# Patient Record
Sex: Female | Born: 1940
Health system: Southern US, Community
[De-identification: ages and names within clinical notes are randomized; demographics above are authoritative.]

## PROBLEM LIST (undated history)

## (undated) DIAGNOSIS — E119 Type 2 diabetes mellitus without complications: Secondary | ICD-10-CM

## (undated) DIAGNOSIS — M519 Unspecified thoracic, thoracolumbar and lumbosacral intervertebral disc disorder: Secondary | ICD-10-CM

## (undated) DIAGNOSIS — I5030 Unspecified diastolic (congestive) heart failure: Secondary | ICD-10-CM

## (undated) DIAGNOSIS — I4892 Unspecified atrial flutter: Secondary | ICD-10-CM

## (undated) DIAGNOSIS — I2510 Atherosclerotic heart disease of native coronary artery without angina pectoris: Secondary | ICD-10-CM

## (undated) DIAGNOSIS — E785 Hyperlipidemia, unspecified: Secondary | ICD-10-CM

## (undated) DIAGNOSIS — I10 Essential (primary) hypertension: Secondary | ICD-10-CM

## (undated) DIAGNOSIS — I214 Non-ST elevation (NSTEMI) myocardial infarction: Secondary | ICD-10-CM

## (undated) HISTORY — DX: Type 2 diabetes mellitus without complications: E11.9

## (undated) HISTORY — PX: OTHER SURGICAL HISTORY: SHX169

---

## 1998-05-16 ENCOUNTER — Emergency Department (HOSPITAL_COMMUNITY): Admission: EM | Admit: 1998-05-16 | Discharge: 1998-05-16 | Payer: Self-pay

## 1999-09-13 ENCOUNTER — Encounter: Payer: Self-pay | Admitting: Internal Medicine

## 1999-09-13 ENCOUNTER — Ambulatory Visit (HOSPITAL_COMMUNITY): Admission: RE | Admit: 1999-09-13 | Discharge: 1999-09-13 | Payer: Self-pay | Admitting: Internal Medicine

## 1999-11-06 ENCOUNTER — Encounter: Payer: Self-pay | Admitting: Emergency Medicine

## 1999-11-06 ENCOUNTER — Emergency Department (HOSPITAL_COMMUNITY): Admission: EM | Admit: 1999-11-06 | Discharge: 1999-11-06 | Payer: Self-pay | Admitting: Emergency Medicine

## 2002-08-20 ENCOUNTER — Emergency Department (HOSPITAL_COMMUNITY): Admission: EM | Admit: 2002-08-20 | Discharge: 2002-08-20 | Payer: Self-pay | Admitting: *Deleted

## 2002-09-03 ENCOUNTER — Ambulatory Visit (HOSPITAL_COMMUNITY): Admission: RE | Admit: 2002-09-03 | Discharge: 2002-09-03 | Payer: Self-pay | Admitting: *Deleted

## 2002-09-03 ENCOUNTER — Encounter: Payer: Self-pay | Admitting: *Deleted

## 2004-05-12 ENCOUNTER — Emergency Department (HOSPITAL_COMMUNITY): Admission: EM | Admit: 2004-05-12 | Discharge: 2004-05-12 | Payer: Self-pay | Admitting: Family Medicine

## 2010-04-13 ENCOUNTER — Emergency Department (HOSPITAL_COMMUNITY): Admission: EM | Admit: 2010-04-13 | Discharge: 2010-04-14 | Payer: Self-pay | Admitting: Emergency Medicine

## 2010-11-09 ENCOUNTER — Other Ambulatory Visit: Payer: Self-pay | Admitting: Pulmonary Disease

## 2010-11-09 ENCOUNTER — Other Ambulatory Visit: Payer: Self-pay | Admitting: *Deleted

## 2010-11-09 DIAGNOSIS — Z1231 Encounter for screening mammogram for malignant neoplasm of breast: Secondary | ICD-10-CM

## 2010-11-15 ENCOUNTER — Ambulatory Visit
Admission: RE | Admit: 2010-11-15 | Discharge: 2010-11-15 | Disposition: A | Discharge status: Home / Self Care | Payer: Medicare PPO | Source: Ambulatory Visit | Attending: *Deleted | Admitting: *Deleted

## 2010-11-15 DIAGNOSIS — Z1231 Encounter for screening mammogram for malignant neoplasm of breast: Secondary | ICD-10-CM

## 2010-11-18 ENCOUNTER — Other Ambulatory Visit: Payer: Self-pay | Admitting: Pulmonary Disease

## 2010-11-18 DIAGNOSIS — R928 Other abnormal and inconclusive findings on diagnostic imaging of breast: Secondary | ICD-10-CM

## 2010-11-30 ENCOUNTER — Ambulatory Visit
Admission: RE | Admit: 2010-11-30 | Discharge: 2010-11-30 | Disposition: A | Discharge status: Home / Self Care | Payer: Medicare PPO | Source: Ambulatory Visit | Attending: Pulmonary Disease | Admitting: Pulmonary Disease

## 2010-11-30 DIAGNOSIS — R928 Other abnormal and inconclusive findings on diagnostic imaging of breast: Secondary | ICD-10-CM

## 2011-05-02 ENCOUNTER — Other Ambulatory Visit: Payer: Self-pay

## 2011-05-02 ENCOUNTER — Emergency Department (HOSPITAL_COMMUNITY): Payer: Medicare HMO

## 2011-05-02 ENCOUNTER — Inpatient Hospital Stay (HOSPITAL_COMMUNITY)
Admission: EM | Admit: 2011-05-02 | Discharge: 2011-05-04 | DRG: 281 | Disposition: A | Discharge status: Home / Self Care | Payer: Medicare HMO | Attending: Cardiology | Admitting: Cardiology

## 2011-05-02 ENCOUNTER — Encounter: Payer: Self-pay | Admitting: *Deleted

## 2011-05-02 DIAGNOSIS — M519 Unspecified thoracic, thoracolumbar and lumbosacral intervertebral disc disorder: Secondary | ICD-10-CM | POA: Insufficient documentation

## 2011-05-02 DIAGNOSIS — I2510 Atherosclerotic heart disease of native coronary artery without angina pectoris: Secondary | ICD-10-CM

## 2011-05-02 DIAGNOSIS — I10 Essential (primary) hypertension: Secondary | ICD-10-CM

## 2011-05-02 DIAGNOSIS — Z6841 Body Mass Index (BMI) 40.0 and over, adult: Secondary | ICD-10-CM

## 2011-05-02 DIAGNOSIS — Z23 Encounter for immunization: Secondary | ICD-10-CM

## 2011-05-02 DIAGNOSIS — I119 Hypertensive heart disease without heart failure: Secondary | ICD-10-CM | POA: Diagnosis present

## 2011-05-02 DIAGNOSIS — E785 Hyperlipidemia, unspecified: Secondary | ICD-10-CM | POA: Insufficient documentation

## 2011-05-02 DIAGNOSIS — Z79899 Other long term (current) drug therapy: Secondary | ICD-10-CM

## 2011-05-02 DIAGNOSIS — Z880 Allergy status to penicillin: Secondary | ICD-10-CM

## 2011-05-02 DIAGNOSIS — Z823 Family history of stroke: Secondary | ICD-10-CM

## 2011-05-02 DIAGNOSIS — K219 Gastro-esophageal reflux disease without esophagitis: Secondary | ICD-10-CM | POA: Diagnosis present

## 2011-05-02 DIAGNOSIS — I2582 Chronic total occlusion of coronary artery: Secondary | ICD-10-CM | POA: Diagnosis present

## 2011-05-02 DIAGNOSIS — I214 Non-ST elevation (NSTEMI) myocardial infarction: Secondary | ICD-10-CM

## 2011-05-02 DIAGNOSIS — Z9119 Patient's noncompliance with other medical treatment and regimen: Secondary | ICD-10-CM

## 2011-05-02 DIAGNOSIS — Z7982 Long term (current) use of aspirin: Secondary | ICD-10-CM

## 2011-05-02 DIAGNOSIS — Z91199 Patient's noncompliance with other medical treatment and regimen due to unspecified reason: Secondary | ICD-10-CM

## 2011-05-02 DIAGNOSIS — Z87891 Personal history of nicotine dependence: Secondary | ICD-10-CM

## 2011-05-02 DIAGNOSIS — E66813 Obesity, class 3: Secondary | ICD-10-CM

## 2011-05-02 HISTORY — DX: Essential (primary) hypertension: I10

## 2011-05-02 HISTORY — DX: Morbid (severe) obesity due to excess calories: E66.01

## 2011-05-02 HISTORY — DX: Non-ST elevation (NSTEMI) myocardial infarction: I21.4

## 2011-05-02 HISTORY — DX: Hyperlipidemia, unspecified: E78.5

## 2011-05-02 HISTORY — DX: Atherosclerotic heart disease of native coronary artery without angina pectoris: I25.10

## 2011-05-02 HISTORY — DX: Unspecified thoracic, thoracolumbar and lumbosacral intervertebral disc disorder: M51.9

## 2011-05-02 HISTORY — DX: Obesity, class 3: E66.813

## 2011-05-02 LAB — DIFFERENTIAL
Basophils Relative: 1 % (ref 0–1)
Eosinophils Absolute: 0.2 10*3/uL (ref 0.0–0.7)
Monocytes Relative: 10 % (ref 3–12)
Neutrophils Relative %: 66 % (ref 43–77)

## 2011-05-02 LAB — CBC
MCH: 29.4 pg (ref 26.0–34.0)
MCHC: 32.5 g/dL (ref 30.0–36.0)
Platelets: 356 10*3/uL (ref 150–400)

## 2011-05-02 LAB — APTT: aPTT: 34 seconds (ref 24–37)

## 2011-05-02 LAB — TROPONIN I
Troponin I: 2.25 ng/mL (ref <0.30)
Troponin I: 2.2 ng/mL (ref <0.30)

## 2011-05-02 LAB — PRO B NATRIURETIC PEPTIDE: Pro B Natriuretic peptide (BNP): 1282 pg/mL — ABNORMAL HIGH (ref 0–125)

## 2011-05-02 LAB — POCT I-STAT TROPONIN I

## 2011-05-02 LAB — MRSA PCR SCREENING: MRSA by PCR: NEGATIVE

## 2011-05-02 LAB — POCT I-STAT, CHEM 8
Chloride: 106 mEq/L (ref 96–112)
HCT: 39 % (ref 36.0–46.0)
Potassium: 6.3 mEq/L (ref 3.5–5.1)

## 2011-05-02 LAB — LIPASE, BLOOD: Lipase: 11 U/L (ref 11–59)

## 2011-05-02 MED ORDER — AMLODIPINE BESYLATE 5 MG PO TABS
5.0000 mg | ORAL_TABLET | Freq: Every day | ORAL | Status: DC
  Administered 2011-05-03: 5 mg via ORAL
  Filled 2011-05-02: qty 1

## 2011-05-02 MED ORDER — ASPIRIN 81 MG PO CHEW
324.0000 mg | CHEWABLE_TABLET | ORAL | Status: AC
Start: 2011-05-02 — End: 2011-05-02
  Administered 2011-05-02: 324 mg via ORAL

## 2011-05-02 MED ORDER — HYDROCHLOROTHIAZIDE 12.5 MG PO CAPS
12.5000 mg | ORAL_CAPSULE | Freq: Once | ORAL | Status: DC
  Filled 2011-05-02: qty 1

## 2011-05-02 MED ORDER — NITROGLYCERIN 0.4 MG SL SUBL
SUBLINGUAL_TABLET | SUBLINGUAL | Status: AC
  Administered 2011-05-02: 17:00:00 via SUBLINGUAL
  Filled 2011-05-02: qty 25

## 2011-05-02 MED ORDER — OLMESARTAN MEDOXOMIL 40 MG PO TABS
40.0000 mg | ORAL_TABLET | Freq: Every day | ORAL | Status: DC
  Filled 2011-05-02: qty 1

## 2011-05-02 MED ORDER — ONDANSETRON HCL 4 MG/2ML IJ SOLN: 4.0000 mg | Freq: Four times a day (QID) | INTRAMUSCULAR | Status: DC | PRN

## 2011-05-02 MED ORDER — DIAZEPAM 5 MG PO TABS
10.0000 mg | ORAL_TABLET | ORAL | Status: AC
  Administered 2011-05-03: 10 mg via ORAL
  Filled 2011-05-02: qty 2

## 2011-05-02 MED ORDER — GI COCKTAIL ~~LOC~~
30.0000 mL | Freq: Once | ORAL | Status: AC
  Administered 2011-05-02: 30 mL via ORAL
  Filled 2011-05-02: qty 30

## 2011-05-02 MED ORDER — NITROGLYCERIN IN D5W 200-5 MCG/ML-% IV SOLN
5.0000 ug/min | INTRAVENOUS | Status: DC
  Administered 2011-05-02: 5 ug/min via INTRAVENOUS
  Filled 2011-05-02: qty 250

## 2011-05-02 MED ORDER — NITROGLYCERIN 0.4 MG SL SUBL
0.4000 mg | SUBLINGUAL_TABLET | SUBLINGUAL | Status: DC | PRN
  Administered 2011-05-02 (×2): 0.4 mg via SUBLINGUAL

## 2011-05-02 MED ORDER — ASPIRIN EC 81 MG PO TBEC
81.0000 mg | DELAYED_RELEASE_TABLET | Freq: Every day | ORAL | Status: DC
  Administered 2011-05-04: 81 mg via ORAL
  Filled 2011-05-02 (×2): qty 1

## 2011-05-02 MED ORDER — HYDROCHLOROTHIAZIDE 25 MG PO TABS
25.0000 mg | ORAL_TABLET | Freq: Every day | ORAL | Status: DC
  Administered 2011-05-04: 25 mg via ORAL
  Filled 2011-05-02 (×2): qty 1

## 2011-05-02 MED ORDER — ASPIRIN 81 MG PO CHEW
324.0000 mg | CHEWABLE_TABLET | Freq: Once | ORAL | Status: AC
  Administered 2011-05-02: 243 mg via ORAL
  Filled 2011-05-02: qty 4

## 2011-05-02 MED ORDER — METOPROLOL TARTRATE 50 MG PO TABS
50.0000 mg | ORAL_TABLET | Freq: Two times a day (BID) | ORAL | Status: DC
  Administered 2011-05-02 – 2011-05-04 (×4): 50 mg via ORAL
  Filled 2011-05-02 (×6): qty 1

## 2011-05-02 MED ORDER — ACETAMINOPHEN 325 MG PO TABS: 650.0000 mg | ORAL_TABLET | ORAL | Status: DC | PRN

## 2011-05-02 MED ORDER — NITROGLYCERIN 0.4 MG SL SUBL: 0.4000 mg | SUBLINGUAL_TABLET | SUBLINGUAL | Status: DC | PRN

## 2011-05-02 MED ORDER — HEPARIN (PORCINE) IN NACL 100-0.45 UNIT/ML-% IJ SOLN
1600.0000 [IU]/h | INTRAMUSCULAR | Status: DC
  Administered 2011-05-02: 1200 [IU]/h via INTRAVENOUS
  Administered 2011-05-03: 1600 [IU]/h via INTRAVENOUS
  Filled 2011-05-02 (×4): qty 250

## 2011-05-02 MED ORDER — ASPIRIN 300 MG RE SUPP
300.0000 mg | RECTAL | Status: AC
  Filled 2011-05-02: qty 1

## 2011-05-02 MED ORDER — LABETALOL HCL 5 MG/ML IV SOLN
10.0000 mg | Freq: Once | INTRAVENOUS | Status: DC
  Filled 2011-05-02: qty 4

## 2011-05-02 MED ORDER — SODIUM CHLORIDE 0.9 % IV SOLN: INTRAVENOUS | Status: DC

## 2011-05-02 MED ORDER — ASPIRIN 81 MG PO CHEW
324.0000 mg | CHEWABLE_TABLET | ORAL | Status: AC
  Administered 2011-05-03: 324 mg via ORAL
  Filled 2011-05-02: qty 4

## 2011-05-02 MED ORDER — HEPARIN BOLUS VIA INFUSION
4000.0000 [IU] | INTRAVENOUS | Status: AC
  Administered 2011-05-02: 4000 [IU] via INTRAVENOUS
  Filled 2011-05-02: qty 4000

## 2011-05-02 MED ORDER — ROSUVASTATIN CALCIUM 20 MG PO TABS
20.0000 mg | ORAL_TABLET | Freq: Every day | ORAL | Status: DC
  Administered 2011-05-03 – 2011-05-04 (×2): 20 mg via ORAL
  Filled 2011-05-02 (×2): qty 1

## 2011-05-02 MED ORDER — AMLODIPINE BESYLATE 5 MG PO TABS
5.0000 mg | ORAL_TABLET | Freq: Once | ORAL | Status: DC
  Filled 2011-05-02: qty 1

## 2011-05-02 NOTE — ED Notes (Signed)
Patient stable.  Calling [facility] and [facility] and giving report.  Family at bedside and informed of which room 2604 the patient will be going to at [facility].  They state understanding.

## 2011-05-02 NOTE — ED Notes (Signed)
Pt states after she eats or ambulate around the house she begins feel  Nauseated and burning from her abdomin to her throat. Pt denies sob or chest pain

## 2011-05-02 NOTE — ED Notes (Signed)
2 nitroglycerin SL given. Dr. Alto Denver ordered to hold drip as of now since BP is 174/66 and has come down significantly.

## 2011-05-02 NOTE — ED Notes (Signed)
Patient in room awaiting a bed at [facility] for report to called to and for [facility]link to be called to come pick up patient and transport the patient.

## 2011-05-02 NOTE — H&P (Addendum)
Admit date: 05/02/2011  Referring Physician: [facility]  Primary Cardiologist: Dr. Viann Fish  Chief complaint/reason for admission: Chest pain and uncontrolled hypertension  HPI:   This 70 year old female has a long-standing history of hypertensive heart disease but has been noncompliant with her medication. She is now seeing Dr. Petra Kuba but has not taken her medicines and over one week. She presented to the emergency room with a several day history of mid sternal chest discomfort that radiates up into her neck. It is described as pressure and is typically relieved with rest. He usually will occur with activity or with exertion but did occur at rest last evening. She denies PND, orthopnea, syncope, or claudication. She quit smoking about one month ago. She was seen in the emergency room and had significant inferolateral T wave inversions with some mild ST elevation and also had positive troponins. She was previously treated with Tribenzor for BP but for unclear reasons stopped taking it last week.   Past Medical History  Diagnosis Date  . Hypertension   . Non-ST elevation myocardial infarction (NSTEMI), initial episode of care 05/02/2011  . Obesity (BMI 30-39.9) 05/02/2011  . Hyperlipidemia   . Lumbar disc disease     Past Surgical History  Procedure Date  . None   .  Allergies: is allergic to penicillins.   Family history:   family history includes Cancer in her father and Stroke in her mother.   Social history:  Reports that she quit smoking about 4 weeks ago. Her smoking use included Cigarettes. She has a 25 pack-year smoking history. She has never used smokeless tobacco. She reports that she does not drink alcohol or use illicit drugs. She has been divorced and was been remarried for 18 years. She has a daughter that lives with her currently. She is retired from the [employer].    Review of Systems:  She has been obese for the past several  years. She complains of difficulty with a sty  involving her left eye. She thought that she had reflux earlier but has not had any GI problems recently. She denies constipation. She has significant urinary frequency. She has severe low back pain and has been diagnosed with lumbar disease. She also complains of pain involving both knees and has been using ibuprofen. No history of stroke.   Other than as noted above the remaining review of systems is unremarkable.   Physical Exam: Blood pressure 187/68, pulse 70, temperature 98.2 F (36.8 C), temperature source Oral, resp. rate 17, weight 120.203 kg (265 lb), SpO2 95.00%.    General Appearance:  pleasant obese black female who is currently in no acute distress   Head:    Normocephalic, without obvious abnormality, atraumatic  Eyes:    PERRL, conjunctiva/corneas clear, EOM's intact, fundi    benign, both eyes       Nose:   Nares normal, septum midline, mucosa normal, no drainage   or sinus tenderness  Throat:   Lips, mucosa, and tongue normal; teeth and gums normal  Neck:   Supple,no masses, no thyromegaly, no JVD, no carotid    bruits or JVD  Lungs:     Clear to auscultation bilaterally, respirations unlabored  Chest wall:    No tenderness or deformity  Heart:    Regular rate and rhythm, S1 and S2 normal, no murmur, rub   or gallop  Abdomen:     Soft, non-tender, bowel sounds normal,    no masses, no organomegaly  no aneurysm noted  Rectal:    Normal tone, normal prostate, no masses or tenderness;   guaiac negative stool  Extremities:   Extremities normal, atraumatic,  1+ edema   Pulses:   Femoral and distal pulses 2+, No bruits  Skin:   Skin color, texture, turgor normal, no rashes or lesions  Lymph nodes:   Cervical, supraclavicular, and axillary nodes normal  Neurologic:   CNII-XII intact. Normal strength, sensation and reflexes      throughout   Labs:  Lab Results  Component Value Date   WBC 9.3 05/02/2011   HGB 12.3 05/02/2011    HCT 37.9 05/02/2011   MCV 90.5 05/02/2011   PLT 356 05/02/2011    Lab 05/02/11 1630 05/02/11 1557  NA -- 140  K 4.7 --  CL -- 106  CO2 -- --  BUN -- 26*  CREATININE -- 1.00  CALCIUM -- --  PROT -- --  BILITOT -- --  ALKPHOS -- --  ALT -- --  AST -- --  GLUCOSE -- 102*   Lab Results  Component Value Date   TROPONINI 2.20* 05/02/2011    EKG   she is in sinus rhythm and has significant T wave inversions in the inferolateral leads with voltage criteria for LVH.  ASSESSMENT AND PLAN:  1. Non-ST elevation myocardial infarction (NSTEMI), initial episode of care (05/02/2011)      The patient has a suggestive history of unstable angina and has EKG changes as well as abnormal troponins. She will need to be admitted to the hospital and started on intravenous heparin. Because of the abnormal troponins, she will need to undergo cardiac catheterization 2. Hypertensive heart disease without CHF   Assessment: Her blood pressure is currently uncontrolled and she may have difficulty with her blood pressure because of the nonsteroidal anti-inflammatories. She will be restarted on her blood pressure medications. 3. Obesity 4. Lumbar disc disease 5. Noncompliance  RECOMMENDATIONS  Patient will be transferred to [facility] to the step down unit. She will be placed on intravenous heparin and begun on intravenous nitroglycerin. She will have serial cardiac enzymes. Plan cardiac catheterization tomorrow. Cardiac catheterization was discussed with the patient fully including risks of myocardial infarction, death, stroke, bleeding, arrhythmia, dye allergy, renal insufficiency or bleeding.  The patient understands and is willing to proceed.    Signed: Darden Palmer. MD [facility] 05/02/2011, 7:35 PM    Patient seen and examined.  No interval change in history and exam since yesterday.  Stable for procedure.  Darden Palmer. MD [facility]  05/03/2011

## 2011-05-02 NOTE — ED Notes (Signed)
Heparin bolus and gtt verified with carneal rn

## 2011-05-02 NOTE — ED Notes (Signed)
Cardiology saw the patient at bedside.

## 2011-05-02 NOTE — ED Notes (Signed)
Report called to [facility] and [facility].   Both state understanding of patient.  Patient stable and in room with family at this time.

## 2011-05-02 NOTE — ED Notes (Signed)
Nitro titrated to per min/ per Dr. Alto Denver after seeing 195/74 BP. In NAD and still in no pain at this time.

## 2011-05-02 NOTE — ED Provider Notes (Signed)
History     CSN: [id number] Arrival date & time: 05/02/2011 12:46 PM   First MD Initiated Contact with Patient 05/02/11 1458      Chief Complaint  Patient presents with  . Gastrophageal Reflux    (Consider location/radiation/quality/duration/timing/severity/associated sxs/prior treatment) HPI The patient is a 70 year old female who presents today complaining of having two-minute episodes of epigastric discomfort that radiates to her chest. These are associated with lightheadedness. They resolve with rest. The patient has been having these for the past week. Her blood pressure is elevated to 200 systolic on presentation. She states she has not been taking her blood pressure medication for least one week. The patient is a former smoker and has history of hypertension but denies any history of diabetes or prior coronary artery disease. She has no pain and is not experiencing any symptoms at this time. She has not seen her regular doctor for this. She denies any nausea or vomiting. She has not had a recent cough or fevers. Patient has no history of blood clots. She thought that this was likely indigestion but as it kept happening she thought she should get it checked out. Past Medical History  Diagnosis Date  . Hypertension     History reviewed. No pertinent past surgical history.  Family History  Problem Relation Age of Onset  . Stroke Mother   . Cancer Father     History  Substance Use Topics  . Smoking status: Former Games developer  . Smokeless tobacco: Not on file  . Alcohol Use: No    OB History    Grav Para Term Preterm Abortions TAB SAB Ect Mult Living                  Review of Systems  Constitutional: Negative.   HENT: Negative.   Respiratory: Negative.   Cardiovascular: Negative.   Gastrointestinal: Positive for abdominal pain.  Genitourinary: Negative.   Musculoskeletal: Negative.   Skin: Negative.   Neurological: Positive for light-headedness.  Hematological:  Negative.   Psychiatric/Behavioral: Negative.   All other systems reviewed and are negative.    Allergies  Penicillins  Home Medications   Current Outpatient Rx  Name Route Sig Dispense Refill  . IBUPROFEN 200 MG PO TABS Oral Take 400 mg by mouth 2 (two) times daily as needed. For pain     . NAPROXEN SODIUM 220 MG PO TABS Oral Take 440 mg by mouth 2 (two) times daily as needed.      Marland Kitchen NIACIN (ANTIHYPERLIPIDEMIC) 500 MG PO TBCR Oral Take 500 mg by mouth at bedtime.      Marland Kitchen OLMESARTAN-AMLODIPINE-HCTZ 40-5-12.5 MG PO TABS Oral Take 1 tablet by mouth daily.        BP 187/68  Pulse 70  Temp(Src) 98.2 F (36.8 C) (Oral)  Resp 17  Wt 265 lb (120.203 kg)  SpO2 95%  Physical Exam  Nursing note and vitals reviewed. Constitutional: She is oriented to person, place, and time. She appears well-developed and well-nourished. No distress.  HENT:  Head: Normocephalic and atraumatic.  Eyes: Conjunctivae and EOM are normal. Pupils are equal, round, and reactive to light.  Neck: Normal range of motion.  Cardiovascular: Normal rate, regular rhythm, normal heart sounds and intact distal pulses.  Exam reveals no gallop and no friction rub.   No murmur heard. Pulmonary/Chest: Effort normal. No respiratory distress. She has no wheezes. She has no rales.  Abdominal: Soft. Bowel sounds are normal. She exhibits no distension. There is no tenderness.  There is no rebound and no guarding.  Musculoskeletal: Normal range of motion. She exhibits no edema and no tenderness.  Neurological: She is alert and oriented to person, place, and time. No cranial nerve deficit. She exhibits normal muscle tone. Coordination normal.  Skin: Skin is warm and dry. No rash noted. She is not diaphoretic.  Psychiatric: She has a normal mood and affect.    ED Course  Procedures (including critical care time)   Date: 05/02/2011  Rate: 73  Rhythm: normal sinus rhythm  QRS Axis: normal  Intervals: normal  ST/T Wave  abnormalities: Patient had minimal ST depression noted in 1 and aVL, there was very minimal ST elevation in leads V2 V3 and aVF that appeared consistent with repolarization.  Conduction Disutrbances:none  Narrative Interpretation:   Old EKG Reviewed: none available  Labs Reviewed  POCT I-STAT, CHEM 8 - Abnormal; Notable for the following:    Potassium 6.3 (*)    BUN 26 (*)    Glucose, Bld 102 (*)    Calcium, Ion 1.07 (*)    All other components within normal limits  POCT I-STAT TROPONIN I - Abnormal; Notable for the following:    Troponin i, poc 1.70 (*)    All other components within normal limits  PRO B NATRIURETIC PEPTIDE - Abnormal; Notable for the following:    BNP, POC 1282.0 (*)    All other components within normal limits  TROPONIN I - Abnormal; Notable for the following:    Troponin I 2.25 (*)    All other components within normal limits  LIPASE, BLOOD  POTASSIUM  CBC  DIFFERENTIAL  URINALYSIS, ROUTINE W REFLEX MICROSCOPIC  URINE CULTURE  I-STAT, CHEM 8  I-STAT TROPONIN I  PROTIME-INR  APTT  HEPARIN LEVEL (UNFRACTIONATED)   Dg Chest 2 View  05/02/2011  *RADIOLOGY REPORT*  Clinical Data: Epigastric pain  CHEST - 2 VIEW  Comparison: None available  Findings: Mild cardiac enlargement with central vascular congestion.  Streaky left midlung atelectasis versus scarring. Negative for pneumonia, collapse, consolidation, effusion or pneumothorax.  Trachea is midline.  Atherosclerosis of the aorta and degenerative changes of the spine.  IMPRESSION: Cardiomegaly with vascular congestion.  Left midlung atelectasis / scarring.  Original Report Authenticated By: Judie Petit. Ruel Favors, M.D.     1. NSTEMI (non-ST elevated myocardial infarction)       MDM   Patient was evaluated by myself. She was chest pain-free on my valuation. Patient felt she was likely having indigestion. Given her age and history of smoking as well as her description of her symptoms and hypertension the patient  did have an i-STAT as well as a troponin and EKG. Patient did not have any anemia. Her initial i-STAT was remarkable for a potassium of 6.3 which was repeated in the lab and only 4.7. Patient did have a troponin of 1.7. EKG was concerning because of her ST depression as well as borderline ST elevation. I spoke to Dr. Riley Kill who is the interventional list on call. He reviewed her EKG and felt that patient was fine to continue her care here [facility]. Patient remained asymptomatic. She was given sublingual nitroglycerin and a drip was ordered. Patient received aspirin by mouth. Patient's blood pressure was reduced to a systolic of 170s given patient's initial presenting blood pressure of 200 systolic. Patient had received a GI cocktail for her symptoms as well. I spoke with Dr. Tresa Endo regarding the patient's results. Her BNP was elevated to 1282 and her chest x-ray  was concerning for some pulmonary edema. Dr. Tresa Endo recommended transfer to [facility]. The patient will be transferred to [facility] to step down bed for further management.       Cyndra Numbers, MD 05/02/11 [phone]

## 2011-05-02 NOTE — Progress Notes (Signed)
ANTICOAGULATION CONSULT NOTE - Initial Consult  Pharmacy Consult for Heparin  Indication: ACS/STEMI   Allergies  Allergen Reactions  . Penicillins Itching and Rash    Patient Measurements: Weight: 265 lb (120.203 kg)  Vital Signs: Temp: 98.2 F (36.8 C) (11/19 1654) Temp src: Oral (11/19 1654) BP: 187/68 mmHg (11/19 1654) Pulse Rate: 70  (11/19 1654)  Labs:  Basename 05/02/11 1630 05/02/11 1557  HGB 12.3 13.3  HCT 37.9 39.0  PLT 356 --  APTT -- --  LABPROT -- --  INR -- --  HEPARINUNFRC -- --  CREATININE -- 1.00  CKTOTAL -- --  CKMB -- --  TROPONINI 2.25* --   CrCl is unknown because there is no height on file for the current visit.  Medical History: Past Medical History  Diagnosis Date  . Hypertension     Medications:  Scheduled:    . aspirin  324 mg Oral Once  . gi cocktail  30 mL Oral Once  . nitroGLYCERIN      . DISCONTD: amLODipine  5 mg Oral Once  . DISCONTD: hydrochlorothiazide  12.5 mg Oral Once  . DISCONTD: labetalol  10 mg Intravenous Once  . DISCONTD: olmesartan  40 mg Oral Daily   Infusions:    . nitroGLYCERIN 5 mcg/min (05/02/11 1746)    Assessment:  70 YOF to start heparin gtt per pharmacy for ACS/STEMI.  No baseline PT/INR, PTT.  CBC wnl.  Troponin 2.25, now 1.70  Actual weight = 120.2 kg   Ideal body weight = 57 kg  Heparin weight = 86 kg    Goal of Therapy:  Heparin level of 0.3 - 0.7   Plan:   Obtain baseline PT/INR, PTT  Heparin 4000 units IV x 1 as bolus then  Heparin 1200 units/hr  Heparin level after 6 hours   Geoffry Paradise Thi 05/02/2011,6:14 PM

## 2011-05-02 NOTE — ED Notes (Signed)
Dr Alto Denver notified of abnormal lab values. K 6.3 and troponin 1.70.

## 2011-05-02 NOTE — ED Notes (Signed)
Calling report to [facility].

## 2011-05-03 ENCOUNTER — Encounter (HOSPITAL_COMMUNITY)
Admission: EM | Disposition: A | Discharge status: Home / Self Care | Payer: Self-pay | Source: Home / Self Care | Attending: Cardiology

## 2011-05-03 ENCOUNTER — Encounter (HOSPITAL_COMMUNITY): Payer: Self-pay | Admitting: Cardiology

## 2011-05-03 DIAGNOSIS — I2510 Atherosclerotic heart disease of native coronary artery without angina pectoris: Secondary | ICD-10-CM

## 2011-05-03 HISTORY — PX: LEFT HEART CATHETERIZATION WITH CORONARY ANGIOGRAM: SHX5451

## 2011-05-03 LAB — DIFFERENTIAL
Basophils Absolute: 0 10*3/uL (ref 0.0–0.1)
Basophils Relative: 1 % (ref 0–1)
Eosinophils Absolute: 0.2 10*3/uL (ref 0.0–0.7)
Monocytes Absolute: 0.5 10*3/uL (ref 0.1–1.0)
Monocytes Relative: 6 % (ref 3–12)
Neutro Abs: 5.6 10*3/uL (ref 1.7–7.7)
Neutrophils Relative %: 68 % (ref 43–77)

## 2011-05-03 LAB — COMPREHENSIVE METABOLIC PANEL
Albumin: 3.4 g/dL — ABNORMAL LOW (ref 3.5–5.2)
Alkaline Phosphatase: 86 U/L (ref 39–117)
BUN: 16 mg/dL (ref 6–23)
Calcium: 9.3 mg/dL (ref 8.4–10.5)
GFR calc Af Amer: 81 mL/min — ABNORMAL LOW (ref >90)
Glucose, Bld: 137 mg/dL — ABNORMAL HIGH (ref 70–99)
Potassium: 3.7 mEq/L (ref 3.5–5.1)
Total Protein: 8.1 g/dL (ref 6.0–8.3)

## 2011-05-03 LAB — CARDIAC PANEL(CRET KIN+CKTOT+MB+TROPI)
CK, MB: 3.8 ng/mL (ref 0.3–4.0)
Relative Index: 2.2 (ref 0.0–2.5)
Troponin I: 2.35 ng/mL (ref <0.30)

## 2011-05-03 LAB — LIPID PANEL
Cholesterol: 168 mg/dL (ref 0–200)
HDL: 17 mg/dL — ABNORMAL LOW (ref >39)
Total CHOL/HDL Ratio: 9.9 RATIO
Triglycerides: 67 mg/dL (ref <150)
VLDL: 13 mg/dL (ref 0–40)

## 2011-05-03 LAB — APTT: aPTT: 35 seconds (ref 24–37)

## 2011-05-03 LAB — PROTIME-INR: INR: 1.15 (ref 0.00–1.49)

## 2011-05-03 LAB — CBC
HCT: 37.5 % (ref 36.0–46.0)
MCH: 29.5 pg (ref 26.0–34.0)
MCHC: 32.8 g/dL (ref 30.0–36.0)
RDW: 14.8 % (ref 11.5–15.5)

## 2011-05-03 LAB — URINE CULTURE

## 2011-05-03 SURGERY — LEFT HEART CATHETERIZATION WITH CORONARY ANGIOGRAM
Anesthesia: LOCAL

## 2011-05-03 MED ORDER — HEPARIN (PORCINE) IN NACL 2-0.9 UNIT/ML-% IJ SOLN
INTRAMUSCULAR | Status: AC
  Filled 2011-05-03: qty 2000

## 2011-05-03 MED ORDER — NITROGLYCERIN 0.2 MG/ML ON CALL CATH LAB
INTRAVENOUS | Status: AC
  Filled 2011-05-03: qty 1

## 2011-05-03 MED ORDER — AMLODIPINE BESYLATE 5 MG PO TABS
5.0000 mg | ORAL_TABLET | Freq: Two times a day (BID) | ORAL | Status: DC
  Administered 2011-05-03 – 2011-05-04 (×2): 5 mg via ORAL
  Filled 2011-05-03 (×4): qty 1

## 2011-05-03 MED ORDER — LIDOCAINE HCL (PF) 1 % IJ SOLN
INTRAMUSCULAR | Status: AC
  Filled 2011-05-03: qty 30

## 2011-05-03 MED ORDER — SODIUM CHLORIDE 0.9 % IV SOLN: 1.0000 mL/kg/h | INTRAVENOUS | Status: AC

## 2011-05-03 MED ORDER — NITROGLYCERIN IN D5W 200-5 MCG/ML-% IV SOLN
2.0000 ug/min | INTRAVENOUS | Status: DC
  Filled 2011-05-03 (×2): qty 250

## 2011-05-03 MED ORDER — ZOLPIDEM TARTRATE 5 MG PO TABS: 5.0000 mg | ORAL_TABLET | Freq: Every evening | ORAL | Status: DC | PRN

## 2011-05-03 MED ORDER — ACETAMINOPHEN 325 MG PO TABS: 650.0000 mg | ORAL_TABLET | ORAL | Status: DC | PRN

## 2011-05-03 MED ORDER — LABETALOL HCL 5 MG/ML IV SOLN
INTRAVENOUS | Status: AC
  Filled 2011-05-03: qty 4

## 2011-05-03 MED ORDER — LISINOPRIL 10 MG PO TABS
10.0000 mg | ORAL_TABLET | Freq: Two times a day (BID) | ORAL | Status: DC
  Administered 2011-05-03 – 2011-05-04 (×2): 10 mg via ORAL
  Filled 2011-05-03 (×4): qty 1

## 2011-05-03 MED ORDER — ENOXAPARIN SODIUM 40 MG/0.4ML ~~LOC~~ SOLN
40.0000 mg | SUBCUTANEOUS | Status: DC
  Administered 2011-05-04: 40 mg via SUBCUTANEOUS
  Filled 2011-05-03 (×2): qty 0.4

## 2011-05-03 NOTE — Op Note (Signed)
PROCEDURE:  Left heart catheterization with selective coronary angiography, left ventriculogram.  INDICATIONS:  Recent non-ST elevation myocardial infarction, positive troponins.  The risks, benefits, and details of the procedure were explained to the patient.  The patient verbalized understanding and wanted to proceed.  Informed written consent was obtained.  PROCEDURE TECHNIQUE:  After Xylocaine anesthesia a 72F sheath was placed in the right femoral artery with a single anterior needle wall stick.   Left coronary angiography was done using a Judkins L4 guide catheter.  Right coronary angiography was done using a Judkins R4 guide catheter. The left coronary system required at least 10 cc of contrast with very high flows.  Left ventriculography was done using a pigtail catheter. She was quite hypertensive during the procedure include part labetalol 20 mg x2 as well as intravenous nitroglycerin drip. She tolerated the procedure well. She was taken to the holding area for sheath removal.   CONTRAST:  Total of 80 cc.  COMPLICATIONS:  None.    HEMODYNAMICS:  Aortic pressure was 194/87; LV pressure was 194/23-30  There was no gradient between the left ventricle and aorta.    ANGIOGRAPHIC DATA:     Coronary arteries: Arise and distribute normally. The system is right dominant. No significant coronary calcification is noted.  Left main coronary artery: Very mild irregularity distally.   Left anterior descending:. Moderate disease present in the midportion of the LAD estimated at 40-50%  Circumflex coronary artery: Moderate irregularity but no significant focal obstructive stenoses noted  Right coronary artery: Occluded after a large right ventricular branch or acute marginal artery. There is very faint antegrade flow and the distal vessel fills by left to right collaterals as well as left to left collaterals through the acute marginal artery.  LEFT VENTRICULOGRAM:  Left ventricular angiogram was  done in the 30 RAO projection. The left inch but appears mildly dilated with increased trabeculations. There is very minimal inferobasal hypokinesis noted. The estimated ejection fraction is 60%.  IMPRESSIONS:  1. 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 Moderate disease involving the left coronary system-predominantly the LAD 3. Normal left particular systolic function with left ventricular hypertrophy and increased LVEDP with minimal inferobasal hypokinesis  RECOMMENDATION:  The patient will need to have intensive risk factor modification with good blood pressure control, cigarette smoking cessation, beta blocker and ACE therapy and treatment of lipids.   Darden Palmer MD [facility]

## 2011-05-03 NOTE — Consult Note (Signed)
Pt smoked 1/2 ppd for 25 years but stopped smoking 4 weeks ago and has remained tobacco free. Says she's doing better as far as cravings now and plans to maintain. Congratulated and encouraged pt to remain tobacco free. Referred to 1-800 quit now for f/u and support. Discussed oral fixation substitutes, second hand smoke and in home smoking policy. Reviewed and gave pt Written education/contact information.

## 2011-05-03 NOTE — Brief Op Note (Signed)
05/02/2011 - 05/03/2011  5:14 PM  PATIENT:  Jamie Mason   PRE-OPERATIVE DIAGNOSIS:  Non-STEMI  POST-OPERATIVE DIAGNOSIS:  Coronary artery disease with previous inferior infarction  PROCEDURE:  Procedure(s): LEFT HEART CATHETERIZATION WITH CORONARY ANGIOGRAM  Cardiac cath done without complications:  Findings:  1. Normal left ventricular function 2. Occlusion of the right coronary artery fibrillation the left coronary system. Moderate disease involving the left coronary system with full report to follow.  SURGEON:  Surgeon(s): Darden Palmer., MD

## 2011-05-03 NOTE — Clinical Documentation Improvement (Signed)
°  Dear Doctor Lynnell Grain, Leeroy Bock or Associate     In an effort to better capture your patients severity of illness, reflect appropriate length of stay and utilization of resources, a review of the patient medical record has revealed the following indicators.   In responding to this query please exercise your independent judgment. The fact that a query is asked, does not imply that any particular answer is desired or expected.   Based on your clinical judgment, please clarify and document in a progress note and/or discharge summary the clinical condition associated with the following supporting information:    Conditions documented as possible, probable, or suspected can be coded if restated at the time of discharge. In responding to this query please exercise your independent judgment.   Potential Diagnosis:  Clinical Information:    Please identify and or classify pt weight status along with BMI thendocument it in Progress Notes and Discharge Summary. Thanks   Morbid Obesity W/ BMI=47.9  Other Condition (please specify)  Cannot Clinically Determine    Weight: 286lbs  Height: 5'5  BMI = 47.9       Please acknowledge this query as requested below and document (any) supplemental information in the progress notes and/or your discharge summary.  _x__ Reviewed: additional documentation added to the medical record  ___ Reviewed: no additional added to the medical record/unable to determine    Thank you. Please contact me if questions.    Rossie Muskrat, CDS Contact [phone]    Health Information Management [place]

## 2011-05-03 NOTE — Plan of Care (Signed)
Problem: Consults Goal: Tobacco Cessation referral if indicated Outcome: Not Applicable Date Met:  05/03/11 Patient admits history of smoking.  She states that she has quit recently. Goal: Nutrition Consult-if indicated Outcome: Adequate for Discharge I recommend education regarding healthy diet for this patient.  Problem: Phase I Progression Outcomes Goal: Voiding-avoid urinary catheter unless indicated Outcome: Not Applicable Date Met:  05/03/11 Patient is up to restroom as needed.  Supervision advised due to nitro-drip to prevent syncope episode that may cause a fall.  So far patient has been up with steady gait.

## 2011-05-03 NOTE — Progress Notes (Signed)
Received critical lab value:  Troponin = 2.44.  Patient's vital signs stable.  Patient in no distress and denies pain.  Will monitor.  Patient is to go to cath lab in the morning.

## 2011-05-04 ENCOUNTER — Other Ambulatory Visit: Payer: Self-pay

## 2011-05-04 ENCOUNTER — Encounter (HOSPITAL_COMMUNITY): Payer: Self-pay | Admitting: Cardiology

## 2011-05-04 DIAGNOSIS — I2510 Atherosclerotic heart disease of native coronary artery without angina pectoris: Secondary | ICD-10-CM

## 2011-05-04 HISTORY — DX: Atherosclerotic heart disease of native coronary artery without angina pectoris: I25.10

## 2011-05-04 MED ORDER — ASPIRIN 81 MG PO TBEC: 81.0000 mg | DELAYED_RELEASE_TABLET | Freq: Every day | ORAL | Status: AC

## 2011-05-04 MED ORDER — HYDROCHLOROTHIAZIDE 25 MG PO TABS: 25.0000 mg | ORAL_TABLET | Freq: Every day | ORAL | Status: DC

## 2011-05-04 MED ORDER — LISINOPRIL 40 MG PO TABS: 40.0000 mg | ORAL_TABLET | Freq: Every day | ORAL | Status: DC

## 2011-05-04 MED ORDER — ATENOLOL 50 MG PO TABS: 50.0000 mg | ORAL_TABLET | Freq: Every day | ORAL | Status: DC

## 2011-05-04 MED ORDER — AMLODIPINE BESYLATE 10 MG PO TABS: 10.0000 mg | ORAL_TABLET | Freq: Every day | ORAL | Status: DC

## 2011-05-04 MED ORDER — PRAVASTATIN SODIUM 40 MG PO TABS: 40.0000 mg | ORAL_TABLET | Freq: Every day | ORAL | Status: DC

## 2011-05-04 MED ORDER — NITROGLYCERIN 0.4 MG SL SUBL: 0.4000 mg | SUBLINGUAL_TABLET | SUBLINGUAL | Status: DC | PRN

## 2011-05-04 NOTE — Progress Notes (Signed)
Pt was given all D/CED orders and fu care education sheets/ home health / meds and S/S pt understood all instructions

## 2011-05-04 NOTE — Progress Notes (Signed)
  Echocardiogram 2D Echocardiogram has been performed.  Dewitt Hoes, RDCS 05/04/2011, 3:14 PM

## 2011-05-04 NOTE — Discharge Summary (Signed)
Physician Discharge Summary  Patient ID: Jamie Mason  MRN: [id number] DOB/AGE: [birth year] 68 y.o.  Admit date: 05/02/2011 Discharge date: 05/04/2011  Primary Discharge Diagnosis Non-ST elevation myocardial infarction-initial episode of care  Secondary Discharge Diagnosis 1. Hypertensive heart disease-uncontrolled 2. Coronary artery disease 3. Morbid obesity 4. Lumbar disc disease 5. Medical noncompliance  Significant Diagnostic Studies:  1. Cardiac catheterization 2. Echocardiogram  Hospital Course:   This 70 -year-old female has a long-standing history of hypertension but has been poorly compliant with treatment. She was previously seen by her primary physician and had medication samples given to her but would not take them. She has smoked previously but says that she quit one month ago. She presented to the [facility] long emergency room with substernal chest tightness that she describes as mid sternal chest discomfort that would radiate into her neck. She was found to have mild inferior ST elevation with inferolateral T wave inversions and her troponins were positive. She was transferred to [facility].  She was placed on treatment with intravenous heparin and serial troponins returned abnormal. EKG showed LVH with inferior and lateral T-wave changes. She was placed on intravenous nitroglycerin and was placed on amlodipine, HCTZ, and beta blocker therapy. She underwent cardiac catheterization the next day with findings of an occluded right coronary artery with collaterals from the left coronary system. The left anterior descending had moderate disease in the midportion. Circumflex had mild irregularities, there was mild distal left main disease. Left ventricular function was normal she had significant left ventricular hypertrophy on echocardiogram.  Her blood pressure medications were titrated and although she was still hypertensive on discharge her blood pressure had come under  much better control.  She was seen by cardiac rehabilitation and is due to followup with outpatient cardiac rehabilitation. The importance of medical compliance was discussed with her and arrangements were made for her to receive home health nursing to help with medical compliance. The importance of weight reduction and cigarette smoking cessation were also discussed with her. The importance of regular followup and compliance with her medications was also discussed with her.  Discharge Exam: Blood pressure 176/84, pulse 59, temperature 98.3 F (36.8 C), temperature source Oral, resp. rate 19, height 5\' 5"  (1.651 m), weight 130.4 kg (287 lb 7.7 oz), SpO2 99.00%.    Very large black female currently in no acute distress. Lungs were clear. Cardiac exam normal S1-S2 no S3. Catheterization site is clean and dry without hematoma or bruit.  Labs:   Lab Results  Component Value Date   WBC 8.2 05/02/2011   HGB 12.3 05/02/2011   HCT 37.5 05/02/2011   MCV 89.9 05/02/2011   PLT 336 05/02/2011    Lab 05/02/11 2338  NA 139  K 3.7  CL 103  CO2 26  BUN 16  CREATININE 0.84  CALCIUM 9.3  PROT 8.1  BILITOT 0.3  ALKPHOS 86  ALT 10  AST 25  GLUCOSE 137*   Lab Results  Component Value Date   CKTOTAL 170 05/03/2011   CKMB 3.8 05/03/2011   TROPONINI 2.35* 05/03/2011    Lab Results  Component Value Date   CHOL 168 05/03/2011   Lab Results  Component Value Date   HDL 17* 05/03/2011   Lab Results  Component Value Date   LDLCALC 138* 05/03/2011   Lab Results  Component Value Date   TRIG 67 05/03/2011   Lab Results  Component Value Date   CHOLHDL 9.9 05/03/2011     Radiology:  Chest x-ray showed cardiomegaly   FOLLOW UP PLANS AND APPOINTMENTS Discharge Orders    Future Orders Please Complete By Expires   Diet - low sodium heart healthy      Increase activity slowly      Call MD for:  temperature >100.4      Call MD for:  persistant nausea and vomiting      Call MD for:   severe uncontrolled pain      Discharge instructions      Comments:   Be sure to take your medications and get them filled at the drugstore. Follow up with cardiac rehabilitation and your primary physician also. Obtain a blood pressure cuff (Omron) and and check blood pressure every day. Ideally your blood pressure should be less than 140 on the top number.     Current Discharge Medication List    START taking these medications   Details  amLODipine (NORVASC) 10 MG tablet Take 1 tablet (10 mg total) by mouth daily. Qty: 30 tablet, Refills: 12    aspirin EC 81 MG EC tablet Take 1 tablet (81 mg total) by mouth daily.    atenolol (TENORMIN) 50 MG tablet Take 1 tablet (50 mg total) by mouth daily. Qty: 30 tablet, Refills: 12    hydrochlorothiazide (HYDRODIURIL) 25 MG tablet Take 1 tablet (25 mg total) by mouth daily. Qty: 30 tablet, Refills: 12    lisinopril (PRINIVIL,ZESTRIL) 40 MG tablet Take 1 tablet (40 mg total) by mouth daily. Qty: 30 tablet, Refills: 12    nitroGLYCERIN (NITROSTAT) 0.4 MG SL tablet Place 1 tablet (0.4 mg total) under the tongue every 5 (five) minutes as needed for chest pain. Qty: 25 tablet, Refills: 12    pravastatin (PRAVACHOL) 40 MG tablet Take 1 tablet (40 mg total) by mouth daily. Qty: 30 tablet, Refills: 12      CONTINUE these medications which have NOT CHANGED   Details  niacin (NIASPAN) 500 MG CR tablet Take 500 mg by mouth at bedtime.        STOP taking these medications     ibuprofen (ADVIL,MOTRIN) 200 MG tablet      naproxen sodium (ANAPROX) 220 MG tablet      Olmesartan-Amlodipine-HCTZ (TRIBENZOR) 40-5-12.5 MG TABS        Follow-up Information    Follow up with KILPATRICK JR,GEORGE R. Make an appointment in 2 weeks.   Contact information:   Individual [address] [place] [address] [phone]       Follow up with Darden Palmer, MD. Make an appointment in 1 week.   Contact information:   [address] [place] [address] [phone]           Signed:  Darden Palmer M.D. [facility]  05/04/2011, 4:16 PM

## 2011-05-11 ENCOUNTER — Encounter: Payer: Self-pay | Admitting: Cardiology

## 2011-05-11 ENCOUNTER — Other Ambulatory Visit: Payer: Self-pay | Admitting: Cardiology

## 2011-05-11 MED ORDER — AMLODIPINE BESYLATE 10 MG PO TABS: 10.0000 mg | ORAL_TABLET | Freq: Every day | ORAL | Status: DC

## 2014-03-19 ENCOUNTER — Ambulatory Visit (INDEPENDENT_AMBULATORY_CARE_PROVIDER_SITE_OTHER): Payer: Medicare HMO | Admitting: Internal Medicine

## 2014-03-19 ENCOUNTER — Encounter: Payer: Self-pay | Admitting: Internal Medicine

## 2014-03-19 ENCOUNTER — Ambulatory Visit (HOSPITAL_COMMUNITY)
Admission: RE | Admit: 2014-03-19 | Discharge: 2014-03-19 | Disposition: A | Discharge status: Home / Self Care | Payer: Medicare HMO | Source: Ambulatory Visit | Attending: Internal Medicine | Admitting: Internal Medicine

## 2014-03-19 VITALS — BP 215/80 | HR 87 | Temp 98.1°F | Ht 65.5 in | Wt 324.1 lb

## 2014-03-19 DIAGNOSIS — R0609 Other forms of dyspnea: Secondary | ICD-10-CM | POA: Insufficient documentation

## 2014-03-19 DIAGNOSIS — I10 Essential (primary) hypertension: Secondary | ICD-10-CM

## 2014-03-19 DIAGNOSIS — E785 Hyperlipidemia, unspecified: Secondary | ICD-10-CM

## 2014-03-19 DIAGNOSIS — R0600 Dyspnea, unspecified: Secondary | ICD-10-CM | POA: Insufficient documentation

## 2014-03-19 DIAGNOSIS — I2510 Atherosclerotic heart disease of native coronary artery without angina pectoris: Secondary | ICD-10-CM

## 2014-03-19 DIAGNOSIS — Z0000 Encounter for general adult medical examination without abnormal findings: Secondary | ICD-10-CM | POA: Insufficient documentation

## 2014-03-19 DIAGNOSIS — Z87891 Personal history of nicotine dependence: Secondary | ICD-10-CM

## 2014-03-19 DIAGNOSIS — E8881 Metabolic syndrome: Secondary | ICD-10-CM

## 2014-03-19 LAB — POCT URINALYSIS DIPSTICK
Bilirubin, UA: NEGATIVE
Glucose, UA: NEGATIVE
Ketones, UA: NEGATIVE
Leukocytes, UA: NEGATIVE
NITRITE UA: NEGATIVE
PROTEIN UA: 30
SPEC GRAV UA: 1.02
UROBILINOGEN UA: 0.2
pH, UA: 7

## 2014-03-19 MED ORDER — LISINOPRIL-HYDROCHLOROTHIAZIDE 10-12.5 MG PO TABS: 1.0000 | ORAL_TABLET | Freq: Every day | ORAL | Status: DC

## 2014-03-19 MED ORDER — PRAVASTATIN SODIUM 40 MG PO TABS: 40.0000 mg | ORAL_TABLET | Freq: Every day | ORAL | Status: DC

## 2014-03-19 MED ORDER — ASPIRIN EC 81 MG PO TBEC: 81.0000 mg | DELAYED_RELEASE_TABLET | Freq: Every day | ORAL | Status: DC

## 2014-03-19 MED ORDER — ALBUTEROL SULFATE HFA 108 (90 BASE) MCG/ACT IN AERS: 2.0000 | INHALATION_SPRAY | Freq: Four times a day (QID) | RESPIRATORY_TRACT | Status: DC | PRN

## 2014-03-19 MED ORDER — ATENOLOL 50 MG PO TABS: 50.0000 mg | ORAL_TABLET | Freq: Every day | ORAL | Status: DC

## 2014-03-19 MED ORDER — AMLODIPINE BESYLATE 10 MG PO TABS: 10.0000 mg | ORAL_TABLET | Freq: Every day | ORAL | Status: DC

## 2014-03-19 NOTE — Assessment & Plan Note (Addendum)
Assessment: Pt with obstructive CAD on cardiac catherization in 2012 noncompliant with medical therapy who presents with dyspnea on exertion without symptoms of angina.    Plan:  -Prescribe atenolol 50 mg daily and lisinopril-HCTZ 10-12.5 mg daily  -Pt instructed to start aspirin 81 mg daily  -Monitor for symptoms of angina

## 2014-03-19 NOTE — Assessment & Plan Note (Addendum)
Assessment: Pt with central obesity, uncontrolled hypertension, and history of low HDL cholesterol and impaired fasting glucose meeting criteria for metabolic syndrome.   Plan: -Obtain annual lipid panel, A1c, and POCT UA  -Pt encouraged on weight loss and diet modification -Pt encouraged to meet with Lupita Leashonna Plyler   ADDENDUM: Pt with newly diagnosed Type II DM with A1c of 6.6. Called pt and agreeable to starting metformin 500 mg BID. Pt to have foot exam, eye exam, and urine protein:Cr ratio at next visit.

## 2014-03-19 NOTE — Assessment & Plan Note (Addendum)
-  Pt declined screening mammography and colonoscopy today on 03/19/14. -Pt declined influenza vaccination and reports having had pneumonia vaccination in the past.

## 2014-03-19 NOTE — Assessment & Plan Note (Addendum)
Assessment: Pt is a past cigarette smoker with history of obstructive CAD and uncontrolled hypertension who presents with symptoms of dyspnea with exertion with no symptoms of angina.   Plan: -Obtain chest xray (last one in 2012 with cardiomegaly and pulmonary venous congestion) -Obtain pulmonary function testing in setting of past tobacco use and wheezing -Refill albuterol inhaler 2 puffs Q 6 hr PRN acute bronchospasm -Consider repeating 2D-echo (last one in 2012 with severe concentric hypertrophy without mention of diastolic dysfunction)

## 2014-03-19 NOTE — Assessment & Plan Note (Addendum)
Assessment: Pt with poorly controlled hypertension compliant with one-class (CCB) anti-hypertensive therapy who presents with asymptomatic blood pressure of 215/80.  Plan:  -BP 215/80 not at goal <140/90  -Prescribe atenolol 50 mg daily and lisinopril-HCTZ 10-12.5 mg daily -Continue amlodipine 10 mg daily  -Obtain CMP and TSH level  -Pt instructed to return in 1-2 weeks to adjust anti-hypertensive therapy

## 2014-03-19 NOTE — Progress Notes (Signed)
Patient ID: Jamie NighGloria C Bettinger, female   DOB: 10/16/1940, 73 y.o.   MRN: [id number]    Subjective:   Patient ID: Jamie Mason female   DOB: 08/02/1940 73 y.o.   MRN: [id number]  HPI: Ms.Mckyla C Fredric MareBailey is a 73 y.o. pleasant woman with past medical history of uncontrolled hypertension, hyperlipidemia, obstructive CAD, NSTEMI in 2012, and lumbar disc disease who [facility]  She reports only taking one medication which is amlodipine 10 mg daily. She has not been taking previously prescribed atenolol, HCTZ, or lisinopril. She denies headache, blurry vision, palpitations, chest pain, lightheadedness, or LE edema.   She has had chronic dyspnea with exertion and occasional wheezing with no cough which she uses albuterol inhaler as needed for. She is a previous smoker and reports quitting about 4 years ago. She has never has pulmonary function testing before. Her last 2D-echo in 2012 revealed severe concentric hypertrophy with normal EF 60-65% and no mention of diastolic dysfunction. Her last chest xray in 2012 revealed cardiomegaly and pulmonary venous congestion. She denies orthopnea, PND, or LE edema. She is not currently taking a diuretic.   She has history of hyperlipidemia and is non-compliant with taking pravastatin daily. She is also not taking aspirin.   She denies history of diabetes and reports urinary frequency without other urinary symptoms, polydipsia, or polyphagia. She reports weight gain and currently weighs 324 lbs with BMI of 53. She has gained about 40 lb in the past 3 years.     Past Medical History  Diagnosis Date  . Hypertension   . Non-ST elevation myocardial infarction (NSTEMI), initial episode of care 05/02/2011  . Hyperlipidemia   . Lumbar disc disease   . Morbid Obesity 05/02/2011  . CAD (coronary artery disease) 05/04/2011   Current Outpatient Prescriptions  Medication Sig Dispense Refill  . amLODipine (NORVASC) 10 MG tablet Take 1 tablet (10 mg  total) by mouth daily.  30 tablet  12  . atenolol (TENORMIN) 50 MG tablet Take 1 tablet (50 mg total) by mouth daily.  30 tablet  12  . hydrochlorothiazide (HYDRODIURIL) 25 MG tablet Take 1 tablet (25 mg total) by mouth daily.  30 tablet  12  . lisinopril (PRINIVIL,ZESTRIL) 40 MG tablet Take 1 tablet (40 mg total) by mouth daily.  30 tablet  12  . niacin (NIASPAN) 500 MG CR tablet Take 500 mg by mouth at bedtime.        . nitroGLYCERIN (NITROSTAT) 0.4 MG SL tablet Place 1 tablet (0.4 mg total) under the tongue every 5 (five) minutes as needed for chest pain.  25 tablet  12  . pravastatin (PRAVACHOL) 40 MG tablet Take 1 tablet (40 mg total) by mouth daily.  30 tablet  12   No current facility-administered medications for this visit.   Family History  Problem Relation Age of Onset  . Stroke Mother   . Cancer Father    History   Social History  . Marital Status: Married    Spouse Name: N/A    Number of Children: N/A  . Years of Education: N/A   Social History Main Topics  . Smoking status: Former Smoker -- 0.50 packs/day for 50 years    Types: Cigarettes    Quit date: 04/01/2011  . Smokeless tobacco: Never Used  . Alcohol Use: No  . Drug Use: No  . Sexual Activity: No   Other Topics Concern  . Not on file   Social History Narrative  .  No narrative on file   Review of Systems: Review of Systems  Constitutional: Negative for fever, chills and diaphoresis.       Weight gain  HENT: Negative for congestion and sore throat.   Eyes: Negative for blurred vision.  Respiratory: Positive for shortness of breath (with exertion) and wheezing.   Cardiovascular: Negative for chest pain, palpitations, orthopnea, leg swelling and PND.  Gastrointestinal: Negative for nausea, vomiting, abdominal pain, diarrhea, constipation and blood in stool.  Genitourinary: Positive for frequency. Negative for dysuria, urgency and hematuria.  Musculoskeletal: Positive for back pain (chronic low back ).    Neurological: Negative for dizziness and headaches.  Endo/Heme/Allergies: Negative for polydipsia.    Objective:  Physical Exam: Filed Vitals:   03/19/14 1532  BP: 215/80  Pulse: 87  Temp: 98.1 F (36.7 C)  TempSrc: Oral  Height: 5' 5.5" (1.664 m)  Weight: 324 lb 1.6 oz (147.011 kg)  SpO2: 97%    Physical Exam  Constitutional: She is oriented to person, place, and time. She appears well-developed and well-nourished. No distress.  Morbidly obese  HENT:  Head: Normocephalic and atraumatic.  Right Ear: External ear normal.  Left Ear: External ear normal.  Nose: Nose normal.  Mouth/Throat: Oropharynx is clear and moist. No oropharyngeal exudate.  Eyes: Conjunctivae and EOM are normal. Pupils are equal, round, and reactive to light. Right eye exhibits no discharge. Left eye exhibits no discharge. No scleral icterus.  Neck: Normal range of motion. Neck supple.  Cardiovascular: Normal rate and regular rhythm.   Distant heart sounds  Pulmonary/Chest: Effort normal. No respiratory distress. She has no wheezes. She has no rales.  Decreased breath sounds  Abdominal: Soft. Bowel sounds are normal. She exhibits no distension. There is no tenderness. There is no rebound and no guarding.  Musculoskeletal: Normal range of motion. She exhibits edema (trace pitting b/l LE edema ). She exhibits no tenderness.  Neurological: She is alert and oriented to person, place, and time.  Skin: Skin is warm and dry. No rash noted. She is not diaphoretic. No erythema. No pallor.  Psychiatric: She has a normal mood and affect. Her behavior is normal. Judgment and thought content normal.    Assessment & Plan:   Please see problem list for problem-based assessment and plan

## 2014-03-19 NOTE — Patient Instructions (Signed)
-  Start taking atenolol 50 mg daily and lisinopril-HCTZ 10-12.5 mg daily for high blood pressure -Start taking pravastatin 40 mg daily for your high cholesterol  -Will order a chest xray and breathing test for your shortness of breath -I refilled your albuterol, take it only as needed  -Will check your bloodwork and urine today  -Please come back in 1-2 weeks to recheck your blood pressure  General Instructions:   Thank you for bringing your medicines today. This helps us keep you safe from mistakes.   Progress Toward Treatment Goals:  No flowsheet data found.  Self Care Goals & Plans:  No flowsheet data found.  No flowsheet data found.   Care Management & Community Referrals:  No flowsheet data found.

## 2014-03-19 NOTE — Assessment & Plan Note (Signed)
Assessment: Pt with last lipid panel on 05/03/11 with hypercholesteremia and low HDL noncompliant with statin therapy who presents with 10-yr ASCVD risk of 20.1% with recommendations to initiate moderate to high intensity statin therapy.   Plan: -Obtain annual lipid panel and CMP -Prescribe pravastatin 40 mg daily  -Monitor for myalgias

## 2014-03-20 LAB — COMPLETE METABOLIC PANEL WITH GFR
ALBUMIN: 3.7 g/dL (ref 3.5–5.2)
ALK PHOS: 96 U/L (ref 39–117)
ALT: 10 U/L (ref 0–35)
AST: 18 U/L (ref 0–37)
BUN: 13 mg/dL (ref 6–23)
CALCIUM: 9.2 mg/dL (ref 8.4–10.5)
CHLORIDE: 103 meq/L (ref 96–112)
CO2: 26 mEq/L (ref 19–32)
Creat: 1.03 mg/dL (ref 0.50–1.10)
GFR, Est African American: 63 mL/min
GFR, Est Non African American: 55 mL/min — ABNORMAL LOW
Glucose, Bld: 107 mg/dL — ABNORMAL HIGH (ref 70–99)
POTASSIUM: 4.2 meq/L (ref 3.5–5.3)
Sodium: 138 mEq/L (ref 135–145)
Total Bilirubin: 0.4 mg/dL (ref 0.2–1.2)
Total Protein: 7.9 g/dL (ref 6.0–8.3)

## 2014-03-20 LAB — HEMOGLOBIN A1C
Hgb A1c MFr Bld: 6.6 % — ABNORMAL HIGH (ref <5.7)
Mean Plasma Glucose: 143 mg/dL — ABNORMAL HIGH (ref <117)

## 2014-03-20 LAB — TSH: TSH: 1.615 u[IU]/mL (ref 0.350–4.500)

## 2014-03-20 LAB — LIPID PANEL
CHOL/HDL RATIO: 6.5 ratio
CHOLESTEROL: 170 mg/dL (ref 0–200)
HDL: 26 mg/dL — ABNORMAL LOW (ref >39)
LDL CALC: 128 mg/dL — AB (ref 0–99)
TRIGLYCERIDES: 79 mg/dL (ref <150)
VLDL: 16 mg/dL (ref 0–40)

## 2014-03-22 MED ORDER — METFORMIN HCL 500 MG PO TABS: 500.0000 mg | ORAL_TABLET | Freq: Two times a day (BID) | ORAL | Status: DC

## 2014-03-22 NOTE — Addendum Note (Signed)
Addended byOtis Brace: Jamar Casagrande on: 03/22/2014 11:44 AM   Modules accepted: Orders

## 2014-03-24 NOTE — Progress Notes (Signed)
Case discussed with Dr. Rabbani soon after the resident saw the patient.  We reviewed the resident's history and exam and pertinent patient test results.  I agree with the assessment, diagnosis and plan of care documented in the resident's note. 

## 2014-04-04 ENCOUNTER — Ambulatory Visit (INDEPENDENT_AMBULATORY_CARE_PROVIDER_SITE_OTHER): Payer: Medicare HMO | Admitting: Internal Medicine

## 2014-04-04 ENCOUNTER — Encounter: Payer: Self-pay | Admitting: Internal Medicine

## 2014-04-04 VITALS — BP 169/49 | HR 52 | Temp 98.0°F | Wt 322.8 lb

## 2014-04-04 DIAGNOSIS — I10 Essential (primary) hypertension: Secondary | ICD-10-CM

## 2014-04-04 DIAGNOSIS — R0600 Dyspnea, unspecified: Secondary | ICD-10-CM

## 2014-04-04 DIAGNOSIS — Z0000 Encounter for general adult medical examination without abnormal findings: Secondary | ICD-10-CM

## 2014-04-04 DIAGNOSIS — E1122 Type 2 diabetes mellitus with diabetic chronic kidney disease: Secondary | ICD-10-CM

## 2014-04-04 DIAGNOSIS — N182 Chronic kidney disease, stage 2 (mild): Secondary | ICD-10-CM

## 2014-04-04 MED ORDER — LISINOPRIL-HYDROCHLOROTHIAZIDE 10-12.5 MG PO TABS: 2.0000 | ORAL_TABLET | Freq: Every day | ORAL | Status: DC

## 2014-04-04 NOTE — Patient Instructions (Addendum)
-  Take 2 tablets of prinzide daily instead of 1 for your blood pressure -Will check your bloodwork today -Will schedule you for a breathing test -Will see you back in 1 month, nice seeing you today!   General Instructions:   Thank you for bringing your medicines today. This helps us keep you safe from mistakes.   Progress Toward Treatment Goals:  No flowsheet data found.  Self Care Goals & Plans:  No flowsheet data found.  No flowsheet data found.   Care Management & Community Referrals:  No flowsheet data found.

## 2014-04-04 NOTE — Progress Notes (Signed)
Patient ID: Jamie Mason, female   DOB: 11/14/1940, 73 y.o.   MRN: [id number]    Subjective:   Patient ID: Jamie Mason female   DOB: 01/31/1941 73 y.o.   MRN: [id number]  HPI: [name] is a 73 y.o. . pleasant woman with past medical history of uncontrolled hypertension, hyperlipidemia, obstructive CAD, NSTEMI in 2012, lumbar disc disease, and newly diagnosed Type II DM who presents for follow-up of uncontrolled hypertension.    She reports compliance with taking atenolol, lisinopril-HCTZ, and amlodipine daily since last visit. She denies headache, blurry vision, palpitations, chest pain, lightheadedness, or LE edema.   She continues to have chronic dyspnea with exertion. She has not yet had pulmonary function testing that was ordered at last visit.  Her most recent chest xray revealed stable cardiomegaly.   She has been compliant with taking pravastatin daily for hyperlipidemia with no muscle cramping or pain. She is also taking aspirin 81 mg daily with no bleeding.   At last visit her A1c was 6.6 with symptoms of polyuria. She reports compliance with taking metformin without GI intolerance. She has chronic polyuria but denies symptomatic hypoglycemia, blurry vision, polydipsia, polyphagia, neuropathy, or foot ulcer/injury. She has been trying to follow a healthy diet and exercise regularly. She has lost 2 lb since last visit.       Past Medical History  Diagnosis Date  . Hypertension   . Non-ST elevation myocardial infarction (NSTEMI), initial episode of care 05/02/2011  . Hyperlipidemia   . Lumbar disc disease   . Morbid Obesity 05/02/2011  . CAD (coronary artery disease) 05/04/2011   Current Outpatient Prescriptions  Medication Sig Dispense Refill  . albuterol (PROVENTIL HFA;VENTOLIN HFA) 108 (90 BASE) MCG/ACT inhaler Inhale 2 puffs into the lungs every 6 (six) hours as needed for wheezing or shortness of breath.  1 Inhaler  3  . amLODipine (NORVASC) 10 MG tablet Take  1 tablet (10 mg total) by mouth daily.  90 tablet  3  . aspirin EC 81 MG tablet Take 1 tablet (81 mg total) by mouth daily.  90 tablet  3  . atenolol (TENORMIN) 50 MG tablet Take 1 tablet (50 mg total) by mouth daily.  90 tablet  3  . lisinopril-hydrochlorothiazide (PRINZIDE,ZESTORETIC) 10-12.5 MG per tablet Take 1 tablet by mouth daily.  30 tablet  2  . metFORMIN (GLUCOPHAGE) 500 MG tablet Take 1 tablet (500 mg total) by mouth 2 (two) times daily with a meal.  60 tablet  3  . nitroGLYCERIN (NITROSTAT) 0.4 MG SL tablet Place 1 tablet (0.4 mg total) under the tongue every 5 (five) minutes as needed for chest pain.  25 tablet  12  . pravastatin (PRAVACHOL) 40 MG tablet Take 1 tablet (40 mg total) by mouth daily.  90 tablet  3   No current facility-administered medications for this visit.   Family History  Problem Relation Age of Onset  . Stroke Mother   . Cancer Father    History   Social History  . Marital Status: Married    Spouse Name: N/A    Number of Children: N/A  . Years of Education: N/A   Social History Main Topics  . Smoking status: Former Smoker -- 0.50 packs/day for 50 years    Types: Cigarettes    Quit date: 04/01/2011  . Smokeless tobacco: Never Used  . Alcohol Use: No  . Drug Use: No  . Sexual Activity: No   Other Topics Concern  .  None   Social History Narrative  . None   Review of Systems: Review of Systems  Constitutional: Positive for weight loss. Negative for fever and chills.  Eyes: Negative for blurred vision.  Respiratory: Positive for shortness of breath (chronic with exertion). Negative for cough and wheezing.   Cardiovascular: Negative for chest pain, palpitations and leg swelling.  Gastrointestinal: Negative for nausea, vomiting, abdominal pain, diarrhea and constipation.  Genitourinary: Negative for dysuria, urgency and frequency.       Chronic polyuria  Musculoskeletal: Positive for back pain (chronic low back). Negative for myalgias.    Neurological: Negative for dizziness, sensory change and headaches.  Endo/Heme/Allergies: Negative for polydipsia.    Objective:  Physical Exam: Filed Vitals:   04/04/14 1428  BP: 177/59  Pulse: 56  Temp: 98 F (36.7 C)  TempSrc: Oral  Weight: 322 lb 12.8 oz (146.421 kg)  SpO2: 100%   Physical Exam  Constitutional: She is oriented to person, place, and time. She appears well-developed and well-nourished. No distress.  Morbidly obese  HENT:  Head: Normocephalic and atraumatic.  Eyes: EOM are normal.  Neck: Normal range of motion. Neck supple.  Cardiovascular: Normal rate and regular rhythm.   Distant heart sounds  Pulmonary/Chest: Effort normal. No respiratory distress. She has no wheezes. She has no rales.  Distant breath sounds  Abdominal: Soft. Bowel sounds are normal. She exhibits no distension. There is no tenderness. There is no rebound and no guarding.  Musculoskeletal: Normal range of motion. She exhibits edema (trace b/l  LE edema ). She exhibits no tenderness.  Neurological: She is alert and oriented to person, place, and time.  Skin: Skin is warm and dry. No rash noted. She is not diaphoretic. No erythema. No pallor.  Psychiatric: She has a normal mood and affect. Her behavior is normal. Judgment and thought content normal.    Assessment & Plan:   Please see problem list for problem-based assessment and plan

## 2014-04-05 DIAGNOSIS — E1122 Type 2 diabetes mellitus with diabetic chronic kidney disease: Secondary | ICD-10-CM | POA: Insufficient documentation

## 2014-04-05 DIAGNOSIS — N182 Chronic kidney disease, stage 2 (mild): Secondary | ICD-10-CM

## 2014-04-05 LAB — COMPLETE METABOLIC PANEL WITH GFR
ALBUMIN: 3.6 g/dL (ref 3.5–5.2)
ALT: 11 U/L (ref 0–35)
AST: 16 U/L (ref 0–37)
Alkaline Phosphatase: 84 U/L (ref 39–117)
BUN: 23 mg/dL (ref 6–23)
CALCIUM: 9.1 mg/dL (ref 8.4–10.5)
CHLORIDE: 105 meq/L (ref 96–112)
CO2: 27 mEq/L (ref 19–32)
CREATININE: 1.09 mg/dL (ref 0.50–1.10)
GFR, EST AFRICAN AMERICAN: 59 mL/min — AB
GFR, EST NON AFRICAN AMERICAN: 51 mL/min — AB
GLUCOSE: 105 mg/dL — AB (ref 70–99)
POTASSIUM: 4 meq/L (ref 3.5–5.3)
Sodium: 139 mEq/L (ref 135–145)
Total Bilirubin: 0.5 mg/dL (ref 0.2–1.2)
Total Protein: 7.8 g/dL (ref 6.0–8.3)

## 2014-04-05 NOTE — Assessment & Plan Note (Addendum)
Assessment: Pt with poorly controlled hypertension compliant with four-class (CCB, BB, ACEi, and diuretic) anti-hypertensive therapy who presents with improved blood pressure of 169/49.   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

## 2014-04-05 NOTE — Assessment & Plan Note (Addendum)
Assessment: Pt with newly diagnosed Type II DM with A1c of 6.6 on 03/29/14 compliant with oral hypoglycemic therapy with no symptomatic hypoglycemia who presents with blood glucose of 105.   Plan:  -A1c 6.6 at goal <7, continue newly started metformin 500 mg BID  -BP 169/44 not at goal <140/90, increase lisinopril-HCTZ from 10-12.5 mg daily to 20-25 mg daily, continue atenolol 50 mg daily and amlodipine 10 mg daily  -LDL 128 not at goal <100, continue pravastatin 40 mg daily -Obtain annual eye and foot exam at next visit -Obtain annual urine microalbumin at next visit   -BMI 52.88 not at goal <30, encourage weight loss -Continue aspirin 81 mg daily for primary CVD prevention

## 2014-04-05 NOTE — Assessment & Plan Note (Signed)
Assessment: Pt is a past cigarette smoker with history of obstructive CAD and uncontrolled hypertension who presents with symptoms of dyspnea with exertion with no symptoms of angina.   Plan:  -Pt scheduled for pulmonary function testing on 03/2914 (in setting of past tobacco use and wheezing)  -Continue albuterol inhaler 2 puffs Q 6 hr PRN acute bronchospasm  -Consider repeating 2D-echo (last one in 2012 with severe concentric hypertrophy without mention of diastolic dysfunction)

## 2014-04-05 NOTE — Assessment & Plan Note (Signed)
Pt declined annual influenza vaccination, screening mammography and colonoscopy today on 04/04/14.

## 2014-04-08 NOTE — Progress Notes (Signed)
Internal Medicine Clinic Attending  Case discussed with Dr. Rabbani soon after the resident saw the patient.  We reviewed the resident's history and exam and pertinent patient test results.  I agree with the assessment, diagnosis, and plan of care documented in the resident's note.  

## 2014-04-10 ENCOUNTER — Ambulatory Visit (HOSPITAL_COMMUNITY)
Admission: RE | Admit: 2014-04-10 | Discharge: 2014-04-10 | Disposition: A | Discharge status: Home / Self Care | Payer: Medicare HMO | Source: Ambulatory Visit | Attending: Internal Medicine | Admitting: Internal Medicine

## 2014-04-10 DIAGNOSIS — R0600 Dyspnea, unspecified: Secondary | ICD-10-CM | POA: Insufficient documentation

## 2014-04-10 LAB — PULMONARY FUNCTION TEST
DL/VA % pred: 83 %
DL/VA: 3.9 ml/min/mmHg/L
DLCO COR % PRED: 56 %
DLCO COR: 12.9 ml/min/mmHg
DLCO UNC % PRED: 56 %
DLCO unc: 12.9 ml/min/mmHg
FEF 25-75 PRE: 0.92 L/s
FEF 25-75 Post: 1.18 L/sec
FEF2575-%CHANGE-POST: 28 %
FEF2575-%PRED-POST: 75 %
FEF2575-%PRED-PRE: 58 %
FEV1-%Change-Post: 5 %
FEV1-%Pred-Post: 87 %
FEV1-%Pred-Pre: 83 %
FEV1-POST: 1.5 L
FEV1-Pre: 1.42 L
FEV1FVC-%Change-Post: 7 %
FEV1FVC-%PRED-PRE: 93 %
FEV6-%CHANGE-POST: 1 %
FEV6-%PRED-POST: 91 %
FEV6-%Pred-Pre: 90 %
FEV6-POST: 1.95 L
FEV6-Pre: 1.92 L
FEV6FVC-%CHANGE-POST: 2 %
FEV6FVC-%Pred-Post: 104 %
FEV6FVC-%Pred-Pre: 101 %
FVC-%Change-Post: -1 %
FVC-%PRED-PRE: 89 %
FVC-%Pred-Post: 88 %
FVC-PRE: 1.98 L
FVC-Post: 1.95 L
POST FEV1/FVC RATIO: 77 %
POST FEV6/FVC RATIO: 100 %
Pre FEV1/FVC ratio: 72 %
Pre FEV6/FVC Ratio: 97 %
RV % pred: 107 %
RV: 2.32 L
TLC % PRED: 92 %
TLC: 4.54 L

## 2014-04-10 MED ORDER — ALBUTEROL SULFATE (2.5 MG/3ML) 0.083% IN NEBU
2.5000 mg | INHALATION_SOLUTION | Freq: Once | RESPIRATORY_TRACT | Status: AC
  Administered 2014-04-10: 2.5 mg via RESPIRATORY_TRACT

## 2014-05-02 ENCOUNTER — Ambulatory Visit (INDEPENDENT_AMBULATORY_CARE_PROVIDER_SITE_OTHER): Payer: Medicare HMO | Admitting: Internal Medicine

## 2014-05-02 ENCOUNTER — Encounter: Payer: Self-pay | Admitting: Internal Medicine

## 2014-05-02 VITALS — BP 168/50 | HR 58 | Temp 98.2°F | Wt 318.9 lb

## 2014-05-02 DIAGNOSIS — I10 Essential (primary) hypertension: Secondary | ICD-10-CM

## 2014-05-02 DIAGNOSIS — D649 Anemia, unspecified: Secondary | ICD-10-CM

## 2014-05-02 DIAGNOSIS — N182 Chronic kidney disease, stage 2 (mild): Secondary | ICD-10-CM

## 2014-05-02 DIAGNOSIS — R0600 Dyspnea, unspecified: Secondary | ICD-10-CM

## 2014-05-02 DIAGNOSIS — E1122 Type 2 diabetes mellitus with diabetic chronic kidney disease: Secondary | ICD-10-CM

## 2014-05-02 DIAGNOSIS — Z0000 Encounter for general adult medical examination without abnormal findings: Secondary | ICD-10-CM

## 2014-05-02 DIAGNOSIS — I129 Hypertensive chronic kidney disease with stage 1 through stage 4 chronic kidney disease, or unspecified chronic kidney disease: Secondary | ICD-10-CM

## 2014-05-02 LAB — CBC
HCT: 35.5 % — ABNORMAL LOW (ref 36.0–46.0)
Hemoglobin: 11.5 g/dL — ABNORMAL LOW (ref 12.0–15.0)
MCH: 27.8 pg (ref 26.0–34.0)
MCHC: 32.4 g/dL (ref 30.0–36.0)
MCV: 85.7 fL (ref 78.0–100.0)
MPV: 9.6 fL (ref 9.4–12.4)
PLATELETS: 348 10*3/uL (ref 150–400)
RBC: 4.14 MIL/uL (ref 3.87–5.11)
RDW: 15.6 % — AB (ref 11.5–15.5)
WBC: 9.3 10*3/uL (ref 4.0–10.5)

## 2014-05-02 MED ORDER — SPIRONOLACTONE 25 MG PO TABS: 12.5000 mg | ORAL_TABLET | Freq: Every day | ORAL | Status: DC

## 2014-05-02 MED ORDER — LISINOPRIL-HYDROCHLOROTHIAZIDE 10-12.5 MG PO TABS: 2.0000 | ORAL_TABLET | Freq: Every day | ORAL | Status: DC

## 2014-05-02 NOTE — Patient Instructions (Signed)
-  Start taking spirolactone 12.5 mg daily for your blood pressure  -Will check your bloodwork today  -Keep taking your other medications -Will see you back in 1 month, thank you for bringing your medications today!!      General Instructions:   Thank you for bringing your medicines today. This helps us keep you safe from mistakes.   Progress Toward Treatment Goals:  No flowsheet data found.  Self Care Goals & Plans:  No flowsheet data found.  No flowsheet data found.   Care Management & Community Referrals:  No flowsheet data found.

## 2014-05-02 NOTE — Progress Notes (Signed)
Patient ID: Jamie Mason, female   DOB: 12/04/1940, 73 y.o.   MRN: [id number]    Subjective:   Patient ID: Jamie Mason female   DOB: 12/22/1940 73 y.o.   MRN: [id number]  HPI: Jamie Mason is a 73 y.o.  pleasant woman with past medical history of uncontrolled hypertension, hyperlipidemia, obstructive CAD, NSTEMI in 2012, lumbar disc disease, and newly diagnosed Type II DM who presents for follow-up of uncontrolled hypertension.   She reports compliance with taking atenolol, increased dosage of lisinopril-HCTZ, and amlodipine daily since last visit. She denies headache, blurry vision, palpitations, chest pain, lightheadedness, or LE edema. She does not have a blood pressure monitor cuff at home.   Her last  A1c was 6.6 on 03/19/14. She reports compliance with taking metformin with possible GI upset but unsure which medication is causing it. She denies symptomatic hypoglycemia, blurry vision, polydipsia, polyphagia, polyphagia, neuropathy, or foot ulcer/injury. She has been trying to follow a healthy diet but unfortunately unable to exercise regularly. She has lost 4 lb since last visit one month ago.   She continues to have chronic dyspnea with exertion. Her pulmonary function testing on 04/05/14 revealed a diffusion defect but no overinflation to suggest emphysema. She did not have obstructive disease as her FEV1/FVC was over 70. Her last 2D-echo was normal in 2012 and last chest xray on 03/19/14 revealed stable cardiomegaly. She quit smoking several years ago. She has not had to use albuterol recently and denies cough, wheezing, or chest tightness. She has lost a few pounds but remains morbidly obese with BMI of 52. She denies history of anemia and last CBC in 2012 was normal. She reports having craving for ice.   She declines health maintenance testing at this time including flu shot, eye exam, foot exam, urine microalburmin, and referrals for colonoscopy and mammography.     Past  Medical History  Diagnosis Date  . Hypertension   . Non-ST elevation myocardial infarction (NSTEMI), initial episode of care 05/02/2011  . Hyperlipidemia   . Lumbar disc disease   . Morbid Obesity 05/02/2011  . CAD (coronary artery disease) 05/04/2011   Current Outpatient Prescriptions  Medication Sig Dispense Refill  . albuterol (PROVENTIL HFA;VENTOLIN HFA) 108 (90 BASE) MCG/ACT inhaler Inhale 2 puffs into the lungs every 6 (six) hours as needed for wheezing or shortness of breath. 1 Inhaler 3  . amLODipine (NORVASC) 10 MG tablet Take 1 tablet (10 mg total) by mouth daily. 90 tablet 3  . aspirin EC 81 MG tablet Take 1 tablet (81 mg total) by mouth daily. 90 tablet 3  . atenolol (TENORMIN) 50 MG tablet Take 1 tablet (50 mg total) by mouth daily. 90 tablet 3  . lisinopril-hydrochlorothiazide (PRINZIDE,ZESTORETIC) 10-12.5 MG per tablet Take 2 tablets by mouth daily. 30 tablet 2  . metFORMIN (GLUCOPHAGE) 500 MG tablet Take 1 tablet (500 mg total) by mouth 2 (two) times daily with a meal. 60 tablet 3  . nitroGLYCERIN (NITROSTAT) 0.4 MG SL tablet Place 1 tablet (0.4 mg total) under the tongue every 5 (five) minutes as needed for chest pain. 25 tablet 12  . pravastatin (PRAVACHOL) 40 MG tablet Take 1 tablet (40 mg total) by mouth daily. 90 tablet 3   No current facility-administered medications for this visit.   Family History  Problem Relation Age of Onset  . Stroke Mother   . Cancer Father    History   Social History  . Marital Status: Married  Spouse Name: N/A    Number of Children: N/A  . Years of Education: N/A   Social History Main Topics  . Smoking status: Former Smoker -- 0.50 packs/day for 50 years    Types: Cigarettes    Quit date: 04/01/2011  . Smokeless tobacco: Never Used  . Alcohol Use: No  . Drug Use: No  . Sexual Activity: No   Other Topics Concern  . None   Social History Narrative   Review of Systems: Review of Systems  Constitutional: Positive for  weight loss (4 lbs since last visit ). Negative for fever and chills.  Eyes: Negative for blurred vision.  Respiratory: Positive for shortness of breath (with exertion). Negative for cough and wheezing.   Cardiovascular: Negative for chest pain, palpitations and leg swelling.  Gastrointestinal: Negative for nausea, vomiting, abdominal pain, diarrhea and constipation.  Genitourinary: Negative for dysuria, urgency, frequency and hematuria.  Musculoskeletal: Positive for back pain (chronic low back). Negative for myalgias and falls.  Neurological: Negative for dizziness, sensory change and headaches.  Endo/Heme/Allergies: Negative for polydipsia.    Objective:  Physical Exam: Filed Vitals:   05/02/14 1515  BP: 168/50  Pulse: 58  Temp: 98.2 F (36.8 C)  TempSrc: Oral  Weight: 318 lb 14.4 oz (144.652 kg)  SpO2: 100%    Physical Exam  Constitutional: She is oriented to person, place, and time. She appears well-developed and well-nourished. No distress.  Morbidly obese  HENT:  Head: Normocephalic and atraumatic.  Eyes: EOM are normal.  Neck: Normal range of motion. Neck supple.  Cardiovascular: Normal rate and regular rhythm.   Distant heart sounds  Pulmonary/Chest: Effort normal. No respiratory distress. She has no wheezes. She has no rales.  Decreased breath sounds  Abdominal: Soft. Bowel sounds are normal. She exhibits no distension. There is no tenderness. There is no rebound and no guarding.  Musculoskeletal: Normal range of motion. She exhibits edema (trace b/l  LE nonpitting edema). She exhibits no tenderness.  Neurological: She is alert and oriented to person, place, and time.  Skin: Skin is warm and dry. No rash noted. She is not diaphoretic. No erythema. No pallor.  Psychiatric: She has a normal mood and affect. Her behavior is normal. Judgment and thought content normal.    Assessment & Plan:   Please see problem list for problem-based assessment and plan

## 2014-05-03 DIAGNOSIS — D649 Anemia, unspecified: Secondary | ICD-10-CM | POA: Insufficient documentation

## 2014-05-03 LAB — BASIC METABOLIC PANEL WITH GFR
BUN: 15 mg/dL (ref 6–23)
CALCIUM: 9.1 mg/dL (ref 8.4–10.5)
CO2: 29 mEq/L (ref 19–32)
CREATININE: 1.11 mg/dL — AB (ref 0.50–1.10)
Chloride: 103 mEq/L (ref 96–112)
GFR, EST AFRICAN AMERICAN: 58 mL/min — AB
GFR, Est Non African American: 50 mL/min — ABNORMAL LOW
Glucose, Bld: 102 mg/dL — ABNORMAL HIGH (ref 70–99)
Potassium: 4.6 mEq/L (ref 3.5–5.3)
Sodium: 140 mEq/L (ref 135–145)

## 2014-05-03 NOTE — Assessment & Plan Note (Signed)
Assessment: Pt is a past cigarette smoker with normal PFT on 04/10/14 and history of obstructive CAD with morbid obesity who presents with symptoms of dyspnea with exertion with no symptoms of angina.   Plan:  -BMI 52.24 not at goal <25, encourage weight loss -Obtain CBC to assess for anemia ---> Hg 11.5 HCT 35.5 MCV 85.7 -Continue albuterol inhaler 2 puffs Q 6 hr PRN acute bronchospasm  -Consider repeating 2D-echo (last one in 2012 with severe concentric hypertrophy without mention of diastolic dysfunction)

## 2014-05-03 NOTE — Assessment & Plan Note (Signed)
Pt declined annual influenza vaccination, screening mammography, and colonoscopy today on 05/03/14. Will inquire again at next visit.

## 2014-05-03 NOTE — Assessment & Plan Note (Addendum)
Assessment: Pt is a postmenopausal woman with normal CBC in 2012 now with mild normocytic anemia with no prior colonoscopy who presents with chronic dyspnea with exertion and pica with no active bleeding or hemodynamic instability.   Plan:  -Obtain CBC ---> MCV 85.7 Hg 11.5 HCT 35.5 -Will obtain anemia panel at next visit  -Pt declined referral for screening colonoscopy at this time, will inquire again at next visit

## 2014-05-03 NOTE — Assessment & Plan Note (Addendum)
Assessment: Pt with poorly controlled hypertension compliant with four-class (CCB, BB, ACEi, and diuretic) anti-hypertensive therapy who presents with unchanged blood pressure of 168/50.   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

## 2014-05-03 NOTE — Assessment & Plan Note (Signed)
Assessment: Pt with newly diagnosed Type II DM with A1c of 6.6 on 03/29/14 compliant with oral hypoglycemic therapy with no symptomatic hypoglycemia who presents with blood glucose of 102.   Plan:  -A1c 6.6 at goal <7, continue metformin 500 mg BID, monitor for GI upset -BP 168/50 not at goal <140/90, start spironolactone 12.5 mg daily and continue lisinopril-HCTZ 20-25 mg daily, atenolol 50 mg daily, and amlodipine 10 mg daily  -LDL 128 not at goal <100, continue pravastatin 40 mg daily -Pt declined annual eye exam, foot exam, and annual urine microalbumin, inquire at next visit   -BMI 52.24 not at goal <30, encourage weight loss -Continue aspirin 81 mg daily for primary CVD prevention

## 2014-05-05 NOTE — Progress Notes (Signed)
Medicine attending: Medical history, presenting complaints, physical findings, and medications, reviewed with resident physician Dr. Marjan Rabbani and I concur with her evaluation and management plan. 

## 2014-05-21 ENCOUNTER — Encounter (HOSPITAL_COMMUNITY): Payer: Self-pay | Admitting: Cardiology

## 2014-05-30 ENCOUNTER — Encounter: Payer: Self-pay | Admitting: Internal Medicine

## 2014-05-30 ENCOUNTER — Ambulatory Visit (INDEPENDENT_AMBULATORY_CARE_PROVIDER_SITE_OTHER): Payer: Medicare HMO | Admitting: Internal Medicine

## 2014-05-30 VITALS — BP 175/63 | HR 68 | Temp 99.3°F | Wt 318.2 lb

## 2014-05-30 DIAGNOSIS — E119 Type 2 diabetes mellitus without complications: Secondary | ICD-10-CM

## 2014-05-30 DIAGNOSIS — Z87891 Personal history of nicotine dependence: Secondary | ICD-10-CM

## 2014-05-30 DIAGNOSIS — D649 Anemia, unspecified: Secondary | ICD-10-CM

## 2014-05-30 DIAGNOSIS — I129 Hypertensive chronic kidney disease with stage 1 through stage 4 chronic kidney disease, or unspecified chronic kidney disease: Secondary | ICD-10-CM

## 2014-05-30 DIAGNOSIS — N182 Chronic kidney disease, stage 2 (mild): Secondary | ICD-10-CM

## 2014-05-30 DIAGNOSIS — Z0000 Encounter for general adult medical examination without abnormal findings: Secondary | ICD-10-CM

## 2014-05-30 DIAGNOSIS — I252 Old myocardial infarction: Secondary | ICD-10-CM

## 2014-05-30 DIAGNOSIS — I10 Essential (primary) hypertension: Secondary | ICD-10-CM

## 2014-05-30 DIAGNOSIS — Z7982 Long term (current) use of aspirin: Secondary | ICD-10-CM

## 2014-05-30 DIAGNOSIS — E1122 Type 2 diabetes mellitus with diabetic chronic kidney disease: Secondary | ICD-10-CM

## 2014-05-30 LAB — ANEMIA PANEL
%SAT: 24 % (ref 20–55)
ABS RETIC: 72.4 10*3/uL (ref 19.0–186.0)
FOLATE: 9.3 ng/mL
Ferritin: 171 ng/mL (ref 10–291)
Iron: 71 ug/dL (ref 42–145)
RBC.: 4.26 MIL/uL (ref 3.87–5.11)
RETIC CT PCT: 1.7 % (ref 0.4–2.3)
TIBC: 292 ug/dL (ref 250–470)
UIBC: 221 ug/dL (ref 125–400)
Vitamin B-12: 628 pg/mL (ref 211–911)

## 2014-05-30 LAB — HM DIABETES EYE EXAM

## 2014-05-30 MED ORDER — SPIRONOLACTONE 25 MG PO TABS: 25.0000 mg | ORAL_TABLET | Freq: Every day | ORAL | Status: DC

## 2014-05-30 MED ORDER — ZOSTER VACCINE LIVE 19400 UNT/0.65ML ~~LOC~~ SOLR: 0.6500 mL | Freq: Once | SUBCUTANEOUS | Status: DC

## 2014-05-30 NOTE — Assessment & Plan Note (Addendum)
Assessment: Pt is a postmenopausal woman with CKD Stage 2/3 and mild normocytic anemia with no prior colonoscopy who presents with chronic dyspnea with exertion and pica with no active bleeding or hemodynamic instability.   Plan:  -Last CBC on 05/02/14 -Obtain anemia panel and consider work-up for multiple myeloma  -Referral to GI for screening colonoscopy at next visit

## 2014-05-30 NOTE — Assessment & Plan Note (Signed)
Assessment: Pt with newly diagnosed Type II DM with A1c of 6.6 on 03/29/14 compliant with oral hypoglycemic therapy with no symptomatic hypoglycemia.   Plan:  -A1c 6.6 at goal <7, continue metformin 500 mg BID -BP 175/63 at goal <140/90, pt instructed to start spironolactone 25 mg daily and continue lisinopril-HCTZ 20-25 mg daily, atenolol 50 mg daily, and amlodipine 10 mg daily  -LDL 128 not at goal <100, continue pravastatin 40 mg daily -Pt received eye exam (retinal scanner) today -Perform foot exam and refer to podiatry for trimming of very long toenails  -Obtain annual urine microalbumin test   -BMI 52.13 not at goal <30, encourage weight loss -Continue aspirin 81 mg daily for secondary CVD prevention

## 2014-05-30 NOTE — Assessment & Plan Note (Addendum)
Assessment: Pt with poorly controlled hypertension compliant with four-class (CCB, BB, ACEi, and diuretic) anti-hypertensive therapy and non-compliant with newly started potassium-sparing diuretic therapy who presents with asymptomatic blood pressure of 175/63.   Plan:  -BP 175/63 not at goal <140/90  -Obtain aldosterone:renin level to assess for hyperaldosteronism  -Pt instructed to start spironolactone 25 mg daily for drug-resistant hypertension  -Continue lisinopril-HCTZ 20-25 mg daily, atenolol 50 mg daily, and amlodipine 10 mg daily -Last BMP on 05/02/14 -Pt instructed to return in 1 month and record home blood pressure readings

## 2014-05-30 NOTE — Patient Instructions (Addendum)
-  Start taking spironolactone 25 mg daily for high blood pressure in addition to your other medications -Start checking your blood pressure at home and record the values on the log -Will refer you to GI for colonoscopy at your next visit -Will check your bloodwork today to see if you need to be on oral iron supplements  -Will give you flu and pneumonia shots today and give you a prescription for shingles vaccine  -Take tylenol instead of advil and ibuprofen for pain -Will check your eyes today  -Will refer you to podiatry for clipping of your toenails  -Will see you back in 1 month, happy holidays!  General Instructions:   Please try to bring all your medicines next time. This will help us keep you safe from mistakes.   Progress Toward Treatment Goals:  No flowsheet data found.  Self Care Goals & Plans:  Self Care Goal 05/02/2014  Manage my medications take my medicines as prescribed; bring my medications to every visit; refill my medications on time  Monitor my health bring my glucose meter and log to each visit; keep track of my blood glucose  Eat healthy foods eat foods that are low in salt; eat baked foods instead of fried foods    No flowsheet data found.   Care Management & Community Referrals:  No flowsheet data found.

## 2014-05-30 NOTE — Progress Notes (Signed)
Patient ID: Jamie Mason, female   DOB: 09/22/1940, 73 y.o.   MRN: [id number]    Subjective:   Patient ID: Jamie Mason female   DOB: 06/30/1940 73 y.o.   MRN: [id number]  HPI: Jamie Mason is a 73 y.o. pleasant woman with past medical history of uncontrolled hypertension,  non-insulin dependent Type II DM, hyperlipidemia, CAD, NSTEMI in 2012, and lumbar disc disease who presents for follow-up of uncontrolled hypertension.   She reports not picking up the prescription for spironolactone that was prescribed at last visit but is compliant with taking atenolol, lisinopril-HCTZ, and amlodipine daily since last visit. She denies headache, blurry vision, chest pain, lightheadedness, or LE edema. She did obtain a blood pressure monitor at home but has not used it yet.   Her last A1c was 6.6 on 03/19/14. She reports compliance with taking metformin 500 mg BID with no GI side effects. She denies symptomatic hypoglycemia, blurry vision, polydipsia, polyphagia, polyphagia, neuropathy, or foot ulcer/injury. She has very long toenails that she has been unable to cut herself. She has been trying to follow a healthy diet but unfortunately unable to exercise regularly. Her weight has been stable since last visit.   She reports a history of anemia in the past and CBC at last visit revealed normocytic anemia with Hg of 11.5. She has chronic fatigue and craving for ice. She has not previously been on iron supplements. She would like to have a colonoscopy which she has not had previously. She denies GI or GU bleeding.    She would like to have a flu shot and pneumonia vaccine today. She declines mammogram.      Past Medical History  Diagnosis Date  . Hypertension   . Non-ST elevation myocardial infarction (NSTEMI), initial episode of care 05/02/2011  . Hyperlipidemia   . Lumbar disc disease   . Morbid Obesity 05/02/2011  . CAD (coronary artery disease) 05/04/2011   Current Outpatient Prescriptions    Medication Sig Dispense Refill  . albuterol (PROVENTIL HFA;VENTOLIN HFA) 108 (90 BASE) MCG/ACT inhaler Inhale 2 puffs into the lungs every 6 (six) hours as needed for wheezing or shortness of breath. 1 Inhaler 3  . amLODipine (NORVASC) 10 MG tablet Take 1 tablet (10 mg total) by mouth daily. 90 tablet 3  . aspirin EC 81 MG tablet Take 1 tablet (81 mg total) by mouth daily. 90 tablet 3  . atenolol (TENORMIN) 50 MG tablet Take 1 tablet (50 mg total) by mouth daily. 90 tablet 3  . lisinopril-hydrochlorothiazide (PRINZIDE,ZESTORETIC) 10-12.5 MG per tablet Take 2 tablets by mouth daily. 60 tablet 3  . metFORMIN (GLUCOPHAGE) 500 MG tablet Take 1 tablet (500 mg total) by mouth 2 (two) times daily with a meal. 60 tablet 3  . nitroGLYCERIN (NITROSTAT) 0.4 MG SL tablet Place 1 tablet (0.4 mg total) under the tongue every 5 (five) minutes as needed for chest pain. 25 tablet 12  . pravastatin (PRAVACHOL) 40 MG tablet Take 1 tablet (40 mg total) by mouth daily. 90 tablet 3  . spironolactone (ALDACTONE) 25 MG tablet Take 1 tablet (25 mg total) by mouth daily. 30 tablet 3  . zoster vaccine live, PF, (ZOSTAVAX) [id number] UNT/0.65ML injection Inject 19,400 Units into the skin once. 1 each 0   No current facility-administered medications for this visit.   Family History  Problem Relation Age of Onset  . Stroke Mother   . Cancer Father    History   Social History  . Marital  Status: Married    Spouse Name: N/A    Number of Children: N/A  . Years of Education: 12   Occupational History  .  Unemployed   Social History Main Topics  . Smoking status: Former Smoker -- 0.50 packs/day for 50 years    Types: Cigarettes    Quit date: 04/01/2011  . Smokeless tobacco: Never Used  . Alcohol Use: No  . Drug Use: No  . Sexual Activity: No   Other Topics Concern  . None   Social History Narrative   Review of Systems: Review of Systems  Constitutional: Negative for fever and chills.  Eyes: Negative for  blurred vision.  Respiratory: Positive for shortness of breath (with exertion). Negative for cough and wheezing.   Cardiovascular: Negative for chest pain and leg swelling.  Gastrointestinal: Negative for nausea, vomiting, abdominal pain, diarrhea, constipation and blood in stool.  Genitourinary: Negative for dysuria, urgency, frequency and hematuria.  Musculoskeletal: Positive for back pain. Negative for myalgias.  Neurological: Negative for dizziness, sensory change and headaches.  Endo/Heme/Allergies: Negative for polydipsia.    Objective:  Physical Exam: Filed Vitals:   05/30/14 1412 05/30/14 1530  BP: 204/80 175/63  Pulse: 68   Temp: 99.3 F (37.4 C)   TempSrc: Oral   Weight: 318 lb 3.2 oz (144.335 kg)   SpO2: 99%    Physical Exam  Assessment & Plan:   Please see problem list for problem-based assessment and plan

## 2014-05-30 NOTE — Assessment & Plan Note (Addendum)
-  Pt received annual influenza vaccination today on 05/30/14.  -Pt received PPSV23 today on 05/30/14 and will need PCV13 vaccination in more than 1 year. -Pt given prescription for zostavax vaccination  -Pt to have GI referral for screening colonoscopy at next visit -Pt declined screening mammography at this time, will inquire again at next visit

## 2014-05-31 LAB — MICROALBUMIN / CREATININE URINE RATIO
CREATININE, URINE: 147.2 mg/dL
MICROALB/CREAT RATIO: 78.1 mg/g — AB (ref 0.0–30.0)
Microalb, Ur: 11.5 mg/dL — ABNORMAL HIGH (ref <2.0)

## 2014-06-02 ENCOUNTER — Encounter: Payer: Self-pay | Admitting: *Deleted

## 2014-06-02 NOTE — Progress Notes (Signed)
Medicine attending: Medical history, presenting problems, physical findings, and medications, reviewed with Dr Johna Rolesabbani on the day of the patient visit  and I concur with her and management plan.

## 2014-06-05 LAB — ALDOSTERONE + RENIN ACTIVITY W/ RATIO
ALDO / PRA RATIO: 1.7 ratio (ref 0.9–28.9)
ALDOSTERONE: 2 ng/dL
PRA LC/MS/MS: 1.19 ng/mL/h (ref 0.25–5.82)

## 2014-06-23 ENCOUNTER — Ambulatory Visit (INDEPENDENT_AMBULATORY_CARE_PROVIDER_SITE_OTHER): Payer: Medicare HMO | Admitting: Podiatry

## 2014-06-23 ENCOUNTER — Encounter: Payer: Self-pay | Admitting: Podiatry

## 2014-06-23 VITALS — BP 165/69 | HR 74 | Resp 14 | Ht 65.5 in | Wt 318.0 lb

## 2014-06-23 DIAGNOSIS — M79676 Pain in unspecified toe(s): Secondary | ICD-10-CM

## 2014-06-23 DIAGNOSIS — B351 Tinea unguium: Secondary | ICD-10-CM

## 2014-06-23 NOTE — Patient Instructions (Signed)
Diabetes and Foot Care Diabetes may cause you to have problems because of poor blood supply (circulation) to your feet and legs. This may cause the skin on your feet to become thinner, break easier, and heal more slowly. Your skin may become dry, and the skin may peel and crack. You may also have nerve damage in your legs and feet causing decreased feeling in them. You may not notice minor injuries to your feet that could lead to infections or more serious problems. Taking care of your feet is one of the most important things you can do for yourself.  HOME CARE INSTRUCTIONS  Wear shoes at all times, even in the house. Do not go barefoot. Bare feet are easily injured.  Check your feet daily for blisters, cuts, and redness. If you cannot see the bottom of your feet, use a mirror or ask someone for help.  Wash your feet with warm water (do not use hot water) and mild soap. Then pat your feet and the areas between your toes until they are completely dry. Do not soak your feet as this can dry your skin.  Apply a moisturizing lotion or petroleum jelly (that does not contain alcohol and is unscented) to the skin on your feet and to dry, brittle toenails. Do not apply lotion between your toes.  Trim your toenails straight across. Do not dig under them or around the cuticle. File the edges of your nails with an emery board or nail file.  Do not cut corns or calluses or try to remove them with medicine.  Wear clean socks or stockings every day. Make sure they are not too tight. Do not wear knee-high stockings since they may decrease blood flow to your legs.  Wear shoes that fit properly and have enough cushioning. To break in new shoes, wear them for just a few hours a day. This prevents you from injuring your feet. Always look in your shoes before you put them on to be sure there are no objects inside.  Do not cross your legs. This may decrease the blood flow to your feet.  If you find a minor scrape,  cut, or break in the skin on your feet, keep it and the skin around it clean and dry. These areas may be cleansed with mild soap and water. Do not cleanse the area with peroxide, alcohol, or iodine.  When you remove an adhesive bandage, be sure not to damage the skin around it.  If you have a wound, look at it several times a day to make sure it is healing.  Do not use heating pads or hot water bottles. They may burn your skin. If you have lost feeling in your feet or legs, you may not know it is happening until it is too late.  Make sure your health care provider performs a complete foot exam at least annually or more often if you have foot problems. Report any cuts, sores, or bruises to your health care provider immediately. SEEK MEDICAL CARE IF:   You have an injury that is not healing.  You have cuts or breaks in the skin.  You have an ingrown nail.  You notice redness on your legs or feet.  You feel burning or tingling in your legs or feet.  You have pain or cramps in your legs and feet.  Your legs or feet are numb.  Your feet always feel cold. SEEK IMMEDIATE MEDICAL CARE IF:   There is increasing redness,   swelling, or pain in or around a wound.  There is a red line that goes up your leg.  Pus is coming from a wound.  You develop a fever or as directed by your health care provider.  You notice a bad smell coming from an ulcer or wound. Document Released: 05/27/2000 Document Revised: 01/30/2013 Document Reviewed: 11/06/2012 Exit[employer] Patient Information 2015 [employer]. This information is not intended to replace advice given to you by your health care provider. Make sure you discuss any questions you have with your health care provider.  

## 2014-06-23 NOTE — Progress Notes (Signed)
   Subjective:    Patient ID: Jamie Mason, female    DOB: 06/02/1941, 74 y.o.   MRN: [id number]  HPI Comments: N debridement L 10 toenails D and O long-term C extremely elongated, thickened toenails A diabetes, back and weight problems and difficulty cutting T [facility] Otis [facility]jan [facility] [phone] referred  Diabetes   this patient is not had any podiatric care recently and has not been able to trim her toenails for multiple years. She denies any history of skin ulceration or claudication    Review of Systems  Musculoskeletal: Positive for back pain.  All other systems reviewed and are negative.      Objective:   Physical Exam  Orientated 3  Vascular: PT pulses 1/4 bilaterally DP pulses 1/4 bilaterally  Neurological: Ankle reflexes equal and reactive bilaterally Vibratory sensation diminished bilaterally Sensation to 10 g monofilament wire intact 5/5 bilaterally  Dermatological: Atrophic skin without hair growth noted The toenails are extremely deformed and elongated up to 2-3 inches beyond the distal toe with extreme deformity either curling into the adjacent toe or back on the toe itself  Musculoskeletal: Pes planus bilaterally HAV deformities bilaterally There is no restriction ankle, subtalar, midtarsal joints bilaterally     Assessment & Plan:   Assessment: Diminished pedal pulses bilaterally suggestive of possible peripheral arterial disease 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

## 2014-06-24 ENCOUNTER — Encounter: Payer: Self-pay | Admitting: Podiatry

## 2014-07-02 ENCOUNTER — Ambulatory Visit: Payer: Medicare HMO | Admitting: Internal Medicine

## 2014-07-09 ENCOUNTER — Ambulatory Visit (INDEPENDENT_AMBULATORY_CARE_PROVIDER_SITE_OTHER): Payer: Medicare HMO | Admitting: Internal Medicine

## 2014-07-09 ENCOUNTER — Encounter: Payer: Self-pay | Admitting: Internal Medicine

## 2014-07-09 VITALS — BP 146/53 | HR 71 | Temp 99.0°F | Ht 65.0 in | Wt 320.0 lb

## 2014-07-09 DIAGNOSIS — Z87891 Personal history of nicotine dependence: Secondary | ICD-10-CM

## 2014-07-09 DIAGNOSIS — E1122 Type 2 diabetes mellitus with diabetic chronic kidney disease: Secondary | ICD-10-CM | POA: Diagnosis not present

## 2014-07-09 DIAGNOSIS — N182 Chronic kidney disease, stage 2 (mild): Secondary | ICD-10-CM | POA: Diagnosis not present

## 2014-07-09 DIAGNOSIS — I129 Hypertensive chronic kidney disease with stage 1 through stage 4 chronic kidney disease, or unspecified chronic kidney disease: Secondary | ICD-10-CM

## 2014-07-09 DIAGNOSIS — I10 Essential (primary) hypertension: Secondary | ICD-10-CM

## 2014-07-09 DIAGNOSIS — Z0000 Encounter for general adult medical examination without abnormal findings: Secondary | ICD-10-CM

## 2014-07-09 LAB — BASIC METABOLIC PANEL WITH GFR
BUN: 15 mg/dL (ref 6–23)
CHLORIDE: 103 meq/L (ref 96–112)
CO2: 29 mEq/L (ref 19–32)
Calcium: 8.8 mg/dL (ref 8.4–10.5)
Creat: 0.96 mg/dL (ref 0.50–1.10)
GFR, EST AFRICAN AMERICAN: 69 mL/min
GFR, Est Non African American: 60 mL/min
Glucose, Bld: 138 mg/dL — ABNORMAL HIGH (ref 70–99)
POTASSIUM: 3.6 meq/L (ref 3.5–5.3)
SODIUM: 139 meq/L (ref 135–145)

## 2014-07-09 LAB — GLUCOSE, CAPILLARY: GLUCOSE-CAPILLARY: 140 mg/dL — AB (ref 70–99)

## 2014-07-09 LAB — POCT GLYCOSYLATED HEMOGLOBIN (HGB A1C): HEMOGLOBIN A1C: 6.4

## 2014-07-09 NOTE — Patient Instructions (Signed)
-  Great job on your blood pressure and diabetes!! -Continue all of your current medications -Think about colonoscopy, mammogram, and bone mineral density scan for next time  -Please get the shingles vaccine at the pharmacy -Will try to get the records of your last tetanus shot  -Please come back in 3 months for diabetes check-up      General Instructions:   Please bring your medicines with you each time you come to clinic.  Medicines may include prescription medications, over-the-counter medications, herbal remedies, eye drops, vitamins, or other pills.   Progress Toward Treatment Goals:  No flowsheet data found.  Self Care Goals & Plans:  Self Care Goal 05/02/2014  Manage my medications take my medicines as prescribed; bring my medications to every visit; refill my medications on time  Monitor my health bring my glucose meter and log to each visit; keep track of my blood glucose  Eat healthy foods eat foods that are low in salt; eat baked foods instead of fried foods    No flowsheet data found.   Care Management & Community Referrals:  No flowsheet data found.

## 2014-07-09 NOTE — Progress Notes (Signed)
Internal Medicine Clinic Attending  I saw and evaluated the patient.  I personally confirmed the key portions of the history and exam documented by Dr. Rabbani and I reviewed pertinent patient test results.  The assessment, diagnosis, and plan were formulated together and I agree with the documentation in the resident's note. 

## 2014-07-09 NOTE — Progress Notes (Signed)
Patient ID: Jamie Mason Desantis, female   DOB: 08/27/1940, 89 y.o.   MRN: [id number]     Subjective:   Patient ID: Jamie Mason Raden female   DOB: 07/21/1940 74 y.o.   MRN: [id number]  HPI: Ms.Rosezella Mason Fredric MareBailey is a 74 y.o. pleasant woman with past medical history of uncontrolled hypertension,non-insulin dependent Type II DM, hyperlipidemia, CAD, NSTEMI in 2012, and lumbar disc disease who presents for follow-up of uncontrolled hypertension.   She is complaint with taking spironolactone that was prescribed at last visit in addition to atenolol, lisinopril-HCTZ, and amlodipine daily. She denies headache, blurry vision, chest pain, lightheadedness, or LE edema. Her aldosterone levels were normal at last visit.   Her last A1c was 6.6 on 03/19/14. She reports compliance with taking metformin 500 mg BID with no GI side effects. She denies symptomatic hypoglycemia, blurry vision, polydipsia, polyphagia, polyphagia, neuropathy, or foot ulcer/injury. She saw podiatry since last visit with cutting of her very long toenails. She has been trying to follow a healthy diet but unfortunately unable to exercise regularly. Her weight has been stable since last visit.   She would like to think about DEXA scan, screening colonoscopy, and mammogram until next visit. She reports having a tetanus vaccination in the past that was required for her job but unsure if it was in the past 10 years. She has not yet had the shingles vaccine at her pharmacy that was prescribed at last visit.     Past Medical History  Diagnosis Date  . Hypertension   . Non-ST elevation myocardial infarction (NSTEMI), initial episode of care 05/02/2011  . Hyperlipidemia   . Lumbar disc disease   . Morbid Obesity 05/02/2011  . CAD (coronary artery disease) 05/04/2011  . Diabetes mellitus without complication    Current Outpatient Prescriptions  Medication Sig Dispense Refill  . albuterol (PROVENTIL HFA;VENTOLIN HFA) 108 (90 BASE) MCG/ACT inhaler  Inhale 2 puffs into the lungs every 6 (six) hours as needed for wheezing or shortness of breath. 1 Inhaler 3  . amLODipine (NORVASC) 10 MG tablet Take 1 tablet (10 mg total) by mouth daily. 90 tablet 3  . aspirin EC 81 MG tablet Take 1 tablet (81 mg total) by mouth daily. 90 tablet 3  . atenolol (TENORMIN) 50 MG tablet Take 1 tablet (50 mg total) by mouth daily. 90 tablet 3  . lisinopril-hydrochlorothiazide (PRINZIDE,ZESTORETIC) 10-12.5 MG per tablet Take 2 tablets by mouth daily. 60 tablet 3  . metFORMIN (GLUCOPHAGE) 500 MG tablet Take 1 tablet (500 mg total) by mouth 2 (two) times daily with a meal. 60 tablet 3  . nitroGLYCERIN (NITROSTAT) 0.4 MG SL tablet Place 1 tablet (0.4 mg total) under the tongue every 5 (five) minutes as needed for chest pain. 25 tablet 12  . pravastatin (PRAVACHOL) 40 MG tablet Take 1 tablet (40 mg total) by mouth daily. 90 tablet 3  . spironolactone (ALDACTONE) 25 MG tablet Take 1 tablet (25 mg total) by mouth daily. 30 tablet 3  . zoster vaccine live, PF, (ZOSTAVAX) [id number] UNT/0.65ML injection Inject 19,400 Units into the skin once. 1 each 0   No current facility-administered medications for this visit.   Family History  Problem Relation Age of Onset  . Stroke Mother   . Cancer Father    History   Social History  . Marital Status: Married    Spouse Name: N/A    Number of Children: N/A  . Years of Education: 12   Occupational History  .  Unemployed   Social History Main Topics  . Smoking status: Former Smoker -- 0.50 packs/day for 50 years    Types: Cigarettes    Quit date: 04/01/2011  . Smokeless tobacco: Never Used  . Alcohol Use: No  . Drug Use: No  . Sexual Activity: No   Other Topics Concern  . Not on file   Social History Narrative   Review of Systems: Review of Systems  Constitutional: Negative for fever, chills and weight loss.  HENT: Negative for congestion.   Eyes: Negative for blurred vision.  Respiratory: Positive for shortness of  breath (with exertion ). Negative for cough and wheezing.   Cardiovascular: Negative for chest pain and leg swelling.  Gastrointestinal: Negative for nausea, vomiting, abdominal pain, diarrhea, constipation and blood in stool.  Genitourinary: Negative for urgency, frequency and hematuria.  Musculoskeletal: Positive for back pain (chronic low back ). Negative for myalgias.  Neurological: Negative for dizziness, sensory change and headaches.  Endo/Heme/Allergies: Negative for polydipsia.    Objective:  Physical Exam: Filed Vitals:   07/09/14 1419  BP: 146/53  Pulse: 71  Temp: 99 F (37.2 Mason)  TempSrc: Oral  Weight: 320 lb (145.151 kg)  SpO2: 98%    Physical Exam  Constitutional: She is oriented to person, place, and time. She appears well-developed and well-nourished. No distress.  HENT:  Head: Normocephalic and atraumatic.  Eyes: EOM are normal.  Neck: Normal range of motion. Neck supple.  Cardiovascular: Normal rate and regular rhythm.   Pulmonary/Chest: Effort normal and breath sounds normal. No respiratory distress. She has no wheezes. She has no rales.  Abdominal: Soft. Bowel sounds are normal. She exhibits no distension. There is no tenderness. There is no rebound and no guarding.  Musculoskeletal: Normal range of motion. She exhibits edema (trace b/l LE ). She exhibits no tenderness.  Neurological: She is alert and oriented to person, place, and time.  Skin: Skin is warm and dry. No rash noted. She is not diaphoretic. No erythema. No pallor.  Psychiatric: She has a normal mood and affect. Her behavior is normal. Judgment and thought content normal.    Assessment & Plan:   Please see problem list for problem-based assessment and plan

## 2014-07-10 NOTE — Assessment & Plan Note (Signed)
Assessment: Pt with poorly controlled hypertension compliant with four-class (CCB, BB, ACEi, and diuretic) anti-hypertensive therapy newly started on spironolactone for drug-resistant hypertension with no evidence of hyperaldosteronism who presents with significantly improved blood pressure of 146/53.   Plan:  -BP 146/53 not at goal <140/90, however much improved! -Continue spironolactone 25 mg daily, lisinopril-HCTZ 20-25 mg daily, atenolol 50 mg daily, and amlodipine 10 mg daily -Obtain BMP ----> normal K (3.6) with stable CKD Stage 2 (Cr 0.96 with GFR 69)

## 2014-07-10 NOTE — Assessment & Plan Note (Signed)
-  Pt will need PCV13 vaccination in December of 2016 -Pt given prescription for zostavax vaccination at last visit which she is to have administered at her pharmacy  -Pt declined screening colonoscopy, screening mammography, and DEXA scan at this time, inquire again at next visit -Obtain records from employee health for tetanus vaccination, she is unsure if it was within the past 10 years

## 2014-07-10 NOTE — Assessment & Plan Note (Addendum)
Assessment: Pt with newly diagnosed Type II DM with A1c of 6.6 on 03/29/14 compliant with oral hypoglycemic therapy with no symptomatic hypoglycemia who presents with CBG of 140 found to have improved A1c of 6.4.   Plan:  -A1c 6.4 at goal <7, pt would like to continue metformin 500 mg BID -BP 146/53, continue spironolactone 25 mg daily, lisinopril-HCTZ 20-25 mg daily, atenolol 50 mg daily, and amlodipine 10 mg daily  -LDL 128 not at goal <100, continue pravastatin 40 mg daily -Last annual eye exam on 05/30/14 was normal -Last annual foot exam on 05/30/14, pt follows with podiatry for nail care   -Last annual urine microalbumin test on 05/30/14 with 78.1 mg of protein, continue lisinopril 20 mg daily  -BMI 53.25 not at goal <30, encourage weight loss -Continue aspirin 81 mg daily for secondary CVD prevention

## 2014-08-18 ENCOUNTER — Telehealth: Payer: Self-pay | Admitting: *Deleted

## 2014-08-18 ENCOUNTER — Encounter (HOSPITAL_COMMUNITY): Payer: Self-pay | Admitting: Neurology

## 2014-08-18 ENCOUNTER — Observation Stay (HOSPITAL_COMMUNITY)
Admission: EM | Admit: 2014-08-18 | Discharge: 2014-08-19 | Disposition: A | Discharge status: Home / Self Care | Payer: Commercial Managed Care - HMO | Attending: Internal Medicine | Admitting: Internal Medicine

## 2014-08-18 DIAGNOSIS — M479 Spondylosis, unspecified: Secondary | ICD-10-CM | POA: Diagnosis not present

## 2014-08-18 DIAGNOSIS — I252 Old myocardial infarction: Secondary | ICD-10-CM | POA: Insufficient documentation

## 2014-08-18 DIAGNOSIS — N179 Acute kidney failure, unspecified: Secondary | ICD-10-CM | POA: Diagnosis not present

## 2014-08-18 DIAGNOSIS — R609 Edema, unspecified: Secondary | ICD-10-CM | POA: Diagnosis not present

## 2014-08-18 DIAGNOSIS — I2510 Atherosclerotic heart disease of native coronary artery without angina pectoris: Secondary | ICD-10-CM | POA: Diagnosis present

## 2014-08-18 DIAGNOSIS — I129 Hypertensive chronic kidney disease with stage 1 through stage 4 chronic kidney disease, or unspecified chronic kidney disease: Secondary | ICD-10-CM | POA: Insufficient documentation

## 2014-08-18 DIAGNOSIS — E119 Type 2 diabetes mellitus without complications: Secondary | ICD-10-CM | POA: Diagnosis not present

## 2014-08-18 DIAGNOSIS — E1122 Type 2 diabetes mellitus with diabetic chronic kidney disease: Secondary | ICD-10-CM | POA: Diagnosis present

## 2014-08-18 DIAGNOSIS — Z87891 Personal history of nicotine dependence: Secondary | ICD-10-CM | POA: Diagnosis not present

## 2014-08-18 DIAGNOSIS — E785 Hyperlipidemia, unspecified: Secondary | ICD-10-CM | POA: Diagnosis present

## 2014-08-18 DIAGNOSIS — K146 Glossodynia: Secondary | ICD-10-CM | POA: Diagnosis not present

## 2014-08-18 DIAGNOSIS — N182 Chronic kidney disease, stage 2 (mild): Secondary | ICD-10-CM | POA: Insufficient documentation

## 2014-08-18 DIAGNOSIS — T783XXA Angioneurotic edema, initial encounter: Principal | ICD-10-CM | POA: Diagnosis present

## 2014-08-18 DIAGNOSIS — D649 Anemia, unspecified: Secondary | ICD-10-CM | POA: Diagnosis present

## 2014-08-18 DIAGNOSIS — I10 Essential (primary) hypertension: Secondary | ICD-10-CM | POA: Diagnosis present

## 2014-08-18 MED ORDER — ENOXAPARIN SODIUM 40 MG/0.4ML ~~LOC~~ SOLN
40.0000 mg | Freq: Every day | SUBCUTANEOUS | Status: DC
  Administered 2014-08-19: 40 mg via SUBCUTANEOUS
  Filled 2014-08-18: qty 0.4

## 2014-08-18 MED ORDER — ALBUTEROL SULFATE (2.5 MG/3ML) 0.083% IN NEBU
2.5000 mg | INHALATION_SOLUTION | Freq: Four times a day (QID) | RESPIRATORY_TRACT | Status: DC | PRN
  Administered 2014-08-19: 2.5 mg via RESPIRATORY_TRACT
  Filled 2014-08-18: qty 3

## 2014-08-18 MED ORDER — FAMOTIDINE IN NACL 20-0.9 MG/50ML-% IV SOLN
20.0000 mg | Freq: Once | INTRAVENOUS | Status: AC
  Administered 2014-08-18: 20 mg via INTRAVENOUS
  Filled 2014-08-18: qty 50

## 2014-08-18 MED ORDER — METHYLPREDNISOLONE SODIUM SUCC 125 MG IJ SOLR
125.0000 mg | Freq: Once | INTRAMUSCULAR | Status: AC
  Administered 2014-08-18: 125 mg via INTRAVENOUS
  Filled 2014-08-18: qty 2

## 2014-08-18 MED ORDER — DIPHENHYDRAMINE HCL 50 MG/ML IJ SOLN
50.0000 mg | Freq: Once | INTRAMUSCULAR | Status: AC
  Administered 2014-08-18: 50 mg via INTRAVENOUS
  Filled 2014-08-18: qty 1

## 2014-08-18 NOTE — ED Notes (Signed)
Per EMS- Pt reports today at 1300 developed swelling to her tongue. Denies new encounters. Reports hx of same 2 years ago, unsure of cause. Pt is a x 4. No respiratory distress. BP 180/118, HR 94, 95% RA.

## 2014-08-18 NOTE — Telephone Encounter (Signed)
Agree with ED appt. On ACEI

## 2014-08-18 NOTE — ED Notes (Signed)
Pt tongue appears more swollen than prior. Resident to bedside to evaluate. Pt sitting on side of bed, no respiratory distress, is drooling.

## 2014-08-18 NOTE — ED Provider Notes (Signed)
CSN: [id number]     Arrival date & time 08/18/14  1700 History   First MD Initiated Contact with Patient 08/18/14 1706     Chief Complaint  Patient presents with  . Oral Swelling     (Consider location/radiation/quality/duration/timing/severity/associated sxs/prior Treatment) The history is provided by the patient.    74 yo AAF with PMHx of HTN, HLD, CAD, on lisinopril, who presents with acute onset tongue swelling. History is limited at this time due to patient's marked tongue swelling, but per EMS and obtainable history the patient was in her usual state of health until approximately 1300 today at which time she developed acute onset of swelling of her tongue. She has a history of similar swelling 2 years ago but is unsure of what caused to this. She has had progressive tongue swelling since then. Denies any shortness of breath and she is tolerating secretions. Denies any family history of angioedema. She states she has been taking her medications as prescribed with no recent med changes, but she does take lisinopril. No new or recent allergen exposures and she denies any rash or urticaria. No recent trauma. No fevers or chills. She denies any known alleviating or aggravating factors.  Past Medical History  Diagnosis Date  . Hypertension   . Non-ST elevation myocardial infarction (NSTEMI), initial episode of care 05/02/2011  . Hyperlipidemia   . Lumbar disc disease   . Morbid Obesity 05/02/2011  . CAD (coronary artery disease) 05/04/2011  . Diabetes mellitus without complication    Past Surgical History  Procedure Laterality Date  . None    . Left heart catheterization with coronary angiogram N/A 05/03/2011    Procedure: LEFT HEART CATHETERIZATION WITH CORONARY ANGIOGRAM;  Surgeon: Othella BoyerWilliam S Tilley, MD;  [facility];  Service: Cardiovascular;  Laterality: N/A;   Family History  Problem Relation Age of Onset  . Stroke Mother   . Cancer Father    History  Substance Use  Topics  . Smoking status: Former Smoker -- 0.50 packs/day for 50 years    Types: Cigarettes    Quit date: 04/01/2011  . Smokeless tobacco: Never Used  . Alcohol Use: No   OB History    No data available     Review of Systems  Constitutional: Negative for fever.  HENT: Positive for drooling, facial swelling, trouble swallowing and voice change. Negative for ear pain and sore throat.   Eyes: Negative for visual disturbance.  Respiratory: Negative for cough, shortness of breath and wheezing.   Cardiovascular: Negative for chest pain and leg swelling.  Gastrointestinal: Negative for nausea, vomiting, abdominal pain and diarrhea.  Genitourinary: Negative for flank pain.  Musculoskeletal: Negative for neck pain and neck stiffness.  Skin: Negative for rash.  Neurological: Negative for dizziness, syncope, weakness and headaches.      Allergies  Penicillins  Home Medications   Prior to Admission medications   Medication Sig Start Date End Date Taking? Authorizing Provider  amLODipine (NORVASC) 10 MG tablet Take 1 tablet (10 mg total) by mouth daily. 03/19/14 03/19/15 Yes Marjan Rabbani, MD  aspirin EC 81 MG tablet Take 1 tablet (81 mg total) by mouth daily. 03/19/14 03/19/15 Yes Marjan Rabbani, MD  atenolol (TENORMIN) 50 MG tablet Take 1 tablet (50 mg total) by mouth daily. 03/19/14 03/19/15 Yes Marjan Rabbani, MD  ibuprofen (ADVIL,MOTRIN) 200 MG tablet Take 200 mg by mouth every 6 (six) hours as needed for moderate pain.   Yes Historical Provider, MD  metFORMIN (GLUCOPHAGE) 500  MG tablet Take 1 tablet (500 mg total) by mouth 2 (two) times daily with a meal. 03/22/14 03/22/15 Yes Marjan Rabbani, MD  pravastatin (PRAVACHOL) 40 MG tablet Take 1 tablet (40 mg total) by mouth daily. 03/19/14 03/19/15 Yes Marjan Rabbani, MD  spironolactone (ALDACTONE) 25 MG tablet Take 1 tablet (25 mg total) by mouth daily. 05/30/14 05/30/15 Yes Marjan Rabbani, MD  albuterol (PROVENTIL HFA;VENTOLIN HFA) 108 (90  BASE) MCG/ACT inhaler Inhale 2 puffs into the lungs every 6 (six) hours as needed for wheezing or shortness of breath. 03/19/14   Otis Brace, MD  nitroGLYCERIN (NITROSTAT) 0.4 MG SL tablet Place 1 tablet (0.4 mg total) under the tongue every 5 (five) minutes as needed for chest pain. 05/04/11 05/03/12  Othella Boyer, MD  zoster vaccine live, PF, (ZOSTAVAX) [id number] UNT/0.65ML injection Inject 19,400 Units into the skin once. 05/30/14   Marjan Rabbani, MD   BP 199/59 mmHg  Pulse 103  Temp(Src) 99.1 F (37.3 C) (Oral)  Resp 29  Ht  (1.651 m)  Wt 320 lb 15.8 oz (145.6 kg)  BMI 53.42 kg/m2  SpO2 95% Physical Exam  Constitutional: She is oriented to person, place, and time. She appears well-developed and well-nourished. No distress.  HENT:  Head: Normocephalic and atraumatic.  Marked angioedema of the tongue with minimal involvement of the lips. The visualized posterior pharynx is without swelling and uvula appears midline and nonedematous. She has small amount of drooling from her enlarged tongue, which is protruding slightly out of the mouth, but is able to swallow her secretions.  Eyes: Conjunctivae are normal. Pupils are equal, round, and reactive to light.  Neck: Normal range of motion. Neck supple.  Mild swelling of the superior neck from lingual swelling but with no tenderness or induration. No stridor.  Cardiovascular: Normal rate, normal heart sounds and intact distal pulses.  Exam reveals no friction rub.   No murmur heard. Pulmonary/Chest: Effort normal and breath sounds normal. No respiratory distress. She has no wheezes. She has no rales.  Abdominal: Soft. Bowel sounds are normal. She exhibits no distension. There is no tenderness.  Musculoskeletal: She exhibits no edema.  Neurological: She is alert and oriented to person, place, and time.  Skin: Skin is warm. No rash noted.  Nursing note and vitals reviewed.   ED Course  Procedures (including critical care time) Labs  Review Labs Reviewed  CBC WITH DIFFERENTIAL/PLATELET - Abnormal; Notable for the following:    WBC 12.9 (*)    Neutrophils Relative % 88 (*)    Neutro Abs 11.2 (*)    Monocytes Relative 0 (*)    Monocytes Absolute 0.0 (*)    All other components within normal limits  MRSA PCR SCREENING  COMPREHENSIVE METABOLIC PANEL  MAGNESIUM  PHOSPHORUS  HIV ANTIBODY (ROUTINE TESTING)    Imaging Review No results found.   EKG Interpretation None      MDM   74 yo AAF with PMHx of HTN, HLD, CAD who presents with marked tongue swelling with onset 13:00. See HPI above. On arrival, HR 81, RR 21, BP 186/84, satting 96% on RA. Exam as above, pt overall well-appearing, in NAD, but with marked tongue swelling protruding beyond the lips but with patent posterior pharynx and no stridor. Remainder as above.  Pt's presentation is most c/w idiopathic versus likely ACEI-related angioedema, and pt is on lisinopril. No recent allergen triggers, no urticaria, no tachycardia, no s/s systemic anaphylaxis to suggest allergic angioedema component but will tx empirically  with solumedrol, zantac, benadryl. No family h/o angioedema to suggest esterase deficiency. No abdominal pain, nausea, vomiting to suggest bowel wall edema. Airway widely patent at this time. Will monitor.  Swelling persists despite >4 hours of observation. Airway remains patent with no stridor and pt denies any SOB, but tongue swelling appears stable to mildly worsened. Will admit to [facility] for close monitoring of airway status. Medicine in agreement.  Clinical Impression: 1. Angioedema, initial encounter     Disposition: Admit  Condition: Stable  Pt seen in conjunction with Dr. Regina Eck, MD 08/19/14 4098  Dione Booze, MD 08/19/14 [phone]

## 2014-08-18 NOTE — ED Notes (Signed)
MD at bedside. 

## 2014-08-18 NOTE — Telephone Encounter (Signed)
Pt's daughter calls and states that her mother's tongue started swelling at about 1300 today and is getting worse, states no new medicine or foods, she is advised to get pt to ED or urg care asap and not hesitate due to possibility of airway blockage occuring she states she is without a car at this time and will call 911, this agreed on

## 2014-08-18 NOTE — H&P (Signed)
Date: 08/19/2014               Patient Name:  Jamie Mason MRN: [id number]  DOB: 09/15/1940 Age / Sex: 74 y.o., female   PCP: Burns SpainElizabeth A Butcher, MD         Medical Service: Internal Medicine Teaching Service         Attending Physician: Dr. Earl LagosNischal Narendra, MD    First Contact: Dr. Jill AlexandersAlexa Mason  Pager: [phone]  Second Contact: Dr. Inocente SallesEjiro Mason  Pager: [phone]       After Hours (After 5p/  First Contact Pager: [phone]  weekends / holidays): Second Contact Pager: [phone]   Chief Complaint: tongue swelling  History of Present Illness: Miss Jamie Mason is a 74 year old female with hypertension, type 2 diabetes, hyperlipidemia, coronary artery disease complicated by a history of NSTEMI, lumbar disc disease who presented to the ED with tongue swelling.  Yesterday around 1:00 PM, she reports the onset of tongue swelling, trouble breathing, drooling. She reports that she had similar symptoms 2-3 years ago and was given some pills by an ENT doctor with resolution of symptoms within the next 24 hours. She also reports having similar symptoms 3 months ago but that they resolved independently without treatment and thus did not think much of it. This appears to be around the time when she was started on HCTZ/lisinopril 12.5/10 mg on 10 7 by her then PCP, Jamie Mason. Otherwise, she denies hives, eye/lip swelling, nausea, vomiting, abdominal pain, recent illness, fever, any new food exposure, recent use of penicillins [to which she has an allergy], a recent exposure to the outdoors. He has no known family history of similar symptoms. In the ED, she was given methylprednisolone 125 mg IV, Benadryl 50 mg IV, Pepcid 20 mg IV.   Meds: Current Facility-Administered Medications  Medication Dose Route Frequency Provider Last Rate Last Dose  . albuterol (PROVENTIL) (2.5 MG/3ML) 0.083% nebulizer solution 2.5 mg  2.5 mg Inhalation Q6H PRN Jamie BraceMarjan Rabbani, MD   2.5 mg at 08/19/14 0017  . diphenhydrAMINE  (BENADRYL) injection 25 mg  25 mg Intravenous Q4H PRN Marjan Rabbani, MD      . diphenhydrAMINE (BENADRYL) injection 50 mg  50 mg Intravenous Once Marjan Rabbani, MD      . enoxaparin (LOVENOX) injection 40 mg  40 mg Subcutaneous Daily Marjan Rabbani, MD      . famotidine (PEPCID) IVPB 20 mg  20 mg Intravenous Once Jamie BraceMarjan Rabbani, MD        Allergies: Allergies as of 08/18/2014 - Review Complete 08/18/2014  Allergen Reaction Noted  . Penicillins Itching and Rash 05/02/2011   Past Medical History  Diagnosis Date  . Hypertension   . Non-ST elevation myocardial infarction (NSTEMI), initial episode of care 05/02/2011  . Hyperlipidemia   . Lumbar disc disease   . Morbid Obesity 05/02/2011  . CAD (coronary artery disease) 05/04/2011  . Diabetes mellitus without complication    Past Surgical History  Procedure Laterality Date  . None    . Left heart catheterization with coronary angiogram N/A 05/03/2011    Procedure: LEFT HEART CATHETERIZATION WITH CORONARY ANGIOGRAM;  Surgeon: Othella BoyerWilliam S Tilley, MD;  Location: [facility];  Service: Cardiovascular;  Laterality: N/A;   Family History  Problem Relation Age of Onset  . Stroke Mother   . Cancer Father    History   Social History  . Marital Status: Married    Spouse Name: N/A  . Number of Children: N/A  .  Years of Education: 12   Occupational History  .  Unemployed   Social History Main Topics  . Smoking status: Former Smoker -- 0.50 packs/day for 50 years    Types: Cigarettes    Quit date: 04/01/2011  . Smokeless tobacco: Never Used  . Alcohol Use: No  . Drug Use: No  . Sexual Activity: No   Other Topics Concern  . Not on file   Social History Narrative    Review of Systems: Review of Systems  Constitutional: Negative for fever.  Respiratory: Negative for shortness of breath and wheezing.   Cardiovascular: Negative for chest pain.  Gastrointestinal: Negative for nausea, vomiting, abdominal pain and diarrhea.    Skin: Negative for rash.     Physical Exam: Blood pressure 181/55, pulse 77, temperature 99.1 F (37.3 C), temperature source Oral, resp. rate 19, height  (1.651 m), weight 320 lb 15.8 oz (145.6 kg), SpO2 90 %. General: Elderly African-American female, resting in bed, NAD HEENT: PERRL, EOMI, no scleral icterus, swollen tongue without lip swelling thus limits visualization the posterior oropharynx Cardiac: RRR, no rubs, murmurs or gallops auscultation limited by large body habitus Pulm: clear to auscultation bilaterally, no wheezes, rales, or rhonchi Abd: soft, nontender, nondistended, BS present, subumbilical lesion noted that appears to be a scar though she reports that she has not had any prior surgical procedure Ext: warm and well perfused, no pedal or tibial edema Neuro: responds to questions appropriately; moving all extremities freely   Lab results: Basic Metabolic Panel:  Recent Labs  16/10/96 0005  NA 137  K 4.7  CL 100  CO2 25  GLUCOSE 191*  BUN 20  CREATININE 1.23*  CALCIUM 9.7  MG 1.8  PHOS 3.5   Liver Function Tests:  Recent Labs  08/19/14 0005  AST 33  ALT 19  ALKPHOS 100  BILITOT 1.3*  PROT 9.1*  ALBUMIN 3.8    CBC:  Recent Labs  08/19/14 0005  WBC 12.9*  NEUTROABS 11.2*  HGB 13.9  HCT 41.2  MCV 86.9  PLT 379   Assessment & Plan by Problem:  Angioedema: Likely related to her ACE inhibitor though unclear why she had her first episode of these symptoms. Other things to consider would be a viral illness or hereditary component of which she has no knowledge. Bowel edema is sometimes associated with ACE inhibitor angioedema though less likely in the absence of abdominal pain. -Discontinue ACE inhibitor -Give Pepcid 20 mg IV and Benadryl 25 mg IV -Check CBC with differential to assess for eosinophils -Check  tryptase to confirm allergic reaction she has had prior episodes of similar symptoms without any specific inciting factor that she  can pinpoint -Check C4 complement to rule out C1 inhibitor deficiency -Consider imaging to assess for bowel edema as it has been associated with ACE inhibitor angioedema -Continue albuterol nebulizer every 6 hours as needed for shortness of breath  Acute on chronic kidney disease stage II: Creatinine 1.2 up from 1.0 on 07/09/14 with baseline of 1.0. This drop is consistent with a 27.5% decrease in her GFR -Defer IV fluids in the setting of her angioedema -Check magnesium and phosphorus  Hypertension: Systolic blood pressure has been trending 170s to 190s. Home meds including amlodipine 10 mg, Aldactone 25 mg, lisinopril/HCTZ 10/12.5 mg. -Give Lopressor 5 mg IV as she cannot tolerate any oral medications in the setting of her tongue swelling   Type 2 diabetes: A1c 6.4 on 07/09/14. Home meds include metformin 500 mg  twice a day. Glucose 191 on CMET. -Start SSI-S until she is tolerating oral intake  Coronary artery disease complicated by a history of an STEMI: -Hold aspirin 81 mg -Hold pravastatin 40 mg  Lumbar disc disease: Holding ibuprofen.  #FEN:  -Diet: Nothing by mouth  #DVT prophylaxis: Lovenox  #CODE STATUS: FULL CODE -Defer to husband [place] if patients lacks decision-making capacity -Confirmed with patient on admission   Dispo: Disposition is deferred at this time, awaiting improvement of current medical problems.   The patient does have a current PCP Burns Spain, MD) and does need an [facility] hospital follow-up appointment after discharge.  The patient does not know have transportation limitations that hinder transportation to clinic appointments.  Signed: Beather Arbour, MD 08/19/2014, 3:03 AM

## 2014-08-19 DIAGNOSIS — I2510 Atherosclerotic heart disease of native coronary artery without angina pectoris: Secondary | ICD-10-CM | POA: Diagnosis not present

## 2014-08-19 DIAGNOSIS — N182 Chronic kidney disease, stage 2 (mild): Secondary | ICD-10-CM | POA: Diagnosis not present

## 2014-08-19 DIAGNOSIS — T783XXA Angioneurotic edema, initial encounter: Secondary | ICD-10-CM | POA: Diagnosis not present

## 2014-08-19 DIAGNOSIS — M479 Spondylosis, unspecified: Secondary | ICD-10-CM | POA: Diagnosis not present

## 2014-08-19 DIAGNOSIS — I129 Hypertensive chronic kidney disease with stage 1 through stage 4 chronic kidney disease, or unspecified chronic kidney disease: Secondary | ICD-10-CM | POA: Diagnosis not present

## 2014-08-19 DIAGNOSIS — E119 Type 2 diabetes mellitus without complications: Secondary | ICD-10-CM | POA: Diagnosis not present

## 2014-08-19 DIAGNOSIS — E785 Hyperlipidemia, unspecified: Secondary | ICD-10-CM | POA: Diagnosis not present

## 2014-08-19 DIAGNOSIS — I10 Essential (primary) hypertension: Secondary | ICD-10-CM

## 2014-08-19 DIAGNOSIS — I252 Old myocardial infarction: Secondary | ICD-10-CM | POA: Diagnosis not present

## 2014-08-19 LAB — COMPREHENSIVE METABOLIC PANEL
ALK PHOS: 100 U/L (ref 39–117)
ALT: 19 U/L (ref 0–35)
ANION GAP: 12 (ref 5–15)
AST: 33 U/L (ref 0–37)
Albumin: 3.8 g/dL (ref 3.5–5.2)
BUN: 20 mg/dL (ref 6–23)
CALCIUM: 9.7 mg/dL (ref 8.4–10.5)
CHLORIDE: 100 mmol/L (ref 96–112)
CO2: 25 mmol/L (ref 19–32)
Creatinine, Ser: 1.23 mg/dL — ABNORMAL HIGH (ref 0.50–1.10)
GFR, EST AFRICAN AMERICAN: 50 mL/min — AB (ref >90)
GFR, EST NON AFRICAN AMERICAN: 43 mL/min — AB (ref >90)
GLUCOSE: 191 mg/dL — AB (ref 70–99)
Potassium: 4.7 mmol/L (ref 3.5–5.1)
Sodium: 137 mmol/L (ref 135–145)
Total Bilirubin: 1.3 mg/dL — ABNORMAL HIGH (ref 0.3–1.2)
Total Protein: 9.1 g/dL — ABNORMAL HIGH (ref 6.0–8.3)

## 2014-08-19 LAB — CBC WITH DIFFERENTIAL/PLATELET
BASOS ABS: 0 10*3/uL (ref 0.0–0.1)
Basophils Relative: 0 % (ref 0–1)
Eosinophils Absolute: 0 10*3/uL (ref 0.0–0.7)
Eosinophils Relative: 0 % (ref 0–5)
HEMATOCRIT: 41.2 % (ref 36.0–46.0)
HEMOGLOBIN: 13.9 g/dL (ref 12.0–15.0)
Lymphocytes Relative: 12 % (ref 12–46)
Lymphs Abs: 1.6 10*3/uL (ref 0.7–4.0)
MCH: 29.3 pg (ref 26.0–34.0)
MCHC: 33.7 g/dL (ref 30.0–36.0)
MCV: 86.9 fL (ref 78.0–100.0)
MONO ABS: 0 10*3/uL — AB (ref 0.1–1.0)
Monocytes Relative: 0 % — ABNORMAL LOW (ref 3–12)
NEUTROS ABS: 11.2 10*3/uL — AB (ref 1.7–7.7)
Neutrophils Relative %: 88 % — ABNORMAL HIGH (ref 43–77)
PLATELETS: 379 10*3/uL (ref 150–400)
RBC: 4.74 MIL/uL (ref 3.87–5.11)
RDW: 14.9 % (ref 11.5–15.5)
WBC: 12.9 10*3/uL — ABNORMAL HIGH (ref 4.0–10.5)

## 2014-08-19 LAB — GLUCOSE, CAPILLARY
GLUCOSE-CAPILLARY: 176 mg/dL — AB (ref 70–99)
Glucose-Capillary: 156 mg/dL — ABNORMAL HIGH (ref 70–99)

## 2014-08-19 LAB — HIV ANTIBODY (ROUTINE TESTING W REFLEX): HIV Screen 4th Generation wRfx: NONREACTIVE

## 2014-08-19 LAB — PHOSPHORUS: Phosphorus: 3.5 mg/dL (ref 2.3–4.6)

## 2014-08-19 LAB — MRSA PCR SCREENING: MRSA BY PCR: NEGATIVE

## 2014-08-19 LAB — MAGNESIUM: Magnesium: 1.8 mg/dL (ref 1.5–2.5)

## 2014-08-19 MED ORDER — METOPROLOL TARTRATE 1 MG/ML IV SOLN
INTRAVENOUS | Status: AC
Start: 2014-08-19 — End: 2014-08-19
  Filled 2014-08-19: qty 5

## 2014-08-19 MED ORDER — HYDROCHLOROTHIAZIDE 25 MG PO TABS: 25.0000 mg | ORAL_TABLET | Freq: Every day | ORAL | Status: DC

## 2014-08-19 MED ORDER — DIPHENHYDRAMINE HCL 50 MG/ML IJ SOLN
50.0000 mg | Freq: Once | INTRAMUSCULAR | Status: AC
  Administered 2014-08-19: 50 mg via INTRAVENOUS
  Filled 2014-08-19: qty 1

## 2014-08-19 MED ORDER — METOPROLOL TARTRATE 1 MG/ML IV SOLN: 5.0000 mg | INTRAVENOUS | Status: DC | PRN

## 2014-08-19 MED ORDER — METOPROLOL TARTRATE 1 MG/ML IV SOLN: 5.0000 mg | Freq: Once | INTRAVENOUS | Status: DC

## 2014-08-19 MED ORDER — INSULIN ASPART 100 UNIT/ML ~~LOC~~ SOLN
0.0000 [IU] | Freq: Three times a day (TID) | SUBCUTANEOUS | Status: DC
  Administered 2014-08-19 (×2): 2 [IU] via SUBCUTANEOUS

## 2014-08-19 MED ORDER — METOPROLOL TARTRATE 1 MG/ML IV SOLN
5.0000 mg | Freq: Once | INTRAVENOUS | Status: AC
  Administered 2014-08-19: 5 mg via INTRAVENOUS

## 2014-08-19 MED ORDER — NOREPINEPHRINE BITARTRATE 1 MG/ML IV SOLN
0.0000 ug/min | INTRAVENOUS | Status: DC
  Filled 2014-08-19: qty 4

## 2014-08-19 MED ORDER — FAMOTIDINE IN NACL 20-0.9 MG/50ML-% IV SOLN
20.0000 mg | Freq: Once | INTRAVENOUS | Status: AC
  Administered 2014-08-19: 20 mg via INTRAVENOUS
  Filled 2014-08-19: qty 50

## 2014-08-19 MED ORDER — SODIUM CHLORIDE 0.9 % IV BOLUS (SEPSIS): 250.0000 mL | Freq: Once | INTRAVENOUS | Status: DC

## 2014-08-19 MED ORDER — DIPHENHYDRAMINE HCL 50 MG/ML IJ SOLN: 25.0000 mg | INTRAMUSCULAR | Status: DC | PRN

## 2014-08-19 MED ORDER — METOPROLOL TARTRATE 1 MG/ML IV SOLN
5.0000 mg | INTRAVENOUS | Status: DC | PRN
  Administered 2014-08-19: 5 mg via INTRAVENOUS
  Filled 2014-08-19: qty 5

## 2014-08-19 NOTE — Progress Notes (Signed)
Patient seen and examined with Dr. Senaida Oresichardson. Case d/w residents in detail.  Patient is a 74 y/o female with PMH of HTN, DM, HLD, CAD s/p MI who p/w tongue swelling. Patent states that yesterday afternoon she noted swelling of her tongue and difficulty swallowing and drooling. Patient has had 2 episodes prior to this - for one she went to an ENT doctor (3 months ago) and it resolved with pills and another more recently which resolved spontaneously in an hour. Symptoms seem to correlate with adding of lisinopril to her BP regimen. She denies CP. No SOB, no wheezes, no hives, no fevers, no abd pain, no n/v, no diarrhea. Patient is much improved today and able to swallow her saliva.   Physical Exam: Gen: AAO*3, NAD CVS: RRR, normal heart sounds Pulm: CTA b/l Abd: soft, non tender, BS + Ext: no edema HEENT: mild swelling of her tongue, no lip edema  Assessment and Plan: 74 y/o female with likely ACE-I induced angioedema  Angioedema: - Symptoms now resolving. - Avoid ACE-I in the future - f/u tryptase and C4 levels - NPO for now. Will attempt bedside swallow test again later today  AKI on CKD: - likely prerenal - Will monitor  Uncontrolled HTN: - resume PO meds (except ACE-I) once tolerating PO - c/w lopressor prn for now  DM: - BS controlled. Will monitor on current regimen

## 2014-08-19 NOTE — Progress Notes (Signed)
Subjective:  Patient was seen and examined this morning. Patient states tongue swelling has improved, but she does not feel back to her baseline. She denies shortness of breath, wheezing, lip swelling, rash, hives, or itching. Her only symptom was the tongue swelling and trouble swallowing. She did have this symptom more mildly 2-3 months ago. She was started on lisinopril in September 2015.   Objective: Vital signs in last 24 hours: Filed Vitals:   08/19/14 0442 08/19/14 0500 08/19/14 0600 08/19/14 0700  BP: 181/65 193/46 214/74 203/41  Pulse: 74 70 71 65  Temp:      TempSrc:      Resp: Height:      Weight:      SpO2: 95% 94% 94% 96%   Weight change:   Intake/Output Summary (Last 24 hours) at 08/19/14 0753 Last data filed at 08/19/14 0316  Gross per 24 hour  Intake     50 ml  Output    400 ml  Net   -350 ml   General: Vital signs reviewed.  Patient is well-developed and well-nourished, in no acute distress and cooperative with exam.  HEENT: Mallampati class II, mild angioedema of tongue, improved from prior description, no stridor, no lip edema or uvula edema. Cardiovascular: RRR Pulmonary/Chest: Clear to auscultation bilaterally, no wheezes, rales, or rhonchi. Abdominal: Soft, non-tender, non-distended, BS + Extremities: Trace pitting edema bilaterally Skin: Warm, dry and intact. No rashes or erythema. Psychiatric: Normal mood and affect. speech and behavior is normal.   Lab Results: Basic Metabolic Panel:  Recent Labs Lab 08/19/14 0005  NA 137  K 4.7  CL 100  CO2 25  GLUCOSE 191*  BUN 20  CREATININE 1.23*  CALCIUM 9.7  MG 1.8  PHOS 3.5   Liver Function Tests:  Recent Labs Lab 08/19/14 0005  AST 33  ALT 19  ALKPHOS 100  BILITOT 1.3*  PROT 9.1*  ALBUMIN 3.8   CBC:  Recent Labs Lab 08/19/14 0005  WBC 12.9*  NEUTROABS 11.2*  HGB 13.9  HCT 41.2  MCV 86.9  PLT 379   Micro Results: Recent Results (from the past 240 hour(s))    MRSA PCR Screening     Status: None   Collection Time: 08/18/14 11:26 PM  Result Value Ref Range Status   MRSA by PCR NEGATIVE NEGATIVE Final    Comment:        The GeneXpert MRSA Assay (FDA approved for NASAL specimens only), is one component of a comprehensive MRSA colonization surveillance program. It is not intended to diagnose MRSA infection nor to guide or monitor treatment for MRSA infections.    Medications:  I have reviewed the patient's current medications. Prior to Admission:  Prescriptions prior to admission  Medication Sig Dispense Refill Last Dose  . amLODipine (NORVASC) 10 MG tablet Take 1 tablet (10 mg total) by mouth daily. 90 tablet 3 08/18/2014 at Unknown time  . aspirin EC 81 MG tablet Take 1 tablet (81 mg total) by mouth daily. 90 tablet 3 08/18/2014 at Unknown time  . atenolol (TENORMIN) 50 MG tablet Take 1 tablet (50 mg total) by mouth daily. 90 tablet 3 08/18/2014 at 1000  . ibuprofen (ADVIL,MOTRIN) 200 MG tablet Take 200 mg by mouth every 6 (six) hours as needed for moderate pain.   Past Month at Unknown time  . metFORMIN (GLUCOPHAGE) 500 MG tablet Take 1 tablet (500 mg total) by mouth 2 (two) times daily with a meal. 60  tablet 3 08/18/2014 at am  . pravastatin (PRAVACHOL) 40 MG tablet Take 1 tablet (40 mg total) by mouth daily. 90 tablet 3 08/18/2014 at Unknown time  . spironolactone (ALDACTONE) 25 MG tablet Take 1 tablet (25 mg total) by mouth daily. 30 tablet 3 08/18/2014 at Unknown time  . [DISCONTINUED] lisinopril-hydrochlorothiazide (PRINZIDE,ZESTORETIC) 10-12.5 MG per tablet Take 2 tablets by mouth daily. 60 tablet 3 08/18/2014 at Unknown time  . albuterol (PROVENTIL HFA;VENTOLIN HFA) 108 (90 BASE) MCG/ACT inhaler Inhale 2 puffs into the lungs every 6 (six) hours as needed for wheezing or shortness of breath. 1 Inhaler 3 not used  . nitroGLYCERIN (NITROSTAT) 0.4 MG SL tablet Place 1 tablet (0.4 mg total) under the tongue every 5 (five) minutes as needed for chest  pain. 25 tablet 12   . zoster vaccine live, PF, (ZOSTAVAX) [id number] UNT/0.65ML injection Inject 19,400 Units into the skin once. 1 each 0 Taking   Scheduled Meds: . enoxaparin (LOVENOX) injection  40 mg Subcutaneous Daily  . insulin aspart  0-9 Units Subcutaneous TID WC   Continuous Infusions:  PRN Meds:.albuterol, diphenhydrAMINE, metoprolol Assessment/Plan: Principal Problem:   Angioedema Active Problems:   Hypertension   Hyperlipidemia   CAD (coronary artery disease)   Type 2 diabetes mellitus with stage 2 chronic kidney disease   Normocytic anemia  ACEI-Induced Angioedema: Tongue edema is improving. Likely secondary to lisinopril. Patient was on lisinopril 20 mg daily at home which was started back in September of 2015. Patient was given Solumedrol 125 mg IV once, pepcid 20 mg IV and benadryl 50 mg IV once overnight. WBC elevated at 12.9 possibly secondary to steroids, but increased neutrophils without eosinophils. Swallow screen attempted with water, but patient noted discomfort on the back of her throat. We will re-attempt a swallow screen later today. If patient passes swallow screen and can tolerate foods, she can be discharged to home later today. -Discontinued ACE inhibitor  -Repeat CBC tomorrow am -Tryptase pending to confirm allergic reaction -C4 complement pending to rule out C1 inhibitor deficiency -Albuterol nebulizer Q6H prn -NPO for now  Acute on Chronic Kidney Disease Stage II: Creatinine 1.2 up from 1.0 on 07/09/14 with baseline of 1.0. Magnesium and phosphorus normal. -Repeat BMET tomorrow am  Hypertension: 162/102. Home meds including amlodipine 10 mg, Aldactone 25 mg, lisinopril/HCTZ 10/12.5 mg. -Give Lopressor 5 mg IV prn >180/110 -Resume amlodipine, aldactone and HCTZ once tolerating po  Type 2 Diabetes: A1c 6.4 on 07/09/14. Home meds include metformin 500 mg twice a day.  -SSI-S -CBG AC/HS -NPO for now  CAD with h/o STEMI: Patient is on ASA and pravastatin  at home. Holding medications while she is NPO. -Hold aspirin 81 mg -Hold pravastatin 40 mg  Lumbar disc disease: On ibuprofen at home, holding for now. -Monitor  FEN:  -Diet: NPO  DVT prophylaxis: Lovenox SQ QD  CODE STATUS: FULL CODE -Defer to husband [place] if patients lacks decision-making capacity -Confirmed with patient on admission  Dispo: Disposition is deferred at this time, awaiting improvement of current medical problems.  Anticipated discharge in approximately 1 day(s).   The patient does have a current PCP Katrinia Straker Spain(Elizabeth A Butcher, MD) and [facility] after discharge.  The patient does not have transportation limitations that hinder transportation to clinic appointments.  .Services Needed at time of discharge: Y = Yes, Blank = No PT:   OT:   RN:   Equipment:   Other:     LOS: 1 day  Jill Alexanders, DO PGY-1 Internal Medicine Resident Pager # [phone] 08/19/2014 7:53 AM

## 2014-08-19 NOTE — Progress Notes (Signed)
Still complaining of soreness at the back of her tongue when drinking water, MD aware.

## 2014-08-19 NOTE — Progress Notes (Signed)
Complained of soreness on her throat when drinking water. MD aware.

## 2014-08-19 NOTE — Discharge Summary (Signed)
Name: Jamie Mason MRN: [id number] DOB: [birth year] 74 y.o. PCP: Burns Spain, MD  Date of Admission: 08/18/2014  5:00 PM Date of Discharge: 08/19/2014 Attending Physician: Earl Lagos, MD  Discharge Diagnosis:  Principal Problem:   Angioedema Active Problems:   Hypertension   Hyperlipidemia   CAD (coronary artery disease)   Type 2 diabetes mellitus with stage 2 chronic kidney disease   Normocytic anemia  Discharge Medications:   Medication List    STOP taking these medications        nitroGLYCERIN 0.4 MG SL tablet  Commonly known as:  NITROSTAT      TAKE these medications        albuterol 108 (90 BASE) MCG/ACT inhaler  Commonly known as:  PROVENTIL HFA;VENTOLIN HFA  Inhale 2 puffs into the lungs every 6 (six) hours as needed for wheezing or shortness of breath.     amLODipine 10 MG tablet  Commonly known as:  NORVASC  Take 1 tablet (10 mg total) by mouth daily.     aspirin EC 81 MG tablet  Take 1 tablet (81 mg total) by mouth daily.     atenolol 50 MG tablet  Commonly known as:  TENORMIN  Take 1 tablet (50 mg total) by mouth daily.     hydrochlorothiazide 25 MG tablet  Commonly known as:  HYDRODIURIL  Take 1 tablet (25 mg total) by mouth daily.     ibuprofen 200 MG tablet  Commonly known as:  ADVIL,MOTRIN  Take 200 mg by mouth every 6 (six) hours as needed for moderate pain.     metFORMIN 500 MG tablet  Commonly known as:  GLUCOPHAGE  Take 1 tablet (500 mg total) by mouth 2 (two) times daily with a meal.     pravastatin 40 MG tablet  Commonly known as:  PRAVACHOL  Take 1 tablet (40 mg total) by mouth daily.     spironolactone 25 MG tablet  Commonly known as:  ALDACTONE  Take 1 tablet (25 mg total) by mouth daily.     zoster vaccine live (PF) 19400 UNT/0.65ML injection  Commonly known as:  ZOSTAVAX  Inject 19,400 Units into the skin once.        Disposition and follow-up:   Jamie Mason was discharged from [facility] in Good condition.  At the hospital follow up visit please address:  1.  ACEI-Induced Angioedema: Please assess continued resolution of symptoms and make sure patient is not taking lisinopril.  HTN: Please assess BP control now off of lisinopril. Medications may need to be increased.   2.  Labs / imaging needed at time of follow-up: BMET  3.  Pending labs/ test needing follow-up: Tryptase, C4 complement level.   Follow-up Appointments: Follow-up Information    Follow up with Jamie Ly, MD On 08/26/2014.   Specialty:  Internal Medicine   Why:  10:15 am   Contact information:   [address] [place] [address] [phone]       Discharge Instructions: Discharge Instructions    Diet - low sodium heart healthy    Complete by:  As directed      Increase activity slowly    Complete by:  As directed            Consultations:  None  Admission HPI: Jamie Mason is a 74 year old female with hypertension, type 2 diabetes, hyperlipidemia, coronary artery disease complicated by a history of NSTEMI, lumbar disc disease who presented to  the ED with tongue swelling.  Yesterday around 1:00 PM, she reports the onset of tongue swelling, trouble breathing, drooling. She reports that she had similar symptoms 2-3 years ago and was given some pills by an ENT doctor with resolution of symptoms within the next 24 hours. She also reports having similar symptoms 3 months ago but that they resolved independently without treatment and thus did not think much of it. This appears to be around the time when she was started on HCTZ/lisinopril 12.5/10 mg on 10 7 by her then PCP, Dr. Johna Roles. Otherwise, she denies hives, eye/lip swelling, nausea, vomiting, abdominal pain, recent illness, fever, any new food exposure, recent use of penicillins [to which she has an allergy], a recent exposure to the outdoors. He has no known family history of similar symptoms. In the ED, she was given methylprednisolone  125 mg IV, Benadryl 50 mg IV, Pepcid 20 mg IV.  Hospital Course by problem list: Principal Problem:   Angioedema Active Problems:   Hypertension   Hyperlipidemia   CAD (coronary artery disease)   Type 2 diabetes mellitus with stage 2 chronic kidney disease   Normocytic anemia   ACEI-Induced Angioedema: Patient presented with tongue edema which caused difficulty swallowing. Edema improved overnight and throughout the following day. Likely secondary to lisinopril. Patient was on lisinopril 20 mg daily at home which was started back in September of 2015. Patient was given Solumedrol 125 mg IV once, pepcid 20 mg IV and benadryl 50 mg IV once overnight. WBC elevated at 12.9 possibly secondary to steroids, but increased neutrophils without eosinophils.The day following admission, patient's tongue greatly reduced in size and she was able to swallow without difficulty. She never had an symptoms of shortness of breath, wheezing, stridor, rash, or hives. Please assess continued resolution of symptoms and please make sure patient is not taking lisinopril. Please consider following up on previously ordered labs of tryptase and C4 complement.   Acute on Chronic Kidney Disease Stage II: Creatinine 1.2 up from 1.0 on 07/09/14 with baseline of 1.0. Magnesium and phosphorus normal. Please consider rechecking BMET.  Hypertension: BP was elevated during admission due to being npo and holding oral medications. BP was managed with lopressor IV during admission, but home medications of amlodipine, aldactone and HCTZ were resumed on discharge. Please assess BP control off of the lisinopril. Patient may need medications adjusted.   Type 2 Diabetes: A1c 6.4 on 07/09/14. Home meds include metformin 500 mg twice a day. Patient was managed on SSI-S while admitted, but metformin was resumed on discharge.   CAD with h/o STEMI: Patient is on ASA and pravastatin at home. Medications were held while she was NPO, but resumed on  discharge.  Lumbar disc disease: Patient is on ibuprofen at home, held during admission but resumed on discharge.  Discharge Vitals:   BP 137/106 mmHg  Pulse 115  Temp(Src) 97.9 F (36.6 C) (Oral)  Resp 19  Ht  (1.651 m)  Wt 320 lb 15.8 oz (145.6 kg)  BMI 53.42 kg/m2  SpO2 98%  Discharge Labs:  Results for orders placed or performed during the hospital encounter of 08/18/14 (from the past 24 hour(s))  MRSA PCR Screening     Status: None   Collection Time: 08/18/14 11:26 PM  Result Value Ref Range   MRSA by PCR NEGATIVE NEGATIVE  Comprehensive metabolic panel     Status: Abnormal   Collection Time: 08/19/14 12:05 AM  Result Value Ref Range   Sodium 137 135 -  145 mmol/L   Potassium 4.7 3.5 - 5.1 mmol/L   Chloride 100 96 - 112 mmol/L   CO2 25 19 - 32 mmol/L   Glucose, Bld 191 (H) 70 - 99 mg/dL   BUN 20 6 - 23 mg/dL   Creatinine, Ser 1.611.23 (H) 0.50 - 1.10 mg/dL   Calcium 9.7 8.4 - 09.610.5 mg/dL   Total Protein 9.1 (H) 6.0 - 8.3 g/dL   Albumin 3.8 3.5 - 5.2 g/dL   AST 33 0 - 37 U/L   ALT 19 0 - 35 U/L   Alkaline Phosphatase 100 39 - 117 U/L   Total Bilirubin 1.3 (H) 0.3 - 1.2 mg/dL   GFR calc non Af Amer 43 (L) >90 mL/min   GFR calc Af Amer 50 (L) >90 mL/min   Anion gap 12 5 - 15  Magnesium     Status: None   Collection Time: 08/19/14 12:05 AM  Result Value Ref Range   Magnesium 1.8 1.5 - 2.5 mg/dL  Phosphorus     Status: None   Collection Time: 08/19/14 12:05 AM  Result Value Ref Range   Phosphorus 3.5 2.3 - 4.6 mg/dL  CBC with Differential/Platelet     Status: Abnormal   Collection Time: 08/19/14 12:05 AM  Result Value Ref Range   WBC 12.9 (H) 4.0 - 10.5 K/uL   RBC 4.74 3.87 - 5.11 MIL/uL   Hemoglobin 13.9 12.0 - 15.0 g/dL   HCT 04.541.2 40.936.0 - 81.146.0 %   MCV 86.9 78.0 - 100.0 fL   MCH 29.3 26.0 - 34.0 pg   MCHC 33.7 30.0 - 36.0 g/dL   RDW 91.414.9 78.211.5 - 95.615.5 %   Platelets 379 150 - 400 K/uL   Neutrophils Relative % 88 (H) 43 - 77 %   Neutro Abs 11.2 (H) 1.7 - 7.7  K/uL   Lymphocytes Relative 12 12 - 46 %   Lymphs Abs 1.6 0.7 - 4.0 K/uL   Monocytes Relative 0 (L) 3 - 12 %   Monocytes Absolute 0.0 (L) 0.1 - 1.0 K/uL   Eosinophils Relative 0 0 - 5 %   Eosinophils Absolute 0.0 0.0 - 0.7 K/uL   Basophils Relative 0 0 - 1 %   Basophils Absolute 0.0 0.0 - 0.1 K/uL  HIV antibody     Status: None   Collection Time: 08/19/14 12:05 AM  Result Value Ref Range   HIV Screen 4th Generation wRfx Non Reactive Non Reactive  Glucose, capillary     Status: Abnormal   Collection Time: 08/19/14  9:22 AM  Result Value Ref Range   Glucose-Capillary 176 (H) 70 - 99 mg/dL   Comment 1 Notify RN   Glucose, capillary     Status: Abnormal   Collection Time: 08/19/14 12:16 PM  Result Value Ref Range   Glucose-Capillary 156 (H) 70 - 99 mg/dL   Comment 1 Notify RN     Signed: Jill AlexandersAlexa Richardson, DO PGY-1 Internal Medicine Resident Pager # [phone] 08/19/2014 3:08 PM

## 2014-08-19 NOTE — Discharge Instructions (Signed)
Thank you for allowing us to be involved in your healthcare while you were hospitalized at [facility].   Please note that there have been changes to your home medications.  --> PLEASE LOOK AT YOUR DISCHARGE MEDICATION LIST FOR DETAILS.  Please call your PCP if you have any questions or concerns, or any difficulty getting any of your medications.  Please return to the ER if you have worsening of your symptoms or new severe symptoms arise.  STOP TAKING LISINOPRIL.  RESUME YOUR OTHER MEDICATIONS AS PRESCRIBED.  PLEASE FOLLOW UP IN THE CLINIC ON 08/26/14 AT 10:15 AM.   Angioedema Angioedema is a sudden swelling of tissues, often of the skin. It can occur on the face or genitals or in the abdomen or other body parts. The swelling usually develops over a short period and gets better in 24 to 48 hours. It often begins during the night and is found when the person wakes up. The person may also get red, itchy patches of skin (hives). Angioedema can be dangerous if it involves swelling of the air passages.  Depending on the cause, episodes of angioedema may only happen once, come back in unpredictable patterns, or repeat for several years and then gradually fade away.  CAUSES  Angioedema can be caused by an allergic reaction to various triggers. It can also result from nonallergic causes, including reactions to drugs, immune system disorders, viral infections, or an abnormal gene that is passed to you from your parents (hereditary). For some people with angioedema, the cause is unknown.  Some things that can trigger angioedema include:   Foods.   Medicines, such as ACE inhibitors, ARBs, nonsteroidal anti-inflammatory agents, or estrogen.   Latex.   Animal saliva.   Insect stings.   Dyes used in X-rays.   Mild injury.   Dental work.  Surgery.  Stress.   Sudden changes in temperature.   Exercise. SIGNS AND SYMPTOMS   Swelling of the skin.  Hives. If  these are present, there is also intense itching.  Redness in the affected area.   Pain in the affected area.  Swollen lips or tongue.  Breathing problems. This may happen if the air passages swell.  Wheezing. If internal organs are involved, there may be:   Nausea.   Abdominal pain.   Vomiting.   Difficulty swallowing.   Difficulty passing urine. DIAGNOSIS   Your health care provider will examine the affected area and take a medical and family history.  Various tests may be done to help determine the cause. Tests may include:  Allergy skin tests to see if the problem is an allergic reaction.   Blood tests to check for hereditary angioedema.   Tests to check for underlying diseases that could cause the condition.   A review of your medicines, including over-the-counter medicines, may be done. TREATMENT  Treatment will depend on the cause of the angioedema. Possible treatments include:   Removal of anything that triggered the condition (such as stopping certain medicines).   Medicines to treat symptoms or prevent attacks. Medicines given may include:   Antihistamines.   Epinephrine injection.   Steroids.   Hospitalization may be required for severe attacks. If the air passages are affected, it can be an emergency. Tubes may need to be placed to keep the airway open. HOME CARE INSTRUCTIONS   Take all medicines as directed by your health care provider.  If you were given medicines for emergency allergy treatment, always carry them  with you.  Wear a medical bracelet as directed by your health care provider.   Avoid known triggers. SEEK MEDICAL CARE IF:   You have repeat attacks of angioedema.   Your attacks are more frequent or more severe despite preventive measures.   You have hereditary angioedema and are considering having children. It is important to discuss with your health care provider the risks of passing the condition on to your  children. SEEK IMMEDIATE MEDICAL CARE IF:   You have severe swelling of the mouth, tongue, or lips.  You have difficulty breathing.   You have difficulty swallowing.   You faint. MAKE SURE YOU:  Understand these instructions.  Will watch your condition.  Will get help right away if you are not doing well or get worse. Document Released: 08/08/2001 Document Revised: 10/14/2013 Document Reviewed: 01/21/2013 [facility] Patient Information 2015 [place], [place]. This information is not intended to replace advice given to you by your health care provider. Make sure you discuss any questions you have with your health care provider.

## 2014-08-19 NOTE — Progress Notes (Signed)
Discharged home by wheelchair, accompanied by sister, discharge instructions given to pt. Belongings taken home.

## 2014-08-19 NOTE — Progress Notes (Signed)
UR completed 

## 2014-08-20 LAB — C4 COMPLEMENT: Complement C4, Body Fluid: 47 mg/dL — ABNORMAL HIGH (ref 14–44)

## 2014-08-22 LAB — TRYPTASE: TRYPTASE: 5.2 ug/L (ref <11)

## 2014-08-26 ENCOUNTER — Ambulatory Visit: Payer: Medicare HMO | Admitting: Internal Medicine

## 2014-08-28 ENCOUNTER — Ambulatory Visit (INDEPENDENT_AMBULATORY_CARE_PROVIDER_SITE_OTHER): Payer: Commercial Managed Care - HMO | Admitting: Internal Medicine

## 2014-08-28 ENCOUNTER — Encounter: Payer: Self-pay | Admitting: Licensed Clinical Social Worker

## 2014-08-28 ENCOUNTER — Encounter: Payer: Self-pay | Admitting: Internal Medicine

## 2014-08-28 VITALS — BP 168/62 | HR 74 | Temp 98.1°F | Wt 326.2 lb

## 2014-08-28 DIAGNOSIS — N182 Chronic kidney disease, stage 2 (mild): Secondary | ICD-10-CM

## 2014-08-28 DIAGNOSIS — T783XXD Angioneurotic edema, subsequent encounter: Secondary | ICD-10-CM | POA: Diagnosis not present

## 2014-08-28 DIAGNOSIS — Z0000 Encounter for general adult medical examination without abnormal findings: Secondary | ICD-10-CM

## 2014-08-28 DIAGNOSIS — I10 Essential (primary) hypertension: Secondary | ICD-10-CM

## 2014-08-28 DIAGNOSIS — E1129 Type 2 diabetes mellitus with other diabetic kidney complication: Secondary | ICD-10-CM | POA: Diagnosis not present

## 2014-08-28 DIAGNOSIS — E785 Hyperlipidemia, unspecified: Secondary | ICD-10-CM

## 2014-08-28 DIAGNOSIS — E1122 Type 2 diabetes mellitus with diabetic chronic kidney disease: Secondary | ICD-10-CM | POA: Diagnosis not present

## 2014-08-28 DIAGNOSIS — M519 Unspecified thoracic, thoracolumbar and lumbosacral intervertebral disc disorder: Secondary | ICD-10-CM

## 2014-08-28 DIAGNOSIS — R0609 Other forms of dyspnea: Secondary | ICD-10-CM

## 2014-08-28 DIAGNOSIS — D649 Anemia, unspecified: Secondary | ICD-10-CM | POA: Diagnosis not present

## 2014-08-28 DIAGNOSIS — I25118 Atherosclerotic heart disease of native coronary artery with other forms of angina pectoris: Secondary | ICD-10-CM | POA: Diagnosis not present

## 2014-08-28 DIAGNOSIS — Z23 Encounter for immunization: Secondary | ICD-10-CM | POA: Diagnosis not present

## 2014-08-28 NOTE — Assessment & Plan Note (Addendum)
Wt Readings from Last 3 Encounters:  08/28/14 326 lb 3.2 oz (147.963 kg)  08/18/14 320 lb 15.8 oz (145.6 kg)  07/09/14 320 lb (145.151 kg)   She has gained a lot of weight recently bc eating too much of the wrong things and not moving. Her clothes are all too small. She likes fried foods, snack foods, sodas, bread. I explained the dangers of weight. I offered Lupita LeashDonna referral and not interested. We discussed exercise and I suggested water aerobics or gym and taking friend. Her husband is blind so doen't think he would go. We discussed food and I encouraged her to make one change to her diet and gave examples such as changing to diet soda, decreasing the # sodas per day, changing how often she eat fried food, changing the quantity of fried food.   Thi occupied the majority (15 min) of the 30 min visit.

## 2014-08-28 NOTE — Assessment & Plan Note (Signed)
Only had one low HgB - all others nl. Seems to have been fluke. Anemia panel nl. Remove from PL.

## 2014-08-28 NOTE — Addendum Note (Signed)
Addended by: Blanch MediaBUTCHER, Vallery Mcdade A on: 08/28/2014 03:01 PM   Modules accepted: Level of Service

## 2014-08-28 NOTE — Addendum Note (Signed)
Addended by: Blanch MediaBUTCHER, Demika Langenderfer A on: 08/28/2014 02:28 PM   Modules accepted: Level of Service

## 2014-08-28 NOTE — Assessment & Plan Note (Signed)
Does get DOE when walks distance. Had PFT's recently that were not too abnl. Likely 2/2 weight.

## 2014-08-28 NOTE — Assessment & Plan Note (Signed)
BP Readings from Last 3 Encounters:  08/28/14 168/62  08/19/14 137/106  07/09/14 146/53   Has not taken meds today. Supposed to be on Norvasc 10, atenolol 50 QD, HCTZ 25 QD, and spironolactone 25 QD. Start with calling pharmacy to ensure getting meds. [facility].

## 2014-08-28 NOTE — Assessment & Plan Note (Signed)
Resolved. Tongue nl

## 2014-08-28 NOTE — Assessment & Plan Note (Signed)
On prava 40. LDL 10/15 128 so not at goal. Not clear if she is taking meds so will start with calling pharmacy.

## 2014-08-28 NOTE — Progress Notes (Signed)
   Subjective:    Patient ID: Jamie Mason, female    DOB: 06/17/1940, 74 y.o.   MRN: [id number]  HPI  Please see the A&P for the status of the pt's chronic medical problems. Pt does not know her meds and didn't bring bottle so unable to complete med rec.  Soc hx updated in appropriate section.  Review of Systems  Constitutional: Positive for unexpected weight change.  HENT: Negative for trouble swallowing.        No tongue enlargement  Respiratory: Positive for shortness of breath.   Cardiovascular: Positive for chest pain and leg swelling.  Musculoskeletal: Positive for back pain, arthralgias and gait problem.  Psychiatric/Behavioral: Negative for sleep disturbance.       Objective:   Physical Exam  Constitutional: She is oriented to person, place, and time. She appears well-developed and well-nourished. No distress.  HENT:  Head: Normocephalic and atraumatic.  Right Ear: External ear normal.  Left Ear: External ear normal.  Nose: Nose normal.  Tongue nl  Eyes: Conjunctivae and EOM are normal.  Cardiovascular: Normal rate, regular rhythm and normal heart sounds.  Exam reveals no friction rub.   No murmur heard. Pulmonary/Chest: Effort normal and breath sounds normal.  Abdominal: Bowel sounds are normal.  Musculoskeletal: Normal range of motion. She exhibits edema. She exhibits no tenderness.  Trace B edema  Neurological: She is alert and oriented to person, place, and time.  Skin: Skin is warm and dry. She is not diaphoretic.  Psychiatric: She has a normal mood and affect. Her behavior is normal. Judgment and thought content normal.          Assessment & Plan:

## 2014-08-28 NOTE — Patient Instructions (Signed)
1. Find an exercise that you like to do. [facility] or going to gym via your insurance. 2. Find one change to make to your diet. 3. You got the Tdap today 4. Think about the colon cancer screening, mammogram, and bone density test 5. Think about working with Jamie Mason for weight loss 6. I am looking into a blood pressure cuff for home. THN MIGHT CALL YOU.

## 2014-08-28 NOTE — Assessment & Plan Note (Signed)
She is on BB, ASA, and statin. She cont to have some CP in throat area when she walks far which is not often. Has no future appt with cards. Really needs lifestyle mgmt.

## 2014-08-28 NOTE — Assessment & Plan Note (Signed)
Asked about back brace. Explained not too helpful except in certain situations and I would start with PT referral. She is not interested at this time.  Asked about surgery - again explained only for certain indications.   States hurts too much to move so she doesn't move too much. I explained loosing weight will help her pain quite a bit.

## 2014-08-28 NOTE — Assessment & Plan Note (Signed)
Lab Results  Component Value Date   HGBA1C 6.4 07/09/2014   She is supposed to be on Metformin 500  BID. A1C at goal. Microalb today. Other DM items up to date. No change today.

## 2014-08-28 NOTE — Progress Notes (Signed)
CSW briefly met with Jamie Mason following her scheduled [facility] appointment.  Pt's PCP would like pt to start monitoring her BP at home, however with limited funds.  Discussed referral to [facility] to inquire if [facility] can assist with obtaining home bp monitor for Jamie Mason.  Pt in agreement.  Referral made to [facility].

## 2014-08-28 NOTE — Assessment & Plan Note (Signed)
She is to think about colon, MMG, Dexa.  She got Tdap today.

## 2014-08-29 LAB — MICROALBUMIN / CREATININE URINE RATIO
Creatinine, Urine: 192.4 mg/dL
MICROALB UR: 10 mg/dL — AB (ref <2.0)
Microalb Creat Ratio: 52 mg/g — ABNORMAL HIGH (ref 0.0–30.0)

## 2014-09-04 NOTE — Progress Notes (Signed)
Pt assigned to Michele Mcalpineheryl Hutchinson RN, [facility] care manager.

## 2014-09-08 ENCOUNTER — Other Ambulatory Visit: Payer: Self-pay

## 2014-09-08 NOTE — Patient Outreach (Signed)
[facility] Care Management  09/08/2014  [name] 06/22/1940 [id number]    [facility] Coord[facility] Screening Call Referral from Primary MD:  Dr. Blanch MediaElizabeth Butcher  Issues:  HTN  Patient admits to HTN and is unable to provide any readings from past MD visit.  Patient unable to state MD BP goal. Patient has no BP cuff in the home.  Patient admits to excessive weight gain currently at 326 and states h/o past weight 175.  States she was walking more during her low weight history but is no longer able to walk but a short distance before getting short of breath.    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  Adult daughter provides transportation to MD appt's as needed.  Patient is still driving but states her car is not working and dependent on daughter for all transportation services.   Ambulates with no assistive devices and denies any falls over the past year.  Hospital Admissions:  1>12 months ER Visits:  1>12 months DME:  Scales for daily weights.    Barriers to Care: Patient has lack of knowledge regarding disease management of Diabetes and HTN management. Medication Adherence:  Patient forgets to take her medications.  Patient does not have home BP cuff to self-monitor her BP Patient does not self check her blood sugar readings.  Glucometer in the home belongs to husband.  Patient does monitor her husbands blood sugar readings. Patient states MD has never ordered her to self monitor her own blood sugar readings.    Medication Reconciliation: RN CM completed this call:  Primary MD consulted regarding clarification needed on Atenolol Patient reported she does not have a Rx bottle for atenolol 50 MG tablet; Take 1 tablet (50 mg total) by mouth daily. (Ordered on 08/19/2014 hospital discharge) RN CM requested call back from MD office with clarification so that RN CM may educate patient on finding.   Patient  agrees [facility] and referral [facility] Management services.   RN CM instructed patient that Community CM will contact patient within 10 business days to schedule home visits.  [facility] RN CM [facility].    Donato Schultzrystal Nathan Moctezuma, RN, BSN, [facility], CCM  [facility] Care Management Care Management Coordinator [phone] Office [phone] Direct [phone] Cell

## 2014-09-09 ENCOUNTER — Other Ambulatory Visit: Payer: Self-pay

## 2014-09-09 NOTE — Patient Outreach (Signed)
[facility] Care Management  09/09/2014  Jamie Mason 04/23/1941 [id number]   Telephone outreach to Primary MD, Dr. Blanch MediaElizabeth Butcher [phone] ([phone]) Issue:  Medication follow-up:  Atenolol   Dr. Rogelia BogaButcher confirms seeing patient for the first time in March.  Medications were reviewed and patient should be taking atenolol 50mg  everyday.  Patient was provided a copy of her AVS which included Atenolol.  MD advised a year's supply was sent into patient's pharmacy ([facility]) in Oct 2015. [facility] needs [facility] to get [facility]ill.  MD instructs to also remind patient that she needs to schedule an appt with Dr. Rogelia BogaButcher in June - she apparently left without scheduling her next appt.     [facility] [address] location [phone].   RN CM confirmed last fill date was in January 2016 for a 90 day supply.    RN CM attempted outreach call to patient (unsuccessful); and left name and contact # for call back.  RN CM attempted 2nd outreach and was able to leave message with contact person in the home.   RN CM will update Community nurse assigned to this case of medication clarification and appt to see Dr. Rogelia BogaButcher needs to be scheduled for June.  Outcome:  Pending    Donato Schultzrystal Shayan Bramhall, RN, BSN, [facility], CCM  [facility] Care Management Care Management Coordinator [phone] Office [phone] Direct [phone] Cell

## 2014-09-10 ENCOUNTER — Other Ambulatory Visit: Payer: Self-pay

## 2014-09-10 ENCOUNTER — Telehealth: Payer: Self-pay

## 2014-09-10 NOTE — Patient Outreach (Signed)
     [facility]N) Care Management  09/10/2014  [name] 05/07/1941 [id number]  Outbound telephonic outreach call.  Issue:  Medication update on Atenolol following MD consult  RN CM instructed patient primary MD, Dr. Rogelia BogaButcher was consulted and confirmed patient should be taking Atenolol 50mg  tablet by mouth daily.  RN CM advised patient that [facility] [place] [facility] filled the Rx January 2016 for 90 day supply.   Patient states she must have misplaced the bottle somewhere in the house.  RN CM reviewed importance of taking medication as prescribed in order to manage blood pressure appropriately and avoid unnecessary ED visits or hospital admissions.  Instructed patient this is not a new medication and patient should have been taking this medication.   RN CM discussed goals of improving patients quality of health outcomes by improving BP management. RN CM advised refill is due and RN CM will notify pharmacy of request to prepare refill.    Patient agreed with plan and confirmed she will pick up prescription today and start medication.    Donato Schultzrystal Patric Vanpelt, RN, BSN, [facility], CCM  [facility] Care Management Care Management Coordinator [phone] Office [phone] Direct [phone] Cell

## 2014-09-10 NOTE — Patient Outreach (Signed)
    [facility]) Care Management  09/10/2014  [name] 03/15/1941 [id number]   RN CM contacted [id number]- [place], [place] - [address]  [phone] and requested refill on Atenolol be prepared for patient to pick up today.  RN CM requested to call patient when ready for pick up.   Donato Schultzrystal Azul Brumett, RN, BSN, [facility], CCM  [facility] Care Management Care Management Coordinator [phone] Office [phone] Direct [phone] Cell

## 2014-09-13 ENCOUNTER — Other Ambulatory Visit: Payer: Self-pay | Admitting: Internal Medicine

## 2014-09-15 ENCOUNTER — Other Ambulatory Visit: Payer: Self-pay | Admitting: *Deleted

## 2014-09-15 NOTE — Patient Outreach (Signed)
Referral received from telephonic care manager, Jamie Mason.  Member has history of hypertension, diabetes, and coronary artery disease and has been referred community care management for further evaluation and assistance.    Member called, identity confirmed.  This care manager introduced self and purpose of call.  Member acknowledges the conversation with Jamie Mason and to have this community care manager make home visit.  Initial home visit scheduled for next week.  Member denies any urgent concerns at this time.  Member provided with this care manager's contact information.  Encouraged to call with any questions or concerns.  Jamie DurieMonica Marshelle Mason, BSN, [facility] Care Management  [facility] Care Manager [phone]

## 2014-09-15 NOTE — Telephone Encounter (Signed)
Filled 3/8 with refills, she needs to call her pharmacy

## 2014-09-22 ENCOUNTER — Ambulatory Visit: Payer: Medicare HMO | Admitting: Podiatry

## 2014-09-24 ENCOUNTER — Encounter: Payer: Self-pay | Admitting: *Deleted

## 2014-09-24 ENCOUNTER — Other Ambulatory Visit: Payer: Self-pay | Admitting: *Deleted

## 2014-09-24 VITALS — BP 138/72 | HR 64 | Resp 18 | Ht 65.0 in | Wt 326.0 lb

## 2014-09-24 DIAGNOSIS — E118 Type 2 diabetes mellitus with unspecified complications: Secondary | ICD-10-CM

## 2014-09-25 NOTE — Patient Outreach (Signed)
[facility]) Care Management   09/24/2014  Jamie Mason November 16, 1940 [id number]  Jamie Mason is an 74 y.o. female  Subjective:   Member reports that she has been feeling well, denies any major concerns at this time.  Objective:   Review of Systems  Musculoskeletal: Positive for back pain.       Reports taking Ibuprofen with relief, have not taken any today    Physical Exam  Constitutional: She is oriented to person, place, and time. She appears well-developed and well-nourished.  HENT:  Head: Normocephalic.  Neck: Normal range of motion.  Cardiovascular: Normal rate, regular rhythm and normal heart sounds.   Respiratory: Effort normal and breath sounds normal.  GI: Soft. Bowel sounds are normal.  Musculoskeletal: Normal range of motion.  Neurological: She is alert and oriented to person, place, and time.  Skin: Skin is warm and dry.  Psychiatric: She has a normal mood and affect. Her behavior is normal.   Blood pressure 138/72, pulse 64, resp. rate 18, height 1.651 m ($Remove'5\' 5"'wSQOYmj$ ), weight 326 lb (147.873 kg), SpO2 98 %.  Current Medications:   Current Outpatient Prescriptions  Medication Sig Dispense Refill  . albuterol (PROVENTIL HFA;VENTOLIN HFA) 108 (90 BASE) MCG/ACT inhaler Inhale 2 puffs into the lungs every 6 (six) hours as needed for wheezing or shortness of breath. 1 Inhaler 3  . amLODipine (NORVASC) 10 MG tablet Take 1 tablet (10 mg total) by mouth daily. 90 tablet 3  . aspirin EC 81 MG tablet Take 1 tablet (81 mg total) by mouth daily. 90 tablet 3  . atenolol (TENORMIN) 50 MG tablet Take 1 tablet (50 mg total) by mouth daily. 90 tablet 3  . hydrochlorothiazide (HYDRODIURIL) 25 MG tablet Take 1 tablet (25 mg total) by mouth daily. 30 tablet 0  . ibuprofen (ADVIL,MOTRIN) 200 MG tablet Take 200 mg by mouth every 6 (six) hours as needed for moderate pain.    . metFORMIN (GLUCOPHAGE) 500 MG tablet Take 1 tablet (500 mg total) by mouth 2 (two) times daily  with a meal. 60 tablet 3  . pravastatin (PRAVACHOL) 40 MG tablet Take 1 tablet (40 mg total) by mouth daily. 90 tablet 3  . spironolactone (ALDACTONE) 25 MG tablet Take 1 tablet (25 mg total) by mouth daily. 30 tablet 3   No current facility-administered medications for this visit.    Functional Status:   In your present state of health, do you have any difficulty performing the following activities: 09/24/2014 09/08/2014  Hearing? Y N  Vision? Y -  Difficulty concentrating or making decisions? [name]  Walking or climbing stairs? Y Y  Dressing or bathing? N N  Doing errands, shopping? [name]  Preparing Food and eating ? N N  Using the Toilet? N N  In the past six months, have you accidently leaked urine? Y -  Do you have problems with loss of bowel control? N -  Managing your Medications? Y Y  Managing your Finances? N -  Housekeeping or managing your Housekeeping? N -    Fall/Depression Screening:    PHQ 2/9 Scores 09/24/2014 08/28/2014 05/30/2014 05/02/2014 04/04/2014 03/19/2014  PHQ - 2 Score 0 0 0 0 0 0    Assessment:    Met with member at home for initial home visit.  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  She states that she also has a daughter that  lives with her who helps out as much as she can.  [facility] Care management services explained, consent obtained, information packet provided.  Member has documented history of diabetes, however does not monitor any blood sugars.  She states that she was told the her diabetes was not an issue since her A1C was below 7.  She also states that she does not check her blood pressure although her blood pressure is usually elevated.  Discussed importance of monitoring blood pressure, verbalizes understanding and states that she will have her daughter obtain a blood pressure machine.    Member states that her biggest concern is her weight gain.  She states that this has begun to effect  her mobility, and that she can't walk long distances before getting tired and short of breath.  She states that she does not know exactly how much weight she has gained, nor does she know the time period in which it was gained.  She does voice interest in losing weight.  She states that she does not weight herself on a regular basis because she does not want to see the number on the scale.  Ways to become active discussed, including starting with walking and building tolerance up.  Member also mentioned that he diet consists of fried foods and snacks that are high in sodium.  Healthy alternative diet discussed, including healthy snacks such as fruits.  Member states that she will attempt to monitor her diet better over the next few weeks and become more active.    Contact information for this care manager provided, encouraged to voice any questions or concerns.  24 hour nurse line also discussed.  Plan:   Will call primary care office to obtain target blood pressure range and to inquire about diabetes status and need for glucose meter. Order placed for social worker involvement. Will send EMMI education regarding weight loss and diet. Routine home visit scheduled for next month.  [place]        Patient Outreach from 09/24/2014 in [place] Problem One  hypertension   Care Plan for Problem One  Active   Interventions for Problem One Long Term Goal  Discussed the importance of monitoring blood pressure    THN Long Term Goal (31-90 days)  Member will begin taking and recording blood pressure readings daily within the next 31-45 days   THN Long Term Goal Start Date  09/24/14   THN CM Short Term Goal #1 (0-30 days)  Member will report obtaining blood pressure machine within the next 2 weeks   THN CM Short Term Goal #1 Start Date  09/24/14   [facility]N CM Short Term Goal #2 (0-30 days)  Will be able to verbalize personal target goal for blood pressure   THN CM Short  Term Goal #2 Start Date  09/24/14   Care Plan Problem Two  Weight gain   Care Plan for Problem Two  Active   Interventions for Problem Two Long Term Goal   Discussed ways to decrease weight, including exercise and diet   THN Long Term Goal (31-90) days  Member will report weight loss over the next 90 days   THN Long Term Goal Start Date  09/24/14   THN CM Short Term Goal #1 (0-30 days)  Member will weigh self at least once a week for the next 4 weeks to monitor weight gain/loss   THN CM Short Term Goal #1 Start Date  09/24/14  Valente David, BSN, [place] Management  [facility] Care Manager [phone]

## 2014-10-09 ENCOUNTER — Ambulatory Visit: Payer: Medicare HMO | Admitting: Internal Medicine

## 2014-10-10 ENCOUNTER — Other Ambulatory Visit: Payer: Self-pay | Admitting: *Deleted

## 2014-10-10 ENCOUNTER — Telehealth: Payer: Self-pay | Admitting: *Deleted

## 2014-10-10 NOTE — Telephone Encounter (Signed)
[name], thn calls and has 2 questions 1) should pt be testing her blood sugars? 2) would like to know what pt's target BP range is, ms lane states she is working with pt to check BP daily and keep a record of the readings Please advise [name] [phone]

## 2014-10-10 NOTE — Telephone Encounter (Signed)
CBG - only as needed. She is well controlled on only metformin so does not need to check QD. I am even OK if she doesn't check.   BP - goal for now <140/90. She has not yet met this goal but I am limited by med increases bc she freq doesn't take meds prior to MD appt so I do not know "real" BP.

## 2014-10-10 NOTE — Telephone Encounter (Signed)
Called and left answers on confidential vmail

## 2014-10-10 NOTE — Patient Outreach (Signed)
Call placed to member's primary care office to inquire about the member's diagnosis of diabetes and the need to have monitoring equipment at home for her blood sugars.  Also wanted to inquire about the member's history of hypertension and a target blood pressure for the member.  Detailed message left on the nurse line.  Will await for a call back and will place call to member with updated information.  Kemper DurieMonica Audrie Kuri, BSN, [facility] Care Management  [facility] Care Manager [phone]

## 2014-10-13 DIAGNOSIS — M545 Low back pain: Secondary | ICD-10-CM | POA: Diagnosis not present

## 2014-10-13 DIAGNOSIS — E119 Type 2 diabetes mellitus without complications: Secondary | ICD-10-CM | POA: Diagnosis not present

## 2014-10-13 DIAGNOSIS — E785 Hyperlipidemia, unspecified: Secondary | ICD-10-CM | POA: Diagnosis not present

## 2014-10-13 DIAGNOSIS — I10 Essential (primary) hypertension: Secondary | ICD-10-CM | POA: Diagnosis not present

## 2014-10-13 DIAGNOSIS — Z6841 Body Mass Index (BMI) 40.0 and over, adult: Secondary | ICD-10-CM | POA: Diagnosis not present

## 2014-10-15 ENCOUNTER — Encounter: Payer: Self-pay | Admitting: *Deleted

## 2014-10-22 ENCOUNTER — Other Ambulatory Visit: Payer: Self-pay | Admitting: *Deleted

## 2014-10-22 ENCOUNTER — Encounter: Payer: Self-pay | Admitting: *Deleted

## 2014-10-22 NOTE — Patient Outreach (Signed)
[facility]) Care Management   10/22/2014  Jamie Mason [birth year] [id number]  Jamie Mason is an 74 y.o. female  Subjective:   "I'm doing all right."  Member denies any pain or discomfort at this time.  Objective:   Review of Systems  Constitutional: Negative.   HENT: Negative.   Eyes: Negative.   Respiratory: Negative.   Cardiovascular: Positive for leg swelling.  Gastrointestinal: Negative.   Genitourinary: Negative.   Musculoskeletal: Negative.   Skin: Negative.   Neurological: Negative.   Endo/Heme/Allergies: Negative.   Psychiatric/Behavioral: Negative.     Physical Exam  Constitutional: She is oriented to person, place, and time. She appears well-developed and well-nourished.  Neck: Normal range of motion.  Cardiovascular: Normal rate, regular rhythm and normal heart sounds.   Respiratory: Effort normal and breath sounds normal.  GI: Soft. Bowel sounds are normal.  Musculoskeletal: Normal range of motion.  Neurological: She is alert and oriented to person, place, and time.  Skin: Skin is warm and dry.  Psychiatric: She has a normal mood and affect. Her behavior is normal.   BP 164/90 mmHg  Pulse 63  Resp 16  Ht 1.651 m ('5\' 5"' )  Wt 326 lb (147.873 kg)  BMI 54.25 kg/m2  SpO2 96%  Current Medications:   Current Outpatient Prescriptions  Medication Sig Dispense Refill  . albuterol (PROVENTIL HFA;VENTOLIN HFA) 108 (90 BASE) MCG/ACT inhaler Inhale 2 puffs into the lungs every 6 (six) hours as needed for wheezing or shortness of breath. 1 Inhaler 3  . amLODipine (NORVASC) 10 MG tablet Take 1 tablet (10 mg total) by mouth daily. 90 tablet 3  . aspirin EC 81 MG tablet Take 1 tablet (81 mg total) by mouth daily. 90 tablet 3  . atenolol (TENORMIN) 50 MG tablet Take 1 tablet (50 mg total) by mouth daily. 90 tablet 3  . hydrochlorothiazide (HYDRODIURIL) 25 MG tablet Take 1 tablet (25 mg total) by mouth daily. 30 tablet 0  . ibuprofen  (ADVIL,MOTRIN) 200 MG tablet Take 200 mg by mouth every 6 (six) hours as needed for moderate pain.    . metFORMIN (GLUCOPHAGE) 500 MG tablet Take 1 tablet (500 mg total) by mouth 2 (two) times daily with a meal. 60 tablet 3  . pravastatin (PRAVACHOL) 40 MG tablet Take 1 tablet (40 mg total) by mouth daily. 90 tablet 3  . spironolactone (ALDACTONE) 25 MG tablet Take 1 tablet (25 mg total) by mouth daily. 30 tablet 3   No current facility-administered medications for this visit.    Functional Status:   In your present state of health, do you have any difficulty performing the following activities: 10/22/2014 09/24/2014  Hearing? - Y  Vision? Y Y  Difficulty concentrating or making decisions? [name]  Walking or climbing stairs? N Y  Dressing or bathing? N N  Doing errands, shopping? [name]  Preparing Food and eating ? N N  Using the Toilet? N N  In the past six months, have you accidently leaked urine? Y Y  Do you have problems with loss of bowel control? N N  Managing your Medications? Y Y  Managing your Finances? N N  Housekeeping or managing your Housekeeping? N N    Fall/Depression Screening:    PHQ 2/9 Scores 10/22/2014 09/24/2014 08/28/2014 05/30/2014 05/02/2014 04/04/2014 03/19/2014  PHQ - 2 Score 0 0 0 0 0 0 0    Assessment:    Met with member at home at scheduled time.  Husband present during visit.  Member reports that she has not been able to obtain a blood pressure machine (states that she forgot to ask her daughter to get her one) but will have one within the next week or so.  Member made aware of Dr. Zenovia Jarred target blood pressure for member of 140/90, and member made aware of the importance of taking blood pressure medications prior to office visits.  Informed that this helps the physician make decisions regarding dosages of blood pressure medications.  Member also made aware that the physician feels that her diabetes is under control and does not recommend her to check her blood  sugar on a regular basis.    Member continues to voice concerns about weight and the desire to lose weight.  She states that she is a member of the [employer] and is aware that she can get involved with exercise programs at the [facility].  She states that she will look into getting involved with the programs that are offered.  Member also provided with EMMI information regarding diet and exercise for patients with diabetes.  Carbohydrate counting discussed along with the decreasing of fried foods.  Member states that she does not like baked foods, but will make an attempt to eat healthier, more fruits and vegetables.  Member also provided with EMMI education regarding hypertension.  Member states that she has not heard from anyone regarding assistance in getting her rail fixed on her porch.  Member informed that the social worker will be contacted regarding community resources.    Member denies any further concerns at this time.  Encouraged to contact this care manager with any questions.  [facility] calendar provided and member instructed to document weights and blood pressure readings in the appropriate sections.  Plan:   Will contact care manager assistant regarding social worker involvement for community resources. Routine home visit scheduled for next month.  [facility] CM Care Plan Problem One        Patient Outreach from 10/22/2014 in [place] Problem One  hypertension   Care Plan for Problem One  Active   THN Long Term Goal (31-90 days)  Member will begin taking and recording blood pressure readings daily within the next 31-45 days   THN Long Term Goal Start Date  09/24/14   Interventions for Problem One Long Term Goal  Discussed the importance of monitoring blood pressure    THN CM Short Term Goal #1 (0-30 days)  Member will report obtaining blood pressure machine within the next 2 weeks   THN CM Short Term Goal #1 Start Date  10/22/14 [original goal date not met,  start date updated]   Interventions for Short Term Goal #1  Discussed ability to physically and financially obtain blood pressure machine.  Reports that her daughter works at [employer] and will be able to get one   [facility] CM Short Term Goal #2 (0-30 days)  Will be able to verbalize personal target goal for blood pressure   [facility]N CM Short Term Goal #2 Start Date  09/24/14   [facility] CM Short Term Goal #2 Met Date  10/22/14   Interventions for Short Term Goal #2  Will discuss with physician the target for member's blood pressure    THN CM Care Plan Problem Two        Patient Outreach from 10/22/2014 in [place] Problem Two  Weight gain   Care Plan for Problem  Two  Active   Interventions for Problem Two Long Term Goal   Discussed ways to decrease weight, including exercise and diet   THN Long Term Goal (31-90) days  Member will report weight loss over the next 90 days   THN Long Term Goal Start Date  09/24/14   [facility]N CM Short Term Goal #1 (0-30 days)  Member will weigh self at least once a week for the next 4 weeks to monitor weight gain/loss   THN CM Short Term Goal #1 Start Date  10/22/14 [Original goal date not met, start date updated]   Interventions for Short Term Goal #2   Discussed importance of monitoring weight to verify progress   THN CM Short Term Goal #2 (0-30 days)  Member will begin using [employer] program to become active within the next 4 weeks, working out at least twice a week   [facility] CM Short Term Goal #2 Start Date  10/22/14   Interventions for Short Term Goal #2  Discussed the importance of becoming active and working out in reference to weight loss      Valente David, BSN, [place] Manager [phone]

## 2014-10-23 ENCOUNTER — Other Ambulatory Visit: Payer: Self-pay | Admitting: *Deleted

## 2014-10-23 NOTE — Patient Outreach (Signed)
[facility]) Care Management  10/23/2014  Jamie NighGloria C Mason 12/12/1940 [id number]  Phone call to patient to assess for social work needs.  Per patient, she needs assistance with completing an Advanced Directive.  Patient also looking for assistance with getting her outside rails repaired.  Per patient, her husband is blind and needs the railing to walk down the front steps.  Home visit scheduled for 10/28/14 to address above needs.   Adriana ReamsChrystal Land, LCSW [facility] Care Management [phone]

## 2014-10-27 ENCOUNTER — Ambulatory Visit: Payer: Medicare HMO | Admitting: Podiatry

## 2014-10-28 ENCOUNTER — Other Ambulatory Visit: Payer: Self-pay | Admitting: *Deleted

## 2014-10-28 NOTE — Patient Outreach (Addendum)
[facility]) Care Management  [facility] Social Work  10/28/2014  Jamie Mason 1940-07-17 [id number]  Subjective:    Objective:   Current Medications:  Current Outpatient Prescriptions  Medication Sig Dispense Refill  . albuterol (PROVENTIL HFA;VENTOLIN HFA) 108 (90 BASE) MCG/ACT inhaler Inhale 2 puffs into the lungs every 6 (six) hours as needed for wheezing or shortness of breath. 1 Inhaler 3  . amLODipine (NORVASC) 10 MG tablet Take 1 tablet (10 mg total) by mouth daily. 90 tablet 3  . aspirin EC 81 MG tablet Take 1 tablet (81 mg total) by mouth daily. 90 tablet 3  . atenolol (TENORMIN) 50 MG tablet Take 1 tablet (50 mg total) by mouth daily. 90 tablet 3  . hydrochlorothiazide (HYDRODIURIL) 25 MG tablet Take 1 tablet (25 mg total) by mouth daily. 30 tablet 0  . ibuprofen (ADVIL,MOTRIN) 200 MG tablet Take 200 mg by mouth every 6 (six) hours as needed for moderate pain.    . metFORMIN (GLUCOPHAGE) 500 MG tablet Take 1 tablet (500 mg total) by mouth 2 (two) times daily with a meal. 60 tablet 3  . pravastatin (PRAVACHOL) 40 MG tablet Take 1 tablet (40 mg total) by mouth daily. 90 tablet 3  . spironolactone (ALDACTONE) 25 MG tablet Take 1 tablet (25 mg total) by mouth daily. 30 tablet 3   No current facility-administered medications for this visit.    Functional Status:  In your present state of health, do you have any difficulty performing the following activities: 10/22/2014 09/24/2014  Hearing? - Y  Vision? Y Y  Difficulty concentrating or making decisions? Jamie Mason  Walking or climbing stairs? N Y  Dressing or bathing? N N  Doing errands, shopping? Jamie Mason  Preparing Food and eating ? N N  Using the Toilet? N N  In the past six months, have you accidently leaked urine? Y Y  Do you have problems with loss of bowel control? N N  Managing your Medications? Y Y  Managing your Finances? N N  Housekeeping or managing your Housekeeping? N N    Fall/Depression Screening:   PHQ 2/9 Scores 10/22/2014 09/24/2014 08/28/2014 05/30/2014 05/02/2014 04/04/2014 03/19/2014  PHQ - 2 Score 0 0 0 0 0 0 0    Assessment: This Education officer, museum met with patient and her spouse in the home to assist with providing resources for needed home modifications and completing her Advanced Directive.  Per patient, her husband lost his eyesight approximatley 2 years ago and she is afraid that he will not be able to walk down the stairs of their front porch withouout fallling due to the absence of the rails. Patient's spouse has SCAT and has been a previous client with [facility], however services have been discontinued.   Referral made to  [employer] while in the home to possibly assist with needed railing.    Advanced Directive discussed, importance of completion emphasized.   Addiitonal copies given to patient and her spouse to complete.  Per patient, she will have her daughter look over the document before it is notarized.  Patient identifies her daugher as her main caregiver, stating that she assist her with running errands and going to the doctor.  Patient interested in appointing her daughter [name].  Phone number provided to [employer] [place] for possible assistance   Plan: This Education officer, museum to follow up with referrals made in one month.   [facility] CM Care Plan Problem  One        Patient Outreach from 10/22/2014 in [place] Problem One  hypertension   Care Plan for Problem One  Active   [facility]N Long Term Goal (31-90 days)  Member will begin taking and recording blood pressure readings daily within the next 31-45 days   [facility]N Long Term Goal Start Date  09/24/14   Interventions for Problem One Long Term Goal  Discussed the importance of monitoring blood pressure    [facility] CM Short Term Goal #1 (0-30 days)  Member will report obtaining blood pressure machine within the next 2 weeks   [facility]N CM Short Term Goal #1 Start Date   10/22/14 [original goal date not met, start date updated]   Interventions for Short Term Goal #1  Discussed ability to physically and financially obtain blood pressure machine.  Reports that her daughter works at [employer] and will be able to get one   [facility] CM Short Term Goal #2 (0-30 days)  Will be able to verbalize personal target goal for blood pressure   [facility]N CM Short Term Goal #2 Start Date  09/24/14   [facility] CM Short Term Goal #2 Met Date  10/22/14   Interventions for Short Term Goal #2  Will discuss with physician the target for member's blood pressure    THN CM Care Plan Problem Two        Patient Outreach from 10/22/2014 in [place] Problem Two  Weight gain   Care Plan for Problem Two  Active   Interventions for Problem Two Long Term Goal   Discussed ways to decrease weight, including exercise and diet   THN Long Term Goal (31-90) days  Member will report weight loss over the next 90 days   THN Long Term Goal Start Date  09/24/14   [facility] CM Short Term Goal #1 (0-30 days)  Member will weigh self at least once a week for the next 4 weeks to monitor weight gain/loss   [facility]N CM Short Term Goal #1 Start Date  10/22/14 [Original goal date not met, start date updated]   Interventions for Short Term Goal #2   Discussed importance of monitoring weight to verify progress   [facility]N CM Short Term Goal #2 (0-30 days)  Member will begin using Silver Sneakers program to become active within the next 4 weeks, working out at least twice a week   [facility]N CM Short Term Goal #2 Start Date  10/22/14   Interventions for Short Term Goal #2  Discussed the importance of becoming active and working out in reference to weight loss    [facility] CM Care Plan Problem Three        Patient Outreach from 10/28/2014 in [place] Problem Three  patient needs homemodifications to reduce falls   Care Plan for Problem Three  Active   THN Long Term Goal (31-90) days  patient to have community  resource in place to repair rails on her front porch stairs within 90 dyas   [place] Term Goal Start Date  10/28/14   Interventions for Problem Three Long Term Goal  referral made to community housing solutions for assistance with rail repair.     Note routed to patient's provider's office.    Sheralyn Boatman [facility] Care Management [phone]

## 2014-10-31 ENCOUNTER — Other Ambulatory Visit: Payer: Self-pay | Admitting: Internal Medicine

## 2014-10-31 NOTE — Telephone Encounter (Signed)
Pls sch appt with me - June or call back for July

## 2014-11-13 ENCOUNTER — Encounter: Payer: Self-pay | Admitting: *Deleted

## 2014-11-20 ENCOUNTER — Other Ambulatory Visit: Payer: Self-pay | Admitting: *Deleted

## 2014-11-20 NOTE — Patient Outreach (Signed)
[facility]) Care Management  11/20/2014  Jamie Mason 01-Nov-1940 [id number]   Phone call to patient to follow up on referral made to [employer] to assist with installing rails on her porch.  Per patient, she did receive a call from them and they have sent her the intake paperwork to complete. Per patient, she has not completed the paper work yet.  Patient states that she has not completed her Advanced Directive, however does have the paperwork and will plan to complete that on her own as well.  Per patient, she declined help completing the Advanced Directive.  Patient agreed to have paper work from [employer] completed within 2 weeks.  CSW to follow up with patient in 2 weeks regarding completion of intake paperwork.   [name] [facility] Care Management [phone]

## 2014-11-26 ENCOUNTER — Other Ambulatory Visit: Payer: Self-pay | Admitting: *Deleted

## 2014-11-26 NOTE — Patient Instructions (Signed)

## 2014-11-26 NOTE — Patient Outreach (Signed)
[facility]) Care Management   11/26/2014  Jamie Mason 01-13-1941 [id number]  Jamie Mason is an 74 y.o. female  Subjective:   "I guess I'm doing all right."  Member denies any pain or discomfort at this time.  Objective:   Review of Systems  Constitutional: Negative.   HENT: Negative.   Eyes: Negative.   Respiratory: Negative.   Cardiovascular: Negative.   Gastrointestinal: Negative.   Genitourinary: Negative.   Musculoskeletal: Negative.   Skin: Negative.   Neurological: Negative.   Endo/Heme/Allergies: Negative.   Psychiatric/Behavioral: Negative.     Physical Exam  Constitutional: She is oriented to person, place, and time. She appears well-developed and well-nourished.  Neck: Normal range of motion.  Cardiovascular: Normal rate, regular rhythm and normal heart sounds.   Respiratory: Effort normal and breath sounds normal.  GI: Soft. Bowel sounds are normal.  Musculoskeletal: Normal range of motion.  Neurological: She is alert and oriented to person, place, and time.  Skin: Skin is warm and dry.   BP 174/92 mmHg  Pulse 64  Resp 16  Ht 1.651 m (_0 )  Wt 326 lb (147.873 kg)  BMI 54.25 kg/m2  SpO2 98%  Current Medications:   Current Outpatient Prescriptions  Medication Sig Dispense Refill  . albuterol (PROVENTIL HFA;VENTOLIN HFA) 108 (90 BASE) MCG/ACT inhaler Inhale 2 puffs into the lungs every 6 (six) hours as needed for wheezing or shortness of breath. 1 Inhaler 3  . amLODipine (NORVASC) 10 MG tablet Take 1 tablet (10 mg total) by mouth daily. 90 tablet 3  . aspirin EC 81 MG tablet Take 1 tablet (81 mg total) by mouth daily. 90 tablet 3  . atenolol (TENORMIN) 50 MG tablet Take 1 tablet (50 mg total) by mouth daily. 90 tablet 3  . hydrochlorothiazide (HYDRODIURIL) 25 MG tablet TAKE 1 TABLET (25 MG TOTAL) BY MOUTH DAILY. 30 tablet 5  . ibuprofen (ADVIL,MOTRIN) 200 MG tablet Take 200 mg by mouth every 6 (six) hours as needed for moderate  pain.    . metFORMIN (GLUCOPHAGE) 500 MG tablet Take 1 tablet (500 mg total) by mouth 2 (two) times daily with a meal. 60 tablet 3  . pravastatin (PRAVACHOL) 40 MG tablet Take 1 tablet (40 mg total) by mouth daily. 90 tablet 3  . spironolactone (ALDACTONE) 25 MG tablet Take 1 tablet (25 mg total) by mouth daily. 30 tablet 3   No current facility-administered medications for this visit.    Functional Status:   In your present state of health, do you have any difficulty performing the following activities: 11/26/2014 10/22/2014  Hearing? N -  Vision? Y Y  Difficulty concentrating or making decisions? Jamie Mason  Walking or climbing stairs? N N  Dressing or bathing? N N  Doing errands, shopping? Jamie Mason  Preparing Food and eating ? N N  Using the Toilet? N N  In the past six months, have you accidently leaked urine? Y Y  Do you have problems with loss of bowel control? N N  Managing your Medications? Y Y  Managing your Finances? N N  Housekeeping or managing your Housekeeping? N N    Fall/Depression Screening:    PHQ 2/9 Scores 11/26/2014 10/22/2014 09/24/2014 08/28/2014 05/30/2014 05/02/2014 04/04/2014  PHQ - 2 Score 0 0 0 0 0 0 0    Assessment:    Met with member at scheduled time.  Husband present during visit.  Member states that she has been doing fine.  She  reports that she has not worked on any of her goals that were discussed during our last visit.  She reports that she keeps forgetting to ask her daughter, who works at [employer], to purchase a blood pressure machine for her so that she can start recording daily blood pressure readings.  She states that she plans to order one when she orders her husband some medical supplies, however, she is not sure if she can afford to do so.  Member advised that this care manager will discuss the possibility of a machine being provided for her with the social worker, C. Land.  Member also states that she has not been adhering to a diabetic or low sodium diet.   She has been prescribed this diet for diabetes control and for history of hypertension.  She has also stated that she would like to lose weight.  Member states that she does not like baked foods.  The opportunity for grilled foods discussed, member states that she also does not like any grilled foods.  She states that her and her husband both eat nothing but fried foods.  Discussed the importance of decreasing fried foods in her diet.  Member verbalizes understanding but states that she is not sure if she can adhere.  Member states that she has not started attending classes with silver sneakers due to the difficulty to walk from the car to the location of the class.  She reports that she does not have a handicapped placard nor does she have a handicapped license plate.  Discussed with member the steps to take to obtain one (obtain paperwork from physician office and take to the Department of Motor Vehicles).  Member strongly encouraged to take advantage of senior resources.  Discussed with member the importance of monitoring weights during weight loss journey.  Member states that she has not started weighing herself because she is disappointed that she has not been able to lose any weight yet.  Reassurance given, encouraged to begin weights today, however, current scale only goes up to 260 pounds.  Will discuss with social worker the possibility of providing member with scale.    Member reports that she has not been able to review EMMI diabetes education provided to include diabetes education and carbohydrate counts.  She states that she does still have the paperwork.  Encouraged to review, member verbalizes understanding.    Member denies any further questions at this time.  Encouraged to contact this care manager to voice concerns.  Plan:   Will contact social worker to discuss financial assessment and the possibility of providing member with scale and blood pressure machine. Routine home visit scheduled  for next month.  [facility] CM Care Plan Problem One        Patient Outreach from 11/26/2014 in [place] Problem One  hypertension   Care Plan for Problem One  Active   THN Long Term Goal (31-90 days)  Member's blood pressure will be within provided range (from primary physician) within the next 31-45 days   [facility]N Long Term Goal Start Date  11/26/14 [Previous long term goal changed to short term goal]   Interventions for Problem One Long Term Goal  Discussed the importance of monitoring blood pressure    [facility]N CM Short Term Goal #1 (0-30 days)  Member will report obtaining blood pressure machine within the next 2 weeks   [facility]N CM Short Term Goal #1 Start Date  11/26/14 [Goal not met, start  date reset]   Interventions for Short Term Goal #1  Discussed ability to physically and financially obtain blood pressure machine.  Reports that her daughter works at [employer] and will be able to get one. Discussed with social worker the possibility of providing one for member   [facility]N CM Short Term Goal #2 (0-30 days)  Will be able to verbalize personal target goal for blood pressure   [facility]N CM Short Term Goal #2 Start Date  09/24/14   [facility] CM Short Term Goal #2 Met Date  10/22/14   Interventions for Short Term Goal #2  Will discuss with physician the target for member's blood pressure   [facility]N CM Short Term Goal #3 (0-30 days)  Member will monitor and record blood pressure on a daily basis within the next 4 weeks   [facility]N CM Short Term Goal #3 Start Date  11/26/14   Interventions for Short Tern Goal #3  Discussed the importance of monitoring blood pressure     [facility]N CM Care Plan Problem Two        Patient Outreach from 11/26/2014 in [place] Problem Two  Weight gain   Care Plan for Problem Two  Active   Interventions for Problem Two Long Term Goal   Discussed ways to decrease weight, including exercise and diet   THN Long Term Goal (31-90) days  Member will report weight loss  over the next 90 days   THN Long Term Goal Start Date  09/24/14   [facility]N CM Short Term Goal #1 (0-30 days)  Member will weigh self at least once a week for the next 4 weeks to monitor weight gain/loss   [facility]N CM Short Term Goal #1 Start Date  11/26/14 [Goal not met, start date updated]   Interventions for Short Term Goal #2   Discussed importance of monitoring weight to verify progress   [facility]N CM Short Term Goal #2 (0-30 days)  Member will begin using Silver Sneakers program to become active within the next 4 weeks, working out at least twice a week   [facility] CM Short Term Goal #2 Start Date  11/26/14 [Goal not met, start date reset]   Interventions for Short Term Goal #2  Discussed the importance of becoming active and working out in reference to weight loss   [facility]N CM Short Term Goal #3 (0-30 days)  Member will obtain an accurate scale within the next 2 weeks   [facility]N CM Short Term Goal #3 Start Date  11/26/14   Interventions for Short Term Goal #3  Discussed with social worker the possibility of providing a scale, current one only goes up to 260 pounds, member is over 300 pounds    [facility] CM Care Plan Problem Three        Patient Outreach Telephone from 11/20/2014 in [place]   Interventions for Problem Three Long Term Goal  intake paperwork completed for [employer] housing [employer], patient urged to complete and return paperwork within 2 weeks      Valente David, BSN, [place] Manager [phone]

## 2014-11-26 NOTE — Patient Outreach (Signed)
[facility]) Care Management  11/26/2014  Jamie Mason Jun 22, 1940 [id number]  Phone call from [facility] confirming that intake paperwork was mailed out to patient's home to install railing on her porch.  Intake paperwork has not been returned.  CSW to encourage patient to complete eligibility paperwork   [name] [facility] Care Management [phone]

## 2014-12-02 ENCOUNTER — Other Ambulatory Visit: Payer: Self-pay | Admitting: *Deleted

## 2014-12-02 NOTE — Patient Outreach (Signed)
[facility]) Care Management  12/02/2014  Jamie Mason 04-24-1941 [id number]   Phone call to patient to follow up referral made to [employer].  Per patient, she received the eligibility packet and is working on gathering the documents required to completed the application.  Per RNCM, patient also needing a scale and blood pressure cuff. Per patient, her finances are limited and she has very little left over for extras.  This social worker will completed financial assessment during the next visit( 12/09/14)  to assess for community resources to assist with cost of a scale and blood pressure cuff.   [name] [facility] Care Management [phone]

## 2014-12-04 ENCOUNTER — Encounter: Payer: Self-pay | Admitting: *Deleted

## 2014-12-04 NOTE — Patient Outreach (Signed)
[facility]) Care Management  12/04/2014  Jamie Mason 08/19/1940 [id number]   EMMI assigned to patient in Advanced Directives for further explanation.    [name] [facility] Care Management [phone]

## 2014-12-09 ENCOUNTER — Ambulatory Visit: Payer: Commercial Managed Care - HMO | Admitting: *Deleted

## 2014-12-10 ENCOUNTER — Encounter: Payer: Self-pay | Admitting: Internal Medicine

## 2014-12-11 ENCOUNTER — Encounter: Payer: Commercial Managed Care - HMO | Admitting: Pharmacist

## 2014-12-11 ENCOUNTER — Ambulatory Visit (INDEPENDENT_AMBULATORY_CARE_PROVIDER_SITE_OTHER): Payer: Commercial Managed Care - HMO | Admitting: Internal Medicine

## 2014-12-11 ENCOUNTER — Encounter: Payer: Self-pay | Admitting: Internal Medicine

## 2014-12-11 VITALS — BP 170/80 | HR 64 | Temp 98.3°F | Ht 65.0 in | Wt 327.9 lb

## 2014-12-11 DIAGNOSIS — E1122 Type 2 diabetes mellitus with diabetic chronic kidney disease: Secondary | ICD-10-CM

## 2014-12-11 DIAGNOSIS — Z6841 Body Mass Index (BMI) 40.0 and over, adult: Secondary | ICD-10-CM

## 2014-12-11 DIAGNOSIS — M519 Unspecified thoracic, thoracolumbar and lumbosacral intervertebral disc disorder: Secondary | ICD-10-CM

## 2014-12-11 DIAGNOSIS — N182 Chronic kidney disease, stage 2 (mild): Secondary | ICD-10-CM | POA: Diagnosis not present

## 2014-12-11 DIAGNOSIS — I25118 Atherosclerotic heart disease of native coronary artery with other forms of angina pectoris: Secondary | ICD-10-CM

## 2014-12-11 DIAGNOSIS — Z0000 Encounter for general adult medical examination without abnormal findings: Secondary | ICD-10-CM

## 2014-12-11 DIAGNOSIS — R0609 Other forms of dyspnea: Secondary | ICD-10-CM

## 2014-12-11 DIAGNOSIS — I129 Hypertensive chronic kidney disease with stage 1 through stage 4 chronic kidney disease, or unspecified chronic kidney disease: Secondary | ICD-10-CM | POA: Diagnosis not present

## 2014-12-11 DIAGNOSIS — I2510 Atherosclerotic heart disease of native coronary artery without angina pectoris: Secondary | ICD-10-CM

## 2014-12-11 DIAGNOSIS — E785 Hyperlipidemia, unspecified: Secondary | ICD-10-CM

## 2014-12-11 DIAGNOSIS — I10 Essential (primary) hypertension: Secondary | ICD-10-CM

## 2014-12-11 DIAGNOSIS — M5136 Other intervertebral disc degeneration, lumbar region: Secondary | ICD-10-CM

## 2014-12-11 MED ORDER — VERAPAMIL HCL ER 240 MG PO TBCR
240.0000 mg | EXTENDED_RELEASE_TABLET | Freq: Every day | ORAL | Status: DC
Start: 2014-12-11 — End: 2015-04-16

## 2014-12-11 MED ORDER — ROSUVASTATIN CALCIUM 20 MG PO TABS: 20.0000 mg | ORAL_TABLET | Freq: Every day | ORAL | Status: DC

## 2014-12-11 NOTE — Assessment & Plan Note (Signed)
Lab Results  Component Value Date   HGBA1C 6.4 07/09/2014   HGBA1C 6.6* 03/19/2014     Assessment: Diabetes control: good control (HgbA1C at goal) Progress toward A1C goal:  at goal Comments: See below  Plan: Medications:  continue current medications Home glucose monitoring: Frequency: no home glucose monitoring Timing: N/A Instruction/counseling given: discussed the need for weight loss and discussed diet Educational resources provided:   Self management tools provided: other (see comments) (Doesn't need) Other plans: See below  She is on metformin 500 BID and had bottle today. I meant to get an A1C but forgot today - will get in 3 month F/U. She has no meter at home and is not interested in having one bc has to manage her husband's DM. I am OK with no monitoring bc A1C so good.   See A&P for obesity, HTN, and HLD for further info.

## 2014-12-11 NOTE — Assessment & Plan Note (Signed)
[facility] - colon screening, MMG (hurts), etc.

## 2014-12-11 NOTE — Assessment & Plan Note (Addendum)
She was surprised to hear she had heart dz. We reviewed her cath report and she wanted to know what she could do to help it. Pls see each RF A&P.   Cont ASA, statin, BB.

## 2014-12-11 NOTE — Assessment & Plan Note (Signed)
BP Readings from Last 3 Encounters:  12/11/14 170/80  11/26/14 174/92  10/22/14 164/90    Lab Results  Component Value Date   NA 137 08/19/2014   K 4.7 08/19/2014   CREATININE 1.23* 08/19/2014    Assessment: Blood pressure control: severely elevated Progress toward BP goal:  unchanged Comments: See below  Plan: Medications:  See below Educational resources provided:   Self management tools provided: other (see comments) ([facility]) Other plans: See below   Poorly controlled. But [facility] was 138/72 so there may be component of white coat HTN. ACE is CI 2/2 angioedema and I am hesitant to start ARB bc angioedema was bad enough to warrant hospitalization. Norvasc is providing no proteinuria benefit so will stop it (pt wants to finish pills she paid for, about another 2 week) and then change to verapamil SR 240 and titrate up as needed. Will ask [facility] if they can check BP over this transition.

## 2014-12-11 NOTE — Assessment & Plan Note (Signed)
Wt Readings from Last 3 Encounters:  12/11/14 327 lb 14.4 oz (148.734 kg)  11/26/14 326 lb (147.873 kg)  10/22/14 326 lb (147.873 kg)   Weight is basically stable. She states she doesn;t understand why she is so big bc she doesn't eat much. Last appt, she admitted to liking and eating junk foods - processed foods, sweets, etc. I asked for food recall yesterday and she stated she ate oatmeal and that was it. I pressed on and she remembered she ate junk food. I pressed for details and she said cheetos. I asked if whole bag and she said not the whole bag. I asked if any other junk food and she said no. Later she remembered she had a doughnut or two. I do not think she is appropriately appreciates the quantity or impact of the junk food she is eating. Combined with her inactivity 2/2 back pain, will make weight loss difficult. Encouraged her to go to Y (trouble walking from lot to entrance) or home sitting exercises. Also encouraged her to make one or two diet changes. Refused Lupita LeashDonna last appt - ask again next appt.

## 2014-12-11 NOTE — Assessment & Plan Note (Signed)
Denies DOE today. Only able to walk to front door and back 2/2 back pain then has to sit down.

## 2014-12-11 NOTE — Assessment & Plan Note (Signed)
She does not receive any opioids. Focus on weight loss.

## 2014-12-11 NOTE — Progress Notes (Signed)
   Subjective:    Patient ID: Jamie Mason, female    DOB: 11/03/1940, 74 y.o.   MRN: [id number]  HPI  Jamie NighGloria C Tiggs is here for HTN F/U. Please see the A&P for the status of the pt's chronic medical problems.  Review of Systems  Constitutional: Negative for unexpected weight change.  HENT: Negative for rhinorrhea.   Eyes: Negative for itching.  Respiratory: Negative for shortness of breath.   Cardiovascular: Negative for chest pain and leg swelling.  Gastrointestinal: Negative for blood in stool.  Musculoskeletal: Positive for back pain and gait problem.  Skin: Negative for wound.  Neurological: Negative for headaches.  Psychiatric/Behavioral: Negative for sleep disturbance.  + snoring but no apnea (per pt's report of what her husband said)     Objective:   Physical Exam  Constitutional: She appears well-developed and well-nourished. No distress.  HENT:  Head: Normocephalic and atraumatic.  Right Ear: External ear normal.  Left Ear: External ear normal.  Nose: Nose normal.  Eyes: Conjunctivae and EOM are normal.  Cardiovascular: Normal rate, regular rhythm and normal heart sounds.   Pulmonary/Chest: Effort normal.  Abdominal: Bowel sounds are normal.  Musculoskeletal: Normal range of motion.  Indent where elastic from pants pressed into skin  Neurological: She is alert.  Skin: Skin is warm and dry. She is not diaphoretic.  Psychiatric: She has a normal mood and affect. Her behavior is normal. Judgment and thought content normal.          Assessment & Plan:

## 2014-12-11 NOTE — Patient Instructions (Addendum)
1. Your diabetes is great! 2. Find a way to move and exercise 3. For your cholesterol   Finish the pravastatin. Then start the new cholesterol medicine - crestor 4. For your blood pressure  Finish the Amlodipine. Then start the new blood pressure medicine - verapamil

## 2014-12-11 NOTE — Assessment & Plan Note (Signed)
I calc 10 yr risk at 6338 - 55%. She is on prava 40 and LDL 128 Oct 2015. She is agreeable to changing to high potency statin but wants to finish the prava she paid for (about 2 motnhs worth). Then will start Crestor 20 and F/U FLP at next appt.

## 2014-12-12 ENCOUNTER — Other Ambulatory Visit: Payer: Self-pay | Admitting: *Deleted

## 2014-12-12 NOTE — Patient Outreach (Signed)
[facility]) Care Management  [facility] Social Work  12/12/2014  Jamie Mason 1941/03/15 [id number]  Subjective:    Objective:   Current Medications:  Current Outpatient Prescriptions  Medication Sig Dispense Refill  . albuterol (PROVENTIL HFA;VENTOLIN HFA) 108 (90 BASE) MCG/ACT inhaler Inhale 2 puffs into the lungs every 6 (six) hours as needed for wheezing or shortness of breath. 1 Inhaler 3  . amLODipine (NORVASC) 10 MG tablet Take 1 tablet (10 mg total) by mouth daily. 90 tablet 3  . aspirin EC 81 MG tablet Take 1 tablet (81 mg total) by mouth daily. 90 tablet 3  . atenolol (TENORMIN) 50 MG tablet Take 1 tablet (50 mg total) by mouth daily. 90 tablet 3  . hydrochlorothiazide (HYDRODIURIL) 25 MG tablet TAKE 1 TABLET (25 MG TOTAL) BY MOUTH DAILY. 30 tablet 5  . ibuprofen (ADVIL,MOTRIN) 200 MG tablet Take 200 mg by mouth every 6 (six) hours as needed for moderate pain.    . metFORMIN (GLUCOPHAGE) 500 MG tablet Take 1 tablet (500 mg total) by mouth 2 (two) times daily with a meal. 60 tablet 3  . pravastatin (PRAVACHOL) 40 MG tablet Take 1 tablet (40 mg total) by mouth daily. 90 tablet 3  . rosuvastatin (CRESTOR) 20 MG tablet Take 1 tablet (20 mg total) by mouth at bedtime. 90 tablet 3  . spironolactone (ALDACTONE) 25 MG tablet Take 1 tablet (25 mg total) by mouth daily. 30 tablet 3  . verapamil (CALAN-SR) 240 MG CR tablet Take 1 tablet (240 mg total) by mouth daily. 90 tablet 3   No current facility-administered medications for this visit.    Functional Status:  In your present state of health, do you have any difficulty performing the following activities: 12/11/2014 11/26/2014  Hearing? N N  Vision? N Y  Difficulty concentrating or making decisions? Malvin Johns  Walking or climbing stairs? Y N  Dressing or bathing? N N  Doing errands, shopping? Y Y  Quarry manager and eating ? - N  Using the Toilet? - N  In the past six months, have you accidently leaked urine? - Y  Do  you have problems with loss of bowel control? - N  Managing your Medications? - Y  Managing your Finances? - N  Housekeeping or managing your Housekeeping? - N    Fall/Depression Screening:  PHQ 2/9 Scores 12/11/2014 11/26/2014 10/22/2014 09/24/2014 08/28/2014 05/30/2014 05/02/2014  PHQ - 2 Score 0 0 0 0 0 0 0    Assessment: Home visit to patient's home to follow up on referral for [employer] and to complete financial assessment to assess eligibility for a scale and blood pressure cuff.  Per patient, she has not completed paperwork stating that she does not thinks she would not meet the eligibility requirements.  Patient encouraged to complete the paperwork to be sure.  Financial assessment completed.  Blood pressure cuff and scale provided to patient.  RNCM to assist patient with how to use both on her next visit.  Per patient, she had a doctor's appointment yesterday and forgot to ask her doctor to complete the handicapped placard request form.  This CSW made several attempts to call the doctor's office to request completion of the form, however experienced long hold times and did not get through.  Per patient, her brother in law put up a rail to help patient and spouse navigate the stairs which has helped.  Patient's husband is able to use that railing to get down  the stairs.  Plan:  This CSW will follow up with patient in regards to referral to [employer] in 1 month.  CSW will also contact patient's doctor's office regarding patient's handicapped placard.    Adriana ReamsChrystal Land, LCSW [facility] Care Management [phone]

## 2014-12-25 ENCOUNTER — Encounter: Payer: Self-pay | Admitting: *Deleted

## 2014-12-31 ENCOUNTER — Ambulatory Visit: Payer: Commercial Managed Care - HMO | Admitting: *Deleted

## 2014-12-31 ENCOUNTER — Other Ambulatory Visit: Payer: Self-pay | Admitting: *Deleted

## 2014-12-31 NOTE — Patient Outreach (Signed)
Call received from member requesting to change home visit from today to next Wednesday.  Member informed that this visit would include the demonstration of how to use both her new scale and blood pressure machine.  Member verbalizes understanding and states that next week would work better for her.  She denies any urgent needs or concerns today.  Visit rescheduled for next week.  Member encouraged to contact this care manager with any concerns.  Kemper DurieMonica Merrell Rettinger, BSN, [facility] Care Management  [facility] [phone]

## 2015-01-07 ENCOUNTER — Other Ambulatory Visit: Payer: Self-pay | Admitting: *Deleted

## 2015-01-07 VITALS — BP 188/84 | HR 91 | Resp 20 | Wt 327.0 lb

## 2015-01-07 DIAGNOSIS — I10 Essential (primary) hypertension: Secondary | ICD-10-CM

## 2015-01-07 NOTE — Patient Outreach (Addendum)
[facility]) Care Management   01/07/2015  Jamie Mason [birth year] [id number]  Jamie Mason is an 74 y.o. female  Subjective:   Objective:   Review of Systems  Constitutional: Negative.   HENT: Negative.   Eyes: Negative.   Respiratory: Negative.   Cardiovascular: Negative.   Gastrointestinal: Negative.   Musculoskeletal: Negative.   Skin: Negative.   Neurological: Negative.   Endo/Heme/Allergies: Negative.   Psychiatric/Behavioral: Negative.     Physical Exam  Constitutional: She is oriented to person, place, and time. She appears well-developed and well-nourished.  Neck: Normal range of motion.  Cardiovascular: Normal rate, regular rhythm and normal heart sounds.   Respiratory: Effort normal and breath sounds normal.  GI: Soft. Bowel sounds are normal.  Musculoskeletal: Normal range of motion.  Neurological: She is alert and oriented to person, place, and time.  Skin: Skin is warm and dry.   BP 188/84 mmHg  Pulse 91  Resp 20  Wt 327 lb (148.326 kg)  SpO2 96%  Current Medications:   Current Outpatient Prescriptions  Medication Sig Dispense Refill  . albuterol (PROVENTIL HFA;VENTOLIN HFA) 108 (90 BASE) MCG/ACT inhaler Inhale 2 puffs into the lungs every 6 (six) hours as needed for wheezing or shortness of breath. 1 Inhaler 3  . amLODipine (NORVASC) 10 MG tablet Take 1 tablet (10 mg total) by mouth daily. 90 tablet 3  . aspirin EC 81 MG tablet Take 1 tablet (81 mg total) by mouth daily. 90 tablet 3  . atenolol (TENORMIN) 50 MG tablet Take 1 tablet (50 mg total) by mouth daily. 90 tablet 3  . hydrochlorothiazide (HYDRODIURIL) 25 MG tablet TAKE 1 TABLET (25 MG TOTAL) BY MOUTH DAILY. 30 tablet 5  . ibuprofen (ADVIL,MOTRIN) 200 MG tablet Take 200 mg by mouth every 6 (six) hours as needed for moderate pain.    . metFORMIN (GLUCOPHAGE) 500 MG tablet Take 1 tablet (500 mg total) by mouth 2 (two) times daily with a meal. 60 tablet 3  . pravastatin  (PRAVACHOL) 40 MG tablet Take 1 tablet (40 mg total) by mouth daily. 90 tablet 3  . spironolactone (ALDACTONE) 25 MG tablet Take 1 tablet (25 mg total) by mouth daily. 30 tablet 3  . rosuvastatin (CRESTOR) 20 MG tablet Take 1 tablet (20 mg total) by mouth at bedtime. (Patient not taking: Reported on 01/07/2015) 90 tablet 3  . verapamil (CALAN-SR) 240 MG CR tablet Take 1 tablet (240 mg total) by mouth daily. (Patient not taking: Reported on 01/07/2015) 90 tablet 3   No current facility-administered medications for this visit.    Functional Status:   In your present state of health, do you have any difficulty performing the following activities: 12/11/2014 11/26/2014  Hearing? N N  Vision? N Y  Difficulty concentrating or making decisions? [name]  Walking or climbing stairs? Y N  Dressing or bathing? N N  Doing errands, shopping? Y Y  Conservation officer, nature and eating ? - N  Using the Toilet? - N  In the past six months, have you accidently leaked urine? - Y  Do you have problems with loss of bowel control? - N  Managing your Medications? - Y  Managing your Finances? - N  Housekeeping or managing your Housekeeping? - N    Fall/Depression Screening:    PHQ 2/9 Scores 12/11/2014 11/26/2014 10/22/2014 09/24/2014 08/28/2014 05/30/2014 05/02/2014  PHQ - 2 Score 0 0 0 0 0 0 0    Assessment:    Met  with member at scheduled time.  Member states that she is doing "fine" but still has not increased her activity level nor has she changed her diet.  She reports that she was seen by Dr. Lynnae January and that she has prescribed new medications for her blood pressure and cholesterol (verapamil and crestor).  Member states that she requested to complete her Norvasc and her pravachol (which will be discontinued) prior to starting the new medications due to the cost.  She has almost a full bottle of pravachol left and approximately 2 weeks of norvasc left.  She states that Dr. Lynnae January agreed.  She does report that she went to  the pharmacy to pick up the prescriptions and learned that one of the medications cost $140.  She states that she is unable to afford this mediation right now.  Member made aware that this care manager would contact the [facility] pharmacist to research other alternatives for payment.  She is also requesting information regarding mail order medications.   Member has received a blood pressure monitor and scale from [facility].  Member educated on proper use of blood pressure machine with return demonstration noted.  The member does have the scale, but can't find the box or instructions for it at this time.  The scale need to be calibrated and zeroed, which can't be done without proper instruction.  Member notified to contact this care manager when it is found in order to provide detailed instructions on use.  This care manager will also research about this particular scale and how to calibrate it.  Member encouraged to monitor blood pressure and weight daily (once scale is functioning) in order for better disease and medication management/collaboration with the physician.    Member reports that she has been trying to find ways that she can become more active in effort to lose weight and manager her hypertension better.  She states that she has not taken advantage of [employer] because she can't park at the front door and it is difficult for her to walk long distances.  This care manager inquired about the handicapped placard she requested assistance with.  She states that she forgot to obtain the paperwork from the physician.  This care manger encouraged her to request the paperwork to obtain the placard.  Member also states that she has seen an exercise machine on television that she would like to purchase.  Encouraged to take advantage of cost effective ways to become active, but the main idea is to become active.  Member denies any further concerns at this time, encouraged to contact this care manager with any  questions.  Plan:   Will seek further understanding on scale calibration. Will contact pharmacy regarding medication assistance. Routine home visit scheduled for next month.  [facility] CM Care Plan Problem One        Patient Outreach from 01/07/2015 in [place] Problem One  hypertension   Care Plan for Problem One  Active   THN Long Term Goal (31-90 days)  Member's blood pressure will be within provided range (from primary physician) within the next 60 days   THN Long Term Goal Start Date  01/07/15 [Goal date reset due to change in medications]   Interventions for Problem One Long Term Goal  Discussed the importance of monitoring blood pressure    THN CM Short Term Goal #1 (0-30 days)  Member will report obtaining blood pressure machine within the next 2 weeks  THN CM Short Term Goal #1 Start Date  11/26/14 [Goal not met, start date reset]   THN CM Short Term Goal #1 Met Date  01/07/15   Interventions for Short Term Goal #1  Discussed ability to physically and financially obtain blood pressure machine.  Reports that her daughter works at [employer] and will be able to get one. Discussed with social worker the possibility of providing one for member   THN CM Short Term Goal #2 (0-30 days)  Will be able to verbalize personal target goal for blood pressure   THN CM Short Term Goal #2 Start Date  09/24/14   [facility] CM Short Term Goal #2 Met Date  10/22/14   Interventions for Short Term Goal #2  Will discuss with physician the target for member's blood pressure   THN CM Short Term Goal #3 (0-30 days)  Member will monitor and record blood pressure on a daily basis within the next 4 weeks   THN CM Short Term Goal #3 Start Date  01/07/15 [original goal date not met, date reset]   Interventions for Short Tern Goal #3  Discussed the importance of monitoring blood pressure     THN CM Care Plan Problem Two        Patient Outreach from 01/07/2015 in [place]  Problem Two  Weight gain   Care Plan for Problem Two  Active   Interventions for Problem Two Long Term Goal   Discussed ways to decrease weight, including exercise and diet   THN Long Term Goal (31-90) days  Member will report weight loss over the next 90 days   THN Long Term Goal Start Date  01/07/15 [Original date not met, goal date reset]   THN CM Short Term Goal #1 (0-30 days)  Member will weigh self at least once a week for the next 4 weeks to monitor weight gain/loss   THN CM Short Term Goal #1 Start Date  01/07/15 [Goal not met, start date updated]   Interventions for Short Term Goal #2   Discussed importance of monitoring weight to verify progress   THN CM Short Term Goal #2 (0-30 days)  Member will begin using [employer] program to become active within the next 4 weeks, working out at least twice a week   [facility] CM Short Term Goal #2 Start Date  01/07/15 [Goal not met, start date reset]   Interventions for Short Term Goal #2  Discussed the importance of becoming active and working out in reference to weight loss   THN CM Short Term Goal #3 (0-30 days)  Member will obtain an accurate scale within the next 2 weeks   THN CM Short Term Goal #3 Start Date  11/26/14   [facility] CM Short Term Goal #3 Met Date  01/07/15   Interventions for Short Term Goal #3  Discussed with social worker the possibility of providing a scale, current one only goes up to 260 pounds, member is over 300 pounds    [facility] CM Care Plan Problem Three        Patient Outreach from 12/12/2014 in [place] Problem Three  patient needs homemodifications to reduce falls   Care Plan for Problem Three  Active   THN Long Term Goal (31-90) days  patient to have community resource in place to repair rails on her front porch stairs within 90 dyas   Interventions for Problem Three Long Term Goal  continued encouragement to submit eligibilty paperwork given, home visit scheduled for 12/09/14 to follow up [continued  encouragement to complete eligibility paperwork]     Valente David, BSN, [place] Manager [phone]

## 2015-01-09 NOTE — Addendum Note (Signed)
Addended byKemper Durie on: 01/09/2015 12:50 PM   Modules accepted: Orders

## 2015-01-12 NOTE — Patient Outreach (Signed)
[facility]) Care Management  01/12/2015  Jamie Mason 07/10/1940 [id number]   Request from [name], RN to assign Pharmacy, assigned [facility], [place] D.  Corrie Mckusick. Sharlee Blew [facility] Care Management [facility] CM Assistant Phone: [phone] Fax: [phone]

## 2015-01-19 ENCOUNTER — Other Ambulatory Visit: Payer: Self-pay | Admitting: *Deleted

## 2015-01-19 NOTE — Patient Outreach (Signed)
[facility]) Care Management  01/19/2015  Jamie Mason [birth year] [id number]  Phone call to patient to follow up on status of referral to [employer].  Patient's voicemail full, no message able to be left.    [name] [facility] Care Management [phone]

## 2015-01-21 ENCOUNTER — Other Ambulatory Visit: Payer: Self-pay | Admitting: Pharmacist

## 2015-01-21 ENCOUNTER — Other Ambulatory Visit: Payer: Self-pay | Admitting: Internal Medicine

## 2015-01-21 MED ORDER — ATORVASTATIN CALCIUM 40 MG PO TABS: 40.0000 mg | ORAL_TABLET | Freq: Every day | ORAL | Status: DC

## 2015-01-21 NOTE — Patient Outreach (Signed)
[facility]) Care Management  [facility] CM Pharmacy   01/21/2015  Jamie Mason Aug 30, 1940 [id number]  Subjective: Jamie Mason is a 74 y.o. who was referred to [facility] services for medication assistance. Patient is switching from amlodipine to verapamil and from pravastatin to rosuvastatin. One of this medications costs $140 and she is unable to afford that. She is also interested in using mail order pharmacy.   I called the patient today to follow up.   Objective:   Current Medications: Current Outpatient Prescriptions  Medication Sig Dispense Refill  . albuterol (PROVENTIL HFA;VENTOLIN HFA) 108 (90 BASE) MCG/ACT inhaler Inhale 2 puffs into the lungs every 6 (six) hours as needed for wheezing or shortness of breath. 1 Inhaler 3  . amLODipine (NORVASC) 10 MG tablet Take 1 tablet (10 mg total) by mouth daily. 90 tablet 3  . aspirin EC 81 MG tablet Take 1 tablet (81 mg total) by mouth daily. 90 tablet 3  . atenolol (TENORMIN) 50 MG tablet Take 1 tablet (50 mg total) by mouth daily. 90 tablet 3  . hydrochlorothiazide (HYDRODIURIL) 25 MG tablet TAKE 1 TABLET (25 MG TOTAL) BY MOUTH DAILY. 30 tablet 5  . ibuprofen (ADVIL,MOTRIN) 200 MG tablet Take 200 mg by mouth every 6 (six) hours as needed for moderate pain.    . metFORMIN (GLUCOPHAGE) 500 MG tablet Take 1 tablet (500 mg total) by mouth 2 (two) times daily with a meal. 60 tablet 3  . pravastatin (PRAVACHOL) 40 MG tablet Take 1 tablet (40 mg total) by mouth daily. 90 tablet 3  . rosuvastatin (CRESTOR) 20 MG tablet Take 1 tablet (20 mg total) by mouth at bedtime. (Patient not taking: Reported on 01/07/2015) 90 tablet 3  . spironolactone (ALDACTONE) 25 MG tablet Take 1 tablet (25 mg total) by mouth daily. 30 tablet 3  . verapamil (CALAN-SR) 240 MG CR tablet Take 1 tablet (240 mg total) by mouth daily. (Patient not taking: Reported on 01/07/2015) 90 tablet 3   No current facility-administered medications for this visit.     Functional Status: In your present state of health, do you have any difficulty performing the following activities: 12/11/2014 11/26/2014  Hearing? N N  Vision? N Y  Difficulty concentrating or making decisions? Malvin Johns  Walking or climbing stairs? Y N  Dressing or bathing? N N  Doing errands, shopping? Y Y  Quarry manager and eating ? - N  Using the Toilet? - N  In the past six months, have you accidently leaked urine? - Y  Do you have problems with loss of bowel control? - N  Managing your Medications? - Y  Managing your Finances? - N  Housekeeping or managing your Housekeeping? - N    Fall/Depression Screening: PHQ 2/9 Scores 12/11/2014 11/26/2014 10/22/2014 09/24/2014 08/28/2014 05/30/2014 05/02/2014  PHQ - 2 Score 0 0 0 0 0 0 0    Assessment: 1. Medication assistance: patient unable to afford her rosuvastatin.  2. Mail Order Pharmacy: patient interested in using mail order pharmacy but is not sure how to set it up. She currently has [employer].   Plan: 1. Medication assistance: patient unable to afford rosuvastatin. Atorvastatin would be a more affordable alternative. Will reach out to Dr. Rogelia Boga and request that the patient be switched to atorvastatin.  2. Mail Order Pharmacy: instructed patient on how mail order pharmacy works. Her physician will have to send new prescriptions to [facility] as they cannot be transferred from her current  pharmacy. The contact information for the pharmacy is the phone number on her [employer] Part D card. Patient would like to speak with the pharmacy first to see what the prices will be. I will call her on 01/23/15 to follow up and see if she is still interested in using mail order.   Juanita Craver, PharmD, BCPS Clinical Pharmacist [facility] [phone]

## 2015-01-23 ENCOUNTER — Other Ambulatory Visit: Payer: Self-pay | Admitting: Pharmacist

## 2015-01-23 NOTE — Patient Outreach (Signed)
[facility]) Care Management  [facility] CM Pharmacy   01/23/2015  Jamie Mason July 25, 1940 [id number]  Subjective: Jamie Mason is a 74 y.o. who was [facility] services for medication assistance. Patient is switching from amlodipine to verapamil and from pravastatin to rosuvastatin. One of this medications costs $140 and she is unable to afford that. She is also interested in using mail order pharmacy.   I contacted Dr. Rogelia Boga on 01/21/15 and she switched the rosuvastatin to atorvastatin. The patient reported today (01/23/15) that the cost is now $20 and she is able to afford this. She denies calling [employer] but reports that she will call them today.    Objective:   Current Medications: Current Outpatient Prescriptions  Medication Sig Dispense Refill  . albuterol (PROVENTIL HFA;VENTOLIN HFA) 108 (90 BASE) MCG/ACT inhaler Inhale 2 puffs into the lungs every 6 (six) hours as needed for wheezing or shortness of breath. 1 Inhaler 3  . amLODipine (NORVASC) 10 MG tablet Take 1 tablet (10 mg total) by mouth daily. 90 tablet 3  . aspirin EC 81 MG tablet Take 1 tablet (81 mg total) by mouth daily. 90 tablet 3  . atenolol (TENORMIN) 50 MG tablet Take 1 tablet (50 mg total) by mouth daily. 90 tablet 3  . atorvastatin (LIPITOR) 40 MG tablet Take 1 tablet (40 mg total) by mouth daily. 90 tablet 1  . hydrochlorothiazide (HYDRODIURIL) 25 MG tablet TAKE 1 TABLET (25 MG TOTAL) BY MOUTH DAILY. 30 tablet 5  . ibuprofen (ADVIL,MOTRIN) 200 MG tablet Take 200 mg by mouth every 6 (six) hours as needed for moderate pain.    . metFORMIN (GLUCOPHAGE) 500 MG tablet Take 1 tablet (500 mg total) by mouth 2 (two) times daily with a meal. 60 tablet 3  . spironolactone (ALDACTONE) 25 MG tablet Take 1 tablet (25 mg total) by mouth daily. 30 tablet 3  . verapamil (CALAN-SR) 240 MG CR tablet Take 1 tablet (240 mg total) by mouth daily. (Patient not taking: Reported on 01/07/2015) 90  tablet 3   No current facility-administered medications for this visit.    Functional Status: In your present state of health, do you have any difficulty performing the following activities: 12/11/2014 11/26/2014  Hearing? N N  Vision? N Y  Difficulty concentrating or making decisions? Malvin Johns  Walking or climbing stairs? Y N  Dressing or bathing? N N  Doing errands, shopping? Y Y  Quarry manager and eating ? - N  Using the Toilet? - N  In the past six months, have you accidently leaked urine? - Y  Do you have problems with loss of bowel control? - N  Managing your Medications? - Y  Managing your Finances? - N  Housekeeping or managing your Housekeeping? - N    Fall/Depression Screening: PHQ 2/9 Scores 12/11/2014 11/26/2014 10/22/2014 09/24/2014 08/28/2014 05/30/2014 05/02/2014  PHQ - 2 Score 0 0 0 0 0 0 0    Assessment: 1. Medication assistance: patient able to afford her cholesterol medication now that it has been switched from rosuvastatin to atorvastatin.  2. Mail Order Pharmacy: patient interested in using mail order pharmacy but has not contacted them yet.   Plan: 1. Medication assistance: no further recommendations or interventions at this time.  2. Mail Order Pharmacy: patient to call [facility] today and will call me back once she has contacted them to let me know if she has any issues.   Juanita Craver, PharmD,  BCPS Clinical Pharmacist [facility] [phone]

## 2015-01-28 ENCOUNTER — Other Ambulatory Visit: Payer: Self-pay | Admitting: Pharmacist

## 2015-01-28 NOTE — Patient Outreach (Signed)
[facility]) Care Management  [place]   01/28/2015  Jamie Mason [birth year] [id number]  Subjective: Jamie Mason is a 74 y.o. who was referred to [place] services for medication assistance.   No further pharmacy interventions are needed so I will close the patient case out of the pharmacy program.    Objective:   Current Medications: Current Outpatient Prescriptions  Medication Sig Dispense Refill  . albuterol (PROVENTIL HFA;VENTOLIN HFA) 108 (90 BASE) MCG/ACT inhaler Inhale 2 puffs into the lungs every 6 (six) hours as needed for wheezing or shortness of breath. 1 Inhaler 3  . amLODipine (NORVASC) 10 MG tablet Take 1 tablet (10 mg total) by mouth daily. 90 tablet 3  . aspirin EC 81 MG tablet Take 1 tablet (81 mg total) by mouth daily. 90 tablet 3  . atenolol (TENORMIN) 50 MG tablet Take 1 tablet (50 mg total) by mouth daily. 90 tablet 3  . atorvastatin (LIPITOR) 40 MG tablet Take 1 tablet (40 mg total) by mouth daily. 90 tablet 1  . hydrochlorothiazide (HYDRODIURIL) 25 MG tablet TAKE 1 TABLET (25 MG TOTAL) BY MOUTH DAILY. 30 tablet 5  . ibuprofen (ADVIL,MOTRIN) 200 MG tablet Take 200 mg by mouth every 6 (six) hours as needed for moderate pain.    . metFORMIN (GLUCOPHAGE) 500 MG tablet Take 1 tablet (500 mg total) by mouth 2 (two) times daily with a meal. 60 tablet 3  . spironolactone (ALDACTONE) 25 MG tablet Take 1 tablet (25 mg total) by mouth daily. 30 tablet 3  . verapamil (CALAN-SR) 240 MG CR tablet Take 1 tablet (240 mg total) by mouth daily. (Patient not taking: Reported on 01/07/2015) 90 tablet 3   No current facility-administered medications for this visit.    Functional Status: In your present state of health, do you have any difficulty performing the following activities: 12/11/2014 11/26/2014  Hearing? N N  Vision? N Y  Difficulty concentrating or making decisions? [name]  Walking or climbing stairs? Y N  Dressing or bathing? N N  Doing  errands, shopping? Y Y  Conservation officer, nature and eating ? - N  Using the Toilet? - N  In the past six months, have you accidently leaked urine? - Y  Do you have problems with loss of bowel control? - N  Managing your Medications? - Y  Managing your Finances? - N  Housekeeping or managing your Housekeeping? - N    Fall/Depression Screening: PHQ 2/9 Scores 12/11/2014 11/26/2014 10/22/2014 09/24/2014 08/28/2014 05/30/2014 05/02/2014  PHQ - 2 Score 0 0 0 0 0 0 0    Assessment: 1. All pharmacy goals have been met. Patient was able to get cheaper alternative medications and knows how to set up mail order pharmacy if she decides to use it.   Plan: 1. Will close out of the pharmacy program.  Nicoletta Ba, PharmD, [place] [phone]

## 2015-02-03 ENCOUNTER — Other Ambulatory Visit: Payer: Self-pay | Admitting: *Deleted

## 2015-02-03 NOTE — Patient Outreach (Signed)
[facility] [facility]) Care Management   02/03/2015  Jamie Mason 27-Jul-1940 [id number]  Jamie Mason is an 74 y.o. female  Subjective:   Objective:   Review of Systems  Constitutional: Negative.   HENT: Negative.   Eyes: Negative.   Respiratory: Negative.   Cardiovascular: Positive for leg swelling.  Gastrointestinal: Negative.   Genitourinary: Negative.   Musculoskeletal: Negative.   Skin: Negative.   Neurological: Negative.   Endo/Heme/Allergies: Negative.   Psychiatric/Behavioral: Negative.     Physical Exam  Constitutional: She is oriented to person, place, and time. She appears well-developed and well-nourished.  Neck: Normal range of motion.  Cardiovascular: Normal rate, regular rhythm and normal heart sounds.   Respiratory: Effort normal and breath sounds normal.  GI: Soft. Bowel sounds are normal.  Musculoskeletal: Normal range of motion.  Neurological: She is alert and oriented to person, place, and time.  Skin: Skin is warm and dry.   BP 156/82 mmHg  Pulse 70  Resp 22  SpO2 98%  Current Medications:   Current Outpatient Prescriptions  Medication Sig Dispense Refill  . aspirin EC 81 MG tablet Take 1 tablet (81 mg total) by mouth daily. 90 tablet 3  . atenolol (TENORMIN) 50 MG tablet Take 1 tablet (50 mg total) by mouth daily. 90 tablet 3  . atorvastatin (LIPITOR) 40 MG tablet Take 1 tablet (40 mg total) by mouth daily. 90 tablet 1  . hydrochlorothiazide (HYDRODIURIL) 25 MG tablet TAKE 1 TABLET (25 MG TOTAL) BY MOUTH DAILY. 30 tablet 5  . metFORMIN (GLUCOPHAGE) 500 MG tablet Take 1 tablet (500 mg total) by mouth 2 (two) times daily with a meal. 60 tablet 3  . spironolactone (ALDACTONE) 25 MG tablet Take 1 tablet (25 mg total) by mouth daily. 30 tablet 3  . albuterol (PROVENTIL HFA;VENTOLIN HFA) 108 (90 BASE) MCG/ACT inhaler Inhale 2 puffs into the lungs every 6 (six) hours as needed for wheezing or shortness of breath. 1 Inhaler 3  . amLODipine  (NORVASC) 10 MG tablet Take 1 tablet (10 mg total) by mouth daily. 90 tablet 3  . ibuprofen (ADVIL,MOTRIN) 200 MG tablet Take 200 mg by mouth every 6 (six) hours as needed for moderate pain.    . verapamil (CALAN-SR) 240 MG CR tablet Take 1 tablet (240 mg total) by mouth daily. (Patient not taking: Reported on 01/07/2015) 90 tablet 3   No current facility-administered medications for this visit.    Functional Status:   In your present state of health, do you have any difficulty performing the following activities: 12/11/2014 11/26/2014  Hearing? N N  Vision? N Y  Difficulty concentrating or making decisions? Jamie Donning  Walking or climbing stairs? Y N  Dressing or bathing? N N  Doing errands, shopping? Y Y  Conservation officer, nature and eating ? - N  Using the Toilet? - N  In the past six months, have you accidently leaked urine? - Y  Do you have problems with loss of bowel control? - N  Managing your Medications? - Y  Managing your Finances? - N  Housekeeping or managing your Housekeeping? - N    Fall/Depression Screening:    PHQ 2/9 Scores 12/11/2014 11/26/2014 10/22/2014 09/24/2014 08/28/2014 05/30/2014 05/02/2014  PHQ - 2 Score 0 0 0 0 0 0 0    Assessment:    Met with member at scheduled time.  She reports that she has been doing well.  She states that she has been able to obtain one of  her new medications, but can't recall which one.  Medications reviewed, member has Atorvastatin (which has replaced the new prescription for Crestor) however member also still has Pravastatin.  Member was to complete taking pravastatin prior to starting the Atorvastatin.  She is unable to state if she has been taking both at the same time.  Member advised to continue the Pravastatin (she does not want to waste the pills due to financial reasons) and not to take the Atorvastatin until after all pravastatin has been taken.  She verbalizes understanding.  Member is completely out of Amlodipine, and was to start taking Verapamil  in its place.  She has not obtained the Verapamil from the pharmacy.  She states that she though the medicine she obtained (Atorvastatin) was the one for her blood pressure.  Difference between Atorvastatin and Verapamil discussed.  Pharmacy called to inquire about the Verapamil prescription, request made to fill.  Member states that she will obtain today and begin taking immediately.  Although member reports not taking neither Verapamil nor Amlodipine over the past couple weeks, her blood pressure today is decreased from the previous home visit.  Member encouraged again to take blood pressure every day and document readings.    Member reports that she is having difficulty with taking her husband to his appointments.  She states that she has requested some assistance from escort services (she does not know which ones) but reports that she is unable to use them due to the cost.  Her husband is blind and will need someone to accompany him and stay with him throughout the appointments.  She reports that she has been having trouble physically being able to escort him, and does not have the strength to continue.  Member states that this has been a stressor on her as well as some other personal problems she has encountered over the past few months.  She states that she has had some financial concerns related to her health (unable to see specialists due to copay, inability to purchase new medications, obesity related concerns) and reports that she does not qualify for Medicaid.  Member made aware that this care manager would notify social worker of financial concerns and the need for resources for escort service for husband.  Mrs. Mason states that she still has not been able to weigh herself, but states that she has not lost any weight.  Member made aware that the scale that was provided should already be calibrated, and that it works best on a hard, flat surface.  Member was previously attempting to use on carpet in  the living room.  Member states that she does not have a hard surface in the home.  This care manager inquired about the kitchen or the bathroom.  She states that there was carpet previously in those rooms, but has now been removed.  She reports that removing the carpet has left the floors uneven and sloped.  She denies there being any one place with a flat surface for her to weigh herself.  She reports needing work done to the home, but is unable to afford it.  She states that she would like to for someone to assess the home for repairs, however does not want anyone to come in the home until she is able to "clean it up a little."  She reports that cleaning has been difficult because she is easily tired and her husband is unable to assist, but she states that she will take it "one  day at a time."  Member encouraged to discuss this with the social worker when she is contacted.  Member denies any further concerns at this time.  Encouraged to contact this care manager with any questions.  Plan:   Will contact social worker regarding member's concerns. Routine home visit scheduled for next month.  THN CM Care Plan Problem One        Most Recent Value   Care Plan Problem One  hypertension   Role Documenting the Problem One  Care Management Coordinator   Care Plan for Problem One  Active   THN Long Term Goal (31-90 days)  Member's blood pressure will be within provided range (from primary physician) within the next 60 days   THN Long Term Goal Start Date  01/07/15 [Goal date reset due to change in medications]   Interventions for Problem One Long Term Goal  Discussed the importance of monitoring blood pressure    THN CM Short Term Goal #1 (0-30 days)  Member will report obtaining blood pressure machine within the next 2 weeks   THN CM Short Term Goal #1 Start Date  11/26/14 [Goal not met, start date reset]   [facility] CM Short Term Goal #1 Met Date  01/07/15   Interventions for Short Term Goal #1  Discussed  ability to physically and financially obtain blood pressure machine.  Reports that her daughter works at [employer] and will be able to get one. Discussed with social worker the possibility of providing one for member   THN CM Short Term Goal #2 (0-30 days)  Will be able to verbalize personal target goal for blood pressure   THN CM Short Term Goal #2 Start Date  09/24/14   [facility] CM Short Term Goal #2 Met Date  10/22/14   Interventions for Short Term Goal #2  Will discuss with physician the target for member's blood pressure   THN CM Short Term Goal #3 (0-30 days)  Member will monitor and record blood pressure on a daily basis within the next 4 weeks   THN CM Short Term Goal #3 Start Date  02/03/15 [original goal date not met, date reset]   Interventions for Short Tern Goal #3  Discussed the importance of monitoring blood pressure     THN CM Care Plan Problem Two        Most Recent Value   Care Plan Problem Two  Weight gain   Role Documenting the Problem Two  Care Management Coordinator   Care Plan for Problem Two  Active   Interventions for Problem Two Long Term Goal   Discussed ways to decrease weight, including exercise and diet   THN Long Term Goal (31-90) days  Member will report weight loss over the next 90 days   THN Long Term Goal Start Date  01/07/15 [Original date not met, goal date reset]   THN CM Short Term Goal #1 (0-30 days)  Member will weigh self at least once a week for the next 4 weeks to monitor weight gain/loss   THN CM Short Term Goal #1 Start Date  02/03/15 [Goal not met, start date updated, highly encouraged to weigh]   Interventions for Short Term Goal #2   Discussed importance of monitoring weight to verify progress   THN CM Short Term Goal #2 (0-30 days)  Member will begin using Silver Sneakers program to become active within the next 4 weeks, working out at least twice a week   [facility] CM Short Term  Goal #2 Start Date  01/07/15 [Goal not met, start date reset]   THN CM Short  Term Goal #2 Met Date  -- [Goal not met]   Interventions for Short Term Goal #2  Discussed the importance of becoming active and working out in reference to weight loss   THN CM Short Term Goal #3 (0-30 days)  Member will obtain an accurate scale within the next 2 weeks   THN CM Short Term Goal #3 Start Date  11/26/14   [facility] CM Short Term Goal #3 Met Date  01/07/15   Interventions for Short Term Goal #3  Discussed with social worker the possibility of providing a scale, current one only goes up to 260 pounds, member is over 300 pounds     Valente David, BSN, [place] Manager [phone]

## 2015-02-10 ENCOUNTER — Other Ambulatory Visit: Payer: Self-pay | Admitting: *Deleted

## 2015-02-10 DIAGNOSIS — R0600 Dyspnea, unspecified: Secondary | ICD-10-CM

## 2015-02-10 DIAGNOSIS — I2510 Atherosclerotic heart disease of native coronary artery without angina pectoris: Secondary | ICD-10-CM

## 2015-02-10 DIAGNOSIS — E8881 Metabolic syndrome: Secondary | ICD-10-CM

## 2015-02-10 DIAGNOSIS — I10 Essential (primary) hypertension: Secondary | ICD-10-CM

## 2015-02-11 MED ORDER — ATORVASTATIN CALCIUM 40 MG PO TABS: 40.0000 mg | ORAL_TABLET | Freq: Every day | ORAL | Status: DC

## 2015-02-11 MED ORDER — ALBUTEROL SULFATE HFA 108 (90 BASE) MCG/ACT IN AERS: 2.0000 | INHALATION_SPRAY | Freq: Four times a day (QID) | RESPIRATORY_TRACT | Status: DC | PRN

## 2015-02-11 MED ORDER — SPIRONOLACTONE 25 MG PO TABS: 25.0000 mg | ORAL_TABLET | Freq: Every day | ORAL | Status: DC

## 2015-02-11 MED ORDER — HYDROCHLOROTHIAZIDE 25 MG PO TABS: 25.0000 mg | ORAL_TABLET | Freq: Every day | ORAL | Status: DC

## 2015-02-11 MED ORDER — AMLODIPINE BESYLATE 10 MG PO TABS: 10.0000 mg | ORAL_TABLET | Freq: Every day | ORAL | Status: DC

## 2015-02-11 MED ORDER — ATENOLOL 50 MG PO TABS: 50.0000 mg | ORAL_TABLET | Freq: Every day | ORAL | Status: DC

## 2015-02-11 MED ORDER — METFORMIN HCL 500 MG PO TABS: 500.0000 mg | ORAL_TABLET | Freq: Two times a day (BID) | ORAL | Status: DC

## 2015-02-11 MED ORDER — ASPIRIN EC 81 MG PO TBEC: 81.0000 mg | DELAYED_RELEASE_TABLET | Freq: Every day | ORAL | Status: AC

## 2015-03-03 ENCOUNTER — Other Ambulatory Visit: Payer: Self-pay | Admitting: *Deleted

## 2015-03-03 NOTE — Patient Outreach (Signed)
[facility]) Care Management   03/03/2015  Jamie Mason [birth year] [id number]  Jamie Mason is an 74 y.o. female  Subjective:   Member states that she is "alright."  She denies any pain or discomfort at this time.  She reports that she has been taking all of her medications as prescribed, however, she has not been taking her blood pressure as instructed.  Objective:   Review of Systems  Constitutional: Negative.   HENT: Negative.   Eyes: Negative.   Respiratory: Negative.   Cardiovascular: Negative.   Gastrointestinal: Negative.   Genitourinary: Negative.   Musculoskeletal: Negative.   Skin: Negative.   Neurological: Negative.   Endo/Heme/Allergies: Negative.   Psychiatric/Behavioral: Negative.     Physical Exam  Constitutional: She is oriented to person, place, and time. She appears well-developed and well-nourished.  Neck: Normal range of motion.  Cardiovascular: Normal rate, regular rhythm and normal heart sounds.   Respiratory: Effort normal and breath sounds normal.  GI: Soft. Bowel sounds are normal.  Musculoskeletal: Normal range of motion.  Neurological: She is alert and oriented to person, place, and time.  Skin: Skin is warm and dry.   BP 174/90 mmHg  Pulse 64  SpO2 97%  Current Medications:   Current Outpatient Prescriptions  Medication Sig Dispense Refill  . albuterol (PROVENTIL HFA;VENTOLIN HFA) 108 (90 BASE) MCG/ACT inhaler Inhale 2 puffs into the lungs every 6 (six) hours as needed for wheezing or shortness of breath. 3 Inhaler 3  . aspirin EC 81 MG tablet Take 1 tablet (81 mg total) by mouth daily. 90 tablet 3  . atenolol (TENORMIN) 50 MG tablet Take 1 tablet (50 mg total) by mouth daily. 90 tablet 3  . hydrochlorothiazide (HYDRODIURIL) 25 MG tablet Take 1 tablet (25 mg total) by mouth daily. 90 tablet 3  . ibuprofen (ADVIL,MOTRIN) 200 MG tablet Take 200 mg by mouth every 6 (six) hours as needed for moderate pain.    . metFORMIN  (GLUCOPHAGE) 500 MG tablet Take 1 tablet (500 mg total) by mouth 2 (two) times daily with a meal. 180 tablet 3  . spironolactone (ALDACTONE) 25 MG tablet Take 1 tablet (25 mg total) by mouth daily. 90 tablet 3  . verapamil (CALAN-SR) 240 MG CR tablet Take 1 tablet (240 mg total) by mouth daily. 90 tablet 3  . amLODipine (NORVASC) 10 MG tablet Take 1 tablet (10 mg total) by mouth daily. (Patient not taking: Reported on 03/03/2015) 90 tablet 3  . atorvastatin (LIPITOR) 40 MG tablet Take 1 tablet (40 mg total) by mouth daily. (Patient not taking: Reported on 03/03/2015) 90 tablet 3   No current facility-administered medications for this visit.    Functional Status:   In your present state of health, do you have any difficulty performing the following activities: 12/11/2014 11/26/2014  Hearing? N N  Vision? N Y  Difficulty concentrating or making decisions? [name]  Walking or climbing stairs? Y N  Dressing or bathing? N N  Doing errands, shopping? Y Y  Conservation officer, nature and eating ? - N  Using the Toilet? - N  In the past six months, have you accidently leaked urine? - Y  Do you have problems with loss of bowel control? - N  Managing your Medications? - Y  Managing your Finances? - N  Housekeeping or managing your Housekeeping? - N    Fall/Depression Screening:    [facility] 2/9 Scores 12/11/2014 11/26/2014 10/22/2014 09/24/2014 08/28/2014 05/30/2014 05/02/2014  PHQ -  2 Score 0 0 0 0 0 0 0    Assessment:    Although member states she has been taking her medications, her blood pressure remains elevated.  Medications reviewed, member has all medications, but points out a pill that she is not taking, stating that was waiting until she finished her other medication for her "heart" (blood pressure) prior to starting.  Medications reviewed and determined that the member was taking both Pravastatin and Atorvastatin, thinking that one was for cholesterol and the other was for blood pressure.  The medication that she  was not taking was the Verapamil, nor was she taking the Amlodipine (which was discontinued).  Member instructed to immediately begin taking this med.  Atorvastatin removed from her medication bag, instructed not to take until all of the pravastatin was complete (already approved by PCP), which according to the pill count should be in approximately 3 weeks.  Member verbalized understanding.  Again discussed the importance of taking medications as prescribed and monitoring blood pressure as this will help her PCP manager her hypertension appropriately.  Member also discusses her shortness of breath with walking long distances.  She questions if it could be due to her weight.  Member advised that weight does have a factor in breathing, as it also affects blood pressure.  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  Member encouraged to seek out some home exercise routines that would provide some benefit.  She denies any other questions or concerns at this time.  Member encouraged again to check blood pressure and record readings daily, before and after taking medications and to contact this care manager if her blood pressure remains elevated.  Plan:   Routine home visit scheduled for next month.  THN CM Care Plan Problem One        Most Recent Value   Care Plan Problem One  hypertension   Role Documenting the Problem One  Care Management Coordinator   Care Plan for Problem One  Active   THN Long Term Goal (31-90 days)  Member's blood pressure will be within provided range (from primary physician) within the next 60 days   THN Long Term Goal Start Date  03/03/15 [Goal date reset due to change in medications]   Interventions for Problem One Long Term Goal  Discussed the importance of monitoring blood pressure    THN CM Short Term Goal #1 (0-30 days)  Member will  take medications as prescribed for the next 4 weeks   THN CM Short Term Goal #1 Start Date  03/03/15   Interventions for Short Term Goal #1  Discussed the importance of taking medications as prescribed to manage blood pressure   THN CM Short Term Goal #2 (0-30 days)  Member will increase exercise in effort to manage blood pressure   THN CM Short Term Goal #2 Start Date  03/03/15   Interventions for Short Term Goal #2  Discussed the effects that weight loss has on overall health status, especially blood pressure   THN CM Short Term Goal #3 (0-30 days)  Member will monitor and record blood pressure on a daily basis within the next 4 weeks   THN CM Short Term Goal #3 Start Date  03/03/15 [original goal date not met, date reset]   Interventions for Short Tern Goal #3  Discussed the importance  of monitoring blood pressure     THN CM Care Plan Problem Two        Most Recent Value   Care Plan Problem Two  Weight gain   Role Documenting the Problem Two  Care Management Coordinator   Care Plan for Problem Two  Active   Interventions for Problem Two Long Term Goal   Discussed ways to decrease weight, including exercise and diet   THN Long Term Goal (31-90) days  Member will report weight loss over the next 90 days   THN Long Term Goal Start Date  01/07/15 [Original date not met, goal date reset]   THN CM Short Term Goal #1 (0-30 days)  Member will weigh self at least once a week for the next 4 weeks to monitor weight gain/loss   THN CM Short Term Goal #1 Start Date  02/03/15 [Goal not met, start date updated, highly encouraged to weigh]   THN CM Short Term Goal #1 Met Date   -- [Goal not met]   Interventions for Short Term Goal #2   Discussed importance of monitoring weight to verify progress   THN CM Short Term Goal #2 (0-30 days)  Member will begin using [employer]ne[employer] program to become active within the next 4 weeks, working out at least twice a week   [facility] CM Short Term Goal #2 Start Date  01/07/15  [Goal not met, start date reset]   THN CM Short Term Goal #2 Met Date  -- [Goal not met]   Interventions for Short Term Goal #2  Discussed the importance of becoming active and working out in reference to weight loss   THN CM Short Term Goal #3 (0-30 days)  Member will obtain an accurate scale within the next 2 weeks   THN CM Short Term Goal #3 Start Date  11/26/14   [facility] CM Short Term Goal #3 Met Date  01/07/15   Interventions for Short Term Goal #3  Discussed with social worker the possibility of providing a scale, current one only goes up to 260 pounds, member is over 300 pounds     Valente David, BSN, [place] Manager [phone]

## 2015-03-10 ENCOUNTER — Other Ambulatory Visit: Payer: Self-pay | Admitting: *Deleted

## 2015-03-10 NOTE — Patient Outreach (Signed)
[facility]) Care Management  03/10/2015  Jamie Mason Apr 06, 1941 [id number]   Phone call to patient to follow up on referral to the [employer].  Per patient , she wants to put that referral on hold for now as she has a lot of things that she needs to clear out of her home before anyone can  come in.  This social worker emphaiszed the need to not have that that get in her way of receiving assistance and also gave her aother option for assistance, working with the [place] [facility] [place] who has funds avaialble for making patient's homes more accessible.    Plan:  Patient has declined [employer] at this time.  Is willing to receive information regarding the [place] [facility] [place] by mail.  Information will be mailed 03/11/15.    [name] [facility] Care Management [phone]

## 2015-03-12 ENCOUNTER — Ambulatory Visit: Payer: Commercial Managed Care - HMO | Admitting: Internal Medicine

## 2015-03-31 ENCOUNTER — Ambulatory Visit: Payer: Commercial Managed Care - HMO | Admitting: *Deleted

## 2015-04-01 ENCOUNTER — Other Ambulatory Visit: Payer: Self-pay | Admitting: *Deleted

## 2015-04-01 NOTE — Patient Outreach (Signed)
Call received from member in regards to missed appointment on yesterday.  Member made aware that this care manager had appointment canceled due to family emergency.  Appointment (routine home visit) rescheduled for next week.  Kemper DurieMonica Welda Azzarello, BSN, [facility] Care Management  [facility] Care [facility] [phone]

## 2015-04-09 ENCOUNTER — Other Ambulatory Visit: Payer: Self-pay | Admitting: *Deleted

## 2015-04-09 ENCOUNTER — Encounter: Payer: Self-pay | Admitting: *Deleted

## 2015-04-09 NOTE — Patient Outreach (Signed)
[facility]) Care Management   04/09/2015  Jamie Mason 14-Aug-1940 [id number]  Jamie Mason is an 74 y.o. female  Subjective:   Member states that she is "doing."  She denies any pain or discomfort.  She reports that she has been taking her medications as prescribed, however has not been monitoring her blood pressure daily as instructed.  She states that she does not want to take her blood pressure in fear of what it will be.  Objective:   Review of Systems  Constitutional: Negative.   HENT: Negative.   Eyes: Negative.   Respiratory: Negative.   Cardiovascular: Negative.   Gastrointestinal: Negative.   Genitourinary: Negative.   Musculoskeletal: Negative.   Skin: Negative.   Neurological: Negative.   Endo/Heme/Allergies: Negative.   Psychiatric/Behavioral: Negative.     Physical Exam  Constitutional: She is oriented to person, place, and time. She appears well-developed and well-nourished.  Neck: Normal range of motion.  Cardiovascular: Normal rate, regular rhythm and normal heart sounds.   Respiratory: Effort normal and breath sounds normal.  GI: Soft. Bowel sounds are normal.  Musculoskeletal: Normal range of motion.  Neurological: She is alert and oriented to person, place, and time.  Skin: Skin is warm and dry.   BP 191/85 mmHg  Pulse 78  Resp 22  Wt 326 lb (147.873 kg)  SpO2 99%  Current Medications:   Current Outpatient Prescriptions  Medication Sig Dispense Refill  . albuterol (PROVENTIL HFA;VENTOLIN HFA) 108 (90 BASE) MCG/ACT inhaler Inhale 2 puffs into the lungs every 6 (six) hours as needed for wheezing or shortness of breath. 3 Inhaler 3  . aspirin EC 81 MG tablet Take 1 tablet (81 mg total) by mouth daily. 90 tablet 3  . atenolol (TENORMIN) 50 MG tablet Take 1 tablet (50 mg total) by mouth daily. 90 tablet 3  . hydrochlorothiazide (HYDRODIURIL) 25 MG tablet Take 1 tablet (25 mg total) by mouth daily. 90 tablet 3  . ibuprofen  (ADVIL,MOTRIN) 200 MG tablet Take 200 mg by mouth every 6 (six) hours as needed for moderate pain.    . metFORMIN (GLUCOPHAGE) 500 MG tablet Take 1 tablet (500 mg total) by mouth 2 (two) times daily with a meal. 180 tablet 3  . spironolactone (ALDACTONE) 25 MG tablet Take 1 tablet (25 mg total) by mouth daily. 90 tablet 3  . verapamil (CALAN-SR) 240 MG CR tablet Take 1 tablet (240 mg total) by mouth daily. 90 tablet 3  . amLODipine (NORVASC) 10 MG tablet Take 1 tablet (10 mg total) by mouth daily. (Patient not taking: Reported on 03/03/2015) 90 tablet 3  . atorvastatin (LIPITOR) 40 MG tablet Take 1 tablet (40 mg total) by mouth daily. (Patient not taking: Reported on 03/03/2015) 90 tablet 3   No current facility-administered medications for this visit.    Functional Status:   In your present state of health, do you have any difficulty performing the following activities: 12/11/2014 11/26/2014  Hearing? N N  Vision? N Y  Difficulty concentrating or making decisions? [name]  Walking or climbing stairs? Y N  Dressing or bathing? N N  Doing errands, shopping? Y Y  Conservation officer, nature and eating ? - N  Using the Toilet? - N  In the past six months, have you accidently leaked urine? - Y  Do you have problems with loss of bowel control? - N  Managing your Medications? - Y  Managing your Finances? - N  Housekeeping or managing your  Housekeeping? - N    Fall/Depression Screening:    PHQ 2/9 Scores 12/11/2014 11/26/2014 10/22/2014 09/24/2014 08/28/2014 05/30/2014 05/02/2014  PHQ - 2 Score 0 0 0 0 0 0 0    Assessment:    Home visit complete, this care manager inquired again about the member not checking her blood pressure.  Demonstration provided by member to ensure that she is aware of the correct way to use the machine.  Member is able to take her blood pressure independently, but she states that she has not taken her medications yet this morning.  Blood pressure elevated, member encouraged to relax, taking  deep breaths.  Blood pressure rechecked, however still elevated.  Instructed to check blood pressure every day and record in effort for physicians to appropriately manage hypertension.  Instructed to write readings down in [facility] calendar, on hypertension record, and to take with her to her appointment next week.  Member verbalized understanding.  All medications reviewed, member still has not started taking Atorvastatin because she still has pravastatin left.  Discussed with member that according to pill count last month, she should have been finished her pravastatin and started the new medication.  Instructed to inform physician that she has not started taking the Atorvastatin.  Discussed taking all medications as prescribed in effort to decrease risk of hospital admission and to control chronic diseases.  Verbalizes understanding.    Member denies any questions at this time, instructed to contact this care manager with any concerns.  Plan:   Will update physician of member's status with blood pressure monitoring and medication management. Routine home visit scheduled for next month.  [facility] CM Care Plan Problem One        Most Recent Value   Care Plan Problem One  hypertension   Role Documenting the Problem One  Care Management Coordinator   Care Plan for Problem One  Active   [facility] Long Term Goal (31-90 days)  Member's blood pressure will be within provided range (from primary physician) within the next 60 days   [facility] Long Term Goal Start Date  03/03/15 [Goal date reset due to change in medications]   Interventions for Problem One Long Term Goal  Discussed the importance of monitoring blood pressure    [facility] CM Short Term Goal #1 (0-30 days)  Member will take medications as prescribed for the next 4 weeks   [facility] CM Short Term Goal #1 Start Date  04/10/15   Interventions for Short Term Goal #1  Discussed the importance of taking medications as prescribed to manage blood pressure   [facility] CM Short Term Goal  #2 (0-30 days)  Member will increase exercise in effort to manage blood pressure   [facility] CM Short Term Goal #2 Start Date  04/10/15   Interventions for Short Term Goal #2  Discussed the effects that weight loss has on overall health status, especially blood pressure   [facility] CM Short Term Goal #3 (0-30 days)  Member will monitor and record blood pressure on a daily basis within the next 4 weeks   [facility] CM Short Term Goal #3 Start Date  04/10/15 [original goal date not met, date reset]   Interventions for Short Tern Goal #3  Discussed the importance of monitoring blood pressure     [facility] CM Care Plan Problem Two        Most Recent Value   Care Plan Problem Two  Weight gain   Role Documenting the Problem Two  Care Management Coordinator  Care Plan for Problem Two  Not Active   Interventions for Problem Two Long Term Goal   Discussed ways to decrease weight, including exercise and diet   [facility] Long Term Goal (31-90) days  Member will report weight loss over the next 90 days   [facility] Long Term Goal Start Date  01/07/15 [Original date not met, goal date reset]   [facility] Long Term Goal Met Date  -- [goal not met]   [facility] CM Short Term Goal #1 (0-30 days)  Member will weigh self at least once a week for the next 4 weeks to monitor weight gain/loss   [facility] CM Short Term Goal #1 Start Date  02/03/15 [Goal not met, start date updated, highly encouraged to weigh]   [facility] CM Short Term Goal #1 Met Date   -- [Goal not met]   Interventions for Short Term Goal #2   Discussed importance of monitoring weight to verify progress   [facility] CM Short Term Goal #2 (0-30 days)  Member will begin using Silver [employer]neakers program to become active within the next 4 weeks, working out at least twice a week   [facility] CM Short Term Goal #2 Start Date  01/07/15 [Goal not met, start date reset]   [facility] CM Short Term Goal #2 Met Date  -- [Goal not met]   Interventions for Short Term Goal #2  Discussed the importance of becoming active and working out in  reference to weight loss   [facility] CM Short Term Goal #3 (0-30 days)  Member will obtain an accurate scale within the next 2 weeks   [facility] CM Short Term Goal #3 Start Date  11/26/14   [facility] CM Short Term Goal #3 Met Date  01/07/15   Interventions for Short Term Goal #3  Discussed with social worker the possibility of providing a scale, current one only goes up to 260 pounds, member is over 300 pounds     Valente David, BSN, [place] Manager [phone]

## 2015-04-16 ENCOUNTER — Encounter: Payer: Self-pay | Admitting: Internal Medicine

## 2015-04-16 ENCOUNTER — Ambulatory Visit (INDEPENDENT_AMBULATORY_CARE_PROVIDER_SITE_OTHER): Payer: Commercial Managed Care - HMO | Admitting: Internal Medicine

## 2015-04-16 VITALS — BP 211/87 | HR 65 | Temp 98.2°F | Ht 65.5 in | Wt 333.2 lb

## 2015-04-16 DIAGNOSIS — I10 Essential (primary) hypertension: Secondary | ICD-10-CM

## 2015-04-16 DIAGNOSIS — I129 Hypertensive chronic kidney disease with stage 1 through stage 4 chronic kidney disease, or unspecified chronic kidney disease: Secondary | ICD-10-CM

## 2015-04-16 DIAGNOSIS — E1122 Type 2 diabetes mellitus with diabetic chronic kidney disease: Secondary | ICD-10-CM | POA: Diagnosis not present

## 2015-04-16 DIAGNOSIS — N182 Chronic kidney disease, stage 2 (mild): Secondary | ICD-10-CM | POA: Diagnosis not present

## 2015-04-16 DIAGNOSIS — Z6841 Body Mass Index (BMI) 40.0 and over, adult: Secondary | ICD-10-CM

## 2015-04-16 DIAGNOSIS — Z7984 Long term (current) use of oral hypoglycemic drugs: Secondary | ICD-10-CM | POA: Diagnosis not present

## 2015-04-16 LAB — BASIC METABOLIC PANEL
ANION GAP: 8 (ref 5–15)
BUN: 19 mg/dL (ref 6–20)
CALCIUM: 9.4 mg/dL (ref 8.9–10.3)
CO2: 28 mmol/L (ref 22–32)
Chloride: 100 mmol/L — ABNORMAL LOW (ref 101–111)
Creatinine, Ser: 1.1 mg/dL — ABNORMAL HIGH (ref 0.44–1.00)
GFR calc Af Amer: 57 mL/min — ABNORMAL LOW (ref >60)
GFR calc non Af Amer: 49 mL/min — ABNORMAL LOW (ref >60)
GLUCOSE: 121 mg/dL — AB (ref 65–99)
Potassium: 4.1 mmol/L (ref 3.5–5.1)
Sodium: 136 mmol/L (ref 135–145)

## 2015-04-16 LAB — GLUCOSE, CAPILLARY: GLUCOSE-CAPILLARY: 96 mg/dL (ref 65–99)

## 2015-04-16 LAB — POCT GLYCOSYLATED HEMOGLOBIN (HGB A1C): Hemoglobin A1C: 6.8

## 2015-04-16 MED ORDER — VERAPAMIL HCL ER 240 MG PO TBCR: 240.0000 mg | EXTENDED_RELEASE_TABLET | Freq: Two times a day (BID) | ORAL | Status: DC

## 2015-04-16 NOTE — Assessment & Plan Note (Addendum)
Lab Results  Component Value Date   HGBA1C 6.8 04/16/2015   HGBA1C 6.4 07/09/2014   HGBA1C 6.6* 03/19/2014     Assessment: Diabetes control:  good control Progress toward A1C goal:   at goal Comments: Currently on Metformin 500 mg BID.   Plan: Medications:  continue current medications Home glucose monitoring: Frequency:   Timing:   Instruction/counseling given: reminded to get eye exam and reminded to bring blood glucose meter & log to each visit Educational resources provided:   Self management tools provided:   Other plans: She reports compliance with her medications. A1c today is 6.7, up form 6.4 in January. Will continue her current medications. Encouraged lifestyle modifications.

## 2015-04-16 NOTE — Patient Instructions (Signed)
Thank you for coming in today  - I am increasing your Verapamil to 240 mg twice a day.  - Continue to work on eating better and losing weight - If you have any problems please call the clinic - I would like to see you back in 1 month for BP recheck

## 2015-04-16 NOTE — Progress Notes (Signed)
Patient ID: RICHIE VADALA, female   DOB: 01-16-1941, 74 y.o.   MRN: [id number]   Subjective:   Patient ID: YARLIN BREISCH female   DOB: Aug 09, 1940 74 y.o.   MRN: [id number]  HPI: Ms.Alexi C Schiraldi is a 74 y.o. female with a past medical history listed below here today for follow up of her HTN and DM.  For details of today's visit and the status of her chronic medical issues please refer to the assessment and plan.  Past Medical History  Diagnosis Date  . Hypertension   . Non-ST elevation myocardial infarction (NSTEMI), initial episode of care (HCC) 05/02/2011  . Hyperlipidemia   . Lumbar disc disease   . Morbid Obesity 05/02/2011  . CAD (coronary artery disease) 05/04/2011  . Diabetes mellitus without complication [facility])    Current Outpatient Prescriptions  Medication Sig Dispense Refill  . albuterol (PROVENTIL HFA;VENTOLIN HFA) 108 (90 BASE) MCG/ACT inhaler Inhale 2 puffs into the lungs every 6 (six) hours as needed for wheezing or shortness of breath. 3 Inhaler 3  . aspirin EC 81 MG tablet Take 1 tablet (81 mg total) by mouth daily. 90 tablet 3  . atenolol (TENORMIN) 50 MG tablet Take 1 tablet (50 mg total) by mouth daily. 90 tablet 3  . atorvastatin (LIPITOR) 40 MG tablet Take 1 tablet (40 mg total) by mouth daily. (Patient not taking: Reported on 03/03/2015) 90 tablet 3  . hydrochlorothiazide (HYDRODIURIL) 25 MG tablet Take 1 tablet (25 mg total) by mouth daily. 90 tablet 3  . ibuprofen (ADVIL,MOTRIN) 200 MG tablet Take 200 mg by mouth every 6 (six) hours as needed for moderate pain.    . metFORMIN (GLUCOPHAGE) 500 MG tablet Take 1 tablet (500 mg total) by mouth 2 (two) times daily with a meal. 180 tablet 3  . spironolactone (ALDACTONE) 25 MG tablet Take 1 tablet (25 mg total) by mouth daily. 90 tablet 3  . verapamil (CALAN-SR) 240 MG CR tablet Take 1 tablet (240 mg total) by mouth 2 (two) times daily. 90 tablet 3   No current facility-administered medications for this visit.    Family History  Problem Relation Age of Onset  . Stroke Mother   . Cancer Father    Social History   Social History  . Marital Status: Married    Spouse Name: N/A  . Number of Children: N/A  . Years of Education: 12   Occupational History  .  Unemployed   Social History Main Topics  . Smoking status: Former Smoker -- 0.50 packs/day for 50 years    Types: Cigarettes    Quit date: 04/01/2011  . Smokeless tobacco: Never Used  . Alcohol Use: No  . Drug Use: No  . Sexual Activity: No   Other Topics Concern  . None   Social History Narrative   June 2016 : married. Lives with husband (blind) and adult daughter. 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 Independent in ADL's. Doesn't go to stores / church 2/2 pain / trouble walking.   Review of Systems: Review of Systems  Constitutional: Negative for fever and chills.  Eyes: Negative for blurred vision and double vision.  Respiratory: Negative for shortness of breath.   Cardiovascular: Negative for chest pain.  Gastrointestinal: Negative for nausea, vomiting and abdominal pain.  Neurological: Negative for dizziness and headaches.   Objective:  Physical Exam: Filed Vitals:   04/16/15 1452  BP: 211/87  Pulse: 65  Temp: 98.2 F (36.8 C)  TempSrc: Oral  Height: 5' 5.5" (1.664 m)  Weight: 333 lb 3.2 oz (151.139 kg)  SpO2: 100%   PHYSICAL EXAM GENERAL- alert, co-operative, appears as stated age, not in any distress. HEENT- Atraumatic, normocephalic, PERRL, EOMI, oral mucosa appears moist CARDIAC- RRR, no murmurs, rubs or gallops. RESP- Moving equal volumes of air, and clear to auscultation bilaterally, no wheezes or crackles. ABDOMEN- Soft, nontender, bowel sounds present. NEURO- No obvious Cr N abnormality. EXTREMITIES- pulse 2+, symmetric, no pedal edema. SKIN- Warm, dry, No rash or lesion. PSYCH- Normal mood and affect, appropriate thought content and speech.  Assessment & Plan:   Case discussed with Dr.  Cyndie ChimeGranfortuna. Please refer to Problem based carting for further details of today's visit.

## 2015-04-19 NOTE — Assessment & Plan Note (Signed)
BP Readings from Last 3 Encounters:  04/16/15 211/87  04/09/15 191/85  03/03/15 174/90    Lab Results  Component Value Date   NA 136 04/16/2015   K 4.1 04/16/2015   CREATININE 1.10* 04/16/2015    Assessment: Blood pressure control:  uncontrolled Comments: Patient is currently on verapamil SR 240 mg daily, atenolol 50 mg daily, HCTZ 25 mg daily. Reports compliance with her medications.  Plan: Medications:  see below Other plans: Patient's HTN is very poorly controlled. [facility] checks show BP is still elevated on their checks (SBP 170-190). Denies any symptoms today. Does report snoring at night as well as apneic episodes. Declines any sleep study due to having to take care of her husband at home. Will increase her verapamil to 480 mg daily. BMP today with no acute abnormalities. RTC in 1 month for recheck.

## 2015-04-19 NOTE — Assessment & Plan Note (Signed)
Filed Weights   04/16/15 1452  Weight: 333 lb 3.2 oz (151.139 kg)   Weight is up 6lbs from previous visit. She is very upset about her weight gain. Does not understand why she continues to gain weight. Discussed her eating habits and she reported to me that she knows she eats a lot of things she should not eat. Reports drinking a pitcher of kool-aid as well as diet soft drinks every day. Also notes a lot of fried foods and potato chips. Reports no exercise. Encouraged lifestyle modifications. She says that she is going to cut out fried foods and the kool aid. Will try to slowly make changes to her dietary habits. She again refused to see Lupita LeashDonna.

## 2015-04-20 NOTE — Progress Notes (Signed)
Medicine attending: I personally interviewed and briefly examined this patient, and reviewed pertinent clinical laboratory and radiographic data  with resident physician Dr Valentino NoseNathan Boswell on the day of the patient visit . and we discussed a  management plan.

## 2015-04-28 ENCOUNTER — Encounter: Payer: Self-pay | Admitting: Student

## 2015-05-11 ENCOUNTER — Other Ambulatory Visit: Payer: Self-pay | Admitting: *Deleted

## 2015-05-11 NOTE — Patient Outreach (Signed)
[facility]) Care Management   05/11/2015  Jamie Mason March 31, 1941 [id number]  Jamie Mason is an 74 y.o. female  Subjective:   Member states that she is "doing all right."  She states that she is disappointed with herself due to her weight gain. Objective:   Review of Systems  Constitutional: Negative.   HENT: Negative.   Eyes: Negative.   Respiratory: Positive for shortness of breath.        With activity  Cardiovascular: Negative.   Gastrointestinal: Negative.   Genitourinary: Negative.   Musculoskeletal: Negative.   Skin: Negative.   Neurological: Negative.   Endo/Heme/Allergies: Negative.   Psychiatric/Behavioral: Negative.     Physical Exam  Constitutional: She is oriented to person, place, and time. She appears well-developed and well-nourished.  Neck: Normal range of motion.  Cardiovascular: Normal rate, regular rhythm and normal heart sounds.   Respiratory: Effort normal and breath sounds normal.  GI: Soft. Bowel sounds are normal.  Musculoskeletal: Normal range of motion.  Neurological: She is alert and oriented to person, place, and time.  Skin: Skin is warm and dry.   BP 193/85 mmHg  Pulse 69  Resp 20  SpO2 98%  Current Medications:   Current Outpatient Prescriptions  Medication Sig Dispense Refill  . albuterol (PROVENTIL HFA;VENTOLIN HFA) 108 (90 BASE) MCG/ACT inhaler Inhale 2 puffs into the lungs every 6 (six) hours as needed for wheezing or shortness of breath. 3 Inhaler 3  . aspirin EC 81 MG tablet Take 1 tablet (81 mg total) by mouth daily. 90 tablet 3  . atenolol (TENORMIN) 50 MG tablet Take 1 tablet (50 mg total) by mouth daily. 90 tablet 3  . atorvastatin (LIPITOR) 40 MG tablet Take 1 tablet (40 mg total) by mouth daily. (Patient not taking: Reported on 03/03/2015) 90 tablet 3  . hydrochlorothiazide (HYDRODIURIL) 25 MG tablet Take 1 tablet (25 mg total) by mouth daily. 90 tablet 3  . ibuprofen (ADVIL,MOTRIN) 200 MG tablet  Take 200 mg by mouth every 6 (six) hours as needed for moderate pain.    . metFORMIN (GLUCOPHAGE) 500 MG tablet Take 1 tablet (500 mg total) by mouth 2 (two) times daily with a meal. 180 tablet 3  . spironolactone (ALDACTONE) 25 MG tablet Take 1 tablet (25 mg total) by mouth daily. 90 tablet 3  . verapamil (CALAN-SR) 240 MG CR tablet Take 1 tablet (240 mg total) by mouth 2 (two) times daily. 90 tablet 3   No current facility-administered medications for this visit.    Functional Status:   In your present state of health, do you have any difficulty performing the following activities: 04/16/2015 12/11/2014  Hearing? N N  Vision? N N  Difficulty concentrating or making decisions? N Y  Walking or climbing stairs? Y Y  Dressing or bathing? N N  Doing errands, shopping? Y Y  Preparing Food and eating ? - -  Using the Toilet? - -  In the past six months, have you accidently leaked urine? - -  Do you have problems with loss of bowel control? - -  Managing your Medications? - -  Managing your Finances? - -  Housekeeping or managing your Housekeeping? - -    Fall/Depression Screening:    PHQ 2/9 Scores 04/16/2015 12/11/2014 11/26/2014 10/22/2014 09/24/2014 08/28/2014 05/30/2014  PHQ - 2 Score 0 0 0 0 0 0 0    Assessment:    Member reports that she has still been struggling to manage her  blood pressure. She states that she was told to increase one of her medications to twice a day, but she was unsure as to which one to increase.  Made aware that she is to increase the Verapamil to twice a day, member shown which medication, explanation provided of how pill looks and how to read the label.  This care manager offered to assist member with setting up a pill box to increase medication compliance, member states that she does not want to use a pill box because it "confuses" her more.  She states that she now understands how to take each medication.  Discussed the importance of medication compliance and  complications of hypertension (stroke, heart attack, etc).  She also is supposed to to be taking Atorvastatin, however she has not completed her Pravastatin yet (she was to be finished by the end of September).  Although pill counts in relation to fill dates on medications indicate noncompliance, member states that she has been taking all of her medications appropriately, with the exception of the Verapamil change.  She states that she will begin taking the Atorvastatin today, instructed to stop taking the Pravastatin.  According to the note from the last office visit, member is to have a follow up visit next month.  She states that she has not made an appointment as of yet.  Internal Medicine office called to schedule appointment, unable to get through.  Member states that she will call later this afternoon or tomorrow morning.  Advised on the importance of a follow up appointment in order to effectively monitor and manage conditions.  She verbalizes understanding.  Member reports having shortness of breath with activity, and she is aware of the need to lose weight.  She states that she does not need the help of a dietician, stating that all she need to do is "cut back" on her meals, which she states she has already done.  Discussed the benefits of collaborating with a dietician, and having one on one assistance with diet, member still states that she does not need the assistance.  She states that she knows that she need to take initiative of managing her health (both weight and blood pressure) in order to be able to continue to care for her disabled husband.  She states that she will begin to use her [employer] and she will be purchasing some exercise equipment that she saw on television.  Diet change (monitoring salt intake, calories, and carbohydrates) discussed.  She verbalizes understanding.  She denies any questions at this time.  Encouraged to contact this care manager with any  concerns.  Plan:   Will notify physician of delay in medication dose change. Routine home visit scheduled for next month.  [facility] CM Care Plan Problem One        Most Recent Value   Care Plan Problem One  hypertension   Role Documenting the Problem One  Care Management Coordinator   Care Plan for Problem One  Active   [facility] Long Term Goal (31-90 days)  Member's blood pressure will be within provided range (from primary physician) within the next 60 days   [facility]N Long Term Goal Start Date  05/11/15 [Goal date reset due to change in medications]   Interventions for Problem One Long Term Goal  Discussed the importance of monitoring blood pressure    [facility] CM Short Term Goal #1 (0-30 days)  Member will take medications as prescribed for the next 4 weeks  [facility] CM Short Term Goal #1 Start Date  05/11/15 [Goal not met, date reset]   Interventions for Short Term Goal #1  Discussed the importance of taking medications as prescribed to manage blood pressure   [facility] CM Short Term Goal #2 (0-30 days)  Member will increase exercise in effort to manage blood pressure   [facility] CM Short Term Goal #2 Start Date  05/11/15 [Goal not met, date reset]   Interventions for Short Term Goal #2  Discussed the effects that weight loss has on overall health status, especially blood pressure   [facility] CM Short Term Goal #3 (0-30 days)  Member will monitor and record blood pressure on a daily basis within the next 4 weeks   [facility] CM Short Term Goal #3 Start Date  05/11/15 [original goal date not met, date reset]   Interventions for Short Tern Goal #3  Discussed the importance of monitoring blood pressure     [facility] CM Care Plan Problem Two        Most Recent Value   Care Plan Problem Two  Weight gain   Role Documenting the Problem Two  Care Management Coordinator   Care Plan for Problem Two  Active   Interventions for Problem Two Long Term Goal   Discussed ways to decrease weight, including exercise and diet   [facility]N Long Term Goal (31-90)  days  Member will report weight loss over the next 90 days   [facility]N Long Term Goal Start Date  05/11/15 [Plan restarted]   [facility] CM Short Term Goal #1 (0-30 days)  Member will weigh self at least once a week for the next 4 weeks to monitor weight gain/loss   [facility] CM Short Term Goal #1 Start Date  05/11/15 [Plan restarted]   Interventions for Short Term Goal #2   Discussed importance of monitoring weight to verify progress   [facility] CM Short Term Goal #2 (0-30 days)  Member will begin using Silver [employer]ne[employer] program to become active within the next 4 weeks, working out at least twice a week   [facility] CM Short Term Goal #2 Start Date  05/11/15 [plan restarted]   Interventions for Short Term Goal #2  Discussed the importance of becoming active and working out in reference to weight loss     Valente David, BSN, [place] Manager [phone]

## 2015-06-10 ENCOUNTER — Ambulatory Visit: Payer: Self-pay | Admitting: *Deleted

## 2015-06-10 ENCOUNTER — Other Ambulatory Visit: Payer: Self-pay | Admitting: *Deleted

## 2015-06-10 NOTE — Patient Outreach (Signed)
Member with routine home visit scheduled for 1pm this afternoon.  Call placed to member to confirm that this appointment remained good for the member, this care manager requested to visit an hour earlier due to a change in schedule.  Member confirms appointment and agrees to earlier time.  Call received back from member prior to arrival for home visit, and she requests for the visit to be rescheduled due to her and her daughter having visitors from out of town that would be with them until tomorrow.  This care manager questioned member about current health status, member states that she has been taking all of her medications appropriately.  She is unable to state what her current blood pressure is, she says "I just know it's ok.  It don't feel like it is up."  She denies making follow up appointment with PCP (was to have appointment made for December, this care manager attempted to make appointment at last visit, member stated that she would do it) and states she will call to schedule. She states that she has been busy with her husband and his appointments and have not taken the time for her own appointments.  Member instructed to monitor blood pressure over the next several days, instructed on the importance of a follow up, and made aware that this care manager will call back to follow up next week.   She denies any urgent concerns at this time, instructed to call with any questions.  Kemper DurieMonica Kien Mirsky, BSN, [facility] Care Management  [facility] Care Manager [phone]

## 2015-06-19 ENCOUNTER — Other Ambulatory Visit: Payer: Self-pay | Admitting: *Deleted

## 2015-06-19 NOTE — Patient Outreach (Signed)
Call placed to member to reschedule home visit that was canceled last week.  Visit rescheduled for 1/24.  Member denies any concerns at this time.  Kemper DurieMonica Josslin Sanjuan, BSN, [facility] Care Management  [facility] Care Manager [phone]

## 2015-07-07 ENCOUNTER — Other Ambulatory Visit: Payer: Self-pay | Admitting: *Deleted

## 2015-07-07 NOTE — Patient Outreach (Signed)
[facility]) Care Management   07/07/2015  ADDISYN LECLAIRE 1940/09/05 [id number]  NAUTICA HOTZ is an 75 y.o. female  Subjective:   Member state that she is "alright."  She denies any pain or discomfort at this time.  She reports taking all medications as prescribed.  Objective:   Review of Systems  Constitutional: Negative.   HENT: Negative.   Eyes: Negative.   Respiratory: Negative.   Cardiovascular: Negative.   Gastrointestinal: Negative.   Genitourinary: Negative.   Musculoskeletal: Negative.   Skin: Negative.   Neurological: Negative.   Endo/Heme/Allergies: Negative.   Psychiatric/Behavioral: Negative.     Physical Exam  Constitutional: She is oriented to person, place, and time. She appears well-developed and well-nourished.  Neck: Normal range of motion.  Cardiovascular: Normal rate, regular rhythm and normal heart sounds.   Respiratory: Effort normal and breath sounds normal.  GI: Soft. Bowel sounds are normal.  Musculoskeletal: Normal range of motion.  Neurological: She is alert and oriented to person, place, and time.  Skin: Skin is warm and dry.    BP 168/84 mmHg  Pulse 88  Resp 18  SpO2 97%   Current Medications:   Current Outpatient Prescriptions  Medication Sig Dispense Refill  . albuterol (PROVENTIL HFA;VENTOLIN HFA) 108 (90 BASE) MCG/ACT inhaler Inhale 2 puffs into the lungs every 6 (six) hours as needed for wheezing or shortness of breath. 3 Inhaler 3  . aspirin EC 81 MG tablet Take 1 tablet (81 mg total) by mouth daily. 90 tablet 3  . atenolol (TENORMIN) 50 MG tablet Take 1 tablet (50 mg total) by mouth daily. 90 tablet 3  . hydrochlorothiazide (HYDRODIURIL) 25 MG tablet Take 1 tablet (25 mg total) by mouth daily. 90 tablet 3  . ibuprofen (ADVIL,MOTRIN) 200 MG tablet Take 200 mg by mouth every 6 (six) hours as needed for moderate pain.    . metFORMIN (GLUCOPHAGE) 500 MG tablet Take 1 tablet (500 mg total) by mouth 2 (two) times  daily with a meal. 180 tablet 3  . spironolactone (ALDACTONE) 25 MG tablet Take 1 tablet (25 mg total) by mouth daily. 90 tablet 3  . verapamil (CALAN-SR) 240 MG CR tablet Take 1 tablet (240 mg total) by mouth 2 (two) times daily. 90 tablet 3  . atorvastatin (LIPITOR) 40 MG tablet Take 1 tablet (40 mg total) by mouth daily. (Patient not taking: Reported on 03/03/2015) 90 tablet 3   No current facility-administered medications for this visit.    Functional Status:   In your present state of health, do you have any difficulty performing the following activities: 04/16/2015 12/11/2014  Hearing? N N  Vision? N N  Difficulty concentrating or making decisions? N Y  Walking or climbing stairs? Y Y  Dressing or bathing? N N  Doing errands, shopping? Y Y  Preparing Food and eating ? - -  Using the Toilet? - -  In the past six months, have you accidently leaked urine? - -  Do you have problems with loss of bowel control? - -  Managing your Medications? - -  Managing your Finances? - -  Housekeeping or managing your Housekeeping? - -    Fall/Depression Screening:    PHQ 2/9 Scores 04/16/2015 12/11/2014 11/26/2014 10/22/2014 09/24/2014 08/28/2014 05/30/2014  PHQ - 2 Score 0 0 0 0 0 0 0    Assessment:    Member still has not started taking Atorvastatin as she has pravastatin left, which indicates she has not been  as compliant with her medications as reported.  She states that she is taking all of her medications as prescribed, however, she still has medications with fill dates of August and September of 2016.  Instructed member to begin taking Atorvastatin immediately in effort to manage her cholesterol.  She verbalizes understanding.  She is aware that she was to have a follow up appointment with the PCP last month to recheck her blood pressure after medication dosage change, but she never scheduled the appointment.  She states that she did increase her Verapamil as instructed, and her blood pressure  today is down.  She has not been monitoring her blood pressure as instructed on a daily basis although she has demonstrated correct usage of the machine.  She also denies making any attempts toward decreasing her weight.  She has not taken advantage of her Silver Sneakers benefit, nor has she changed her diet.  She states that she does not prefer anything grilled, baked, or broiled, and that "fried chicken is my favorite."  This care manager again discussed the possibility to work with a dietician to help manage her diet, she denies the need for one.    Member made aware that her goal of decreasing her blood pressure has been reached, and she denies any other needs at this time.  She is informed that this care manager will follow up with her within the next couple weeks to confirm that her blood pressure remains down, and to follow up on PCP visit.  She is encouraged again to check her blood pressure daily and record readings in her [facility] calendar tool, and to take the readings with her to her visit.  She verbalizes understanding.    Plan:   Will follow up with member in two weeks to check on blood pressure and plan of care with PCP.  If no needs at that time, will close case.  THN CM Care Plan Problem One        Most Recent Value   Care Plan Problem One  hypertension   Role Documenting the Problem One  Care Management Coordinator   Care Plan for Problem One  Active   [facility]N Long Term Goal (31-90 days)  Member's blood pressure will be within provided range (from primary physician) within the next 60 days   [facility]N Long Term Goal Start Date  05/11/15 [Goal date reset due to change in medications]   [facility] Long Term Goal Met Date  07/07/15   Interventions for Problem One Long Term Goal  Discussed the importance of monitoring blood pressure    [facility]N CM Short Term Goal #1 (0-30 days)  Member will take medications as prescribed for the next 4 weeks   [facility]N CM Short Term Goal #1 Start Date  05/11/15 [Goal not met,  date reset]   [facility] CM Short Term Goal #1 Met Date  07/07/15   Interventions for Short Term Goal #1  Discussed the importance of taking medications as prescribed to manage blood pressure   [facility]N CM Short Term Goal #2 (0-30 days)  Member will increase exercise in effort to manage blood pressure   [facility]N CM Short Term Goal #2 Start Date  05/11/15 [Goal not met, date reset]   [facility] CM Short Term Goal #2 Met Date  -- [Goal not met]   Interventions for Short Term Goal #2  Discussed the effects that weight loss has on overall health status, especially blood pressure   [facility]N CM Short Term Goal #3 (  0-30 days)  Member will monitor and record blood pressure on a daily basis within the next 4 weeks   [facility]N CM Short Term Goal #3 Start Date  05/11/15 [original goal date not met, date reset]   [facility] CM Short Term Goal #3 Met Date  -- [Goal not met]   Interventions for Short Tern Goal #3  Discussed the importance of monitoring blood pressure    THN CM Short Term Goal #4 (0-30 days)  Member will maintain decreased blood pressure over the next 2 weeks   [facility]N CM Short Term Goal #4 Start Date  07/07/15   Interventions for Short Term Goal #4  Discussed with member the impact that adhering to a heart health diet and exercise, along with taking medications as prescribed will help manage blood pressure   [facility]N CM Short Term Goal #5 (0-30 days)  Member will attend PCP appointment within the next 2 weeks   [facility]N CM Short Term Goal #5 Start Date  07/07/15   Interventions for Short Term Goal #5  Informed member of the importance of following up with PCP in effort to manage blood pressure and medications    THN CM Care Plan Problem Two        Most Recent Value   Care Plan Problem Two  Weight gain   Role Documenting the Problem Two  Care Management Wayne for Problem Two  Not Active   Interventions for Problem Two Long Term Goal   Discussed ways to decrease weight, including exercise and diet   THN Long Term Goal (31-90)  days  Member will report weight loss over the next 90 days   THN Long Term Goal Start Date  05/11/15 [Plan restarted]   [facility]N Long Term Goal Met Date  -- [Goal not met]   [facility] CM Short Term Goal #1 (0-30 days)  Member will weigh self at least once a week for the next 4 weeks to monitor weight gain/loss   [facility]N CM Short Term Goal #1 Start Date  05/11/15 [Plan restarted]   [facility] CM Short Term Goal #1 Met Date   -- [Goal not met]   Interventions for Short Term Goal #2   Discussed importance of monitoring weight to verify progress   THN CM Short Term Goal #2 (0-30 days)  Member will begin using Silver Sneakers program to become active within the next 4 weeks, working out at least twice a week   [facility]N CM Short Term Goal #2 Start Date  05/11/15 [plan restarted]   [facility]N CM Short Term Goal #2 Met Date  -- [Goal not met]   Interventions for Short Term Goal #2  Discussed the importance of becoming active and working out in reference to [employer] loss     Valente David, BSN, [facility] Manager [phone]

## 2015-07-23 ENCOUNTER — Encounter: Payer: Self-pay | Admitting: Internal Medicine

## 2015-07-23 ENCOUNTER — Ambulatory Visit (INDEPENDENT_AMBULATORY_CARE_PROVIDER_SITE_OTHER): Payer: Commercial Managed Care - HMO | Admitting: Internal Medicine

## 2015-07-23 VITALS — BP 172/63 | HR 99 | Temp 98.4°F | Wt 323.3 lb

## 2015-07-23 DIAGNOSIS — E1122 Type 2 diabetes mellitus with diabetic chronic kidney disease: Secondary | ICD-10-CM

## 2015-07-23 DIAGNOSIS — Z6841 Body Mass Index (BMI) 40.0 and over, adult: Secondary | ICD-10-CM

## 2015-07-23 DIAGNOSIS — I2510 Atherosclerotic heart disease of native coronary artery without angina pectoris: Secondary | ICD-10-CM

## 2015-07-23 DIAGNOSIS — Z7984 Long term (current) use of oral hypoglycemic drugs: Secondary | ICD-10-CM

## 2015-07-23 DIAGNOSIS — I25118 Atherosclerotic heart disease of native coronary artery with other forms of angina pectoris: Secondary | ICD-10-CM

## 2015-07-23 DIAGNOSIS — Z7982 Long term (current) use of aspirin: Secondary | ICD-10-CM

## 2015-07-23 DIAGNOSIS — I129 Hypertensive chronic kidney disease with stage 1 through stage 4 chronic kidney disease, or unspecified chronic kidney disease: Secondary | ICD-10-CM | POA: Diagnosis not present

## 2015-07-23 DIAGNOSIS — I10 Essential (primary) hypertension: Secondary | ICD-10-CM

## 2015-07-23 DIAGNOSIS — E119 Type 2 diabetes mellitus without complications: Secondary | ICD-10-CM

## 2015-07-23 DIAGNOSIS — Z0000 Encounter for general adult medical examination without abnormal findings: Secondary | ICD-10-CM

## 2015-07-23 DIAGNOSIS — Z9114 Patient's other noncompliance with medication regimen: Secondary | ICD-10-CM

## 2015-07-23 DIAGNOSIS — Z23 Encounter for immunization: Secondary | ICD-10-CM

## 2015-07-23 DIAGNOSIS — N182 Chronic kidney disease, stage 2 (mild): Secondary | ICD-10-CM

## 2015-07-23 DIAGNOSIS — R0609 Other forms of dyspnea: Secondary | ICD-10-CM

## 2015-07-23 DIAGNOSIS — E785 Hyperlipidemia, unspecified: Secondary | ICD-10-CM

## 2015-07-23 DIAGNOSIS — Z7189 Other specified counseling: Secondary | ICD-10-CM

## 2015-07-23 LAB — HM DIABETES EYE EXAM

## 2015-07-23 LAB — GLUCOSE, CAPILLARY: GLUCOSE-CAPILLARY: 118 mg/dL — AB (ref 65–99)

## 2015-07-23 LAB — POCT GLYCOSYLATED HEMOGLOBIN (HGB A1C): Hemoglobin A1C: 6.7

## 2015-07-23 MED ORDER — VERAPAMIL HCL ER 240 MG PO TBCR: 240.0000 mg | EXTENDED_RELEASE_TABLET | Freq: Every day | ORAL | Status: DC

## 2015-07-23 NOTE — Patient Instructions (Signed)
1. Enroll your husband and daughter to help you remember to take your blood pressure pills 2. Only take ONE of the pills that doctor asked you to increase to 2 last time you were seen 3. Try gold bond on your R arm 4. Take to your family about what you would want if you are too ill to tell us. 5. Call the hospital operator [phone] after hours, weekends, or holidays if you need to talk to a doctor 6. Come back in 6 weeks to check your blood pressure and your arm

## 2015-07-23 NOTE — Assessment & Plan Note (Addendum)
Supposed to be on statin. Compliance poor. Last LDL 128 in 10/15. No need to check until takes QD.  PLAN : Check FLP once compliant

## 2015-07-23 NOTE — Assessment & Plan Note (Signed)
Lab Results  Component Value Date   HGBA1C 6.7 07/23/2015   A1C remains great on metformin 500 mg BID but ? Med compliance. 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 Jamie Mason checking eyes today. Foot exam today. Flu and PCV 13 today. Congratulated her on A1C - that is what worries her.  PLAN:  Cont current meds

## 2015-07-23 NOTE — Assessment & Plan Note (Signed)
Uses ALB PRN for walks too far and gets DOE.

## 2015-07-23 NOTE — Progress Notes (Signed)
   Subjective:    Patient ID: Jamie Mason, female    DOB: June 07, 1941, 75 y.o.   MRN: [id number]  HPI  Jamie Mason is here for HTN F/U. Please see the A&P for the status of the pt's chronic medical problems.  ROS : per ROS section and in problem oriented charting. All other systems are negative. PMHx, Soc hx, and / or Fam hx : Has $0 co pay on her BP meds but not picking them up (last in Sept 16). Husband blind and insulin dep. Daughter lives with them and drives her to appt. Started using mail order pharmacy.  Review of Systems  Constitutional: Negative for activity change and unexpected weight change.  Respiratory: Positive for shortness of breath. Negative for wheezing.   Cardiovascular: Negative for chest pain and leg swelling.  Musculoskeletal: Positive for back pain and gait problem.  Skin: Positive for rash and wound.  Neurological: Negative for dizziness and light-headedness.       Objective:   Physical Exam  Constitutional: She appears well-developed and well-nourished. No distress.  HENT:  Head: Normocephalic and atraumatic.  Right Ear: External ear normal.  Left Ear: External ear normal.  Nose: Nose normal.  Eyes: Conjunctivae and EOM are normal.  Cardiovascular: Normal rate and regular rhythm.   3/6 systolic murmur at both sternal borders  Pulmonary/Chest: Effort normal and breath sounds normal.  Abdominal: Soft.  Decreased BS  Musculoskeletal: She exhibits edema.  Trace B  Neurological: She is alert.  Skin: Skin is warm and dry. She is not diaphoretic.  R outer upper arm - thickened skin, slightly darkened. Papules throughout, no vesicles. Scratch marks. Skin dry arm and throughout. Slight lichenification legs B  Psychiatric: She has a normal mood and affect. Her behavior is normal. Judgment and thought content normal.          Assessment & Plan:

## 2015-07-23 NOTE — Assessment & Plan Note (Signed)
Wt Readings from Last 3 Encounters:  07/23/15 323 lb 4.8 oz (146.648 kg)  04/16/15 333 lb 3.2 oz (151.139 kg)  04/09/15 326 lb (147.873 kg)   Weight is down a bit. Has cut out the [place] aid. Trying to eat less fried foods. Still drinks soda. Likes store bought chocolate milk - states low fat but the chocolate adds a lot of calories. Sometimes mixes it with white mild which would help. Doesn't drink it QD. Can walk around home but doesn't go out bc can't stand for long 2/2 back pain.  PLAN : cont to educate

## 2015-07-23 NOTE — Assessment & Plan Note (Signed)
She is on BB, ASA, and statin but has poor med compliance. No CP but limited physical activity.  PLAN : work on med compliance

## 2015-07-23 NOTE — Addendum Note (Signed)
Addended by: Kingsley Spittle C on: 07/23/2015 05:00 PM   Modules accepted: Orders

## 2015-07-23 NOTE — Assessment & Plan Note (Signed)
Flu, PVC 13 today. Eye exam Foot exam Talk about other outstanding HM items at future appts

## 2015-07-23 NOTE — Assessment & Plan Note (Addendum)
Dr Selena Batten called pharmacy and picked up Verapamil Jan 24th. HCTZ, Atenolol, and spironolactone in Sept. All meds were $0 co pay. THN had been making home calls but BP elevated there also. Verap had been increased to 240 BID last appt. Pt states takes meds QOD, or every third day. She can't remember to take them - hard to remember. Her husband takes his meds without fail. She is busy in the AM helping her husband,  BP Readings from Last 3 Encounters:  07/07/15 168/84  05/11/15 193/85  04/16/15 211/87   PLAN : Enlist her husband and daughter to remind her to take her meds. Since not taking QD, worry if starts QD will drop too low. To take Verap 240 QD Atenolol 50 QD HCTZ 25 QD Spironolactone 25 QD

## 2015-07-24 ENCOUNTER — Encounter: Payer: Self-pay | Admitting: Dietician

## 2015-07-24 DIAGNOSIS — Z7189 Other specified counseling: Secondary | ICD-10-CM | POA: Insufficient documentation

## 2015-07-24 NOTE — Assessment & Plan Note (Signed)
Pt stated she had never thought about these issues and had not discussed with her family but realized the importance. She knew the term power of attorney but doesn't have one. I offered assistance if she wanted to complete the form. I started with basic scenarios. If she has a reversible condition and the MD's feel that she can recover and return to her prior baseline, then she would have CPR, defib, and intubation. But she stated she would not want to be on a machine for weeks, esp if MD's did not feel that she would recover. I encouraged her to talk with her family  PLAN : cont to address at future appts

## 2015-07-31 ENCOUNTER — Other Ambulatory Visit: Payer: Self-pay | Admitting: *Deleted

## 2015-07-31 NOTE — Patient Outreach (Signed)
Call placed to member to follow up on member's PCP visit last week.  Husband answers the phone and state that she is sleeping.  Request made to have her call this care manager back.  Will await call back.  [name], BSN, [facility] Care Management  [facility] Care Manager [phone]

## 2015-07-31 NOTE — Patient Outreach (Signed)
Call received back from member.  She state that she has been "doing fine."  She reports that her blood pressure was still elevated last week at her PCP office, but her Verapamil was decreased to once a day instead of twice.  Noted that member still struggles with medication management and compliance.  Discussed again ways that member can remember to take her meds, including using a pill box and possibly a medication alarm.  She state that she does not want to use a pill box and that she would like to try keeping all of her pills on the table so that they are visible, which she feel will help with compliance.  Discussed case closure, but will follow up in 2 weeks to assess medication compliance and the possible need for financial assistance to purchase a medication alarm system (if needed).  Member verbalizes understanding.  [name], BSN, [facility] Care Management  [facility] Care Manager [phone]

## 2015-08-12 ENCOUNTER — Encounter: Payer: Self-pay | Admitting: *Deleted

## 2015-08-12 ENCOUNTER — Other Ambulatory Visit: Payer: Self-pay | Admitting: *Deleted

## 2015-08-12 NOTE — Patient Outreach (Signed)
Follow up call placed to member to inquire about medication adherence and blood pressure readings.  She report that she has done better with taking her meds most of the time and that her blood pressures have been "ok" but unable to state a number.  She again denies the need to have the medication alarm to assist with compliance, stating "I don't need that, I got them right here.  I've been doing all right with them."  In the past, she has declined the need for a dietician to discuss diet changes, and she continues to deny the need.  She denies the need for any further assistance.  She is made aware that this care manager will now close case and if there are any concerns or needs that develop in the future she can contact the [facility] main office.  Will notify care management assistant and PCP of case closure.  [name], BSN, [facility] Care Management  [facility] Care Manager [phone]

## 2015-08-14 NOTE — Addendum Note (Signed)
Addended by: Bufford SpikesFULCHER, Elga Santy N on: 08/14/2015 11:18 AM   Modules accepted: Orders

## 2015-08-14 NOTE — Addendum Note (Signed)
Addended by: Bufford SpikesFULCHER, Emmerich Cryer N on: 08/14/2015 11:19 AM   Modules accepted: Orders

## 2015-09-03 ENCOUNTER — Ambulatory Visit: Payer: Commercial Managed Care - HMO | Admitting: Internal Medicine

## 2015-11-12 ENCOUNTER — Other Ambulatory Visit: Payer: Self-pay | Admitting: Internal Medicine

## 2015-11-13 NOTE — Telephone Encounter (Signed)
She had been increased to BID but when I saw her,, she was not taking QD so i altered meds

## 2015-11-13 NOTE — Telephone Encounter (Signed)
I have the Rx as QD not BID. Sent in QD

## 2015-11-16 ENCOUNTER — Telehealth: Payer: Self-pay | Admitting: *Deleted

## 2015-11-16 NOTE — Telephone Encounter (Signed)
[name] called with complaints of lower leg swelling with pitting edema. No redness or pain. Does not weigh regularly. Has tried soaking them in epsom salts and is taking her medication as ordered. Has not tried elevation. Advised patient to elevate legs and see if swelling improves. She wanted to know if she needed to be seen sooner than Thursday when she is scheduled to see you. Please advise.

## 2015-11-16 NOTE — Telephone Encounter (Signed)
Scheduled for 9:15 tomorrow wit Dr. Isabella BowensKrall. And advised patient to go to ED for any shortness of breath, leg pain, signs of decreased circulation in lower extremeties, or worsening symptoms.   FYI

## 2015-11-16 NOTE — Telephone Encounter (Signed)
Needs to be seen today / tomorrow for DVT eval

## 2015-11-17 ENCOUNTER — Ambulatory Visit (INDEPENDENT_AMBULATORY_CARE_PROVIDER_SITE_OTHER): Payer: Commercial Managed Care - HMO | Admitting: Pulmonary Disease

## 2015-11-17 ENCOUNTER — Encounter: Payer: Self-pay | Admitting: Pulmonary Disease

## 2015-11-17 VITALS — BP 154/60 | HR 67 | Temp 98.1°F | Resp 18 | Ht 65.0 in | Wt 329.0 lb

## 2015-11-17 DIAGNOSIS — R600 Localized edema: Secondary | ICD-10-CM | POA: Diagnosis not present

## 2015-11-17 DIAGNOSIS — M722 Plantar fascial fibromatosis: Secondary | ICD-10-CM

## 2015-11-17 DIAGNOSIS — M79672 Pain in left foot: Secondary | ICD-10-CM | POA: Diagnosis not present

## 2015-11-17 DIAGNOSIS — R609 Edema, unspecified: Secondary | ICD-10-CM

## 2015-11-17 DIAGNOSIS — M79671 Pain in right foot: Secondary | ICD-10-CM | POA: Diagnosis not present

## 2015-11-17 NOTE — Progress Notes (Signed)
   Subjective:    Patient ID: Jamie Mason, female    DOB: 02/24/1941, 75 y.o.   MRN: [id number]  HPI [name] is a 75 year old woman with HTN, CAD, HLD, obesity, DM presenting for evaluation of LE edema.  Last echo in 2012 with LV EF 60-65%  Bilateral leg swelling started Saturday. Has had this before and it usually improves with soaking epsom salt. Pain on the bottom of her feet. She cannot explain the type of pain. No paresthesias. Able to walk on her feet but putting pressure on her feet worsens the pain. Has been improving with soaking. No calf pain.  Has not been back to podiatry because she owes them money.  Review of Systems Constitutional: no fevers/chills Respiratory: no shortness of breath Cardiovascular: no chest pain Gastrointestinal: no nausea/vomiting, no abdominal pain, no constipation, no diarrhea Genitourinary: no dysuria, no hematuria  Past Medical History  Diagnosis Date  . Hypertension   . Non-ST elevation myocardial infarction (NSTEMI), initial episode of care (HCC) 05/02/2011  . Hyperlipidemia   . Lumbar disc disease   . Morbid Obesity 05/02/2011  . CAD (coronary artery disease) 05/04/2011  . Diabetes mellitus without complication [facility](HCC)     Current Outpatient Prescriptions on File Prior to Visit  Medication Sig Dispense Refill  . albuterol (PROVENTIL HFA;VENTOLIN HFA) 108 (90 BASE) MCG/ACT inhaler Inhale 2 puffs into the lungs every 6 (six) hours as needed for wheezing or shortness of breath. (Patient not taking: Reported on 07/23/2015) 3 Inhaler 3  . aspirin EC 81 MG tablet Take 1 tablet (81 mg total) by mouth daily. (Patient not taking: Reported on 07/23/2015) 90 tablet 3  . atenolol (TENORMIN) 50 MG tablet Take 1 tablet (50 mg total) by mouth daily. (Patient not taking: Reported on 07/23/2015) 90 tablet 3  . atorvastatin (LIPITOR) 40 MG tablet Take 1 tablet (40 mg total) by mouth daily. (Patient not taking: Reported on 03/03/2015) 90 tablet 3  .  hydrochlorothiazide (HYDRODIURIL) 25 MG tablet Take 1 tablet (25 mg total) by mouth daily. (Patient not taking: Reported on 07/23/2015) 90 tablet 3  . ibuprofen (ADVIL,MOTRIN) 200 MG tablet Take 200 mg by mouth every 6 (six) hours as needed for moderate pain. Reported on 07/23/2015    . metFORMIN (GLUCOPHAGE) 500 MG tablet Take 1 tablet (500 mg total) by mouth 2 (two) times daily with a meal. (Patient not taking: Reported on 07/23/2015) 180 tablet 3  . spironolactone (ALDACTONE) 25 MG tablet Take 1 tablet (25 mg total) by mouth daily. (Patient not taking: Reported on 07/23/2015) 90 tablet 3  . verapamil (CALAN-SR) 240 MG CR tablet Take 1 tablet (240 mg total) by mouth daily. 90 tablet 3   No current facility-administered medications on file prior to visit.      Objective:   Physical Exam Blood pressure 154/60, pulse 67, temperature 98.1 F (36.7 C), temperature source Oral, resp. rate 18, height 5\' 5"  (1.651 m), weight 329 lb (149.233 kg), SpO2 97 %. General Apperance: NAD HEENT: Normocephalic, atraumatic, anicteric sclera Neck: Supple, trachea midline Lungs: Clear to auscultation bilaterally. No wheezes, rhonchi or rales. Breathing comfortably Heart: Regular rate and rhythm, no murmur/rub/gallop Abdomen: Soft, nontender, nondistended, no rebound/guarding Extremities: Warm and well perfused, 1+ pitting edema, tender to deep palpation on plantar surface of foot most anterior to heel Skin: No rashes or lesions Neurologic: Alert and interactive. No gross deficits.    Assessment & Plan:  Please refer to problem based charting.

## 2015-11-17 NOTE — Patient Instructions (Addendum)
What are the symptoms of plantar fasciitis? - The most common symptom is pain under the heel and sole (bottom) of the foot. The pain is often worst when you first get out of bed in the morning. It can also be bad when you get up after being seated for some time.  Is there anything I can do on my own to feel better? - Yes, you can:  ?Rest - Give your foot a chance to heal by resting. But don't completely stop being active. Doing that can lead to more pain and stiffness in the long run.  ?Ice your foot - Putting ice on your heel for 20 minutes up to 4 times a day might relieve pain. Icing and massaging your foot before exercise might also help.  ?Do special foot exercises - Certain exercises can help with heel pain. Do these exercises every day.    ?Take pain medicines - If your pain is severe, you can try taking pain medicines that you can get without a prescription. Examples include Tylenol.   ?Wear sturdy shoes - Sneakers with a lot of cushion and good arch and heel support are best. Shoes with rigid soles can also help. Adding padded or gel heel inserts to your shoes might help, too.  ?Wear splints at night - Some people feel better if they wear a splint while they sleep that keeps their foot straight. These splints are sold in drugstores and medical supply stores.

## 2015-11-18 NOTE — Progress Notes (Signed)
Internal Medicine Clinic Attending  I saw and evaluated the patient.  I personally confirmed the key portions of the history and exam documented by Dr. Krall and I reviewed pertinent patient test results.  The assessment, diagnosis, and plan were formulated together and I agree with the documentation in the resident's note.  

## 2015-11-18 NOTE — Assessment & Plan Note (Signed)
Assessment: Bilateral LE edema to mid calf. Some pitting. Lungs clear and no history of CHF making heart failure unlikely to be the etiology. Possible chronic venous insufficiency. Calf diameter 53-54 cm bilaterally making VTE unlikely.  Plan: Educated patient on leg elevation, decrease static standing. Increase exercise

## 2015-11-18 NOTE — Assessment & Plan Note (Signed)
Assessment: Bilateral plantar pain with pressure and deep palpation. No lesions observed> Likely has plantar fasciitis. Improves with epsom salt soaks.  Plan:  Weight loss Ice Education on exercises/stretches given Tylenol prn pain Educated on proper footwear as well Needs to see podiatry for general foot care given she has DM

## 2015-11-19 ENCOUNTER — Encounter: Payer: Commercial Managed Care - HMO | Admitting: Internal Medicine

## 2015-12-09 DIAGNOSIS — L84 Corns and callosities: Secondary | ICD-10-CM | POA: Diagnosis not present

## 2015-12-09 DIAGNOSIS — E1351 Other specified diabetes mellitus with diabetic peripheral angiopathy without gangrene: Secondary | ICD-10-CM | POA: Diagnosis not present

## 2015-12-09 DIAGNOSIS — M19072 Primary osteoarthritis, left ankle and foot: Secondary | ICD-10-CM | POA: Diagnosis not present

## 2015-12-09 DIAGNOSIS — L602 Onychogryphosis: Secondary | ICD-10-CM | POA: Diagnosis not present

## 2015-12-09 DIAGNOSIS — M216X2 Other acquired deformities of left foot: Secondary | ICD-10-CM | POA: Diagnosis not present

## 2015-12-09 DIAGNOSIS — M216X1 Other acquired deformities of right foot: Secondary | ICD-10-CM | POA: Diagnosis not present

## 2015-12-09 DIAGNOSIS — M19071 Primary osteoarthritis, right ankle and foot: Secondary | ICD-10-CM | POA: Diagnosis not present

## 2015-12-31 ENCOUNTER — Ambulatory Visit (INDEPENDENT_AMBULATORY_CARE_PROVIDER_SITE_OTHER): Payer: Commercial Managed Care - HMO | Admitting: Internal Medicine

## 2015-12-31 ENCOUNTER — Encounter: Payer: Self-pay | Admitting: Internal Medicine

## 2015-12-31 ENCOUNTER — Telehealth: Payer: Self-pay | Admitting: *Deleted

## 2015-12-31 VITALS — BP 182/55 | HR 69 | Temp 97.9°F | Wt 325.0 lb

## 2015-12-31 DIAGNOSIS — R0600 Dyspnea, unspecified: Secondary | ICD-10-CM

## 2015-12-31 DIAGNOSIS — E785 Hyperlipidemia, unspecified: Secondary | ICD-10-CM

## 2015-12-31 DIAGNOSIS — I25118 Atherosclerotic heart disease of native coronary artery with other forms of angina pectoris: Secondary | ICD-10-CM | POA: Diagnosis not present

## 2015-12-31 DIAGNOSIS — E1122 Type 2 diabetes mellitus with diabetic chronic kidney disease: Secondary | ICD-10-CM | POA: Diagnosis not present

## 2015-12-31 DIAGNOSIS — I10 Essential (primary) hypertension: Secondary | ICD-10-CM | POA: Diagnosis not present

## 2015-12-31 DIAGNOSIS — N182 Chronic kidney disease, stage 2 (mild): Secondary | ICD-10-CM

## 2015-12-31 DIAGNOSIS — Z0000 Encounter for general adult medical examination without abnormal findings: Secondary | ICD-10-CM

## 2015-12-31 LAB — GLUCOSE, CAPILLARY: Glucose-Capillary: 130 mg/dL — ABNORMAL HIGH (ref 65–99)

## 2015-12-31 LAB — POCT GLYCOSYLATED HEMOGLOBIN (HGB A1C): Hemoglobin A1C: 6.8

## 2015-12-31 MED ORDER — ALBUTEROL SULFATE HFA 108 (90 BASE) MCG/ACT IN AERS: 2.0000 | INHALATION_SPRAY | Freq: Four times a day (QID) | RESPIRATORY_TRACT | Status: DC | PRN

## 2015-12-31 MED ORDER — FUROSEMIDE 40 MG PO TABS: 40.0000 mg | ORAL_TABLET | Freq: Two times a day (BID) | ORAL | Status: DC

## 2015-12-31 NOTE — Assessment & Plan Note (Signed)
SLEEP Doesn't get to be until late. She states she is refreshed when she wakes up in the AM. Her husband accuses her of snoring. We discussed sleep study - wants to think about bc hard for her to get way.  MMG They hurt her. Last was 2012 and 6 month F/U rec. Pt doesn't want to order it but will think about it.  PAIN / OA She knows her knees and back hurt bc of her weight but her weight and pain make it challenging to walk and lose weight. She doesn't like to leave her house so water aerobics out. We discussed diet. Pharmacy looked into Voltaren gel - 153.81 for three tubes, the generic is not covered at all on her formulary. She had degenerative changes of L knee all way back in 2001. I will broach cymbalta next appt.   DOE She knows this is from her weight. Doesn't occur often. Uses albuterol PRN.

## 2015-12-31 NOTE — Progress Notes (Signed)
Thanks

## 2015-12-31 NOTE — Assessment & Plan Note (Signed)
On metformin 500 BID. No CBG at home. AiC remains great at 6.8. Eye exam in GFeb OK. Checking microalbumin today as mildly elevated and can't use ACE - but changed to verap.  PLAN:  Cont current meds

## 2015-12-31 NOTE — Progress Notes (Signed)
   Subjective:    Patient ID: Jamie Mason, female    DOB: 06/05/1941, 75 y.o.   MRN: [id number]  HPI  Jamie Mason. Please see the A&P for the status of the pt's chronic medical problems.  ROS : per ROS section and in problem oriented charting. All other systems are negative.  PMHx, Soc hx, and / or Fam hx : Cares for husband who is blind. Only leaves home to come to MD appts but OK with that. Daughter runs errands.  Review of Systems  Constitutional: Negative for activity change, appetite change and fatigue.  Respiratory:       + DOE + snoring  Cardiovascular: Positive for leg swelling.  Musculoskeletal: Positive for back pain, arthralgias and gait problem.  Skin: Positive for color change. Negative for pallor.  Psychiatric/Behavioral: Negative for sleep disturbance and dysphoric mood.       Objective:   Physical Exam  Constitutional: She is oriented to person, place, and time. She appears well-developed and well-nourished. No distress.  HENT:  Head: Normocephalic and atraumatic.  Right Ear: External ear normal.  Left Ear: External ear normal.  Nose: Nose normal.  Eyes: Conjunctivae and EOM are normal.  Cardiovascular: Normal rate, regular rhythm and normal heart sounds.  Exam reveals no friction rub.   No murmur heard. Pulmonary/Chest: Effort normal.  Musculoskeletal: She exhibits edema. She exhibits no tenderness.  Neurological: She is alert and oriented to person, place, and time.  Skin: Skin is warm and dry. No rash noted. She is not diaphoretic. No erythema. No pallor.  + 2 pitting edema of LLE Non pitting brawny edema of RLE Increased pigment and lichenification to mid calf R>L No other pitting edema  Psychiatric: She has a normal mood and affect. Her behavior is normal. Judgment and thought content normal.          Assessment & Plan:

## 2015-12-31 NOTE — Patient Instructions (Addendum)
1. Start the new fluid pill called lasix. Take it twice a day. It will help with your blood pressure and maybe with the fluid in your legs. Call us if the medicine causes problems.  2. Try the voltarin gel on your knees for the pain  3. I am sending your inhaler refill to the mail order pharmacy.  4. Try to move around and walk as much as you can  5. Try to lose one or two pounds every week. Try to decrease sodas, sweets, candy, chips, and eat lots of veggies and lean meat and whole grains  6. Come back in one month for a blood pressure check. See me in 3 months  7. Think about a mammogram and a sleep study

## 2015-12-31 NOTE — Assessment & Plan Note (Signed)
She is on BB, ASA, and statin. Limited exertion.   PLAN:  Cont current meds

## 2015-12-31 NOTE — Assessment & Plan Note (Signed)
On atrovo 40 and had the bottle but filled Oct 12th. LDL 128 in 2015. No need to check FLP as not compliant.  PLAN:  Cont current meds

## 2015-12-31 NOTE — Assessment & Plan Note (Signed)
She is on verapamil 240, atonolol 50, HCTZ 25, and spironolactone 25. I had taken her off norvasc when I started the verap (2/2 proteinuria) but she was still taking the norvasc. BP is not controlled. This is partly 2/2 non compliance - her bottle of verap had been filled 06/2015 for a 90 day supply but all others filled in June. She states she is doing better with the compliance and this is true. ACE  / ARB are CI angioedema which required hospitalization.   She c/o LE edema which has not changed. I think most of the fluid is extravascular and not intravascular reflecting chronic venous insuff. Her ECHO is 2012 only showed hypertrophy. Her LFts have been nl. Her TSH 2015 was nl. She only has mild albuminuria (52). Her alb is nl. Even though these were done as late as 5 yrs ago, her edema is not new an hte degree of lichenification indicates it is long standing so there is no reason to repeat these tests.   Even though the fluid is extravascular, I am going to try lasix added to her HCTZ and spironolactone. All work in different mechanisms. The lasix will help her BP (dosing BID) and might help the edema although I cautioned her not to expect too much decreased edema.  PLAN :  Cont current meds Lasix 40 BID Appt one month BP check and BMP

## 2015-12-31 NOTE — Assessment & Plan Note (Signed)
She remains off Kool-aid and is cutting down on her chocolate milk. She has changed some soda intake to bottled water with crystal light. [facility]  PLAN : cont to follow.

## 2015-12-31 NOTE — Progress Notes (Unsigned)
Patient ID: Jamie NighGloria C Mason, female   DOB: 02/08/1941, 75 y.o.   MRN: [id number] Called the patient's mail order pharmacy for co-pay information. Albuterol inhaler is not covered through mail order (part D plan) but should be covered at a community pharmacy under the patient's part B plan. Voltaren gel would cost the patient $153.81 for three tubes, the generic is not covered at all on her formulary. Lasix (20 and 40mg ) would be a $0 co-pay for the patient through her mail order pharmacy. The pharmacy confirmed that amlodipine had already been discontinued but could not confirm when that had been done.

## 2016-01-01 ENCOUNTER — Encounter: Payer: Self-pay | Admitting: Internal Medicine

## 2016-01-01 LAB — MICROALBUMIN / CREATININE URINE RATIO
CREATININE, UR: 103.4 mg/dL
MICROALB/CREAT RATIO: 23.7 mg/g{creat} (ref 0.0–30.0)
Microalbumin, Urine: 24.5 ug/mL

## 2016-01-21 NOTE — Telephone Encounter (Signed)
reviewed

## 2016-02-04 ENCOUNTER — Ambulatory Visit: Payer: Commercial Managed Care - HMO | Admitting: Internal Medicine

## 2016-02-29 ENCOUNTER — Telehealth: Payer: Self-pay | Admitting: Internal Medicine

## 2016-03-09 DIAGNOSIS — I70293 Other atherosclerosis of native arteries of extremities, bilateral legs: Secondary | ICD-10-CM | POA: Diagnosis not present

## 2016-03-09 DIAGNOSIS — E1351 Other specified diabetes mellitus with diabetic peripheral angiopathy without gangrene: Secondary | ICD-10-CM | POA: Diagnosis not present

## 2016-03-09 DIAGNOSIS — M24572 Contracture, left ankle: Secondary | ICD-10-CM | POA: Diagnosis not present

## 2016-03-09 DIAGNOSIS — L602 Onychogryphosis: Secondary | ICD-10-CM | POA: Diagnosis not present

## 2016-03-09 DIAGNOSIS — M24571 Contracture, right ankle: Secondary | ICD-10-CM | POA: Diagnosis not present

## 2016-06-10 ENCOUNTER — Other Ambulatory Visit: Payer: Self-pay | Admitting: Internal Medicine

## 2016-06-10 DIAGNOSIS — E8881 Metabolic syndrome: Secondary | ICD-10-CM

## 2016-06-13 DIAGNOSIS — I5030 Unspecified diastolic (congestive) heart failure: Secondary | ICD-10-CM

## 2016-06-13 HISTORY — DX: Unspecified diastolic (congestive) heart failure: I50.30

## 2016-06-26 ENCOUNTER — Encounter (HOSPITAL_COMMUNITY): Payer: Self-pay

## 2016-06-26 ENCOUNTER — Emergency Department (HOSPITAL_COMMUNITY): Payer: Medicare HMO

## 2016-06-26 ENCOUNTER — Inpatient Hospital Stay (HOSPITAL_COMMUNITY)
Admission: EM | Admit: 2016-06-26 | Discharge: 2016-06-30 | DRG: 871 | Disposition: A | Discharge status: Skilled Nursing Facility | Payer: Medicare HMO | Attending: Internal Medicine | Admitting: Internal Medicine

## 2016-06-26 DIAGNOSIS — R531 Weakness: Secondary | ICD-10-CM | POA: Diagnosis not present

## 2016-06-26 DIAGNOSIS — R269 Unspecified abnormalities of gait and mobility: Secondary | ICD-10-CM | POA: Diagnosis not present

## 2016-06-26 DIAGNOSIS — R062 Wheezing: Secondary | ICD-10-CM | POA: Diagnosis not present

## 2016-06-26 DIAGNOSIS — R8271 Bacteriuria: Secondary | ICD-10-CM | POA: Diagnosis present

## 2016-06-26 DIAGNOSIS — L03115 Cellulitis of right lower limb: Secondary | ICD-10-CM | POA: Diagnosis present

## 2016-06-26 DIAGNOSIS — M7989 Other specified soft tissue disorders: Secondary | ICD-10-CM | POA: Diagnosis not present

## 2016-06-26 DIAGNOSIS — E1169 Type 2 diabetes mellitus with other specified complication: Secondary | ICD-10-CM | POA: Diagnosis present

## 2016-06-26 DIAGNOSIS — Z809 Family history of malignant neoplasm, unspecified: Secondary | ICD-10-CM

## 2016-06-26 DIAGNOSIS — N183 Chronic kidney disease, stage 3 (moderate): Secondary | ICD-10-CM | POA: Diagnosis present

## 2016-06-26 DIAGNOSIS — E785 Hyperlipidemia, unspecified: Secondary | ICD-10-CM | POA: Diagnosis not present

## 2016-06-26 DIAGNOSIS — S81802A Unspecified open wound, left lower leg, initial encounter: Secondary | ICD-10-CM | POA: Diagnosis not present

## 2016-06-26 DIAGNOSIS — Z7982 Long term (current) use of aspirin: Secondary | ICD-10-CM

## 2016-06-26 DIAGNOSIS — E876 Hypokalemia: Secondary | ICD-10-CM | POA: Diagnosis present

## 2016-06-26 DIAGNOSIS — E11622 Type 2 diabetes mellitus with other skin ulcer: Secondary | ICD-10-CM | POA: Diagnosis not present

## 2016-06-26 DIAGNOSIS — L0390 Cellulitis, unspecified: Secondary | ICD-10-CM | POA: Diagnosis not present

## 2016-06-26 DIAGNOSIS — S91301A Unspecified open wound, right foot, initial encounter: Secondary | ICD-10-CM | POA: Diagnosis not present

## 2016-06-26 DIAGNOSIS — S81801D Unspecified open wound, right lower leg, subsequent encounter: Secondary | ICD-10-CM | POA: Diagnosis not present

## 2016-06-26 DIAGNOSIS — S91302A Unspecified open wound, left foot, initial encounter: Secondary | ICD-10-CM | POA: Diagnosis not present

## 2016-06-26 DIAGNOSIS — S81802D Unspecified open wound, left lower leg, subsequent encounter: Secondary | ICD-10-CM | POA: Diagnosis not present

## 2016-06-26 DIAGNOSIS — D649 Anemia, unspecified: Secondary | ICD-10-CM | POA: Diagnosis present

## 2016-06-26 DIAGNOSIS — L280 Lichen simplex chronicus: Secondary | ICD-10-CM | POA: Diagnosis present

## 2016-06-26 DIAGNOSIS — I252 Old myocardial infarction: Secondary | ICD-10-CM

## 2016-06-26 DIAGNOSIS — I25118 Atherosclerotic heart disease of native coronary artery with other forms of angina pectoris: Secondary | ICD-10-CM | POA: Diagnosis not present

## 2016-06-26 DIAGNOSIS — A419 Sepsis, unspecified organism: Secondary | ICD-10-CM | POA: Diagnosis not present

## 2016-06-26 DIAGNOSIS — S91001A Unspecified open wound, right ankle, initial encounter: Secondary | ICD-10-CM | POA: Diagnosis not present

## 2016-06-26 DIAGNOSIS — L97909 Non-pressure chronic ulcer of unspecified part of unspecified lower leg with unspecified severity: Secondary | ICD-10-CM | POA: Diagnosis present

## 2016-06-26 DIAGNOSIS — S81801A Unspecified open wound, right lower leg, initial encounter: Secondary | ICD-10-CM | POA: Diagnosis not present

## 2016-06-26 DIAGNOSIS — I130 Hypertensive heart and chronic kidney disease with heart failure and stage 1 through stage 4 chronic kidney disease, or unspecified chronic kidney disease: Secondary | ICD-10-CM | POA: Diagnosis not present

## 2016-06-26 DIAGNOSIS — I5031 Acute diastolic (congestive) heart failure: Secondary | ICD-10-CM | POA: Diagnosis not present

## 2016-06-26 DIAGNOSIS — E1122 Type 2 diabetes mellitus with diabetic chronic kidney disease: Secondary | ICD-10-CM | POA: Diagnosis not present

## 2016-06-26 DIAGNOSIS — M6281 Muscle weakness (generalized): Secondary | ICD-10-CM | POA: Diagnosis not present

## 2016-06-26 DIAGNOSIS — Z6841 Body Mass Index (BMI) 40.0 and over, adult: Secondary | ICD-10-CM | POA: Diagnosis not present

## 2016-06-26 DIAGNOSIS — M79604 Pain in right leg: Secondary | ICD-10-CM | POA: Diagnosis not present

## 2016-06-26 DIAGNOSIS — S91002A Unspecified open wound, left ankle, initial encounter: Secondary | ICD-10-CM | POA: Diagnosis not present

## 2016-06-26 DIAGNOSIS — Z87891 Personal history of nicotine dependence: Secondary | ICD-10-CM | POA: Diagnosis not present

## 2016-06-26 DIAGNOSIS — Z823 Family history of stroke: Secondary | ICD-10-CM

## 2016-06-26 DIAGNOSIS — I10 Essential (primary) hypertension: Secondary | ICD-10-CM | POA: Diagnosis present

## 2016-06-26 DIAGNOSIS — D638 Anemia in other chronic diseases classified elsewhere: Secondary | ICD-10-CM | POA: Diagnosis present

## 2016-06-26 DIAGNOSIS — M519 Unspecified thoracic, thoracolumbar and lumbosacral intervertebral disc disorder: Secondary | ICD-10-CM | POA: Diagnosis present

## 2016-06-26 DIAGNOSIS — I872 Venous insufficiency (chronic) (peripheral): Secondary | ICD-10-CM | POA: Diagnosis present

## 2016-06-26 DIAGNOSIS — Z79899 Other long term (current) drug therapy: Secondary | ICD-10-CM

## 2016-06-26 DIAGNOSIS — L03119 Cellulitis of unspecified part of limb: Secondary | ICD-10-CM | POA: Diagnosis not present

## 2016-06-26 DIAGNOSIS — R0609 Other forms of dyspnea: Secondary | ICD-10-CM | POA: Diagnosis not present

## 2016-06-26 DIAGNOSIS — L03116 Cellulitis of left lower limb: Secondary | ICD-10-CM | POA: Diagnosis present

## 2016-06-26 DIAGNOSIS — I2510 Atherosclerotic heart disease of native coronary artery without angina pectoris: Secondary | ICD-10-CM | POA: Diagnosis present

## 2016-06-26 DIAGNOSIS — R0600 Dyspnea, unspecified: Secondary | ICD-10-CM | POA: Diagnosis not present

## 2016-06-26 DIAGNOSIS — N182 Chronic kidney disease, stage 2 (mild): Secondary | ICD-10-CM

## 2016-06-26 LAB — BASIC METABOLIC PANEL
Anion gap: 9 (ref 5–15)
BUN: 17 mg/dL (ref 6–20)
CHLORIDE: 102 mmol/L (ref 101–111)
CO2: 29 mmol/L (ref 22–32)
CREATININE: 1.23 mg/dL — AB (ref 0.44–1.00)
Calcium: 8.4 mg/dL — ABNORMAL LOW (ref 8.9–10.3)
GFR calc Af Amer: 49 mL/min — ABNORMAL LOW (ref >60)
GFR calc non Af Amer: 42 mL/min — ABNORMAL LOW (ref >60)
GLUCOSE: 162 mg/dL — AB (ref 65–99)
POTASSIUM: 3.6 mmol/L (ref 3.5–5.1)
Sodium: 140 mmol/L (ref 135–145)

## 2016-06-26 LAB — CBC WITH DIFFERENTIAL/PLATELET
Basophils Absolute: 0 10*3/uL (ref 0.0–0.1)
Basophils Relative: 0 %
EOS ABS: 0.1 10*3/uL (ref 0.0–0.7)
EOS PCT: 1 %
HCT: 28.9 % — ABNORMAL LOW (ref 36.0–46.0)
Hemoglobin: 9.3 g/dL — ABNORMAL LOW (ref 12.0–15.0)
LYMPHS ABS: 2 10*3/uL (ref 0.7–4.0)
LYMPHS PCT: 12 %
MCH: 28.3 pg (ref 26.0–34.0)
MCHC: 32.2 g/dL (ref 30.0–36.0)
MCV: 87.8 fL (ref 78.0–100.0)
MONOS PCT: 8 %
Monocytes Absolute: 1.3 10*3/uL — ABNORMAL HIGH (ref 0.1–1.0)
Neutro Abs: 13.1 10*3/uL — ABNORMAL HIGH (ref 1.7–7.7)
Neutrophils Relative %: 79 %
PLATELETS: 365 10*3/uL (ref 150–400)
RBC: 3.29 MIL/uL — ABNORMAL LOW (ref 3.87–5.11)
RDW: 15.5 % (ref 11.5–15.5)
WBC: 16.5 10*3/uL — AB (ref 4.0–10.5)

## 2016-06-26 LAB — HEPATIC FUNCTION PANEL
ALK PHOS: 62 U/L (ref 38–126)
ALT: 29 U/L (ref 14–54)
AST: 97 U/L — ABNORMAL HIGH (ref 15–41)
Albumin: 2.6 g/dL — ABNORMAL LOW (ref 3.5–5.0)
BILIRUBIN DIRECT: 0.2 mg/dL (ref 0.1–0.5)
BILIRUBIN INDIRECT: 0.5 mg/dL (ref 0.3–0.9)
Total Bilirubin: 0.7 mg/dL (ref 0.3–1.2)
Total Protein: 7.4 g/dL (ref 6.5–8.1)

## 2016-06-26 LAB — GLUCOSE, CAPILLARY: GLUCOSE-CAPILLARY: 107 mg/dL — AB (ref 65–99)

## 2016-06-26 LAB — MAGNESIUM: MAGNESIUM: 1.8 mg/dL (ref 1.7–2.4)

## 2016-06-26 MED ORDER — FUROSEMIDE 40 MG PO TABS: 40.0000 mg | ORAL_TABLET | Freq: Two times a day (BID) | ORAL | Status: DC

## 2016-06-26 MED ORDER — VANCOMYCIN HCL 10 G IV SOLR
1500.0000 mg | Freq: Two times a day (BID) | INTRAVENOUS | Status: DC
  Administered 2016-06-26: 1500 mg via INTRAVENOUS
  Filled 2016-06-26 (×2): qty 1500

## 2016-06-26 MED ORDER — SODIUM CHLORIDE 0.9% FLUSH
3.0000 mL | Freq: Two times a day (BID) | INTRAVENOUS | Status: DC
  Administered 2016-06-27 – 2016-06-29 (×4): 3 mL via INTRAVENOUS

## 2016-06-26 MED ORDER — FAMOTIDINE 20 MG PO TABS
20.0000 mg | ORAL_TABLET | Freq: Two times a day (BID) | ORAL | Status: DC
  Administered 2016-06-26 – 2016-06-30 (×8): 20 mg via ORAL
  Filled 2016-06-26 (×8): qty 1

## 2016-06-26 MED ORDER — VANCOMYCIN HCL IN DEXTROSE 1-5 GM/200ML-% IV SOLN
1000.0000 mg | INTRAVENOUS | Status: DC
  Administered 2016-06-26: 1000 mg via INTRAVENOUS

## 2016-06-26 MED ORDER — ENOXAPARIN SODIUM 80 MG/0.8ML ~~LOC~~ SOLN
70.0000 mg | Freq: Every day | SUBCUTANEOUS | Status: DC
  Administered 2016-06-26 – 2016-06-29 (×4): 70 mg via SUBCUTANEOUS
  Filled 2016-06-26 (×4): qty 0.8

## 2016-06-26 MED ORDER — ONDANSETRON HCL 4 MG PO TABS: 4.0000 mg | ORAL_TABLET | Freq: Four times a day (QID) | ORAL | Status: DC | PRN

## 2016-06-26 MED ORDER — ATORVASTATIN CALCIUM 40 MG PO TABS
40.0000 mg | ORAL_TABLET | Freq: Every day | ORAL | Status: DC
  Administered 2016-06-26 – 2016-06-30 (×5): 40 mg via ORAL
  Filled 2016-06-26 (×5): qty 1

## 2016-06-26 MED ORDER — DEXTROSE 5 % IV SOLN
2.0000 g | Freq: Once | INTRAVENOUS | Status: AC
  Administered 2016-06-26: 2 g via INTRAVENOUS
  Filled 2016-06-26: qty 2

## 2016-06-26 MED ORDER — VANCOMYCIN HCL IN DEXTROSE 1-5 GM/200ML-% IV SOLN
1000.0000 mg | Freq: Once | INTRAVENOUS | Status: DC
  Filled 2016-06-26: qty 200

## 2016-06-26 MED ORDER — SODIUM CHLORIDE 0.9 % IV BOLUS (SEPSIS)
1000.0000 mL | Freq: Once | INTRAVENOUS | Status: AC
  Administered 2016-06-26: 1000 mL via INTRAVENOUS

## 2016-06-26 MED ORDER — DEXTROSE 5 % IV SOLN
1.0000 g | Freq: Two times a day (BID) | INTRAVENOUS | Status: DC
  Filled 2016-06-26: qty 1

## 2016-06-26 MED ORDER — ASPIRIN EC 81 MG PO TBEC
81.0000 mg | DELAYED_RELEASE_TABLET | Freq: Every day | ORAL | Status: DC
  Administered 2016-06-26 – 2016-06-30 (×5): 81 mg via ORAL
  Filled 2016-06-26 (×5): qty 1

## 2016-06-26 MED ORDER — VANCOMYCIN HCL 10 G IV SOLR
1750.0000 mg | INTRAVENOUS | Status: DC
  Administered 2016-06-27: 1750 mg via INTRAVENOUS
  Filled 2016-06-26: qty 1750

## 2016-06-26 MED ORDER — INSULIN ASPART 100 UNIT/ML ~~LOC~~ SOLN
0.0000 [IU] | Freq: Three times a day (TID) | SUBCUTANEOUS | Status: DC
Start: 2016-06-27 — End: 2016-06-30
  Administered 2016-06-27: 4 [IU] via SUBCUTANEOUS
  Administered 2016-06-27 – 2016-06-28 (×2): 3 [IU] via SUBCUTANEOUS
  Administered 2016-06-28: 4 [IU] via SUBCUTANEOUS
  Administered 2016-06-29 – 2016-06-30 (×5): 3 [IU] via SUBCUTANEOUS

## 2016-06-26 MED ORDER — PIPERACILLIN-TAZOBACTAM 3.375 G IVPB: 3.3750 g | Freq: Two times a day (BID) | INTRAVENOUS | Status: DC

## 2016-06-26 MED ORDER — IBUPROFEN 200 MG PO TABS
200.0000 mg | ORAL_TABLET | Freq: Four times a day (QID) | ORAL | Status: DC | PRN
  Administered 2016-06-27 – 2016-06-28 (×2): 200 mg via ORAL
  Filled 2016-06-26 (×2): qty 1

## 2016-06-26 MED ORDER — HYDROCHLOROTHIAZIDE 25 MG PO TABS
25.0000 mg | ORAL_TABLET | Freq: Every day | ORAL | Status: DC
  Administered 2016-06-26 – 2016-06-30 (×5): 25 mg via ORAL
  Filled 2016-06-26 (×5): qty 1

## 2016-06-26 MED ORDER — IPRATROPIUM BROMIDE 0.02 % IN SOLN: 0.5000 mg | Freq: Four times a day (QID) | RESPIRATORY_TRACT | Status: DC | PRN

## 2016-06-26 MED ORDER — CEFEPIME HCL 1 G IJ SOLR: 1.0000 g | Freq: Two times a day (BID) | INTRAMUSCULAR | Status: DC

## 2016-06-26 MED ORDER — ONDANSETRON HCL 4 MG/2ML IJ SOLN: 4.0000 mg | Freq: Four times a day (QID) | INTRAMUSCULAR | Status: DC | PRN

## 2016-06-26 MED ORDER — ENOXAPARIN SODIUM 40 MG/0.4ML ~~LOC~~ SOLN: 40.0000 mg | SUBCUTANEOUS | Status: DC

## 2016-06-26 MED ORDER — CEFEPIME HCL 2 G IJ SOLR: 2.0000 g | Freq: Once | INTRAMUSCULAR | Status: DC

## 2016-06-26 MED ORDER — ALBUTEROL SULFATE (2.5 MG/3ML) 0.083% IN NEBU
2.5000 mg | INHALATION_SOLUTION | Freq: Four times a day (QID) | RESPIRATORY_TRACT | Status: DC | PRN
  Administered 2016-06-27: 2.5 mg via RESPIRATORY_TRACT
  Filled 2016-06-26: qty 3

## 2016-06-26 MED ORDER — VERAPAMIL HCL ER 240 MG PO TBCR
240.0000 mg | EXTENDED_RELEASE_TABLET | Freq: Two times a day (BID) | ORAL | Status: DC
  Administered 2016-06-26 – 2016-06-30 (×8): 240 mg via ORAL
  Filled 2016-06-26 (×9): qty 1

## 2016-06-26 MED ORDER — TRAMADOL HCL 50 MG PO TABS
50.0000 mg | ORAL_TABLET | Freq: Four times a day (QID) | ORAL | Status: DC | PRN
  Administered 2016-06-29 – 2016-06-30 (×2): 50 mg via ORAL
  Filled 2016-06-26 (×3): qty 1

## 2016-06-26 NOTE — Progress Notes (Signed)
Pharmacy Antibiotic Note  Jamie Mason is a 76 y.o. female admitted on 06/26/2016 with mild cellulitis, wounds, notable for pus expression from RLE wound and chronic skin changes.  Pharmacy has been consulted for Vancomycin and Cefepime dosing.  SCr 1.23 CrCl ~ 60 ml/min CG, ~ 46 ml/min N  Plan:  Cefepime 2g IV x1 dose then 1g IV q12h  Vancomycin total loading dose 2500mg  (1500 mg + 1000mg ), then 1750 mg IV q24h.    Measure Vanc trough at steady state.  Follow up renal fxn, culture results, and clinical course.      Temp (24hrs), Avg:97.5 F (36.4 C), Min:97.5 F (36.4 C), Max:97.5 F (36.4 C)  No results for input(s): WBC, CREATININE, LATICACIDVEN, VANCOTROUGH, VANCOPEAK, VANCORANDOM, GENTTROUGH, GENTPEAK, GENTRANDOM, TOBRATROUGH, TOBRAPEAK, TOBRARND, AMIKACINPEAK, AMIKACINTROU, AMIKACIN in the last 168 hours.  CrCl cannot be calculated (Patient's most recent lab result is older than the maximum 21 days allowed.).    Allergies  Allergen Reactions  . Lisinopril Swelling    Tongue swelling   . Ace Inhibitors   . Penicillins Itching and Rash    Has patient had a PCN reaction causing immediate rash, facial/tongue/throat swelling, SOB or lightheadedness with hypotension: unknown Has patient had a PCN reaction causing severe rash involving mucus membranes or skin necrosis: unknown Has patient had a PCN reaction that required hospitalization: no Has patient had a PCN reaction occurring within the last 10 years: no If all of the above answers are "NO", then may proceed with Cephalosporin use.    Antimicrobials this admission: 1/14 Vanc >>  1/14 Cefepime >>   Microbiology results: 1/14 BCx: sent  Thank you for allowing pharmacy to be a part of this patient's care.  Lynann Beaverhristine Keir Foland PharmD, BCPS Pager [phone] 06/26/2016 6:28 PM

## 2016-06-26 NOTE — H&P (Signed)
History and Physical    Jamie Mason:[id number] DOB: [birth year] DOA: 06/26/2016  PCP: Blanch Media, MD   Patient coming from: Home.  Chief Complaint: Leg wounds.  HPI: Jamie Mason is a 76 y.o. female with medical history significant of coronary artery disease, non-STEMI, type 2 diabetes, hyperlipidemia, hypertension, lumbar disc disease, morbid obesity who comes to the emergency department due to chronic lower extremity wounds that she has had for at least 2 months and weakness for the past 3 days.   She provides limited information, but denies history of trauma to the area, fever, chills, but states she feels fatigue. She complains of frequent lower extremity edema. She complains of frequent dyspnea on exertion, occasional chest pressure, but and no episodes recently.   ED Course: The patient received IV fluids, cefepime and vancomycin reporting relief. Her workup showed WBC of 16.5, hemoglobin of 9.3 g/dL, platelets of 130. Sodium was 140, potassium 3.6, chloride 102, CO2 29 mmol/L. Her glucose was 162, BUN 17 and creatinine 1.23 mg/dL. Radiographs of her legs and feet do not show any signs of osteomyelitis.  Review of Systems: As per HPI otherwise 10 point review of systems negative.    Past Medical History:  Diagnosis Date  . CAD (coronary artery disease) 05/04/2011  . Diabetes mellitus without complication (HCC)   . Hyperlipidemia   . Hypertension   . Lumbar disc disease   . Morbid Obesity 05/02/2011  . Non-ST elevation myocardial infarction (NSTEMI), initial episode of care [facility]) 05/02/2011    Past Surgical History:  Procedure Laterality Date  . LEFT HEART CATHETERIZATION WITH CORONARY ANGIOGRAM N/A 05/03/2011   Procedure: LEFT HEART CATHETERIZATION WITH CORONARY ANGIOGRAM;  Surgeon: Othella Boyer, MD;  Location: [facility] CATH LAB;  Service: Cardiovascular;  Laterality: N/A;  . none       reports that she quit smoking about 5 years ago. Her smoking use included  Cigarettes. She has a 25.00 pack-year smoking history. She has never used smokeless tobacco. She reports that she does not drink alcohol or use drugs.  Allergies  Allergen Reactions  . Lisinopril Swelling    Tongue swelling   . Ace Inhibitors   . Penicillins Itching and Rash    Has patient had a PCN reaction causing immediate rash, facial/tongue/throat swelling, SOB or lightheadedness with hypotension: unknown Has patient had a PCN reaction causing severe rash involving mucus membranes or skin necrosis: unknown Has patient had a PCN reaction that required hospitalization: no Has patient had a PCN reaction occurring within the last 10 years: no If all of the above answers are "NO", then may proceed with Cephalosporin use.    Family History  Problem Relation Age of Onset  . Cancer Father   . Stroke Mother     Prior to Admission medications   Medication Sig Start Date End Date Taking? Authorizing Provider  albuterol (PROVENTIL HFA;VENTOLIN HFA) 108 (90 Base) MCG/ACT inhaler Inhale 2 puffs into the lungs every 6 (six) hours as needed for wheezing or shortness of breath. 12/31/15  Yes Burns Spain, MD  ASPIR-LOW 81 MG EC tablet TAKE 1 TABLET EVERY DAY 06/15/16  Yes Burns Spain, MD  atorvastatin (LIPITOR) 40 MG tablet Take 1 tablet (40 mg total) by mouth daily. 02/11/15  Yes Burns Spain, MD  furosemide (LASIX) 40 MG tablet Take 1 tablet (40 mg total) by mouth 2 (two) times daily. 12/31/15  Yes Burns Spain, MD  hydrochlorothiazide (HYDRODIURIL) 25 MG tablet Take 1 tablet (  25 mg total) by mouth daily. 02/11/15  Yes Burns SpainElizabeth A Butcher, MD  ibuprofen (ADVIL,MOTRIN) 200 MG tablet Take 200 mg by mouth every 6 (six) hours as needed for moderate pain. Reported on 07/23/2015   Yes Historical Provider, MD  metFORMIN (GLUCOPHAGE) 500 MG tablet TAKE 1 TABLET (500 MG TOTAL) BY MOUTH 2 (TWO) TIMES DAILY WITH A MEAL. 06/15/16  Yes Burns SpainElizabeth A Butcher, MD  naproxen sodium (ANAPROX) 220  MG tablet Take 220 mg by mouth 2 (two) times daily with a meal.   Yes Historical Provider, MD  verapamil (CALAN-SR) 240 MG CR tablet Take 1 tablet (240 mg total) by mouth daily. Patient taking differently: Take 240 mg by mouth 2 (two) times daily.  11/13/15  Yes Burns SpainElizabeth A Butcher, MD  atenolol (TENORMIN) 50 MG tablet Take 1 tablet (50 mg total) by mouth daily. Patient not taking: Reported on 06/26/2016 02/11/15 02/10/16  Burns SpainElizabeth A Butcher, MD  spironolactone (ALDACTONE) 25 MG tablet Take 1 tablet (25 mg total) by mouth daily. Patient not taking: Reported on 06/26/2016 02/11/15 02/10/16  Burns SpainElizabeth A Butcher, MD    Physical Exam:  Constitutional: NAD, calm, comfortable Vitals:   06/26/16 1544  BP: 154/78  Pulse: 94  Resp: 18  Temp: 97.5 F (36.4 C)  TempSrc: Oral  SpO2: 97%   Eyes: PERRL, lids and conjunctivae normal. No icterus. ENMT: Mucous membranes are moist. Posterior pharynx clear of any exudate or lesions. Partially absent and poor state of repair dentition.  Neck: normal, supple, no masses, no thyromegaly Respiratory: Clear to auscultation bilaterally, no wheezing, no crackles. Normal respiratory effort. No accessory muscle use.  Cardiovascular: Regular rate and rhythm, Occasional extra systoles, no murmurs / rubs / gallops. 1+ lower extremity edema. Positive lymphedema. Faint pedal pulses. No carotid bruits.  Abdomen: Obese, soft, no tenderness, no masses palpated. No hepatosplenomegaly. Bowel sounds positive.  Musculoskeletal: no clubbing / cyanosis.  Good ROM, no contractures. Normal muscle tone.  Skin: Positive ulcers with foul smelling purulent discharge with lichenification of ankle areas. See pictures bellow. Neurologic: CN 2-12 grossly intact. Sensation intact, DTR normal. Generalized weakness. Psychiatric: Alert and oriented x 3. Depressed mood.       Labs on Admission: I have personally reviewed following labs and imaging studies  CBC:  Recent Labs Lab  06/26/16 1836  WBC 16.5*  NEUTROABS 13.1*  HGB 9.3*  HCT 28.9*  MCV 87.8  PLT 365   Basic Metabolic Panel:  Recent Labs Lab 06/26/16 1836  NA 140  K 3.6  CL 102  CO2 29  GLUCOSE 162*  BUN 17  CREATININE 1.23*  CALCIUM 8.4*   GFR: CrCl cannot be calculated (Unknown ideal weight.). Liver Function Tests:  Recent Labs Lab 06/26/16 1836  AST 97*  ALT 29  ALKPHOS 62  BILITOT 0.7  PROT 7.4  ALBUMIN 2.6*   No results for input(s): LIPASE, AMYLASE in the last 168 hours. No results for input(s): AMMONIA in the last 168 hours. Coagulation Profile: No results for input(s): INR, PROTIME in the last 168 hours. Cardiac Enzymes: No results for input(s): CKTOTAL, CKMB, CKMBINDEX, TROPONINI in the last 168 hours. BNP (last 3 results) No results for input(s): PROBNP in the last 8760 hours. HbA1C: No results for input(s): HGBA1C in the last 72 hours. CBG: No results for input(s): GLUCAP in the last 168 hours. Lipid Profile: No results for input(s): CHOL, HDL, LDLCALC, TRIG, CHOLHDL, LDLDIRECT in the last 72 hours. Thyroid Function Tests: No results for input(s): TSH, T4TOTAL,  FREET4, T3FREE, THYROIDAB in the last 72 hours. Anemia Panel: No results for input(s): VITAMINB12, FOLATE, FERRITIN, TIBC, IRON, RETICCTPCT in the last 72 hours. Urine analysis:    Component Value Date/Time   BILIRUBINUR Negative 03/19/2014 1631   PROTEINUR 30 03/19/2014 1631   UROBILINOGEN 0.2 03/19/2014 1631   NITRITE Negative 03/19/2014 1631   LEUKOCYTESUR Negative 03/19/2014 1631    Radiological Exams on Admission: Dg Tibia/fibula Left  Result Date: 06/26/2016 CLINICAL DATA:  bil lower extremity open wounds, flesh degradation, worse distally, pt denies pain, ? Osteomyelitis, denies inj EXAM: LEFT TIBIA AND FIBULA - 2 VIEW COMPARISON:  None. FINDINGS: No fracture. No bone lesion. There are no areas of bone resorption to suggest osteomyelitis. There is marked narrowing of the medial knee joint  space compartment with marginal osteophytes, consistent with osteoarthritis. Ankle joint is normally aligned. There is diffuse subcutaneous soft tissue edema/cellulitis. No soft tissue air. IMPRESSION: 1. No fracture, bone lesion or evidence of osteomyelitis. No soft tissue air. Electronically Signed   By: Amie Portland M.D.   On: 06/26/2016 17:31   Dg Tibia/fibula Right  Result Date: 06/26/2016 CLINICAL DATA:  Open wounds.  Evaluate for osteomyelitis. EXAM: RIGHT TIBIA AND FIBULA - 2 VIEW COMPARISON:  None. FINDINGS: No fracture. No worrisome lytic or sclerotic osseous abnormality. Degenerative changes are noted in the knee. IMPRESSION: Negative. Electronically Signed   By: Kennith Center M.D.   On: 06/26/2016 17:29   Dg Ankle Complete Left  Result Date: 06/26/2016 CLINICAL DATA:  bil lower extremity open wounds, flesh degradation, worse distally, pt denies pain, ? Osteomyelitis, denies inj EXAM: LEFT ANKLE COMPLETE - 3+ VIEW COMPARISON:  None. FINDINGS: No fracture. No bone lesion. There are no areas of bone resorption to suggest osteomyelitis. There are prominent dorsal plantar calcaneal spurs. There is diffuse soft tissue edema/ cellulitis.  No soft tissue air. IMPRESSION: 1. No fracture, dislocation or evidence of osteomyelitis. Electronically Signed   By: Amie Portland M.D.   On: 06/26/2016 17:30   Dg Ankle Complete Right  Result Date: 06/26/2016 CLINICAL DATA:  Open wounds.  Evaluate for osteomyelitis. EXAM: RIGHT ANKLE - COMPLETE 3+ VIEW COMPARISON:  None. FINDINGS: No fracture. No subluxation or dislocation. No gross bony lie cysts in the ankle. Soft tissue edema is noted diffusely. IMPRESSION: No radiographic findings to suggest osteomyelitis. Electronically Signed   By: Kennith Center M.D.   On: 06/26/2016 17:26   Dg Foot Complete Left  Result Date: 06/26/2016 CLINICAL DATA:  bil lower extremity open wounds, flesh degradation, worse distally, pt denies pain, ? Osteomyelitis, denies inj EXAM:  LEFT FOOT - COMPLETE 3+ VIEW COMPARISON:  None. FINDINGS: No fracture. No bone lesion. There are no areas of bone resorption to suggest osteomyelitis. Mild spurring is noted along the dorsal aspect of the midfoot. There is mild asymmetric joint space narrowing of the first metatarsophalangeal joint. These findings are consistent with mild osteoarthritis. There is diffuse soft tissue edema and/or cellulitis. No soft tissue air. There are prominent dorsal plantar calcaneal spurs. IMPRESSION: 1. No fracture, bone lesion or evidence of osteomyelitis. Electronically Signed   By: Amie Portland M.D.   On: 06/26/2016 17:32   Dg Foot Complete Right  Result Date: 06/26/2016 CLINICAL DATA:  Open wounds. EXAM: RIGHT FOOT COMPLETE - 3+ VIEW COMPARISON:  None. FINDINGS: Three views study shows no fracture. No subluxation or dislocation. No definite gross bony destruction to suggest osteomyelitis. IMPRESSION: Negative.MRI without with contrast would be a more sensitive means to  investigate for osteomyelitis, as clinically warranted. Electronically Signed   By: Kennith Center M.D.   On: 06/26/2016 17:28    EKG: Independently reviewed. Vent. rate 101 BPM PR interval * ms QRS duration 98 ms QT/QTc 335/435 ms P-R-T axes 65 67 152 Sinus tachycardia with irregular rate Repol abnrm suggests ischemia, lateral leads Baseline wander in lead(s) V2  Assessment/Plan Principal Problem:   Cellulitis of both lower extremities Admit to telemetry/inpatient. Continue analgesics as needed. Continue IV antibiotics. Follow-up blood cultures and sensitivity. Check ABI when doppler US available in AM. Please consult general surgery in the morning.  Active Problems:   SIRS (systemic inflammatory response syndrome) (HCC) Due to above. Continue careful IV hydration. Continue IV antibiotics. Monitor CBC and CMP daily.    Anemia Check anemia panel Follow-up hematocrit and hemoglobin.    Hypertension Hold  furosemide. Continue hydrochlorothiazide. Continue verapamil 240 mg by mouth twice a day. Monitor her blood pressure periodically.    Hyperlipidemia Continue atorvastatin and monitor LFTs periodically.    Lumbar disc disease Continue tramadol 50 mg by mouth every 6 hours as needed for pain.    CAD (coronary artery disease) Continue aspirin 81 mg by mouth daily. Continue Lipitor 40 mg by mouth daily. Not on a beta blocker. Continue verapamil 240 mg by mouth twice a day.    Dyspnea on exertion Check echocardiogram. Careful IV hydration.    Type 2 diabetes mellitus with stage 2 chronic kidney disease (HCC) Hold metformin. CBG monitoring with regular insulin sliding scale. Check hemoglobin A1c.     DVT prophylaxis: Lovenox. Code Status: Full code. Family Communication: Her daughters were present in the room. Disposition Plan: Admit for IV antibiotic therapy and surgical consult in the morning. Consults called:  Admission status: Inpatient/telemetry.   Bobette Mo MD [facility] Pager [phone].  If 7PM-7AM, please contact night-coverage [email or web address] Password [facility]  06/26/2016, 7:28 PM

## 2016-06-26 NOTE — ED Provider Notes (Signed)
[facility] DEPT Provider Note   CSN: [id number] Arrival date & time: 06/26/16  1538     History   Chief Complaint Chief Complaint  Patient presents with  . Wound Check    HPI Jamie Mason is a 76 y.o. female with pertinent past medical history of type 2 diabetes mellitus, CKD, hypertension, CAD, morbid obesity, hyperlipidemia and chronic venous insufficiency presents to the emergency department with bilateral lower extremity wounds associated with pain, redness, pus drainage, foul smell causing pain with walking. Patient noticed symptoms 1 month ago. Patient reports right heel pain with ambulation. Pt's family members states pt has not walked in three days.  Pt lives with her husband who is blind. Patient denies numbness, tingling, burning and lower extremities. Patient denies fevers, nausea, vomiting, body aches. Patient denies changes in appetite.  Pt states her legs have never looked like this, she has never had lower extremity wounds.   Per chart review patient was last seen by her PCP (dr. Lynnae January) on 12/31/2015 for regular physical exam. On that day Dr. Lynnae January noted patient's lower extremity edema was unchanged and was not new.  Dr. Lynnae January noted lichenification changes in lower extremities and did not repeat testing for this. Pt was started on lasix for BP and to hopefully help with lower extremity edema.   HPI  Past Medical History:  Diagnosis Date  . CAD (coronary artery disease) 05/04/2011  . Diabetes mellitus without complication ([place])   . Hyperlipidemia   . Hypertension   . Lumbar disc disease   . Morbid Obesity 05/02/2011  . Non-ST elevation myocardial infarction (NSTEMI), initial episode of care [facility]) 05/02/2011    Patient Active Problem List   Diagnosis Date Noted  . Cellulitis of both lower extremities 06/26/2016  . Goals of care, counseling/discussion 07/24/2015  . Severe obesity (BMI >= 40) ([place]) 08/28/2014  . Type 2 diabetes mellitus with stage 2  chronic kidney disease ([place]) 04/05/2014  . Dyspnea on exertion 03/19/2014  . History of tobacco use 03/19/2014  . Healthcare maintenance 03/19/2014  . CAD (coronary artery disease) 05/03/2011  . Hypertension 05/02/2011  . Hyperlipidemia 05/02/2011  . Lumbar disc disease 05/02/2011    Past Surgical History:  Procedure Laterality Date  . LEFT HEART CATHETERIZATION WITH CORONARY ANGIOGRAM N/A 05/03/2011   Procedure: LEFT HEART CATHETERIZATION WITH CORONARY ANGIOGRAM;  Surgeon: Jacolyn Reedy, MD;  Location: [facility] CATH LAB;  Service: Cardiovascular;  Laterality: N/A;  . none      OB History    No data available       Home Medications    Prior to Admission medications   Medication Sig Start Date End Date Taking? Authorizing Provider  albuterol (PROVENTIL HFA;VENTOLIN HFA) 108 (90 Base) MCG/ACT inhaler Inhale 2 puffs into the lungs every 6 (six) hours as needed for wheezing or shortness of breath. 12/31/15  Yes Bartholomew Crews, MD  ASPIR-LOW 81 MG EC tablet TAKE 1 TABLET EVERY DAY 06/15/16  Yes Bartholomew Crews, MD  atorvastatin (LIPITOR) 40 MG tablet Take 1 tablet (40 mg total) by mouth daily. 02/11/15  Yes Bartholomew Crews, MD  furosemide (LASIX) 40 MG tablet Take 1 tablet (40 mg total) by mouth 2 (two) times daily. 12/31/15  Yes Bartholomew Crews, MD  hydrochlorothiazide (HYDRODIURIL) 25 MG tablet Take 1 tablet (25 mg total) by mouth daily. 02/11/15  Yes Bartholomew Crews, MD  ibuprofen (ADVIL,MOTRIN) 200 MG tablet Take 200 mg by mouth every 6 (six) hours as needed for  moderate pain. Reported on 07/23/2015   Yes Historical Provider, MD  metFORMIN (GLUCOPHAGE) 500 MG tablet TAKE 1 TABLET (500 MG TOTAL) BY MOUTH 2 (TWO) TIMES DAILY WITH A MEAL. 06/15/16  Yes Bartholomew Crews, MD  naproxen sodium (ANAPROX) 220 MG tablet Take 220 mg by mouth 2 (two) times daily with a meal.   Yes Historical Provider, MD  verapamil (CALAN-SR) 240 MG CR tablet Take 1 tablet (240 mg total) by mouth  daily. Patient taking differently: Take 240 mg by mouth 2 (two) times daily.  11/13/15  Yes Bartholomew Crews, MD  atenolol (TENORMIN) 50 MG tablet Take 1 tablet (50 mg total) by mouth daily. Patient not taking: Reported on 06/26/2016 02/11/15 02/10/16  Bartholomew Crews, MD  spironolactone (ALDACTONE) 25 MG tablet Take 1 tablet (25 mg total) by mouth daily. Patient not taking: Reported on 06/26/2016 02/11/15 02/10/16  Bartholomew Crews, MD    Family History Family History  Problem Relation Age of Onset  . Cancer Father   . Stroke Mother     Social History Social History  Substance Use Topics  . Smoking status: Former Smoker    Packs/day: 0.50    Years: 50.00    Types: Cigarettes    Quit date: 04/01/2011  . Smokeless tobacco: Never Used  . Alcohol use No     Allergies   Lisinopril; Ace inhibitors; and Penicillins   Review of Systems Review of Systems  Constitutional: Negative for appetite change, chills and fever.  HENT: Negative for congestion and sore throat.   Eyes: Negative for visual disturbance.  Respiratory: Negative for cough, choking, chest tightness and shortness of breath.   Cardiovascular: Negative for chest pain, palpitations and leg swelling.  Gastrointestinal: Negative for abdominal pain, constipation, diarrhea, nausea and vomiting.  Endocrine: Negative for polyphagia and polyuria.  Genitourinary: Negative for difficulty urinating, dysuria, frequency, hematuria and pelvic pain.  Musculoskeletal: Positive for arthralgias (R heel), gait problem and joint swelling (ankles).  Skin: Positive for color change and wound. Negative for rash.  Neurological: Negative for dizziness, seizures, syncope, weakness, light-headedness, numbness and headaches.  Hematological: Does not bruise/bleed easily.  Psychiatric/Behavioral: Negative.      Physical Exam Updated Vital Signs BP 102/90   Pulse 100   Temp 97.5 F (36.4 C) (Oral)   Resp 21   Ht 5' 5.5" (1.664 m)    Wt (!) 147.9 kg   SpO2 98%   BMI 53.42 kg/m   Physical Exam  Constitutional: She is oriented to person, place, and time. She appears well-developed and well-nourished. No distress.  Pleasant, obese female.  Foul smell in room. Alert. Oriented to self and place.  HENT:  Head: Normocephalic and atraumatic.  Nose: Nose normal.  Mouth/Throat: Oropharynx is clear and moist. No oropharyngeal exudate.  Poor dentition. Mucous membranes moist.  Eyes: Conjunctivae and EOM are normal. Pupils are equal, round, and reactive to light. No scleral icterus.  Neck: Normal range of motion. Neck supple. No JVD present.  Cardiovascular: Normal rate, regular rhythm, normal heart sounds and intact distal pulses.   No murmur heard. Hypertensive SBP 150s  Pulmonary/Chest: Effort normal and breath sounds normal. No respiratory distress. She has no wheezes. She has no rales. She exhibits no tenderness.  Abdominal: Soft. She exhibits no mass. There is no tenderness. There is no rebound.  Large pannus.   Musculoskeletal: Normal range of motion. She exhibits no deformity.  Full active ROM of knees and ankles without reported pain.  No  bony tenderness over patella, tibia, malleoli of bilateral feet.   Lymphadenopathy:    She has no cervical adenopathy.  Neurological: She is alert and oriented to person, place, and time. No sensory deficit.  Sensation to light touch intact in bilateral lower extremities.  5/5 strength in bilateral ankles.   Skin: Skin is warm and dry. Capillary refill takes less than 2 seconds.  Please see picture of bilateral lower extremity skin wounds. Pt has thickened, dry, flaky and hyperpigmented skin at bilateral lower extremities. There is mild erythema and tenderness surrounding new wounds with purulent discharge.   L popliteal skin is moist and slightly hyperpigmented, no evidence of active wound in popliteal creases bilaterally.   Psychiatric: She has a normal mood and affect. Her  behavior is normal. Judgment and thought content normal.  Nursing note and vitals reviewed.        ED Treatments / Results  Labs (all labs ordered are listed, but only abnormal results are displayed) Labs Reviewed  BASIC METABOLIC PANEL - Abnormal; Notable for the following:       Result Value   Glucose, Bld 162 (*)    Creatinine, Ser 1.23 (*)    Calcium 8.4 (*)    GFR calc non Af Amer 42 (*)    GFR calc Af Amer 49 (*)    All other components within normal limits  CBC WITH DIFFERENTIAL/PLATELET - Abnormal; Notable for the following:    WBC 16.5 (*)    RBC 3.29 (*)    Hemoglobin 9.3 (*)    HCT 28.9 (*)    Neutro Abs 13.1 (*)    Monocytes Absolute 1.3 (*)    All other components within normal limits  HEPATIC FUNCTION PANEL - Abnormal; Notable for the following:    Albumin 2.6 (*)    AST 97 (*)    All other components within normal limits  CULTURE, BLOOD (ROUTINE X 2)  CULTURE, BLOOD (ROUTINE X 2)  MAGNESIUM  URINALYSIS, ROUTINE W REFLEX MICROSCOPIC    EKG  EKG Interpretation None       Radiology Dg Tibia/fibula Left  Result Date: 06/26/2016 CLINICAL DATA:  bil lower extremity open wounds, flesh degradation, worse distally, pt denies pain, ? Osteomyelitis, denies inj EXAM: LEFT TIBIA AND FIBULA - 2 VIEW COMPARISON:  None. FINDINGS: No fracture. No bone lesion. There are no areas of bone resorption to suggest osteomyelitis. There is marked narrowing of the medial knee joint space compartment with marginal osteophytes, consistent with osteoarthritis. Ankle joint is normally aligned. There is diffuse subcutaneous soft tissue edema/cellulitis. No soft tissue air. IMPRESSION: 1. No fracture, bone lesion or evidence of osteomyelitis. No soft tissue air. Electronically Signed   By: Lajean Manes M.D.   On: 06/26/2016 17:31   Dg Tibia/fibula Right  Result Date: 06/26/2016 CLINICAL DATA:  Open wounds.  Evaluate for osteomyelitis. EXAM: RIGHT TIBIA AND FIBULA - 2 VIEW  COMPARISON:  None. FINDINGS: No fracture. No worrisome lytic or sclerotic osseous abnormality. Degenerative changes are noted in the knee. IMPRESSION: Negative. Electronically Signed   By: Misty Stanley M.D.   On: 06/26/2016 17:29   Dg Ankle Complete Left  Result Date: 06/26/2016 CLINICAL DATA:  bil lower extremity open wounds, flesh degradation, worse distally, pt denies pain, ? Osteomyelitis, denies inj EXAM: LEFT ANKLE COMPLETE - 3+ VIEW COMPARISON:  None. FINDINGS: No fracture. No bone lesion. There are no areas of bone resorption to suggest osteomyelitis. There are prominent dorsal plantar calcaneal spurs. There is  diffuse soft tissue edema/ cellulitis.  No soft tissue air. IMPRESSION: 1. No fracture, dislocation or evidence of osteomyelitis. Electronically Signed   By: Lajean Manes M.D.   On: 06/26/2016 17:30   Dg Ankle Complete Right  Result Date: 06/26/2016 CLINICAL DATA:  Open wounds.  Evaluate for osteomyelitis. EXAM: RIGHT ANKLE - COMPLETE 3+ VIEW COMPARISON:  None. FINDINGS: No fracture. No subluxation or dislocation. No gross bony lie cysts in the ankle. Soft tissue edema is noted diffusely. IMPRESSION: No radiographic findings to suggest osteomyelitis. Electronically Signed   By: Misty Stanley M.D.   On: 06/26/2016 17:26   Dg Foot Complete Left  Result Date: 06/26/2016 CLINICAL DATA:  bil lower extremity open wounds, flesh degradation, worse distally, pt denies pain, ? Osteomyelitis, denies inj EXAM: LEFT FOOT - COMPLETE 3+ VIEW COMPARISON:  None. FINDINGS: No fracture. No bone lesion. There are no areas of bone resorption to suggest osteomyelitis. Mild spurring is noted along the dorsal aspect of the midfoot. There is mild asymmetric joint space narrowing of the first metatarsophalangeal joint. These findings are consistent with mild osteoarthritis. There is diffuse soft tissue edema and/or cellulitis. No soft tissue air. There are prominent dorsal plantar calcaneal spurs. IMPRESSION: 1.  No fracture, bone lesion or evidence of osteomyelitis. Electronically Signed   By: Lajean Manes M.D.   On: 06/26/2016 17:32   Dg Foot Complete Right  Result Date: 06/26/2016 CLINICAL DATA:  Open wounds. EXAM: RIGHT FOOT COMPLETE - 3+ VIEW COMPARISON:  None. FINDINGS: Three views study shows no fracture. No subluxation or dislocation. No definite gross bony destruction to suggest osteomyelitis. IMPRESSION: Negative.MRI without with contrast would be a more sensitive means to investigate for osteomyelitis, as clinically warranted. Electronically Signed   By: Misty Stanley M.D.   On: 06/26/2016 17:28    Procedures Procedures (including critical care time)  Medications Ordered in ED Medications  vancomycin (VANCOCIN) 1,500 mg in sodium chloride 0.9 % 500 mL IVPB (1,500 mg Intravenous New Bag/Given 06/26/16 1847)  ceFEPIme (MAXIPIME) 2 g in dextrose 5 % 50 mL IVPB (not administered)  ceFEPIme (MAXIPIME) 1 g in dextrose 5 % 50 mL IVPB (not administered)  vancomycin (VANCOCIN) IVPB 1000 mg/200 mL premix (not administered)  vancomycin (VANCOCIN) 1,750 mg in sodium chloride 0.9 % 500 mL IVPB (not administered)     Initial Impression / Assessment and Plan / ED Course  I have reviewed the triage vital signs and the nursing notes.  Pertinent labs & imaging results that were available during my care of the patient were reviewed by me and considered in my medical decision making (see chart for details).  Clinical Course as of Jun 27 1999  Sun Jun 26, 2016  1941 Elevated AST, normal ALT and Alk phos AST: (!) 97 [CG]  1954 Elevated creatinine from baseline creatinine ~1.1 Creatinine: (!) 1.23 [CG]  1956 Leukocytosis WBC: (!) 16.5 [CG]  1956 Anemia  Hemoglobin: (!) 9.3 [CG]  1956 Hyperglycemia Glucose: (!) 162 [CG]  1956 No signs of osteo in lower extremity imaging, bilaterally DG Foot Complete Right [CG]    Clinical Course User Index [CG] Kinnie Feil, PA-C   76 yo female with pmh of T2DM  (a1c 6.8), CKD, morbid obesity, HTN, hyperlipidemia, CAD and chronic venous insufficiency with bilateral lower extremity wounds x 1 month.  Pt has not been able to ambulate x 3 days due to lower extremity pain with ambulating. Pt afebrile, hypertensive SBP 150s with normal HR and RR. Pt tolerating  PO in ED. Mildly elevated creatinine at 1.23 from baseline ~1.1.  Leukocytosis 16.5. Hyperglycemia 162.  No signs of osteo on lower extremity x-rays (MRI not in house today).  Pt started on vancomycin and cefepime.  Spoke to Dr. Olevia Bowens who will admit pt to inpatient services.  Final Clinical Impressions(s) / ED Diagnoses   Final diagnoses:  None    New Prescriptions New Prescriptions   No medications on file     Arlean Hopping 06/26/16 2001    Virgel Manifold, MD 07/04/16 1433

## 2016-06-26 NOTE — ED Triage Notes (Signed)
She reports superative; purulent, malodorous wounds bilat. Legs. She tells us she is diabetic, and that she has had these wounds "a long time now". EMS report pts. Home is very unkempt and lives with her husband, who is blind. She arrives here in no distress.

## 2016-06-26 NOTE — ED Provider Notes (Signed)
Medical screening examination/treatment/procedure(s) were conducted as a shared visit with non-physician practitioner(s) and myself.  I personally evaluated the patient during the encounter.   EKG Interpretation None      75yF with LE pain. On exam this mostly seems to be chronic skin changes. Lichenification with some areas flaking off. Exposed areas with fairly healthy appearing tissue visible. I was able to express a small amount of pus from the margins of the wound on the RLE though. Maybe some mild cellulitis, but not overly impressive. She is complaining of increased pain though to the point that she hasn't walked in the past three days. I think this is going to be more long term wound care and may need temporary placement, but I think we are going to have to admit her for observation for the time being.    Raeford RazorStephen Amilio Zehnder, MD 06/26/16 1758

## 2016-06-26 NOTE — Progress Notes (Signed)
Rx brief note:  Lovenox Wt=145 kg, CrCl~59 ml/min, BMI=52  Rx adjusted Lovenox to 70 mg sq daily in pt with BMI>30  Thanks Lorenza EvangelistGreen, Divina Neale R 06/26/2016 9:36 PM

## 2016-06-26 NOTE — ED Notes (Signed)
Bed: [id number] Expected date:  Expected time:  Means of arrival:  Comments: 76 yo chronic wounds to lower extremities

## 2016-06-27 ENCOUNTER — Inpatient Hospital Stay (HOSPITAL_COMMUNITY): Payer: Medicare HMO

## 2016-06-27 ENCOUNTER — Encounter (HOSPITAL_COMMUNITY): Payer: Commercial Managed Care - HMO

## 2016-06-27 DIAGNOSIS — M519 Unspecified thoracic, thoracolumbar and lumbosacral intervertebral disc disorder: Secondary | ICD-10-CM

## 2016-06-27 DIAGNOSIS — E1122 Type 2 diabetes mellitus with diabetic chronic kidney disease: Secondary | ICD-10-CM

## 2016-06-27 DIAGNOSIS — L0390 Cellulitis, unspecified: Secondary | ICD-10-CM

## 2016-06-27 DIAGNOSIS — R0609 Other forms of dyspnea: Secondary | ICD-10-CM

## 2016-06-27 DIAGNOSIS — I10 Essential (primary) hypertension: Secondary | ICD-10-CM

## 2016-06-27 DIAGNOSIS — N182 Chronic kidney disease, stage 2 (mild): Secondary | ICD-10-CM

## 2016-06-27 DIAGNOSIS — E785 Hyperlipidemia, unspecified: Secondary | ICD-10-CM

## 2016-06-27 DIAGNOSIS — R0600 Dyspnea, unspecified: Secondary | ICD-10-CM

## 2016-06-27 DIAGNOSIS — I25118 Atherosclerotic heart disease of native coronary artery with other forms of angina pectoris: Secondary | ICD-10-CM

## 2016-06-27 DIAGNOSIS — R6510 Systemic inflammatory response syndrome (SIRS) of non-infectious origin without acute organ dysfunction: Secondary | ICD-10-CM

## 2016-06-27 LAB — CBC WITH DIFFERENTIAL/PLATELET
Basophils Absolute: 0 10*3/uL (ref 0.0–0.1)
Basophils Relative: 0 %
EOS ABS: 0.2 10*3/uL (ref 0.0–0.7)
EOS PCT: 1 %
HCT: 26.4 % — ABNORMAL LOW (ref 36.0–46.0)
HEMOGLOBIN: 8.6 g/dL — AB (ref 12.0–15.0)
LYMPHS PCT: 15 %
Lymphs Abs: 2.5 10*3/uL (ref 0.7–4.0)
MCH: 28.5 pg (ref 26.0–34.0)
MCHC: 32.6 g/dL (ref 30.0–36.0)
MCV: 87.4 fL (ref 78.0–100.0)
MONOS PCT: 8 %
Monocytes Absolute: 1.2 10*3/uL — ABNORMAL HIGH (ref 0.1–1.0)
Neutro Abs: 12.7 10*3/uL — ABNORMAL HIGH (ref 1.7–7.7)
Neutrophils Relative %: 76 %
PLATELETS: 351 10*3/uL (ref 150–400)
RBC: 3.02 MIL/uL — ABNORMAL LOW (ref 3.87–5.11)
RDW: 15.5 % (ref 11.5–15.5)
WBC: 16.6 10*3/uL — ABNORMAL HIGH (ref 4.0–10.5)

## 2016-06-27 LAB — COMPREHENSIVE METABOLIC PANEL
ALBUMIN: 2.2 g/dL — AB (ref 3.5–5.0)
ALT: 25 U/L (ref 14–54)
AST: 74 U/L — AB (ref 15–41)
Alkaline Phosphatase: 53 U/L (ref 38–126)
Anion gap: 8 (ref 5–15)
BUN: 17 mg/dL (ref 6–20)
CHLORIDE: 107 mmol/L (ref 101–111)
CO2: 26 mmol/L (ref 22–32)
CREATININE: 1.07 mg/dL — AB (ref 0.44–1.00)
Calcium: 8.1 mg/dL — ABNORMAL LOW (ref 8.9–10.3)
GFR calc Af Amer: 58 mL/min — ABNORMAL LOW (ref >60)
GFR calc non Af Amer: 50 mL/min — ABNORMAL LOW (ref >60)
Glucose, Bld: 123 mg/dL — ABNORMAL HIGH (ref 65–99)
POTASSIUM: 3.6 mmol/L (ref 3.5–5.1)
SODIUM: 141 mmol/L (ref 135–145)
Total Bilirubin: 0.7 mg/dL (ref 0.3–1.2)
Total Protein: 6.4 g/dL — ABNORMAL LOW (ref 6.5–8.1)

## 2016-06-27 LAB — URINALYSIS, ROUTINE W REFLEX MICROSCOPIC
Bilirubin Urine: NEGATIVE
Glucose, UA: NEGATIVE mg/dL
Ketones, ur: NEGATIVE mg/dL
NITRITE: NEGATIVE
Protein, ur: 30 mg/dL — AB
Specific Gravity, Urine: 1.021 (ref 1.005–1.030)
pH: 5 (ref 5.0–8.0)

## 2016-06-27 LAB — ECHOCARDIOGRAM COMPLETE
Height: 65 in
WEIGHTICAEL: 5121.73 [oz_av]

## 2016-06-27 LAB — GLUCOSE, CAPILLARY
GLUCOSE-CAPILLARY: 168 mg/dL — AB (ref 65–99)
GLUCOSE-CAPILLARY: 147 mg/dL — AB (ref 65–99)
Glucose-Capillary: 147 mg/dL — ABNORMAL HIGH (ref 65–99)

## 2016-06-27 LAB — BRAIN NATRIURETIC PEPTIDE: B Natriuretic Peptide: 1283.8 pg/mL — ABNORMAL HIGH (ref 0.0–100.0)

## 2016-06-27 MED ORDER — HYDROCERIN EX CREA
TOPICAL_CREAM | CUTANEOUS | Status: DC
  Administered 2016-06-27: 11:00:00 via TOPICAL
  Administered 2016-06-30: 1 via TOPICAL
  Filled 2016-06-27: qty 113

## 2016-06-27 MED ORDER — SODIUM CHLORIDE 0.9 % IV SOLN
INTRAVENOUS | Status: DC
  Administered 2016-06-27 (×2): via INTRAVENOUS
  Administered 2016-06-27: 125 mL/h via INTRAVENOUS
  Administered 2016-06-28: 07:00:00 via INTRAVENOUS

## 2016-06-27 MED ORDER — DEXTROSE 5 % IV SOLN
2.0000 g | Freq: Two times a day (BID) | INTRAVENOUS | Status: DC
  Administered 2016-06-27 – 2016-06-28 (×3): 2 g via INTRAVENOUS
  Filled 2016-06-27 (×3): qty 2

## 2016-06-27 MED ORDER — IPRATROPIUM-ALBUTEROL 0.5-2.5 (3) MG/3ML IN SOLN
3.0000 mL | Freq: Three times a day (TID) | RESPIRATORY_TRACT | Status: DC
  Administered 2016-06-27 – 2016-06-30 (×8): 3 mL via RESPIRATORY_TRACT
  Filled 2016-06-27 (×10): qty 3

## 2016-06-27 MED ORDER — IPRATROPIUM-ALBUTEROL 0.5-2.5 (3) MG/3ML IN SOLN
3.0000 mL | Freq: Four times a day (QID) | RESPIRATORY_TRACT | Status: DC | PRN
  Administered 2016-06-27: 3 mL via RESPIRATORY_TRACT
  Filled 2016-06-27: qty 3

## 2016-06-27 MED ORDER — INFLUENZA VAC SPLIT QUAD 0.5 ML IM SUSY
0.5000 mL | PREFILLED_SYRINGE | INTRAMUSCULAR | Status: DC
  Filled 2016-06-27 (×2): qty 0.5

## 2016-06-27 MED ORDER — SODIUM CHLORIDE 0.9 % IV BOLUS (SEPSIS)
1000.0000 mL | Freq: Once | INTRAVENOUS | Status: AC
  Administered 2016-06-27: 1000 mL via INTRAVENOUS

## 2016-06-27 NOTE — Progress Notes (Signed)
  Echocardiogram 2D Echocardiogram has been performed.  Leta JunglingCooper, Anadalay Macdonell M 06/27/2016, 12:23 PM

## 2016-06-27 NOTE — Clinical Social Work Placement (Signed)
[facility] a [facility]. CSW has completed FL2 & will continue to follow and assist with discharge when ready.    Lincoln MaxinKelly Rosealie Reach, LCSW [facility] Clinical Social Worker cell #: [phone]     CLINICAL SOCIAL WORK PLACEMENT  NOTE  Date:  06/27/2016  Patient Details  Name: Jamie Mason MRN: [id number] Date of Birth: 04/12/1941  Clinical Social Work is seeking post-discharge placement for this patient at the Skilled  Nursing Facility level of care (*CSW will initial, date and re-position this form in  chart as items are completed):  Yes   Patient/family provided with [facility] Clinical Social Work Department's list of facilities offering this level of care within the geographic area requested by the patient (or if unable, by the patient's family).  Yes   Patient/family informed of their freedom to choose among providers that offer the needed level of care, that participate in Medicare, Medicaid or managed care program needed by the patient, have an available bed and are willing to accept the patient.  Yes   Patient/family informed of [facility]'s ownership interest in [facility] and [facility], as well as of the fact that they are under no obligation to receive care at these facilities.  PASRR submitted to EDS on 06/27/16     PASRR number received on 06/27/16     Existing PASRR number confirmed on       FL2 transmitted to all facilities in geographic area requested by pt/family on 06/27/16     FL2 transmitted to all facilities within larger geographic area on       Patient informed that his/her managed care company has contracts with or will negotiate with certain facilities, including the following:        Yes   Patient/family informed of bed offers received.  [facility]     Physician recommends and patient chooses bed at      Patient to be [facility] to [facility] on   .  Patient to be transferred to facility by       Patient family notified on   of transfer.  Name of family member notified:        PHYSICIAN       Additional Comment:    _______________________________________________ Arlyss RepressHarrison, Anadia Helmes F, LCSW 06/27/2016, 4:24 PM

## 2016-06-27 NOTE — Progress Notes (Signed)
Pharmacy Antibiotic Note  Jamie NighGloria C Mason is a 76 y.o. female admitted on 06/26/2016 with mild cellulitis, wounds, notable for pus expression from RLE wound and chronic skin changes.  Pharmacy has been consulted for Vancomycin and Cefepime dosing.  06/27/2016:  Scr improved with hydration, NCrCl ~ 2255ml/min  WBC remains elevated, stable  Afebrile  Plan:  Increase Cefepime 2g IV q12h  Continue Vancomycin 1750 mg IV q24h (goal VT 10-1915mcg/ml)    Measure Vanc trough at steady state.  Follow up renal fxn, culture results, and clinical course.  Height: 5\' 5"  (165.1 cm) Weight: (!) 320 lb 1.7 oz (145.2 kg) (with two pillows) IBW/kg (Calculated) : 57  Temp (24hrs), Avg:99.3 F (37.4 C), Min:97.5 F (36.4 C), Max:100.3 F (37.9 C)   Recent Labs Lab 06/26/16 1836 06/27/16 0509  WBC 16.5* 16.6*  CREATININE 1.23* 1.07*    Estimated Creatinine Clearance: 68.2 mL/min (by C-G formula based on SCr of 1.07 mg/dL (H)).    Allergies  Allergen Reactions  . Lisinopril Swelling    Tongue swelling   . Ace Inhibitors   . Penicillins Itching and Rash    Has patient had a PCN reaction causing immediate rash, facial/tongue/throat swelling, SOB or lightheadedness with hypotension: unknown Has patient had a PCN reaction causing severe rash involving mucus membranes or skin necrosis: unknown Has patient had a PCN reaction that required hospitalization: no Has patient had a PCN reaction occurring within the last 10 years: no If all of the above answers are "NO", then may proceed with Cephalosporin use.    Antimicrobials this admission: 1/14 Vanc >>  1/14 Cefepime >>   Microbiology results: 1/14 BCx: sent  Thank you for allowing pharmacy to be a part of this patient's care.  Jamie PushMichelle Avriel Mason, PharmD, BCPS Pager: [phone] 06/27/2016 8:05 AM

## 2016-06-27 NOTE — Progress Notes (Signed)
Pt much improved this AM.  Is stronger and able to assist us to turn and do ADL's.  Only needs 2 assists now.  Discussions with pt this morning reveal she does know she is a diabetic and she ran out of her Metformin "a few weeks ago".  However, she does not have a CBG meter.    She reports that some of the skin sores on her posterior thigh and hip are from a chair she used to have but has gotten rid of it.    She denies any kind of abuse and feels safe at home.    She says her daughter does live with her and the daughter is able to assist her with her care.    Attempts to obtain UA failed again this morning but I believe with pt improvement we should be able to collect it with the next void.

## 2016-06-27 NOTE — Clinical Social Work Note (Signed)
Clinical Social Work Assessment  Patient Details  Name: Jamie Mason MRN: [id number] Date of Birth: 1941-01-02  Date of referral:  06/27/16               Reason for consult:  Facility Placement                Permission sought to share information with:  PCP, Facility Sport and exercise psychologist Permission granted to share information::  Yes, Verbal Permission Granted  Name::        Agency::     Relationship::     Contact Information:     Housing/Transportation Living arrangements for the past 2 months:  Single Family Home Source of Information:  Patient, Spouse, Adult Children Patient Interpreter Needed:  None Criminal Activity/Legal Involvement Pertinent to Current Situation/Hospitalization:  No - Comment as needed Significant Relationships:  Spouse, Siblings, Adult Children Lives with:  Spouse Do you feel safe going back to the place where you live?  No Need for family participation in patient care:  Yes (Comment)  Care giving concerns:  CSW reviewed PT evaluation recommending SNF at [facility].    Social Worker assessment / plan:  CSW met with patient, sister, husband & daughter at bedside re: discharge planning - they are all in agreement with plan for SNF.   Employment status:  Retired Nurse, adult PT Recommendations:  [place] / Referral to community resources:  [place]  Patient/Family's Response to care:  CSW provided SNF bed offers & they accepted bed at [facility]. CSW will submit clinicals to [employer] for authroization.   Patient/Family's Understanding of and Emotional Response to Diagnosis, Current Treatment, and Prognosis:    Emotional Assessment Appearance:  Appears stated age Attitude/Demeanor/Rapport:    Affect (typically observed):    Orientation:  Oriented to Self, Oriented to Place, Oriented to  Time Alcohol / Substance use:    Psych involvement (Current and /or in  the community):     Discharge Needs  Concerns to be addressed:    Readmission within the last 30 days:    Current discharge risk:    Barriers to Discharge:      Standley Brooking, LCSW 06/27/2016, 4:22 PM

## 2016-06-27 NOTE — Progress Notes (Signed)
Upon admission pt has deficit in hygiene.  No family with patient.  Pt is alert and oriented and states she lives with her blind husband and daugther.  She reports she normally ambulates with a cane (as recently as 2 days ago), however she has multiple partial to full thickness/MASD wounds to gluteal fold, ischial tuberosity, posterior thighs, hip in addition to her venous stasis ulcers on her BLEs.    She is unable/unwilling to move in bed, turn or do ADL's.  This #320 pt requires 3 person assist to turn in bed etc.   Is mostly incontinent and attempts to use BP have failed. Attempts to collect the ordered UA have failed.  HR is ST at times as high as 145 but does not sustain greater than 120.    Pt denies history of diabetes, does not have a CBG meter and denies having an order for Metformin (or tablet or any kind of DM medication)  However, her PN/PTA med list state pt has HX of non compliant DM and is on Metformin.    No family with pt to clarify.  Pt is able to answer orientation questions.

## 2016-06-27 NOTE — NC FL2 (Signed)
[place] MEDICAID FL2 LEVEL OF CARE SCREENING TOOL     IDENTIFICATION  Patient Name: Jamie Mason Birthdate: 02/09/1941 Sex: female Admission Date (Current Location): 06/26/2016  [facility] [place]   Facility and Address:  [facility],  [address], [place] [id number]      Provider Number: [id number]  Attending Physician Name and Address:  Edsel PetrinMaryann Mikhail, DO  Relative Name and Phone Number:       Current Level of Care: Hospital Recommended Level of Care: Skilled Nursing Facility Prior Approval Number:    Date Approved/Denied:   PASRR Number: [id number]  Discharge Plan: SNF    Current Diagnoses: Patient Active Problem List   Diagnosis Date Noted  . Cellulitis of both lower extremities 06/26/2016  . SIRS (systemic inflammatory response syndrome) (HCC) 06/26/2016  . Goals of care, counseling/discussion 07/24/2015  . Severe obesity (BMI >= 40) (HCC) 08/28/2014  . 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 Dyspnea on exertion 03/19/2014  . History of tobacco use 03/19/2014  . Healthcare maintenance 03/19/2014  . CAD (coronary artery disease) 05/03/2011  . Hypertension 05/02/2011  . Hyperlipidemia 05/02/2011  . Lumbar disc disease 05/02/2011    Orientation RESPIRATION BLADDER Height & Weight     Self, Time, Situation, Place  Normal Incontinent Weight: (!) 320 lb 1.7 oz (145.2 kg) (with two pillows) Height:  5\' 5"  (165.1 cm)  BEHAVIORAL SYMPTOMS/MOOD NEUROLOGICAL BOWEL NUTRITION STATUS      Continent Diet (Heart Healthy/Carb Modified)  AMBULATORY STATUS COMMUNICATION OF NEEDS Skin   Extensive Assist Verbally  (Wound / Incision (Open or Dehisced) 06/26/16 Other (Comment) Other (Comment) Right;Left;Anterior;Posterior multiple scattered partial/full thickness, and MASD)                       Personal Care Assistance Level of Assistance  Bathing, Dressing Bathing Assistance: Limited  assistance   Dressing Assistance: Limited assistance     Functional Limitations Info             SPECIAL CARE FACTORS FREQUENCY  PT (By licensed PT), OT (By licensed OT)     PT Frequency: 5 OT Frequency: 5            Contractures      Additional Factors Info  Code Status, Allergies Code Status Info: Fullcode Allergies Info: Lisinopril, Ace Inhibitors, Penicillins           Current Medications (06/27/2016):  This is the current hospital active medication list Current Facility-Administered Medications  Medication Dose Route Frequency Provider Last Rate Last Dose  . 0.9 %  sodium chloride infusion   Intravenous Continuous Bobette Moavid Manuel Ortiz, MD 125 mL/hr at 06/27/16 0425 125 mL/hr at 06/27/16 0425  . aspirin EC tablet 81 mg  81 mg Oral Daily Bobette Moavid Manuel Ortiz, MD   81 mg at 06/26/16 2237  . atorvastatin (LIPITOR) tablet 40 mg  40 mg Oral q1800 Bobette Moavid Manuel Ortiz, MD   40 mg at 06/26/16 2237  . ceFEPIme (MAXIPIME) 2 g in dextrose 5 % 50 mL IVPB  2 g Intravenous Q12H Loleta RoseAndrea M Lilliston, RPH      . enoxaparin (LOVENOX) injection 70 mg  70 mg Subcutaneous QHS Bobette Moavid Manuel Ortiz, MD   70 mg at 06/26/16 2237  . famotidine (PEPCID) tablet 20 mg  20 mg Oral BID Bobette Moavid Manuel Ortiz, MD   20 mg at 06/26/16 2237  . hydrocerin (EUCERIN) cream  Topical Once per day on Mon Thu Maryann Mikhail, DO      . hydrochlorothiazide (HYDRODIURIL) tablet 25 mg  25 mg Oral Daily Bobette Mo, MD   25 mg at 06/26/16 2237  . ibuprofen (ADVIL,MOTRIN) tablet 200 mg  200 mg Oral Q6H PRN Bobette Mo, MD      . Melene Muller ON 06/28/2016] Influenza vac split quadrivalent PF (FLUARIX) injection 0.5 mL  0.5 mL Intramuscular Tomorrow-1000 Bobette Mo, MD      . insulin aspart (novoLOG) injection 0-20 Units  0-20 Units Subcutaneous TID WC Bobette Mo, MD      . ipratropium-albuterol (DUONEB) 0.5-2.5 (3) MG/3ML nebulizer solution 3 mL  3 mL Nebulization Q6H PRN Maryann Mikhail, DO      .  ondansetron (ZOFRAN) tablet 4 mg  4 mg Oral Q6H PRN Bobette Mo, MD       Or  . ondansetron [facility]) injection 4 mg  4 mg Intravenous Q6H PRN Bobette Mo, MD      . sodium chloride flush (NS) 0.9 % injection 3 mL  3 mL Intravenous Q12H Bobette Mo, MD      . traMADol Janean Sark) tablet 50 mg  50 mg Oral Q6H PRN Bobette Mo, MD      . vancomycin (VANCOCIN) 1,750 mg in sodium chloride 0.9 % 500 mL IVPB  1,750 mg Intravenous Q24H Winfield Rast, RPH      . vancomycin (VANCOCIN) IVPB 1000 mg/200 mL premix  1,000 mg Intravenous Once Bobette Mo, MD      . verapamil (CALAN-SR) CR tablet 240 mg  240 mg Oral BID Bobette Mo, MD   240 mg at 06/26/16 2238     Discharge Medications: Please see discharge summary for a list of discharge medications.  Relevant Imaging Results:  Relevant Lab Results:   Additional Information SSN: [id number]  Arlyss Repress, LCSW

## 2016-06-27 NOTE — Progress Notes (Signed)
VASCULAR LAB PRELIMINARY  ARTERIAL  ABI completed:Unable to ascertain ABIs secondary to large, purulent open wounds on bilateral legs, as well as woody edema throughout lower legs and feet. Bilateral dorsalis pedis waveforms are monophasic.  Unable to Doppler bilateral posterior tibial arteries secondary to reasons mentioned above.    RIGHT    LEFT    PRESSURE WAVEFORM  PRESSURE WAVEFORM  BRACHIAL 120 B BRACHIAL 120 B  DP  M DP  M  AT   AT    PT Inaudible  PT Inaudible   PER   PER    GREAT TOE  NA GREAT TOE  NA    RIGHT LEFT  ABI       Jamie Mason, RVT 06/27/2016, 11:52 AM

## 2016-06-27 NOTE — Progress Notes (Addendum)
PROGRESS NOTE    Jamie Mason  ZOX:[id number] DOB: 02-06-1941 DOA: 06/26/2016 PCP: Blanch Media, MD   Chief Complaint  Patient presents with  . Wound Check     Brief Narrative:  HPI on 06/26/2016 by Dr. Sanda Klein Jamie Mason is a 76 y.o. female with medical history significant of coronary artery disease, non-STEMI, type 2 diabetes, hyperlipidemia, hypertension, lumbar disc disease, morbid obesity who comes to the emergency department due to chronic lower extremity wounds that she has had for at least 2 months and weakness for the past 3 days.  She provides limited information, but denies history of trauma to the area, fever, chills, but states she feels fatigue. She complains of frequent lower extremity edema. She complains of frequent dyspnea on exertion, occasional chest pressure, but and no episodes recently.  Assessment & Plan   Sepsis secondary to lower extremity cellulitis -Presented with leukocytosis, tachycardia, tachypnea -Lower extremity x-rays unremarkable for fracture or osteomyelitis -Wound care consulted and recommended home health, UNNA boots -Placed on empiric and a biopsy vancomycin and cefepime -PT consult recommended SNF -ABI: Unable to ascertain ABIs due to large open wounds  Dyspnea on exertion -Echocardiogram pending -Possibly likely to body habitus/morbid obesity  Asymptomatic bacteruria -UA showed few bacteria, TNTC WBC, large leukocytes -Patient currently asymptomatic and not complaining of any pyuria or dysuria  Normocytic Anemia -Hemoglobin currently 8.9, was noted to be 11 in 2015, 13 in 2016 -Will obtain anemia panel -continue to monitor CBC  Essential hypertension -Continue verapamil, HCTZ  Hyperlipidemia -Continue statin  Lumbar disc disease -Continue pain control as needed  CAD -Continue aspirin, statin -Denies chest pain  Diabetes mellitus, type II -Metformin held -Continue insulin by scale CBG monitoring -Hemoglobin  A1c pending  Chronic kidney disease, stage III -Creatinine currently stable, continue to monitor BMP  Morbid obesity -BMI 53.4 -patient will need to follow up with her PCP to discuss lifestyle modifications  DVT Prophylaxis  Lovenox  Code Status: Full  Family Communication: None at bedside  Disposition Plan: Admitted. Pending wound care consult, PT, improvement in patient's symptoms  Consultants None  Procedures  Echocardiogram  Antibiotics   Anti-infectives    Start     Dose/Rate Route Frequency Ordered Stop   06/27/16 2200  vancomycin (VANCOCIN) 1,750 mg in sodium chloride 0.9 % 500 mL IVPB     1,750 mg 250 mL/hr over 120 Minutes Intravenous Every 24 hours 06/26/16 2001     06/27/16 1000  ceFEPIme (MAXIPIME) 1 g in dextrose 5 % 50 mL IVPB  Status:  Discontinued     1 g 100 mL/hr over 30 Minutes Intravenous Every 12 hours 06/26/16 2001 06/27/16 0805   06/27/16 1000  ceFEPIme (MAXIPIME) 2 g in dextrose 5 % 50 mL IVPB     2 g 100 mL/hr over 30 Minutes Intravenous Every 12 hours 06/27/16 0805     06/26/16 2200  ceFEPIme (MAXIPIME) 1 g in dextrose 5 % 50 mL IVPB  Status:  Discontinued     1 g 100 mL/hr over 30 Minutes Intravenous Every 12 hours 06/26/16 1830 06/26/16 1841   06/26/16 2200  piperacillin-tazobactam (ZOSYN) IVPB 3.375 g  Status:  Discontinued     3.375 g 12.5 mL/hr over 240 Minutes Intravenous Every 12 hours 06/26/16 1830 06/26/16 1841   06/26/16 2200  ceFEPIme (MAXIPIME) 2 g in dextrose 5 % 50 mL IVPB     2 g 100 mL/hr over 30 Minutes Intravenous  Once 06/26/16 2127 06/26/16 2309  06/26/16 2200  vancomycin (VANCOCIN) IVPB 1000 mg/200 mL premix     1,000 mg 200 mL/hr over 60 Minutes Intravenous  Once 06/26/16 2128     06/26/16 2015  vancomycin (VANCOCIN) IVPB 1000 mg/200 mL premix  Status:  Discontinued     1,000 mg 200 mL/hr over 60 Minutes Intravenous NOW 06/26/16 2001 06/26/16 2340   06/26/16 1845  ceFEPIme (MAXIPIME) 2 g in dextrose 5 % 50 mL IVPB   Status:  Discontinued     2 g 100 mL/hr over 30 Minutes Intravenous  Once 06/26/16 1843 06/26/16 2126   06/26/16 1830  vancomycin (VANCOCIN) 1,500 mg in sodium chloride 0.9 % 500 mL IVPB  Status:  Discontinued     1,500 mg 250 mL/hr over 120 Minutes Intravenous Every 12 hours 06/26/16 1804 06/26/16 2129      Subjective:   Jamie Mason seen and examined today.  Patient cannot tell me when these wounds on her legs appeared.  Denies chest pain, shortness of breath, abdominal pain, N/V/D/C.   Objective:   Vitals:   06/27/16 0424 06/27/16 0637 06/27/16 1000 06/27/16 1024  BP:  120/79    Pulse: (!) 107 (!) 135 (!) 144 (!) 103  Resp:  18 (!) 36 (!) 22  Temp:  99.8 F (37.7 C)    TempSrc:  Oral    SpO2:  94% 90% 94%  Weight:      Height:        Intake/Output Summary (Last 24 hours) at 06/27/16 1414 Last data filed at 06/27/16 1200  Gross per 24 hour  Intake          3860.83 ml  Output              200 ml  Net          3660.83 ml   Filed Weights   06/26/16 1945 06/26/16 2107  Weight: (!) 147.9 kg (326 lb) (!) 145.2 kg (320 lb 1.7 oz)    Exam  General: Well developed, well nourished, NAD, appears stated age  HEENT: NCAT, mucous membranes moist.   Cardiovascular: S1 S2 auscultated, no rubs, murmurs or gallops. Regular rate and rhythm.  Respiratory: Clear to auscultation bilaterally with equal chest rise  Abdomen: Soft, obesity, nontender, nondistended, + bowel sounds  Extremities: warm dry without cyanosis clubbing. Trace edema, multiple wounds on LE B/L- no purulence   Skin: chronic venous stasis skin changes on LE   Neuro: AAOx3, nonfocal  Psych: Normal affect and demeanor   Data Reviewed: I have personally reviewed following labs and imaging studies  CBC:  Recent Labs Lab 06/26/16 1836 06/27/16 0509  WBC 16.5* 16.6*  NEUTROABS 13.1* 12.7*  HGB 9.3* 8.6*  HCT 28.9* 26.4*  MCV 87.8 87.4  PLT 365 351   Basic Metabolic Panel:  Recent Labs Lab  06/26/16 1836 06/27/16 0509  NA 140 141  K 3.6 3.6  CL 102 107  CO2 29 26  GLUCOSE 162* 123*  BUN 17 17  CREATININE 1.23* 1.07*  CALCIUM 8.4* 8.1*  MG 1.8  --    GFR: Estimated Creatinine Clearance: 68.2 mL/min (by C-G formula based on SCr of 1.07 mg/dL (H)). Liver Function Tests:  Recent Labs Lab 06/26/16 1836 06/27/16 0509  AST 97* 74*  ALT 29 25  ALKPHOS 62 53  BILITOT 0.7 0.7  PROT 7.4 6.4*  ALBUMIN 2.6* 2.2*   No results for input(s): LIPASE, AMYLASE in the last 168 hours. No results for input(s): AMMONIA in  the last 168 hours. Coagulation Profile: No results for input(s): INR, PROTIME in the last 168 hours. Cardiac Enzymes: No results for input(s): CKTOTAL, CKMB, CKMBINDEX, TROPONINI in the last 168 hours. BNP (last 3 results) No results for input(s): PROBNP in the last 8760 hours. HbA1C: No results for input(s): HGBA1C in the last 72 hours. CBG:  Recent Labs Lab 06/26/16 2204 06/27/16 1202  GLUCAP 107* 147*   Lipid Profile: No results for input(s): CHOL, HDL, LDLCALC, TRIG, CHOLHDL, LDLDIRECT in the last 72 hours. Thyroid Function Tests: No results for input(s): TSH, T4TOTAL, FREET4, T3FREE, THYROIDAB in the last 72 hours. Anemia Panel: No results for input(s): VITAMINB12, FOLATE, FERRITIN, TIBC, IRON, RETICCTPCT in the last 72 hours. Urine analysis:    Component Value Date/Time   COLORURINE YELLOW 06/27/2016 1221   APPEARANCEUR CLOUDY (A) 06/27/2016 1221   LABSPEC 1.021 06/27/2016 1221   PHURINE 5.0 06/27/2016 1221   GLUCOSEU NEGATIVE 06/27/2016 1221   HGBUR SMALL (A) 06/27/2016 1221   BILIRUBINUR NEGATIVE 06/27/2016 1221   BILIRUBINUR Negative 03/19/2014 1631   KETONESUR NEGATIVE 06/27/2016 1221   PROTEINUR 30 (A) 06/27/2016 1221   UROBILINOGEN 0.2 03/19/2014 1631   NITRITE NEGATIVE 06/27/2016 1221   LEUKOCYTESUR LARGE (A) 06/27/2016 1221   Sepsis Labs: @LABRCNTIP (procalcitonin:4,lacticidven:4)  ) Recent Results (from the past 240  hour(s))  Blood culture (routine x 2)     Status: None (Preliminary result)   Collection Time: 06/26/16  6:36 PM  Result Value Ref Range Status   Specimen Description BLOOD RIGHT HAND  Final   Special Requests BOTTLES DRAWN AEROBIC AND ANAEROBIC 5CC  Final   Culture PENDING  Incomplete   Report Status PENDING  Incomplete      Radiology Studies: Dg Tibia/fibula Left  Result Date: 06/26/2016 CLINICAL DATA:  bil lower extremity open wounds, flesh degradation, worse distally, pt denies pain, ? Osteomyelitis, denies inj EXAM: LEFT TIBIA AND FIBULA - 2 VIEW COMPARISON:  None. FINDINGS: No fracture. No bone lesion. There are no areas of bone resorption to suggest osteomyelitis. There is marked narrowing of the medial knee joint space compartment with marginal osteophytes, consistent with osteoarthritis. Ankle joint is normally aligned. There is diffuse subcutaneous soft tissue edema/cellulitis. No soft tissue air. IMPRESSION: 1. No fracture, bone lesion or evidence of osteomyelitis. No soft tissue air. Electronically Signed   By: Amie Portland M.D.   On: 06/26/2016 17:31   Dg Tibia/fibula Right  Result Date: 06/26/2016 CLINICAL DATA:  Open wounds.  Evaluate for osteomyelitis. EXAM: RIGHT TIBIA AND FIBULA - 2 VIEW COMPARISON:  None. FINDINGS: No fracture. No worrisome lytic or sclerotic osseous abnormality. Degenerative changes are noted in the knee. IMPRESSION: Negative. Electronically Signed   By: Kennith Center M.D.   On: 06/26/2016 17:29   Dg Ankle Complete Left  Result Date: 06/26/2016 CLINICAL DATA:  bil lower extremity open wounds, flesh degradation, worse distally, pt denies pain, ? Osteomyelitis, denies inj EXAM: LEFT ANKLE COMPLETE - 3+ VIEW COMPARISON:  None. FINDINGS: No fracture. No bone lesion. There are no areas of bone resorption to suggest osteomyelitis. There are prominent dorsal plantar calcaneal spurs. There is diffuse soft tissue edema/ cellulitis.  No soft tissue air. IMPRESSION:  1. No fracture, dislocation or evidence of osteomyelitis. Electronically Signed   By: Amie Portland M.D.   On: 06/26/2016 17:30   Dg Ankle Complete Right  Result Date: 06/26/2016 CLINICAL DATA:  Open wounds.  Evaluate for osteomyelitis. EXAM: RIGHT ANKLE - COMPLETE 3+ VIEW COMPARISON:  None.  FINDINGS: No fracture. No subluxation or dislocation. No gross bony lie cysts in the ankle. Soft tissue edema is noted diffusely. IMPRESSION: No radiographic findings to suggest osteomyelitis. Electronically Signed   By: Kennith Center M.D.   On: 06/26/2016 17:26   Dg Foot Complete Left  Result Date: 06/26/2016 CLINICAL DATA:  bil lower extremity open wounds, flesh degradation, worse distally, pt denies pain, ? Osteomyelitis, denies inj EXAM: LEFT FOOT - COMPLETE 3+ VIEW COMPARISON:  None. FINDINGS: No fracture. No bone lesion. There are no areas of bone resorption to suggest osteomyelitis. Mild spurring is noted along the dorsal aspect of the midfoot. There is mild asymmetric joint space narrowing of the first metatarsophalangeal joint. These findings are consistent with mild osteoarthritis. There is diffuse soft tissue edema and/or cellulitis. No soft tissue air. There are prominent dorsal plantar calcaneal spurs. IMPRESSION: 1. No fracture, bone lesion or evidence of osteomyelitis. Electronically Signed   By: Amie Portland M.D.   On: 06/26/2016 17:32   Dg Foot Complete Right  Result Date: 06/26/2016 CLINICAL DATA:  Open wounds. EXAM: RIGHT FOOT COMPLETE - 3+ VIEW COMPARISON:  None. FINDINGS: Three views study shows no fracture. No subluxation or dislocation. No definite gross bony destruction to suggest osteomyelitis. IMPRESSION: Negative.MRI without with contrast would be a more sensitive means to investigate for osteomyelitis, as clinically warranted. Electronically Signed   By: Kennith Center M.D.   On: 06/26/2016 17:28     Scheduled Meds: . aspirin EC  81 mg Oral Daily  . atorvastatin  40 mg Oral q1800  .  ceFEPime (MAXIPIME) IV  2 g Intravenous Q12H  . enoxaparin (LOVENOX) injection  70 mg Subcutaneous QHS  . famotidine  20 mg Oral BID  . hydrocerin   Topical Once per day on Mon Thu  . hydrochlorothiazide  25 mg Oral Daily  . [START ON 06/28/2016] Influenza vac split quadrivalent PF  0.5 mL Intramuscular Tomorrow-1000  . insulin aspart  0-20 Units Subcutaneous TID WC  . sodium chloride flush  3 mL Intravenous Q12H  . vancomycin  1,750 mg Intravenous Q24H  . vancomycin  1,000 mg Intravenous Once  . verapamil  240 mg Oral BID   Continuous Infusions: . sodium chloride 125 mL/hr at 06/27/16 1116     LOS: 1 day   Time Spent in minutes   30 minutes  Burnette Valenti D.O. on 06/27/2016 at 2:14 PM  Between 7am to 7pm - Pager - [phone]  After 7pm go to [email or web address] - password [email or web address]  And look for the night coverage person covering for me after hours  [facility] Office  [phone]

## 2016-06-27 NOTE — Progress Notes (Signed)
NUTRITION NOTE  During rounds this AM, Dr. Catha GosselinMikhail requested RD to see pt. Will see pt tomorrow (06/28/16).    Trenton GammonJessica Annice Jolly, MS, RD, LDN, [facility] Inpatient Clinical Dietitian Pager # [phone] After hours/weekend pager # [phone]

## 2016-06-27 NOTE — Progress Notes (Signed)
Skin assessment performed by 2 RN's on admission and revealed multiple ares of partial, full thickness wounds influenced by MASD and pressure in gluteal fold, L and r ischial tuberosity, posterior thighs, r hip many hidden in skin folds.  Areas cleaned and barrier cream applied.  Then to moist skin folds under breasts, back folds and abd to areas of possible yeast microguard powder placed.  Education provided to pt re pressure wounds and prevention and skin fold protection.  Pt verbalizes understanding.  Will need reinforcement.  Will turn pt q2, provide skin protection and float heels.

## 2016-06-27 NOTE — Consult Note (Signed)
WOC Nurse wound consult note Reason for Consult:Bilateral LEs with venous leg ulcerations, R>L.  (Right = Lateral, Left = Medial). 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 Wound type:venous insufficiency Pressure Injury POA: No Measurement:left medial malleolus:  2.5cm x 5cm x 0.1cm.  Irregular wound edges, shallow, moist, red with dried serum indicative of small amount of serous exudate has resolved with elevation. Right lateral malleolus: 5.8cm x 7cm x 0.1cm with irregular, shallow wound base, yellow slough obscures 30% of wound. 70% of wound bed is pink, moist.  Scant amount of serosanguinous exudate on bed pad, no fresh exudate since LEs have been elevated.   Wound bed:As described above Drainage (amount, consistency, odor) As described above Periwound:Bilateral LEs with edema, patient reports it is less than upon admission since she has been in bed with LEs elevated. Dressing procedure/placement/frequency: I will implement a POC aimed at cleansing and moisturizing the bilateral LEs and then dressing wounds with an absorptive product (calcium alginate) topped with a silicone foam.  Following this, we will ask Ortho Tech to apply bilateral Unna's Boots and have scheduled changes for twice weekly.  Patient will require either the services of an [facility] or referral to the outpatient wound care center of her choosing for follow up changes and maintainence POC (typically compression hosiery) following discharge from acute care.  If you agree, please order/arrange/consult with Case Management for ongoing care. Thank you for inviting us to see this nice patient. WOC nursing team will not follow, but will remain available to this patient, the nursing and medical teams.  Please re-consult if needed. Thanks, Ladona MowLaurie Merland Holness, MSN, RN, GNP, Hans EdenCWOCN, CWON-AP, FAAN  Pager# [phone]

## 2016-06-27 NOTE — Evaluation (Addendum)
Physical Therapy Evaluation Patient Details Name: Jamie Mason MRN: [id number] DOB: 02/11/1941 Today's Date: 06/27/2016   History of Present Illness  76 y.o. female with medical history significant of coronary artery disease, non-STEMI, type 2 diabetes, hyperlipidemia, hypertension, lumbar disc disease, morbid obesity who comes to the emergency department due to chronic lower extremity wounds that she has had for at least 2 months and weakness for the past 3 days.   Clinical Impression  Pt admitted with above diagnosis. Pt currently with functional limitations due to the deficits listed below (see PT Problem List). Mod assist supine to sit, max assist to attempt to stand with RW, pt unable to come to full stand. HR 144 max with activity, respiratory rate 36 with activity (respiratory therapy called for tx), SaO2 90% RA.  Pt will benefit from skilled PT to increase their independence and safety with mobility to allow discharge to the venue listed below.       Follow Up Recommendations SNF    Equipment Recommendations  Wheelchair (measurements PT);Rolling walker with 5" wheels    Recommendations for Other Services       Precautions / Restrictions Precautions Precautions: Fall Precaution Comments: fell out of recliner while sleeping a few weeks ago, no other falls in past year Restrictions Weight Bearing Restrictions: No      Mobility  Bed Mobility Overal bed mobility: Needs Assistance Bed Mobility: Supine to Sit     Supine to sit: Mod assist;HOB elevated     General bed mobility comments: mod assist to raise trunk, used rail, SOB with supine to sit (resp rate 36, SaO2 90% RA, HR 122-144, RN aware, respiratory called for breathing tx)  Transfers Overall transfer level: Needs assistance Equipment used: Rolling walker (2 wheeled) Transfers: Sit to/from Stand Sit to Stand: Max assist         General transfer comment: unable to achieve full standing position with max  assist, cleared buttocks off of bed to flexed standing position briefly then needed to sit  Ambulation/Gait                Stairs            Wheelchair Mobility    Modified Rankin (Stroke Patients Only)       Balance Overall balance assessment: Needs assistance Sitting-balance support: Feet supported;Single extremity supported Sitting balance-Leahy Scale: Fair       Standing balance-Leahy Scale: Zero Standing balance comment: unable to fully stand                             Pertinent Vitals/Pain Pain Assessment: No/denies pain    Home Living Family/patient expects to be discharged to:: Private residence Living Arrangements: Spouse/significant other;Children Available Help at Discharge: Family;Available 24 hours/day   Home Access: Stairs to enter Entrance Stairs-Rails: Right Entrance Stairs-Number of Steps: 2 Home Layout: One level Home Equipment: Cane - single point Additional Comments: husband is blind    Prior Function Level of Independence: Independent with assistive device(s)         Comments: walks with cane, sleeps in recliner, sponge bathes     Hand Dominance        Extremity/Trunk Assessment   Upper Extremity Assessment Upper Extremity Assessment: Overall WFL for tasks assessed    Lower Extremity Assessment Lower Extremity Assessment: Overall WFL for tasks assessed (knee ext +4/5 B, ankle AROM WFL)    Cervical / Trunk Assessment Cervical / Trunk Assessment:  Normal  Communication   Communication: No difficulties  Cognition Arousal/Alertness: Awake/alert Behavior During Therapy: WFL for tasks assessed/performed Overall Cognitive Status: Within Functional Limits for tasks assessed                      General Comments General comments (skin integrity, edema, etc.): open draining wound R lateral lower leg, non draining wound L lateral leg, wound nurse following    Exercises     Assessment/Plan    PT  Assessment Patient needs continued PT services  PT Problem List Obesity;Decreased mobility;Decreased activity tolerance;Decreased balance;Decreased skin integrity          PT Treatment Interventions Gait training;DME instruction;Functional mobility training;Balance training;Therapeutic exercise;Therapeutic activities;Patient/family education    PT Goals (Current goals can be found in the Care Plan section)  Acute Rehab PT Goals Patient Stated Goal: to return home PT Goal Formulation: With patient Time For Goal Achievement: 07/11/16 Potential to Achieve Goals: Good    Frequency Min 3X/week   Barriers to discharge        Co-evaluation               End of Session Equipment Utilized During Treatment: Gait belt Activity Tolerance: Patient limited by fatigue;Treatment limited secondary to medical complications (Comment) (HR 144, resp rate 36 with activity) Patient left: in bed;with call bell/phone within reach;with bed alarm set Nurse Communication: Mobility status;Need for lift equipment         Time: [phone] PT Time Calculation (min) (ACUTE ONLY): 28 min   Charges:   PT Evaluation $PT Eval Moderate Complexity: 1 Procedure PT Treatments $Therapeutic Activity: 8-22 mins   PT G Codes:        Tamala Ser 06/27/2016, 10:17 AM [phone]

## 2016-06-28 LAB — CBC WITH DIFFERENTIAL/PLATELET
BASOS ABS: 0 10*3/uL (ref 0.0–0.1)
Basophils Relative: 0 %
EOS PCT: 3 %
Eosinophils Absolute: 0.5 10*3/uL (ref 0.0–0.7)
HCT: 24.6 % — ABNORMAL LOW (ref 36.0–46.0)
HEMOGLOBIN: 8.2 g/dL — AB (ref 12.0–15.0)
LYMPHS PCT: 24 %
Lymphs Abs: 3.9 10*3/uL (ref 0.7–4.0)
MCH: 29.1 pg (ref 26.0–34.0)
MCHC: 33.3 g/dL (ref 30.0–36.0)
MCV: 87.2 fL (ref 78.0–100.0)
Monocytes Absolute: 1.2 10*3/uL — ABNORMAL HIGH (ref 0.1–1.0)
Monocytes Relative: 7 %
NEUTROS PCT: 66 %
Neutro Abs: 10.7 10*3/uL — ABNORMAL HIGH (ref 1.7–7.7)
PLATELETS: 345 10*3/uL (ref 150–400)
RBC: 2.82 MIL/uL — AB (ref 3.87–5.11)
RDW: 15.5 % (ref 11.5–15.5)
WBC: 16.3 10*3/uL — AB (ref 4.0–10.5)

## 2016-06-28 LAB — FERRITIN: Ferritin: 227 ng/mL (ref 11–307)

## 2016-06-28 LAB — IRON AND TIBC
Iron: 26 ug/dL — ABNORMAL LOW (ref 28–170)
SATURATION RATIOS: 13 % (ref 10.4–31.8)
TIBC: 197 ug/dL — AB (ref 250–450)
UIBC: 171 ug/dL

## 2016-06-28 LAB — GLUCOSE, CAPILLARY
GLUCOSE-CAPILLARY: 164 mg/dL — AB (ref 65–99)
GLUCOSE-CAPILLARY: 147 mg/dL — AB (ref 65–99)
Glucose-Capillary: 109 mg/dL — ABNORMAL HIGH (ref 65–99)
Glucose-Capillary: 149 mg/dL — ABNORMAL HIGH (ref 65–99)

## 2016-06-28 LAB — BASIC METABOLIC PANEL
ANION GAP: 10 (ref 5–15)
BUN: 21 mg/dL — ABNORMAL HIGH (ref 6–20)
CO2: 23 mmol/L (ref 22–32)
Calcium: 8 mg/dL — ABNORMAL LOW (ref 8.9–10.3)
Chloride: 107 mmol/L (ref 101–111)
Creatinine, Ser: 1.33 mg/dL — ABNORMAL HIGH (ref 0.44–1.00)
GFR calc Af Amer: 45 mL/min — ABNORMAL LOW (ref >60)
GFR, EST NON AFRICAN AMERICAN: 39 mL/min — AB (ref >60)
Glucose, Bld: 120 mg/dL — ABNORMAL HIGH (ref 65–99)
POTASSIUM: 3.4 mmol/L — AB (ref 3.5–5.1)
SODIUM: 140 mmol/L (ref 135–145)

## 2016-06-28 LAB — FOLATE: FOLATE: 7.7 ng/mL (ref >5.9)

## 2016-06-28 LAB — HEMOGLOBIN A1C
Hgb A1c MFr Bld: 7.3 % — ABNORMAL HIGH (ref 4.8–5.6)
MEAN PLASMA GLUCOSE: 163 mg/dL

## 2016-06-28 LAB — RETICULOCYTES
RBC.: 2.82 MIL/uL — ABNORMAL LOW (ref 3.87–5.11)
Retic Count, Absolute: 81.8 10*3/uL (ref 19.0–186.0)
Retic Ct Pct: 2.9 % (ref 0.4–3.1)

## 2016-06-28 LAB — VITAMIN B12: Vitamin B-12: 420 pg/mL (ref 180–914)

## 2016-06-28 LAB — OCCULT BLOOD X 1 CARD TO LAB, STOOL: FECAL OCCULT BLD: NEGATIVE

## 2016-06-28 MED ORDER — POTASSIUM CHLORIDE CRYS ER 20 MEQ PO TBCR
40.0000 meq | EXTENDED_RELEASE_TABLET | Freq: Once | ORAL | Status: AC
  Administered 2016-06-28: 40 meq via ORAL
  Filled 2016-06-28: qty 2

## 2016-06-28 MED ORDER — FUROSEMIDE 10 MG/ML IJ SOLN
40.0000 mg | Freq: Two times a day (BID) | INTRAMUSCULAR | Status: DC
  Administered 2016-06-28 – 2016-06-29 (×3): 40 mg via INTRAVENOUS
  Filled 2016-06-28 (×3): qty 4

## 2016-06-28 MED ORDER — POLYETHYLENE GLYCOL 3350 17 G PO PACK
17.0000 g | PACK | Freq: Every day | ORAL | Status: DC
  Administered 2016-06-29 – 2016-06-30 (×2): 17 g via ORAL
  Filled 2016-06-28 (×2): qty 1

## 2016-06-28 MED ORDER — PREMIER PROTEIN SHAKE
11.0000 [oz_av] | Freq: Two times a day (BID) | ORAL | Status: DC
  Administered 2016-06-28 – 2016-06-30 (×5): 11 [oz_av] via ORAL
  Filled 2016-06-28 (×5): qty 325.31

## 2016-06-28 MED ORDER — FERROUS SULFATE 325 (65 FE) MG PO TABS
325.0000 mg | ORAL_TABLET | Freq: Every day | ORAL | Status: DC
  Administered 2016-06-29 – 2016-06-30 (×2): 325 mg via ORAL
  Filled 2016-06-28 (×2): qty 1

## 2016-06-28 NOTE — Plan of Care (Signed)
Problem: Activity: Goal: Risk for activity intolerance will decrease Outcome: Progressing .  Problem: Fluid Volume: Goal: Ability to maintain a balanced intake and output will improve Outcome: Progressing .  Problem: Nutrition: Goal: Adequate nutrition will be maintained Outcome: Progressing .  Problem: Bowel/Gastric: Goal: Will not experience complications related to bowel motility Outcome: Progressing .

## 2016-06-28 NOTE — Evaluation (Signed)
Occupational Therapy Evaluation Patient Details Name: Jamie Mason MRN: [id number] DOB: July 16, 1940 Today's Date: 06/28/2016    History of Present Illness 76 y.o. female with medical history significant of coronary artery disease, non-STEMI, type 2 diabetes, hyperlipidemia, hypertension, lumbar disc disease, morbid obesity who comes to the emergency department due to chronic lower extremity wounds that she has had for at least 2 months and weakness for the past 3 days.    Clinical Impression   Pt admitted with LE wounds Pt currently with functional limitations due to the deficits listed below (see OT Problem List).  Pt will benefit from skilled OT to increase their safety and independence with ADL and functional mobility for ADL to facilitate discharge to venue listed below.      Follow Up Recommendations  SNF    Equipment Recommendations  None recommended by OT       Precautions / Restrictions Precautions Precautions: Fall Precaution Comments: fell out of recliner while sleeping a few weeks ago, no other falls in past year Restrictions Weight Bearing Restrictions: No      Mobility Bed Mobility Overal bed mobility: Needs Assistance Bed Mobility: Supine to Sit     Supine to sit: Mod assist;HOB elevated     General bed mobility comments: Pt very SOB sitting EOB - sats 92-95 with 2L of oxygen, pt wanted to return to supine- RN aware  Transfers                 General transfer comment: did not perform         ADL           Pt able to sit EOB and wash face with set up -but then needed to transition to supine with HOB raised as she was SOB- RN aware                                               Pertinent Vitals/Pain Pain Assessment: No/denies pain        Extremity/Trunk Assessment         Cervical / Trunk Assessment Cervical / Trunk Assessment: Normal   Communication Communication Communication: No difficulties   Cognition  Arousal/Alertness: Awake/alert Behavior During Therapy: WFL for tasks assessed/performed Overall Cognitive Status: Within Functional Limits for tasks assessed                                Home Living Family/patient expects to be discharged to:: Skilled nursing facility Living Arrangements: Spouse/significant other;Children Available Help at Discharge: Family;Available 24 hours/day   Home Access: Stairs to enter [employer] of Steps: 2 Entrance Stairs-Rails: Right Home Layout: One level               Home Equipment: Cane - single point   Additional Comments: husband is blind      Prior Functioning/Environment Level of Independence: Independent with assistive device(s)        Comments: walks with cane, sleeps in recliner, sponge bathes        OT Problem List: Decreased strength;Decreased activity tolerance;Cardiopulmonary status limiting activity;Decreased safety awareness;Decreased knowledge of use of DME or AE   OT Treatment/Interventions: Self-care/ADL training;DME and/or AE instruction;Patient/family education    OT Goals(Current goals can be found in the care plan section) Acute Rehab OT Goals Patient  Stated Goal: to return home OT Goal Formulation: With patient Time For Goal Achievement: 07/12/16  OT Frequency: Min 2X/week   Barriers to D/C: Decreased caregiver support             End of Session Equipment Utilized During Treatment: Oxygen Nurse Communication: Mobility status;Other (comment) (OT session)  Activity Tolerance: Other (comment) (limited by SOB) Patient left: in bed;with call bell/phone within reach;with family/visitor present   Time: [id number] OT Time Calculation (min): 13 min Charges:  OT General Charges $OT Visit: 1 Procedure OT Evaluation $OT Eval Moderate Complexity: 1 Procedure G-Codes:    Alba CoryEDDING, Chae Shuster D 06/28/2016, 12:19 PM

## 2016-06-28 NOTE — Progress Notes (Signed)
PROGRESS NOTE    Jamie Mason  ZOX:[id number] DOB: 12-Jan-1941 DOA: 06/26/2016 PCP: Blanch Media, MD   Chief Complaint  Patient presents with  . Wound Check     Brief Narrative:  HPI on 06/26/2016 by Dr. Sanda Klein Jamie Mason is a 76 y.o. female with medical history significant of coronary artery disease, non-STEMI, type 2 diabetes, hyperlipidemia, hypertension, lumbar disc disease, morbid obesity who comes to the emergency department due to chronic lower extremity wounds that she has had for at least 2 months and weakness for the past 3 days.  She provides limited information, but denies history of trauma to the area, fever, chills, but states she feels fatigue. She complains of frequent lower extremity edema. She complains of frequent dyspnea on exertion, occasional chest pressure, but and no episodes recently.  Assessment & Plan   SIRS secondary to lower extremity wounds, ?cellulitis -Presented with leukocytosis, tachycardia, tachypnea -Continues to have leukocytosis at 16 -Lower extremity wounds did not appear to be purulent on my initial exam. Difficult to state whether patient had cellulitis- appears to be more venous stasis/insufficency related. -Lower extremity x-rays unremarkable for fracture or osteomyelitis -Wound care consulted and recommended home health, UNNA boots -Placed on empiric antibiotics vancomycin and cefepime. Spoke with ID, recommended discontinuing antibiotics and watching.  -Blood cultures still pending.  -PT consult recommended SNF -ABI: Unable to ascertain ABIs due to large open wounds  Dyspnea on exertion/Acute diastolic heart failure -Echocardiogram: EF 60-65%, Grade 2 diastolic dysfunction -Patient was given IV fluids upon admission, discontinued -Obtain BNP, 1283 -Placed on Lasix 40 mg IV twice a day -Monitor intake and output, daily weights  Possible sleep apnea -Patient will need outpatient sleep study  Asymptomatic bacteruria -UA  showed few bacteria, TNTC WBC, large leukocytes -Patient currently asymptomatic and not complaining of any pyuria or dysuria  Normocytic Anemia -Hemoglobin currently 8.2, was noted to be 11 in 2015, 13 in 2016 -Anemia panel: Iron 26, Ferritin 227 -FOBT pending  -continue to monitor CBC -Will start patient on oral supplementation   Essential hypertension -Continue verapamil, HCTZ  Hyperlipidemia -Continue statin  Lumbar disc disease -Continue pain control as needed  CAD -Continue aspirin, statin -Denies chest pain  Diabetes mellitus, type II -Metformin held -Continue insulin by scale CBG monitoring -Hemoglobin A1c 7.3  Chronic kidney disease, stage III -Creatinine currently stable -continue to monitor BMP closely as patient is diuresing  Hypokalemia -Will replace and continue to monitor BMP  Morbid obesity -BMI 53.4 -patient will need to follow up with her PCP to discuss lifestyle modifications  Deconditioning -Consulted PT/OT, recommended SNF.   -Patient would like to think about SNF vs home.  Not sure if patient can care for herself, as she was unable to tell me about her lower extremity wounds, when she noticed them, etc.  DVT Prophylaxis  Lovenox  Code Status: Full  Family Communication: None at bedside  Disposition Plan: Admitted. Pending blood cultures, and decision regarding SNF.  Consultants None  Procedures  Echocardiogram ABI  Antibiotics   Anti-infectives    Start     Dose/Rate Route Frequency Ordered Stop   06/27/16 2200  vancomycin (VANCOCIN) 1,750 mg in sodium chloride 0.9 % 500 mL IVPB  Status:  Discontinued     1,750 mg 250 mL/hr over 120 Minutes Intravenous Every 24 hours 06/26/16 2001 06/28/16 1051   06/27/16 1000  ceFEPIme (MAXIPIME) 1 g in dextrose 5 % 50 mL IVPB  Status:  Discontinued     1  g 100 mL/hr over 30 Minutes Intravenous Every 12 hours 06/26/16 2001 06/27/16 0805   06/27/16 1000  ceFEPIme (MAXIPIME) 2 g in dextrose 5 % 50  mL IVPB  Status:  Discontinued     2 g 100 mL/hr over 30 Minutes Intravenous Every 12 hours 06/27/16 0805 06/28/16 1051   06/26/16 2200  ceFEPIme (MAXIPIME) 1 g in dextrose 5 % 50 mL IVPB  Status:  Discontinued     1 g 100 mL/hr over 30 Minutes Intravenous Every 12 hours 06/26/16 1830 06/26/16 1841   06/26/16 2200  piperacillin-tazobactam (ZOSYN) IVPB 3.375 g  Status:  Discontinued     3.375 g 12.5 mL/hr over 240 Minutes Intravenous Every 12 hours 06/26/16 1830 06/26/16 1841   06/26/16 2200  ceFEPIme (MAXIPIME) 2 g in dextrose 5 % 50 mL IVPB     2 g 100 mL/hr over 30 Minutes Intravenous  Once 06/26/16 2127 06/26/16 2309   06/26/16 2200  vancomycin (VANCOCIN) IVPB 1000 mg/200 mL premix  Status:  Discontinued     1,000 mg 200 mL/hr over 60 Minutes Intravenous  Once 06/26/16 2128 06/28/16 1051   06/26/16 2015  vancomycin (VANCOCIN) IVPB 1000 mg/200 mL premix  Status:  Discontinued     1,000 mg 200 mL/hr over 60 Minutes Intravenous NOW 06/26/16 2001 06/26/16 2340   06/26/16 1845  ceFEPIme (MAXIPIME) 2 g in dextrose 5 % 50 mL IVPB  Status:  Discontinued     2 g 100 mL/hr over 30 Minutes Intravenous  Once 06/26/16 1843 06/26/16 2126   06/26/16 1830  vancomycin (VANCOCIN) 1,500 mg in sodium chloride 0.9 % 500 mL IVPB  Status:  Discontinued     1,500 mg 250 mL/hr over 120 Minutes Intravenous Every 12 hours 06/26/16 1804 06/26/16 2129      Subjective:   Arta BruceGloria Felter seen and examined today. Patient denies current pain.  Feels better today.  Denies chest pain, shortness of breath, abdominal pain, N/V/D/C.   Objective:   Vitals:   06/28/16 0023 06/28/16 0544 06/28/16 0935 06/28/16 1214  BP:  (!) 128/51    Pulse:  84  69  Resp:  20    Temp: 99.5 F (37.5 C) 99.1 F (37.3 C)    TempSrc: Oral Oral    SpO2:  93% 95% 93%  Weight:      Height:        Intake/Output Summary (Last 24 hours) at 06/28/16 1345 Last data filed at 06/28/16 0600  Gross per 24 hour  Intake             3660 ml   Output                0 ml  Net             3660 ml   Filed Weights   06/26/16 1945 06/26/16 2107  Weight: (!) 147.9 kg (326 lb) (!) 145.2 kg (320 lb 1.7 oz)    Exam  General: Well developed, well nourished, NAD, appears stated age  HEENT: NCAT, mucous membranes moist.   Cardiovascular: S1 S2 auscultated, no murmurs, RRR  Respiratory: Clear to auscultation bilaterally with equal chest rise  Abdomen: Soft, obesity, nontender, nondistended, + bowel sounds  Extremities: warm dry without cyanosis clubbing. Lower extremity UNNA boots.  Neuro: AAOx3, nonfocal  Psych: Normal affect and demeanor   Data Reviewed: I have personally reviewed following labs and imaging studies  CBC:  Recent Labs Lab 06/26/16 1836 06/27/16 0509 06/28/16 [id number]  WBC 16.5* 16.6* 16.3*  NEUTROABS 13.1* 12.7* 10.7*  HGB 9.3* 8.6* 8.2*  HCT 28.9* 26.4* 24.6*  MCV 87.8 87.4 87.2  PLT 365 351 345   Basic Metabolic Panel:  Recent Labs Lab 06/26/16 1836 06/27/16 0509 06/28/16 0615  NA 140 141 140  K 3.6 3.6 3.4*  CL 102 107 107  CO2 29 26 23   GLUCOSE 162* 123* 120*  BUN 17 17 21*  CREATININE 1.23* 1.07* 1.33*  CALCIUM 8.4* 8.1* 8.0*  MG 1.8  --   --    GFR: Estimated Creatinine Clearance: 54.9 mL/min (by C-G formula based on SCr of 1.33 mg/dL (H)). Liver Function Tests:  Recent Labs Lab 06/26/16 1836 06/27/16 0509  AST 97* 74*  ALT 29 25  ALKPHOS 62 53  BILITOT 0.7 0.7  PROT 7.4 6.4*  ALBUMIN 2.6* 2.2*   No results for input(s): LIPASE, AMYLASE in the last 168 hours. No results for input(s): AMMONIA in the last 168 hours. Coagulation Profile: No results for input(s): INR, PROTIME in the last 168 hours. Cardiac Enzymes: No results for input(s): CKTOTAL, CKMB, CKMBINDEX, TROPONINI in the last 168 hours. BNP (last 3 results) No results for input(s): PROBNP in the last 8760 hours. HbA1C:  Recent Labs  06/27/16 0509  HGBA1C 7.3*   CBG:  Recent Labs Lab 06/27/16 1202  06/27/16 1706 06/27/16 2104 06/28/16 0724 06/28/16 1143  GLUCAP 147* 168* 147* 109* 164*   Lipid Profile: No results for input(s): CHOL, HDL, LDLCALC, TRIG, CHOLHDL, LDLDIRECT in the last 72 hours. Thyroid Function Tests: No results for input(s): TSH, T4TOTAL, FREET4, T3FREE, THYROIDAB in the last 72 hours. Anemia Panel:  Recent Labs  06/28/16 0615 06/28/16 0621  VITAMINB12  --  420  FOLATE  --  7.7  FERRITIN  --  227  TIBC  --  197*  IRON  --  26*  RETICCTPCT 2.9  --    Urine analysis:    Component Value Date/Time   COLORURINE YELLOW 06/27/2016 1221   APPEARANCEUR CLOUDY (A) 06/27/2016 1221   LABSPEC 1.021 06/27/2016 1221   PHURINE 5.0 06/27/2016 1221   GLUCOSEU NEGATIVE 06/27/2016 1221   HGBUR SMALL (A) 06/27/2016 1221   BILIRUBINUR NEGATIVE 06/27/2016 1221   BILIRUBINUR Negative 03/19/2014 1631   KETONESUR NEGATIVE 06/27/2016 1221   PROTEINUR 30 (A) 06/27/2016 1221   UROBILINOGEN 0.2 03/19/2014 1631   NITRITE NEGATIVE 06/27/2016 1221   LEUKOCYTESUR LARGE (A) 06/27/2016 1221   Sepsis Labs: @LABRCNTIP (procalcitonin:4,lacticidven:4)  ) Recent Results (from the past 240 hour(s))  Blood culture (routine x 2)     Status: None (Preliminary result)   Collection Time: 06/26/16  6:36 PM  Result Value Ref Range Status   Specimen Description BLOOD RIGHT HAND  Final   Special Requests BOTTLES DRAWN AEROBIC AND ANAEROBIC 5CC  Final   Culture PENDING  Incomplete   Report Status PENDING  Incomplete      Radiology Studies: Dg Tibia/fibula Left  Result Date: 06/26/2016 CLINICAL DATA:  bil lower extremity open wounds, flesh degradation, worse distally, pt denies pain, ? Osteomyelitis, denies inj EXAM: LEFT TIBIA AND FIBULA - 2 VIEW COMPARISON:  None. FINDINGS: No fracture. No bone lesion. There are no areas of bone resorption to suggest osteomyelitis. There is marked narrowing of the medial knee joint space compartment with marginal osteophytes, consistent with  osteoarthritis. Ankle joint is normally aligned. There is diffuse subcutaneous soft tissue edema/cellulitis. No soft tissue air. IMPRESSION: 1. No fracture, bone lesion or evidence of osteomyelitis.  No soft tissue air. Electronically Signed   By: Amie Portland M.D.   On: 06/26/2016 17:31   Dg Tibia/fibula Right  Result Date: 06/26/2016 CLINICAL DATA:  Open wounds.  Evaluate for osteomyelitis. EXAM: RIGHT TIBIA AND FIBULA - 2 VIEW COMPARISON:  None. FINDINGS: No fracture. No worrisome lytic or sclerotic osseous abnormality. Degenerative changes are noted in the knee. IMPRESSION: Negative. Electronically Signed   By: Kennith Center M.D.   On: 06/26/2016 17:29   Dg Ankle Complete Left  Result Date: 06/26/2016 CLINICAL DATA:  bil lower extremity open wounds, flesh degradation, worse distally, pt denies pain, ? Osteomyelitis, denies inj EXAM: LEFT ANKLE COMPLETE - 3+ VIEW COMPARISON:  None. FINDINGS: No fracture. No bone lesion. There are no areas of bone resorption to suggest osteomyelitis. There are prominent dorsal plantar calcaneal spurs. There is diffuse soft tissue edema/ cellulitis.  No soft tissue air. IMPRESSION: 1. No fracture, dislocation or evidence of osteomyelitis. Electronically Signed   By: Amie Portland M.D.   On: 06/26/2016 17:30   Dg Ankle Complete Right  Result Date: 06/26/2016 CLINICAL DATA:  Open wounds.  Evaluate for osteomyelitis. EXAM: RIGHT ANKLE - COMPLETE 3+ VIEW COMPARISON:  None. FINDINGS: No fracture. No subluxation or dislocation. No gross bony lie cysts in the ankle. Soft tissue edema is noted diffusely. IMPRESSION: No radiographic findings to suggest osteomyelitis. Electronically Signed   By: Kennith Center M.D.   On: 06/26/2016 17:26   Dg Foot Complete Left  Result Date: 06/26/2016 CLINICAL DATA:  bil lower extremity open wounds, flesh degradation, worse distally, pt denies pain, ? Osteomyelitis, denies inj EXAM: LEFT FOOT - COMPLETE 3+ VIEW COMPARISON:  None. FINDINGS: No  fracture. No bone lesion. There are no areas of bone resorption to suggest osteomyelitis. Mild spurring is noted along the dorsal aspect of the midfoot. There is mild asymmetric joint space narrowing of the first metatarsophalangeal joint. These findings are consistent with mild osteoarthritis. There is diffuse soft tissue edema and/or cellulitis. No soft tissue air. There are prominent dorsal plantar calcaneal spurs. IMPRESSION: 1. No fracture, bone lesion or evidence of osteomyelitis. Electronically Signed   By: Amie Portland M.D.   On: 06/26/2016 17:32   Dg Foot Complete Right  Result Date: 06/26/2016 CLINICAL DATA:  Open wounds. EXAM: RIGHT FOOT COMPLETE - 3+ VIEW COMPARISON:  None. FINDINGS: Three views study shows no fracture. No subluxation or dislocation. No definite gross bony destruction to suggest osteomyelitis. IMPRESSION: Negative.MRI without with contrast would be a more sensitive means to investigate for osteomyelitis, as clinically warranted. Electronically Signed   By: Kennith Center M.D.   On: 06/26/2016 17:28     Scheduled Meds: . aspirin EC  81 mg Oral Daily  . atorvastatin  40 mg Oral q1800  . enoxaparin (LOVENOX) injection  70 mg Subcutaneous QHS  . famotidine  20 mg Oral BID  . furosemide  40 mg Intravenous BID  . hydrocerin   Topical Once per day on Mon Thu  . hydrochlorothiazide  25 mg Oral Daily  . Influenza vac split quadrivalent PF  0.5 mL Intramuscular Tomorrow-1000  . insulin aspart  0-20 Units Subcutaneous TID WC  . ipratropium-albuterol  3 mL Nebulization TID  . polyethylene glycol  17 g Oral Daily  . sodium chloride flush  3 mL Intravenous Q12H  . verapamil  240 mg Oral BID   Continuous Infusions:    LOS: 2 days   Time Spent in minutes   30 minutes  Nel Stoneking,  Starr Urias D.O. on 06/28/2016 at 1:45 PM  Between 7am to 7pm - Pager - [phone]  After 7pm go to [email or web address] - password [email or web address]  And look for the night coverage person covering for me after  hours  [facility] Office  [phone]

## 2016-06-28 NOTE — Progress Notes (Signed)
Initial Nutrition Assessment  DOCUMENTATION CODES:   Morbid obesity  INTERVENTION:   Premier Protein BID- Each supplement provides 160kcal and 30g protein.   NUTRITION DIAGNOSIS:   Inadequate oral intake related to acute illness as evidenced by meal completion < 25%.  GOAL:   Patient will meet greater than or equal to 90% of their needs  MONITOR:   PO intake, Supplement acceptance, Weight trends, Skin  REASON FOR ASSESSMENT:   Rounds    ASSESSMENT:   76 y.o. female with medical history significant of coronary artery disease, non-STEMI, type 2 diabetes, hyperlipidemia, hypertension, lumbar disc disease, morbid obesity who comes to the emergency department due to chronic lower extremity wounds that she has had for at least 2 months and weakness for the past 3 days.    Met with pt in room today. Pt reports poor appetite and oral intake for 3 days pta. Pt currently eating <25% meals but states that she does not like the hospital food and feels like she would eat well at home. Per chart, pt is wt stable. Pt likes Ensure but will try Premier protein in setting of morbid obesity. Encourage PO intake.   Medications reviewed and include: aspirin, cefepine, lovenox, pepcid, lasix, hydrochlorothiazide, insulin, miralax, vancomycin  Labs reviewed: K 3.4(L), BUN 21(H), creat 1.33(H), Ca 8.0(L) ad. 9.44 wnl, Alb 2.2(L) 1/15, AST 74(H) 1/15, BNP 1283.8(H) 1/15 Iron 26(L), TIBC 197(L) WBC- 16.3(H), Hgb 8.2(L), Hct 24.6(L)  Nutrition-Focused physical exam completed. Findings are no fat depletion, no muscle depletion, and moderate edema.   Diet Order:  Diet heart healthy/carb modified Room service appropriate? Yes; Fluid consistency: Thin  Skin:   Bilateral LE wounds/cellulitis   Last BM:  1/13  Height:   Ht Readings from Last 1 Encounters:  06/26/16 '5\' 5"'  (1.651 m)    Weight:   Wt Readings from Last 1 Encounters:  06/26/16 (!) 320 lb 1.7 oz (145.2 kg)    Ideal Body Weight:   56.8 kg  BMI:  Body mass index is 53.27 kg/m.  Estimated Nutritional Needs:   Kcal:  2100-2300kcal/day   Protein:  116-145g/day  Fluid:  >2L/day   EDUCATION NEEDS:   No education needs identified at this time  Koleen Distance, RD, LDN Pager #[phone]

## 2016-06-29 DIAGNOSIS — L03115 Cellulitis of right lower limb: Secondary | ICD-10-CM

## 2016-06-29 DIAGNOSIS — L03116 Cellulitis of left lower limb: Secondary | ICD-10-CM

## 2016-06-29 LAB — GLUCOSE, CAPILLARY
GLUCOSE-CAPILLARY: 127 mg/dL — AB (ref 65–99)
GLUCOSE-CAPILLARY: 130 mg/dL — AB (ref 65–99)
GLUCOSE-CAPILLARY: 123 mg/dL — AB (ref 65–99)
Glucose-Capillary: 125 mg/dL — ABNORMAL HIGH (ref 65–99)

## 2016-06-29 LAB — BASIC METABOLIC PANEL
ANION GAP: 9 (ref 5–15)
BUN: 25 mg/dL — ABNORMAL HIGH (ref 6–20)
CALCIUM: 8.3 mg/dL — AB (ref 8.9–10.3)
CO2: 26 mmol/L (ref 22–32)
Chloride: 104 mmol/L (ref 101–111)
Creatinine, Ser: 1.22 mg/dL — ABNORMAL HIGH (ref 0.44–1.00)
GFR calc non Af Amer: 43 mL/min — ABNORMAL LOW (ref >60)
GFR, EST AFRICAN AMERICAN: 50 mL/min — AB (ref >60)
Glucose, Bld: 142 mg/dL — ABNORMAL HIGH (ref 65–99)
POTASSIUM: 3.5 mmol/L (ref 3.5–5.1)
SODIUM: 139 mmol/L (ref 135–145)

## 2016-06-29 LAB — CBC WITH DIFFERENTIAL/PLATELET
BASOS ABS: 0 10*3/uL (ref 0.0–0.1)
BASOS PCT: 0 %
EOS PCT: 4 %
Eosinophils Absolute: 0.7 10*3/uL (ref 0.0–0.7)
HCT: 24.9 % — ABNORMAL LOW (ref 36.0–46.0)
Hemoglobin: 8.3 g/dL — ABNORMAL LOW (ref 12.0–15.0)
LYMPHS PCT: 19 %
Lymphs Abs: 2.8 10*3/uL (ref 0.7–4.0)
MCH: 29.1 pg (ref 26.0–34.0)
MCHC: 33.3 g/dL (ref 30.0–36.0)
MCV: 87.4 fL (ref 78.0–100.0)
MONO ABS: 1.1 10*3/uL — AB (ref 0.1–1.0)
Monocytes Relative: 7 %
Neutro Abs: 10.6 10*3/uL — ABNORMAL HIGH (ref 1.7–7.7)
Neutrophils Relative %: 70 %
PLATELETS: 353 10*3/uL (ref 150–400)
RBC: 2.85 MIL/uL — AB (ref 3.87–5.11)
RDW: 15.5 % (ref 11.5–15.5)
WBC: 15.2 10*3/uL — AB (ref 4.0–10.5)

## 2016-06-29 MED ORDER — HYDROCERIN EX CREA
TOPICAL_CREAM | CUTANEOUS | Status: DC | PRN
  Administered 2016-06-29: 1 via TOPICAL
  Administered 2016-06-29: 12:00:00 via TOPICAL
  Filled 2016-06-29: qty 113

## 2016-06-29 MED ORDER — FUROSEMIDE 40 MG PO TABS
40.0000 mg | ORAL_TABLET | Freq: Two times a day (BID) | ORAL | Status: DC
  Administered 2016-06-29 – 2016-06-30 (×3): 40 mg via ORAL
  Filled 2016-06-29 (×3): qty 1

## 2016-06-29 NOTE — Progress Notes (Signed)
PROGRESS NOTE    Jamie Mason  ZOX:[id number] DOB: 17-Jul-1940 DOA: 06/26/2016 PCP: Blanch Media, MD     Brief Narrative:  Jamie Mason a 76 y.o.femalewith medical history significant of coronary artery disease, non-STEMI, type 2 diabetes, hyperlipidemia, hypertension, lumbar disc disease, morbid obesity who comes to the emergency department due to chronic lower extremity wounds that she has had for at least 2 months and weakness for the past 3 days. She was admitted for possible cellulitis of bilateral lower extremities as well as acute diastolic heart failure.   Assessment & Plan:   Principal Problem:   Cellulitis of both lower extremities Active Problems:   Hypertension   Hyperlipidemia   Lumbar disc disease   CAD (coronary artery disease)   Dyspnea on exertion   Type 2 diabetes mellitus with stage 2 chronic kidney disease (HCC)   SIRS (systemic inflammatory response syndrome) (HCC)   SIRS secondary to lower extremity wounds, ?cellulitis -Presented with leukocytosis, tachycardia, tachypnea -Questionable cellulitis, could be chronic venous stasis/insufficency related -Lower extremity x-rays unremarkable for fracture or osteomyelitis -Wound care consulted and recommended home health, UNNA boots -Placed on empiric antibiotics vancomycin and cefepime. Dr. Catha Gosselin spoke with ID, recommended discontinuing antibiotics and watching. WBC mildly improved today, afebrile  -Blood cultures still NGTD  Acute diastolic heart failure -Echocardiogram: EF 60-65%, Grade 2 diastolic dysfunction -BNP 1283 -Weight 326 --> 318lb -Transition lasix to oral today. Monitor I/Os, weight.   Possible sleep apnea -Patient will need outpatient sleep study  Asymptomatic bacteruria -UA showed few bacteria, TNTC WBC, large leukocytes -Patient currently asymptomatic and not complaining of any pyuria or dysuria  Chronic normocytic anemia of chronic disease  -Hemoglobin currently 8.3,  was noted to be 11 in 2015, 13 in 2016 -Anemia panel: Iron 26, Ferritin 227 -FOBT negative   Essential hypertension -Continue verapamil, HCTZ  Hyperlipidemia -Continue statin  Lumbar disc disease -Continue pain control as needed  CAD -Continue aspirin, statin -Denies chest pain  Diabetes mellitus, type II -Hemoglobin A1c 7.3 -Metformin held -Continue insulin by scale CBG monitoring  Chronic kidney disease, stage III -Stable   Morbid obesity -BMI 53.4 -Patient will need to follow up with her PCP to discuss lifestyle modifications  Deconditioning -Consulted PT/OT, recommended SNF. Agreeable to go to SNF then have HHRN at home for help.    DVT prophylaxis: lovenox Code Status: full Family Communication: no family at bedside Disposition Plan: SNF pending   Consultants:   None  Procedures:   None  Antimicrobials:  Anti-infectives    Start     Dose/Rate Route Frequency Ordered Stop   06/27/16 2200  vancomycin (VANCOCIN) 1,750 mg in sodium chloride 0.9 % 500 mL IVPB  Status:  Discontinued     1,750 mg 250 mL/hr over 120 Minutes Intravenous Every 24 hours 06/26/16 2001 06/28/16 1051   06/27/16 1000  ceFEPIme (MAXIPIME) 1 g in dextrose 5 % 50 mL IVPB  Status:  Discontinued     1 g 100 mL/hr over 30 Minutes Intravenous Every 12 hours 06/26/16 2001 06/27/16 0805   06/27/16 1000  ceFEPIme (MAXIPIME) 2 g in dextrose 5 % 50 mL IVPB  Status:  Discontinued     2 g 100 mL/hr over 30 Minutes Intravenous Every 12 hours 06/27/16 0805 06/28/16 1051   06/26/16 2200  ceFEPIme (MAXIPIME) 1 g in dextrose 5 % 50 mL IVPB  Status:  Discontinued     1 g 100 mL/hr over 30 Minutes Intravenous Every 12 hours 06/26/16 1830 06/26/16  1841   06/26/16 2200  piperacillin-tazobactam (ZOSYN) IVPB 3.375 g  Status:  Discontinued     3.375 g 12.5 mL/hr over 240 Minutes Intravenous Every 12 hours 06/26/16 1830 06/26/16 1841   06/26/16 2200  ceFEPIme (MAXIPIME) 2 g in dextrose 5 % 50 mL  IVPB     2 g 100 mL/hr over 30 Minutes Intravenous  Once 06/26/16 2127 06/26/16 2309   06/26/16 2200  vancomycin (VANCOCIN) IVPB 1000 mg/200 mL premix  Status:  Discontinued     1,000 mg 200 mL/hr over 60 Minutes Intravenous  Once 06/26/16 2128 06/28/16 1051   06/26/16 2015  vancomycin (VANCOCIN) IVPB 1000 mg/200 mL premix  Status:  Discontinued     1,000 mg 200 mL/hr over 60 Minutes Intravenous NOW 06/26/16 2001 06/26/16 2340   06/26/16 1845  ceFEPIme (MAXIPIME) 2 g in dextrose 5 % 50 mL IVPB  Status:  Discontinued     2 g 100 mL/hr over 30 Minutes Intravenous  Once 06/26/16 1843 06/26/16 2126   06/26/16 1830  vancomycin (VANCOCIN) 1,500 mg in sodium chloride 0.9 % 500 mL IVPB  Status:  Discontinued     1,500 mg 250 mL/hr over 120 Minutes Intravenous Every 12 hours 06/26/16 1804 06/26/16 2129      Subjective: Patient's main complaint this morning is dry skin of her upper extremities and itching. No acute events. She currently denies any chest pain or shortness of breath, nausea or vomiting or abdominal pain.  Objective: Vitals:   06/29/16 0531 06/29/16 0816 06/29/16 1214 06/29/16 1223  BP: 132/71  (!) 125/97 90/66  Pulse: 91  100 98  Resp: 18  20   Temp: 97.6 F (36.4 C)  99.7 F (37.6 C) 98 F (36.7 C)  TempSrc: Oral  Oral Oral  SpO2: 96% 100%  91%  Weight: (!) 144.5 kg (318 lb 9 oz)     Height:        Intake/Output Summary (Last 24 hours) at 06/29/16 1251 Last data filed at 06/29/16 0600  Gross per 24 hour  Intake              540 ml  Output             2350 ml  Net            -1810 ml   Filed Weights   06/26/16 1945 06/26/16 2107 06/29/16 0531  Weight: (!) 147.9 kg (326 lb) (!) 145.2 kg (320 lb 1.7 oz) (!) 144.5 kg (318 lb 9 oz)    Examination:  General exam: Appears calm and comfortable  Respiratory system: Clear to auscultation. Upper respiratory stridor. Respiratory effort normal. Cardiovascular system: S1 & S2 heard, RRR. No JVD, murmurs, rubs, gallops or  clicks. No pedal edema. Gastrointestinal system: Abdomen is nondistended, soft and nontender. No organomegaly or masses felt. Normal bowel sounds heard. Central nervous system: Alert and oriented. No focal neurological deficits. Extremities: Symmetric 5 x 5 power. +UNNA boots in place  Skin: No rashes, lesions or ulcers Psychiatry: Judgement and insight appear normal. Mood & affect appropriate.   Data Reviewed: I have personally reviewed following labs and imaging studies  CBC:  Recent Labs Lab 06/26/16 1836 06/27/16 0509 06/28/16 0615 06/29/16 0609  WBC 16.5* 16.6* 16.3* 15.2*  NEUTROABS 13.1* 12.7* 10.7* 10.6*  HGB 9.3* 8.6* 8.2* 8.3*  HCT 28.9* 26.4* 24.6* 24.9*  MCV 87.8 87.4 87.2 87.4  PLT 365 351 345 353   Basic Metabolic Panel:  Recent Labs Lab  06/26/16 1836 06/27/16 0509 06/28/16 0615 06/29/16 0609  NA 140 141 140 139  K 3.6 3.6 3.4* 3.5  CL 102 107 107 104  CO2 29 26 23 26   GLUCOSE 162* 123* 120* 142*  BUN 17 17 21* 25*  CREATININE 1.23* 1.07* 1.33* 1.22*  CALCIUM 8.4* 8.1* 8.0* 8.3*  MG 1.8  --   --   --    GFR: Estimated Creatinine Clearance: 59.6 mL/min (by C-G formula based on SCr of 1.22 mg/dL (H)). Liver Function Tests:  Recent Labs Lab 06/26/16 1836 06/27/16 0509  AST 97* 74*  ALT 29 25  ALKPHOS 62 53  BILITOT 0.7 0.7  PROT 7.4 6.4*  ALBUMIN 2.6* 2.2*   No results for input(s): LIPASE, AMYLASE in the last 168 hours. No results for input(s): AMMONIA in the last 168 hours. Coagulation Profile: No results for input(s): INR, PROTIME in the last 168 hours. Cardiac Enzymes: No results for input(s): CKTOTAL, CKMB, CKMBINDEX, TROPONINI in the last 168 hours. BNP (last 3 results) No results for input(s): PROBNP in the last 8760 hours. HbA1C:  Recent Labs  06/27/16 0509  HGBA1C 7.3*   CBG:  Recent Labs Lab 06/28/16 1143 06/28/16 1615 06/28/16 2152 06/29/16 0738 06/29/16 1136  GLUCAP 164* 149* 147* 127* 130*   Lipid Profile: No  results for input(s): CHOL, HDL, LDLCALC, TRIG, CHOLHDL, LDLDIRECT in the last 72 hours. Thyroid Function Tests: No results for input(s): TSH, T4TOTAL, FREET4, T3FREE, THYROIDAB in the last 72 hours. Anemia Panel:  Recent Labs  06/28/16 0615 06/28/16 0621  VITAMINB12  --  420  FOLATE  --  7.7  FERRITIN  --  227  TIBC  --  197*  IRON  --  26*  RETICCTPCT 2.9  --    Sepsis Labs: No results for input(s): PROCALCITON, LATICACIDVEN in the last 168 hours.  Recent Results (from the past 240 hour(s))  Blood culture (routine x 2)     Status: None (Preliminary result)   Collection Time: 06/26/16  6:36 PM  Result Value Ref Range Status   Specimen Description BLOOD RIGHT HAND  Final   Special Requests BOTTLES DRAWN AEROBIC AND ANAEROBIC 5CC  Final   Culture   Final    NO GROWTH 1 DAY Performed at [facility], [address] [place], [place] [address]    Report Status PENDING  Incomplete  Blood culture (routine x 2)     Status: None (Preliminary result)   Collection Time: 06/26/16  6:36 PM  Result Value Ref Range Status   Specimen Description BLOOD UPPER ARM  Final   Special Requests BOTTLES DRAWN AEROBIC AND ANAEROBIC 5CC  Final   Culture   Final    NO GROWTH 1 DAY Performed at [facility], [address] [place], [place] [address]    Report Status PENDING  Incomplete       Radiology Studies: No results found.    Scheduled Meds: . aspirin EC  81 mg Oral Daily  . atorvastatin  40 mg Oral q1800  . enoxaparin (LOVENOX) injection  70 mg Subcutaneous QHS  . famotidine  20 mg Oral BID  . ferrous sulfate  325 mg Oral Q breakfast  . furosemide  40 mg Oral BID  . hydrocerin   Topical Once per day on Mon Thu  . hydrochlorothiazide  25 mg Oral Daily  . Influenza vac split quadrivalent PF  0.5 mL Intramuscular Tomorrow-1000  . insulin aspart  0-20 Units Subcutaneous  TID WC  . ipratropium-albuterol  3 mL Nebulization TID  . polyethylene glycol  17 g Oral Daily  .  protein supplement shake  11 oz Oral BID BM  . sodium chloride flush  3 mL Intravenous Q12H  . verapamil  240 mg Oral BID   Continuous Infusions:   LOS: 3 days    Time spent: 40 minutes   Noralee Stain, DO [facility] [email or web address] Password [email or web address] 06/29/2016, 12:51 PM

## 2016-06-29 NOTE — Progress Notes (Signed)
Physical Therapy Treatment Patient Details Name: Jamie NighGloria C Mason MRN: [id number] DOB: 02/08/1941 Today's Date: 06/29/2016    History of Present Illness 76 y.o. female with medical history significant of coronary artery disease, non-STEMI, type 2 diabetes, hyperlipidemia, hypertension, lumbar disc disease, morbid obesity who comes to the emergency department due to chronic lower extremity wounds that she has had for at least 2 months and weakness for the past 3 days.     PT Comments    Progressing slowly with mobility. Continue to recommend SNF.   Follow Up Recommendations  SNF     Equipment Recommendations   (TBD at next venue)    Recommendations for Other Services       Precautions / Restrictions Precautions Precautions: Fall Restrictions Weight Bearing Restrictions: No    Mobility  Bed Mobility Overal bed mobility: Needs Assistance Bed Mobility: Sit to Supine     Sit to Supine: Min assist        Transfers Overall transfer level: Needs assistance Equipment used: Rolling walker (2 wheeled) Transfers: Sit to/from [employer]Stand;Stand Pivot Transfers Sit to Stand: Min assist Stand pivot transfers: Min assist       General transfer comment: Assist to rise, stabilize, control descent.   Ambulation/Gait Ambulation/Gait assistance: Min assist;+2 safety/equipment Ambulation Distance (Feet): 7 Feet Assistive device: Rolling walker (2 wheeled) Gait Pattern/deviations: Step-through pattern;Decreased stride length;Decreased step length - right;Decreased step length - left     General Gait Details: Assist to stabilize. Wheezing and dyspnea 3/4 noted. Pt fatigues very easily.    Stairs            Wheelchair Mobility    Modified Rankin (Stroke Patients Only)       Balance                                    Cognition Arousal/Alertness: Awake/alert Behavior During Therapy: WFL for tasks assessed/performed Overall Cognitive Status: Within Functional  Limits for tasks assessed                      Exercises      General Comments        Pertinent Vitals/Pain Pain Assessment: No/denies pain    Home Living                      Prior Function            PT Goals (current goals can now be found in the care plan section) Progress towards PT goals: Progressing toward goals (slowly)    Frequency    Min 3X/week      PT Plan Current plan remains appropriate    Co-evaluation             End of Session   Activity Tolerance: Patient limited by fatigue Patient left: in bed;with call bell/phone within reach     Time: 1450-1521 PT Time Calculation (min) (ACUTE ONLY): 31 min  Charges:  $Gait Training: 8-22 mins $Therapeutic Activity: 8-22 mins                    G Codes:      Rebeca AlertJannie Laramie Gelles, MPT Pager: [phone]

## 2016-06-30 DIAGNOSIS — R069 Unspecified abnormalities of breathing: Secondary | ICD-10-CM | POA: Diagnosis not present

## 2016-06-30 DIAGNOSIS — I2510 Atherosclerotic heart disease of native coronary artery without angina pectoris: Secondary | ICD-10-CM | POA: Diagnosis not present

## 2016-06-30 DIAGNOSIS — R0902 Hypoxemia: Secondary | ICD-10-CM | POA: Diagnosis not present

## 2016-06-30 DIAGNOSIS — S81809D Unspecified open wound, unspecified lower leg, subsequent encounter: Secondary | ICD-10-CM | POA: Diagnosis not present

## 2016-06-30 DIAGNOSIS — I872 Venous insufficiency (chronic) (peripheral): Secondary | ICD-10-CM | POA: Diagnosis present

## 2016-06-30 DIAGNOSIS — R05 Cough: Secondary | ICD-10-CM | POA: Diagnosis not present

## 2016-06-30 DIAGNOSIS — Z7901 Long term (current) use of anticoagulants: Secondary | ICD-10-CM | POA: Diagnosis not present

## 2016-06-30 DIAGNOSIS — J9621 Acute and chronic respiratory failure with hypoxia: Secondary | ICD-10-CM | POA: Diagnosis not present

## 2016-06-30 DIAGNOSIS — E11622 Type 2 diabetes mellitus with other skin ulcer: Secondary | ICD-10-CM | POA: Diagnosis present

## 2016-06-30 DIAGNOSIS — R000 Tachycardia, unspecified: Secondary | ICD-10-CM | POA: Diagnosis present

## 2016-06-30 DIAGNOSIS — N390 Urinary tract infection, site not specified: Secondary | ICD-10-CM | POA: Diagnosis present

## 2016-06-30 DIAGNOSIS — Z6841 Body Mass Index (BMI) 40.0 and over, adult: Secondary | ICD-10-CM | POA: Diagnosis not present

## 2016-06-30 DIAGNOSIS — M6281 Muscle weakness (generalized): Secondary | ICD-10-CM | POA: Diagnosis not present

## 2016-06-30 DIAGNOSIS — R7989 Other specified abnormal findings of blood chemistry: Secondary | ICD-10-CM | POA: Diagnosis not present

## 2016-06-30 DIAGNOSIS — R062 Wheezing: Secondary | ICD-10-CM | POA: Diagnosis not present

## 2016-06-30 DIAGNOSIS — L03116 Cellulitis of left lower limb: Secondary | ICD-10-CM | POA: Diagnosis not present

## 2016-06-30 DIAGNOSIS — D649 Anemia, unspecified: Secondary | ICD-10-CM | POA: Diagnosis present

## 2016-06-30 DIAGNOSIS — Y92230 Patient room in hospital as the place of occurrence of the external cause: Secondary | ICD-10-CM | POA: Diagnosis not present

## 2016-06-30 DIAGNOSIS — L89893 Pressure ulcer of other site, stage 3: Secondary | ICD-10-CM | POA: Diagnosis present

## 2016-06-30 DIAGNOSIS — S81802D Unspecified open wound, left lower leg, subsequent encounter: Secondary | ICD-10-CM | POA: Diagnosis not present

## 2016-06-30 DIAGNOSIS — I25118 Atherosclerotic heart disease of native coronary artery with other forms of angina pectoris: Secondary | ICD-10-CM | POA: Diagnosis not present

## 2016-06-30 DIAGNOSIS — I509 Heart failure, unspecified: Secondary | ICD-10-CM | POA: Diagnosis not present

## 2016-06-30 DIAGNOSIS — N183 Chronic kidney disease, stage 3 (moderate): Secondary | ICD-10-CM | POA: Diagnosis present

## 2016-06-30 DIAGNOSIS — I5031 Acute diastolic (congestive) heart failure: Secondary | ICD-10-CM | POA: Diagnosis not present

## 2016-06-30 DIAGNOSIS — N179 Acute kidney failure, unspecified: Secondary | ICD-10-CM | POA: Diagnosis not present

## 2016-06-30 DIAGNOSIS — L89213 Pressure ulcer of right hip, stage 3: Secondary | ICD-10-CM | POA: Diagnosis not present

## 2016-06-30 DIAGNOSIS — J1000 Influenza due to other identified influenza virus with unspecified type of pneumonia: Secondary | ICD-10-CM | POA: Diagnosis not present

## 2016-06-30 DIAGNOSIS — R319 Hematuria, unspecified: Secondary | ICD-10-CM | POA: Diagnosis present

## 2016-06-30 DIAGNOSIS — I130 Hypertensive heart and chronic kidney disease with heart failure and stage 1 through stage 4 chronic kidney disease, or unspecified chronic kidney disease: Secondary | ICD-10-CM | POA: Diagnosis present

## 2016-06-30 DIAGNOSIS — A419 Sepsis, unspecified organism: Secondary | ICD-10-CM | POA: Diagnosis not present

## 2016-06-30 DIAGNOSIS — I248 Other forms of acute ischemic heart disease: Secondary | ICD-10-CM | POA: Diagnosis present

## 2016-06-30 DIAGNOSIS — L03115 Cellulitis of right lower limb: Secondary | ICD-10-CM | POA: Diagnosis not present

## 2016-06-30 DIAGNOSIS — J1100 Influenza due to unidentified influenza virus with unspecified type of pneumonia: Secondary | ICD-10-CM | POA: Diagnosis not present

## 2016-06-30 DIAGNOSIS — I5033 Acute on chronic diastolic (congestive) heart failure: Secondary | ICD-10-CM | POA: Diagnosis not present

## 2016-06-30 DIAGNOSIS — J189 Pneumonia, unspecified organism: Secondary | ICD-10-CM | POA: Diagnosis not present

## 2016-06-30 DIAGNOSIS — N182 Chronic kidney disease, stage 2 (mild): Secondary | ICD-10-CM | POA: Diagnosis not present

## 2016-06-30 DIAGNOSIS — J111 Influenza due to unidentified influenza virus with other respiratory manifestations: Secondary | ICD-10-CM | POA: Diagnosis not present

## 2016-06-30 DIAGNOSIS — T380X5A Adverse effect of glucocorticoids and synthetic analogues, initial encounter: Secondary | ICD-10-CM | POA: Diagnosis not present

## 2016-06-30 DIAGNOSIS — I480 Paroxysmal atrial fibrillation: Secondary | ICD-10-CM | POA: Diagnosis not present

## 2016-06-30 DIAGNOSIS — R748 Abnormal levels of other serum enzymes: Secondary | ICD-10-CM | POA: Diagnosis not present

## 2016-06-30 DIAGNOSIS — S81801D Unspecified open wound, right lower leg, subsequent encounter: Secondary | ICD-10-CM | POA: Diagnosis not present

## 2016-06-30 DIAGNOSIS — R269 Unspecified abnormalities of gait and mobility: Secondary | ICD-10-CM | POA: Diagnosis not present

## 2016-06-30 DIAGNOSIS — L03119 Cellulitis of unspecified part of limb: Secondary | ICD-10-CM | POA: Diagnosis not present

## 2016-06-30 DIAGNOSIS — R5381 Other malaise: Secondary | ICD-10-CM | POA: Diagnosis not present

## 2016-06-30 DIAGNOSIS — E1165 Type 2 diabetes mellitus with hyperglycemia: Secondary | ICD-10-CM | POA: Diagnosis not present

## 2016-06-30 DIAGNOSIS — E1122 Type 2 diabetes mellitus with diabetic chronic kidney disease: Secondary | ICD-10-CM | POA: Diagnosis not present

## 2016-06-30 DIAGNOSIS — I214 Non-ST elevation (NSTEMI) myocardial infarction: Secondary | ICD-10-CM | POA: Diagnosis not present

## 2016-06-30 DIAGNOSIS — Z7984 Long term (current) use of oral hypoglycemic drugs: Secondary | ICD-10-CM | POA: Diagnosis not present

## 2016-06-30 DIAGNOSIS — R531 Weakness: Secondary | ICD-10-CM | POA: Diagnosis not present

## 2016-06-30 DIAGNOSIS — J9600 Acute respiratory failure, unspecified whether with hypoxia or hypercapnia: Secondary | ICD-10-CM | POA: Diagnosis not present

## 2016-06-30 DIAGNOSIS — R0602 Shortness of breath: Secondary | ICD-10-CM | POA: Diagnosis not present

## 2016-06-30 DIAGNOSIS — J9620 Acute and chronic respiratory failure, unspecified whether with hypoxia or hypercapnia: Secondary | ICD-10-CM | POA: Diagnosis not present

## 2016-06-30 DIAGNOSIS — R0603 Acute respiratory distress: Secondary | ICD-10-CM | POA: Diagnosis not present

## 2016-06-30 DIAGNOSIS — R0989 Other specified symptoms and signs involving the circulatory and respiratory systems: Secondary | ICD-10-CM | POA: Diagnosis not present

## 2016-06-30 DIAGNOSIS — I4892 Unspecified atrial flutter: Secondary | ICD-10-CM | POA: Diagnosis present

## 2016-06-30 DIAGNOSIS — R0600 Dyspnea, unspecified: Secondary | ICD-10-CM | POA: Diagnosis not present

## 2016-06-30 LAB — CBC WITH DIFFERENTIAL/PLATELET
BASOS PCT: 0 %
Basophils Absolute: 0 10*3/uL (ref 0.0–0.1)
EOS ABS: 0.5 10*3/uL (ref 0.0–0.7)
EOS PCT: 4 %
HCT: 24.2 % — ABNORMAL LOW (ref 36.0–46.0)
Hemoglobin: 7.7 g/dL — ABNORMAL LOW (ref 12.0–15.0)
LYMPHS ABS: 3.3 10*3/uL (ref 0.7–4.0)
Lymphocytes Relative: 26 %
MCH: 28.2 pg (ref 26.0–34.0)
MCHC: 31.8 g/dL (ref 30.0–36.0)
MCV: 88.6 fL (ref 78.0–100.0)
Monocytes Absolute: 0.7 10*3/uL (ref 0.1–1.0)
Monocytes Relative: 6 %
Neutro Abs: 7.9 10*3/uL — ABNORMAL HIGH (ref 1.7–7.7)
Neutrophils Relative %: 64 %
PLATELETS: 387 10*3/uL (ref 150–400)
RBC: 2.73 MIL/uL — AB (ref 3.87–5.11)
RDW: 15.7 % — ABNORMAL HIGH (ref 11.5–15.5)
WBC: 12.4 10*3/uL — AB (ref 4.0–10.5)

## 2016-06-30 LAB — BASIC METABOLIC PANEL
Anion gap: 9 (ref 5–15)
BUN: 29 mg/dL — AB (ref 6–20)
CO2: 26 mmol/L (ref 22–32)
CREATININE: 1.23 mg/dL — AB (ref 0.44–1.00)
Calcium: 8.4 mg/dL — ABNORMAL LOW (ref 8.9–10.3)
Chloride: 104 mmol/L (ref 101–111)
GFR calc non Af Amer: 42 mL/min — ABNORMAL LOW (ref >60)
GFR, EST AFRICAN AMERICAN: 49 mL/min — AB (ref >60)
Glucose, Bld: 121 mg/dL — ABNORMAL HIGH (ref 65–99)
POTASSIUM: 3.6 mmol/L (ref 3.5–5.1)
Sodium: 139 mmol/L (ref 135–145)

## 2016-06-30 LAB — GLUCOSE, CAPILLARY
GLUCOSE-CAPILLARY: 122 mg/dL — AB (ref 65–99)
GLUCOSE-CAPILLARY: 110 mg/dL — AB (ref 65–99)
Glucose-Capillary: 127 mg/dL — ABNORMAL HIGH (ref 65–99)

## 2016-06-30 MED ORDER — HYDROCERIN EX CREA: TOPICAL_CREAM | CUTANEOUS | 0 refills | Status: DC

## 2016-06-30 MED ORDER — VERAPAMIL HCL ER 240 MG PO TBCR: 240.0000 mg | EXTENDED_RELEASE_TABLET | Freq: Two times a day (BID) | ORAL | 0 refills | Status: DC

## 2016-06-30 NOTE — Clinical Social Work Placement (Signed)
Patient is set to discharge [facility] today, once insurance authorization is obtained. CSW has submitted clinicals to [facility] & will await response.     Lincoln MaxinKelly Montine Hight, LCSW [facility] Clinical Social Worker cell #: [phone]    CLINICAL SOCIAL WORK PLACEMENT  NOTE  Date:  06/30/2016  Patient Details  Name: Jamie NighGloria C Grieshop MRN: [id number] Date of Birth: 07/13/1940  Clinical Social Work is seeking post-discharge placement for this patient at the Skilled  Nursing Facility level of care (*CSW will initial, date and re-position this form in  chart as items are completed):  Yes   Patient/family provided with [facility] Clinical Social Work Department's list of facilities offering this level of care within the geographic area requested by the patient (or if unable, by the patient's family).  Yes   Patient/family informed of their freedom to choose among providers that offer the needed level of care, that participate in Medicare, Medicaid or managed care program needed by the patient, have an available bed and are willing to accept the patient.  Yes   Patient/family informed of [facility] ownership interest in [facility] and [facility], as well as of the fact that they are under no obligation to receive care at these facilities.  PASRR submitted to [employer] on 06/27/16     PASRR number received on 06/27/16     Existing PASRR number confirmed on       FL2 transmitted to all facilities in geographic area requested by pt/family on 06/27/16     FL2 transmitted to all facilities within larger geographic area on       Patient informed that his/her managed care company has contracts with or will negotiate with certain facilities, including the following:        Yes   Patient/family informed of bed offers received.  [facility] bed [facility]     Physician recommends and patient chooses bed at      Patient  to be [facility] on 06/30/16.  Patient to be transferred to facility by [facility]     Patient family notified on 06/30/16 of transfer.  Name of family member notified:  patient's husband, [place] & daughter     PHYSICIAN       Additional Comment:    _______________________________________________ Arlyss RepressHarrison, Bernis Schreur F, LCSW 06/30/2016, 11:18 AM

## 2016-06-30 NOTE — Discharge Summary (Addendum)
Physician Discharge Summary  Jamie Mason ZOX:[id number] DOB: 1941/05/25 DOA: 06/26/2016  PCP: Blanch Media, MD  Admit date: 06/26/2016 Discharge date: 06/30/2016  Admitted From: Home Disposition:  SNF   Recommendations for Outpatient Follow-up:  1. Follow up with PCP in 1-2 weeks 2. Please obtain BMP/CBC in 1 week. Watch electrolytes, kidney function (stable at time of discharge), leukocytosis (improving at time of discharge), and hemoglobin (anemic and slowly trending down, but stable).  3. Please follow up on the following pending results: final blood culture results  4. Recommend outpatient sleep study   Home Health: No  Equipment/Devices: None   Discharge Condition: Stable CODE STATUS: Full  Diet recommendation: heart healthy, fluid and salt restriction  Wound: POC aimed at cleansing and moisturizing the bilateral LEs and then dressing wounds with an absorptive product (calcium alginate) topped with a silicone foam.  Following this, we will ask Ortho Tech to apply bilateral Unna's Boots and have scheduled changes for twice weekly. Patient will require either the services of an [facility] or referral to the outpatient wound care center of her choosing for follow up changes and maintainence POC (typically compression hosiery) following discharge SNF.   Brief/Interim Summary: Jamie Mason a 76 y.o.femalewith medical history significant of coronary artery disease, non-STEMI, type 2 diabetes, hyperlipidemia, hypertension, lumbar disc disease, morbid obesity who comes to the emergency department due to chronic lower extremity wounds that she has had for at least 2 months and weakness for the past 3 days. She was admitted for possible cellulitis of bilateral lower extremities as well as acute diastolic heart failure.  She was evaluated by wound nurse for her chronic bilateral LE venous ulcerations. Initially, she was treated with empiric vancomycin and cefepime. Dr. Catha Gosselin spoke with  ID, recommended discontinuing antibiotics and watching, as her wounds were more likely due to chronic venous stasis/insufficiency, without acute infection. Her leukocytosis improved, the patient continued to be afebrile without antibiotics. For acute diastolic heart failure, patient was treated with IV Lasix which was transitioned to oral.   Husband and daughter updated over the phone at time of discharge.   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 Monitor I/Os, weight. May need further dose titration depending on urine output, weight.   Chronic lower extremity wounds, ?cellulitis -Presented with leukocytosis, tachycardia, tachypnea -Questionable cellulitis, could be chronic venous stasis/insufficency related -Lower extremity x-rays unremarkable for fracture or osteomyelitis -Wound care consulted and recommended home health, UNNA boots -Placed on empiric antibioticsvancomycin and cefepime. Dr. Catha Gosselin spoke with ID, recommended discontinuing antibiotics and watching. WBC improved today, afebrile  -Blood cultures still NGTD  Possible sleep apnea -Patient will need outpatient sleep study  Asymptomatic bacteruria -UA showed few bacteria, TNTC WBC, large leukocytes -Patient currently asymptomatic and not complaining of any pyuria or dysuria  Chronic normocytic anemia of chronic disease  -Hemoglobin currently 7.7, was noted to be 11 in 2015, 13 in 2016 -Anemia panel: Iron 26, Ferritin 227 -FOBT negative  -Will need monitoring and further work up as outpatient    Essential hypertension -Continue verapamil, HCTZ  Hyperlipidemia -Continue statin  Lumbar disc disease -Continue pain control as needed  CAD -Continue aspirin, statin -Denies chest pain  Diabetes mellitus, type II -Hemoglobin A1c 7.3 -Metformin held. Continue insulin by scale CBG monitoring. Resume metformin on discharge.   Chronic kidney disease, stage III -Stable   Morbid obesity -BMI 53.4 -Patient will need to follow up with her PCP to discuss lifestyle modifications  Deconditioning -Consulted PT/OT, recommended SNF. Agreeable to go to SNF then have HHRN at home for help.    Discharge Instructions  Discharge Instructions    Diet - low sodium heart healthy    Complete by:  As directed    Increase activity slowly    Complete by:  As directed      Allergies as of 06/30/2016      Reactions   Lisinopril Swelling   Tongue swelling    Ace Inhibitors    Penicillins Itching, Rash   Has patient had a PCN reaction causing immediate rash, facial/tongue/throat swelling, SOB or lightheadedness with hypotension: unknown Has patient had a PCN reaction causing severe rash involving mucus membranes or skin necrosis: unknown Has patient had a PCN reaction that required hospitalization: no Has patient had a PCN reaction occurring within the last 10 years: no If all of the above answers are "NO", then may proceed with Cephalosporin use.      Medication List    STOP taking these medications   atenolol 50 MG tablet Commonly known as:  TENORMIN   ibuprofen 200 MG tablet Commonly known as:  ADVIL,MOTRIN   naproxen sodium 220 MG tablet Commonly known as:  ANAPROX   spironolactone 25 MG tablet Commonly known as:  ALDACTONE     TAKE these medications   albuterol 108 (90 Base) MCG/ACT inhaler Commonly known as:  PROVENTIL HFA;VENTOLIN HFA Inhale 2 puffs into the lungs every 6 (six) hours as needed for wheezing or shortness of breath.   ASPIR-LOW 81 MG EC  tablet Generic drug:  aspirin TAKE 1 TABLET EVERY DAY   atorvastatin 40 MG tablet Commonly known as:  LIPITOR Take 1 tablet (40 mg total) by mouth daily.   furosemide 40 MG tablet Commonly known as:  LASIX Take 1 tablet (40 mg total) by mouth 2 (two) times daily.   hydrocerin Crea Apply to bilateral LEs after cleansing on Mondays and Thursdays (prior to [employer] application).   hydrochlorothiazide 25 MG tablet Commonly known as:  HYDRODIURIL Take 1 tablet (25 mg total) by mouth daily.   metFORMIN 500 MG tablet Commonly known as:  GLUCOPHAGE TAKE 1 TABLET (500 MG TOTAL) BY MOUTH 2 (TWO) TIMES DAILY WITH A MEAL.   verapamil 240 MG CR tablet Commonly known as:  CALAN-SR Take 1 tablet (240 mg total) by mouth 2 (two) times daily.      Follow-up Information    BUTCHER,ELIZABETH, MD. Schedule an appointment as soon as possible for a visit in 1 week(s).   Specialty:  Internal Medicine Contact information: [address] [place] [address] [phone]          Allergies  Allergen Reactions  . Lisinopril Swelling    Tongue swelling   . Ace Inhibitors   . Penicillins Itching and Rash    Has patient had a PCN reaction causing immediate rash, facial/tongue/throat swelling, SOB or lightheadedness with hypotension: unknown Has patient had a PCN reaction causing severe rash involving mucus membranes or skin necrosis: unknown Has patient had a PCN reaction that required hospitalization: no Has patient had a PCN  reaction occurring within the last 10 years: no If all of the above answers are "NO", then may proceed with Cephalosporin use.    Consultations: None   Procedures/Studies: Dg Tibia/fibula Left  Result Date: 06/26/2016 CLINICAL DATA:  bil lower extremity open wounds, flesh degradation, worse distally, pt denies pain, ? Osteomyelitis, denies inj EXAM: LEFT TIBIA AND FIBULA - 2 VIEW COMPARISON:  None. FINDINGS: No fracture. No bone lesion. There are no areas  of bone resorption to suggest osteomyelitis. There is marked narrowing of the medial knee joint space compartment with marginal osteophytes, consistent with osteoarthritis. Ankle joint is normally aligned. There is diffuse subcutaneous soft tissue edema/cellulitis. No soft tissue air. IMPRESSION: 1. No fracture, bone lesion or evidence of osteomyelitis. No soft tissue air. Electronically Signed   By: Amie Portland M.D.   On: 06/26/2016 17:31   Dg Tibia/fibula Right  Result Date: 06/26/2016 CLINICAL DATA:  Open wounds.  Evaluate for osteomyelitis. EXAM: RIGHT TIBIA AND FIBULA - 2 VIEW COMPARISON:  None. FINDINGS: No fracture. No worrisome lytic or sclerotic osseous abnormality. Degenerative changes are noted in the knee. IMPRESSION: Negative. Electronically Signed   By: Kennith Center M.D.   On: 06/26/2016 17:29   Dg Ankle Complete Left  Result Date: 06/26/2016 CLINICAL DATA:  bil lower extremity open wounds, flesh degradation, worse distally, pt denies pain, ? Osteomyelitis, denies inj EXAM: LEFT ANKLE COMPLETE - 3+ VIEW COMPARISON:  None. FINDINGS: No fracture. No bone lesion. There are no areas of bone resorption to suggest osteomyelitis. There are prominent dorsal plantar calcaneal spurs. There is diffuse soft tissue edema/ cellulitis.  No soft tissue air. IMPRESSION: 1. No fracture, dislocation or evidence of osteomyelitis. Electronically Signed   By: Amie Portland M.D.   On: 06/26/2016 17:30   Dg Ankle Complete Right  Result Date: 06/26/2016 CLINICAL DATA:  Open wounds.  Evaluate for osteomyelitis. EXAM: RIGHT ANKLE - COMPLETE 3+ VIEW COMPARISON:  None. FINDINGS: No fracture. No subluxation or dislocation. No gross bony lie cysts in the ankle. Soft tissue edema is noted diffusely. IMPRESSION: No radiographic findings to suggest osteomyelitis. Electronically Signed   By: Kennith Center M.D.   On: 06/26/2016 17:26   Dg Foot Complete Left  Result Date: 06/26/2016 CLINICAL DATA:  bil lower extremity  open wounds, flesh degradation, worse distally, pt denies pain, ? Osteomyelitis, denies inj EXAM: LEFT FOOT - COMPLETE 3+ VIEW COMPARISON:  None. FINDINGS: No fracture. No bone lesion. There are no areas of bone resorption to suggest osteomyelitis. Mild spurring is noted along the dorsal aspect of the midfoot. There is mild asymmetric joint space narrowing of the first metatarsophalangeal joint. These findings are consistent with mild osteoarthritis. There is diffuse soft tissue edema and/or cellulitis. No soft tissue air. There are prominent dorsal plantar calcaneal spurs. IMPRESSION: 1. No fracture, bone lesion or evidence of osteomyelitis. Electronically Signed   By: Amie Portland M.D.   On: 06/26/2016 17:32   Dg Foot Complete Right  Result Date: 06/26/2016 CLINICAL DATA:  Open wounds. EXAM: RIGHT FOOT COMPLETE - 3+ VIEW COMPARISON:  None. FINDINGS: Three views study shows no fracture. No subluxation or dislocation. No definite gross bony destruction to suggest osteomyelitis. IMPRESSION: Negative.MRI without with contrast would be a more sensitive means to investigate for osteomyelitis, as clinically warranted. Electronically Signed   By: Kennith Center M.D.   On: 06/26/2016 17:28    ABI Summary: Unable to ascertain ABIs secondary to large, purulent open wounds on the bilateral lower legs  as well as significant woody edema on the bilateral legs and feet. Bilateral Anterior tibial waveforms are dampened monophasic. Bilateral posterior tibial waveforms not obtained secondary to reason aforementioned.    Echo  Study Conclusions  - Left ventricle: LVEF is normal at 60 to 65% with distal inferior   hypokinesis. The cavity size was normal. Wall thickness was   increased in a pattern of moderate LVH. Systolic function was   normal. The estimated ejection fraction was in the range of 60%   to 65%. Features are consistent with a pseudonormal left   ventricular filling pattern, with concomitant  abnormal relaxation   and increased filling pressure (grade 2 diastolic dysfunction).   Doppler parameters are consistent with high ventricular filling   pressure. - Mitral valve: There was mild regurgitation. - Pulmonary arteries: PA peak pressure: 47 mm Hg (S).   Discharge Exam: Vitals:   06/29/16 2052 06/30/16 0416  BP: 122/69 (!) 115/41  Pulse: 89 84  Resp: 20 (!) 21  Temp: 98 F (36.7 C) 98.4 F (36.9 C)   Vitals:   06/29/16 1223 06/29/16 2052 06/30/16 0416 06/30/16 1019  BP: 90/66 122/69 (!) 115/41   Pulse: 98 89 84   Resp:  20 (!) 21   Temp: 98 F (36.7 C) 98 F (36.7 C) 98.4 F (36.9 C)   TempSrc: Oral Oral Oral   SpO2: 91% 100% 94% 95%  Weight:   (!) 146.8 kg (323 lb 10.2 oz)   Height:        General exam: Appears calm and comfortable  Respiratory system: Clear to auscultation. Upper respiratory stridor, hoarse voice. Respiratory effort normal. Cardiovascular system: S1 & S2 heard, RRR. No JVD, murmurs, rubs, gallops or clicks. No pedal edema. Gastrointestinal system: Abdomen is nondistended, soft and nontender. No organomegaly or masses felt. Normal bowel sounds heard. Central nervous system: Alert and oriented. No focal neurological deficits. Extremities: Symmetric 5 x 5 power. +UNNA boots in place  Skin: No rashes, lesions or ulcers Psychiatry: Judgement and insight appear normal. Mood & affect appropriate.    The results of significant diagnostics from this hospitalization (including imaging, microbiology, ancillary and laboratory) are listed below for reference.     Microbiology: Recent Results (from the past 240 hour(s))  Blood culture (routine x 2)     Status: None (Preliminary result)   Collection Time: 06/26/16  6:36 PM  Result Value Ref Range Status   Specimen Description BLOOD RIGHT HAND  Final   Special Requests BOTTLES DRAWN AEROBIC AND ANAEROBIC 5CC  Final   Culture   Final    NO GROWTH 2 DAYS Performed at [facility], [address] [place], [place] [address]    Report Status PENDING  Incomplete  Blood culture (routine x 2)     Status: None (Preliminary result)   Collection Time: 06/26/16  6:36 PM  Result Value Ref Range Status   Specimen Description BLOOD UPPER ARM  Final   Special Requests BOTTLES DRAWN AEROBIC AND ANAEROBIC 5CC  Final   Culture   Final    NO GROWTH 2 DAYS Performed at [facility], [address] [place], [place] [address]    Report Status PENDING  Incomplete     Labs: BNP (last 3 results)  Recent Labs  06/27/16 1717  BNP 1,283.8*   Basic Metabolic Panel:  Recent Labs Lab 06/26/16 1836 06/27/16 0509 06/28/16 0615 06/29/16 0609 06/30/16 0601  NA 140 141 140 139 139  K 3.6 3.6 3.4* 3.5 3.6  CL 102 107 107 104 104  CO2 29 26 23 26 26   GLUCOSE 162* 123* 120* 142* 121*  BUN 17 17 21* 25* 29*  CREATININE 1.23* 1.07* 1.33* 1.22* 1.23*  CALCIUM 8.4* 8.1* 8.0* 8.3* 8.4*  MG 1.8  --   --   --   --    Liver Function Tests:  Recent Labs Lab 06/26/16 1836 06/27/16 0509  AST 97* 74*  ALT 29 25  ALKPHOS 62 53  BILITOT 0.7 0.7  PROT 7.4 6.4*  ALBUMIN 2.6* 2.2*   No results for input(s): LIPASE, AMYLASE in the last 168 hours. No results for input(s): AMMONIA in the last 168 hours. CBC:  Recent Labs Lab 06/26/16 1836 06/27/16 0509 06/28/16 0615 06/29/16 0609 06/30/16 0601  WBC 16.5* 16.6* 16.3* 15.2* 12.4*  NEUTROABS 13.1* 12.7* 10.7* 10.6* 7.9*  HGB 9.3* 8.6* 8.2* 8.3* 7.7*  HCT 28.9* 26.4* 24.6* 24.9* 24.2*  MCV 87.8 87.4 87.2 87.4 88.6  PLT 365 351 345 353 387   Cardiac Enzymes: No results for input(s): CKTOTAL, CKMB, CKMBINDEX, TROPONINI in the last 168 hours. BNP: Invalid input(s): POCBNP CBG:  Recent Labs Lab 06/29/16 0738 06/29/16 1136 06/29/16 1629 06/29/16 2108 06/30/16 0741  GLUCAP 127* 130* 125* 123* 122*   D-Dimer No results for input(s): DDIMER in the last 72 hours. Hgb A1c No results for input(s): HGBA1C in the last 72  hours. Lipid Profile No results for input(s): CHOL, HDL, LDLCALC, TRIG, CHOLHDL, LDLDIRECT in the last 72 hours. Thyroid function studies No results for input(s): TSH, T4TOTAL, T3FREE, THYROIDAB in the last 72 hours.  Invalid input(s): FREET3 Anemia work up  Recent Labs  06/28/16 0615 06/28/16 0621  VITAMINB12  --  420  FOLATE  --  7.7  FERRITIN  --  227  TIBC  --  197*  IRON  --  26*  RETICCTPCT 2.9  --    Urinalysis    Component Value Date/Time   COLORURINE YELLOW 06/27/2016 1221   APPEARANCEUR CLOUDY (A) 06/27/2016 1221   LABSPEC 1.021 06/27/2016 1221   PHURINE 5.0 06/27/2016 1221   GLUCOSEU NEGATIVE 06/27/2016 1221   HGBUR SMALL (A) 06/27/2016 1221   BILIRUBINUR NEGATIVE 06/27/2016 1221   BILIRUBINUR Negative 03/19/2014 1631   KETONESUR NEGATIVE 06/27/2016 1221   PROTEINUR 30 (A) 06/27/2016 1221   UROBILINOGEN 0.2 03/19/2014 1631   NITRITE NEGATIVE 06/27/2016 1221   LEUKOCYTESUR LARGE (A) 06/27/2016 1221   Sepsis Labs Invalid input(s): PROCALCITONIN,  WBC,  LACTICIDVEN Microbiology Recent Results (from the past 240 hour(s))  Blood culture (routine x 2)     Status: None (Preliminary result)   Collection Time: 06/26/16  6:36 PM  Result Value Ref Range Status   Specimen Description BLOOD RIGHT HAND  Final   Special Requests BOTTLES DRAWN AEROBIC AND ANAEROBIC 5CC  Final   Culture   Final    NO GROWTH 2 DAYS Performed at [facility], [address] [place], [place] [address]    Report Status PENDING  Incomplete  Blood culture (routine x 2)     Status: None (Preliminary result)   Collection Time: 06/26/16  6:36 PM  Result Value Ref Range Status   Specimen Description BLOOD UPPER ARM  Final   Special Requests BOTTLES DRAWN AEROBIC AND ANAEROBIC 5CC  Final   Culture   Final    NO GROWTH 2 DAYS Performed at [facility], [address] [place], [place]  [address]    Report Status PENDING  Incomplete     Time coordinating discharge: Over 30  minutes  SIGNED:  Noralee Stain, DO [facility] Pager [phone]  If 7PM-7AM, please contact night-coverage [email or web address] Password [facility] 06/30/2016, 10:43 AM

## 2016-06-30 NOTE — Care Management Note (Signed)
Case Management Note  Patient Details  Name: Jamie Mason MRN: [id number] Date of Birth: 02/17/1941  Subjective/Objective:                    Action/Plan:D/C SNF.   Expected Discharge Date:  06/30/16               Expected Discharge Plan:  Skilled Nursing Facility  In-House Referral:  Clinical Social Work  Discharge planning Services  CM Consult  Post Acute Care Choice:    Choice offered to:     DME Arranged:    DME Agency:     HH Arranged:    HH Agency:     Status of Service:  Completed, signed off  If discussed at [employer] Meetings, dates discussed:    Additional Comments:  Lanier ClamMahabir, Immaculate Crutcher, RN 06/30/2016, 11:10 AM

## 2016-06-30 NOTE — Progress Notes (Signed)
Insurance authorization obtained. [facility] called for transport. Patient's husband, [place] & daughter, Delana MeyerFlorestine made aware. RN, Annabelle Harmanana aware.    Lincoln MaxinKelly Shanautica Forker, LCSW [facility] Clinical Social Worker cell #: [phone]

## 2016-06-30 NOTE — Progress Notes (Signed)
Report called to Shanda BumpsJessica at [facility]. Jamie Harmanark, Jamie Mason

## 2016-07-01 DIAGNOSIS — E1122 Type 2 diabetes mellitus with diabetic chronic kidney disease: Secondary | ICD-10-CM | POA: Diagnosis not present

## 2016-07-01 DIAGNOSIS — S81809D Unspecified open wound, unspecified lower leg, subsequent encounter: Secondary | ICD-10-CM | POA: Diagnosis not present

## 2016-07-01 DIAGNOSIS — I5033 Acute on chronic diastolic (congestive) heart failure: Secondary | ICD-10-CM | POA: Diagnosis not present

## 2016-07-02 LAB — CULTURE, BLOOD (ROUTINE X 2)
CULTURE: NO GROWTH
Culture: NO GROWTH

## 2016-07-04 DIAGNOSIS — R0989 Other specified symptoms and signs involving the circulatory and respiratory systems: Secondary | ICD-10-CM | POA: Diagnosis not present

## 2016-07-04 DIAGNOSIS — I509 Heart failure, unspecified: Secondary | ICD-10-CM | POA: Diagnosis not present

## 2016-07-04 DIAGNOSIS — E1122 Type 2 diabetes mellitus with diabetic chronic kidney disease: Secondary | ICD-10-CM | POA: Diagnosis not present

## 2016-07-04 DIAGNOSIS — R5381 Other malaise: Secondary | ICD-10-CM | POA: Diagnosis not present

## 2016-07-04 DIAGNOSIS — R05 Cough: Secondary | ICD-10-CM | POA: Diagnosis not present

## 2016-07-05 DIAGNOSIS — R5381 Other malaise: Secondary | ICD-10-CM | POA: Diagnosis not present

## 2016-07-05 DIAGNOSIS — R062 Wheezing: Secondary | ICD-10-CM | POA: Diagnosis not present

## 2016-07-05 DIAGNOSIS — R05 Cough: Secondary | ICD-10-CM | POA: Diagnosis not present

## 2016-07-05 DIAGNOSIS — J189 Pneumonia, unspecified organism: Secondary | ICD-10-CM | POA: Diagnosis not present

## 2016-07-06 ENCOUNTER — Encounter (HOSPITAL_COMMUNITY): Payer: Self-pay | Admitting: Emergency Medicine

## 2016-07-06 ENCOUNTER — Emergency Department (HOSPITAL_COMMUNITY): Payer: Medicare HMO

## 2016-07-06 ENCOUNTER — Inpatient Hospital Stay (HOSPITAL_COMMUNITY)
Admission: EM | Admit: 2016-07-06 | Discharge: 2016-07-16 | DRG: 871 | Disposition: A | Discharge status: Home Health | Payer: Medicare HMO | Attending: Internal Medicine | Admitting: Internal Medicine

## 2016-07-06 DIAGNOSIS — Z7901 Long term (current) use of anticoagulants: Secondary | ICD-10-CM | POA: Diagnosis not present

## 2016-07-06 DIAGNOSIS — R748 Abnormal levels of other serum enzymes: Secondary | ICD-10-CM | POA: Diagnosis not present

## 2016-07-06 DIAGNOSIS — J1000 Influenza due to other identified influenza virus with unspecified type of pneumonia: Secondary | ICD-10-CM | POA: Diagnosis not present

## 2016-07-06 DIAGNOSIS — I248 Other forms of acute ischemic heart disease: Secondary | ICD-10-CM | POA: Diagnosis present

## 2016-07-06 DIAGNOSIS — R778 Other specified abnormalities of plasma proteins: Secondary | ICD-10-CM

## 2016-07-06 DIAGNOSIS — I480 Paroxysmal atrial fibrillation: Secondary | ICD-10-CM | POA: Diagnosis not present

## 2016-07-06 DIAGNOSIS — Z7982 Long term (current) use of aspirin: Secondary | ICD-10-CM

## 2016-07-06 DIAGNOSIS — Z823 Family history of stroke: Secondary | ICD-10-CM

## 2016-07-06 DIAGNOSIS — I130 Hypertensive heart and chronic kidney disease with heart failure and stage 1 through stage 4 chronic kidney disease, or unspecified chronic kidney disease: Secondary | ICD-10-CM | POA: Diagnosis present

## 2016-07-06 DIAGNOSIS — N183 Chronic kidney disease, stage 3 (moderate): Secondary | ICD-10-CM | POA: Diagnosis present

## 2016-07-06 DIAGNOSIS — E1122 Type 2 diabetes mellitus with diabetic chronic kidney disease: Secondary | ICD-10-CM | POA: Diagnosis not present

## 2016-07-06 DIAGNOSIS — I5033 Acute on chronic diastolic (congestive) heart failure: Secondary | ICD-10-CM | POA: Diagnosis not present

## 2016-07-06 DIAGNOSIS — R000 Tachycardia, unspecified: Secondary | ICD-10-CM | POA: Diagnosis present

## 2016-07-06 DIAGNOSIS — R7989 Other specified abnormal findings of blood chemistry: Secondary | ICD-10-CM

## 2016-07-06 DIAGNOSIS — I4892 Unspecified atrial flutter: Secondary | ICD-10-CM | POA: Diagnosis present

## 2016-07-06 DIAGNOSIS — Z87891 Personal history of nicotine dependence: Secondary | ICD-10-CM

## 2016-07-06 DIAGNOSIS — L89213 Pressure ulcer of right hip, stage 3: Secondary | ICD-10-CM | POA: Diagnosis not present

## 2016-07-06 DIAGNOSIS — J111 Influenza due to unidentified influenza virus with other respiratory manifestations: Secondary | ICD-10-CM

## 2016-07-06 DIAGNOSIS — Z7984 Long term (current) use of oral hypoglycemic drugs: Secondary | ICD-10-CM

## 2016-07-06 DIAGNOSIS — L8990 Pressure ulcer of unspecified site, unspecified stage: Secondary | ICD-10-CM | POA: Diagnosis not present

## 2016-07-06 DIAGNOSIS — A419 Sepsis, unspecified organism: Secondary | ICD-10-CM | POA: Diagnosis not present

## 2016-07-06 DIAGNOSIS — R0600 Dyspnea, unspecified: Secondary | ICD-10-CM | POA: Diagnosis not present

## 2016-07-06 DIAGNOSIS — N390 Urinary tract infection, site not specified: Secondary | ICD-10-CM | POA: Diagnosis present

## 2016-07-06 DIAGNOSIS — Y92230 Patient room in hospital as the place of occurrence of the external cause: Secondary | ICD-10-CM | POA: Diagnosis not present

## 2016-07-06 DIAGNOSIS — R0902 Hypoxemia: Secondary | ICD-10-CM | POA: Diagnosis not present

## 2016-07-06 DIAGNOSIS — T380X5A Adverse effect of glucocorticoids and synthetic analogues, initial encounter: Secondary | ICD-10-CM | POA: Diagnosis not present

## 2016-07-06 DIAGNOSIS — Z6841 Body Mass Index (BMI) 40.0 and over, adult: Secondary | ICD-10-CM | POA: Diagnosis not present

## 2016-07-06 DIAGNOSIS — J9621 Acute and chronic respiratory failure with hypoxia: Secondary | ICD-10-CM | POA: Diagnosis not present

## 2016-07-06 DIAGNOSIS — E669 Obesity, unspecified: Secondary | ICD-10-CM | POA: Diagnosis not present

## 2016-07-06 DIAGNOSIS — I25118 Atherosclerotic heart disease of native coronary artery with other forms of angina pectoris: Secondary | ICD-10-CM | POA: Diagnosis not present

## 2016-07-06 DIAGNOSIS — I10 Essential (primary) hypertension: Secondary | ICD-10-CM | POA: Diagnosis not present

## 2016-07-06 DIAGNOSIS — T45515A Adverse effect of anticoagulants, initial encounter: Secondary | ICD-10-CM | POA: Diagnosis not present

## 2016-07-06 DIAGNOSIS — L89893 Pressure ulcer of other site, stage 3: Secondary | ICD-10-CM | POA: Diagnosis present

## 2016-07-06 DIAGNOSIS — J9600 Acute respiratory failure, unspecified whether with hypoxia or hypercapnia: Secondary | ICD-10-CM | POA: Diagnosis not present

## 2016-07-06 DIAGNOSIS — R319 Hematuria, unspecified: Secondary | ICD-10-CM | POA: Diagnosis present

## 2016-07-06 DIAGNOSIS — E11622 Type 2 diabetes mellitus with other skin ulcer: Secondary | ICD-10-CM | POA: Diagnosis present

## 2016-07-06 DIAGNOSIS — R069 Unspecified abnormalities of breathing: Secondary | ICD-10-CM | POA: Diagnosis not present

## 2016-07-06 DIAGNOSIS — Z880 Allergy status to penicillin: Secondary | ICD-10-CM

## 2016-07-06 DIAGNOSIS — Z79899 Other long term (current) drug therapy: Secondary | ICD-10-CM

## 2016-07-06 DIAGNOSIS — R0603 Acute respiratory distress: Secondary | ICD-10-CM

## 2016-07-06 DIAGNOSIS — Z888 Allergy status to other drugs, medicaments and biological substances status: Secondary | ICD-10-CM

## 2016-07-06 DIAGNOSIS — I2510 Atherosclerotic heart disease of native coronary artery without angina pectoris: Secondary | ICD-10-CM | POA: Diagnosis present

## 2016-07-06 DIAGNOSIS — N182 Chronic kidney disease, stage 2 (mild): Secondary | ICD-10-CM

## 2016-07-06 DIAGNOSIS — I509 Heart failure, unspecified: Secondary | ICD-10-CM | POA: Diagnosis not present

## 2016-07-06 DIAGNOSIS — I499 Cardiac arrhythmia, unspecified: Secondary | ICD-10-CM | POA: Diagnosis not present

## 2016-07-06 DIAGNOSIS — D649 Anemia, unspecified: Secondary | ICD-10-CM | POA: Diagnosis not present

## 2016-07-06 DIAGNOSIS — I4891 Unspecified atrial fibrillation: Secondary | ICD-10-CM | POA: Diagnosis not present

## 2016-07-06 DIAGNOSIS — I214 Non-ST elevation (NSTEMI) myocardial infarction: Secondary | ICD-10-CM | POA: Diagnosis not present

## 2016-07-06 DIAGNOSIS — R0602 Shortness of breath: Secondary | ICD-10-CM | POA: Diagnosis not present

## 2016-07-06 DIAGNOSIS — I493 Ventricular premature depolarization: Secondary | ICD-10-CM | POA: Diagnosis present

## 2016-07-06 DIAGNOSIS — E1165 Type 2 diabetes mellitus with hyperglycemia: Secondary | ICD-10-CM | POA: Diagnosis not present

## 2016-07-06 DIAGNOSIS — E785 Hyperlipidemia, unspecified: Secondary | ICD-10-CM | POA: Diagnosis present

## 2016-07-06 DIAGNOSIS — J1100 Influenza due to unidentified influenza virus with unspecified type of pneumonia: Secondary | ICD-10-CM | POA: Diagnosis not present

## 2016-07-06 DIAGNOSIS — N179 Acute kidney failure, unspecified: Secondary | ICD-10-CM | POA: Diagnosis not present

## 2016-07-06 DIAGNOSIS — J9620 Acute and chronic respiratory failure, unspecified whether with hypoxia or hypercapnia: Secondary | ICD-10-CM | POA: Diagnosis not present

## 2016-07-06 HISTORY — DX: Unspecified atrial flutter: I48.92

## 2016-07-06 LAB — BASIC METABOLIC PANEL
ANION GAP: 18 — AB (ref 5–15)
Anion gap: 18 — ABNORMAL HIGH (ref 5–15)
BUN: 22 mg/dL — ABNORMAL HIGH (ref 6–20)
BUN: 23 mg/dL — ABNORMAL HIGH (ref 6–20)
CALCIUM: 8.5 mg/dL — AB (ref 8.9–10.3)
CHLORIDE: 94 mmol/L — AB (ref 101–111)
CO2: 23 mmol/L (ref 22–32)
CO2: 24 mmol/L (ref 22–32)
CREATININE: 1.95 mg/dL — AB (ref 0.44–1.00)
Calcium: 8.3 mg/dL — ABNORMAL LOW (ref 8.9–10.3)
Chloride: 96 mmol/L — ABNORMAL LOW (ref 101–111)
Creatinine, Ser: 2.12 mg/dL — ABNORMAL HIGH (ref 0.44–1.00)
GFR, EST AFRICAN AMERICAN: 28 mL/min — AB (ref >60)
GFR, EST AFRICAN AMERICAN: 25 mL/min — AB (ref >60)
GFR, EST NON AFRICAN AMERICAN: 24 mL/min — AB (ref >60)
GFR, EST NON AFRICAN AMERICAN: 22 mL/min — AB (ref >60)
Glucose, Bld: 207 mg/dL — ABNORMAL HIGH (ref 65–99)
Glucose, Bld: 233 mg/dL — ABNORMAL HIGH (ref 65–99)
POTASSIUM: 3.4 mmol/L — AB (ref 3.5–5.1)
Potassium: 4.2 mmol/L (ref 3.5–5.1)
SODIUM: 135 mmol/L (ref 135–145)
Sodium: 138 mmol/L (ref 135–145)

## 2016-07-06 LAB — I-STAT ARTERIAL BLOOD GAS, ED
ACID-BASE EXCESS: 5 mmol/L — AB (ref 0.0–2.0)
Bicarbonate: 30.4 mmol/L — ABNORMAL HIGH (ref 20.0–28.0)
O2 SAT: 100 %
TCO2: 32 mmol/L (ref 0–100)
pCO2 arterial: 52.8 mmHg — ABNORMAL HIGH (ref 32.0–48.0)
pH, Arterial: 7.377 (ref 7.350–7.450)
pO2, Arterial: 185 mmHg — ABNORMAL HIGH (ref 83.0–108.0)

## 2016-07-06 LAB — CBC WITH DIFFERENTIAL/PLATELET
BASOS ABS: 0 10*3/uL (ref 0.0–0.1)
Basophils Relative: 0 %
EOS ABS: 0.1 10*3/uL (ref 0.0–0.7)
Eosinophils Relative: 1 %
HCT: 29.8 % — ABNORMAL LOW (ref 36.0–46.0)
HEMOGLOBIN: 9.6 g/dL — AB (ref 12.0–15.0)
LYMPHS PCT: 39 %
Lymphs Abs: 4.9 10*3/uL — ABNORMAL HIGH (ref 0.7–4.0)
MCH: 28.4 pg (ref 26.0–34.0)
MCHC: 32.2 g/dL (ref 30.0–36.0)
MCV: 88.2 fL (ref 78.0–100.0)
Monocytes Absolute: 1 10*3/uL (ref 0.1–1.0)
Monocytes Relative: 8 %
NEUTROS ABS: 6.5 10*3/uL (ref 1.7–7.7)
Neutrophils Relative %: 52 %
Platelets: 367 10*3/uL (ref 150–400)
RBC: 3.38 MIL/uL — ABNORMAL LOW (ref 3.87–5.11)
RDW: 15.8 % — AB (ref 11.5–15.5)
WBC: 12.5 10*3/uL — ABNORMAL HIGH (ref 4.0–10.5)

## 2016-07-06 LAB — GLUCOSE, CAPILLARY
GLUCOSE-CAPILLARY: 267 mg/dL — AB (ref 65–99)
Glucose-Capillary: 254 mg/dL — ABNORMAL HIGH (ref 65–99)
Glucose-Capillary: 273 mg/dL — ABNORMAL HIGH (ref 65–99)
Glucose-Capillary: 174 mg/dL — ABNORMAL HIGH (ref 65–99)

## 2016-07-06 LAB — HEPARIN LEVEL (UNFRACTIONATED): Heparin Unfractionated: 0.77 IU/mL — ABNORMAL HIGH (ref 0.30–0.70)

## 2016-07-06 LAB — BLOOD GAS, ARTERIAL
ACID-BASE EXCESS: 2.1 mmol/L — AB (ref 0.0–2.0)
Bicarbonate: 26.4 mmol/L (ref 20.0–28.0)
Delivery systems: POSITIVE
Drawn by: 313941
EXPIRATORY PAP: 6
FIO2: 60
INSPIRATORY PAP: 20
O2 SAT: 98.2 %
PATIENT TEMPERATURE: 99.7
PCO2 ART: 44 mmHg (ref 32.0–48.0)
PO2 ART: 123 mmHg — AB (ref 83.0–108.0)
RATE: 10 resp/min
pH, Arterial: 7.399 (ref 7.350–7.450)

## 2016-07-06 LAB — PROCALCITONIN

## 2016-07-06 LAB — CBC
HEMATOCRIT: 28.5 % — AB (ref 36.0–46.0)
Hemoglobin: 9.3 g/dL — ABNORMAL LOW (ref 12.0–15.0)
MCH: 28.5 pg (ref 26.0–34.0)
MCHC: 32.6 g/dL (ref 30.0–36.0)
MCV: 87.4 fL (ref 78.0–100.0)
Platelets: 370 10*3/uL (ref 150–400)
RBC: 3.26 MIL/uL — AB (ref 3.87–5.11)
RDW: 15.7 % — ABNORMAL HIGH (ref 11.5–15.5)
WBC: 16.2 10*3/uL — AB (ref 4.0–10.5)

## 2016-07-06 LAB — I-STAT TROPONIN, ED: TROPONIN I, POC: 6.22 ng/mL — AB (ref 0.00–0.08)

## 2016-07-06 LAB — I-STAT CG4 LACTIC ACID, ED: LACTIC ACID, VENOUS: 4.49 mmol/L — AB (ref 0.5–1.9)

## 2016-07-06 LAB — TROPONIN I
TROPONIN I: 3.18 ng/mL — AB (ref <0.03)
TROPONIN I: 2.23 ng/mL — AB (ref <0.03)
Troponin I: 1.93 ng/mL (ref <0.03)

## 2016-07-06 LAB — MRSA PCR SCREENING: MRSA BY PCR: NEGATIVE

## 2016-07-06 LAB — HEPATIC FUNCTION PANEL
ALT: 30 U/L (ref 14–54)
AST: 76 U/L — ABNORMAL HIGH (ref 15–41)
Albumin: 2.7 g/dL — ABNORMAL LOW (ref 3.5–5.0)
Alkaline Phosphatase: 58 U/L (ref 38–126)
BILIRUBIN DIRECT: 0.2 mg/dL (ref 0.1–0.5)
BILIRUBIN INDIRECT: 0.3 mg/dL (ref 0.3–0.9)
Total Bilirubin: 0.5 mg/dL (ref 0.3–1.2)
Total Protein: 7.8 g/dL (ref 6.5–8.1)

## 2016-07-06 LAB — CBG MONITORING, ED: GLUCOSE-CAPILLARY: 254 mg/dL — AB (ref 65–99)

## 2016-07-06 LAB — LACTIC ACID, PLASMA
LACTIC ACID, VENOUS: 5 mmol/L — AB (ref 0.5–1.9)
Lactic Acid, Venous: 4.9 mmol/L (ref 0.5–1.9)

## 2016-07-06 LAB — BRAIN NATRIURETIC PEPTIDE: B Natriuretic Peptide: 731.2 pg/mL — ABNORMAL HIGH (ref 0.0–100.0)

## 2016-07-06 LAB — INFLUENZA PANEL BY PCR (TYPE A & B)
Influenza A By PCR: POSITIVE — AB
Influenza B By PCR: NEGATIVE

## 2016-07-06 MED ORDER — CHLORHEXIDINE GLUCONATE 0.12 % MT SOLN
15.0000 mL | Freq: Two times a day (BID) | OROMUCOSAL | Status: DC
  Administered 2016-07-06 – 2016-07-16 (×21): 15 mL via OROMUCOSAL
  Filled 2016-07-06 (×18): qty 15

## 2016-07-06 MED ORDER — INSULIN ASPART 100 UNIT/ML ~~LOC~~ SOLN
0.0000 [IU] | SUBCUTANEOUS | Status: DC
  Administered 2016-07-06 (×2): 8 [IU] via SUBCUTANEOUS
  Administered 2016-07-06: 3 [IU] via SUBCUTANEOUS
  Administered 2016-07-06 (×2): 8 [IU] via SUBCUTANEOUS
  Administered 2016-07-07: 3 [IU] via SUBCUTANEOUS
  Administered 2016-07-07: 2 [IU] via SUBCUTANEOUS
  Administered 2016-07-07 (×2): 5 [IU] via SUBCUTANEOUS
  Administered 2016-07-07: 3 [IU] via SUBCUTANEOUS
  Filled 2016-07-06: qty 1

## 2016-07-06 MED ORDER — SODIUM CHLORIDE 0.9 % IV BOLUS (SEPSIS)
1000.0000 mL | Freq: Once | INTRAVENOUS | Status: DC
  Administered 2016-07-06: 1000 mL via INTRAVENOUS

## 2016-07-06 MED ORDER — ATORVASTATIN CALCIUM 40 MG PO TABS
40.0000 mg | ORAL_TABLET | Freq: Every day | ORAL | Status: DC
  Administered 2016-07-06 – 2016-07-16 (×10): 40 mg via ORAL
  Filled 2016-07-06 (×11): qty 1

## 2016-07-06 MED ORDER — VANCOMYCIN HCL 10 G IV SOLR: 1750.0000 mg | INTRAVENOUS | Status: DC

## 2016-07-06 MED ORDER — ASPIRIN 300 MG RE SUPP
300.0000 mg | Freq: Once | RECTAL | Status: AC
  Administered 2016-07-06: 300 mg via RECTAL
  Filled 2016-07-06: qty 1

## 2016-07-06 MED ORDER — SODIUM CHLORIDE 0.9 % IV BOLUS (SEPSIS): 1000.0000 mL | Freq: Once | INTRAVENOUS | Status: DC

## 2016-07-06 MED ORDER — ACETAMINOPHEN 650 MG RE SUPP
650.0000 mg | Freq: Once | RECTAL | Status: AC
Start: 2016-07-06 — End: 2016-07-06
  Administered 2016-07-06: 650 mg via RECTAL
  Filled 2016-07-06: qty 1

## 2016-07-06 MED ORDER — ASPIRIN EC 81 MG PO TBEC
81.0000 mg | DELAYED_RELEASE_TABLET | Freq: Every day | ORAL | Status: DC
  Administered 2016-07-06 – 2016-07-16 (×11): 81 mg via ORAL
  Filled 2016-07-06 (×11): qty 1

## 2016-07-06 MED ORDER — DEXTROSE 5 % IV SOLN
1.0000 g | Freq: Three times a day (TID) | INTRAVENOUS | Status: DC
  Administered 2016-07-06 – 2016-07-07 (×3): 1 g via INTRAVENOUS
  Filled 2016-07-06 (×4): qty 1

## 2016-07-06 MED ORDER — ALBUTEROL (5 MG/ML) CONTINUOUS INHALATION SOLN
10.0000 mg/h | INHALATION_SOLUTION | Freq: Once | RESPIRATORY_TRACT | Status: AC
  Administered 2016-07-06: 10 mg/h via RESPIRATORY_TRACT
  Filled 2016-07-06: qty 20

## 2016-07-06 MED ORDER — ACETAMINOPHEN 325 MG PO TABS: 650.0000 mg | ORAL_TABLET | ORAL | Status: DC | PRN

## 2016-07-06 MED ORDER — LEVOFLOXACIN IN D5W 750 MG/150ML IV SOLN: 750.0000 mg | INTRAVENOUS | Status: DC

## 2016-07-06 MED ORDER — ONDANSETRON HCL 4 MG/2ML IJ SOLN: 4.0000 mg | Freq: Four times a day (QID) | INTRAMUSCULAR | Status: DC | PRN

## 2016-07-06 MED ORDER — METHYLPREDNISOLONE SODIUM SUCC 40 MG IJ SOLR
40.0000 mg | Freq: Two times a day (BID) | INTRAMUSCULAR | Status: DC
  Administered 2016-07-06 – 2016-07-11 (×11): 40 mg via INTRAVENOUS
  Filled 2016-07-06 (×11): qty 1

## 2016-07-06 MED ORDER — ORAL CARE MOUTH RINSE
15.0000 mL | Freq: Two times a day (BID) | OROMUCOSAL | Status: DC
  Administered 2016-07-06 – 2016-07-15 (×10): 15 mL via OROMUCOSAL

## 2016-07-06 MED ORDER — HEPARIN (PORCINE) IN NACL 100-0.45 UNIT/ML-% IJ SOLN
1050.0000 [IU]/h | INTRAMUSCULAR | Status: DC
  Administered 2016-07-06: 1200 [IU]/h via INTRAVENOUS
  Administered 2016-07-06: 1300 [IU]/h via INTRAVENOUS
  Administered 2016-07-07: 1050 [IU]/h via INTRAVENOUS
  Filled 2016-07-06 (×3): qty 250

## 2016-07-06 MED ORDER — OSELTAMIVIR PHOSPHATE 30 MG PO CAPS
30.0000 mg | ORAL_CAPSULE | Freq: Two times a day (BID) | ORAL | Status: AC
  Administered 2016-07-06 – 2016-07-10 (×10): 30 mg via ORAL
  Filled 2016-07-06 (×10): qty 1

## 2016-07-06 MED ORDER — HEPARIN SODIUM (PORCINE) 5000 UNIT/ML IJ SOLN: 5000.0000 [IU] | Freq: Three times a day (TID) | INTRAMUSCULAR | Status: DC

## 2016-07-06 MED ORDER — IPRATROPIUM-ALBUTEROL 0.5-2.5 (3) MG/3ML IN SOLN
3.0000 mL | Freq: Four times a day (QID) | RESPIRATORY_TRACT | Status: DC
  Administered 2016-07-06 – 2016-07-07 (×6): 3 mL via RESPIRATORY_TRACT
  Filled 2016-07-06 (×5): qty 3

## 2016-07-06 MED ORDER — SODIUM CHLORIDE 0.9 % IV SOLN: 250.0000 mL | INTRAVENOUS | Status: DC | PRN

## 2016-07-06 MED ORDER — ALBUTEROL SULFATE (2.5 MG/3ML) 0.083% IN NEBU
2.5000 mg | INHALATION_SOLUTION | RESPIRATORY_TRACT | Status: DC | PRN
  Administered 2016-07-06: 2.5 mg via RESPIRATORY_TRACT
  Filled 2016-07-06: qty 3

## 2016-07-06 MED ORDER — OSELTAMIVIR PHOSPHATE 30 MG PO CAPS
30.0000 mg | ORAL_CAPSULE | Freq: Every day | ORAL | Status: DC
  Filled 2016-07-06: qty 1

## 2016-07-06 MED ORDER — DEXTROSE 5 % IV SOLN
2.0000 g | Freq: Once | INTRAVENOUS | Status: AC
  Administered 2016-07-06: 2 g via INTRAVENOUS
  Filled 2016-07-06: qty 2

## 2016-07-06 MED ORDER — POTASSIUM CHLORIDE 20 MEQ/15ML (10%) PO SOLN
20.0000 meq | Freq: Once | ORAL | Status: AC
  Administered 2016-07-06: 20 meq via ORAL
  Filled 2016-07-06: qty 15

## 2016-07-06 MED ORDER — FUROSEMIDE 10 MG/ML IJ SOLN
60.0000 mg | Freq: Once | INTRAMUSCULAR | Status: AC
  Administered 2016-07-06: 60 mg via INTRAVENOUS
  Filled 2016-07-06: qty 6

## 2016-07-06 MED ORDER — VANCOMYCIN HCL 10 G IV SOLR
1500.0000 mg | Freq: Once | INTRAVENOUS | Status: AC
  Administered 2016-07-06: 1500 mg via INTRAVENOUS
  Filled 2016-07-06: qty 1500

## 2016-07-06 MED ORDER — VANCOMYCIN HCL IN DEXTROSE 1-5 GM/200ML-% IV SOLN: 1000.0000 mg | Freq: Once | INTRAVENOUS | Status: DC

## 2016-07-06 MED ORDER — HEPARIN BOLUS VIA INFUSION
4000.0000 [IU] | Freq: Once | INTRAVENOUS | Status: AC
  Administered 2016-07-06: 4000 [IU] via INTRAVENOUS
  Filled 2016-07-06: qty 4000

## 2016-07-06 MED ORDER — LEVOFLOXACIN IN D5W 750 MG/150ML IV SOLN
750.0000 mg | Freq: Once | INTRAVENOUS | Status: AC
  Administered 2016-07-06: 750 mg via INTRAVENOUS
  Filled 2016-07-06: qty 150

## 2016-07-06 NOTE — Progress Notes (Signed)
Pt titrated to a [place], no distress noted at this time.

## 2016-07-06 NOTE — ED Notes (Signed)
[facility]. EMS called out for respiratory distress. PT was 80% on RA. 92-93% on CPAP. PT received 2 mag, 125 solumedrol, 1 Atrovent, and 10 albuterol. Pt has a history of A fib, CHF, and was recently diagnosed with bronchitis. Pt has bilateral rhonchi. Pt hypertensive with EMS.   146/120 114 HR 34-36 HR 93% CPAP  22G LH

## 2016-07-06 NOTE — Progress Notes (Signed)
Inpatient Diabetes Program Recommendations  AACE/ADA: New Consensus Statement on Inpatient Glycemic Control (2015)  Target Ranges:  Prepandial:   less than 140 mg/dL      Peak postprandial:   less than 180 mg/dL (1-2 hours)      Critically ill patients:  140 - 180 mg/dL   Lab Results  Component Value Date   GLUCAP 267 (H) 07/06/2016   HGBA1C 7.3 (H) 06/27/2016    Review of Glycemic Control:  Results for [name], Jamie Mason (MRN [id number]) as of 07/06/2016 10:26  Ref. Range 07/06/2016 05:16 07/06/2016 08:18  Glucose-Capillary Latest Ref Range: 65 - 99 mg/dL 409254 (H) 811267 (H)   Diabetes history: Type 2 diabetes Outpatient Diabetes medications: Metformin 500 mg bid Current orders for Inpatient glycemic control:  Solumedrol 40 mg IV q 12 hours, Novolog moderate q 4 hours  Inpatient Diabetes Program Recommendations:    May consider ICU glycemic control order set if appropriate.  Thanks, Beryl MeagerJenny Dawnita Molner, RN, BC-ADM Inpatient Diabetes Coordinator Pager [phone] (8a-5p)

## 2016-07-06 NOTE — H&P (Signed)
Name: Jamie Mason MRN: [id number] DOB: 11-Oct-1940    LOS: 0  PCCM ADMISSION NOTE  History of Present Illness: Patient is a 76 yo F with pmhx significant for dCHF, morbid obesity, who presents today with SOB. She was recently admitted to [facility] 1/14 for CHF exacerbation and cellulitis from her chronic lower extremities wounds. She improved with diuresis and antibiotics. Today she was brought in via EMS from her nursing home for respiratory distress. Patient said she developed a cough yesterday and was diagnosed with bronchitis. She was treated with antibiotics. Today she began having difficulty breathing and nursing staff called 911. On EMS arrival her oxygen saturation was in the 80s. She was initially placed on a non re breather and then a CPAP with improvement in SaO2 to 92-93%. She was given 10 mg albuterol, 1mg  atrovent, 2g magnesium and 125mg  IV solumedrol en route.   In the ED she was febrile to 102.5 and hemodynamically stable (BP 121/63, HR 107, RR 25, Oxygen 100% on BiPAP).  She was alert and oriented and able to answer all questions. She denied any fevers, weight gain, N/V/D. She did endorse this SOB feeling similar to other CHF exacerbations. Labs were significant for a mild leukocytosis 12.5, elevated creatinine 1.95 (baseline ~1.1), BNP 731 (down from 1200 at recent hospitalization), troponin 3.18, lactic acid 4.49, and ABG with pH 7.37, PCO2 52.8, pO2 185, and bicarb 30.    Lines / Drains: PIVs   Cultures: Blood 1/24 >> Urine 1/24 >> Respiratory 1/24 >> RVP 1/24 >> Influenza PCR 1/24 >>POS  Antibiotics: Levofloxacin 1/24 >> one dose in ED  Vanc 1/24 >>  Aztreonam (PCN allergy) 1/24 >> tamiflu 1/24>>>   Tests / Events: Admit 1/24 >> flu pos, BIPAP  The patient is sedated, intubated and unable to provide history, which was obtained for available medical records.    Past Medical History:  Diagnosis Date  . CAD (coronary artery disease) 05/04/2011  . Diabetes  mellitus without complication (HCC)   . Hyperlipidemia   . Hypertension   . Lumbar disc disease   . Morbid Obesity 05/02/2011  . Non-ST elevation myocardial infarction (NSTEMI), initial episode of care [facility]) 05/02/2011   Past Surgical History:  Procedure Laterality Date  . LEFT HEART CATHETERIZATION WITH CORONARY ANGIOGRAM N/A 05/03/2011   Procedure: LEFT HEART CATHETERIZATION WITH CORONARY ANGIOGRAM;  Surgeon: Othella Boyer, MD;  Location: [facility] CATH LAB;  Service: Cardiovascular;  Laterality: N/A;  . none     Prior to Admission medications   Medication Sig Start Date End Date Taking? Authorizing Provider  albuterol (PROVENTIL HFA;VENTOLIN HFA) 108 (90 Base) MCG/ACT inhaler Inhale 2 puffs into the lungs every 6 (six) hours as needed for wheezing or shortness of breath. 12/31/15   Burns Spain, MD  ASPIR-LOW 81 MG EC tablet TAKE 1 TABLET EVERY DAY 06/15/16   Burns Spain, MD  atorvastatin (LIPITOR) 40 MG tablet Take 1 tablet (40 mg total) by mouth daily. 02/11/15   Burns Spain, MD  furosemide (LASIX) 40 MG tablet Take 1 tablet (40 mg total) by mouth 2 (two) times daily. 12/31/15   Burns Spain, MD  hydrocerin (EUCERIN) CREA Apply to bilateral LEs after cleansing on Mondays and Thursdays (prior to [employer] application). 06/30/16   Jennifer Chahn-Yang Choi, DO  hydrochlorothiazide (HYDRODIURIL) 25 MG tablet Take 1 tablet (25 mg total) by mouth daily. 02/11/15   Burns Spain, MD  metFORMIN (GLUCOPHAGE) 500 MG tablet TAKE 1 TABLET (  500 MG TOTAL) BY MOUTH 2 (TWO) TIMES DAILY WITH A MEAL. 06/15/16   Burns SpainElizabeth A Butcher, MD  verapamil (CALAN-SR) 240 MG CR tablet Take 1 tablet (240 mg total) by mouth 2 (two) times daily. 06/30/16   Jordan HawksJennifer Chahn-Yang Choi, DO   Allergies Allergies  Allergen Reactions  . Lisinopril Swelling    Tongue swelling   . Ace Inhibitors   . Penicillins Itching and Rash    Has patient had a PCN reaction causing immediate rash,  facial/tongue/throat swelling, SOB or lightheadedness with hypotension: unknown Has patient had a PCN reaction causing severe rash involving mucus membranes or skin necrosis: unknown Has patient had a PCN reaction that required hospitalization: no Has patient had a PCN reaction occurring within the last 10 years: no If all of the above answers are "NO", then may proceed with Cephalosporin use.    Family History Family History  Problem Relation Age of Onset  . Cancer Father   . Stroke Mother     Social History  reports that she quit smoking about 5 years ago. Her smoking use included Cigarettes. She has a 25.00 pack-year smoking history. She has never used smokeless tobacco. She reports that she does not drink alcohol or use drugs.  Review Of Systems  11 points review of systems is negative with an exception of listed in HPI.  Vital Signs: Temp:  [102.5 F (39.2 C)] 102.5 F (39.2 C) (01/24 0223) Pulse Rate:  [49-115] 49 (01/24 0130) Resp:  [25-28] 25 (01/24 0130) BP: (121-157)/(61-69) 121/63 (01/24 0130) SpO2:  [100 %] 100 % (01/24 0136) FiO2 (%):  [80 %] 80 % (01/24 0136) Weight:  [146.5 kg (323 lb)] 146.5 kg (323 lb) (01/24 0105) No intake/output data recorded.  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 Small bilateral pleural effusions, ? Superimposed PNA   Assessment and Plan: Patient  is a 76 yo F with pmhx of CAD, dCHF, and morbid obesity who presents today in acute hypoxic respiratory distress concerning for viral PNA, improving on BiPAP.   PULMONARY  ASSESSMENT: Acute hypoxic respiratory distress - secondary to Influenza/ viral process vs. Bacterial PNA or CHF exacerbation, although later two seem less likely  PLAN:   Empiric abx below  Empiric tamiflu  BiPAP  Follow ABG Solumedrol 40 BID  Duonebs q6 Albuterol q2 prn  Follow cultures, RVP, influenza panel   CARDIOVASCULAR  ASSESSMENT:  Troponinemia - ? NSTEMI with new T wave inversions in lateral leads  Hx CAD Hemodynamically stable  PLAN:  Cards consulted per EDP  Hold home HCTZ, verapamil  Hold home lasix pending repeat lactic acid, patient may need fluids  Trend troponins  Repeat AM EKG  Heparin gtt   RENAL  ASSESSMENT:   Acute on Chronic renal dysfunction  PLAN:   Holding home lasix  Avoid nephrotoxic meds Follow BMP  I/Os  GASTROINTESTINAL  ASSESSMENT:   No acute issues PLAN:   NPO   HEMATOLOGIC  ASSESSMENT:   Leukocytosis (lymphocyte predominant)  Normocytic anemia (at baseline)  PLAN:  Follow CBC Heparin gtt   INFECTIOUS  ASSESSMENT:   Lactic acidosis  Procalcitonin < 0.10  ? Viral PNA  ? HCAP  PLAN:   Empiric abx  Vanc 1/24 >> Aztreonam (PCN allergy) 1/24 >>  Tamiflu (renally dosed) 1/24 >>  Follow lactic acid - if continues to rise will give fluid bolus  Follow cultures  Follow flu PCR Follow RVP   ENDOCRINE  ASSESSMENT:   Hyperglycemia    PLAN:   SSI Follow CBGs  NEUROLOGIC  ASSESSMENT:   No acute issues  PLAN:   RASS goal 0  No sedation   Family: No family present at bedside. Patient provided history and expressed desire to remain full code.   Reymundo Poll, M.D. Pager: [phone] 07/06/2016, 4:03 AM   STAFF NOTE: I, Rory Percy, MD FACP have personally reviewed patient's available data, including medical history, events of note,  physical examination and test results as part of my evaluation. I have discussed with resident/NP and other care providers such as pharmacist, RN and RRT. In addition, I personally evaluated patient and elicited key findings of: awakens fc well, NO distres son NIMV, lungs coarse and some crackles, pcxr with difuse int changes, now flu positive with likley a component of d chf, lactic also likely from both viral and dchf, pct neg, her mental status supports NIMV for now and abg is re assuring as well as volume returns and rr, ALI likely, keep steroids, continued tamiflu, schedule NIMV 4 hours on 30 min off, x 24 hours and assess needs in am and pcxr, no further abg required as current MV adaquate on own, pct neg, no secretions nor defined infiltrate, dc vanc, may dc aztreonam in am pending clinical tatsu, lasix x 1 then folow crt and response, NO lactic repeat, NPO as may require intubation The patient is critically ill with multiple organ systems failure and requires high complexity decision making for assessment and support, frequent evaluation and titration of therapies, application of advanced monitoring technologies and extensive interpretation of multiple databases.   Critical Care Time devoted to patient care services described in this note is 35 Minutes. This time reflects time of care of this signee: Rory Percy, MD FACP. This critical care time does not reflect procedure time, or teaching time or supervisory time of PA/NP/Med student/Med Resident etc but could involve care discussion time. Rest per NP/medical resident whose note is outlined above and that I agree with   Mcarthur Rossetti. Tyson Alias, MD, FACP Pgr: [phone] [facility] 07/06/2016 10:07 AM

## 2016-07-06 NOTE — Progress Notes (Signed)
ABG collected  

## 2016-07-06 NOTE — Progress Notes (Addendum)
ANTICOAGULATION/ANTIBIOTIC CONSULT NOTE - Initial Consult  Pharmacy Consult for Heparin, Levaquin, Vancomycin, and Aztreonam Indication: NSTEMI and sepsis  Allergies  Allergen Reactions  . Lisinopril Swelling    Tongue swelling   . Ace Inhibitors Other (See Comments)    unknown  . Penicillins Itching and Rash    Has patient had a PCN reaction causing immediate rash, facial/tongue/throat swelling, SOB or lightheadedness with hypotension: unknown Has patient had a PCN reaction causing severe rash involving mucus membranes or skin necrosis: unknown Has patient had a PCN reaction that required hospitalization: no Has patient had a PCN reaction occurring within the last 10 years: no If all of the above answers are "NO", then may proceed with Cephalosporin use.    Patient Measurements: Height: 5\' 5"  (165.1 cm) Weight: (!) 323 lb (146.5 kg) IBW/kg (Calculated) : 57 Heparin Dosing Weight: 94 kg  Vital Signs: Temp: 102.5 F (39.2 C) (01/24 0223) Temp Source: Rectal (01/24 0223) BP: 121/63 (01/24 0130) Pulse Rate: 49 (01/24 0130)  Labs:  Recent Labs  07/06/16 0106  HGB 9.6*  HCT 29.8*  PLT 367  CREATININE 1.95*  TROPONINI 3.18*    Estimated Creatinine Clearance: 37.6 mL/min (by C-G formula based on SCr of 1.95 mg/dL (H)).   Medical History: Past Medical History:  Diagnosis Date  . CAD (coronary artery disease) 05/04/2011  . Diabetes mellitus without complication (HCC)   . Hyperlipidemia   . Hypertension   . Lumbar disc disease   . Morbid Obesity 05/02/2011  . Non-ST elevation myocardial infarction (NSTEMI), initial episode of care [facility](HCC) 05/02/2011    Medications:  See electronic med rec  Assessment: 76 y.o. F presents with SOB.  AC: To begin heparin for NSTEMI. Trop up to 6.22.  ID: To begin broad spectrum abx for sepsis. Fever up to 102.5 in ED.   Antimicrobials this admission:  1/24 Levaquin x1  1/24 Vanc >>  1/24 Aztreonam >>  Dose adjustments this  admission:  n/a  Microbiology results:  1/24 BCx x2:    Goal of Therapy:  Heparin level 0.3-0.7 units/ml; Vanc trough 15-20 mcg/ml Monitor platelets by anticoagulation protocol: Yes   Plan:  Heparin IV bolus 4000 units Heparin gtt at 1300 units/hr Will f/u heparin level in 8 hours Daily heparin level and CBC Vancomycin 1750 mg q48h Aztreonam 1gm IV q8h Change Tamiflu to 30mg  po BID  Christoper Fabianaron Susane Bey, PharmD, BCPS Clinical pharmacist, pager [phone] 07/06/2016,3:55 AM

## 2016-07-06 NOTE — Progress Notes (Signed)
   07/06/16 0106  BiPAP/CPAP/SIPAP  BiPAP/CPAP/SIPAP Pt Type Adult  Mask Type Full face mask  Mask Size Medium  Set Rate 10 breaths/min  Respiratory Rate 28 breaths/min  IPAP 20 cmH20  EPAP 6 cmH2O  Oxygen Percent 80 %  Flow Rate 0 lpm  Minute Ventilation 17.3  Leak 16  Peak Inspiratory Pressure (PIP) 21  Tidal Volume (Vt) 645  BiPAP/CPAP/SIPAP BiPAP  Patient Home Equipment No  Auto Titrate No  BiPAP/CPAP /SiPAP Vitals  Pulse Rate 115  Resp 26  SpO2 100 % (BiPAP)  Patient placed on BIPAP due SOB and increase WOB.

## 2016-07-06 NOTE — ED Notes (Signed)
Pt has several wounds to her bottom and lower extremities discovered while taking opt rectal temp and inserting suppositories.

## 2016-07-06 NOTE — Progress Notes (Signed)
Pt is resting comfortably at this time no distress noted.  

## 2016-07-06 NOTE — ED Provider Notes (Addendum)
By signing my name below, I, Freida BusmanDiana Omoyeni, attest that this documentation has been prepared under the direction and in the presence of Kristen N Ward, DO . Electronically Signed: Freida Busmaniana Omoyeni, Scribe. 07/06/2016. 1:22 AM.  TIME SEEN: 12:48 AM  CHIEF COMPLAINT: SOB   LEVEL 5 CAVEAT DUE TO ACUITY OF MEDICAL CONDITION   HPI:   HPI Comments:  Jamie Mason is a 76 y.o. female with a history of hypertension, diabetes, hyperlipidemia, CAD, grade 2 diastolic dysfunction who [facility]. Per EMS, they received a call tonight for respiratory distress. EMS notes pt was diagnosed with bronchitis yesterday and upon arrival to the pt her oxygen saturation was in the 80s. She was initially placed on a NRB and then placed on CPAP which improved SaO2 to 92-93%. She was given 10 mg albuterol, 1mg  atrovent, 2g magnesium and 125mg  IV solumedrol en route. Pt denies daily use of home O2 and denies pain at this time. States she is a full code.   It appears per our records patient was just admitted to the hospital service on 06/26/16 for lower extremity wounds and CHF exacerbation.   PCP - Rogelia BogaButcher  Pt is a Full code    Echo 06/27/16:  Study Conclusions  - Left ventricle: LVEF is normal at 60 to 65% with distal inferior   hypokinesis. The cavity size was normal. Wall thickness was   increased in a pattern of moderate LVH. Systolic function was   normal. The estimated ejection fraction was in the range of 60%   to 65%. Features are consistent with a pseudonormal left   ventricular filling pattern, with concomitant abnormal relaxation   and increased filling pressure (grade 2 diastolic dysfunction).   Doppler parameters are consistent with high ventricular filling   pressure. - Mitral valve: There was mild regurgitation. - Pulmonary arteries: PA peak pressure: 47 mm Hg (S).   ROS: See HPI LEVEL 5 CAVEAT DUE TO ACUITY OF MEDICAL  CONDITION   PAST MEDICAL HISTORY/PAST SURGICAL HISTORY:  Past Medical History:  Diagnosis Date  . CAD (coronary artery disease) 05/04/2011  . Diabetes mellitus without complication (HCC)   . Hyperlipidemia   . Hypertension   . Lumbar disc disease   . Morbid Obesity 05/02/2011  . Non-ST elevation myocardial infarction (NSTEMI), initial episode of care [facility](HCC) 05/02/2011    MEDICATIONS:  Prior to Admission medications   Medication Sig Start Date End Date Taking? Authorizing Provider  albuterol (PROVENTIL HFA;VENTOLIN HFA) 108 (90 Base) MCG/ACT inhaler Inhale 2 puffs into the lungs every 6 (six) hours as needed for wheezing or shortness of breath. 12/31/15   Burns SpainElizabeth A Butcher, MD  ASPIR-LOW 81 MG EC tablet TAKE 1 TABLET EVERY DAY 06/15/16   Burns SpainElizabeth A Butcher, MD  atorvastatin (LIPITOR) 40 MG tablet Take 1 tablet (40 mg total) by mouth daily. 02/11/15   Burns SpainElizabeth A Butcher, MD  furosemide (LASIX) 40 MG tablet Take 1 tablet (40 mg total) by mouth 2 (two) times daily. 12/31/15   Burns SpainElizabeth A Butcher, MD  hydrocerin (EUCERIN) CREA Apply to bilateral LEs after cleansing on Mondays and Thursdays (prior to [employer] application). 06/30/16   Jennifer Chahn-Yang Choi, DO  hydrochlorothiazide (HYDRODIURIL) 25 MG tablet Take 1 tablet (25 mg total) by mouth daily. 02/11/15   Burns SpainElizabeth A Butcher, MD  metFORMIN (GLUCOPHAGE) 500 MG tablet TAKE 1 TABLET (500 MG TOTAL) BY MOUTH 2 (TWO) TIMES DAILY WITH A MEAL. 06/15/16  Burns Spain, MD  verapamil (CALAN-SR) 240 MG CR tablet Take 1 tablet (240 mg total) by mouth 2 (two) times daily. 06/30/16   Jordan Hawks, DO    ALLERGIES:  Allergies  Allergen Reactions  . Lisinopril Swelling    Tongue swelling   . Ace Inhibitors   . Penicillins Itching and Rash    Has patient had a PCN reaction causing immediate rash, facial/tongue/throat swelling, SOB or lightheadedness with hypotension: unknown Has patient had a PCN reaction causing severe rash  involving mucus membranes or skin necrosis: unknown Has patient had a PCN reaction that required hospitalization: no Has patient had a PCN reaction occurring within the last 10 years: no If all of the above answers are "NO", then may proceed with Cephalosporin use.    SOCIAL HISTORY:  Social History  Substance Use Topics  . Smoking status: Former Smoker    Packs/day: 0.50    Years: 50.00    Types: Cigarettes    Quit date: 04/01/2011  . Smokeless tobacco: Never Used  . Alcohol use No    FAMILY HISTORY: Family History  Problem Relation Age of Onset  . Cancer Father   . Stroke Mother     EXAM: BP 121/63   Pulse (!) 49   Resp 25   Ht 5\' 5"  (1.651 m)   Wt (!) 323 lb (146.5 kg)   SpO2 100%   BMI 53.75 kg/m Rectal temp 102.5 CONSTITUTIONAL: Alert and oriented. Elderly chronically ill appearing. Obese and in severe respiratory distress HEAD: Normocephalic EYES: Conjunctivae clear, PERRL, EOMI ENT: normal nose; no rhinorrhea; moist mucous membranes NECK: Supple, no meningismus, no nuchal rigidity, no LAD  CARD: Regular and tachycardic; S1 and S2 appreciated; no murmurs, no clicks, no rubs, no gallops RESP: Hypoxic on RA; in severe respiratory distress. Unable to speak. Tachypneic with increased work of breathing. Diffuse rhonchorous breath sounds Worse at her bases bilaterally ABD/GI: Normal bowel sounds; non-distended; soft, non-tender, no rebound, no guarding, no peritoneal signs, no hepatosplenomegaly BACK:  The back appears normal and is non-tender to palpation, there is no CVA tenderness EXT: Normal ROM in all joints; non-tender to palpation; non pitting edema from knee down bilaterally with superficial skin breakdown on left anterior leg without signs of superimposed infection; normal capillary refill; no cyanosis SKIN: Normal color for age and race; warm; no rash NEURO: Moves all extremities equally   MEDICAL DECISION MAKING: Patient here in severe respiratory distress.  Differential diagnosis includes pneumonia, influenza, pulmonary edema. No history of asthma, COPD.  Portable chest x-ray appears to show volume overload versus pneumonia. Will obtain rectal temperature. Will obtain ABG, labs, flu swab. We'll continue albuterol. Patient denies any chest pain.  ED PROGRESS: Patient has a rectal temperature of 102.5. She is tachycardic, tachypneic but normotensive. Now speaking full sentences with BiPAP on setting 100%. Lactate is 4.49. We'll give IV fluids but do so cautiously given recent volume overload and diastolic heart failure. I suspect that lactate is accommodation of sepsis but also respiratory failure. I have discussed this with patient and that we will hydrate her gently and she is in agreement. Given patient's troponin of greater than 6, I did consult cardiology. Spoke with Dr. Donnie Aho. He states that the patient does not need emergent cardiac catheterization or emergent cardiology consult and they can be consulted non-emergently in the morning once her sepsis has been treated and she has been appropriately resuscitated. We agree that this is likely from demand ischemia, sepsis and  possible CHF exacerbation. He recommends medicine admission. Flu swab pending. Given elevated troponin, elevated lactate, respiratory distress on BiPAP, will discuss with critical care.  2:30 AM  D/w critical care Dr. Vassie Loll.  He is reviewed patient's imaging and feels this looks more like pulmonary edema and recommends that we stop IV fluids. Critical care fellow will see the patient in the emergency department to decide if I fluid should be continued or patient should be diuresed. Her ABG is reassuring. She is still speaking full sentences and reports feeling much better. Still denies any pain. She tells me that symptoms started tonight. She was not aware that she had fevers, chills. Denies any cough. Denies wearing oxygen at home.   2:50 AM  CCM has seen and they will admit.   I  reviewed all nursing notes, vitals, pertinent old records, EKGs, labs, imaging (as available).    EKG Interpretation  Date/Time:  Wednesday July 06 2016 00:53:55 EST Ventricular Rate:  116 PR Interval:    QRS Duration: 101 QT Interval:  350 QTC Calculation: 487 R Axis:   65 Text Interpretation:  Sinus tachycardia Ventricular premature complex Repol abnrm suggests ischemia, diffuse leads No significant change since last tracing Confirmed by WARD,  DO, KRISTEN [phone]) on 07/06/2016 1:02:53 AM        EKG Interpretation  Date/Time:  Wednesday July 06 2016 01:24:26 EST Ventricular Rate:  109 PR Interval:    QRS Duration: 98 QT Interval:  397 QTC Calculation: 535 R Axis:   65 Text Interpretation:  Sinus tachycardia Multiple ventricular premature complexes Repol abnrm suggests ischemia, anterolateral Prolonged QT interval No significant change since last tracing in 2012 Confirmed by WARD,  DO, KRISTEN [phone]) on 07/06/2016 1:38:34 AM        CRITICAL CARE Performed by: Raelyn Number   Total critical care time: 65 minutes  Critical care time was exclusive of separately billable procedures and treating other patients.  Critical care was necessary to treat or prevent imminent or life-threatening deterioration.  Critical care was time spent personally by me on the following activities: development of treatment plan with patient and/or surrogate as well as nursing, discussions with consultants, evaluation of patient's response to treatment, examination of patient, obtaining history from patient or surrogate, ordering and performing treatments and interventions, ordering and review of laboratory studies, ordering and review of radiographic studies, pulse oximetry and re-evaluation of patient's condition.        Jamie Maw Ward, DO 07/06/16 0981    Jamie Maw Ward, DO 07/06/16 1914    Jamie Maw Ward, DO 07/06/16 [id number]

## 2016-07-06 NOTE — Progress Notes (Signed)
ANTICOAGULATION CONSULT NOTE - Follow Up Consult  Pharmacy Consult for heparin Indication: NSTEMI  Patient Measurements: Height: 5\' 5"  (165.1 cm) Weight: (!) 301 lb 13 oz (136.9 kg) IBW/kg (Calculated) : 57 Heparin Dosing Weight: 90.9 kg  Vital Signs: Temp: 98.9 F (37.2 C) (01/24 1100) Temp Source: Oral (01/24 1100) BP: 124/57 (01/24 0915) Pulse Rate: 87 (01/24 0915)  Labs:  Recent Labs  07/06/16 0106 07/06/16 0356 07/06/16 1427  HGB 9.6* 9.3*  --   HCT 29.8* 28.5*  --   PLT 367 370  --   HEPARINUNFRC  --   --  0.77*  CREATININE 1.95* 2.12*  --   TROPONINI 3.18* 2.23*  --     Estimated Creatinine Clearance: 33.2 mL/min (by C-G formula based on SCr of 2.12 mg/dL (H)).   Assessment: [name] admitted 1/24 for sepsis and NSTEMI, to continue on heparin gtt. Initial heparin level supratherapeutic at 0.77 on 1300 units/hr. Hgb low stable, Plt wnl, no bleeding noted.  Goal of Therapy:  Heparin level 0.3-0.7 units/ml Monitor platelets by anticoagulation protocol: Yes   Plan:  Decrease heparin gtt to 1200 units/hr Check heparin level in 8 hours Daily heparin level, CBC Monitor s/sx of bleeding   Mackie Paienee Rubee Vega, PharmD PGY1 Pharmacy Resident Pager: [phone] 07/06/2016 3:17 PM

## 2016-07-07 ENCOUNTER — Inpatient Hospital Stay (HOSPITAL_COMMUNITY): Payer: Medicare HMO

## 2016-07-07 LAB — CBC WITH DIFFERENTIAL/PLATELET
BASOS ABS: 0 10*3/uL (ref 0.0–0.1)
BASOS PCT: 0 %
EOS ABS: 0 10*3/uL (ref 0.0–0.7)
Eosinophils Relative: 0 %
HEMATOCRIT: 28.9 % — AB (ref 36.0–46.0)
HEMOGLOBIN: 9.3 g/dL — AB (ref 12.0–15.0)
Lymphocytes Relative: 9 %
Lymphs Abs: 2.3 10*3/uL (ref 0.7–4.0)
MCH: 28.3 pg (ref 26.0–34.0)
MCHC: 32.2 g/dL (ref 30.0–36.0)
MCV: 87.8 fL (ref 78.0–100.0)
Monocytes Absolute: 1.1 10*3/uL — ABNORMAL HIGH (ref 0.1–1.0)
Monocytes Relative: 4 %
NEUTROS ABS: 20.9 10*3/uL — AB (ref 1.7–7.7)
NEUTROS PCT: 87 %
Platelets: 399 10*3/uL (ref 150–400)
RBC: 3.29 MIL/uL — AB (ref 3.87–5.11)
RDW: 15.8 % — ABNORMAL HIGH (ref 11.5–15.5)
WBC: 24.2 10*3/uL — AB (ref 4.0–10.5)

## 2016-07-07 LAB — COMPREHENSIVE METABOLIC PANEL
ALK PHOS: 52 U/L (ref 38–126)
ALT: 45 U/L (ref 14–54)
ANION GAP: 11 (ref 5–15)
AST: 99 U/L — ABNORMAL HIGH (ref 15–41)
Albumin: 2.5 g/dL — ABNORMAL LOW (ref 3.5–5.0)
BILIRUBIN TOTAL: 0.6 mg/dL (ref 0.3–1.2)
BUN: 29 mg/dL — ABNORMAL HIGH (ref 6–20)
CALCIUM: 8.4 mg/dL — AB (ref 8.9–10.3)
CO2: 28 mmol/L (ref 22–32)
CREATININE: 1.57 mg/dL — AB (ref 0.44–1.00)
Chloride: 101 mmol/L (ref 101–111)
GFR, EST AFRICAN AMERICAN: 37 mL/min — AB (ref >60)
GFR, EST NON AFRICAN AMERICAN: 32 mL/min — AB (ref >60)
Glucose, Bld: 195 mg/dL — ABNORMAL HIGH (ref 65–99)
Potassium: 3.9 mmol/L (ref 3.5–5.1)
SODIUM: 140 mmol/L (ref 135–145)
TOTAL PROTEIN: 7.5 g/dL (ref 6.5–8.1)

## 2016-07-07 LAB — STREP PNEUMONIAE URINARY ANTIGEN: Strep Pneumo Urinary Antigen: NEGATIVE

## 2016-07-07 LAB — RESPIRATORY PANEL BY PCR
ADENOVIRUS-RVPPCR: NOT DETECTED
Bordetella pertussis: NOT DETECTED
CHLAMYDOPHILA PNEUMONIAE-RVPPCR: NOT DETECTED
CORONAVIRUS 229E-RVPPCR: NOT DETECTED
CORONAVIRUS NL63-RVPPCR: NOT DETECTED
CORONAVIRUS OC43-RVPPCR: NOT DETECTED
Coronavirus HKU1: NOT DETECTED
INFLUENZA A H3-RVPPCR: DETECTED — AB
INFLUENZA B-RVPPCR: NOT DETECTED
MYCOPLASMA PNEUMONIAE-RVPPCR: NOT DETECTED
Metapneumovirus: NOT DETECTED
PARAINFLUENZA VIRUS 1-RVPPCR: NOT DETECTED
Parainfluenza Virus 2: NOT DETECTED
Parainfluenza Virus 3: NOT DETECTED
Parainfluenza Virus 4: NOT DETECTED
RESPIRATORY SYNCYTIAL VIRUS-RVPPCR: NOT DETECTED
Rhinovirus / Enterovirus: NOT DETECTED

## 2016-07-07 LAB — URINALYSIS, ROUTINE W REFLEX MICROSCOPIC
BILIRUBIN URINE: NEGATIVE
Glucose, UA: NEGATIVE mg/dL
Ketones, ur: NEGATIVE mg/dL
Nitrite: NEGATIVE
Protein, ur: 30 mg/dL — AB
SPECIFIC GRAVITY, URINE: 1.017 (ref 1.005–1.030)
pH: 5 (ref 5.0–8.0)

## 2016-07-07 LAB — HEPARIN LEVEL (UNFRACTIONATED)
HEPARIN UNFRACTIONATED: 0.66 [IU]/mL (ref 0.30–0.70)
HEPARIN UNFRACTIONATED: 0.6 [IU]/mL (ref 0.30–0.70)
Heparin Unfractionated: 0.76 IU/mL — ABNORMAL HIGH (ref 0.30–0.70)

## 2016-07-07 LAB — GLUCOSE, CAPILLARY
GLUCOSE-CAPILLARY: 155 mg/dL — AB (ref 65–99)
GLUCOSE-CAPILLARY: 188 mg/dL — AB (ref 65–99)
GLUCOSE-CAPILLARY: 191 mg/dL — AB (ref 65–99)
GLUCOSE-CAPILLARY: 231 mg/dL — AB (ref 65–99)
Glucose-Capillary: 137 mg/dL — ABNORMAL HIGH (ref 65–99)

## 2016-07-07 MED ORDER — IPRATROPIUM-ALBUTEROL 0.5-2.5 (3) MG/3ML IN SOLN
3.0000 mL | Freq: Three times a day (TID) | RESPIRATORY_TRACT | Status: DC
  Administered 2016-07-07 – 2016-07-08 (×4): 3 mL via RESPIRATORY_TRACT
  Filled 2016-07-07 (×8): qty 3

## 2016-07-07 MED ORDER — INSULIN ASPART 100 UNIT/ML ~~LOC~~ SOLN: 0.0000 [IU] | Freq: Three times a day (TID) | SUBCUTANEOUS | Status: DC

## 2016-07-07 MED ORDER — HYDROCHLOROTHIAZIDE 25 MG PO TABS
25.0000 mg | ORAL_TABLET | Freq: Every day | ORAL | Status: DC
  Administered 2016-07-07 – 2016-07-08 (×2): 25 mg via ORAL
  Filled 2016-07-07 (×2): qty 1

## 2016-07-07 MED ORDER — SENNOSIDES-DOCUSATE SODIUM 8.6-50 MG PO TABS
1.0000 | ORAL_TABLET | Freq: Two times a day (BID) | ORAL | Status: DC
  Administered 2016-07-07 – 2016-07-16 (×13): 1 via ORAL
  Filled 2016-07-07 (×16): qty 1

## 2016-07-07 MED ORDER — INSULIN ASPART 100 UNIT/ML ~~LOC~~ SOLN
0.0000 [IU] | Freq: Three times a day (TID) | SUBCUTANEOUS | Status: DC
  Administered 2016-07-08 (×3): 5 [IU] via SUBCUTANEOUS
  Administered 2016-07-09: 8 [IU] via SUBCUTANEOUS
  Administered 2016-07-09: 5 [IU] via SUBCUTANEOUS
  Administered 2016-07-09 – 2016-07-10 (×4): 8 [IU] via SUBCUTANEOUS
  Administered 2016-07-10: 15 [IU] via SUBCUTANEOUS
  Administered 2016-07-10: 11 [IU] via SUBCUTANEOUS
  Administered 2016-07-11: 5 [IU] via SUBCUTANEOUS
  Administered 2016-07-11: 8 [IU] via SUBCUTANEOUS

## 2016-07-07 NOTE — Consult Note (Signed)
CARDIOLOGY CONSULT NOTE   Patient ID: Jamie Mason MRN: [id number] DOB/AGE: 74/23/1942 74 y.o.  Admit date: 07/06/2016  Primary Physician   Blanch Media, MD Primary Cardiologist   New (previous pt of Dr. Donnie Aho - last seen in 2012) Reason for Consultation   NSTEMI Requesting Physician  Dr. Vassie Loll  HPI: Jamie Mason is a 76 y.o. female with a history of CAD, Hypertensive heart disease, hyperlipidemia, diabetes, lumbar disc problem, morbid obesity, former smoker and recent admission to [facility] long who presents with shortness of breath.  Previous  patient of Dr. Donnie Aho. Last seen during admission for non-STEMI in 04/2011. She was found to have mild inferior ST elevation with inferolateral T wave inversions and her troponins were positive. She underwent cardiac catheterization the next day with findings of an occluded right coronary artery with collaterals from the left coronary system. The left anterior descending had moderate disease in the midportion. Circumflex had mild irregularities, there was mild distal left main disease. Left ventricular function was normal she had significant left ventricular hypertrophy on echocardiogram. Per patient she has never followed of the Dr. Donnie Aho after words. Followed by PCP.   Recent admission to [facility] 1/14-1/18 for cellulitis of bilateral lower extremities and adequate diastolic heart failure. Dr. Catha Gosselin spoke with ID, recommended discontinuing antibiotics and watching, as her wounds were more likely due to chronic venous stasis/insufficiency, without acute infection. Echo showed EF of EF 60-65% and Grade 2 diastolic dysfunction. BNP of 1283. Diuresed 3lb (326-->323lb). She is discharged to nursing home facility.   She proved to emergency department by EMS night of Tuesday with severe shortness of breath. EMS found her on oxygen saturation at 80's. Place on nonrebreather and CPAP with improvement. Also treated with nebulizer and steroids. On arrival to  ED her temperature was elevated to 102. Elevated lactic acid. She was started on IV heparin due to elevated troponin, now trending down. SCr of 2.4-->1.57. BNP of 731.2. Net I & O +1.4.   He is a previous smoker.  she works as a Financial trader. Retired. Mostly sedentary due to chronic knee and lumbar issue. Does complain of occasional substernal chest discomfort that radiates to her throat intermittently. Occurs with and without exertion. Unable to provide further information.  Past Medical History:  Diagnosis Date  . CAD (coronary artery disease) 05/04/2011  . Diabetes mellitus without complication (HCC)   . Hyperlipidemia   . Hypertension   . Lumbar disc disease   . Morbid Obesity 05/02/2011  . Non-ST elevation myocardial infarction (NSTEMI), initial episode of care [facility]) 05/02/2011     Past Surgical History:  Procedure Laterality Date  . LEFT HEART CATHETERIZATION WITH CORONARY ANGIOGRAM N/A 05/03/2011   Procedure: LEFT HEART CATHETERIZATION WITH CORONARY ANGIOGRAM;  Surgeon: Othella Boyer, MD;  Location: [facility] CATH LAB;  Service: Cardiovascular;  Laterality: N/A;  . none      Allergies  Allergen Reactions  . Lisinopril Swelling    Tongue swelling   . Ace Inhibitors Other (See Comments)    unknown  . Penicillins Itching and Rash    Has patient had a PCN reaction causing immediate rash, facial/tongue/throat swelling, SOB or lightheadedness with hypotension: unknown Has patient had a PCN reaction causing severe rash involving mucus membranes or skin necrosis: unknown Has patient had a PCN reaction that required hospitalization: no Has patient had a PCN reaction occurring within the last 10 years: no If all of the above answers are "NO", then may proceed with Cephalosporin use.  I have reviewed the patient's current medications . aspirin EC  81 mg Oral Daily  . atorvastatin  40 mg Oral q1800  . chlorhexidine  15 mL Mouth Rinse BID  . hydrochlorothiazide  25 mg Oral Daily  .  insulin aspart  0-15 Units Subcutaneous Q4H  . ipratropium-albuterol  3 mL Nebulization Q6H  . mouth rinse  15 mL Mouth Rinse q12n4p  . methylPREDNISolone (SOLU-MEDROL) injection  40 mg Intravenous Q12H  . oseltamivir  30 mg Oral BID  . senna-docusate  1 tablet Oral BID   . heparin 1,050 Units/hr (07/07/16 0032)   sodium chloride, acetaminophen, albuterol, ondansetron (ZOFRAN) IV  Prior to Admission medications   Medication Sig Start Date End Date Taking? Authorizing Provider  albuterol (PROVENTIL HFA;VENTOLIN HFA) 108 (90 Base) MCG/ACT inhaler Inhale 2 puffs into the lungs every 6 (six) hours as needed for wheezing or shortness of breath. 12/31/15  Yes Burns Spain, MD  ASPIR-LOW 81 MG EC tablet TAKE 1 TABLET EVERY DAY 06/15/16  Yes Burns Spain, MD  atorvastatin (LIPITOR) 40 MG tablet Take 1 tablet (40 mg total) by mouth daily. 02/11/15  Yes Burns Spain, MD  furosemide (LASIX) 40 MG tablet Take 1 tablet (40 mg total) by mouth 2 (two) times daily. 12/31/15  Yes Burns Spain, MD  hydrocerin (EUCERIN) CREA Apply to bilateral LEs after cleansing on Mondays and Thursdays (prior to [employer] application). 06/30/16  Yes Jennifer Chahn-Yang Choi, DO  hydrochlorothiazide (HYDRODIURIL) 25 MG tablet Take 1 tablet (25 mg total) by mouth daily. 02/11/15  Yes Burns Spain, MD  metFORMIN (GLUCOPHAGE) 500 MG tablet TAKE 1 TABLET (500 MG TOTAL) BY MOUTH 2 (TWO) TIMES DAILY WITH A MEAL. 06/15/16  Yes Burns Spain, MD  verapamil (CALAN-SR) 240 MG CR tablet Take 1 tablet (240 mg total) by mouth 2 (two) times daily. 06/30/16  Yes Jordan Hawks, DO     Social History   Social History  . Marital status: Married    Spouse name: N/A  . Number of children: N/A  . Years of education: 20   Occupational History  .  Unemployed   Social History Main Topics  . Smoking status: Former Smoker    Packs/day: 0.50    Years: 50.00    Types: Cigarettes    Quit date:  04/01/2011  . Smokeless tobacco: Never Used  . Alcohol use No  . Drug use: No  . Sexual activity: No   Other Topics Concern  . Not on file   Social History Narrative   June 2016 : married. Lives with husband (blind) and adult daughter. 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 Independent in ADL's. Doesn't go to stores / church 2/2 pain / trouble walking. Cold [place] tobacco about 2011.    Family Status  Relation Status  . Father Deceased at age 53   Cancer  . Mother Deceased at age  108   Stroke  . Brother Deceased   Prostate cancer  . Sister Deceased at age 44   [facility] spotted fever  . Sister Deceased at age 69   Lung cancer  . Sister Alive  . Sister Alive  . Brother Alive   Family History  Problem Relation Age of Onset  . Cancer Father   . Stroke Mother      ROS:  Full 14 point review of systems complete and found to be negative unless listed above.  Physical Exam: Blood pressure 123/62, pulse 84, temperature 97.4 F (36.3 C), temperature source Oral, resp. rate 16, height 5\' 5"  (1.651 m), weight (!) 302 lb 0.5 oz (137 kg), SpO2 98 %.  General: Well developed, well nourished, obese female in no acute distress Head: Eyes PERRLA, No xanthomas. Normocephalic and atraumatic, oropharynx without edema or exudate.  Lungs: Resp regular and unlabored, course breath sound Heart: RRR no s3, s4, or murmurs..   Neck: No carotid bruits. No lymphadenopathy. No  JVD. Abdomen: Bowel sounds present, abdomen soft and non-tender without masses or hernias noted. Msk:  No spine or cva tenderness. No weakness, no joint deformities or effusions. Extremities: No clubbing, cyanosis or edema.  DP/PT/Radials 2+ and equal bilaterally. Chronic venous statis.  Neuro: Alert and oriented X 3. No focal deficits noted. Psych:  Good affect, responds appropriately Skin: No rashes or lesions noted.  Labs:   Lab Results  Component Value Date   WBC 24.2 (H)  07/07/2016   HGB 9.3 (L) 07/07/2016   HCT 28.9 (L) 07/07/2016   MCV 87.8 07/07/2016   PLT 399 07/07/2016   No results for input(s): INR in the last 72 hours.  Recent Labs Lab 07/07/16 0921  NA 140  K 3.9  CL 101  CO2 28  BUN 29*  CREATININE 1.57*  CALCIUM 8.4*  PROT 7.5  BILITOT 0.6  ALKPHOS 52  ALT 45  AST 99*  GLUCOSE 195*  ALBUMIN 2.5*   Magnesium  Date Value Ref Range Status  06/26/2016 1.8 1.7 - 2.4 mg/dL Final    Recent Labs  16/10/96 0106 07/06/16 0356 07/06/16 1427  TROPONINI 3.18* 2.23* 1.93*    Recent Labs  07/06/16 0111  TROPIPOC 6.22*   Pro B Natriuretic peptide (BNP)  Date/Time Value Ref Range Status  05/02/2011 04:30 PM 1282.0 (H) 0 - 125 pg/mL Final   Lab Results  Component Value Date   CHOL 170 03/19/2014   HDL 26 (L) 03/19/2014   LDLCALC 128 (H) 03/19/2014   TRIG 79 03/19/2014   No results found for: DDIMER Lipase  Date/Time Value Ref Range Status  05/02/2011 03:46 PM 11 11 - 59 U/L Final   TSH  Date/Time Value Ref Range Status  03/19/2014 04:36 PM 1.615 0.350 - 4.500 uIU/mL Final   Vitamin B-12  Date/Time Value Ref Range Status  06/28/2016 06:21 AM 420 180 - 914 pg/mL Final    Comment:    (NOTE) This assay is not validated for testing neonatal or myeloproliferative syndrome specimens for Vitamin B12 levels. Performed at [facility]    Folate  Date/Time Value Ref Range Status  06/28/2016 06:21 AM 7.7 >5.9 ng/mL Final    Comment:    Performed at [facility]   Ferritin  Date/Time Value Ref Range Status  06/28/2016 06:21 AM 227 11 - 307 ng/mL Final    Comment:    Performed at [facility]   TIBC  Date/Time Value Ref Range Status  06/28/2016 06:21 AM 197 (L) 250 - 450 ug/dL Final   Iron  Date/Time Value Ref Range Status  06/28/2016 06:21 AM 26 (L) 28 - 170 ug/dL Final   Retic Ct Pct  Date/Time Value Ref Range Status  06/28/2016 06:15 AM 2.9 0.4 - 3.1 % Final    Echo: 06/27/16 LV EF:  60% -   65%  ------------------------------------------------------------------- Indications:      Dyspnea 786.09.  ------------------------------------------------------------------- History:   PMH:  Lower Extremity Edema with bilateral ulcerations. Coronary  artery disease.  PMH:   Myocardial infarction.  Risk factors:  Hypertension. Diabetes mellitus. Dyslipidemia.  ------------------------------------------------------------------- Study Conclusions  - Left ventricle: LVEF is normal at 60 to 65% with distal inferior   hypokinesis. The cavity size was normal. Wall thickness was   increased in a pattern of moderate LVH. Systolic function was   normal. The estimated ejection fraction was in the range of 60%   to 65%. Features are consistent with a pseudonormal left   ventricular filling pattern, with concomitant abnormal relaxation   and increased filling pressure (grade 2 diastolic dysfunction).   Doppler parameters are consistent with high ventricular filling   pressure. - Mitral valve: There was mild regurgitation. - Pulmonary arteries: PA peak pressure: 47 mm Hg (S).  ECG:  Sinus tachycardia at rate of 109bpm. ST/ T abnormality in inferior lateral leads, which appears some what new when compared to prior EKG.    Cath 05/03/11 ANGIOGRAPHIC DATA:     Coronary arteries: Arise and distribute normally. The system is right dominant. No significant coronary calcification is noted.  Left main coronary artery: Very mild irregularity distally.   Left anterior descending:. Moderate disease present in the midportion of the LAD estimated at 40-50%  Circumflex coronary artery: Moderate irregularity but no significant focal obstructive stenoses noted  Right coronary artery: Occluded after a large right ventricular branch or acute marginal artery. There is very faint antegrade flow and the distal vessel fills by left to right collaterals as well as left to left collaterals through  the acute marginal artery.  LEFT VENTRICULOGRAM:  Left ventricular angiogram was done in the 30 RAO projection. The left inch but appears mildly dilated with increased trabeculations. There is very minimal inferobasal hypokinesis noted. The estimated ejection fraction is 60%.  IMPRESSIONS:  1. 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 Moderate disease involving the left coronary system-predominantly the LAD 3. Normal left particular systolic function with left ventricular hypertrophy and increased LVEDP with minimal inferobasal hypokinesis  RECOMMENDATION:  The patient will need to have intensive risk factor modification with good blood pressure control, cigarette smoking cessation, beta blocker and ACE therapy and treatment of lipids.  Radiology:  Dg Chest Port 1 View  Result Date: 07/07/2016 CLINICAL DATA:  Respiratory distress EXAM: PORTABLE CHEST 1 VIEW COMPARISON:  07/06/2016 FINDINGS: Stable cardiopericardial enlargement. Hazy opacity bilaterally, borderline improved. There is cephalized blood flow. Mild atelectasis or scarring the left mid lung. No visible effusion or pneumothorax. IMPRESSION: CHF pattern with borderline improvement since yesterday. Electronically Signed   By: Marnee SpringJonathon  Watts M.D.   On: 07/07/2016 06:55   Dg Chest Portable 1 View  Result Date: 07/06/2016 CLINICAL DATA:  Respiratory distress and dyspnea EXAM: PORTABLE CHEST 1 VIEW COMPARISON:  03/19/2014 FINDINGS: Heart is borderline enlarged. There is aortic atherosclerosis. There is obscuration of portions of both hemidiaphragms which may be from small layering effusions. Diffuse airspace opacities are noted bilaterally consistent with CHF. Superimposed pneumonia would be difficult to entirely exclude. There is chronic linear scarring scarring in the left mid lung. No acute osseous abnormality. IMPRESSION: Borderline cardiomegaly with diffuse  bilateral airspace disease suspicious for CHF. Obscuration of portions of both diaphragms may be secondary to small effusions. Superimposed pneumonia would be difficult to entirely exclude. Aortic atherosclerosis. Electronically Signed   By: Tollie Ethavid  Kwon M.D.   On: 07/06/2016 01:20    ASSESSMENT AND PLAN:     1. NSTEMI - Troponin  trend 3.18-->2.23-->1.93. EKG with new nonspecific changes. She does have atypical chest pain chronically. Last cath in 2012 as above. She will need ischemic evaluation when stable from a respiratory perspective. Cath  Monday with Dr. Okey Dupre. Will need cath order based on labs and respiratory status over weekend. Continue heparin. Chest pain free.   2. Chronic diastolic CHF - BNP is trending down from last admission. CXR with ? CHF. Echo 1/15 showed normal LV function, moderate LVH, grade DD, mild MR and PA peak pressure: 47 mm Hg (S). Given Iv lasix 60mg  x 1 yesterday. Also given fluids. Volume status looks ok. Net I & O +1.433L so far.   3. Chronic venous statis  4. AKI - Scr improving   5. HTN - Stable. Continue HCTZ.   SignedManson Passey, PA 07/07/2016, 11:01 AM Pager [phone]  Co-Sign MD  Patient examined chart reviewed. Obese black female sitting in chair. No murmur distant heart sounds. Diffuse expiratory wheezing with plus 2 LE edema. History of CAD with cath by Dr Donnie Aho 2012 total RCA with collaterals and  Moderate LAD disease. Elevated troponin in 3 range with inferolateral T wave changes. Myovue will not be helpful due to body habitus and known collateralized RCA. Favor cath on Monday when respiratory status better. Continue heparin  Charlton Haws

## 2016-07-07 NOTE — Care Management Note (Signed)
Case Management Note  Patient Details  Name: Jamie Mason MRN: [id number] Date of Birth: 03/22/1941  Subjective/Objective:                 [facility]. Positive Flu this admission. [facility] for cellulitis/ CHF.   Action/Plan:   Expected Discharge Date:                  Expected Discharge Plan:  Skilled Nursing Facility  In-House Referral:  Clinical Social Work  Discharge planning Services  CM Consult  Post Acute Care Choice:    Choice offered to:     DME Arranged:    DME Agency:     HH Arranged:    HH Agency:     Status of Service:  In process, will continue to follow  If discussed at Long Length of Stay Meetings, dates discussed:    Additional Comments:  Lawerance SabalDebbie Alexxia Stankiewicz, RN 07/07/2016, 10:04 AM

## 2016-07-07 NOTE — H&P (Signed)
Name: Jamie Mason MRN: [id number] DOB: [birth year]    LOS: 1  PCCM ADMISSION NOTE  History of Present Illness: Patient is a 76 yo F with pmhx significant for dCHF, morbid obesity, who presents today with SOB. She was recently admitted to [facility] 1/14 for CHF exacerbation and cellulitis from her chronic lower extremities wounds. She improved with diuresis and antibiotics. Today she was brought in via EMS from her nursing home for respiratory distress. Patient said she developed a cough yesterday and was diagnosed with bronchitis. She was treated with antibiotics. Today she began having difficulty breathing and nursing staff called 911. On EMS arrival her oxygen saturation was in the 80s. She was initially placed on a non re breather and then a CPAP with improvement in SaO2 to 92-93%. She was given 10 mg albuterol, 1mg  atrovent, 2g magnesium and 125mg  IV solumedrol en route.   In the ED she was febrile to 102.5 and hemodynamically stable (BP 121/63, HR 107, RR 25, Oxygen 100% on BiPAP).  She was alert and oriented and able to answer all questions. She denied any fevers, weight gain, N/V/D. She did endorse this SOB feeling similar to other CHF exacerbations. Labs were significant for a mild leukocytosis 12.5, elevated creatinine 1.95 (baseline ~1.1), BNP 731 (down from 1200 at recent hospitalization), troponin 3.18, lactic acid 4.49, and ABG with pH 7.37, PCO2 52.8, pO2 185, and bicarb 30.    Lines / Drains: PIVs   Cultures: Blood 1/24 >> Urine 1/24 >> Respiratory 1/24 >> RVP 1/24 >> Influenza PCR 1/24 >>POS  Antibiotics: Levofloxacin 1/24 >> one dose in ED  Vanc 1/24 >> 1/24 Aztreonam (PCN allergy) 1/24 >> tamiflu 1/24>>>   Tests / Events: Admit 1/24 >> flu pos, BIPAP 1/25- no NIMV improved status, improved pcxr  The patient is sedated, intubated and unable to provide history, which was obtained for available medical records.    Vital Signs: Temp:  [97.4 F (36.3 C)-98.9 F  (37.2 C)] 97.4 F (36.3 C) (01/25 0809) Pulse Rate:  [68-89] 68 (01/25 0700) Resp:  [12-19] 17 (01/25 0700) BP: (77-150)/(48-112) 150/76 (01/25 0700) SpO2:  [98 %-100 %] 100 % (01/25 0836) FiO2 (%):  [50 %] 50 % (01/24 1308) Weight:  [137 kg (302 lb 0.5 oz)] 137 kg (302 lb 0.5 oz) (01/25 0300) I/O last 3 completed shifts: In: 1312.3 [I.V.:362.3; IV Piggyback:950] Out: -   Physical Examination: General:  Obese, normal rr Neuro:  A & O x 3  HEENT: jvd, no nimv Cardiovascular:  Distant heart sounds, RRR  Lungs:  Less  Crackles / coarse Abdomen:  Distended, but soft, non tender, normal bowel sounds  Extremities: slight increase edema Skin:  Chronic lower extremity venous stasis dermatitis / cellulitis   Ventilator settings: Vent Mode: BIPAP FiO2 (%):  [50 %] 50 % Set Rate:  [10 bmp] 10 bmp PEEP:  [6 cmH20] 6 cmH20  Labs and Imaging:   CXR 1/24 >> Diffuse bilateral airspace disease suspicious for CHF, ? Small bilateral pleural effusions, ? Superimposed PNA   Assessment and Plan: Patient is a 76 yo F with pmhx of CAD, dCHF, and morbid obesity who presents today in acute hypoxic respiratory distress concerning for viral PNA, improving on BiPAP.   PULMONARY  ASSESSMENT: Acute hypoxic respiratory distress - secondary to Influenza/ viral process vs. Bacterial PNA or CHF exacerbation, although later two seem less likely  PLAN:   Empiric abx below  Empiric tamiflu , add stop date 5 days and  ensure dose adjust BiPAP to off, not required Follow ABG Solumedrol 40 BID remain, and reduce over next week to off Duonebs q6 Albuterol q2 prn  O2 support Increased diuresis needed likely, pending bmet  CARDIOVASCULAR  ASSESSMENT:  Troponinemia - ? NSTEMI with new T wave inversions in lateral leads  Hx CAD Hemodynamically stable  PLAN:  Cards consulted per EDP- dont see this note, will call again  Start HCTZ holdign home verapamil  Hold home lasix pending bmet  Heparin gtt    RENAL  ASSESSMENT:   Acute on Chronic renal dysfunction  PLAN:   Likely needs increase lasix, await am labs kvo  GASTROINTESTINAL  ASSESSMENT:   No acute issues PLAN:   Advance diet  HEMATOLOGIC  ASSESSMENT:   Leukocytosis (lymphocyte predominant)  Normocytic anemia (at baseline)  PLAN:  Follow CBC Heparin gtt - will d/w cards duration  INFECTIOUS  ASSESSMENT:   Lactic acidosis  Procalcitonin < 0.10  ? Viral PNA  ? HCAP (unlikley) PLAN:   Aztreonam (PCN allergy) 1/24 >>dc as her resolution is rapid making bacterial PNA unlikely and pct neg ( pcxr c/w viral)  Tamiflu (renally dosed) 1/24 >> 5 days  ENDOCRINE  ASSESSMENT:   Hyperglycemia    PLAN:   SSI Follow CBGs  NEUROLOGIC  ASSESSMENT:   No acute issues  PLAN:   RASS goal 0  No sedation   Family: No family present at bedside. Patient provided history and expressed desire to remain full code.   To triad, tele Mcarthur RossettiDaniel J. Tyson AliasFeinstein, MD, FACP Pgr: [phone] [facility] 07/07/2016 8:40 AM

## 2016-07-07 NOTE — Progress Notes (Signed)
ANTICOAGULATION CONSULT NOTE - Follow Up Consult  Pharmacy Consult for heparin Indication: NSTEMI  Patient Measurements: Height: 5\' 5"  (165.1 cm) Weight: (!) 301 lb 13 oz (136.9 kg) IBW/kg (Calculated) : 57 Heparin Dosing Weight: 90.9 kg  Vital Signs: Temp: 97.9 F (36.6 C) (01/24 2000) Temp Source: Oral (01/24 2000) BP: 129/70 (01/25 0000) Pulse Rate: 72 (01/25 0000)  Labs:  Recent Labs  07/06/16 0106 07/06/16 0356 07/06/16 1427 07/06/16 2326  HGB 9.6* 9.3*  --   --   HCT 29.8* 28.5*  --   --   PLT 367 370  --   --   HEPARINUNFRC  --   --  0.77* 0.76*  CREATININE 1.95* 2.12*  --   --   TROPONINI 3.18* 2.23* 1.93*  --     Estimated Creatinine Clearance: 33.2 mL/min (by C-G formula based on SCr of 2.12 mg/dL (H)).   Assessment: [name] admitted 1/24 for sepsis and NSTEMI, to continue on heparin gtt. Heparin level remains supratherapeutic at 0.76 on 1200 units/hr. No bleeding noted.  Goal of Therapy:  Heparin level 0.3-0.7 units/ml Monitor platelets by anticoagulation protocol: Yes   Plan:  Decrease heparin gtt to 1050 units/hr Check heparin level in 8 hours Daily heparin level, CBC Monitor s/sx of bleeding  Jamie Mason Jamie Mason, PharmD, BCPS Clinical pharmacist, pager [phone] 07/07/2016 12:27 AM

## 2016-07-07 NOTE — Progress Notes (Signed)
ANTICOAGULATION CONSULT NOTE - Follow Up Consult  Pharmacy Consult for heparin Indication: NSTEMI  Patient Measurements: Height: 5\' 5"  (165.1 cm) Weight: (!) 302 lb 0.5 oz (137 kg) IBW/kg (Calculated) : 57 Heparin Dosing Weight: 90.9 kg  Vital Signs: Temp: 97.9 F (36.6 C) (01/25 2052) Temp Source: Oral (01/25 2052) BP: 138/70 (01/25 2052) Pulse Rate: 65 (01/25 2052)  Labs:  Recent Labs  07/06/16 0106 07/06/16 0356  07/06/16 1427 07/06/16 2326 07/07/16 0921 07/07/16 2015  HGB 9.6* 9.3*  --   --   --  9.3*  --   HCT 29.8* 28.5*  --   --   --  28.9*  --   PLT 367 370  --   --   --  399  --   HEPARINUNFRC  --   --   < > 0.77* 0.76* 0.66 0.60  CREATININE 1.95* 2.12*  --   --   --  1.57*  --   TROPONINI 3.18* 2.23*  --  1.93*  --   --   --   < > = values in this interval not displayed.  Estimated Creatinine Clearance: 44.8 mL/min (by C-G formula based on SCr of 1.57 mg/dL (H)).   Assessment: [name] admitted 1/24 for sepsis and NSTEMI, to continue on heparin gtt. Heparin level remains therapeutic at 0.6 on 1050 units/hr. CBC stable. No bleeding noted.  Goal of Therapy:  Heparin level 0.3-0.7 units/ml Monitor platelets by anticoagulation protocol: Yes   Plan:  Heparin gtt to 1050 units/hr Daily heparin level, CBC Monitor s/sx of bleeding   Babs BertinHaley Delcie Ruppert, PharmD, BCPS Clinical Pharmacist 07/07/2016 9:06 PM

## 2016-07-07 NOTE — Progress Notes (Signed)
ANTICOAGULATION CONSULT NOTE - Follow Up Consult  Pharmacy Consult for heparin Indication: NSTEMI  Allergies  Allergen Reactions  . Lisinopril Swelling    Tongue swelling   . Ace Inhibitors Other (See Comments)    unknown  . Penicillins Itching and Rash    Has patient had a PCN reaction causing immediate rash, facial/tongue/throat swelling, SOB or lightheadedness with hypotension: unknown Has patient had a PCN reaction causing severe rash involving mucus membranes or skin necrosis: unknown Has patient had a PCN reaction that required hospitalization: no Has patient had a PCN reaction occurring within the last 10 years: no If all of the above answers are "NO", then may proceed with Cephalosporin use.    Patient Measurements: Height: 5\' 5"  (165.1 cm) Weight: (!) 302 lb 0.5 oz (137 kg) IBW/kg (Calculated) : 57 Heparin Dosing Weight: 91 kg  Vital Signs: Temp: 97.4 F (36.3 C) (01/25 0809) Temp Source: Oral (01/25 0809) BP: 150/76 (01/25 0700) Pulse Rate: 68 (01/25 0700)  Labs:  Recent Labs  07/06/16 0106 07/06/16 0356 07/06/16 1427 07/06/16 2326 07/07/16 0921  HGB 9.6* 9.3*  --   --  9.3*  HCT 29.8* 28.5*  --   --  28.9*  PLT 367 370  --   --  399  HEPARINUNFRC  --   --  0.77* 0.76* 0.66  CREATININE 1.95* 2.12*  --   --   --   TROPONINI 3.18* 2.23* 1.93*  --   --     Estimated Creatinine Clearance: 33.2 mL/min (by C-G formula based on SCr of 2.12 mg/dL (H)). Clearance stable   Assessment: Pt on heparin gtt s/p NSTEMI. Heparin level is therapeutic at 0.66 at a rate of 1050 units/hr. Level was previously supratherapeutic at 0.76 on a rate of 1200 units/hr. No bleeding per RN.  Goal of Therapy:  Heparin level 0.3-0.7 units/ml Monitor platelets by anticoagulation protocol: Yes   Plan:  Continue heparin gtt at 1050 units/hr F/u heparin level in 8 hours Daily heparin level/CBC Monitor for s/sx bleeding  Lianne CureJustin R Rajesh Wyss, PharmD Candidate 07/07/2016,10:16 AM

## 2016-07-07 NOTE — Progress Notes (Signed)
Paged microbiology regarding order PCR, microbiology stated they can run the test from her influenza swab to prevent re swabbing pt, awaiting results  Jamie Mason Elige RadonBradley

## 2016-07-08 DIAGNOSIS — J9621 Acute and chronic respiratory failure with hypoxia: Secondary | ICD-10-CM

## 2016-07-08 DIAGNOSIS — J9600 Acute respiratory failure, unspecified whether with hypoxia or hypercapnia: Secondary | ICD-10-CM

## 2016-07-08 DIAGNOSIS — J111 Influenza due to unidentified influenza virus with other respiratory manifestations: Secondary | ICD-10-CM

## 2016-07-08 LAB — GLUCOSE, CAPILLARY
GLUCOSE-CAPILLARY: 219 mg/dL — AB (ref 65–99)
GLUCOSE-CAPILLARY: 231 mg/dL — AB (ref 65–99)
GLUCOSE-CAPILLARY: 125 mg/dL — AB (ref 65–99)
Glucose-Capillary: 203 mg/dL — ABNORMAL HIGH (ref 65–99)
Glucose-Capillary: 218 mg/dL — ABNORMAL HIGH (ref 65–99)

## 2016-07-08 LAB — CBC
HCT: 28.1 % — ABNORMAL LOW (ref 36.0–46.0)
HEMOGLOBIN: 9 g/dL — AB (ref 12.0–15.0)
MCH: 28.1 pg (ref 26.0–34.0)
MCHC: 32 g/dL (ref 30.0–36.0)
MCV: 87.8 fL (ref 78.0–100.0)
Platelets: 433 10*3/uL — ABNORMAL HIGH (ref 150–400)
RBC: 3.2 MIL/uL — ABNORMAL LOW (ref 3.87–5.11)
RDW: 15.5 % (ref 11.5–15.5)
WBC: 20.6 10*3/uL — ABNORMAL HIGH (ref 4.0–10.5)

## 2016-07-08 LAB — COMPREHENSIVE METABOLIC PANEL
ALT: 39 U/L (ref 14–54)
ANION GAP: 9 (ref 5–15)
AST: 60 U/L — ABNORMAL HIGH (ref 15–41)
Albumin: 2.3 g/dL — ABNORMAL LOW (ref 3.5–5.0)
Alkaline Phosphatase: 53 U/L (ref 38–126)
BUN: 34 mg/dL — ABNORMAL HIGH (ref 6–20)
CALCIUM: 8.3 mg/dL — AB (ref 8.9–10.3)
CHLORIDE: 100 mmol/L — AB (ref 101–111)
CO2: 30 mmol/L (ref 22–32)
CREATININE: 1.44 mg/dL — AB (ref 0.44–1.00)
GFR, EST AFRICAN AMERICAN: 41 mL/min — AB (ref >60)
GFR, EST NON AFRICAN AMERICAN: 35 mL/min — AB (ref >60)
Glucose, Bld: 185 mg/dL — ABNORMAL HIGH (ref 65–99)
Potassium: 3.7 mmol/L (ref 3.5–5.1)
SODIUM: 139 mmol/L (ref 135–145)
Total Bilirubin: 0.8 mg/dL (ref 0.3–1.2)
Total Protein: 7.4 g/dL (ref 6.5–8.1)

## 2016-07-08 LAB — LEGIONELLA PNEUMOPHILA SEROGP 1 UR AG: L. PNEUMOPHILA SEROGP 1 UR AG: NEGATIVE

## 2016-07-08 LAB — HEPARIN LEVEL (UNFRACTIONATED): HEPARIN UNFRACTIONATED: 0.43 [IU]/mL (ref 0.30–0.70)

## 2016-07-08 MED ORDER — CARVEDILOL 6.25 MG PO TABS
6.2500 mg | ORAL_TABLET | Freq: Two times a day (BID) | ORAL | Status: DC
  Administered 2016-07-08 – 2016-07-09 (×3): 6.25 mg via ORAL
  Filled 2016-07-08 (×3): qty 1

## 2016-07-08 MED ORDER — FUROSEMIDE 20 MG PO TABS
20.0000 mg | ORAL_TABLET | Freq: Every day | ORAL | Status: DC
  Administered 2016-07-08 – 2016-07-11 (×4): 20 mg via ORAL
  Filled 2016-07-08 (×4): qty 1

## 2016-07-08 NOTE — Progress Notes (Signed)
Name: Jamie Mason MRN: [id number] DOB: 1941-04-23    LOS: 2  PCCM ADMISSION NOTE  BRIEF SUMMARY: 76 yo F with PMH of dCHF, morbid obesity,admitted 1/24 with SOB. 1/24 she was brought in via EMS from her nursing home for respiratory distress. She was found to have sats in the 80's, fever to 102.5 and was placed on CPAP with improvement.  Influenza panel was positive.  She required BIPAP support with improvement.  Pt was transferred out of ICU 1/25.    She was recently admitted to [facility] 1/14 for CHF exacerbation and cellulitis from her chronic lower extremities wounds. She improved with diuresis and antibiotics.   SUBJECTIVE:  Pt denies SOB, feels like she is improving.  RN reports hematuria.    Vital Signs: Temp:  [97.9 F (36.6 C)-98.4 F (36.9 C)] 97.9 F (36.6 C) (01/26 0944) Pulse Rate:  [65-84] 84 (01/26 0741) Resp:  [19-20] 19 (01/26 0741) BP: (132-139)/(48-74) 136/74 (01/26 0741) SpO2:  [96 %-100 %] 97 % (01/26 0741) FiO2 (%):  [28 %] 28 % (01/25 1325) Weight:  [295 lb 1.6 oz (133.9 kg)] 295 lb 1.6 oz (133.9 kg) (01/26 4098) I/O last 3 completed shifts: In: 2095.8 [P.O.:1440; I.V.:555.8; IV Piggyback:100] Out: 300 [Urine:300]  Physical Examination: General: chronically ill appearing female, in NAD HEENT: MM pink/moist Neuro: AAOx4, speech clear, MAE, sitting on bedside commode CV: s1s2 rrr, no m/r/g PULM: non-labored, lungs bilaterally diminished but clear JX:BJYN, non-tender, bsx4 active  Extremities: warm/dry, trace LE edema  Skin: no rashes or lesions   Ventilator settings: FiO2 (%):  [28 %] 28 %  Labs and Imaging:   CXR 1/24 >> Diffuse bilateral airspace disease suspicious for CHF, ? Small bilateral pleural effusions, ? Superimposed PNA    Cultures: Blood 1/24 >> Urine 1/24 >> greater 100k unidentified organism >> Respiratory 1/24 >> RVP 1/24 >> Influenza A H3 Influenza PCR 1/24 >>POS  Antibiotics: Levofloxacin 1/24 >> one dose in ED  Vanc 1/24  >> 1/24 Aztreonam (PCN allergy) 1/24 >> 1/25 tamiflu 1/24 >>   Tests / Events: 1/24  Admit, flu pos, BIPAP 1/25  no NIMV improved status, improved pcxr  DISCUSSION: Patient is a 76 yo F with pmhx of CAD, dCHF, and morbid obesity who presents today in acute hypoxic respiratory distress concerning for viral PNA, improving on BiPAP.   PULMONARY Acute hypoxic respiratory distress - secondary to Influenza/ viral process, +/- CHF exacerbation P:   Tamiflu, D3/5 Continue Solumedrol 40 mg IV Q12 with taper to off  Duonebs Q6 + PRN albuterol  Wean O2 for sats >92% Diuresis as renal function / BP allows  CARDIOVASCULAR Troponinemia - ? NSTEMI with new T wave inversions in lateral leads  Hx CAD P:  Cardiology following Continue Coreg Lasix 20 mg PO QD Heparin gtt per pharmacy, hold 1/26 with hematuria  RENAL Acute on Chronic Renal Failure  Hematuria - on heparin gtt P:   Lasix as above Trend BMP / UOP  Replace electrolytes as indicated  Monitor hematuria  GASTROINTESTINAL  Morbid Obesity  P:   Diet as tolerated   HEMATOLOGIC Leukocytosis (lymphocyte predominant)  Normocytic anemia (at baseline)  P:  Trend CBC  Heparin per pharmacy  Hold heparin 1/26, reassess 1/27 for restart in setting of hematuria  INFECTIOUS Influenza A Positive  Sepsis Syndrome secondary to above PLAN:   Monitor off abx Continue Tamiflu, D3/5 Supportive therapies   ENDOCRINE Hyperglycemia    P:   SSI Follow CBGs  Family: No family at bedside, patient updated on plan of care.    Global: To TRH as of 1/27 am.  PCCM will sign off.   Canary BrimBrandi Kerly Rigsbee, NP-C [facility] Pgr: [phone] or if no answer [phone] 07/08/2016, 10:11 AM

## 2016-07-08 NOTE — Progress Notes (Signed)
Pt having blood in her urine. Pt stable, states she is not in pain. Paged MD awaiting call back  Kemoni Ortega Elige RadonBradley

## 2016-07-08 NOTE — Progress Notes (Addendum)
ANTICOAGULATION CONSULT NOTE - Follow Up Consult  Pharmacy Consult for heparin Indication: NSTEMI  Patient Measurements: Height: 5\' 5"  (165.1 cm) Weight: 295 lb 1.6 oz (133.9 kg) (Scale B) IBW/kg (Calculated) : 57 Heparin Dosing Weight: 90.9 kg  Vital Signs: Temp: 98.2 F (36.8 C) (01/26 0741) Temp Source: Oral (01/26 0741) BP: 136/74 (01/26 0741) Pulse Rate: 84 (01/26 0741)  Labs:  Recent Labs  07/06/16 0106 07/06/16 0356 07/06/16 1427  07/07/16 0921 07/07/16 2015 07/08/16 0424  HGB 9.6* 9.3*  --   --  9.3*  --  9.0*  HCT 29.8* 28.5*  --   --  28.9*  --  28.1*  PLT 367 370  --   --  399  --  433*  HEPARINUNFRC  --   --  0.77*  < > 0.66 0.60 0.43  CREATININE 1.95* 2.12*  --   --  1.57*  --  1.44*  TROPONINI 3.18* 2.23* 1.93*  --   --   --   --   < > = values in this interval not displayed.  Estimated Creatinine Clearance: 48.2 mL/min (by C-G formula based on SCr of 1.44 mg/dL (H)).   Assessment: [name] admitted 1/24 for sepsis and NSTEMI, to continue on heparin gtt. Heparin level remains therapeutic at 0.43 on 1050 units/hr. CBC stable. No bleeding noted.  Goal of Therapy:  Heparin level 0.3-0.7 units/ml Monitor platelets by anticoagulation protocol: Yes   Plan:  Heparin 1050 units/hr Daily heparin level, CBC Monitor s/sx of bleeding  Arlean Hoppingorey M. Newman PiesBall, PharmD, BCPS Clinical Pharmacist [phone] 07/08/2016 8:17 AM   ADDN: Patient has hematuria. Have recommended to hold heparin for now and after speaking with CCM, will check back in with patient to possibly restart tomorrow. Recommended SCDs in the meantime.  Arlean Hoppingorey M. Newman PiesBall, PharmD, BCPS Clinical Pharmacist [phone]

## 2016-07-08 NOTE — Progress Notes (Signed)
Talked to pharmacist Jamie Mason regarding lasix order. Pt hydrochlorothiazide discontinued, pt received dose this morning. Pharmacist stated it is okay for her to begin lasix dose today.  Jamie Mason

## 2016-07-08 NOTE — Progress Notes (Signed)
Paged on call NP Brandi, aware of pt bloody urine, she stated to call pharmacy. Talked to V[place] Altaorey from pharmacy he discontinued patients IV heparin, blood thinner. Called cardiologist to inform, talked to Coralee Northina, she's aware, no further orders, will continue to monitor    Aric Jost

## 2016-07-08 NOTE — Progress Notes (Signed)
Progress Note  Patient Name: Jamie Mason Date of Encounter: 07/08/2016  Primary Cardiologist: New Dr Eden Emms (previous pt of Dr. Donnie Aho - last seen in 2012)  Subjective   No chest pain. Has DOE with walking and getting up to [facility] Has orthopnea.  Inpatient Medications    Scheduled Meds: . aspirin EC  81 mg Oral Daily  . atorvastatin  40 mg Oral q1800  . chlorhexidine  15 mL Mouth Rinse BID  . hydrochlorothiazide  25 mg Oral Daily  . insulin aspart  0-15 Units Subcutaneous TID WC & HS  . ipratropium-albuterol  3 mL Nebulization TID  . mouth rinse  15 mL Mouth Rinse q12n4p  . methylPREDNISolone (SOLU-MEDROL) injection  40 mg Intravenous Q12H  . oseltamivir  30 mg Oral BID  . senna-docusate  1 tablet Oral BID   Continuous Infusions: . heparin 1,050 Units/hr (07/07/16 2027)   PRN Meds: sodium chloride, acetaminophen, albuterol, ondansetron (ZOFRAN) IV   Vital Signs    Vitals:   07/07/16 2052 07/07/16 2213 07/08/16 0608 07/08/16 0741  BP: 138/70  (!) 139/48 136/74  Pulse: 65  77 84  Resp: 20  19 19   Temp: 97.9 F (36.6 C)  98.4 F (36.9 C) 98.2 F (36.8 C)  TempSrc: Oral  Oral Oral  SpO2: 96% 96% 99% 97%  Weight:   295 lb 1.6 oz (133.9 kg)   Height:        Intake/Output Summary (Last 24 hours) at 07/08/16 0944 Last data filed at 07/08/16 0941  Gross per 24 hour  Intake           2027.5 ml  Output              300 ml  Net           1727.5 ml   Filed Weights   07/06/16 0635 07/07/16 0300 07/08/16 0608  Weight: (!) 301 lb 13 oz (136.9 kg) (!) 302 lb 0.5 oz (137 kg) 295 lb 1.6 oz (133.9 kg)    Telemetry    Sinus rhythm in the 80's with PAF up to 130's with exertion - Personally Reviewed  ECG    No new tracings  Physical Exam   GEN: Obese AA female, No acute distress.  Neck: No JVD Cardiac: Irregularly irregular rhythm, soft, harsh 2/6 murmur best heard at RUSB Respiratory: Lungs with few coarse crackles in posterior bases GI: Soft, nontender,  non-distended  MS: Trace lower leg edema, lower legs with darkened skin; No deformity. Neuro:  AAOx3. Psych: Normal affect  Labs    Chemistry Recent Labs Lab 07/06/16 0135 07/06/16 0356 07/07/16 0921 07/08/16 0424  NA  --  138 140 139  K  --  3.4* 3.9 3.7  CL  --  96* 101 100*  CO2  --  24 28 30   GLUCOSE  --  233* 195* 185*  BUN  --  23* 29* 34*  CREATININE  --  2.12* 1.57* 1.44*  CALCIUM  --  8.3* 8.4* 8.3*  PROT 7.8  --  7.5 7.4  ALBUMIN 2.7*  --  2.5* 2.3*  AST 76*  --  99* 60*  ALT 30  --  45 39  ALKPHOS 58  --  52 53  BILITOT 0.5  --  0.6 0.8  GFRNONAA  --  22* 32* 35*  GFRAA  --  25* 37* 41*  ANIONGAP  --  18* 11 9     Hematology Recent Labs Lab 07/06/16  [id number] 07/07/16 0921 07/08/16 0424  WBC 16.2* 24.2* 20.6*  RBC 3.26* 3.29* 3.20*  HGB 9.3* 9.3* 9.0*  HCT 28.5* 28.9* 28.1*  MCV 87.4 87.8 87.8  MCH 28.5 28.3 28.1  MCHC 32.6 32.2 32.0  RDW 15.7* 15.8* 15.5  PLT 370 399 433*    Cardiac Enzymes Recent Labs Lab 07/06/16 0106 07/06/16 0356 07/06/16 1427  TROPONINI 3.18* 2.23* 1.93*    Recent Labs Lab 07/06/16 0111  TROPIPOC 6.22*     BNP Recent Labs Lab 07/06/16 0106  BNP 731.2*     DDimer No results for input(s): DDIMER in the last 168 hours.   Radiology    Dg Chest Port 1 View  Result Date: 07/07/2016 CLINICAL DATA:  Respiratory distress EXAM: PORTABLE CHEST 1 VIEW COMPARISON:  07/06/2016 FINDINGS: Stable cardiopericardial enlargement. Hazy opacity bilaterally, borderline improved. There is cephalized blood flow. Mild atelectasis or scarring the left mid lung. No visible effusion or pneumothorax. IMPRESSION: CHF pattern with borderline improvement since yesterday. Electronically Signed   By: Marnee SpringJonathon  Watts M.D.   On: 07/07/2016 06:55    Cardiac Studies   Echo: 06/27/16 Study Conclusions  - Left ventricle: LVEF is normal at 60 to 65% with distal inferior hypokinesis. The cavity size was normal. Wall thickness was increased  in a pattern of moderate LVH. Systolic function was normal. The estimated ejection fraction was in the range of 60% to 65%. Features are consistent with a pseudonormal left ventricular filling pattern, with concomitant abnormal relaxation and increased filling pressure (grade 2 diastolic dysfunction). Doppler parameters are consistent with high ventricular filling pressure. - Mitral valve: There was mild regurgitation. - Pulmonary arteries: PA peak pressure: 47 mm Hg (S).  Cardiac cath 05/03/2011 IMPRESSIONS: 1. 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 Moderate disease involving the left coronary system-predominantly the LAD 3. Normal left particular systolic function with left ventricular hypertrophy and increased LVEDP with minimal inferobasal hypokinesis  Patient Profile     76 y.o. female with a history of CAD, Hypertensive heart disease, hyperlipidemia, diabetes, lumbar disc problem, morbid obesity, former smoker and recent admission to [facility] who presents with shortness of breath.  Assessment & Plan    1. NSTEMI - Troponin trend 3.18-->2.23-->1.93. EKG with new nonspecific changes. She does have atypical chest pain chronically. Last cath in 2012 as above. She will need ischemic evaluation when stable from a respiratory perspective. Cath  Monday with Dr. Okey DupreEnd. Will need cath order based on labs and respiratory status over weekend. Continue heparin. Chest pain free.   2. Chronic diastolic CHF - BNP is trending down from last admission. CXR with ? CHF. Echo 1/15 showed normal LV function, moderate LVH, grade DD, mild MR and PA peak pressure: 47 mm Hg (S). Got Iv lasix 60mg  x 1 on 07/06/16 . Also given fluids. -Is back on home HCTZ 25 mg daily -Still has DOE and orthopnea, few crackles in bases of lungs. Urine output is likely not accurate. Will give a dose of IV lasix 40 mg today.  3. Chronic  venous statis  4. AKI - Scr maxed at 2.12 on 1/24, trending down to 1.44 today  5. HTN - Stable. Continue HCTZ.   6. Anemia -Hgb 9.0, stable. Was 8.2 on 06/28/16 during last admission.  7. PAF -Confirm with EKG. -Aim for rate control at this time -CHA2DS2/VAS is at least 5 (HTN, Age, female, DM, CAD). She is currently anticoagulated with  IV heparin. Will need to consider DOAC at discharge.  Signed, Berton Bon, NP  07/08/2016, 9:44 AM    Breathing much better. No active wheezing Telemetry with short bursts PAF less than 5 seconds Will add coreg / beta blocker . She has known CAD with total RCA with collaterals. If PAF continues Would add amiodarone over weekend Cath planned for Monday Still with plus 2 LE edeam Change Diuretic to lasix  Charlton Haws

## 2016-07-09 ENCOUNTER — Inpatient Hospital Stay (HOSPITAL_COMMUNITY): Payer: Medicare HMO

## 2016-07-09 LAB — BLOOD GAS, ARTERIAL
ACID-BASE EXCESS: 7.7 mmol/L — AB (ref 0.0–2.0)
Bicarbonate: 32 mmol/L — ABNORMAL HIGH (ref 20.0–28.0)
DRAWN BY: 365271
FIO2: 28
O2 Saturation: 96.7 %
PH ART: 7.45 (ref 7.350–7.450)
Patient temperature: 97.7
pCO2 arterial: 46.5 mmHg (ref 32.0–48.0)
pO2, Arterial: 88.1 mmHg (ref 83.0–108.0)

## 2016-07-09 LAB — GLUCOSE, CAPILLARY
GLUCOSE-CAPILLARY: 255 mg/dL — AB (ref 65–99)
GLUCOSE-CAPILLARY: 214 mg/dL — AB (ref 65–99)
GLUCOSE-CAPILLARY: 263 mg/dL — AB (ref 65–99)
Glucose-Capillary: 282 mg/dL — ABNORMAL HIGH (ref 65–99)

## 2016-07-09 LAB — BASIC METABOLIC PANEL
ANION GAP: 11 (ref 5–15)
BUN: 40 mg/dL — AB (ref 6–20)
CHLORIDE: 97 mmol/L — AB (ref 101–111)
CO2: 30 mmol/L (ref 22–32)
Calcium: 8.6 mg/dL — ABNORMAL LOW (ref 8.9–10.3)
Creatinine, Ser: 1.28 mg/dL — ABNORMAL HIGH (ref 0.44–1.00)
GFR calc Af Amer: 47 mL/min — ABNORMAL LOW (ref >60)
GFR calc non Af Amer: 40 mL/min — ABNORMAL LOW (ref >60)
GLUCOSE: 204 mg/dL — AB (ref 65–99)
POTASSIUM: 5 mmol/L (ref 3.5–5.1)
Sodium: 138 mmol/L (ref 135–145)

## 2016-07-09 LAB — CBC
HEMATOCRIT: 30.4 % — AB (ref 36.0–46.0)
Hemoglobin: 9.6 g/dL — ABNORMAL LOW (ref 12.0–15.0)
MCH: 27.9 pg (ref 26.0–34.0)
MCHC: 31.6 g/dL (ref 30.0–36.0)
MCV: 88.4 fL (ref 78.0–100.0)
Platelets: 461 10*3/uL — ABNORMAL HIGH (ref 150–400)
RBC: 3.44 MIL/uL — AB (ref 3.87–5.11)
RDW: 15.8 % — ABNORMAL HIGH (ref 11.5–15.5)
WBC: 18.4 10*3/uL — AB (ref 4.0–10.5)

## 2016-07-09 MED ORDER — DILTIAZEM HCL 100 MG IV SOLR
5.0000 mg/h | INTRAVENOUS | Status: DC
  Administered 2016-07-09: 15 mg/h via INTRAVENOUS
  Administered 2016-07-09: 5 mg/h via INTRAVENOUS
  Administered 2016-07-10: 15 mg/h via INTRAVENOUS
  Filled 2016-07-09 (×5): qty 100

## 2016-07-09 MED ORDER — CARVEDILOL 6.25 MG PO TABS
6.2500 mg | ORAL_TABLET | ORAL | Status: AC
  Administered 2016-07-09: 6.25 mg via ORAL
  Filled 2016-07-09: qty 1

## 2016-07-09 MED ORDER — CARVEDILOL 12.5 MG PO TABS
12.5000 mg | ORAL_TABLET | Freq: Two times a day (BID) | ORAL | Status: DC
  Administered 2016-07-09 – 2016-07-13 (×8): 12.5 mg via ORAL
  Filled 2016-07-09 (×9): qty 1

## 2016-07-09 NOTE — Progress Notes (Signed)
HR maintaining 130s PA made aware new orders to start Cardizem gtts. Will continue to monitor. No c/o chest pain, dizziness or palpitations.

## 2016-07-09 NOTE — Progress Notes (Signed)
Paged by nurse as patient's HR was in 130s, unable to take systemic anticoagulation or IV heparin due to bleeding. Telemetry reviewed, looks like she was in sinus rhythm yesterday and spontaneously went into 2:1 atrial flutter. I doubt increase coreg from 12.5mg  to 25mg  will effectively control her HR. Will add diltiazem gtt, uptitrate slowly based on HR and BP.  Ramond DialSigned, Abdurahman Rugg PA Pager: [phone]

## 2016-07-09 NOTE — Progress Notes (Signed)
Pt HR 130's MD made aware. Orders received. Pt in no distress at this time. Lying in bed.

## 2016-07-09 NOTE — Progress Notes (Signed)
Pt still having bloody urine. L. Sofia PA notified, advised to wait for the result of hgb. Will continue to monitor.

## 2016-07-09 NOTE — Progress Notes (Signed)
PROGRESS NOTE    Jamie Mason  ZOX:[id number] DOB: 1941/05/29 DOA: 07/06/2016 PCP: Blanch Media, MD   Chief Complaint  Patient presents with  . Respiratory Distress    Brief Narrative:  76 yo F with PMH of dCHF, morbid obesity,admitted 1/24 with SOB. 1/24 she was brought in via EMS from her nursing home for respiratory distress. She was found to have sats in the 80's, fever to 102.5 and was placed on CPAP with improvement.  Influenza panel was positive.  She required BIPAP support with improvement.  Pt was transferred out of ICU 1/25.   Assessment & Plan   Acute hypoxic respiratory distress -Secondary to influenza, CHF exacerbation -Patient required BiPAP support upon admission, currently on nasal cannula -Continue supplemental oxygen to maintain saturations above 92% -Continue steroids, nebs, Tamiflu  Sepsis secondary to Influenza A -Patient presented with tachycardia, tachypnea, leukocytosis with an elevated lactic acid of 4.49 -leukocytosis improving -Treatment plan as above  NSTEMI/CAD -Cardiology consulted and appreciated -patient has known occlusion of RCA -Continue Coreg, statin, aspirin -Heparin held due to hematuria -Plan for heart catheterization on 07/11/2016  Acute on chronic diastolic heart failure -Appears to be improving -Echocardiogram 06/27/2016 showed an EF of 60-65%, grade 2 diastolic dysfunction -Patient currently appears to be euvolemic -Continue Lasix -Monitor intake and output, daily weights  AKI on CKD, stage III -Creatinine peaked to 1.95, currently trending downward -Creatinine 1.28 today -Continue to monitor BMP  Paroxsymal Atrial fibrillation -Continue coreg, aspirin  -Heparin held due to hematuria  Hematuria -Possibly secondary to heparin drip- currently held -hemoglobin 9.6  ?UTI -Urine culture >100K [facility] -Will repeat at [facility] not convincing of infection and patient denies any dysuria  Normocytic anemia -Hemoglobin currently  9.6 -Continue to monitor CBC  DVT Prophylaxis  SCDs  Code Status: Full  Family Communication: None at bedside  Disposition Plan: Admitted. Pending cath on 1/29. SNF when medically stable  Consultants Cardiology PCCM  Procedures  None  Antibiotics   Anti-infectives    Start     Dose/Rate Route Frequency Ordered Stop   07/08/16 0400  vancomycin (VANCOCIN) 1,750 mg in sodium chloride 0.9 % 500 mL IVPB  Status:  Discontinued     1,750 mg 250 mL/hr over 120 Minutes Intravenous Every 48 hours 07/06/16 0409 07/06/16 1011   07/08/16 0000  levofloxacin (LEVAQUIN) IVPB 750 mg  Status:  Discontinued     750 mg 100 mL/hr over 90 Minutes Intravenous Every 48 hours 07/06/16 0409 07/06/16 0413   07/06/16 1400  aztreonam (AZACTAM) 1 g in dextrose 5 % 50 mL IVPB  Status:  Discontinued     1 g 100 mL/hr over 30 Minutes Intravenous Every 8 hours 07/06/16 0409 07/07/16 0848   07/06/16 1000  oseltamivir (TAMIFLU) capsule 30 mg  Status:  Discontinued     30 mg Oral Daily 07/06/16 0347 07/06/16 0424   07/06/16 1000  oseltamivir (TAMIFLU) capsule 30 mg     30 mg Oral 2 times daily 07/06/16 0424 07/11/16 0959   07/06/16 0230  vancomycin (VANCOCIN) 1,500 mg in sodium chloride 0.9 % 500 mL IVPB     1,500 mg 250 mL/hr over 120 Minutes Intravenous  Once 07/06/16 0202 07/06/16 0834   07/06/16 0200  levofloxacin (LEVAQUIN) IVPB 750 mg     750 mg 100 mL/hr over 90 Minutes Intravenous  Once 07/06/16 0155 07/06/16 0444   07/06/16 0200  aztreonam (AZACTAM) 2 g in dextrose 5 % 50 mL IVPB     2 g  100 mL/hr over 30 Minutes Intravenous  Once 07/06/16 0155 07/06/16 0512   07/06/16 0200  vancomycin (VANCOCIN) IVPB 1000 mg/200 mL premix  Status:  Discontinued     1,000 mg 200 mL/hr over 60 Minutes Intravenous  Once 07/06/16 0155 07/06/16 0200      Subjective:   [name] seen and examined today.  Patient states she is feeling better. She does continue to have shortness of breath with exertion or  movement. She denies any current chest pain, abdominal pain, nausea or vomiting, dizziness or headache.   Objective:   Vitals:   07/09/16 0433 07/09/16 0830 07/09/16 1004 07/09/16 1036  BP: (!) 133/92 127/62 131/75 (!) 141/70  Pulse: (!) 126 (!) 130 (!) 131 91  Resp: 18 18 20 20   Temp: 98.2 F (36.8 C)   98.1 F (36.7 C)  TempSrc: Oral   Oral  SpO2: 100% 100% 100% 100%  Weight: 135.9 kg (299 lb 9.6 oz)     Height:        Intake/Output Summary (Last 24 hours) at 07/09/16 1238 Last data filed at 07/09/16 0852  Gross per 24 hour  Intake             1200 ml  Output              252 ml  Net              948 ml   Filed Weights   07/07/16 0300 07/08/16 0608 07/09/16 0433  Weight: (!) 137 kg (302 lb 0.5 oz) 133.9 kg (295 lb 1.6 oz) 135.9 kg (299 lb 9.6 oz)    Exam  General: Well developed, well nourished, NAD, appears stated age  HEENT: NCAT,mucous membranes moist.   Cardiovascular: S1 S2 auscultated, no rubs, murmurs or gallops. Regular rate and rhythm.  Respiratory: Diminished, coarse breath sounds, occ cough  Abdomen: Soft, nontender, nondistended, + bowel sounds  Extremities: warm dry without cyanosis clubbing or edema  Neuro: AAOx3, nonfocal  Psych: Normal affect and demeanor with intact judgement and insight   Data Reviewed: I have personally reviewed following labs and imaging studies  CBC:  Recent Labs Lab 07/06/16 0106 07/06/16 0356 07/07/16 0921 07/08/16 0424 07/09/16 0419  WBC 12.5* 16.2* 24.2* 20.6* 18.4*  NEUTROABS 6.5  --  20.9*  --   --   HGB 9.6* 9.3* 9.3* 9.0* 9.6*  HCT 29.8* 28.5* 28.9* 28.1* 30.4*  MCV 88.2 87.4 87.8 87.8 88.4  PLT 367 370 399 433* 461*   Basic Metabolic Panel:  Recent Labs Lab 07/06/16 0106 07/06/16 0356 07/07/16 0921 07/08/16 0424 07/09/16 0419  NA 135 138 140 139 138  K 4.2 3.4* 3.9 3.7 5.0  CL 94* 96* 101 100* 97*  CO2 23 24 28 30 30   GLUCOSE 207* 233* 195* 185* 204*  BUN 22* 23* 29* 34* 40*  CREATININE  1.95* 2.12* 1.57* 1.44* 1.28*  CALCIUM 8.5* 8.3* 8.4* 8.3* 8.6*   GFR: Estimated Creatinine Clearance: 54.8 mL/min (by C-G formula based on SCr of 1.28 mg/dL (H)). Liver Function Tests:  Recent Labs Lab 07/06/16 0135 07/07/16 0921 07/08/16 0424  AST 76* 99* 60*  ALT 30 45 39  ALKPHOS 58 52 53  BILITOT 0.5 0.6 0.8  PROT 7.8 7.5 7.4  ALBUMIN 2.7* 2.5* 2.3*   No results for input(s): LIPASE, AMYLASE in the last 168 hours. No results for input(s): AMMONIA in the last 168 hours. Coagulation Profile: No results for input(s): INR, PROTIME in the  last 168 hours. Cardiac Enzymes:  Recent Labs Lab 07/06/16 0106 07/06/16 0356 07/06/16 1427  TROPONINI 3.18* 2.23* 1.93*   BNP (last 3 results) No results for input(s): PROBNP in the last 8760 hours. HbA1C: No results for input(s): HGBA1C in the last 72 hours. CBG:  Recent Labs Lab 07/08/16 1149 07/08/16 1647 07/08/16 2224 07/09/16 0611 07/09/16 1155  GLUCAP 218* 231* 125* 255* 214*   Lipid Profile: No results for input(s): CHOL, HDL, LDLCALC, TRIG, CHOLHDL, LDLDIRECT in the last 72 hours. Thyroid Function Tests: No results for input(s): TSH, T4TOTAL, FREET4, T3FREE, THYROIDAB in the last 72 hours. Anemia Panel: No results for input(s): VITAMINB12, FOLATE, FERRITIN, TIBC, IRON, RETICCTPCT in the last 72 hours. Urine analysis:    Component Value Date/Time   COLORURINE AMBER (A) 07/07/2016 1040   APPEARANCEUR CLOUDY (A) 07/07/2016 1040   LABSPEC 1.017 07/07/2016 1040   PHURINE 5.0 07/07/2016 1040   GLUCOSEU NEGATIVE 07/07/2016 1040   HGBUR LARGE (A) 07/07/2016 1040   BILIRUBINUR NEGATIVE 07/07/2016 1040   BILIRUBINUR Negative 03/19/2014 1631   KETONESUR NEGATIVE 07/07/2016 1040   PROTEINUR 30 (A) 07/07/2016 1040   UROBILINOGEN 0.2 03/19/2014 1631   NITRITE NEGATIVE 07/07/2016 1040   LEUKOCYTESUR SMALL (A) 07/07/2016 1040   Sepsis Labs: @LABRCNTIP (procalcitonin:4,lacticidven:4)  ) Recent Results (from the past  240 hour(s))  Respiratory Panel by PCR     Status: Abnormal   Collection Time: 07/06/16  1:00 AM  Result Value Ref Range Status   Adenovirus NOT DETECTED NOT DETECTED Final   Coronavirus 229E NOT DETECTED NOT DETECTED Final   Coronavirus HKU1 NOT DETECTED NOT DETECTED Final   Coronavirus NL63 NOT DETECTED NOT DETECTED Final   Coronavirus OC43 NOT DETECTED NOT DETECTED Final   Metapneumovirus NOT DETECTED NOT DETECTED Final   Rhinovirus / Enterovirus NOT DETECTED NOT DETECTED Final   Influenza A H3 DETECTED (A) NOT DETECTED Final   Influenza B NOT DETECTED NOT DETECTED Final   Parainfluenza Virus 1 NOT DETECTED NOT DETECTED Final   Parainfluenza Virus 2 NOT DETECTED NOT DETECTED Final   Parainfluenza Virus 3 NOT DETECTED NOT DETECTED Final   Parainfluenza Virus 4 NOT DETECTED NOT DETECTED Final   Respiratory Syncytial Virus NOT DETECTED NOT DETECTED Final   Bordetella pertussis NOT DETECTED NOT DETECTED Final   Chlamydophila pneumoniae NOT DETECTED NOT DETECTED Final   Mycoplasma pneumoniae NOT DETECTED NOT DETECTED Final  Blood Culture (routine x 2)     Status: None (Preliminary result)   Collection Time: 07/06/16  1:05 AM  Result Value Ref Range Status   Specimen Description BLOOD LEFT ARM  Final   Special Requests BOTTLES DRAWN AEROBIC AND ANAEROBIC  Final   Culture NO GROWTH 3 DAYS  Final   Report Status PENDING  Incomplete  Blood Culture (routine x 2)     Status: None (Preliminary result)   Collection Time: 07/06/16  1:35 AM  Result Value Ref Range Status   Specimen Description BLOOD RIGHT HAND  Final   Special Requests BOTTLES DRAWN AEROBIC AND ANAEROBIC  Final   Culture NO GROWTH 3 DAYS  Final   Report Status PENDING  Incomplete  MRSA PCR Screening     Status: None   Collection Time: 07/06/16  6:47 AM  Result Value Ref Range Status   MRSA by PCR NEGATIVE NEGATIVE Final    Comment:        The GeneXpert MRSA Assay (FDA approved for NASAL specimens only), is one  component of a  comprehensive MRSA colonization surveillance program. It is not intended to diagnose MRSA infection nor to guide or monitor treatment for MRSA infections.   Urine culture     Status: Abnormal (Preliminary result)   Collection Time: 07/06/16 10:40 AM  Result Value Ref Range Status   Specimen Description URINE, RANDOM  Final   Special Requests NONE  Final   Culture (A)  Final    >=100,000 COLONIES/mL GRAM POSITIVE COCCI IDENTIFICATION AND SUSCEPTIBILITIES TO FOLLOW    Report Status PENDING  Incomplete      Radiology Studies: Dg Chest Port 1 View  Result Date: 07/09/2016 CLINICAL DATA:  Acute on chronic respiratory failure EXAM: PORTABLE CHEST 1 VIEW COMPARISON:  07/07/2016 FINDINGS: Cardiac enlargement. Improved aeration of the lungs compared with prior study. Decreased bilateral airspace disease. No significant effusion. IMPRESSION: Interval improvement in diffuse bilateral airspace disease. Electronically Signed   By: Marlan Palauharles  Clark M.D.   On: 07/09/2016 07:03     Scheduled Meds: . aspirin EC  81 mg Oral Daily  . atorvastatin  40 mg Oral q1800  . carvedilol  12.5 mg Oral BID WC  . chlorhexidine  15 mL Mouth Rinse BID  . furosemide  20 mg Oral Daily  . insulin aspart  0-15 Units Subcutaneous TID WC & HS  . ipratropium-albuterol  3 mL Nebulization TID  . mouth rinse  15 mL Mouth Rinse q12n4p  . methylPREDNISolone (SOLU-MEDROL) injection  40 mg Intravenous Q12H  . oseltamivir  30 mg Oral BID  . senna-docusate  1 tablet Oral BID   Continuous Infusions:   LOS: 3 days   Time Spent in minutes   30 minutes  Citlally Captain D.O. on 07/09/2016 at 12:38 PM  Between 7am to 7pm - Pager - [phone]  After 7pm go to [email or web address] - password [email or web address]  And look for the night coverage person covering for me after hours  [facility] Office  [phone]

## 2016-07-09 NOTE — Progress Notes (Signed)
Hr remains in the 130s Cardiology PA made aware. Pt in no distress at this time.

## 2016-07-09 NOTE — Progress Notes (Signed)
ANTICOAGULATION CONSULT NOTE - Follow Up Consult  Pharmacy Consult for heparin Indication: NSTEMI  Patient Measurements: Height: 5\' 5"  (165.1 cm) Weight: 299 lb 9.6 oz (135.9 kg) (Scale B) IBW/kg (Calculated) : 57 Heparin Dosing Weight: 90.9 kg  Vital Signs: Temp: 98.2 F (36.8 C) (01/27 0433) Temp Source: Oral (01/27 0433) BP: 133/92 (01/27 0433) Pulse Rate: 126 (01/27 0433)  Labs:  Recent Labs  07/06/16 1427  07/07/16 0921 07/07/16 2015 07/08/16 0424 07/09/16 0419  HGB  --   < > 9.3*  --  9.0* 9.6*  HCT  --   --  28.9*  --  28.1* 30.4*  PLT  --   --  399  --  433* 461*  HEPARINUNFRC 0.77*  < > 0.66 0.60 0.43  --   CREATININE  --   --  1.57*  --  1.44* 1.28*  TROPONINI 1.93*  --   --   --   --   --   < > = values in this interval not displayed.  Estimated Creatinine Clearance: 54.8 mL/min (by C-G formula based on SCr of 1.28 mg/dL (H)).   Assessment: [age] yoF admitted 1/24 for sepsis and NSTEMI, started on heparin drip. Pt reported hematuria 1/26 so heparin was held per CCM. Care transferred to hospitalist service on 1/27 - per Dr. Catha GosselinMikhail continue to hold heparin as hematuria has continued. Hgb slightly improved today to 9.6, no other S/Sx bleeding noted.  Goal of Therapy:  Heparin level 0.3-0.7 units/ml Monitor platelets by anticoagulation protocol: Yes   Plan:  -Continue to hold heparin -Continue SCDs -Monitor hematuria, S/Sx bleeding, CBC closely -F/U plans for cath  Fredonia HighlandMichael Bitonti, PharmD PGY-1 Pharmacy Resident Pager: [phone] 07/09/2016

## 2016-07-09 NOTE — Progress Notes (Signed)
Pt HR remains in 130s increased cardizem gtt Bp 133/92

## 2016-07-09 NOTE — Progress Notes (Signed)
Pt HR remained elevated 130s Dr. Tiburcio PeaHarris made aware. Orders to increase 15mg /hr. BP 126/88. Pt denies chest pain, dizziness, or palpitations. Family at the bedside, education provided.

## 2016-07-09 NOTE — Progress Notes (Signed)
Subjective:  Says her shortness of breath is better at rest but worse if she gets up and walks around.  No chest pain.  Objective:  Vital Signs in the last 24 hours: BP (!) 141/70 (BP Location: Right Arm)   Pulse 91   Temp 98.1 F (36.7 C) (Oral)   Resp 20   Ht 5\' 5"  (1.651 m)   Wt 135.9 kg (299 lb 9.6 oz) Comment: Scale B  SpO2 100%   BMI 49.86 kg/m   Physical Exam: Severely obese black female in no acute distress Lungs: Few coarse rhonchi in the bases Cardiac:  Regular rhythm, normal S1 and S2, no S3 Abdomen:  Soft, nontender, no masses Extremities:  No edema present  Intake/Output from previous day: 01/26 0701 - 01/27 0700 In: 1436 [P.O.:1436] Out: 952 [Urine:950; Stool:2]  Weight Filed Weights   07/07/16 0300 07/08/16 0608 07/09/16 0433  Weight: (!) 137 kg (302 lb 0.5 oz) 133.9 kg (295 lb 1.6 oz) 135.9 kg (299 lb 9.6 oz)    Lab Results: Basic Metabolic Panel:  Recent Labs  09/81/1901/26/18 0424 07/09/16 0419  NA 139 138  K 3.7 5.0  CL 100* 97*  CO2 30 30  GLUCOSE 185* 204*  BUN 34* 40*  CREATININE 1.44* 1.28*   CBC:  Recent Labs  07/07/16 0921 07/08/16 0424 07/09/16 0419  WBC 24.2* 20.6* 18.4*  NEUTROABS 20.9*  --   --   HGB 9.3* 9.0* 9.6*  HCT 28.9* 28.1* 30.4*  MCV 87.8 87.8 88.4  PLT 399 433* 461*   Cardiac Enzymes:  Cardiac Panel (last 3 results)  Recent Labs  07/06/16 1427  TROPONINI 1.93*    Telemetry: Rapid regular heart rate of around 133-unclear whether the sinus tachycardia or could be atrial flutter or fibrillation. Personally reviewed  Assessment/Plan:  1.  Non-STEMI-cath planned on Monday 2.  Chronic diastolic heart failure clinically improved 3.  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  Acute on chronic kidney disease improving 5.  Hypertensive heart disease 6.  Morbid obesity 7.  Probable paroxysmal atrial fibrillation or flutter  Recommendations:  Obtain EKG to verify rhythm.  It looks like flutter  or fibrillation to me.  Continue to monitor renal function.  Continue heparin.  Catheterization planned Monday.     Darden PalmerW. Spencer Zane Samson, Jr.  MD [facility] Cardiology  07/09/2016, 11:47 AM

## 2016-07-10 LAB — CBC
HCT: 29.4 % — ABNORMAL LOW (ref 36.0–46.0)
Hemoglobin: 9.2 g/dL — ABNORMAL LOW (ref 12.0–15.0)
MCH: 27.5 pg (ref 26.0–34.0)
MCHC: 31.3 g/dL (ref 30.0–36.0)
MCV: 87.8 fL (ref 78.0–100.0)
PLATELETS: 428 10*3/uL — AB (ref 150–400)
RBC: 3.35 MIL/uL — AB (ref 3.87–5.11)
RDW: 15.7 % — AB (ref 11.5–15.5)
WBC: 17.3 10*3/uL — ABNORMAL HIGH (ref 4.0–10.5)

## 2016-07-10 LAB — URINALYSIS, ROUTINE W REFLEX MICROSCOPIC
BACTERIA UA: NONE SEEN
BILIRUBIN URINE: NEGATIVE
Glucose, UA: 50 mg/dL — AB
KETONES UR: NEGATIVE mg/dL
NITRITE: NEGATIVE
PH: 6 (ref 5.0–8.0)
Protein, ur: NEGATIVE mg/dL
Specific Gravity, Urine: 1.018 (ref 1.005–1.030)
Squamous Epithelial / LPF: NONE SEEN

## 2016-07-10 LAB — GLUCOSE, CAPILLARY
GLUCOSE-CAPILLARY: 254 mg/dL — AB (ref 65–99)
Glucose-Capillary: 256 mg/dL — ABNORMAL HIGH (ref 65–99)
Glucose-Capillary: 315 mg/dL — ABNORMAL HIGH (ref 65–99)
Glucose-Capillary: 427 mg/dL — ABNORMAL HIGH (ref 65–99)

## 2016-07-10 LAB — BASIC METABOLIC PANEL
Anion gap: 8 (ref 5–15)
BUN: 33 mg/dL — AB (ref 6–20)
CALCIUM: 8.6 mg/dL — AB (ref 8.9–10.3)
CO2: 29 mmol/L (ref 22–32)
CREATININE: 1.06 mg/dL — AB (ref 0.44–1.00)
Chloride: 101 mmol/L (ref 101–111)
GFR calc Af Amer: 59 mL/min — ABNORMAL LOW (ref >60)
GFR, EST NON AFRICAN AMERICAN: 51 mL/min — AB (ref >60)
Glucose, Bld: 250 mg/dL — ABNORMAL HIGH (ref 65–99)
POTASSIUM: 4.3 mmol/L (ref 3.5–5.1)
SODIUM: 138 mmol/L (ref 135–145)

## 2016-07-10 LAB — URINE CULTURE: Culture: >=100000 — AB

## 2016-07-10 LAB — HEPARIN LEVEL (UNFRACTIONATED): HEPARIN UNFRACTIONATED: 0.32 [IU]/mL (ref 0.30–0.70)

## 2016-07-10 MED ORDER — SODIUM CHLORIDE 0.9% FLUSH: 3.0000 mL | INTRAVENOUS | Status: DC | PRN

## 2016-07-10 MED ORDER — SODIUM CHLORIDE 0.9% FLUSH
3.0000 mL | Freq: Two times a day (BID) | INTRAVENOUS | Status: DC
  Administered 2016-07-10 – 2016-07-11 (×2): 3 mL via INTRAVENOUS

## 2016-07-10 MED ORDER — SODIUM CHLORIDE 0.9 % WEIGHT BASED INFUSION
1.0000 mL/kg/h | INTRAVENOUS | Status: DC
  Administered 2016-07-11 (×2): 1 mL/kg/h via INTRAVENOUS

## 2016-07-10 MED ORDER — SODIUM CHLORIDE 0.9 % IV SOLN: 250.0000 mL | INTRAVENOUS | Status: DC | PRN

## 2016-07-10 MED ORDER — NITROFURANTOIN MONOHYD MACRO 100 MG PO CAPS
100.0000 mg | ORAL_CAPSULE | Freq: Two times a day (BID) | ORAL | Status: AC
  Administered 2016-07-10 – 2016-07-14 (×10): 100 mg via ORAL
  Filled 2016-07-10 (×12): qty 1

## 2016-07-10 MED ORDER — ASPIRIN 81 MG PO CHEW
81.0000 mg | CHEWABLE_TABLET | ORAL | Status: AC
  Administered 2016-07-11: 81 mg via ORAL
  Filled 2016-07-10: qty 1

## 2016-07-10 MED ORDER — HEPARIN (PORCINE) IN NACL 100-0.45 UNIT/ML-% IJ SOLN
1100.0000 [IU]/h | INTRAMUSCULAR | Status: DC
Start: 2016-07-10 — End: 2016-07-11
  Administered 2016-07-10: 1050 [IU]/h via INTRAVENOUS
  Administered 2016-07-11: 1100 [IU]/h via INTRAVENOUS
  Filled 2016-07-10 (×2): qty 250

## 2016-07-10 MED ORDER — DILTIAZEM HCL-DEXTROSE 100-5 MG/100ML-% IV SOLN (PREMIX)
5.0000 mg/h | INTRAVENOUS | Status: DC
  Administered 2016-07-11 (×3): 15 mg/h via INTRAVENOUS
  Administered 2016-07-12: 20 mg/h via INTRAVENOUS
  Administered 2016-07-12 (×2): 15 mg/h via INTRAVENOUS
  Filled 2016-07-10 (×8): qty 100

## 2016-07-10 MED ORDER — SODIUM CHLORIDE 0.9 % WEIGHT BASED INFUSION
3.0000 mL/kg/h | INTRAVENOUS | Status: DC
  Administered 2016-07-11: 3 mL/kg/h via INTRAVENOUS

## 2016-07-10 NOTE — Consult Note (Signed)
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 Right IT: 3cm x 5cm x 0.2cm with moist red base.  Left IT with 3cm x 7cm wound with base obscured by the presence of yellow fibrinous slough. Wound bed: Right IT and right posterior thigh with red, moist wound beds, left IT with yellow slough obscuring wound bed. Drainage (amount, consistency, odor) Scant serous to light yellow Periwound:Intact Dressing procedure/placement/frequency: Patient with moisture and shear creating ulcerations in the ischial tuberosity area from sitting.  POC to include topical care with silver hydrofiber dressings, therapeutic air mattress and keeping HOB at or below a 30 degree angle.I have provided a pressure redistribution chair pad for her use in the post acute setting since sitting is the main contributing factor in the etiology of her ulcerations. WOC nursing team will not follow, but will remain available to this patient, the nursing and medical teams.  Please re-consult if needed. Thanks, Ladona MowLaurie Delsie Amador, MSN, RN, GNP, Hans EdenCWOCN, CWON-AP, FAAN  Pager# [phone]

## 2016-07-10 NOTE — Progress Notes (Signed)
ANTICOAGULATION CONSULT NOTE - Follow Up Consult  Pharmacy Consult for heparin Indication: NSTEMI  Patient Measurements: Height: 5\' 5"  (165.1 cm) Weight: 299 lb (135.6 kg) IBW/kg (Calculated) : 57 Heparin Dosing Weight: 90.9 kg  Vital Signs: Temp: 97.4 F (36.3 C) (01/28 0738) Temp Source: Oral (01/28 0738) BP: 120/73 (01/28 0738) Pulse Rate: 134 (01/28 0738)  Labs:  Recent Labs  07/07/16 2015  07/08/16 0424 07/09/16 0419 07/10/16 0502  HGB  --   < > 9.0* 9.6* 9.2*  HCT  --   --  28.1* 30.4* 29.4*  PLT  --   --  433* 461* 428*  HEPARINUNFRC 0.60  --  0.43  --   --   CREATININE  --   --  1.44* 1.28* 1.06*  < > = values in this interval not displayed.  Estimated Creatinine Clearance: 66 mL/min (by C-G formula based on SCr of 1.06 mg/dL (H)).   Assessment: [age] yoF admitted 1/24 for sepsis and NSTEMI, started on heparin drip. Pt reported hematuria 1/26 so heparin was held per CCM. Care transferred to hospitalist service on 1/27 - per Dr. Catha GosselinMikhail continue to hold heparin as hematuria has continued, now on 1/28 Dr. Catha GosselinMikhail wishes to resume heparin infusion. Heparin previously therapeutic x2 at 1050 units/hr, will resume with no bolus. Hgb stable, no S/Sx hematuria this morning per RN, no other overt S/Sx bleeding noted.  Goal of Therapy:  Heparin level 0.3-0.7 units/ml Monitor platelets by anticoagulation protocol: Yes   Plan:  -Heparin 1050 units/hr -8-hr heparin level -Monitor hematuria, S/Sx bleeding, CBC, heparin level daily -F/U plans for cath  Fredonia HighlandMichael Oneka Parada, PharmD PGY-1 Pharmacy Resident Pager: [phone] 07/10/2016

## 2016-07-10 NOTE — Progress Notes (Signed)
Subjective:  Breathing is better today.  EKG confirmed that she was in atrial flutter with 2 to one block.  She was started on intravenous diltiazem and is currently running around 108 now.  No ischemic chest pain.  Objective:  Vital Signs in the last 24 hours: BP 120/73 (BP Location: Left Arm)   Pulse (!) 134   Temp 97.4 F (36.3 C) (Oral)   Resp 20   Ht 5\' 5"  (1.651 m)   Wt 135.6 kg (299 lb)   SpO2 97%   BMI 49.76 kg/m   Physical Exam: Severely obese black female in no acute distress Lungs: Few coarse rhonchi in the bases Cardiac: Irregular rhythm, normal S1 and S2, no S3 Abdomen:  Soft, nontender, no masses, quite large Extremities:  No edema present  Intake/Output from previous day: 01/27 0701 - 01/28 0700 In: 960 [P.O.:960] Out: 500 [Urine:500]  Weight Filed Weights   07/08/16 0608 07/09/16 0433 07/10/16 0300  Weight: 133.9 kg (295 lb 1.6 oz) 135.9 kg (299 lb 9.6 oz) 135.6 kg (299 lb)    Lab Results: Basic Metabolic Panel:  Recent Labs  16/03/9600/27/18 0419 07/10/16 0502  NA 138 138  K 5.0 4.3  CL 97* 101  CO2 30 29  GLUCOSE 204* 250*  BUN 40* 33*  CREATININE 1.28* 1.06*   CBC:  Recent Labs  07/09/16 0419 07/10/16 0502  WBC 18.4* 17.3*  HGB 9.6* 9.2*  HCT 30.4* 29.4*  MCV 88.4 87.8  PLT 461* 428*   Lab Results  Component Value Date   CREATININE 1.06 (H) 07/10/2016   BUN 33 (H) 07/10/2016   NA 138 07/10/2016   K 4.3 07/10/2016   CL 101 07/10/2016   CO2 29 07/10/2016   Telemetry: Atrial flutter with variable response, current rate around 108. Personally reviewed  Assessment/Plan:  1.  Non-STEMI-cath planned on Monday 2.  Chronic diastolic heart failure clinically improved 3.  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  Acute renal failure-resolved 5.  Hypertensive heart disease 6.  Morbid obesity 7.  Atrial flutter  Recommendations:  Continue intravenous diltiazem.  Continue heparin at the present time.  No function is  stable today.  Catheterization is planned for tomorrow.Cardiac catheterization was discussed with the patient fully including risks of myocardial infarction, death, stroke, bleeding, arrhythmia, dye allergy, renal insufficiency or bleeding.  The patient understands and is willing to proceed.  Possibility of intervention at the same time also discussed with patient and they understand and are agreeable to proceed   W. Ashley RoyaltySpencer Tilley, Jr.  MD [facility] Cardiology  07/10/2016, 11:08 AM

## 2016-07-10 NOTE — Progress Notes (Signed)
Pt resting quietly no needs

## 2016-07-10 NOTE — Progress Notes (Signed)
ANTICOAGULATION CONSULT NOTE - Follow Up Consult  Pharmacy Consult for heparin Indication: NSTEMI  Patient Measurements: Height: 5\' 5"  (165.1 cm) Weight: 299 lb (135.6 kg) IBW/kg (Calculated) : 57 Heparin Dosing Weight: 90.9 kg  Vital Signs: Temp: 97.9 F (36.6 C) (01/28 1602) Temp Source: Oral (01/28 1602) BP: 141/65 (01/28 1602) Pulse Rate: 94 (01/28 1602)  Labs:  Recent Labs  07/07/16 2015  07/08/16 0424 07/09/16 0419 07/10/16 0502 07/10/16 1807  HGB  --   < > 9.0* 9.6* 9.2*  --   HCT  --   --  28.1* 30.4* 29.4*  --   PLT  --   --  433* 461* 428*  --   HEPARINUNFRC 0.60  --  0.43  --   --  0.32  CREATININE  --   --  1.44* 1.28* 1.06*  --   < > = values in this interval not displayed.  Estimated Creatinine Clearance: 66 mL/min (by C-G formula based on SCr of 1.06 mg/dL (H)).   Assessment: [age] yoF admitted 1/24 for sepsis and NSTEMI, started on heparin drip. Pt reported hematuria 1/26 so heparin was held per CCM. Care transferred to hospitalist service on 1/27 - per Dr. Catha GosselinMikhail continue to hold heparin as hematuria has continued, now on 1/28 Dr. Catha GosselinMikhail wishes to resume heparin infusion. Heparin previously therapeutic x2 at 1050 units/hr, resumed with no bolus. Hgb stable, no S/Sx hematuria this morning per RN, no other overt S/Sx bleeding noted.  Initial level within goal at 0.32  Goal of Therapy:  Heparin level 0.3-0.7 units/ml Monitor platelets by anticoagulation protocol: Yes   Plan:  -Heparin 1050 units/hr -Monitor hematuria, S/Sx bleeding, CBC, heparin level daily -F/U plans for cath  Isaac BlissMichael Rosela Supak, PharmD, BCPS, BCCCP Clinical Pharmacist 07/10/2016 7:04 PM

## 2016-07-10 NOTE — Progress Notes (Signed)
PROGRESS NOTE    Jamie Mason  YQM:[id number] DOB: 1941-02-03 DOA: 07/06/2016 PCP: Blanch Media, MD   Chief Complaint  Patient presents with  . Respiratory Distress    Brief Narrative:  76 yo F with PMH of dCHF, morbid obesity,admitted 1/24 with SOB. 1/24 she was brought in via EMS from her nursing home for respiratory distress. She was found to have sats in the 80's, fever to 102.5 and was placed on CPAP with improvement.  Influenza panel was positive.  She required BIPAP support with improvement.  Pt was transferred out of ICU 1/25.   Assessment & Plan   Acute hypoxic respiratory distress -Secondary to influenza, CHF exacerbation -Patient required BiPAP support upon admission, currently on nasal cannula -Continue supplemental oxygen to maintain saturations above 92% -Continue steroids, nebs, Tamiflu  Sepsis secondary to Influenza A -Patient presented with tachycardia, tachypnea, leukocytosis with an elevated lactic acid of 4.49 -leukocytosis improving -Treatment plan as above  NSTEMI/CAD -Cardiology consulted and appreciated -patient has known occlusion of RCA -Continue Coreg, statin, aspirin -Heparin held due to hematuria- will restart heparin today and monitor closely -Plan for heart catheterization on 07/11/2016  Acute on chronic diastolic heart failure -Appears to be improving -Echocardiogram 06/27/2016 showed an EF of 60-65%, grade 2 diastolic dysfunction -Patient currently appears to be euvolemic -Continue Lasix -Monitor intake and output, daily weights  AKI on CKD, stage III -Creatinine peaked to 1.95, currently trending downward -Creatinine 1.28 today -Continue to monitor BMP  Paroxsymal Atrial fibrillation -Continue coreg, aspirin  -Heparin held due to hematuria- restarted today -Patient HR was in the 130s yesterday. Coreg increased but ineffective. Placed on cardizem drip -Continue cardizem  Hematuria -Possibly secondary to heparin  drip -hemoglobin 9.2  ?UTI -Urine culture >100K GPC- VRE -Repeat UA/culture pending not convincing of infection and patient denies any dysuria -Will start patient on macrobid  Normocytic anemia -Hemoglobin currently 9.2 -Continue to monitor CBC  DVT Prophylaxis  SCDs, Heparin  Code Status: Full  Family Communication: None at bedside  Disposition Plan: Admitted. Pending cath on 1/29. SNF when medically stable  Consultants Cardiology PCCM  Procedures  None  Antibiotics   Anti-infectives    Start     Dose/Rate Route Frequency Ordered Stop   07/08/16 0400  vancomycin (VANCOCIN) 1,750 mg in sodium chloride 0.9 % 500 mL IVPB  Status:  Discontinued     1,750 mg 250 mL/hr over 120 Minutes Intravenous Every 48 hours 07/06/16 0409 07/06/16 1011   07/08/16 0000  levofloxacin (LEVAQUIN) IVPB 750 mg  Status:  Discontinued     750 mg 100 mL/hr over 90 Minutes Intravenous Every 48 hours 07/06/16 0409 07/06/16 0413   07/06/16 1400  aztreonam (AZACTAM) 1 g in dextrose 5 % 50 mL IVPB  Status:  Discontinued     1 g 100 mL/hr over 30 Minutes Intravenous Every 8 hours 07/06/16 0409 07/07/16 0848   07/06/16 1000  oseltamivir (TAMIFLU) capsule 30 mg  Status:  Discontinued     30 mg Oral Daily 07/06/16 0347 07/06/16 0424   07/06/16 1000  oseltamivir (TAMIFLU) capsule 30 mg     30 mg Oral 2 times daily 07/06/16 0424 07/11/16 0959   07/06/16 0230  vancomycin (VANCOCIN) 1,500 mg in sodium chloride 0.9 % 500 mL IVPB     1,500 mg 250 mL/hr over 120 Minutes Intravenous  Once 07/06/16 0202 07/06/16 0834   07/06/16 0200  levofloxacin (LEVAQUIN) IVPB 750 mg     750 mg 100 mL/hr over 90  Minutes Intravenous  Once 07/06/16 0155 07/06/16 0444   07/06/16 0200  aztreonam (AZACTAM) 2 g in dextrose 5 % 50 mL IVPB     2 g 100 mL/hr over 30 Minutes Intravenous  Once 07/06/16 0155 07/06/16 0512   07/06/16 0200  vancomycin (VANCOCIN) IVPB 1000 mg/200 mL premix  Status:  Discontinued     1,000 mg 200 mL/hr  over 60 Minutes Intravenous  Once 07/06/16 0155 07/06/16 0200      Subjective:   Jamie BruceGloria Mason seen and examined today.  Patient states she is feeling better and her breathing has improved.  Denies any current chest pain, abdominal pain, nausea or vomiting, dizziness or headache.   Objective:   Vitals:   07/09/16 1941 07/10/16 0300 07/10/16 0600 07/10/16 0738  BP: 129/79 106/84  120/73  Pulse: (!) 127 (!) 131 (!) 106 (!) 134  Resp: 18 20  20   Temp: 98.3 F (36.8 C) 98.4 F (36.9 C)  97.4 F (36.3 C)  TempSrc: Axillary Oral  Oral  SpO2: 98% 98%  97%  Weight:  135.6 kg (299 lb)    Height:        Intake/Output Summary (Last 24 hours) at 07/10/16 1120 Last data filed at 07/10/16 0939  Gross per 24 hour  Intake              960 ml  Output              800 ml  Net              160 ml   Filed Weights   07/08/16 0608 07/09/16 0433 07/10/16 0300  Weight: 133.9 kg (295 lb 1.6 oz) 135.9 kg (299 lb 9.6 oz) 135.6 kg (299 lb)    Exam  General: Well developed, well nourished, NAD, appears stated age  HEENT: NCAT,mucous membranes moist.   Cardiovascular: S1 S2 auscultated, no rubs, murmurs or gallops. irregular  Respiratory: Diminished, coarse breath sounds  Abdomen: Soft, obese, nontender, nondistended, + bowel sounds  Extremities: warm dry without cyanosis clubbing or edema  Neuro: AAOx3, nonfocal  Psych: Normal affect and demeanor    Data Reviewed: I have personally reviewed following labs and imaging studies  CBC:  Recent Labs Lab 07/06/16 0106 07/06/16 0356 07/07/16 0921 07/08/16 0424 07/09/16 0419 07/10/16 0502  WBC 12.5* 16.2* 24.2* 20.6* 18.4* 17.3*  NEUTROABS 6.5  --  20.9*  --   --   --   HGB 9.6* 9.3* 9.3* 9.0* 9.6* 9.2*  HCT 29.8* 28.5* 28.9* 28.1* 30.4* 29.4*  MCV 88.2 87.4 87.8 87.8 88.4 87.8  PLT 367 370 399 433* 461* 428*   Basic Metabolic Panel:  Recent Labs Lab 07/06/16 0356 07/07/16 0921 07/08/16 0424 07/09/16 0419 07/10/16 0502   NA 138 140 139 138 138  K 3.4* 3.9 3.7 5.0 4.3  CL 96* 101 100* 97* 101  CO2 24 28 30 30 29   GLUCOSE 233* 195* 185* 204* 250*  BUN 23* 29* 34* 40* 33*  CREATININE 2.12* 1.57* 1.44* 1.28* 1.06*  CALCIUM 8.3* 8.4* 8.3* 8.6* 8.6*   GFR: Estimated Creatinine Clearance: 66 mL/min (by C-G formula based on SCr of 1.06 mg/dL (H)). Liver Function Tests:  Recent Labs Lab 07/06/16 0135 07/07/16 0921 07/08/16 0424  AST 76* 99* 60*  ALT 30 45 39  ALKPHOS 58 52 53  BILITOT 0.5 0.6 0.8  PROT 7.8 7.5 7.4  ALBUMIN 2.7* 2.5* 2.3*   No results for input(s): LIPASE, AMYLASE in the  last 168 hours. No results for input(s): AMMONIA in the last 168 hours. Coagulation Profile: No results for input(s): INR, PROTIME in the last 168 hours. Cardiac Enzymes:  Recent Labs Lab 07/06/16 0106 07/06/16 0356 07/06/16 1427  TROPONINI 3.18* 2.23* 1.93*   BNP (last 3 results) No results for input(s): PROBNP in the last 8760 hours. HbA1C: No results for input(s): HGBA1C in the last 72 hours. CBG:  Recent Labs Lab 07/09/16 0611 07/09/16 1155 07/09/16 1656 07/09/16 2117 07/10/16 0623  GLUCAP 255* 214* 263* 282* 254*   Lipid Profile: No results for input(s): CHOL, HDL, LDLCALC, TRIG, CHOLHDL, LDLDIRECT in the last 72 hours. Thyroid Function Tests: No results for input(s): TSH, T4TOTAL, FREET4, T3FREE, THYROIDAB in the last 72 hours. Anemia Panel: No results for input(s): VITAMINB12, FOLATE, FERRITIN, TIBC, IRON, RETICCTPCT in the last 72 hours. Urine analysis:    Component Value Date/Time   COLORURINE YELLOW 07/10/2016 1021   APPEARANCEUR CLEAR 07/10/2016 1021   LABSPEC 1.018 07/10/2016 1021   PHURINE 6.0 07/10/2016 1021   GLUCOSEU 50 (A) 07/10/2016 1021   HGBUR SMALL (A) 07/10/2016 1021   BILIRUBINUR NEGATIVE 07/10/2016 1021   BILIRUBINUR Negative 03/19/2014 1631   KETONESUR NEGATIVE 07/10/2016 1021   PROTEINUR NEGATIVE 07/10/2016 1021   UROBILINOGEN 0.2 03/19/2014 1631   NITRITE  NEGATIVE 07/10/2016 1021   LEUKOCYTESUR SMALL (A) 07/10/2016 1021   Sepsis Labs: @LABRCNTIP (procalcitonin:4,lacticidven:4)  ) Recent Results (from the past 240 hour(s))  Respiratory Panel by PCR     Status: Abnormal   Collection Time: 07/06/16  1:00 AM  Result Value Ref Range Status   Adenovirus NOT DETECTED NOT DETECTED Final   Coronavirus 229E NOT DETECTED NOT DETECTED Final   Coronavirus HKU1 NOT DETECTED NOT DETECTED Final   Coronavirus NL63 NOT DETECTED NOT DETECTED Final   Coronavirus OC43 NOT DETECTED NOT DETECTED Final   Metapneumovirus NOT DETECTED NOT DETECTED Final   Rhinovirus / Enterovirus NOT DETECTED NOT DETECTED Final   Influenza A H3 DETECTED (A) NOT DETECTED Final   Influenza B NOT DETECTED NOT DETECTED Final   Parainfluenza Virus 1 NOT DETECTED NOT DETECTED Final   Parainfluenza Virus 2 NOT DETECTED NOT DETECTED Final   Parainfluenza Virus 3 NOT DETECTED NOT DETECTED Final   Parainfluenza Virus 4 NOT DETECTED NOT DETECTED Final   Respiratory Syncytial Virus NOT DETECTED NOT DETECTED Final   Bordetella pertussis NOT DETECTED NOT DETECTED Final   Chlamydophila pneumoniae NOT DETECTED NOT DETECTED Final   Mycoplasma pneumoniae NOT DETECTED NOT DETECTED Final  Blood Culture (routine x 2)     Status: None (Preliminary result)   Collection Time: 07/06/16  1:05 AM  Result Value Ref Range Status   Specimen Description BLOOD LEFT ARM  Final   Special Requests BOTTLES DRAWN AEROBIC AND ANAEROBIC  Final   Culture NO GROWTH 3 DAYS  Final   Report Status PENDING  Incomplete  Blood Culture (routine x 2)     Status: None (Preliminary result)   Collection Time: 07/06/16  1:35 AM  Result Value Ref Range Status   Specimen Description BLOOD RIGHT HAND  Final   Special Requests BOTTLES DRAWN AEROBIC AND ANAEROBIC  Final   Culture NO GROWTH 3 DAYS  Final   Report Status PENDING  Incomplete  MRSA PCR Screening     Status: None   Collection Time: 07/06/16  6:47 AM   Result Value Ref Range Status   MRSA by PCR NEGATIVE NEGATIVE Final    Comment:  The GeneXpert MRSA Assay (FDA approved for NASAL specimens only), is one component of a comprehensive MRSA colonization surveillance program. It is not intended to diagnose MRSA infection nor to guide or monitor treatment for MRSA infections.   Urine culture     Status: Abnormal   Collection Time: 07/06/16 10:40 AM  Result Value Ref Range Status   Specimen Description URINE, RANDOM  Final   Special Requests NONE  Final   Culture (A)  Final    >=100,000 COLONIES/mL VANCOMYCIN RESISTANT ENTEROCOCCUS   Report Status 07/10/2016 FINAL  Final   Organism ID, Bacteria VANCOMYCIN RESISTANT ENTEROCOCCUS (A)  Final      Susceptibility   Vancomycin resistant enterococcus - MIC*    AMPICILLIN <=2 SENSITIVE Sensitive     LEVOFLOXACIN >=8 RESISTANT Resistant     NITROFURANTOIN <=16 SENSITIVE Sensitive     VANCOMYCIN >=32 RESISTANT Resistant     LINEZOLID 2 SENSITIVE Sensitive     * >=100,000 COLONIES/mL VANCOMYCIN RESISTANT ENTEROCOCCUS      Radiology Studies: Dg Chest Port 1 View  Result Date: 07/09/2016 CLINICAL DATA:  Acute on chronic respiratory failure EXAM: PORTABLE CHEST 1 VIEW COMPARISON:  07/07/2016 FINDINGS: Cardiac enlargement. Improved aeration of the lungs compared with prior study. Decreased bilateral airspace disease. No significant effusion. IMPRESSION: Interval improvement in diffuse bilateral airspace disease. Electronically Signed   By: Marlan Palau M.D.   On: 07/09/2016 07:03     Scheduled Meds: . aspirin EC  81 mg Oral Daily  . atorvastatin  40 mg Oral q1800  . carvedilol  12.5 mg Oral BID WC  . chlorhexidine  15 mL Mouth Rinse BID  . furosemide  20 mg Oral Daily  . insulin aspart  0-15 Units Subcutaneous TID WC & HS  . mouth rinse  15 mL Mouth Rinse q12n4p  . methylPREDNISolone (SOLU-MEDROL) injection  40 mg Intravenous Q12H  . nitrofurantoin (macrocrystal-monohydrate)   100 mg Oral Q12H  . oseltamivir  30 mg Oral BID  . senna-docusate  1 tablet Oral BID   Continuous Infusions: . diltiazem (CARDIZEM) infusion 15 mg/hr (07/10/16 0455)  . heparin 1,050 Units/hr (07/10/16 0950)     LOS: 4 days   Time Spent in minutes   30 minutes  Gorden Stthomas D.O. on 07/10/2016 at 11:20 AM  Between 7am to 7pm - Pager - [phone]  After 7pm go to [email or web address] - password [email or web address]  And look for the night coverage person covering for me after hours  [facility] Office  [phone]

## 2016-07-11 ENCOUNTER — Encounter (HOSPITAL_COMMUNITY): Payer: Self-pay | Admitting: Physician Assistant

## 2016-07-11 ENCOUNTER — Encounter (HOSPITAL_COMMUNITY)
Admission: EM | Disposition: A | Discharge status: Home Health | Payer: Self-pay | Source: Home / Self Care | Attending: Internal Medicine

## 2016-07-11 DIAGNOSIS — I4892 Unspecified atrial flutter: Secondary | ICD-10-CM

## 2016-07-11 DIAGNOSIS — I214 Non-ST elevation (NSTEMI) myocardial infarction: Secondary | ICD-10-CM

## 2016-07-11 DIAGNOSIS — J111 Influenza due to unidentified influenza virus with other respiratory manifestations: Secondary | ICD-10-CM

## 2016-07-11 DIAGNOSIS — I2510 Atherosclerotic heart disease of native coronary artery without angina pectoris: Secondary | ICD-10-CM

## 2016-07-11 HISTORY — DX: Unspecified atrial flutter: I48.92

## 2016-07-11 HISTORY — PX: CARDIAC CATHETERIZATION: SHX172

## 2016-07-11 LAB — BASIC METABOLIC PANEL
Anion gap: 7 (ref 5–15)
BUN: 30 mg/dL — ABNORMAL HIGH (ref 6–20)
CHLORIDE: 102 mmol/L (ref 101–111)
CO2: 29 mmol/L (ref 22–32)
CREATININE: 1.13 mg/dL — AB (ref 0.44–1.00)
Calcium: 8.8 mg/dL — ABNORMAL LOW (ref 8.9–10.3)
GFR calc non Af Amer: 47 mL/min — ABNORMAL LOW (ref >60)
GFR, EST AFRICAN AMERICAN: 54 mL/min — AB (ref >60)
GLUCOSE: 272 mg/dL — AB (ref 65–99)
Potassium: 4.4 mmol/L (ref 3.5–5.1)
Sodium: 138 mmol/L (ref 135–145)

## 2016-07-11 LAB — CULTURE, BLOOD (ROUTINE X 2)
CULTURE: NO GROWTH
Culture: NO GROWTH

## 2016-07-11 LAB — CBC
HEMATOCRIT: 28.8 % — AB (ref 36.0–46.0)
Hemoglobin: 9.1 g/dL — ABNORMAL LOW (ref 12.0–15.0)
MCH: 27.8 pg (ref 26.0–34.0)
MCHC: 31.6 g/dL (ref 30.0–36.0)
MCV: 88.1 fL (ref 78.0–100.0)
Platelets: 432 10*3/uL — ABNORMAL HIGH (ref 150–400)
RBC: 3.27 MIL/uL — ABNORMAL LOW (ref 3.87–5.11)
RDW: 15.7 % — AB (ref 11.5–15.5)
WBC: 19.8 10*3/uL — AB (ref 4.0–10.5)

## 2016-07-11 LAB — GLUCOSE, CAPILLARY
GLUCOSE-CAPILLARY: 309 mg/dL — AB (ref 65–99)
GLUCOSE-CAPILLARY: 211 mg/dL — AB (ref 65–99)
Glucose-Capillary: 256 mg/dL — ABNORMAL HIGH (ref 65–99)
Glucose-Capillary: 229 mg/dL — ABNORMAL HIGH (ref 65–99)
Glucose-Capillary: 229 mg/dL — ABNORMAL HIGH (ref 65–99)

## 2016-07-11 LAB — URINE CULTURE: Culture: NO GROWTH

## 2016-07-11 LAB — HEPARIN LEVEL (UNFRACTIONATED)
HEPARIN UNFRACTIONATED: 0.51 [IU]/mL (ref 0.30–0.70)
Heparin Unfractionated: 0.29 IU/mL — ABNORMAL LOW (ref 0.30–0.70)

## 2016-07-11 LAB — PROTIME-INR
INR: 1.14
PROTHROMBIN TIME: 14.6 s (ref 11.4–15.2)

## 2016-07-11 SURGERY — LEFT HEART CATH AND CORONARY ANGIOGRAPHY
Anesthesia: LOCAL

## 2016-07-11 MED ORDER — IOPAMIDOL (ISOVUE-370) INJECTION 76%
INTRAVENOUS | Status: DC | PRN
  Administered 2016-07-11: 110 mL via INTRA_ARTERIAL

## 2016-07-11 MED ORDER — SODIUM CHLORIDE 0.9% FLUSH
3.0000 mL | Freq: Two times a day (BID) | INTRAVENOUS | Status: DC
  Administered 2016-07-12 – 2016-07-16 (×8): 3 mL via INTRAVENOUS

## 2016-07-11 MED ORDER — SODIUM CHLORIDE 0.9% FLUSH: 3.0000 mL | INTRAVENOUS | Status: DC | PRN

## 2016-07-11 MED ORDER — MIDAZOLAM HCL 2 MG/2ML IJ SOLN
INTRAMUSCULAR | Status: AC
  Filled 2016-07-11: qty 2

## 2016-07-11 MED ORDER — IOPAMIDOL (ISOVUE-370) INJECTION 76%
INTRAVENOUS | Status: AC
  Filled 2016-07-11: qty 100

## 2016-07-11 MED ORDER — HEPARIN (PORCINE) IN NACL 100-0.45 UNIT/ML-% IJ SOLN
1150.0000 [IU]/h | INTRAMUSCULAR | Status: AC
  Administered 2016-07-12: 1100 [IU]/h via INTRAVENOUS
  Filled 2016-07-11: qty 250

## 2016-07-11 MED ORDER — SODIUM CHLORIDE 0.9% FLUSH
3.0000 mL | Freq: Two times a day (BID) | INTRAVENOUS | Status: DC
  Administered 2016-07-11 – 2016-07-15 (×7): 3 mL via INTRAVENOUS

## 2016-07-11 MED ORDER — METHYLPREDNISOLONE SODIUM SUCC 40 MG IJ SOLR
40.0000 mg | Freq: Every day | INTRAMUSCULAR | Status: DC
  Administered 2016-07-12 – 2016-07-13 (×2): 40 mg via INTRAVENOUS
  Filled 2016-07-11 (×2): qty 1

## 2016-07-11 MED ORDER — HEPARIN SODIUM (PORCINE) 1000 UNIT/ML IJ SOLN
INTRAMUSCULAR | Status: AC
  Filled 2016-07-11: qty 1

## 2016-07-11 MED ORDER — VERAPAMIL HCL 2.5 MG/ML IV SOLN
INTRAVENOUS | Status: AC
  Filled 2016-07-11: qty 2

## 2016-07-11 MED ORDER — HEPARIN (PORCINE) IN NACL 2-0.9 UNIT/ML-% IJ SOLN
INTRAMUSCULAR | Status: AC
  Filled 2016-07-11: qty 500

## 2016-07-11 MED ORDER — FENTANYL CITRATE (PF) 100 MCG/2ML IJ SOLN
INTRAMUSCULAR | Status: DC | PRN
  Administered 2016-07-11 (×2): 25 ug via INTRAVENOUS

## 2016-07-11 MED ORDER — LIDOCAINE HCL (PF) 1 % IJ SOLN
INTRAMUSCULAR | Status: AC
Start: 2016-07-11 — End: 2016-07-11
  Filled 2016-07-11: qty 30

## 2016-07-11 MED ORDER — FUROSEMIDE 40 MG PO TABS
40.0000 mg | ORAL_TABLET | Freq: Every day | ORAL | Status: DC
  Administered 2016-07-12 – 2016-07-16 (×5): 40 mg via ORAL
  Filled 2016-07-11 (×5): qty 1

## 2016-07-11 MED ORDER — INSULIN ASPART 100 UNIT/ML ~~LOC~~ SOLN
0.0000 [IU] | Freq: Every day | SUBCUTANEOUS | Status: DC
Start: 2016-07-11 — End: 2016-07-16
  Administered 2016-07-11: 2 [IU] via SUBCUTANEOUS
  Administered 2016-07-12: 4 [IU] via SUBCUTANEOUS
  Administered 2016-07-13: 5 [IU] via SUBCUTANEOUS
  Administered 2016-07-14: 3 [IU] via SUBCUTANEOUS
  Administered 2016-07-15: 5 [IU] via SUBCUTANEOUS

## 2016-07-11 MED ORDER — SODIUM CHLORIDE 0.9 % IV SOLN: 250.0000 mL | INTRAVENOUS | Status: DC | PRN

## 2016-07-11 MED ORDER — MIDAZOLAM HCL 2 MG/2ML IJ SOLN
INTRAMUSCULAR | Status: DC | PRN
  Administered 2016-07-11: 1 mg via INTRAVENOUS

## 2016-07-11 MED ORDER — LIDOCAINE HCL (PF) 1 % IJ SOLN
INTRAMUSCULAR | Status: DC | PRN
  Administered 2016-07-11: 1 mL via INTRADERMAL

## 2016-07-11 MED ORDER — VERAPAMIL HCL 2.5 MG/ML IV SOLN
INTRAVENOUS | Status: DC | PRN
  Administered 2016-07-11: 10 mL via INTRA_ARTERIAL

## 2016-07-11 MED ORDER — IOPAMIDOL (ISOVUE-370) INJECTION 76%
INTRAVENOUS | Status: AC
  Filled 2016-07-11: qty 50

## 2016-07-11 MED ORDER — INSULIN ASPART 100 UNIT/ML ~~LOC~~ SOLN
0.0000 [IU] | Freq: Three times a day (TID) | SUBCUTANEOUS | Status: DC
  Administered 2016-07-12: 5 [IU] via SUBCUTANEOUS
  Administered 2016-07-12: 20 [IU] via SUBCUTANEOUS
  Administered 2016-07-12: 15 [IU] via SUBCUTANEOUS
  Administered 2016-07-13: 4 [IU] via SUBCUTANEOUS
  Administered 2016-07-13: 11 [IU] via SUBCUTANEOUS
  Administered 2016-07-14: 7 [IU] via SUBCUTANEOUS
  Administered 2016-07-14: 15 [IU] via SUBCUTANEOUS
  Administered 2016-07-14 – 2016-07-15 (×3): 4 [IU] via SUBCUTANEOUS
  Administered 2016-07-15: 11 [IU] via SUBCUTANEOUS
  Administered 2016-07-16: 7 [IU] via SUBCUTANEOUS
  Administered 2016-07-16: 11 [IU] via SUBCUTANEOUS
  Administered 2016-07-16: 4 [IU] via SUBCUTANEOUS

## 2016-07-11 MED ORDER — FENTANYL CITRATE (PF) 100 MCG/2ML IJ SOLN
INTRAMUSCULAR | Status: AC
  Filled 2016-07-11: qty 2

## 2016-07-11 MED ORDER — HEPARIN (PORCINE) IN NACL 2-0.9 UNIT/ML-% IJ SOLN
INTRAMUSCULAR | Status: DC | PRN
  Administered 2016-07-11: 1000 mL

## 2016-07-11 MED ORDER — HEPARIN SODIUM (PORCINE) 1000 UNIT/ML IJ SOLN
INTRAMUSCULAR | Status: DC | PRN
  Administered 2016-07-11: 5000 [IU] via INTRAVENOUS

## 2016-07-11 SURGICAL SUPPLY — 17 items
CATH 5FR JL3.5 JR4 ANG PIG MP (CATHETERS) ×2 IMPLANT
CATH INFINITI 5 FR 3DRC (CATHETERS) ×2 IMPLANT
CATH LAUNCHER 5F EBU3.0 (CATHETERS) ×1 IMPLANT
CATH OPTITORQUE TIG 4.0 5F (CATHETERS) ×2 IMPLANT
CATH SITESEER 5F NTR (CATHETERS) ×2 IMPLANT
CATHETER LAUNCHER 5F EBU3.0 (CATHETERS) ×2
DEVICE RAD COMP TR BAND LRG (VASCULAR PRODUCTS) ×2 IMPLANT
GLIDESHEATH SLEND SS 6F .021 (SHEATH) ×2 IMPLANT
GUIDEWIRE INQWIRE 1.5J.035X260 (WIRE) ×1 IMPLANT
HOVERMATT SINGLE USE (MISCELLANEOUS) ×2 IMPLANT
INQWIRE 1.5J .035X260CM (WIRE) ×2
KIT ESSENTIALS PG (KITS) ×2 IMPLANT
KIT HEART LEFT (KITS) ×2 IMPLANT
PACK CARDIAC CATHETERIZATION (CUSTOM PROCEDURE TRAY) ×2 IMPLANT
TRANSDUCER W/STOPCOCK (MISCELLANEOUS) ×2 IMPLANT
TUBING CIL FLEX 10 FLL-RA (TUBING) ×2 IMPLANT
WIRE HI TORQ VERSACORE-J 145CM (WIRE) ×2 IMPLANT

## 2016-07-11 NOTE — Progress Notes (Signed)
PROGRESS NOTE    Jamie Mason  ONG:[id number] DOB: 09-06-1940 DOA: 07/06/2016 PCP: Blanch Media, MD   Chief Complaint  Patient presents with  . Respiratory Distress    Brief Narrative:  76 yo F with PMH of dCHF, morbid obesity,admitted 1/24 with SOB. 1/24 she was brought in via EMS from her nursing home for respiratory distress. She was found to have sats in the 80's, fever to 102.5 and was placed on CPAP with improvement.  Influenza panel was positive.  She required BIPAP support with improvement.  Pt was transferred out of ICU 1/25.   Assessment & Plan   Acute hypoxic respiratory distress -Secondary to influenza, CHF exacerbation -Patient required BiPAP support upon admission, currently on nasal cannula -Continue supplemental oxygen to maintain saturations above 92% -Continue steroids, nebs, Tamiflu  Sepsis secondary to Influenza A -Patient presented with tachycardia, tachypnea, leukocytosis with an elevated lactic acid of 4.49 -Treatment plan as above  NSTEMI/CAD -Cardiology consulted and appreciated -patient has known occlusion of RCA -Continue Coreg, statin, aspirin -Heparin held due to hematuria- will restart heparin today and monitor closely -Plan for heart catheterization today 07/11/2016  Acute on chronic diastolic heart failure -Appears to be improving -Echocardiogram 06/27/2016 showed an EF of 60-65%, grade 2 diastolic dysfunction -Patient currently appears to be euvolemic -Continue Lasix -Monitor intake and output, daily weights  AKI on CKD, stage III -Creatinine peaked to 1.95, currently trending downward -Creatinine 1.13 today -Continue to monitor BMP  Paroxsymal Atrial fibrillation -Continue coreg, aspirin  -Heparin held due to hematuria- restarted today -Patient HR was in the 130s yesterday. Coreg increased but ineffective. Placed on cardizem drip -Continue cardizem  Hematuria -Possibly secondary to heparin drip -hemoglobin 9.2  ?UTI -Urine  culture >100K GPC- VRE -Repeat UA/culture pending not convincing of infection and patient denies any dysuria-  Repeat UA unremarkable/Urine culture shows no growth -Will likely discontinue macrobid   Normocytic anemia -Hemoglobin currently 9.1 -Continue to monitor CBC  DVT Prophylaxis  SCDs, Heparin  Code Status: Full  Family Communication: None at bedside  Disposition Plan: Admitted. Pending cath to 1/29. SNF when medically stable  Consultants Cardiology PCCM  Procedures  None  Antibiotics   Anti-infectives    Start     Dose/Rate Route Frequency Ordered Stop   07/08/16 0400  vancomycin (VANCOCIN) 1,750 mg in sodium chloride 0.9 % 500 mL IVPB  Status:  Discontinued     1,750 mg 250 mL/hr over 120 Minutes Intravenous Every 48 hours 07/06/16 0409 07/06/16 1011   07/08/16 0000  levofloxacin (LEVAQUIN) IVPB 750 mg  Status:  Discontinued     750 mg 100 mL/hr over 90 Minutes Intravenous Every 48 hours 07/06/16 0409 07/06/16 0413   07/06/16 1400  aztreonam (AZACTAM) 1 g in dextrose 5 % 50 mL IVPB  Status:  Discontinued     1 g 100 mL/hr over 30 Minutes Intravenous Every 8 hours 07/06/16 0409 07/07/16 0848   07/06/16 1000  oseltamivir (TAMIFLU) capsule 30 mg  Status:  Discontinued     30 mg Oral Daily 07/06/16 0347 07/06/16 0424   07/06/16 1000  oseltamivir (TAMIFLU) capsule 30 mg     30 mg Oral 2 times daily 07/06/16 0424 07/10/16 2210   07/06/16 0230  vancomycin (VANCOCIN) 1,500 mg in sodium chloride 0.9 % 500 mL IVPB     1,500 mg 250 mL/hr over 120 Minutes Intravenous  Once 07/06/16 0202 07/06/16 0834   07/06/16 0200  levofloxacin (LEVAQUIN) IVPB 750 mg  750 mg 100 mL/hr over 90 Minutes Intravenous  Once 07/06/16 0155 07/06/16 0444   07/06/16 0200  aztreonam (AZACTAM) 2 g in dextrose 5 % 50 mL IVPB     2 g 100 mL/hr over 30 Minutes Intravenous  Once 07/06/16 0155 07/06/16 0512   07/06/16 0200  vancomycin (VANCOCIN) IVPB 1000 mg/200 mL premix  Status:  Discontinued      1,000 mg 200 mL/hr over 60 Minutes Intravenous  Once 07/06/16 0155 07/06/16 0200      Subjective:   Jamie Mason seen and examined today.  Patient states she is feeling better.  Feels breathing has improved. Denies chest pain, abdominal pain, nausea or vomiting, dizziness or headache.   Objective:   Vitals:   07/10/16 2006 07/11/16 0516 07/11/16 0823 07/11/16 1233  BP: (!) 143/57 (!) 153/92 (!) 149/64 (!) 148/69  Pulse: 90 90 69 89  Resp: 19 18 18 18   Temp: 98.2 F (36.8 C) 97.3 F (36.3 C) 98.5 F (36.9 C) 97.7 F (36.5 C)  TempSrc: Oral Oral Oral Oral  SpO2: 100% 100% 96% 100%  Weight:  (!) 136.5 kg (300 lb 14.4 oz)    Height:        Intake/Output Summary (Last 24 hours) at 07/11/16 1256 Last data filed at 07/11/16 1023  Gross per 24 hour  Intake             1153 ml  Output              500 ml  Net              653 ml   Filed Weights   07/09/16 0433 07/10/16 0300 07/11/16 0516  Weight: 135.9 kg (299 lb 9.6 oz) 135.6 kg (299 lb) (!) 136.5 kg (300 lb 14.4 oz)    Exam  General: Well developed, well nourished, NAD, appears stated age  HEENT: NCAT,mucous membranes moist.   Cardiovascular: S1 S2 auscultated, no rubs, murmurs or gallops. irregular  Respiratory: Diminished, coarse breath sounds  Abdomen: Soft, obese, nontender, nondistended, + bowel sounds  Extremities: warm dry without cyanosis clubbing or edema  Neuro: AAOx3, nonfocal  Psych: Normal affect and demeanor, pleasant   Data Reviewed: I have personally reviewed following labs and imaging studies  CBC:  Recent Labs Lab 07/06/16 0106  07/07/16 0921 07/08/16 0424 07/09/16 0419 07/10/16 0502 07/11/16 0254  WBC 12.5*  < > 24.2* 20.6* 18.4* 17.3* 19.8*  NEUTROABS 6.5  --  20.9*  --   --   --   --   HGB 9.6*  < > 9.3* 9.0* 9.6* 9.2* 9.1*  HCT 29.8*  < > 28.9* 28.1* 30.4* 29.4* 28.8*  MCV 88.2  < > 87.8 87.8 88.4 87.8 88.1  PLT 367  < > 399 433* 461* 428* 432*  < > = values in this interval  not displayed. Basic Metabolic Panel:  Recent Labs Lab 07/07/16 0921 07/08/16 0424 07/09/16 0419 07/10/16 0502 07/11/16 0254  NA 140 139 138 138 138  K 3.9 3.7 5.0 4.3 4.4  CL 101 100* 97* 101 102  CO2 28 30 30 29 29   GLUCOSE 195* 185* 204* 250* 272*  BUN 29* 34* 40* 33* 30*  CREATININE 1.57* 1.44* 1.28* 1.06* 1.13*  CALCIUM 8.4* 8.3* 8.6* 8.6* 8.8*   GFR: Estimated Creatinine Clearance: 62.2 mL/min (by C-G formula based on SCr of 1.13 mg/dL (H)). Liver Function Tests:  Recent Labs Lab 07/06/16 0135 07/07/16 0921 07/08/16 0424  AST 76* 99*  60*  ALT 30 45 39  ALKPHOS 58 52 53  BILITOT 0.5 0.6 0.8  PROT 7.8 7.5 7.4  ALBUMIN 2.7* 2.5* 2.3*   No results for input(s): LIPASE, AMYLASE in the last 168 hours. No results for input(s): AMMONIA in the last 168 hours. Coagulation Profile:  Recent Labs Lab 07/11/16 0929  INR 1.14   Cardiac Enzymes:  Recent Labs Lab 07/06/16 0106 07/06/16 0356 07/06/16 1427  TROPONINI 3.18* 2.23* 1.93*   BNP (last 3 results) No results for input(s): PROBNP in the last 8760 hours. HbA1C: No results for input(s): HGBA1C in the last 72 hours. CBG:  Recent Labs Lab 07/10/16 1700 07/10/16 2218 07/11/16 0102 07/11/16 0638 07/11/16 1230  GLUCAP 315* 427* 309* 256* 229*   Lipid Profile: No results for input(s): CHOL, HDL, LDLCALC, TRIG, CHOLHDL, LDLDIRECT in the last 72 hours. Thyroid Function Tests: No results for input(s): TSH, T4TOTAL, FREET4, T3FREE, THYROIDAB in the last 72 hours. Anemia Panel: No results for input(s): VITAMINB12, FOLATE, FERRITIN, TIBC, IRON, RETICCTPCT in the last 72 hours. Urine analysis:    Component Value Date/Time   COLORURINE YELLOW 07/10/2016 1021   APPEARANCEUR CLEAR 07/10/2016 1021   LABSPEC 1.018 07/10/2016 1021   PHURINE 6.0 07/10/2016 1021   GLUCOSEU 50 (A) 07/10/2016 1021   HGBUR SMALL (A) 07/10/2016 1021   BILIRUBINUR NEGATIVE 07/10/2016 1021   BILIRUBINUR Negative 03/19/2014 1631    KETONESUR NEGATIVE 07/10/2016 1021   PROTEINUR NEGATIVE 07/10/2016 1021   UROBILINOGEN 0.2 03/19/2014 1631   NITRITE NEGATIVE 07/10/2016 1021   LEUKOCYTESUR SMALL (A) 07/10/2016 1021   Sepsis Labs: @LABRCNTIP (procalcitonin:4,lacticidven:4)  ) Recent Results (from the past 240 hour(s))  Respiratory Panel by PCR     Status: Abnormal   Collection Time: 07/06/16  1:00 AM  Result Value Ref Range Status   Adenovirus NOT DETECTED NOT DETECTED Final   Coronavirus 229E NOT DETECTED NOT DETECTED Final   Coronavirus HKU1 NOT DETECTED NOT DETECTED Final   Coronavirus NL63 NOT DETECTED NOT DETECTED Final   Coronavirus OC43 NOT DETECTED NOT DETECTED Final   Metapneumovirus NOT DETECTED NOT DETECTED Final   Rhinovirus / Enterovirus NOT DETECTED NOT DETECTED Final   Influenza A H3 DETECTED (A) NOT DETECTED Final   Influenza B NOT DETECTED NOT DETECTED Final   Parainfluenza Virus 1 NOT DETECTED NOT DETECTED Final   Parainfluenza Virus 2 NOT DETECTED NOT DETECTED Final   Parainfluenza Virus 3 NOT DETECTED NOT DETECTED Final   Parainfluenza Virus 4 NOT DETECTED NOT DETECTED Final   Respiratory Syncytial Virus NOT DETECTED NOT DETECTED Final   Bordetella pertussis NOT DETECTED NOT DETECTED Final   Chlamydophila pneumoniae NOT DETECTED NOT DETECTED Final   Mycoplasma pneumoniae NOT DETECTED NOT DETECTED Final  Blood Culture (routine x 2)     Status: None (Preliminary result)   Collection Time: 07/06/16  1:05 AM  Result Value Ref Range Status   Specimen Description BLOOD LEFT ARM  Final   Special Requests BOTTLES DRAWN AEROBIC AND ANAEROBIC 5ML  Final   Culture NO GROWTH 4 DAYS  Final   Report Status PENDING  Incomplete  Blood Culture (routine x 2)     Status: None (Preliminary result)   Collection Time: 07/06/16  1:35 AM  Result Value Ref Range Status   Specimen Description BLOOD RIGHT HAND  Final   Special Requests BOTTLES DRAWN AEROBIC AND ANAEROBIC 5ML  Final   Culture NO GROWTH 4 DAYS   Final   Report Status PENDING  Incomplete  MRSA PCR Screening     Status: None   Collection Time: 07/06/16  6:47 AM  Result Value Ref Range Status   MRSA by PCR NEGATIVE NEGATIVE Final    Comment:        The GeneXpert MRSA Assay (FDA approved for NASAL specimens only), is one component of a comprehensive MRSA colonization surveillance program. It is not intended to diagnose MRSA infection nor to guide or monitor treatment for MRSA infections.   Urine culture     Status: Abnormal   Collection Time: 07/06/16 10:40 AM  Result Value Ref Range Status   Specimen Description URINE, RANDOM  Final   Special Requests NONE  Final   Culture (A)  Final    >=100,000 COLONIES/mL VANCOMYCIN RESISTANT ENTEROCOCCUS   Report Status 07/10/2016 FINAL  Final   Organism ID, Bacteria VANCOMYCIN RESISTANT ENTEROCOCCUS (A)  Final      Susceptibility   Vancomycin resistant enterococcus - MIC*    AMPICILLIN <=2 SENSITIVE Sensitive     LEVOFLOXACIN >=8 RESISTANT Resistant     NITROFURANTOIN <=16 SENSITIVE Sensitive     VANCOMYCIN >=32 RESISTANT Resistant     LINEZOLID 2 SENSITIVE Sensitive     * >=100,000 COLONIES/mL VANCOMYCIN RESISTANT ENTEROCOCCUS  Culture, Urine     Status: None   Collection Time: 07/10/16 10:21 AM  Result Value Ref Range Status   Specimen Description URINE, RANDOM  Final   Special Requests NONE  Final   Culture NO GROWTH  Final   Report Status 07/11/2016 FINAL  Final      Radiology Studies: No results found.   Scheduled Meds: . aspirin EC  81 mg Oral Daily  . atorvastatin  40 mg Oral q1800  . carvedilol  12.5 mg Oral BID WC  . chlorhexidine  15 mL Mouth Rinse BID  . furosemide  20 mg Oral Daily  . insulin aspart  0-15 Units Subcutaneous TID WC & HS  . mouth rinse  15 mL Mouth Rinse q12n4p  . methylPREDNISolone (SOLU-MEDROL) injection  40 mg Intravenous Q12H  . nitrofurantoin (macrocrystal-monohydrate)  100 mg Oral Q12H  . senna-docusate  1 tablet Oral BID  . sodium  chloride flush  3 mL Intravenous Q12H   Continuous Infusions: . sodium chloride 1 mL/kg/hr (07/11/16 1125)  . dilTIAZem HCl-Dextrose 15 mg/hr (07/11/16 1021)  . heparin 1,100 Units/hr (07/11/16 0802)     LOS: 5 days   Time Spent in minutes   30 minutes  Jamie Mason D.O. on 07/11/2016 at 12:56 PM  Between 7am to 7pm - Pager - [phone]  After 7pm go to [email or web address] - password [email or web address]  And look for the night coverage person covering for me after hours  [facility] Office  [phone]

## 2016-07-11 NOTE — Progress Notes (Signed)
Inpatient Diabetes Program Recommendations  AACE/ADA: New Consensus Statement on Inpatient Glycemic Control (2015)  Target Ranges:  Prepandial:   less than 140 mg/dL      Peak postprandial:   less than 180 mg/dL (1-2 hours)      Critically ill patients:  140 - 180 mg/dL   Lab Results  Component Value Date   GLUCAP 256 (H) 07/11/2016   HGBA1C 7.3 (H) 06/27/2016    Review of Glycemic Control Results for Jamie Mason, [name] (MRN [id number]) as of 07/11/2016 11:16  Ref. Range 07/10/2016 11:38 07/10/2016 17:00 07/10/2016 22:18 07/11/2016 01:02 07/11/2016 06:38  Glucose-Capillary Latest Ref Range: 65 - 99 mg/dL 409256 (H) 811315 (H) 914427 (H) 309 (H) 256 (H)   Inpatient Diabetes Program Recommendations:  While on steroids and oral meds held, please consider adding basal of Lantus 27 units daily. Will follow.  Thank you, Billy FischerJudy E. Tamme Mozingo, RN, MSN, CDE Inpatient Glycemic Control Team Team Pager [phone] (8am-5pm) 07/11/2016 11:24 AM

## 2016-07-11 NOTE — Progress Notes (Signed)
ANTICOAGULATION CONSULT NOTE - Follow Up Consult  Pharmacy Consult for heparin Indication: NSTEMI  Patient Measurements: Height: 5\' 5"  (165.1 cm) Weight: (!) 300 lb 14.4 oz (136.5 kg) (Scale B) IBW/kg (Calculated) : 57 Heparin Dosing Weight: 90.9 kg  Vital Signs: Temp: 97.7 F (36.5 C) (01/29 1233) Temp Source: Oral (01/29 1233) BP: 148/69 (01/29 1233) Pulse Rate: 89 (01/29 1233)  Labs:  Recent Labs  07/09/16 0419 07/10/16 0502 07/10/16 1807 07/11/16 0254 07/11/16 0929 07/11/16 1339  HGB 9.6* 9.2*  --  9.1*  --   --   HCT 30.4* 29.4*  --  28.8*  --   --   PLT 461* 428*  --  432*  --   --   LABPROT  --   --   --   --  14.6  --   INR  --   --   --   --  1.14  --   HEPARINUNFRC  --   --  0.32 0.29*  --  0.51  CREATININE 1.28* 1.06*  --  1.13*  --   --     Estimated Creatinine Clearance: 62.2 mL/min (by C-G formula based on SCr of 1.13 mg/dL (H)).   Assessment: Jamie Mason admitted 1/24 for sepsis and NSTEMI, started on heparin drip.  Previously with hematuria requiring infusion to be held, resumed on 1/28.  Heparin level is therapeutic at 0.51 on 1100 units/hr. Noted, known hematuria, Hgb stable in 9's, platelets are normal.  Goal of Therapy:  Heparin level 0.3-0.7 units/ml Monitor platelets by anticoagulation protocol: Yes   Plan:  -Continue heparin drip at 1100 units/hr -Monitor hematuria, CBC, heparin level daily -F/U after cath  Loura BackJennifer Brushton, PharmD, BCPS Clinical Pharmacist Phone for today [phone] Main pharmacy - [phone] 07/11/2016 3:12 PM

## 2016-07-11 NOTE — Progress Notes (Signed)
Dressing change completed per order. Patient tolerated well. No complaints of pain. Consent obtained for cath today. Patient remained NPO overnight. Patient in bed resting.

## 2016-07-11 NOTE — Clinical Social Work Note (Signed)
CSW acknowledges SNF consult. Patient in need of PT/OT evaluations before insurance authorization can be started and patient can return.  Jamie CourtSarah Myleen Brailsford, CSW [phone]

## 2016-07-11 NOTE — Progress Notes (Signed)
Patient Name: Jamie Mason Date of Encounter: 07/11/2016  Primary Cardiologist: Dr [facility] Problem List     Principal Problem:   Acute on chronic respiratory failure [facility]) Active Problems:   Elevated troponin   Hypoxia   Pressure injury of skin   Flu   Atrial flutter with rapid ventricular response (HCC)   Subjective   Pt very hungry, no palpitations, no chest pain, no SOB. Prefers Dr Eden Emms as her cardiologist.  Inpatient Medications    Scheduled Meds: . aspirin EC  81 mg Oral Daily  . atorvastatin  40 mg Oral q1800  . carvedilol  12.5 mg Oral BID WC  . chlorhexidine  15 mL Mouth Rinse BID  . furosemide  20 mg Oral Daily  . insulin aspart  0-15 Units Subcutaneous TID WC & HS  . mouth rinse  15 mL Mouth Rinse q12n4p  . methylPREDNISolone (SOLU-MEDROL) injection  40 mg Intravenous Q12H  . nitrofurantoin (macrocrystal-monohydrate)  100 mg Oral Q12H  . senna-docusate  1 tablet Oral BID  . sodium chloride flush  3 mL Intravenous Q12H   Continuous Infusions: . sodium chloride 1 mL/kg/hr (07/11/16 1125)  . dilTIAZem HCl-Dextrose 15 mg/hr (07/11/16 1021)  . heparin 1,100 Units/hr (07/11/16 0802)   PRN Meds: sodium chloride, sodium chloride, acetaminophen, albuterol, ondansetron (ZOFRAN) IV, sodium chloride flush   Vital Signs    Vitals:   07/10/16 2006 07/11/16 0516 07/11/16 0823 07/11/16 1233  BP: (!) 143/57 (!) 153/92 (!) 149/64 (!) 148/69  Pulse: 90 90 69 89  Resp: 19 18 18 18   Temp: 98.2 F (36.8 C) 97.3 F (36.3 C) 98.5 F (36.9 C) 97.7 F (36.5 C)  TempSrc: Oral Oral Oral Oral  SpO2: 100% 100% 96% 100%  Weight:  (!) 300 lb 14.4 oz (136.5 kg)    Height:        Intake/Output Summary (Last 24 hours) at 07/11/16 1326 Last data filed at 07/11/16 1023  Gross per 24 hour  Intake             1153 ml  Output              500 ml  Net              653 ml   Filed Weights   07/09/16 0433 07/10/16 0300 07/11/16 0516  Weight: 299 lb 9.6 oz (135.9  kg) 299 lb (135.6 kg) (!) 300 lb 14.4 oz (136.5 kg)    Physical Exam    GEN: Well nourished, well developed, in no acute distress.  HEENT: Grossly normal.  Neck: Supple, no JVD, carotid bruits, or masses. Cardiac: Irreg R&R, soft murmur, no rubs, or gallops. No clubbing, cyanosis, edema.  Radials/DP/PT 2+ and equal bilaterally.  Respiratory:  Respirations regular and unlabored, decreased BS bases w/ few rales bilaterally. GI: Soft, nontender, nondistended, BS + x 4. MS: no deformity or atrophy. Skin: warm and dry, no rash. Neuro:  Strength and sensation are intact. Psych: AAOx3.  Normal affect.  Labs    CBC  Recent Labs  07/10/16 0502 07/11/16 0254  WBC 17.3* 19.8*  HGB 9.2* 9.1*  HCT 29.4* 28.8*  MCV 87.8 88.1  PLT 428* 432*   Basic Metabolic Panel  Recent Labs  07/10/16 0502 07/11/16 0254  NA 138 138  K 4.3 4.4  CL 101 102  CO2 29 29  GLUCOSE 250* 272*  BUN 33* 30*  CREATININE 1.06* 1.13*  CALCIUM 8.6* 8.8*   Liver  Function Tests Lab Results  Component Value Date   ALT 39 07/08/2016   AST 60 (H) 07/08/2016   ALKPHOS 53 07/08/2016   BILITOT 0.8 07/08/2016   Cardiac Enzymes Lab Results  Component Value Date   TROPONINI 1.93 (HH) 07/06/2016   BNP   Recent Labs  06/27/16 1717 07/06/16 0106  BNP 1,283.8* 731.2*   Hemoglobin A1C Lab Results  Component Value Date   HGBA1C 7.3 (H) 06/27/2016   Fasting Lipid Panel Lab Results  Component Value Date   CHOL 170 03/19/2014   HDL 26 (L) 03/19/2014   LDLCALC 128 (H) 03/19/2014   TRIG 79 03/19/2014   CHOLHDL 6.5 03/19/2014   Thyroid Function Tests Lab Results  Component Value Date   TSH 1.615 03/19/2014     Telemetry    Atrial fib, RVR at times - Personally Reviewed  ECG    n/a - Personally Reviewed  Radiology    No results found.  Cardiac Studies   ECHO: 01/15 - Left ventricle: LVEF is normal at 60 to 65% with distal inferior   hypokinesis. The cavity size was normal. Wall  thickness was   increased in a pattern of moderate LVH. Systolic function was   normal. The estimated ejection fraction was in the range of 60%   to 65%. Features are consistent with a pseudonormal left   ventricular filling pattern, with concomitant abnormal relaxation   and increased filling pressure (grade 2 diastolic dysfunction).   Doppler parameters are consistent with high ventricular filling pressure. - Mitral valve: There was mild regurgitation. - Pulmonary arteries: PA peak pressure: 47 mm Hg (S). - Left atrium:  The atrium was normal in size. - Right atrium:  The atrium was normal in size.  Patient Profile     76 y.o. female with a history of RCA 100%, Hypertensive heart disease, hyperlipidemia, diabetes, lumbar disc problem, morbid obesity, former smoker. Admit 01/14-01/18 with LE wounds. Admit 01/25 w/ SOB, atrial fib RVR.   Assessment & Plan    Principal Problem:   Acute on chronic respiratory failure (HCC) - resp status improved, ok for cath - per IM  Active Problems:   Elevated troponin - NSTEMI by ez, hx RCA 100% - for cath today to eval anatomy - continue ASA, BB, high dost statin, heparin    Hypoxia - per IM, see above    Pressure injury of skin - per IM    Flu - PCR positive - per IM    Atrial flutter with rapid ventricular response (HCC) - HR improved but not controlled on IV Cardizem at 15 mg/hr and Coreg 12.5 mg bid - PTA was on verapamil 240 mg qd. - both atrial were normal in size on recent echo - feel she would benefit from SR but with acute illness on steroids and nebs, this will be difficult - MD to review and advise if anti-arrhythmic needed.  Signed, Leanna BattlesBarrett, Rhonda, PA-C  07/11/2016, 1:26 PM    The patient was seen, examined and discussed with Theodore Demarkhonda Barrett, PA-C and I agree with the above.   76 year old female admitted with acuet respiratory failure with hypoxia in the settings of comfirmed influenza, also  NSTEMI - awaiting cath  today, Crea 1.1. Also developed a-flutter with RVR - started here , came in ST, still with RVR on cardizem drip 15 mg/hr. We will arrange for a DCCV tomorrow if she doesn't cardiovert spontaneously.  Tobias AlexanderKatarina Jaliya Siegmann, MD 07/11/2016

## 2016-07-11 NOTE — Progress Notes (Signed)
Patient's TR band off. No bleeding noted. Site at level 0. Patient's vital signs stable. Patient currently in bed resting. Will continue to monitor

## 2016-07-11 NOTE — Progress Notes (Signed)
ANTICOAGULATION CONSULT NOTE - Follow Up Consult  Pharmacy Consult for heparin Indication: NSTEMI  Patient Measurements: Height: 5\' 5"  (165.1 cm) Weight: 299 lb (135.6 kg) IBW/kg (Calculated) : 57 Heparin Dosing Weight: 90.9 kg  Vital Signs: Temp: 98.2 F (36.8 C) (01/28 2006) Temp Source: Oral (01/28 2006) BP: 143/57 (01/28 2006) Pulse Rate: 90 (01/28 2006)  Labs:  Recent Labs  07/09/16 0419 07/10/16 0502 07/10/16 1807 07/11/16 0254  HGB 9.6* 9.2*  --  9.1*  HCT 30.4* 29.4*  --  28.8*  PLT 461* 428*  --  432*  HEPARINUNFRC  --   --  0.32 0.29*  CREATININE 1.28* 1.06*  --   --     Estimated Creatinine Clearance: 66 mL/min (by C-G formula based on SCr of 1.06 mg/dL (H)).   Assessment: Jamie Mason admitted 1/24 for sepsis and NSTEMI, started on heparin drip.  Previously with hematuria requiring infusion to be held, resumed on 1/28.  Heparin level this morning dropped to 0.29 units/mL. Per RN Alcario DroughtErica, scant hematuria but not worsening.   Goal of Therapy:  Heparin level 0.3-0.7 units/ml Monitor platelets by anticoagulation protocol: Yes   Plan:  -Increase Heparin to 1100 units/hr -Heparin level in 8 horus -Monitor hematuria, CBC, heparin level daily -F/U plans for cath  Lota Leamer D. Kela Baccari, PharmD, BCPS Clinical Pharmacist Pager: [phone] 07/11/2016 4:36 AM

## 2016-07-11 NOTE — Interval H&P Note (Signed)
History and Physical Interval Note:  07/11/2016 5:26 PM  Jamie Mason  has presented today for cardiac catheterization, with the diagnosis of nstemi. The various methods of treatment have been discussed with the patient and family. After consideration of risks, benefits and other options for treatment, the patient has consented to  Procedure(s): Left Heart Cath and Coronary Angiography (N/A) as a surgical intervention .  The patient's history has been reviewed, patient examined, no change in status, stable for surgery.  I have reviewed the patient's chart and labs.  Questions were answered to the patient's satisfaction.    Cath Lab Visit (complete for each Cath Lab visit)  Clinical Evaluation Leading to the Procedure:   ACS: Yes.    Non-ACS:  N/A  [name]

## 2016-07-11 NOTE — Progress Notes (Signed)
ANTICOAGULATION CONSULT NOTE - Follow Up Consult  Pharmacy Consult for heparin Indication: NSTEMI  Patient Measurements: Height: 5\' 5"  (165.1 cm) Weight: (!) 300 lb 14.4 oz (136.5 kg) (Scale B) IBW/kg (Calculated) : 57 Heparin Dosing Weight: 90.9 kg  Vital Signs: Temp: 97.5 F (36.4 C) (01/29 1905) Temp Source: Oral (01/29 1905) BP: 153/70 (01/29 1905) Pulse Rate: 70 (01/29 1905)  Labs:  Recent Labs  07/09/16 0419 07/10/16 0502 07/10/16 1807 07/11/16 0254 07/11/16 0929 07/11/16 1339  HGB 9.6* 9.2*  --  9.1*  --   --   HCT 30.4* 29.4*  --  28.8*  --   --   PLT 461* 428*  --  432*  --   --   LABPROT  --   --   --   --  14.6  --   INR  --   --   --   --  1.14  --   HEPARINUNFRC  --   --  0.32 0.29*  --  0.51  CREATININE 1.28* 1.06*  --  1.13*  --   --     Estimated Creatinine Clearance: 62.2 mL/min (by C-G formula based on SCr of 1.13 mg/dL (H)).   Assessment: [age] yoF admitted 1/24 for sepsis and NSTEMI, started on heparin drip.  Previously with hematuria requiring infusion to be held, resumed on 1/28.  Heparin level is therapeutic at 0.51 on 1100 units/hr. Noted, known hematuria, Hgb stable in 9's, platelets are normal.  Now pt is s/p cath and heparin to be resumed 4 hrs s/p TR band removal.  Per RN her TR band should be off by 2200 so will start heparin at 0200 am.   Goal of Therapy:  Heparin level 0.3-0.7 units/ml Monitor platelets by anticoagulation protocol: Yes   Plan:  -resume heparin drip at 1100 units/hr 4 hrs s/p TR band at 02 am 1/30 and check 8 hr HL -Monitor hematuria, CBC, heparin level daily  Herby AbrahamMichelle T. Daquana Paddock, Pharm.D. [phone] 07/11/2016 7:33 PM

## 2016-07-11 NOTE — H&P (View-Only) (Signed)
Patient Name: Jamie Mason Date of Encounter: 07/11/2016  Primary Cardiologist: Dr [facility] Problem List     Principal Problem:   Acute on chronic respiratory failure [facility]) Active Problems:   Elevated troponin   Hypoxia   Pressure injury of skin   Flu   Atrial flutter with rapid ventricular response (HCC)   Subjective   Pt very hungry, no palpitations, no chest pain, no SOB. Prefers Dr Eden Emms as her cardiologist.  Inpatient Medications    Scheduled Meds: . aspirin EC  81 mg Oral Daily  . atorvastatin  40 mg Oral q1800  . carvedilol  12.5 mg Oral BID WC  . chlorhexidine  15 mL Mouth Rinse BID  . furosemide  20 mg Oral Daily  . insulin aspart  0-15 Units Subcutaneous TID WC & HS  . mouth rinse  15 mL Mouth Rinse q12n4p  . methylPREDNISolone (SOLU-MEDROL) injection  40 mg Intravenous Q12H  . nitrofurantoin (macrocrystal-monohydrate)  100 mg Oral Q12H  . senna-docusate  1 tablet Oral BID  . sodium chloride flush  3 mL Intravenous Q12H   Continuous Infusions: . sodium chloride 1 mL/kg/hr (07/11/16 1125)  . dilTIAZem HCl-Dextrose 15 mg/hr (07/11/16 1021)  . heparin 1,100 Units/hr (07/11/16 0802)   PRN Meds: sodium chloride, sodium chloride, acetaminophen, albuterol, ondansetron (ZOFRAN) IV, sodium chloride flush   Vital Signs    Vitals:   07/10/16 2006 07/11/16 0516 07/11/16 0823 07/11/16 1233  BP: (!) 143/57 (!) 153/92 (!) 149/64 (!) 148/69  Pulse: 90 90 69 89  Resp: 19 18 18 18   Temp: 98.2 F (36.8 C) 97.3 F (36.3 C) 98.5 F (36.9 C) 97.7 F (36.5 C)  TempSrc: Oral Oral Oral Oral  SpO2: 100% 100% 96% 100%  Weight:  (!) 300 lb 14.4 oz (136.5 kg)    Height:        Intake/Output Summary (Last 24 hours) at 07/11/16 1326 Last data filed at 07/11/16 1023  Gross per 24 hour  Intake             1153 ml  Output              500 ml  Net              653 ml   Filed Weights   07/09/16 0433 07/10/16 0300 07/11/16 0516  Weight: 299 lb 9.6 oz (135.9  kg) 299 lb (135.6 kg) (!) 300 lb 14.4 oz (136.5 kg)    Physical Exam    GEN: Well nourished, well developed, in no acute distress.  HEENT: Grossly normal.  Neck: Supple, no JVD, carotid bruits, or masses. Cardiac: Irreg R&R, soft murmur, no rubs, or gallops. No clubbing, cyanosis, edema.  Radials/DP/PT 2+ and equal bilaterally.  Respiratory:  Respirations regular and unlabored, decreased BS bases w/ few rales bilaterally. GI: Soft, nontender, nondistended, BS + x 4. MS: no deformity or atrophy. Skin: warm and dry, no rash. Neuro:  Strength and sensation are intact. Psych: AAOx3.  Normal affect.  Labs    CBC  Recent Labs  07/10/16 0502 07/11/16 0254  WBC 17.3* 19.8*  HGB 9.2* 9.1*  HCT 29.4* 28.8*  MCV 87.8 88.1  PLT 428* 432*   Basic Metabolic Panel  Recent Labs  07/10/16 0502 07/11/16 0254  NA 138 138  K 4.3 4.4  CL 101 102  CO2 29 29  GLUCOSE 250* 272*  BUN 33* 30*  CREATININE 1.06* 1.13*  CALCIUM 8.6* 8.8*   Liver  Function Tests Lab Results  Component Value Date   ALT 39 07/08/2016   AST 60 (H) 07/08/2016   ALKPHOS 53 07/08/2016   BILITOT 0.8 07/08/2016   Cardiac Enzymes Lab Results  Component Value Date   TROPONINI 1.93 (HH) 07/06/2016   BNP   Recent Labs  06/27/16 1717 07/06/16 0106  BNP 1,283.8* 731.2*   Hemoglobin A1C Lab Results  Component Value Date   HGBA1C 7.3 (H) 06/27/2016   Fasting Lipid Panel Lab Results  Component Value Date   CHOL 170 03/19/2014   HDL 26 (L) 03/19/2014   LDLCALC 128 (H) 03/19/2014   TRIG 79 03/19/2014   CHOLHDL 6.5 03/19/2014   Thyroid Function Tests Lab Results  Component Value Date   TSH 1.615 03/19/2014     Telemetry    Atrial fib, RVR at times - Personally Reviewed  ECG    n/a - Personally Reviewed  Radiology    No results found.  Cardiac Studies   ECHO: 01/15 - Left ventricle: LVEF is normal at 60 to 65% with distal inferior   hypokinesis. The cavity size was normal. Wall  thickness was   increased in a pattern of moderate LVH. Systolic function was   normal. The estimated ejection fraction was in the range of 60%   to 65%. Features are consistent with a pseudonormal left   ventricular filling pattern, with concomitant abnormal relaxation   and increased filling pressure (grade 2 diastolic dysfunction).   Doppler parameters are consistent with high ventricular filling pressure. - Mitral valve: There was mild regurgitation. - Pulmonary arteries: PA peak pressure: 47 mm Hg (S). - Left atrium:  The atrium was normal in size. - Right atrium:  The atrium was normal in size.  Patient Profile     76 y.o. female with a history of RCA 100%, Hypertensive heart disease, hyperlipidemia, diabetes, lumbar disc problem, morbid obesity, former smoker. Admit 01/14-01/18 with LE wounds. Admit 01/25 w/ SOB, atrial fib RVR.   Assessment & Plan    Principal Problem:   Acute on chronic respiratory failure (HCC) - resp status improved, ok for cath - per IM  Active Problems:   Elevated troponin - NSTEMI by ez, hx RCA 100% - for cath today to eval anatomy - continue ASA, BB, high dost statin, heparin    Hypoxia - per IM, see above    Pressure injury of skin - per IM    Flu - PCR positive - per IM    Atrial flutter with rapid ventricular response (HCC) - HR improved but not controlled on IV Cardizem at 15 mg/hr and Coreg 12.5 mg bid - PTA was on verapamil 240 mg qd. - both atrial were normal in size on recent echo - feel she would benefit from SR but with acute illness on steroids and nebs, this will be difficult - MD to review and advise if anti-arrhythmic needed.  Signed, Leanna BattlesBarrett, Rhonda, PA-C  07/11/2016, 1:26 PM    The patient was seen, examined and discussed with Theodore Demarkhonda Barrett, PA-C and I agree with the above.   76 year old female admitted with acuet respiratory failure with hypoxia in the settings of comfirmed influenza, also  NSTEMI - awaiting cath  today, Crea 1.1. Also developed a-flutter with RVR - started here , came in ST, still with RVR on cardizem drip 15 mg/hr. We will arrange for a DCCV tomorrow if she doesn't cardiovert spontaneously.  Tobias AlexanderKatarina Carlson Belland, MD 07/11/2016

## 2016-07-11 NOTE — Clinical Social Work Note (Signed)
Clinical Social Work Assessment  Patient Details  Name: Jamie Mason MRN: [id number] Date of Birth: [birth year]  Date of referral:  07/11/16               Reason for consult:  Discharge Planning                Permission sought to share information with:  Family Supports Permission granted to share information::  Yes, Verbal Permission Granted  Name::        Agency::     Relationship::  Daughter  Contact Information:     Housing/Transportation Living arrangements for the past 2 months:  [place], [place] of Information:  Patient, Adult Children Patient Interpreter Needed:  None Criminal Activity/Legal Involvement Pertinent to Current Situation/Hospitalization:  No - Comment as needed Significant Relationships:  Adult Children, Siblings, Spouse Lives with:  Adult Children, Spouse, Facility Resident Do you feel safe going back to the place where you live?  Yes Need for family participation in patient care:  Yes (Comment)  Care giving concerns:  Patient is a [facility] at [facility].   Social Worker assessment / plan:  CSW met with patient. Daughter at bedside. CSW introduced role and explained that discharge planning would be discussed. Patient was recently discharged to [facility]. She and her daughter state that they do not want her to return. Both patient and daughter inquired about home health physical therapy. Per PT note from last admission, patient lives at home with her husband and adult children. Patient is agreeable to seeing what PT's recommendations are. SNF list provided so she can be looking at other options. Patient will have a cath done today. No further concerns. CSW encouraged patient to contact CSW as needed. CSW will continue to follow patient for support and facilitate discharge to SNF once medically stable if recommended and patient is agreeable.  Employment status:  Retired Programmer, applications PT Recommendations:  Not assessed at this time [place] / Referral to community resources:  [place]  Patient/Family's Response to care:  Patient does not want to return to previous SNF. She is interested in home health PT but is agreeable to seeing what PT recommends. Patient's family supportive and involved in patient's care. Patient and her daughter appreciated social work intervention.  Patient/Family's Understanding of and Emotional Response to Diagnosis, Current Treatment, and Prognosis:  Patient and her daughter have a good understanding of reason for current admission. Patient and her daughter appear happy with hospital care.  Emotional Assessment Appearance:  Appears stated age Attitude/Demeanor/Rapport:  Other (Pleasant) Affect (typically observed):  Accepting, Appropriate, Calm, Pleasant Orientation:  Oriented to Self, Oriented to Place, Oriented to  Time, Oriented to Situation Alcohol / Substance use:  Tobacco Use Psych involvement (Current and /or in the community):  No (Comment)  Discharge Needs  Concerns to be addressed:  Care Coordination Readmission within the last 30 days:  Yes Current discharge risk:  Dependent with Mobility Barriers to Discharge:  Continued Medical Work up, [place], LCSW 07/11/2016, 11:21 AM

## 2016-07-12 ENCOUNTER — Encounter (HOSPITAL_COMMUNITY): Payer: Self-pay | Admitting: Internal Medicine

## 2016-07-12 LAB — BASIC METABOLIC PANEL
Anion gap: 7 (ref 5–15)
BUN: 29 mg/dL — ABNORMAL HIGH (ref 6–20)
CHLORIDE: 102 mmol/L (ref 101–111)
CO2: 26 mmol/L (ref 22–32)
CREATININE: 1.08 mg/dL — AB (ref 0.44–1.00)
Calcium: 8.8 mg/dL — ABNORMAL LOW (ref 8.9–10.3)
GFR calc Af Amer: 58 mL/min — ABNORMAL LOW (ref >60)
GFR calc non Af Amer: 50 mL/min — ABNORMAL LOW (ref >60)
GLUCOSE: 393 mg/dL — AB (ref 65–99)
POTASSIUM: 4.7 mmol/L (ref 3.5–5.1)
SODIUM: 135 mmol/L (ref 135–145)

## 2016-07-12 LAB — CBC
HCT: 29.2 % — ABNORMAL LOW (ref 36.0–46.0)
Hemoglobin: 9.3 g/dL — ABNORMAL LOW (ref 12.0–15.0)
MCH: 28.1 pg (ref 26.0–34.0)
MCHC: 31.8 g/dL (ref 30.0–36.0)
MCV: 88.2 fL (ref 78.0–100.0)
PLATELETS: 422 10*3/uL — AB (ref 150–400)
RBC: 3.31 MIL/uL — AB (ref 3.87–5.11)
RDW: 16.1 % — ABNORMAL HIGH (ref 11.5–15.5)
WBC: 18.5 10*3/uL — AB (ref 4.0–10.5)

## 2016-07-12 LAB — GLUCOSE, CAPILLARY
Glucose-Capillary: 357 mg/dL — ABNORMAL HIGH (ref 65–99)
Glucose-Capillary: 359 mg/dL — ABNORMAL HIGH (ref 65–99)
Glucose-Capillary: 347 mg/dL — ABNORMAL HIGH (ref 65–99)
Glucose-Capillary: 330 mg/dL — ABNORMAL HIGH (ref 65–99)

## 2016-07-12 LAB — HEPARIN LEVEL (UNFRACTIONATED): Heparin Unfractionated: 0.3 IU/mL (ref 0.30–0.70)

## 2016-07-12 MED ORDER — AMIODARONE HCL IN DEXTROSE 360-4.14 MG/200ML-% IV SOLN: 60.0000 mg/h | INTRAVENOUS | Status: DC

## 2016-07-12 MED ORDER — APIXABAN 5 MG PO TABS
5.0000 mg | ORAL_TABLET | Freq: Two times a day (BID) | ORAL | Status: DC
  Administered 2016-07-12 – 2016-07-16 (×8): 5 mg via ORAL
  Filled 2016-07-12 (×9): qty 1

## 2016-07-12 MED ORDER — FUROSEMIDE 10 MG/ML IJ SOLN
40.0000 mg | Freq: Two times a day (BID) | INTRAMUSCULAR | Status: AC
  Administered 2016-07-12 – 2016-07-13 (×2): 40 mg via INTRAVENOUS
  Filled 2016-07-12 (×2): qty 4

## 2016-07-12 MED ORDER — AMIODARONE HCL IN DEXTROSE 360-4.14 MG/200ML-% IV SOLN: 30.0000 mg/h | INTRAVENOUS | Status: DC

## 2016-07-12 MED ORDER — AMIODARONE LOAD VIA INFUSION
150.0000 mg | Freq: Once | INTRAVENOUS | Status: AC
  Administered 2016-07-12: 150 mg via INTRAVENOUS
  Filled 2016-07-12: qty 83.34

## 2016-07-12 MED ORDER — AMIODARONE HCL IN DEXTROSE 360-4.14 MG/200ML-% IV SOLN
60.0000 mg/h | INTRAVENOUS | Status: AC
  Administered 2016-07-12: 60 mg/h via INTRAVENOUS
  Filled 2016-07-12: qty 200

## 2016-07-12 MED ORDER — AMIODARONE HCL IN DEXTROSE 360-4.14 MG/200ML-% IV SOLN
30.0000 mg/h | INTRAVENOUS | Status: DC
  Administered 2016-07-13 – 2016-07-15 (×4): 30 mg/h via INTRAVENOUS
  Filled 2016-07-12 (×5): qty 200

## 2016-07-12 MED ORDER — SODIUM CHLORIDE 0.9 % IV SOLN
INTRAVENOUS | Status: DC
  Administered 2016-07-12: 19:00:00 via INTRAVENOUS

## 2016-07-12 NOTE — Progress Notes (Signed)
Patient Name: Jamie Mason Date of Encounter: 07/12/2016  [facility]  H[facility] Problem List     Principal Problem:   Acute on chronic respiratory failure [facility](HCC) Active Problems:   Elevated troponin   Hypoxia   Pressure injury of skin   Flu   Atrial flutter with rapid ventricular response (HCC)   Non-ST elevation (NSTEMI) myocardial infarction [facility](HCC)   Subjective   The pateint feels better today. She is tired.  Inpatient Medications    Scheduled Meds: . aspirin EC  81 mg Oral Daily  . atorvastatin  40 mg Oral q1800  . carvedilol  12.5 mg Oral BID WC  . chlorhexidine  15 mL Mouth Rinse BID  . furosemide  40 mg Oral Daily  . insulin aspart  0-20 Units Subcutaneous TID WC  . insulin aspart  0-5 Units Subcutaneous QHS  . mouth rinse  15 mL Mouth Rinse q12n4p  . methylPREDNISolone (SOLU-MEDROL) injection  40 mg Intravenous Daily  . nitrofurantoin (macrocrystal-monohydrate)  100 mg Oral Q12H  . senna-docusate  1 tablet Oral BID  . sodium chloride flush  3 mL Intravenous Q12H  . sodium chloride flush  3 mL Intravenous Q12H   Continuous Infusions: . dilTIAZem HCl-Dextrose 15 mg/hr (07/12/16 1154)  . heparin 1,150 Units/hr (07/12/16 1154)   PRN Meds: sodium chloride, sodium chloride, sodium chloride, acetaminophen, albuterol, ondansetron (ZOFRAN) IV, sodium chloride flush, sodium chloride flush   Vital Signs    Vitals:   07/11/16 2021 07/11/16 2347 07/12/16 0502 07/12/16 1156  BP: (!) 155/128 (!) 155/84 (!) 163/71 (!) 141/56  Pulse: 98 93 93 77  Resp: 16 16 18 16   Temp:  98.3 F (36.8 C) 98.6 F (37 C) 98.4 F (36.9 C)  TempSrc:  Oral Oral Oral  SpO2: 100% 100% 100% 100%  Weight:   (!) 304 lb 12.8 oz (138.3 kg)   Height:        Intake/Output Summary (Last 24 hours) at 07/12/16 1235 Last data filed at 07/12/16 0940  Gross per 24 hour  Intake          3051.18 ml  Output              750 ml  Net          2301.18 ml   Filed Weights   07/10/16 0300 07/11/16 0516 07/12/16 0502  Weight: 299 lb (135.6 kg) (!) 300 lb 14.4 oz (136.5 kg) (!) 304 lb 12.8 oz (138.3 kg)    Physical Exam    GEN: Well nourished, well developed, in no acute distress.  HEENT: Grossly normal.  Neck: Supple, no JVD, carotid bruits, or masses. Cardiac: Irreg R&R, soft murmur, no rubs, or gallops. No clubbing, cyanosis, edema.  Radials/DP/PT 2+ and equal bilaterally.  Respiratory:  Respirations regular and unlabored, decreased BS bases w/ few rales bilaterally. GI: Soft, nontender, nondistended, BS + x 4. MS: no deformity or atrophy. Skin: warm and dry, no rash. Neuro:  Strength and sensation are intact. Psych: AAOx3.  Normal affect.  Labs    CBC  Recent Labs  07/11/16 0254 07/12/16 0539  WBC 19.8* 18.5*  HGB 9.1* 9.3*  HCT 28.8* 29.2*  MCV 88.1 88.2  PLT 432* 422*   Basic Metabolic Panel  Recent Labs  07/10/16 0502 07/11/16 0254  NA 138 138  K 4.3 4.4  CL 101 102  CO2 29 29  GLUCOSE 250* 272*  BUN 33* 30*  CREATININE 1.06* 1.13*  CALCIUM 8.6*  8.8*   Liver Function Tests Lab Results  Component Value Date   ALT 39 07/08/2016   AST 60 (H) 07/08/2016   ALKPHOS 53 07/08/2016   BILITOT 0.8 07/08/2016   Cardiac Enzymes Lab Results  Component Value Date   TROPONINI 1.93 (HH) 07/06/2016   BNP   Recent Labs  06/27/16 1717 07/06/16 0106  BNP 1,283.8* 731.2*   Hemoglobin A1C Lab Results  Component Value Date   HGBA1C 7.3 (H) 06/27/2016   Fasting Lipid Panel Lab Results  Component Value Date   CHOL 170 03/19/2014   HDL 26 (L) 03/19/2014   LDLCALC 128 (H) 03/19/2014   TRIG 79 03/19/2014   CHOLHDL 6.5 03/19/2014   Thyroid Function Tests Lab Results  Component Value Date   TSH 1.615 03/19/2014     Telemetry    Atrial fib, RVR at times - Personally Reviewed  ECG    n/a - Personally Reviewed  Radiology    No results found.  Cardiac Studies   ECHO: 01/15 - Left ventricle: LVEF is normal at 60 to  65% with distal inferior   hypokinesis. The cavity size was normal. Wall thickness was   increased in a pattern of moderate LVH. Systolic function was   normal. The estimated ejection fraction was in the range of 60%   to 65%. Features are consistent with a pseudonormal left   ventricular filling pattern, with concomitant abnormal relaxation   and increased filling pressure (grade 2 diastolic dysfunction).   Doppler parameters are consistent with high ventricular filling pressure. - Mitral valve: There was mild regurgitation. - Pulmonary arteries: PA peak pressure: 47 mm Hg (S). - Left atrium:  The atrium was normal in size. - Right atrium:  The atrium was normal in size.  Patient Profile     76 y.o. female with a history of RCA 100%, Hypertensive heart disease, hyperlipidemia, diabetes, lumbar disc problem, morbid obesity, former smoker. Admit 01/14-01/18 with LE wounds. Admit 01/25 w/ SOB, atrial fib RVR.   Assessment & Plan     1. Acute on chronic respiratory failure (HCC) - resp status improved, - per IM  2. Demand ischemia with  Elevated troponin Cath on 07/11/16: 1. 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 2. Moderate disease (40-60%) involving the distal LMCA, mid and distal LAD, ostial LCx, and OM2. 3. Moderately elevated left ventricular filling pressure (LVEDP 28-30 mmHg). Recommendations: 1. Medical therapy of elevated troponin likely due to supply-demand mismatch. 2. Aggressive secondary prevention and management of atrial fibrillation with rapid ventricular response. 3. Escalate diuresis beginning tomorrow. Would try to maintain net even to slightly negative fluid balance overnight given contrast load and recent acute kidney injury. - continue ASA, BB, high dost statin,    3.  Flu - PCR positive - per IM  4. Atrial flutter with rapid ventricular response (HCC) - HR improved but not controlled on IV Cardizem at 15 mg/hr and Coreg 12.5 mg  bid - PTA was on verapamil 240 mg qd. - both atrial were normal in size on recent echo - feel she would benefit from SR but with acute illness on steroids and nebs, this will be difficult - MD to review and advise if anti-arrhythmic needed. - scheduled for a DCCV for tomorrow.  5. Acute diastolic CHF - change lasix to 40 mg iv Q12 H x 2 doses, reevaluate in the morning.  6. Hypertension - adjust meds post DCCV   Tobias Alexander, MD 07/12/2016

## 2016-07-12 NOTE — Progress Notes (Signed)
PROGRESS NOTE    Jamie Mason  ZOX:[id number] DOB: 11/09/1940 DOA: 07/06/2016 PCP: Blanch MediaBUTCHER,ELIZABETH, MD   Chief Complaint  Patient presents with  . Respiratory Distress    Brief Narrative:  76 yo F with PMH of dCHF, morbid obesity,admitted 1/24 with SOB. 1/24 she was brought in via EMS from her nursing home for respiratory distress. She was found to have sats in the 80's, fever to 102.5 and was placed on CPAP with improvement.  Influenza panel was positive.  She required BIPAP support with improvement.  Pt was transferred out of ICU 1/25.   Assessment & Plan   Acute hypoxic respiratory distress -Secondary to influenza, CHF exacerbation -Patient required BiPAP support upon admission, currently on nasal cannula -Continue supplemental oxygen to maintain saturations above 92% -Continue steroids, nebs, Tamiflu  Sepsis secondary to Influenza A -Patient presented with tachycardia, tachypnea, leukocytosis with an elevated lactic acid of 4.49 -Treatment plan as above  NSTEMI/CAD -Cardiology consulted and appreciated -patient has known occlusion of RCA -Continue Coreg, statin, aspirin -Heparin held due to hematuria- will restart heparin today and monitor closely -heart catheterization 07/11/2016: chronic total occlusion of mid RCA with left to right collaterals. 40-60% moderate disease to Distal MCA, mid/distal LAD, ostial LCx, OM2. Elevated LV filling prssure 28-5230mmHg.  Recommended medical therapy, diuresis, management of Afib.  Acute on chronic diastolic heart failure -Appears to be improving -Echocardiogram 06/27/2016 showed an EF of 60-65%, grade 2 diastolic dysfunction -Patient currently appears to be euvolemic -Continue Lasix 40mg  daily PO -Monitor intake and output, daily weights -Pending further recommendations from cardiology regarding diuresis given cath findings.   AKI on CKD, stage III -Creatinine peaked to 1.95, currently trending downward -Creatinine 1.13 - pending  BMP today -Continue to monitor BMP  Paroxsymal Atrial fibrillation -Continue coreg, aspirin  -Heparin held due to hematuria- restarted today -Patient HR was in the 130s yesterday. Coreg increased but ineffective. Placed on cardizem drip -Continue cardizem- will try to wean off of the drip  Hematuria -Possibly secondary to heparin drip -hemoglobin 9.3  ?UTI -Urine culture >100K GPC- VRE -Repeat UA/culture pending not convincing of infection and patient denies any dysuria-  Repeat UA unremarkable/Urine culture shows no growth -Will likely discontinue macrobid   Normocytic anemia -Hemoglobin currently 9.1 -Continue to monitor CBC  Deconditioning -PT and OT consulted -OT rec SNF  DVT Prophylaxis  SCDs, Heparin  Code Status: Full  Family Communication: None at bedside  Disposition Plan: Admitted. Wean off of cardizem drip if able. SNF when medically stable  Consultants Cardiology PCCM  Procedures  Heart catheterization  Antibiotics   Anti-infectives    Start     Dose/Rate Route Frequency Ordered Stop   07/08/16 0400  vancomycin (VANCOCIN) 1,750 mg in sodium chloride 0.9 % 500 mL IVPB  Status:  Discontinued     1,750 mg 250 mL/hr over 120 Minutes Intravenous Every 48 hours 07/06/16 0409 07/06/16 1011   07/08/16 0000  levofloxacin (LEVAQUIN) IVPB 750 mg  Status:  Discontinued     750 mg 100 mL/hr over 90 Minutes Intravenous Every 48 hours 07/06/16 0409 07/06/16 0413   07/06/16 1400  aztreonam (AZACTAM) 1 g in dextrose 5 % 50 mL IVPB  Status:  Discontinued     1 g 100 mL/hr over 30 Minutes Intravenous Every 8 hours 07/06/16 0409 07/07/16 0848   07/06/16 1000  oseltamivir (TAMIFLU) capsule 30 mg  Status:  Discontinued     30 mg Oral Daily 07/06/16 0347 07/06/16 0424   07/06/16  1000  oseltamivir (TAMIFLU) capsule 30 mg     30 mg Oral 2 times daily 07/06/16 0424 07/10/16 2210   07/06/16 0230  vancomycin (VANCOCIN) 1,500 mg in sodium chloride 0.9 % 500 mL IVPB     1,500  mg 250 mL/hr over 120 Minutes Intravenous  Once 07/06/16 0202 07/06/16 0834   07/06/16 0200  levofloxacin (LEVAQUIN) IVPB 750 mg     750 mg 100 mL/hr over 90 Minutes Intravenous  Once 07/06/16 0155 07/06/16 0444   07/06/16 0200  aztreonam (AZACTAM) 2 g in dextrose 5 % 50 mL IVPB     2 g 100 mL/hr over 30 Minutes Intravenous  Once 07/06/16 0155 07/06/16 0512   07/06/16 0200  vancomycin (VANCOCIN) IVPB 1000 mg/200 mL premix  Status:  Discontinued     1,000 mg 200 mL/hr over 60 Minutes Intravenous  Once 07/06/16 0155 07/06/16 0200      Subjective:   Jamie Mason seen and examined today.  Patient states she is feeling better.  Feels breathing has improved. Has occasional cough. Denies chest pain, abdominal pain, nausea or vomiting, dizziness or headache.   Objective:   Vitals:   07/11/16 2006 07/11/16 2021 07/11/16 2347 07/12/16 0502  BP: 126/87 (!) 155/128 (!) 155/84 (!) 163/71  Pulse:  98 93 93  Resp:  16 16 18   Temp:   98.3 F (36.8 C) 98.6 F (37 C)  TempSrc:   Oral Oral  SpO2:  100% 100% 100%  Weight:    (!) 138.3 kg (304 lb 12.8 oz)  Height:        Intake/Output Summary (Last 24 hours) at 07/12/16 1140 Last data filed at 07/12/16 0940  Gross per 24 hour  Intake          3051.18 ml  Output              750 ml  Net          2301.18 ml   Filed Weights   07/10/16 0300 07/11/16 0516 07/12/16 0502  Weight: 135.6 kg (299 lb) (!) 136.5 kg (300 lb 14.4 oz) (!) 138.3 kg (304 lb 12.8 oz)    Exam  General: Well developed, well nourished, NAD  HEENT: NCAT,mucous membranes moist.   Cardiovascular: S1 S2 auscultated, no rubs, murmurs or gallops. irregular  Respiratory: Clear, occ cough (nonproductive)  Abdomen: Soft, obese, nontender, nondistended, + bowel sounds  Extremities: warm dry without cyanosis clubbing or edema  Neuro: AAOx3, nonfocal  Psych: Normal affect and demeanor, pleasant   Data Reviewed: I have personally reviewed following labs and imaging  studies  CBC:  Recent Labs Lab 07/06/16 0106  07/07/16 0921 07/08/16 0424 07/09/16 0419 07/10/16 0502 07/11/16 0254 07/12/16 0539  WBC 12.5*  < > 24.2* 20.6* 18.4* 17.3* 19.8* 18.5*  NEUTROABS 6.5  --  20.9*  --   --   --   --   --   HGB 9.6*  < > 9.3* 9.0* 9.6* 9.2* 9.1* 9.3*  HCT 29.8*  < > 28.9* 28.1* 30.4* 29.4* 28.8* 29.2*  MCV 88.2  < > 87.8 87.8 88.4 87.8 88.1 88.2  PLT 367  < > 399 433* 461* 428* 432* 422*  < > = values in this interval not displayed. Basic Metabolic Panel:  Recent Labs Lab 07/07/16 0921 07/08/16 0424 07/09/16 0419 07/10/16 0502 07/11/16 0254  NA 140 139 138 138 138  K 3.9 3.7 5.0 4.3 4.4  CL 101 100* 97* 101 102  CO2 28  30 30 29 29   GLUCOSE 195* 185* 204* 250* 272*  BUN 29* 34* 40* 33* 30*  CREATININE 1.57* 1.44* 1.28* 1.06* 1.13*  CALCIUM 8.4* 8.3* 8.6* 8.6* 8.8*   GFR: Estimated Creatinine Clearance: 62.6 mL/min (by C-G formula based on SCr of 1.13 mg/dL (H)). Liver Function Tests:  Recent Labs Lab 07/06/16 0135 07/07/16 0921 07/08/16 0424  AST 76* 99* 60*  ALT 30 45 39  ALKPHOS 58 52 53  BILITOT 0.5 0.6 0.8  PROT 7.8 7.5 7.4  ALBUMIN 2.7* 2.5* 2.3*   No results for input(s): LIPASE, AMYLASE in the last 168 hours. No results for input(s): AMMONIA in the last 168 hours. Coagulation Profile:  Recent Labs Lab 07/11/16 0929  INR 1.14   Cardiac Enzymes:  Recent Labs Lab 07/06/16 0106 07/06/16 0356 07/06/16 1427  TROPONINI 3.18* 2.23* 1.93*   BNP (last 3 results) No results for input(s): PROBNP in the last 8760 hours. HbA1C: No results for input(s): HGBA1C in the last 72 hours. CBG:  Recent Labs Lab 07/11/16 0638 07/11/16 1230 07/11/16 1637 07/11/16 2125 07/12/16 0538  GLUCAP 256* 229* 211* 229* 357*   Lipid Profile: No results for input(s): CHOL, HDL, LDLCALC, TRIG, CHOLHDL, LDLDIRECT in the last 72 hours. Thyroid Function Tests: No results for input(s): TSH, T4TOTAL, FREET4, T3FREE, THYROIDAB in the last  72 hours. Anemia Panel: No results for input(s): VITAMINB12, FOLATE, FERRITIN, TIBC, IRON, RETICCTPCT in the last 72 hours. Urine analysis:    Component Value Date/Time   COLORURINE YELLOW 07/10/2016 1021   APPEARANCEUR CLEAR 07/10/2016 1021   LABSPEC 1.018 07/10/2016 1021   PHURINE 6.0 07/10/2016 1021   GLUCOSEU 50 (A) 07/10/2016 1021   HGBUR SMALL (A) 07/10/2016 1021   BILIRUBINUR NEGATIVE 07/10/2016 1021   BILIRUBINUR Negative 03/19/2014 1631   KETONESUR NEGATIVE 07/10/2016 1021   PROTEINUR NEGATIVE 07/10/2016 1021   UROBILINOGEN 0.2 03/19/2014 1631   NITRITE NEGATIVE 07/10/2016 1021   LEUKOCYTESUR SMALL (A) 07/10/2016 1021   Sepsis Labs: @LABRCNTIP (procalcitonin:4,lacticidven:4)  ) Recent Results (from the past 240 hour(s))  Respiratory Panel by PCR     Status: Abnormal   Collection Time: 07/06/16  1:00 AM  Result Value Ref Range Status   Adenovirus NOT DETECTED NOT DETECTED Final   Coronavirus 229E NOT DETECTED NOT DETECTED Final   Coronavirus HKU1 NOT DETECTED NOT DETECTED Final   Coronavirus NL63 NOT DETECTED NOT DETECTED Final   Coronavirus OC43 NOT DETECTED NOT DETECTED Final   Metapneumovirus NOT DETECTED NOT DETECTED Final   Rhinovirus / Enterovirus NOT DETECTED NOT DETECTED Final   Influenza A H3 DETECTED (A) NOT DETECTED Final   Influenza B NOT DETECTED NOT DETECTED Final   Parainfluenza Virus 1 NOT DETECTED NOT DETECTED Final   Parainfluenza Virus 2 NOT DETECTED NOT DETECTED Final   Parainfluenza Virus 3 NOT DETECTED NOT DETECTED Final   Parainfluenza Virus 4 NOT DETECTED NOT DETECTED Final   Respiratory Syncytial Virus NOT DETECTED NOT DETECTED Final   Bordetella pertussis NOT DETECTED NOT DETECTED Final   Chlamydophila pneumoniae NOT DETECTED NOT DETECTED Final   Mycoplasma pneumoniae NOT DETECTED NOT DETECTED Final  Blood Culture (routine x 2)     Status: None   Collection Time: 07/06/16  1:05 AM  Result Value Ref Range Status   Specimen Description  BLOOD LEFT ARM  Final   Special Requests BOTTLES DRAWN AEROBIC AND ANAEROBIC  Final   Culture NO GROWTH 5 DAYS  Final   Report Status 07/11/2016 FINAL  Final  Blood Culture (routine x 2)     Status: None   Collection Time: 07/06/16  1:35 AM  Result Value Ref Range Status   Specimen Description BLOOD RIGHT HAND  Final   Special Requests BOTTLES DRAWN AEROBIC AND ANAEROBIC  Final   Culture NO GROWTH 5 DAYS  Final   Report Status 07/11/2016 FINAL  Final  MRSA PCR Screening     Status: None   Collection Time: 07/06/16  6:47 AM  Result Value Ref Range Status   MRSA by PCR NEGATIVE NEGATIVE Final    Comment:        The GeneXpert MRSA Assay (FDA approved for NASAL specimens only), is one component of a comprehensive MRSA colonization surveillance program. It is not intended to diagnose MRSA infection nor to guide or monitor treatment for MRSA infections.   Urine culture     Status: Abnormal   Collection Time: 07/06/16 10:40 AM  Result Value Ref Range Status   Specimen Description URINE, RANDOM  Final   Special Requests NONE  Final   Culture (A)  Final    >=100,000 COLONIES/mL VANCOMYCIN RESISTANT ENTEROCOCCUS   Report Status 07/10/2016 FINAL  Final   Organism ID, Bacteria VANCOMYCIN RESISTANT ENTEROCOCCUS (A)  Final      Susceptibility   Vancomycin resistant enterococcus - MIC*    AMPICILLIN <=2 SENSITIVE Sensitive     LEVOFLOXACIN >=8 RESISTANT Resistant     NITROFURANTOIN <=16 SENSITIVE Sensitive     VANCOMYCIN >=32 RESISTANT Resistant     LINEZOLID 2 SENSITIVE Sensitive     * >=100,000 COLONIES/mL VANCOMYCIN RESISTANT ENTEROCOCCUS  Culture, Urine     Status: None   Collection Time: 07/10/16 10:21 AM  Result Value Ref Range Status   Specimen Description URINE, RANDOM  Final   Special Requests NONE  Final   Culture NO GROWTH  Final   Report Status 07/11/2016 FINAL  Final      Radiology Studies: No results found.   Scheduled Meds: . aspirin EC  81 mg Oral  Daily  . atorvastatin  40 mg Oral q1800  . carvedilol  12.5 mg Oral BID WC  . chlorhexidine  15 mL Mouth Rinse BID  . furosemide  40 mg Oral Daily  . insulin aspart  0-20 Units Subcutaneous TID WC  . insulin aspart  0-5 Units Subcutaneous QHS  . mouth rinse  15 mL Mouth Rinse q12n4p  . methylPREDNISolone (SOLU-MEDROL) injection  40 mg Intravenous Daily  . nitrofurantoin (macrocrystal-monohydrate)  100 mg Oral Q12H  . senna-docusate  1 tablet Oral BID  . sodium chloride flush  3 mL Intravenous Q12H  . sodium chloride flush  3 mL Intravenous Q12H   Continuous Infusions: . dilTIAZem HCl-Dextrose 15 mg/hr (07/12/16 0559)  . heparin 1,100 Units/hr (07/12/16 0245)     LOS: 6 days   Time Spent in minutes   30 minutes  Hagan Vanauken D.O. on 07/12/2016 at 11:40 AM  Between 7am to 7pm - Pager - [phone]  After 7pm go to [email or web address] - password [email or web address]  And look for the night coverage person covering for me after hours  [facility] Office  [phone]

## 2016-07-12 NOTE — Progress Notes (Signed)
ANTICOAGULATION CONSULT NOTE - Follow Up Consult  Pharmacy Consult for heparin Indication: NSTEMI, afib  Patient Measurements: Height: 5\' 5"  (165.1 cm) Weight: (!) 304 lb 12.8 oz (138.3 kg) IBW/kg (Calculated) : 57 Heparin Dosing Weight: 90.9 kg  Vital Signs: Temp: 98.6 F (37 C) (01/30 0502) Temp Source: Oral (01/30 0502) BP: 163/71 (01/30 0502) Pulse Rate: 93 (01/30 0502)  Labs:  Recent Labs  07/10/16 0502  07/11/16 0254 07/11/16 0929 07/11/16 1339 07/12/16 0539 07/12/16 0943  HGB 9.2*  --  9.1*  --   --  9.3*  --   HCT 29.4*  --  28.8*  --   --  29.2*  --   PLT 428*  --  432*  --   --  422*  --   LABPROT  --   --   --  14.6  --   --   --   INR  --   --   --  1.14  --   --   --   HEPARINUNFRC  --   < > 0.29*  --  0.51  --  0.30  CREATININE 1.06*  --  1.13*  --   --   --   --   < > = values in this interval not displayed.  Estimated Creatinine Clearance: 62.6 mL/min (by C-G formula based on SCr of 1.13 mg/dL (H)).   Assessment: [age] yoF admitted 1/24 for sepsis and NSTEMI, started on heparin drip.  Previously with hematuria requiring infusion to be held, resumed on 1/28. Heparin resumed post-cath and initial heparin level is at goal at 0.3 but at the very lower edge of goal range. No new bleeding noted.   Goal of Therapy:  Heparin level 0.3-0.7 units/ml Monitor platelets by anticoagulation protocol: Yes   Plan:  Increase heparin gtt slightly to 1150 units/hr Daily heparin level and CBC  Lysle Pearlachel Codie Hainer, PharmD, BCPS Pager # [phone] 07/12/2016 10:41 AM

## 2016-07-12 NOTE — Progress Notes (Signed)
Paged and spoke to A. Duke, PA-C to inform her that pt's a-fib HR is slowly elevating to unsustained HR 114.  Pt stable and asymptomatic.  Therefore, orders received to increase current Cardizem gtts from 15mL to 3520mL/h.  Cardiology further stated that they are ok with HR <120, as long as pt is tolerating cardizem ok.  Pt is anticipating to have cardioverison tomorrow, 1.31.18.

## 2016-07-12 NOTE — Progress Notes (Signed)
Amiodarone drip  not started pending transfer to 3w. Drip needs to be titrated and therefore cannot be started on the unit.

## 2016-07-12 NOTE — Progress Notes (Signed)
  Amiodarone Drug - Drug Interaction Consult Note  Amiodarone is metabolized by the cytochrome P450 system and therefore has the potential to cause many drug interactions. Amiodarone has an average plasma half-life of 50 days (range 20 to 100 days).   There is potential for drug interactions to occur several weeks or months after stopping treatment and the onset of drug interactions may be slow after initiating amiodarone.   [x]  Statins: Increased risk of myopathy. Simvastatin- restrict dose to 20mg  daily. Other statins: counsel patients to report any muscle pain or weakness immediately.  []  Anticoagulants: Amiodarone can increase anticoagulant effect. Consider warfarin dose reduction. Patients should be monitored closely and the dose of anticoagulant altered accordingly, remembering that amiodarone levels take several weeks to stabilize.  []  Antiepileptics: Amiodarone can increase plasma concentration of phenytoin, the dose should be reduced. Note that small changes in phenytoin dose can result in large changes in levels. Monitor patient and counsel on signs of toxicity.  []  Beta blockers: increased risk of bradycardia, AV block and myocardial depression. Sotalol - avoid concomitant use.  [x]   Calcium channel blockers (diltiazem and verapamil): increased risk of bradycardia, AV block and myocardial depression.  []   Cyclosporine: Amiodarone increases levels of cyclosporine. Reduced dose of cyclosporine is recommended.  []  Digoxin dose should be halved when amiodarone is started.  [x]  Diuretics: increased risk of cardiotoxicity if hypokalemia occurs.  []  Oral hypoglycemic agents (glyburide, glipizide, glimepiride): increased risk of hypoglycemia. Patient's glucose levels should be monitored closely when initiating amiodarone therapy.   []  Drugs that prolong the QT interval:  Torsades de pointes risk may be increased with concurrent use - avoid if possible.  Monitor QTc, also keep  magnesium/potassium WNL if concurrent therapy can't be avoided. Marland Kitchen. Antibiotics: e.g. fluoroquinolones, erythromycin. . Antiarrhythmics: e.g. quinidine, procainamide, disopyramide, sotalol. . Antipsychotics: e.g. phenothiazines, haloperidol.  . Lithium, tricyclic antidepressants, and methadone.  Thank You,   Pollyann SamplesAndy Magic Mohler, PharmD, BCPS 07/12/2016, 7:51 PM

## 2016-07-12 NOTE — Progress Notes (Signed)
All dressing changed as per order, by student nurses with instructor.

## 2016-07-12 NOTE — Evaluation (Signed)
Occupational Therapy Evaluation Patient Details Name: Jamie Mason MRN: [id number] DOB: 09/01/1940 Today's Date: 07/12/2016    History of Present Illness Pt came from SNF in which she was admitted for rehab after recent hospitalization with acute on chronic respiratory failure, flu and NSTEMI. Pt underwent cardiac catherization 1/29. PMH: CAD, CHF, CKD, morbid obesity, DM, HTN LDD, chronic LE wounds.    Clinical Impression   At her baseline, pt walks with a cane and is able to care for herself and prepare light meals. Pt presents with decreased activity tolerance, impaired balance and obesity limiting her ability to perform self care and mobility. Will follow acutely. Due decreased caregiver support, recommending post acute rehab in SNF.    Follow Up Recommendations  SNF    Equipment Recommendations  None recommended by OT    Recommendations for Other Services       Precautions / Restrictions Precautions Precautions: Fall Restrictions Weight Bearing Restrictions: No      Mobility Bed Mobility               General bed mobility comments: pt OOB upon arrival  Transfers Overall transfer level: Needs assistance Equipment used: None Transfers: Sit to/from Stand;Stand Pivot Transfers Sit to Stand: Min assist Stand pivot transfers: Min assist       General transfer comment: Assist to rise, stabilize.    Balance Overall balance assessment: Needs assistance   Sitting balance-Leahy Scale: Fair       Standing balance-Leahy Scale: Poor Standing balance comment: requires B UE support                            ADL Overall ADL's : Needs assistance/impaired Eating/Feeding: Independent;Sitting   Grooming: Wash/dry hands;Wash/dry face;Sitting;Set up;Applying deodorant   Upper Body Bathing: Minimal assistance;Sitting   Lower Body Bathing: Sit to/from stand;Total assistance   Upper Body Dressing : Minimal assistance;Sitting   Lower Body Dressing:  Total assistance;Sitting/lateral leans   Toilet Transfer: Minimal assistance;Stand-pivot;BSC   Toileting- Clothing Manipulation and Hygiene: Total assistance;Sit to/from stand         General ADL Comments: Pt with poor activity tolerance interfering with ability to perform self care.     Vision     Perception     Praxis      Pertinent Vitals/Pain Pain Assessment: No/denies pain     Hand Dominance Right   Extremity/Trunk Assessment Upper Extremity Assessment Upper Extremity Assessment: Overall WFL for tasks assessed   Lower Extremity Assessment Lower Extremity Assessment: Defer to PT evaluation       Communication Communication Communication: No difficulties   Cognition Arousal/Alertness: Awake/alert Behavior During Therapy: WFL for tasks assessed/performed Overall Cognitive Status: Within Functional Limits for tasks assessed                 General Comments: pt stating she was going to come apply to the hospital when she got well and work in the [employer].   General Comments       Exercises       Shoulder Instructions      Home Living Family/patient expects to be discharged to:: Skilled nursing facility Living Arrangements: Spouse/significant other;Children Available Help at Discharge: Family;Available 24 hours/day   Home Access: Stairs to enter Enter[employer] CorporationEntrance Stairs-Number of Steps: 2 Entrance Stairs-Rails: Right Home Layout: One level               Home Equipment: Cane - single point   Additional  Comments: husband is blind, daughter works      Prior Functioning/Environment Level of Independence: Independent with assistive device(s)        Comments: walks with cane, sleeps in recliner, sponge bathes        OT Problem List: Decreased strength;Decreased activity tolerance;Cardiopulmonary status limiting activity;Decreased safety awareness;Decreased knowledge of use of DME or AE;Obesity;Impaired balance (sitting and/or standing)    OT Treatment/Interventions: Self-care/ADL training;DME and/or AE instruction;Patient/family education;Energy conservation;Therapeutic activities    OT Goals(Current goals can be found in the care plan section) Acute Rehab OT Goals Patient Stated Goal: to return home OT Goal Formulation: With patient Time For Goal Achievement: 07/26/16 Potential to Achieve Goals: Good ADL Goals Pt Will Perform Grooming: with min guard assist;standing (one activity) Pt Will Perform Upper Body Dressing: with set-up;standing;with adaptive equipment Pt Will Perform Lower Body Dressing: with min guard assist;sit to/from stand;with adaptive equipment Pt Will Transfer to Toilet: with min guard assist;ambulating;bedside commode Pt Will Perform Toileting - Clothing Manipulation and hygiene: with min assist;with adaptive equipment;sit to/from stand Additional ADL Goal #1: Pt will employ energy conservation strategies in ADL and mobility with min verbal cues.  OT Frequency: Min 2X/week   Barriers to D/C: Decreased caregiver support          Co-evaluation              End of Session Equipment Utilized During Treatment: Oxygen Nurse Communication:  (aware of snacks given to pt)  Activity Tolerance: Patient limited by fatigue Patient left: in chair;with call bell/phone within reach   Time: 1610-9604 OT Time Calculation (min): 38 min Charges:  OT General Charges $OT Visit: 1 Procedure OT Evaluation $OT Eval Moderate Complexity: 1 Procedure OT Treatments $Self Care/Home Management : 23-37 mins G-Codes:    Evern Bio 07/12/2016, 9:47 AM  [phone]

## 2016-07-12 NOTE — Evaluation (Signed)
Physical Therapy Evaluation Patient Details Name: Jamie Mason MRN: [id number] DOB: 10/17/1940 Today's Date: 07/12/2016   History of Present Illness  Pt came from SNF in which she was admitted for rehab after recent hospitalization with acute on chronic respiratory failure, flu and NSTEMI. Pt underwent cardiac catherization 1/29. PMH: CAD, CHF, CKD, morbid obesity, DM, HTN LDD, chronic LE wounds.   Clinical Impression  Pt was up to walk with PT on the RW in her room and noted her difficulty managing all her lines and esp O2 cannula.  Her plan is to follow acutely for gait and balance and strengthening as tolerated.  Has been fine with O2 at 100% but pulse elevated from 90 to 132 with gait and was slow to decline.    Follow Up Recommendations SNF    Equipment Recommendations  None recommended by PT    Recommendations for Other Services       Precautions / Restrictions Precautions Precautions: Fall Precaution Comments: Larey Seat from a chair several weeks ago Restrictions Weight Bearing Restrictions: No      Mobility  Bed Mobility               General bed mobility comments: up when PT arrived  Transfers Overall transfer level: Needs assistance Equipment used: None Transfers: Sit to/from [employer] Sit to Stand: Min assist Stand pivot transfers: Min assist       General transfer comment: stabilization and assist to descend  Ambulation/Gait Ambulation/Gait assistance: Min assist;+2 physical assistance;+2 safety/equipment Ambulation Distance (Feet): 14 Feet Assistive device: Rolling walker (2 wheeled) Gait Pattern/deviations: Step-through pattern;Decreased stride length;Wide base of support;Drifts right/left;Trunk flexed Gait velocity: reduced Gait velocity interpretation: Below normal speed for age/gender (pulse rate was 132 with 14' walk) General Gait Details: Pt was tired after walk but no SOB  Stairs            Wheelchair Mobility     Modified Rankin (Stroke Patients Only)       Balance Overall balance assessment: Needs assistance;History of Falls Sitting-balance support: Feet supported Sitting balance-Leahy Scale: Good       Standing balance-Leahy Scale: Poor Standing balance comment: requires B UE support                             Pertinent Vitals/Pain Pain Assessment: No/denies pain    Home Living Family/patient expects to be discharged to:: Skilled nursing facility Living Arrangements: Spouse/significant other;Children Available Help at Discharge: Family;Available 24 hours/day   Home Access: Stairs to enter Entrance Stairs-Rails: Left Entrance Stairs-Number of Steps: 3 Home Layout: One level Home Equipment: Cane - quad Additional Comments: husband is blind, daughter works    Prior Function Level of Independence: Independent with assistive device(s)         Comments: walks with cane, sleeps in recliner, sponge bathes     Hand Dominance   Dominant Hand: Right    Extremity/Trunk Assessment   Upper Extremity Assessment Upper Extremity Assessment: Overall WFL for tasks assessed    Lower Extremity Assessment Lower Extremity Assessment: Generalized weakness    Cervical / Trunk Assessment Cervical / Trunk Assessment: Normal  Communication   Communication: No difficulties  Cognition Arousal/Alertness: Awake/alert Behavior During Therapy: WFL for tasks assessed/performed Overall Cognitive Status: Within Functional Limits for tasks assessed                      General Comments  Exercises     Assessment/Plan    PT Assessment Patient needs continued PT services  PT Problem List Decreased strength;Decreased balance;Decreased activity tolerance;Decreased range of motion;Obesity;Cardiopulmonary status limiting activity;Decreased safety awareness;Decreased knowledge of use of DME;Decreased coordination;Decreased mobility          PT Treatment  Interventions DME instruction;Gait training;Functional mobility training;Therapeutic activities;Therapeutic exercise;Stair training;Balance training;Neuromuscular re-education;Patient/family education    PT Goals (Current goals can be found in the Care Plan section)  Acute Rehab PT Goals Patient Stated Goal: to return home PT Goal Formulation: With patient Time For Goal Achievement: 07/26/16 Potential to Achieve Goals: Good    Frequency Min 3X/week   Barriers to discharge Decreased caregiver support husband is blind    Co-evaluation               End of Session Equipment Utilized During Treatment: Gait belt;Oxygen Activity Tolerance: Patient limited by fatigue Patient left: in chair;with call bell/phone within reach;with family/visitor present;with chair alarm set Nurse Communication: Mobility status         Time: [id number] PT Time Calculation (min) (ACUTE ONLY): 24 min   Charges:   PT Evaluation $PT Eval Moderate Complexity: 1 Procedure PT Treatments $Gait Training: 8-22 mins   PT G Codes:        Ivar DrapeStout, Shaarav Ripple E 07/12/2016, 3:27 PM   Samul Dadauth Georgie Haque, PT MS Acute Rehab Dept. Number: [facility] [phone] and [facility] [phone]

## 2016-07-12 NOTE — Progress Notes (Signed)
Spoke with Dr. Clarnce FlockFudim with fellow for Cardiology to verify that Cardizem gtt was to infuse with Amio gtt with pt's HR maintaining in the 80's. Ordered to wean patient from cardizem gtt but also start the amio gtt.  Viviano SimasMorgan N Faylynn Stamos, RN

## 2016-07-13 ENCOUNTER — Inpatient Hospital Stay (HOSPITAL_COMMUNITY): Payer: Medicare HMO | Admitting: Certified Registered Nurse Anesthetist

## 2016-07-13 ENCOUNTER — Encounter (HOSPITAL_COMMUNITY): Payer: Self-pay | Admitting: Certified Registered Nurse Anesthetist

## 2016-07-13 ENCOUNTER — Encounter (HOSPITAL_COMMUNITY)
Admission: EM | Disposition: A | Discharge status: Home Health | Payer: Self-pay | Source: Home / Self Care | Attending: Internal Medicine

## 2016-07-13 HISTORY — PX: CARDIOVERSION: SHX1299

## 2016-07-13 LAB — CBC
HEMATOCRIT: 29 % — AB (ref 36.0–46.0)
Hemoglobin: 9.3 g/dL — ABNORMAL LOW (ref 12.0–15.0)
MCH: 27.8 pg (ref 26.0–34.0)
MCHC: 32.1 g/dL (ref 30.0–36.0)
MCV: 86.8 fL (ref 78.0–100.0)
PLATELETS: 454 10*3/uL — AB (ref 150–400)
RBC: 3.34 MIL/uL — AB (ref 3.87–5.11)
RDW: 15.9 % — AB (ref 11.5–15.5)
WBC: 19.5 10*3/uL — ABNORMAL HIGH (ref 4.0–10.5)

## 2016-07-13 LAB — BASIC METABOLIC PANEL
Anion gap: 8 (ref 5–15)
BUN: 29 mg/dL — AB (ref 6–20)
CO2: 26 mmol/L (ref 22–32)
Calcium: 8.7 mg/dL — ABNORMAL LOW (ref 8.9–10.3)
Chloride: 101 mmol/L (ref 101–111)
Creatinine, Ser: 1.02 mg/dL — ABNORMAL HIGH (ref 0.44–1.00)
GFR calc Af Amer: >60 mL/min (ref >60)
GFR, EST NON AFRICAN AMERICAN: 53 mL/min — AB (ref >60)
GLUCOSE: 273 mg/dL — AB (ref 65–99)
POTASSIUM: 4.4 mmol/L (ref 3.5–5.1)
Sodium: 135 mmol/L (ref 135–145)

## 2016-07-13 LAB — GLUCOSE, CAPILLARY
GLUCOSE-CAPILLARY: 199 mg/dL — AB (ref 65–99)
GLUCOSE-CAPILLARY: 394 mg/dL — AB (ref 65–99)
Glucose-Capillary: 284 mg/dL — ABNORMAL HIGH (ref 65–99)
Glucose-Capillary: 251 mg/dL — ABNORMAL HIGH (ref 65–99)

## 2016-07-13 SURGERY — CARDIOVERSION
Anesthesia: General

## 2016-07-13 MED ORDER — CARVEDILOL 25 MG PO TABS
25.0000 mg | ORAL_TABLET | Freq: Two times a day (BID) | ORAL | Status: DC
  Administered 2016-07-13 – 2016-07-16 (×7): 25 mg via ORAL
  Filled 2016-07-13 (×7): qty 1

## 2016-07-13 MED ORDER — PROPOFOL 10 MG/ML IV BOLUS
INTRAVENOUS | Status: DC | PRN
  Administered 2016-07-13: 50 mg via INTRAVENOUS

## 2016-07-13 MED ORDER — LIDOCAINE HCL (CARDIAC) 20 MG/ML IV SOLN
INTRAVENOUS | Status: DC | PRN
  Administered 2016-07-13: 20 mg via INTRAVENOUS

## 2016-07-13 MED ORDER — PREDNISONE 20 MG PO TABS
40.0000 mg | ORAL_TABLET | Freq: Every day | ORAL | Status: DC
  Administered 2016-07-14 – 2016-07-16 (×3): 40 mg via ORAL
  Filled 2016-07-13: qty 2
  Filled 2016-07-13: qty 4
  Filled 2016-07-13: qty 2

## 2016-07-13 MED ORDER — INSULIN GLARGINE 100 UNIT/ML ~~LOC~~ SOLN
20.0000 [IU] | Freq: Every day | SUBCUTANEOUS | Status: DC
  Administered 2016-07-13 – 2016-07-16 (×4): 20 [IU] via SUBCUTANEOUS
  Filled 2016-07-13 (×4): qty 0.2

## 2016-07-13 NOTE — Progress Notes (Addendum)
PROGRESS NOTE    Jamie Mason  UJW:[id number] DOB: 03/16/1941 DOA: 07/06/2016 PCP: Blanch MediaBUTCHER,ELIZABETH, MD   Chief Complaint  Patient presents with  . Respiratory Distress    Brief Narrative:  76 yo F with PMH of dCHF, morbid obesity,admitted 1/24 with SOB. 1/24 she was brought in via EMS from her nursing home for respiratory distress. She was found to have sats in the 80's, fever to 102.5 and was placed on CPAP with improvement.  Influenza panel was positive.  She required BIPAP support with improvement.  Pt was transferred out of ICU 1/25.    Continues to be in Afib RVR, started on cardizem drip, amiodarone drip added. Heart cath listed below. Plan for DCCV today, 07/13/16. Assessment & Plan   Acute hypoxic respiratory distress -Secondary to influenza, CHF exacerbation -Patient required BiPAP support upon admission, currently on nasal cannula -Continue supplemental oxygen to maintain saturations above 92% -Continue steroids, nebs, Tamiflu -Transitioned to prednisone, continue to taper  Sepsis secondary to Influenza A -Patient presented with tachycardia, tachypnea, leukocytosis with an elevated lactic acid of 4.49 -Treatment plan as above  NSTEMI/CAD -Cardiology consulted and appreciated -patient has known occlusion of RCA -Continue Coreg, statin, aspirin -Heparin held due to hematuria- will restart heparin today and monitor closely -heart catheterization 07/11/2016: 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 40-60% moderate disease to Distal MCA, mid/distal LAD, ostial LCx, OM2. Elevated LV filling prssure 28-1930mmHg.  Recommended medical therapy, diuresis, management of Afib.  Acute on chronic diastolic heart failure -Appears to be improving -Echocardiogram 06/27/2016 showed an EF of 60-65%, grade 2 diastolic dysfunction -Patient currently appears to be euvolemic -Continue Lasix 40mg  daily PO (given IV lasix 40mg  IV BID on 1/30) -Monitor intake and  output, daily weights -Urine output over past 24hrs 800c -weight down 18lbs (if accurate) -Continue diuresis per cardiology recommendations  AKI on CKD, stage III -Creatinine peaked to 1.95, currently trending downward -Creatinine 1.02 today -Continue to monitor BMP  Paroxsymal Atrial fibrillation -Continue coreg, aspirin  -Heparin held due to hematuria- restarted heparin- transitioned to Eliquis -Patient HR was in the 130s yesterday. Coreg increased but ineffective. Placed on cardizem drip -Continue cardizem/amiodarone added -Plan for DCCV today  Hematuria -Possibly secondary to heparin drip -hemoglobin 9.3  ?UTI -Urine culture >100K GPC- VRE -Repeat UA/culture pending not convincing of infection and patient denies any dysuria-  Repeat UA unremarkable/Urine culture shows no growth -Continue macrobid  Normocytic anemia -Hemoglobin currently 9.3 -Continue to monitor CBC  Diabetes mellitus, type II -Metformin held -Continue ISS and CBG monitoring -Will add on lantus   Deconditioning -PT and OT consulted, rec SNF  Stage III pressure ulcers -POA- ischial tuberosities L>R, right posterior thigh -Wound care consulted, continue recommenations  DVT Prophylaxis  Eliquis  Code Status: Full  Family Communication: None at bedside  Disposition Plan: Admitted. Wean off of cardizem drip if able. SNF when medically stable  Consultants Cardiology PCCM  Procedures  Heart catheterization  Antibiotics   Anti-infectives    Start     Dose/Rate Route Frequency Ordered Stop   07/08/16 0400  vancomycin (VANCOCIN) 1,750 mg in sodium chloride 0.9 % 500 mL IVPB  Status:  Discontinued     1,750 mg 250 mL/hr over 120 Minutes Intravenous Every 48 hours 07/06/16 0409 07/06/16 1011   07/08/16 0000  levofloxacin (LEVAQUIN) IVPB 750 mg  Status:  Discontinued     750 mg 100 mL/hr over 90 Minutes Intravenous Every 48 hours 07/06/16 0409 07/06/16 0413  07/06/16 1400  aztreonam (AZACTAM)  1 g in dextrose 5 % 50 mL IVPB  Status:  Discontinued     1 g 100 mL/hr over 30 Minutes Intravenous Every 8 hours 07/06/16 0409 07/07/16 0848   07/06/16 1000  oseltamivir (TAMIFLU) capsule 30 mg  Status:  Discontinued     30 mg Oral Daily 07/06/16 0347 07/06/16 0424   07/06/16 1000  oseltamivir (TAMIFLU) capsule 30 mg     30 mg Oral 2 times daily 07/06/16 0424 07/10/16 2210   07/06/16 0230  vancomycin (VANCOCIN) 1,500 mg in sodium chloride 0.9 % 500 mL IVPB     1,500 mg 250 mL/hr over 120 Minutes Intravenous  Once 07/06/16 0202 07/06/16 0834   07/06/16 0200  levofloxacin (LEVAQUIN) IVPB 750 mg     750 mg 100 mL/hr over 90 Minutes Intravenous  Once 07/06/16 0155 07/06/16 0444   07/06/16 0200  aztreonam (AZACTAM) 2 g in dextrose 5 % 50 mL IVPB     2 g 100 mL/hr over 30 Minutes Intravenous  Once 07/06/16 0155 07/06/16 0512   07/06/16 0200  vancomycin (VANCOCIN) IVPB 1000 mg/200 mL premix  Status:  Discontinued     1,000 mg 200 mL/hr over 60 Minutes Intravenous  Once 07/06/16 0155 07/06/16 0200      Subjective:   Jamie Mason seen and examined today.  Patient states she is feeling better.  Feels breathing has improved. Has occasional cough. Denies chest pain, abdominal pain, nausea or vomiting, dizziness or headache.   Objective:   Vitals:   07/12/16 2222 07/13/16 0401 07/13/16 0632 07/13/16 0800  BP: (!) 146/62 (!) 158/60 (!) 149/105   Pulse:  (!) 123 (!) 106   Resp: 13 19    Temp: 98.1 F (36.7 C) 97.6 F (36.4 C)  98 F (36.7 C)  TempSrc: Oral Oral  Oral  SpO2: 99% 98%    Weight:  (!) 138.3 kg (305 lb)    Height:        Intake/Output Summary (Last 24 hours) at 07/13/16 1122 Last data filed at 07/13/16 0700  Gross per 24 hour  Intake          1569.31 ml  Output              750 ml  Net           819.31 ml   Filed Weights   07/11/16 0516 07/12/16 0502 07/13/16 0401  Weight: (!) 136.5 kg (300 lb 14.4 oz) (!) 138.3 kg (304 lb 12.8 oz) (!) 138.3 kg (305 lb)     Exam  General: Well developed, well nourished, NAD  HEENT: NCAT,mucous membranes moist.   Cardiovascular: S1 S2 auscultated, Irregular, 1/6SEM  Respiratory: Clear, occ cough (nonproductive)  Abdomen: Soft, obese, nontender, nondistended, + bowel sounds  Extremities: warm dry without cyanosis clubbing or edema  Neuro: AAOx3, nonfocal  Psych: Normal affect and demeanor, pleasant   Data Reviewed: I have personally reviewed following labs and imaging studies  CBC:  Recent Labs Lab 07/07/16 0921  07/09/16 0419 07/10/16 0502 07/11/16 0254 07/12/16 0539 07/13/16 0541  WBC 24.2*  < > 18.4* 17.3* 19.8* 18.5* 19.5*  NEUTROABS 20.9*  --   --   --   --   --   --   HGB 9.3*  < > 9.6* 9.2* 9.1* 9.3* 9.3*  HCT 28.9*  < > 30.4* 29.4* 28.8* 29.2* 29.0*  MCV 87.8  < > 88.4 87.8 88.1 88.2 86.8  PLT 399  < >  461* 428* 432* 422* 454*  < > = values in this interval not displayed. Basic Metabolic Panel:  Recent Labs Lab 07/09/16 0419 07/10/16 0502 07/11/16 0254 07/12/16 1252 07/13/16 0541  NA 138 138 138 135 135  K 5.0 4.3 4.4 4.7 4.4  CL 97* 101 102 102 101  CO2 30 29 29 26 26   GLUCOSE 204* 250* 272* 393* 273*  BUN 40* 33* 30* 29* 29*  CREATININE 1.28* 1.06* 1.13* 1.08* 1.02*  CALCIUM 8.6* 8.6* 8.8* 8.8* 8.7*   GFR: Estimated Creatinine Clearance: 69.4 mL/min (by C-G formula based on SCr of 1.02 mg/dL (H)). Liver Function Tests:  Recent Labs Lab 07/07/16 0921 07/08/16 0424  AST 99* 60*  ALT 45 39  ALKPHOS 52 53  BILITOT 0.6 0.8  PROT 7.5 7.4  ALBUMIN 2.5* 2.3*   No results for input(s): LIPASE, AMYLASE in the last 168 hours. No results for input(s): AMMONIA in the last 168 hours. Coagulation Profile:  Recent Labs Lab 07/11/16 0929  INR 1.14   Cardiac Enzymes:  Recent Labs Lab 07/06/16 1427  TROPONINI 1.93*   BNP (last 3 results) No results for input(s): PROBNP in the last 8760 hours. HbA1C: No results for input(s): HGBA1C in the last 72  hours. CBG:  Recent Labs Lab 07/12/16 1150 07/12/16 1700 07/12/16 2327 07/13/16 0627 07/13/16 0750  GLUCAP 359* 347* 330* 284* 251*   Lipid Profile: No results for input(s): CHOL, HDL, LDLCALC, TRIG, CHOLHDL, LDLDIRECT in the last 72 hours. Thyroid Function Tests: No results for input(s): TSH, T4TOTAL, FREET4, T3FREE, THYROIDAB in the last 72 hours. Anemia Panel: No results for input(s): VITAMINB12, FOLATE, FERRITIN, TIBC, IRON, RETICCTPCT in the last 72 hours. Urine analysis:    Component Value Date/Time   COLORURINE YELLOW 07/10/2016 1021   APPEARANCEUR CLEAR 07/10/2016 1021   LABSPEC 1.018 07/10/2016 1021   PHURINE 6.0 07/10/2016 1021   GLUCOSEU 50 (A) 07/10/2016 1021   HGBUR SMALL (A) 07/10/2016 1021   BILIRUBINUR NEGATIVE 07/10/2016 1021   BILIRUBINUR Negative 03/19/2014 1631   KETONESUR NEGATIVE 07/10/2016 1021   PROTEINUR NEGATIVE 07/10/2016 1021   UROBILINOGEN 0.2 03/19/2014 1631   NITRITE NEGATIVE 07/10/2016 1021   LEUKOCYTESUR SMALL (A) 07/10/2016 1021   Sepsis Labs: @LABRCNTIP (procalcitonin:4,lacticidven:4)  ) Recent Results (from the past 240 hour(s))  Respiratory Panel by PCR     Status: Abnormal   Collection Time: 07/06/16  1:00 AM  Result Value Ref Range Status   Adenovirus NOT DETECTED NOT DETECTED Final   Coronavirus 229E NOT DETECTED NOT DETECTED Final   Coronavirus HKU1 NOT DETECTED NOT DETECTED Final   Coronavirus NL63 NOT DETECTED NOT DETECTED Final   Coronavirus OC43 NOT DETECTED NOT DETECTED Final   Metapneumovirus NOT DETECTED NOT DETECTED Final   Rhinovirus / Enterovirus NOT DETECTED NOT DETECTED Final   Influenza A H3 DETECTED (A) NOT DETECTED Final   Influenza B NOT DETECTED NOT DETECTED Final   Parainfluenza Virus 1 NOT DETECTED NOT DETECTED Final   Parainfluenza Virus 2 NOT DETECTED NOT DETECTED Final   Parainfluenza Virus 3 NOT DETECTED NOT DETECTED Final   Parainfluenza Virus 4 NOT DETECTED NOT DETECTED Final   Respiratory  Syncytial Virus NOT DETECTED NOT DETECTED Final   Bordetella pertussis NOT DETECTED NOT DETECTED Final   Chlamydophila pneumoniae NOT DETECTED NOT DETECTED Final   Mycoplasma pneumoniae NOT DETECTED NOT DETECTED Final  Blood Culture (routine x 2)     Status: None   Collection Time: 07/06/16  1:05 AM  Result  Value Ref Range Status   Specimen Description BLOOD LEFT ARM  Final   Special Requests BOTTLES DRAWN AEROBIC AND ANAEROBIC  Final   Culture NO GROWTH 5 DAYS  Final   Report Status 07/11/2016 FINAL  Final  Blood Culture (routine x 2)     Status: None   Collection Time: 07/06/16  1:35 AM  Result Value Ref Range Status   Specimen Description BLOOD RIGHT HAND  Final   Special Requests BOTTLES DRAWN AEROBIC AND ANAEROBIC  Final   Culture NO GROWTH 5 DAYS  Final   Report Status 07/11/2016 FINAL  Final  MRSA PCR Screening     Status: None   Collection Time: 07/06/16  6:47 AM  Result Value Ref Range Status   MRSA by PCR NEGATIVE NEGATIVE Final    Comment:        The GeneXpert MRSA Assay (FDA approved for NASAL specimens only), is one component of a comprehensive MRSA colonization surveillance program. It is not intended to diagnose MRSA infection nor to guide or monitor treatment for MRSA infections.   Urine culture     Status: Abnormal   Collection Time: 07/06/16 10:40 AM  Result Value Ref Range Status   Specimen Description URINE, RANDOM  Final   Special Requests NONE  Final   Culture (A)  Final    >=100,000 COLONIES/mL VANCOMYCIN RESISTANT ENTEROCOCCUS   Report Status 07/10/2016 FINAL  Final   Organism ID, Bacteria VANCOMYCIN RESISTANT ENTEROCOCCUS (A)  Final      Susceptibility   Vancomycin resistant enterococcus - MIC*    AMPICILLIN <=2 SENSITIVE Sensitive     LEVOFLOXACIN >=8 RESISTANT Resistant     NITROFURANTOIN <=16 SENSITIVE Sensitive     VANCOMYCIN >=32 RESISTANT Resistant     LINEZOLID 2 SENSITIVE Sensitive     * >=100,000 COLONIES/mL VANCOMYCIN  RESISTANT ENTEROCOCCUS  Culture, Urine     Status: None   Collection Time: 07/10/16 10:21 AM  Result Value Ref Range Status   Specimen Description URINE, RANDOM  Final   Special Requests NONE  Final   Culture NO GROWTH  Final   Report Status 07/11/2016 FINAL  Final      Radiology Studies: No results found.   Scheduled Meds: . apixaban  5 mg Oral BID  . aspirin EC  81 mg Oral Daily  . atorvastatin  40 mg Oral q1800  . carvedilol  25 mg Oral BID WC  . chlorhexidine  15 mL Mouth Rinse BID  . furosemide  40 mg Oral Daily  . insulin aspart  0-20 Units Subcutaneous TID WC  . insulin aspart  0-5 Units Subcutaneous QHS  . mouth rinse  15 mL Mouth Rinse q12n4p  . methylPREDNISolone (SOLU-MEDROL) injection  40 mg Intravenous Daily  . nitrofurantoin (macrocrystal-monohydrate)  100 mg Oral Q12H  . senna-docusate  1 tablet Oral BID  . sodium chloride flush  3 mL Intravenous Q12H  . sodium chloride flush  3 mL Intravenous Q12H   Continuous Infusions: . sodium chloride 20 mL/hr at 07/12/16 1848  . amiodarone 30 mg/hr (07/13/16 0322)  . dilTIAZem HCl-Dextrose Stopped (07/13/16 0410)     LOS: 7 days   Time Spent in minutes   30 minutes  Arloa Prak D.O. on 07/13/2016 at 11:22 AM  Between 7am to 7pm - Pager - [phone]  After 7pm go to [email or web address] - password [email or web address]  And look for the night coverage person covering for me after hours  [facility] Office  [phone]  

## 2016-07-13 NOTE — Transfer of Care (Signed)
Immediate Anesthesia Transfer of Care Note  Patient: Jamie Mason  Procedure(s) Performed: Procedure(s): CARDIOVERSION (N/A)  Patient Location: PACU  Anesthesia Type:General  Level of Consciousness: awake, alert , oriented and patient cooperative  Airway & Oxygen Therapy: Patient Spontanous Breathing and Patient connected to nasal cannula oxygen  Post-op Assessment: Report given to RN and Post -op Vital signs reviewed and stable  Post vital signs: Reviewed and stable  Last Vitals:  Vitals:   07/13/16 0800 07/13/16 1323  BP:  (!) 145/89  Pulse:  (!) 125  Resp:  15  Temp: 36.7 C 36.8 C    Last Pain:  Vitals:   07/13/16 1323  TempSrc: Oral  PainSc:          Complications: No apparent anesthesia complications

## 2016-07-13 NOTE — Care Management Important Message (Signed)
Important Message  Patient Details  Name: Jamie Mason MRN: [id number] Date of Birth: 12/25/1940   Medicare Important Message Given:  Yes    Gala LewandowskyGraves-Bigelow, Aizen Duval Kaye, RN 07/13/2016, 4:39 PM

## 2016-07-13 NOTE — Progress Notes (Signed)
Patient Name: Jamie NighGloria C Nunziato Date of Encounter: 07/13/2016  [facility]  Hospital Problem List     Principal Problem:   Acute on chronic respiratory failure [facility](HCC) Active Problems:   Elevated troponin   Hypoxia   Pressure injury of skin   Flu   Atrial flutter with rapid ventricular response (HCC)   Non-ST elevation (NSTEMI) myocardial infarction [facility](HCC)   Subjective   The patient is sleeping.   Inpatient Medications    Scheduled Meds: . apixaban  5 mg Oral BID  . aspirin EC  81 mg Oral Daily  . atorvastatin  40 mg Oral q1800  . carvedilol  12.5 mg Oral BID WC  . chlorhexidine  15 mL Mouth Rinse BID  . furosemide  40 mg Oral Daily  . insulin aspart  0-20 Units Subcutaneous TID WC  . insulin aspart  0-5 Units Subcutaneous QHS  . mouth rinse  15 mL Mouth Rinse q12n4p  . methylPREDNISolone (SOLU-MEDROL) injection  40 mg Intravenous Daily  . nitrofurantoin (macrocrystal-monohydrate)  100 mg Oral Q12H  . senna-docusate  1 tablet Oral BID  . sodium chloride flush  3 mL Intravenous Q12H  . sodium chloride flush  3 mL Intravenous Q12H   Continuous Infusions: . sodium chloride 20 mL/hr at 07/12/16 1848  . amiodarone 30 mg/hr (07/13/16 0322)  . dilTIAZem HCl-Dextrose Stopped (07/13/16 0410)   PRN Meds: sodium chloride, sodium chloride, sodium chloride, acetaminophen, albuterol, ondansetron (ZOFRAN) IV, sodium chloride flush, sodium chloride flush   Vital Signs    Vitals:   07/12/16 2041 07/12/16 2222 07/13/16 0401 07/13/16 0632  BP: (!) 151/99 (!) 146/62 (!) 158/60 (!) 149/105  Pulse: 63  (!) 123 (!) 106  Resp: 20 13 19    Temp: 99 F (37.2 C) 98.1 F (36.7 C) 97.6 F (36.4 C)   TempSrc: Oral Oral Oral   SpO2: 100% 99% 98%   Weight:   (!) 305 lb (138.3 kg)   Height:        Intake/Output Summary (Last 24 hours) at 07/13/16 1017 Last data filed at 07/13/16 0700  Gross per 24 hour  Intake          1569.31 ml  Output              750 ml  Net            819.31 ml   Filed Weights   07/11/16 0516 07/12/16 0502 07/13/16 0401  Weight: (!) 300 lb 14.4 oz (136.5 kg) (!) 304 lb 12.8 oz (138.3 kg) (!) 305 lb (138.3 kg)    Physical Exam    GEN: Well nourished, well developed, in no acute distress.  HEENT: Grossly normal.  Neck: Supple, no JVD, carotid bruits, or masses. Cardiac: Irreg R&R, soft murmur, no rubs, or gallops. No clubbing, cyanosis, edema.  Radials/DP/PT 2+ and equal bilaterally.  Respiratory:  Respirations regular and unlabored, decreased BS bases w/ few rales bilaterally. GI: Soft, nontender, nondistended, BS + x 4. MS: no deformity or atrophy. Skin: warm and dry, no rash. Neuro:  Strength and sensation are intact. Psych: AAOx3.  Normal affect.  Labs    CBC  Recent Labs  07/12/16 0539 07/13/16 0541  WBC 18.5* 19.5*  HGB 9.3* 9.3*  HCT 29.2* 29.0*  MCV 88.2 86.8  PLT 422* 454*   Basic Metabolic Panel  Recent Labs  07/12/16 1252 07/13/16 0541  NA 135 135  K 4.7 4.4  CL 102 101  CO2 26  26  GLUCOSE 393* 273*  BUN 29* 29*  CREATININE 1.08* 1.02*  CALCIUM 8.8* 8.7*   Liver Function Tests Lab Results  Component Value Date   ALT 39 07/08/2016   AST 60 (H) 07/08/2016   ALKPHOS 53 07/08/2016   BILITOT 0.8 07/08/2016   Cardiac Enzymes Lab Results  Component Value Date   TROPONINI 1.93 (HH) 07/06/2016   BNP   Recent Labs  06/27/16 1717 07/06/16 0106  BNP 1,283.8* 731.2*   Hemoglobin A1C Lab Results  Component Value Date   HGBA1C 7.3 (H) 06/27/2016   Fasting Lipid Panel Lab Results  Component Value Date   CHOL 170 03/19/2014   HDL 26 (L) 03/19/2014   LDLCALC 128 (H) 03/19/2014   TRIG 79 03/19/2014   CHOLHDL 6.5 03/19/2014   Thyroid Function Tests Lab Results  Component Value Date   TSH 1.615 03/19/2014     Telemetry    Atrial fib, RVR at times - Personally Reviewed  ECG    n/a - Personally Reviewed  Radiology    No results found.  Cardiac Studies   ECHO:  01/15 - Left ventricle: LVEF is normal at 60 to 65% with distal inferior   hypokinesis. The cavity size was normal. Wall thickness was   increased in a pattern of moderate LVH. Systolic function was   normal. The estimated ejection fraction was in the range of 60%   to 65%. Features are consistent with a pseudonormal left   ventricular filling pattern, with concomitant abnormal relaxation   and increased filling pressure (grade 2 diastolic dysfunction).   Doppler parameters are consistent with high ventricular filling pressure. - Mitral valve: There was mild regurgitation. - Pulmonary arteries: PA peak pressure: 47 mm Hg (S). - Left atrium:  The atrium was normal in size. - Right atrium:  The atrium was normal in size.  Patient Profile     76 y.o. female with a history of RCA 100%, Hypertensive heart disease, hyperlipidemia, diabetes, lumbar disc problem, morbid obesity, former smoker. Admit 01/14-01/18 with LE wounds. Admit 01/25 w/ SOB, atrial fib RVR.   Assessment & Plan     1. Acute on chronic respiratory failure (HCC) - resp status improved, - per IM  2. Demand ischemia with  Elevated troponin Cath on 07/11/16: 1. 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 2. Moderate disease (40-60%) involving the distal LMCA, mid and distal LAD, ostial LCx, and OM2. 3. Moderately elevated left ventricular filling pressure (LVEDP 28-30 mmHg). Recommendations: 1. Medical therapy of elevated troponin likely due to supply-demand mismatch. 2. Aggressive secondary prevention and management of atrial fibrillation with rapid ventricular response. 3. Escalate diuresis beginning tomorrow. Would try to maintain net even to slightly negative fluid balance overnight given contrast load and recent acute kidney injury. - continue ASA, BB, high dost statin,    3.  Flu - PCR positive - per IM  4. Atrial flutter with rapid ventricular response (HCC) - HR improved but not  controlled on IV Cardizem at 15 mg/hr and Coreg 12.5 mg bid, started on iv amiodarone yesterday as HR up to 130' - increase carvedilol to 25 mg po BID - both atrial were normal in size on recent echo - feel she would benefit from SR but with acute illness on steroids and nebs, this will be difficult - MD to review and advise if anti-arrhythmic needed. - scheduled for a DCCV for today   5. Acute diastolic CHF - change lasix to 40  mg iv Q12 H x 2 doses,we will continue, crea is stable.  6. Hypertension - adjust meds post DCCV   Tobias Alexander, MD 07/13/2016

## 2016-07-13 NOTE — CV Procedure (Signed)
[facility] Noreene LarssonJoslin 50 mg propofol and 20 mg lidocaine  DCC x 1 200 J  Converted from flutter rate 124 to NSR with PAC rate 74  On Eliquis No immediate neurologic sequelae  Will continue iv amiodarone over night  Jamie HawsPeter Zion Mason

## 2016-07-13 NOTE — Anesthesia Preprocedure Evaluation (Signed)
Anesthesia Evaluation  Patient identified by MRN, date of birth, ID band Patient awake    Reviewed: Allergy & Precautions, NPO status , Patient's Chart, lab work & pertinent test results  Airway Mallampati: II  TM Distance: >3 FB Neck ROM: Full    Dental  (+) Teeth Intact, Dental Advisory Given   Pulmonary former smoker,    breath sounds clear to auscultation       Cardiovascular hypertension,  Rhythm:Regular Rate:Normal     Neuro/Psych    GI/Hepatic   Endo/Other  diabetes  Renal/GU      Musculoskeletal   Abdominal (+) + obese,   Peds  Hematology   Anesthesia Other Findings   Reproductive/Obstetrics                             Anesthesia Physical Anesthesia Plan  ASA: III  Anesthesia Plan: General   Post-op Pain Management:    Induction: Intravenous  Airway Management Planned: Mask  Additional Equipment:   Intra-op Plan:   Post-operative Plan:   Informed Consent:   Plan Discussed with: CRNA and Anesthesiologist  Anesthesia Plan Comments:         Anesthesia Quick Evaluation

## 2016-07-13 NOTE — Anesthesia Postprocedure Evaluation (Signed)
Anesthesia Post Note  Patient: Jamie Mason  Procedure(s) Performed: Procedure(s) (LRB): CARDIOVERSION (N/A)  Patient location during evaluation: Endoscopy Anesthesia Type: General Level of consciousness: awake, awake and alert and oriented Pain management: pain level controlled Vital Signs Assessment: post-procedure vital signs reviewed and stable Respiratory status: spontaneous breathing, nonlabored ventilation and respiratory function stable Cardiovascular status: blood pressure returned to baseline Postop Assessment: no headache Anesthetic complications: no       Last Vitals:  Vitals:   07/13/16 1425 07/13/16 1600  BP: (!) 134/47 (!) 147/80  Pulse: 60 73  Resp: 17 (!) 73  Temp:  36.3 C    Last Pain:  Vitals:   07/13/16 1600  TempSrc: Oral  PainSc:                  Nyle Limb COKER

## 2016-07-13 NOTE — Progress Notes (Signed)
Inpatient Diabetes Program Recommendations  AACE/ADA: New Consensus Statement on Inpatient Glycemic Control (2015)  Target Ranges:  Prepandial:   less than 140 mg/dL      Peak postprandial:   less than 180 mg/dL (1-2 hours)      Critically ill patients:  140 - 180 mg/dL   Results for [name], Jamie Mason (MRN [id number]) as of 07/13/2016 11:24  Ref. Range 07/12/2016 05:38 07/12/2016 11:50 07/12/2016 17:00 07/12/2016 23:27 07/13/2016 06:27 07/13/2016 07:50  Glucose-Capillary Latest Ref Range: 65 - 99 mg/dL 213357 (H) 086359 (H) 578347 (H) 330 (H) 284 (H) 251 (H)   Review of Glycemic Control  Diabetes history: DM 2 Outpatient Diabetes medications: Metformin 500 mg BID Current orders for Inpatient glycemic control: Novolog Resistant TID + HS scale  Inpatient Diabetes Program Recommendations:   Glucose in the 200-300 range. While on IV Solumedrol 40 mg, oral meds are being held, and patient's weight, please consider adding basal of Lantus 20 units daily.   Thanks,  Christena DeemShannon Johnetta Sloniker RN, MSN, [facility] Inpatient Diabetes Coordinator Team Pager [phone] (8a-5p)

## 2016-07-14 ENCOUNTER — Ambulatory Visit: Payer: Commercial Managed Care - HMO | Admitting: Internal Medicine

## 2016-07-14 DIAGNOSIS — J9620 Acute and chronic respiratory failure, unspecified whether with hypoxia or hypercapnia: Secondary | ICD-10-CM

## 2016-07-14 LAB — BASIC METABOLIC PANEL
Anion gap: 7 (ref 5–15)
BUN: 30 mg/dL — ABNORMAL HIGH (ref 6–20)
CALCIUM: 8.6 mg/dL — AB (ref 8.9–10.3)
CO2: 32 mmol/L (ref 22–32)
CREATININE: 1.12 mg/dL — AB (ref 0.44–1.00)
Chloride: 99 mmol/L — ABNORMAL LOW (ref 101–111)
GFR calc non Af Amer: 48 mL/min — ABNORMAL LOW (ref >60)
GFR, EST AFRICAN AMERICAN: 55 mL/min — AB (ref >60)
GLUCOSE: 313 mg/dL — AB (ref 65–99)
Potassium: 4.4 mmol/L (ref 3.5–5.1)
Sodium: 138 mmol/L (ref 135–145)

## 2016-07-14 LAB — CBC
HEMATOCRIT: 29.4 % — AB (ref 36.0–46.0)
Hemoglobin: 9.5 g/dL — ABNORMAL LOW (ref 12.0–15.0)
MCH: 28.3 pg (ref 26.0–34.0)
MCHC: 32.3 g/dL (ref 30.0–36.0)
MCV: 87.5 fL (ref 78.0–100.0)
PLATELETS: 435 10*3/uL — AB (ref 150–400)
RBC: 3.36 MIL/uL — ABNORMAL LOW (ref 3.87–5.11)
RDW: 16.2 % — ABNORMAL HIGH (ref 11.5–15.5)
WBC: 14.7 10*3/uL — ABNORMAL HIGH (ref 4.0–10.5)

## 2016-07-14 LAB — GLUCOSE, CAPILLARY
GLUCOSE-CAPILLARY: 307 mg/dL — AB (ref 65–99)
GLUCOSE-CAPILLARY: 285 mg/dL — AB (ref 65–99)
Glucose-Capillary: 392 mg/dL — ABNORMAL HIGH (ref 65–99)
Glucose-Capillary: 230 mg/dL — ABNORMAL HIGH (ref 65–99)
Glucose-Capillary: 187 mg/dL — ABNORMAL HIGH (ref 65–99)
Glucose-Capillary: 175 mg/dL — ABNORMAL HIGH (ref 65–99)
Glucose-Capillary: 214 mg/dL — ABNORMAL HIGH (ref 65–99)

## 2016-07-14 MED ORDER — AMLODIPINE BESYLATE 2.5 MG PO TABS
2.5000 mg | ORAL_TABLET | Freq: Every day | ORAL | Status: DC
  Administered 2016-07-14 – 2016-07-16 (×3): 2.5 mg via ORAL
  Filled 2016-07-14 (×3): qty 1

## 2016-07-14 NOTE — Progress Notes (Signed)
Physical Therapy Treatment Patient Details Name: Jamie NighGloria C Hoerner MRN: [id number] DOB: 06/28/1940 Today's Date: 07/14/2016    History of Present Illness Pt came from SNF in which she was admitted for rehab after recent hospitalization with acute on chronic respiratory failure, flu and NSTEMI. Pt underwent cardiac catherization 1/29. PMH: CAD, CHF, CKD, morbid obesity, DM, HTN LDD, chronic LE wounds.     PT Comments    Pt making gradual progress with PT. Pt able to ambulate 10 ft with rw and min guard assistance. Pt does fatigue quickly but O2 sats staying in 90s on RA throughout session. Continuing to recommend SNF following acute stay.   Follow Up Recommendations  SNF     Equipment Recommendations  None recommended by PT    Recommendations for Other Services       Precautions / Restrictions Precautions Precautions: Fall Precaution Comments: Larey SeatFell from a chair several weeks ago Restrictions Weight Bearing Restrictions: No    Mobility  Bed Mobility Overal bed mobility: Needs Assistance Bed Mobility: Supine to Sit     Supine to sit: Min guard     General bed mobility comments: HOB elevated approx 30 degrees, using rail to assist.   Transfers Overall transfer level: Needs assistance Equipment used: Rolling walker (2 wheeled) Transfers: Sit to/from Stand Sit to Stand: Min guard         General transfer comment: cues for hand placement, needing additional time but able to complete with min guard assist. Repeated from EOB and chair.   Ambulation/Gait Ambulation/Gait assistance: Min guard Ambulation Distance (Feet): 10 Feet (plus 5 ft) Assistive device: Rolling walker (2 wheeled) Gait Pattern/deviations: Step-through pattern;Trunk flexed Gait velocity: decresed   General Gait Details: cues for posture as tolerated. Taking seated break between ambulation attempts. Pt ambulating on RA, sats staying in 90s throughout.    Stairs            Wheelchair Mobility     Modified Rankin (Stroke Patients Only)       Balance Overall balance assessment: Needs assistance;History of Falls Sitting-balance support: Feet supported Sitting balance-Leahy Scale: Good     Standing balance support: Bilateral upper extremity supported Standing balance-Leahy Scale: Poor Standing balance comment: using rw                    Cognition Arousal/Alertness: Awake/alert Behavior During Therapy: WFL for tasks assessed/performed Overall Cognitive Status: Within Functional Limits for tasks assessed                      Exercises      General Comments        Pertinent Vitals/Pain Pain Assessment: No/denies pain    Home Living                      Prior Function            PT Goals (current goals can now be found in the care plan section) Acute Rehab PT Goals Patient Stated Goal: to return home PT Goal Formulation: With patient Time For Goal Achievement: 07/26/16 Potential to Achieve Goals: Good Progress towards PT goals: Progressing toward goals    Frequency    Min 3X/week      PT Plan Current plan remains appropriate    Co-evaluation             End of Session Equipment Utilized During Treatment: Gait belt (O2 on after session) Activity Tolerance: Patient tolerated treatment well  Patient left: in chair;with call bell/phone within reach     Time: 1033-1100 PT Time Calculation (min) (ACUTE ONLY): 27 min  Charges:  $Gait Training: 8-22 mins $Therapeutic Activity: 8-22 mins                    G Codes:      Christiane Ha, PT, CSCS Pager [phone] Office [phone]  07/14/2016, 1:24 PM

## 2016-07-14 NOTE — Progress Notes (Signed)
PROGRESS NOTE    Jamie Mason  ZOX:[id number] DOB: [birth year] DOA: 07/06/2016  PCP: Blanch Media, MD    Brief Narrative:  76 yo F with PMH of dCHF, morbid obesity,admitted 1/24 with SOB. 1/24 she was brought in via EMS from her nursing home for respiratory distress. She was found to have sats in the 80's, fever to 102.5 and was placed on CPAP with improvement.  Influenza panel was positive.  She required BIPAP support with improvement.  Pt was transferred out of ICU 1/25.    Continues to be in Afib RVR, started on cardizem drip, amiodarone drip added. Heart cath listed below. Plan for DCCV today, 07/13/16. Assessment & Plan   Acute hypoxic respiratory distress -Secondary to influenza, CHF exacerbation -Patient required BiPAP support upon admission, currently on nasal cannula -Continue supplemental oxygen to maintain saturations above 92% -Continue steroids, nebs, Tamiflu -Transitioned to prednisone, continue to taper  Sepsis secondary to Influenza A -Patient presented with tachycardia, tachypnea, leukocytosis with an elevated lactic acid of 4.49 -Treatment plan as above  NSTEMI/CAD -Cardiology consulted and appreciated -patient has known occlusion of RCA -Continue Coreg, statin, aspirin -Heparin held due to hematuria- will restart heparin today and monitor closely -heart catheterization 07/11/2016: 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 40-60% moderate disease to Distal MCA, mid/distal LAD, ostial LCx, OM2. Elevated LV filling prssure 28-23mmHg.  Recommended medical therapy, diuresis, management of Afib.  Acute on chronic diastolic heart failure -Appears to be improving -Echocardiogram 06/27/2016 showed an EF of 60-65%, grade 2 diastolic dysfunction -Patient currently appears to be euvolemic -Continue Lasix 40mg  daily PO (given IV lasix 40mg  IV BID on 1/30) -Monitor intake and output, daily weights -Urine output over past 24hrs 800c - cardiology  team following, assistance appreciated   AKI on CKD, stage III -Creatinine peaked to 1.95, currently trending downward -Creatinine 1.02 today -Continue to monitor BMP  Paroxsymal Atrial fibrillation -Continue coreg, aspirin  -Heparin held due to hematuria- restarted heparin- transitioned to Eliquis -Patient HR was in the 130s yesterday. Coreg increased but ineffective. Placed on cardizem drip -Continue cardizem/amiodarone per cardiology   Hematuria -Possibly secondary to heparin drip -monitor for now  ?UTI -Urine culture >100K GPC- VRE -Repeat UA/culture pending not convincing of infection and patient denies any dysuria-  Repeat UA unremarkable/Urine culture shows no growth -Continue macrobid  Normocytic anemia -Hemoglobin currently 9.3 -Continue to monitor CBC  Diabetes mellitus, type II -Metformin held -Continue ISS and CBG monitoring -Will add on lantus   Deconditioning -PT and OT consulted, rec SNF  Stage III pressure ulcers -POA- ischial tuberosities L>R, right posterior thigh -Wound care consulted, continue recommenations  DVT Prophylaxis  Eliquis  Code Status: Full  Family Communication: None at bedside  Disposition Plan: Admitted. Wean off of cardizem drip if able. SNF when medically stable  Consultants Cardiology PCCM  Procedures  Heart catheterization  Antibiotics   Anti-infectives    Start     Dose/Rate Route Frequency Ordered Stop   07/08/16 0400  vancomycin (VANCOCIN) 1,750 mg in sodium chloride 0.9 % 500 mL IVPB  Status:  Discontinued     1,750 mg 250 mL/hr over 120 Minutes Intravenous Every 48 hours 07/06/16 0409 07/06/16 1011   07/08/16 0000  levofloxacin (LEVAQUIN) IVPB 750 mg  Status:  Discontinued     750 mg 100 mL/hr over 90 Minutes Intravenous Every 48 hours 07/06/16 0409 07/06/16 0413   07/06/16 1400  aztreonam (AZACTAM) 1 g in dextrose 5 % 50 mL  IVPB  Status:  Discontinued     1 g 100 mL/hr over 30 Minutes Intravenous Every 8  hours 07/06/16 0409 07/07/16 0848   07/06/16 1000  oseltamivir (TAMIFLU) capsule 30 mg  Status:  Discontinued     30 mg Oral Daily 07/06/16 0347 07/06/16 0424   07/06/16 1000  oseltamivir (TAMIFLU) capsule 30 mg     30 mg Oral 2 times daily 07/06/16 0424 07/10/16 2210   07/06/16 0230  vancomycin (VANCOCIN) 1,500 mg in sodium chloride 0.9 % 500 mL IVPB     1,500 mg 250 mL/hr over 120 Minutes Intravenous  Once 07/06/16 0202 07/06/16 0834   07/06/16 0200  levofloxacin (LEVAQUIN) IVPB 750 mg     750 mg 100 mL/hr over 90 Minutes Intravenous  Once 07/06/16 0155 07/06/16 0444   07/06/16 0200  aztreonam (AZACTAM) 2 g in dextrose 5 % 50 mL IVPB     2 g 100 mL/hr over 30 Minutes Intravenous  Once 07/06/16 0155 07/06/16 0512   07/06/16 0200  vancomycin (VANCOCIN) IVPB 1000 mg/200 mL premix  Status:  Discontinued     1,000 mg 200 mL/hr over 60 Minutes Intravenous  Once 07/06/16 0155 07/06/16 0200      Subjective:   Jamie Mason seen and examined today.  Patient states she is feeling better.  Feels breathing has improved. Has occasional cough. Denies chest pain, abdominal pain, nausea or vomiting, dizziness or headache.   Objective:   Vitals:   07/14/16 1227 07/14/16 1330 07/14/16 1630 07/14/16 1727  BP: (!) 132/57 (!) 133/94 (!) 137/51   Pulse:  66  75  Resp:  20    Temp:  98.3 F (36.8 C) 98.6 F (37 C)   TempSrc:  Oral Oral   SpO2:  98%    Weight:      Height:        Intake/Output Summary (Last 24 hours) at 07/14/16 1828 Last data filed at 07/14/16 1400  Gross per 24 hour  Intake              600 ml  Output                0 ml  Net              600 ml   Filed Weights   07/11/16 0516 07/12/16 0502 07/13/16 0401  Weight: (!) 136.5 kg (300 lb 14.4 oz) (!) 138.3 kg (304 lb 12.8 oz) (!) 138.3 kg (305 lb)    Exam  General: Well developed, well nourished, NAD  HEENT: NCAT,mucous membranes moist.   Cardiovascular: S1 S2 auscultated, Irregular, 1/6SEM  Respiratory: Clear,  occ cough (nonproductive)  Abdomen: Soft, obese, nontender, nondistended, + bowel sounds  Extremities: warm dry without cyanosis clubbing or edema  Neuro: AAOx3, nonfocal  Psych: Normal affect and demeanor, pleasant   Data Reviewed: I have personally reviewed following labs and imaging studies  CBC:  Recent Labs Lab 07/10/16 0502 07/11/16 0254 07/12/16 0539 07/13/16 0541 07/14/16 0331  WBC 17.3* 19.8* 18.5* 19.5* 14.7*  HGB 9.2* 9.1* 9.3* 9.3* 9.5*  HCT 29.4* 28.8* 29.2* 29.0* 29.4*  MCV 87.8 88.1 88.2 86.8 87.5  PLT 428* 432* 422* 454* 435*   Basic Metabolic Panel:  Recent Labs Lab 07/10/16 0502 07/11/16 0254 07/12/16 1252 07/13/16 0541 07/14/16 0331  NA 138 138 135 135 138  K 4.3 4.4 4.7 4.4 4.4  CL 101 102 102 101 99*  CO2 29 29 26 26  32  GLUCOSE  250* 272* 393* 273* 313*  BUN 33* 30* 29* 29* 30*  CREATININE 1.06* 1.13* 1.08* 1.02* 1.12*  CALCIUM 8.6* 8.8* 8.8* 8.7* 8.6*   Liver Function Tests:  Recent Labs Lab 07/08/16 0424  AST 60*  ALT 39  ALKPHOS 53  BILITOT 0.8  PROT 7.4  ALBUMIN 2.3*   Coagulation Profile:  Recent Labs Lab 07/11/16 0929  INR 1.14   Cardiac Enzymes: No results for input(s): CKTOTAL, CKMB, CKMBINDEX, TROPONINI in the last 168 hours. BNP (last 3 results) No results for input(s): PROBNP in the last 8760 hours. HbA1C: No results for input(s): HGBA1C in the last 72 hours. CBG:  Recent Labs Lab 07/14/16 0623 07/14/16 0813 07/14/16 1140 07/14/16 1335 07/14/16 1632  GLUCAP 307* 230* 187* 175* 214*   Urine analysis:    Component Value Date/Time   COLORURINE YELLOW 07/10/2016 1021   APPEARANCEUR CLEAR 07/10/2016 1021   LABSPEC 1.018 07/10/2016 1021   PHURINE 6.0 07/10/2016 1021   GLUCOSEU 50 (A) 07/10/2016 1021   HGBUR SMALL (A) 07/10/2016 1021   BILIRUBINUR NEGATIVE 07/10/2016 1021   BILIRUBINUR Negative 03/19/2014 1631   KETONESUR NEGATIVE 07/10/2016 1021   PROTEINUR NEGATIVE 07/10/2016 1021   UROBILINOGEN  0.2 03/19/2014 1631   NITRITE NEGATIVE 07/10/2016 1021   LEUKOCYTESUR SMALL (A) 07/10/2016 1021   Recent Results (from the past 240 hour(s))  Respiratory Panel by PCR     Status: Abnormal   Collection Time: 07/06/16  1:00 AM  Result Value Ref Range Status   Adenovirus NOT DETECTED NOT DETECTED Final   Coronavirus 229E NOT DETECTED NOT DETECTED Final   Coronavirus HKU1 NOT DETECTED NOT DETECTED Final   Coronavirus NL63 NOT DETECTED NOT DETECTED Final   Coronavirus OC43 NOT DETECTED NOT DETECTED Final   Metapneumovirus NOT DETECTED NOT DETECTED Final   Rhinovirus / Enterovirus NOT DETECTED NOT DETECTED Final   Influenza A H3 DETECTED (A) NOT DETECTED Final   Influenza B NOT DETECTED NOT DETECTED Final   Parainfluenza Virus 1 NOT DETECTED NOT DETECTED Final   Parainfluenza Virus 2 NOT DETECTED NOT DETECTED Final   Parainfluenza Virus 3 NOT DETECTED NOT DETECTED Final   Parainfluenza Virus 4 NOT DETECTED NOT DETECTED Final   Respiratory Syncytial Virus NOT DETECTED NOT DETECTED Final   Bordetella pertussis NOT DETECTED NOT DETECTED Final   Chlamydophila pneumoniae NOT DETECTED NOT DETECTED Final   Mycoplasma pneumoniae NOT DETECTED NOT DETECTED Final  Blood Culture (routine x 2)     Status: None   Collection Time: 07/06/16  1:05 AM  Result Value Ref Range Status   Specimen Description BLOOD LEFT ARM  Final   Special Requests BOTTLES DRAWN AEROBIC AND ANAEROBIC  Final   Culture NO GROWTH 5 DAYS  Final   Report Status 07/11/2016 FINAL  Final  Blood Culture (routine x 2)     Status: None   Collection Time: 07/06/16  1:35 AM  Result Value Ref Range Status   Specimen Description BLOOD RIGHT HAND  Final   Special Requests BOTTLES DRAWN AEROBIC AND ANAEROBIC  Final   Culture NO GROWTH 5 DAYS  Final   Report Status 07/11/2016 FINAL  Final  MRSA PCR Screening     Status: None   Collection Time: 07/06/16  6:47 AM  Result Value Ref Range Status   MRSA by PCR NEGATIVE NEGATIVE Final     Comment:        The GeneXpert MRSA Assay (FDA approved for NASAL specimens only), is one  component of a comprehensive MRSA colonization surveillance program. It is not intended to diagnose MRSA infection nor to guide or monitor treatment for MRSA infections.   Urine culture     Status: Abnormal   Collection Time: 07/06/16 10:40 AM  Result Value Ref Range Status   Specimen Description URINE, RANDOM  Final   Special Requests NONE  Final   Culture (A)  Final    >=100,000 COLONIES/mL VANCOMYCIN RESISTANT ENTEROCOCCUS   Report Status 07/10/2016 FINAL  Final   Organism ID, Bacteria VANCOMYCIN RESISTANT ENTEROCOCCUS (A)  Final      Susceptibility   Vancomycin resistant enterococcus - MIC*    AMPICILLIN <=2 SENSITIVE Sensitive     LEVOFLOXACIN >=8 RESISTANT Resistant     NITROFURANTOIN <=16 SENSITIVE Sensitive     VANCOMYCIN >=32 RESISTANT Resistant     LINEZOLID 2 SENSITIVE Sensitive     * >=100,000 COLONIES/mL VANCOMYCIN RESISTANT ENTEROCOCCUS  Culture, Urine     Status: None   Collection Time: 07/10/16 10:21 AM  Result Value Ref Range Status   Specimen Description URINE, RANDOM  Final   Special Requests NONE  Final   Culture NO GROWTH  Final   Report Status 07/11/2016 FINAL  Final      Radiology Studies: No results found.   Scheduled Meds: . amLODipine  2.5 mg Oral Daily  . apixaban  5 mg Oral BID  . aspirin EC  81 mg Oral Daily  . atorvastatin  40 mg Oral q1800  . carvedilol  25 mg Oral BID WC  . chlorhexidine  15 mL Mouth Rinse BID  . furosemide  40 mg Oral Daily  . insulin aspart  0-20 Units Subcutaneous TID WC  . insulin aspart  0-5 Units Subcutaneous QHS  . insulin glargine  20 Units Subcutaneous Daily  . mouth rinse  15 mL Mouth Rinse q12n4p  . nitrofurantoin (macrocrystal-monohydrate)  100 mg Oral Q12H  . predniSONE  40 mg Oral Q breakfast  . senna-docusate  1 tablet Oral BID  . sodium chloride flush  3 mL Intravenous Q12H  . sodium chloride flush  3 mL  Intravenous Q12H   Continuous Infusions: . amiodarone 30 mg/hr (07/14/16 0529)     LOS: 8 days   Time Spent in minutes   30 minutes  MAGICK-MYERS, ISKRA MD. on 07/14/2016 at 6:28 PM  Between 7am to 7pm - Pager - [phone]  After 7pm go to [email or web address] - password [email or web address]  And look for the night coverage person covering for me after hours  [facility] Office  [phone]

## 2016-07-14 NOTE — Progress Notes (Signed)
Results for [name], Mareta C (MRN [id number]) as of 07/14/2016 10:24  Ref. Range 07/13/2016 11:50 07/13/2016 17:28 07/13/2016 21:25 07/14/2016 06:23 07/14/2016 08:13  Glucose-Capillary Latest Ref Range: 65 - 99 mg/dL 409199 (H) 811394 (H) 914392 (H) 307 (H) 230 (H)  Noted that blood sugars continue to be greater than 300 mg/dl. Recommend adding Novolog 6 units TID with meals if patient eats at least 50% of meals and while on steroids. Will continue to monitor blood sugars while in the hospital. Smith MinceKendra Zeddie Njie RN BSN CDE

## 2016-07-14 NOTE — Progress Notes (Addendum)
Progress Note  Patient Name: Jamie Mason Date of Encounter: 07/14/2016  Primary Cardiologist: New to Dr. Eden EmmsNishan  Subjective   Feeling better. Breathing improving. No chest pain.   Inpatient Medications    Scheduled Meds: . apixaban  5 mg Oral BID  . aspirin EC  81 mg Oral Daily  . atorvastatin  40 mg Oral q1800  . carvedilol  25 mg Oral BID WC  . chlorhexidine  15 mL Mouth Rinse BID  . furosemide  40 mg Oral Daily  . insulin aspart  0-20 Units Subcutaneous TID WC  . insulin aspart  0-5 Units Subcutaneous QHS  . insulin glargine  20 Units Subcutaneous Daily  . mouth rinse  15 mL Mouth Rinse q12n4p  . nitrofurantoin (macrocrystal-monohydrate)  100 mg Oral Q12H  . predniSONE  40 mg Oral Q breakfast  . senna-docusate  1 tablet Oral BID  . sodium chloride flush  3 mL Intravenous Q12H  . sodium chloride flush  3 mL Intravenous Q12H   Continuous Infusions: . amiodarone 30 mg/hr (07/14/16 0529)  . dilTIAZem HCl-Dextrose Stopped (07/13/16 0410)   PRN Meds: sodium chloride, sodium chloride, sodium chloride, acetaminophen, albuterol, ondansetron (ZOFRAN) IV, sodium chloride flush, sodium chloride flush   Vital Signs    Vitals:   07/13/16 1727 07/13/16 2128 07/14/16 0618 07/14/16 0710  BP: (!) 153/70 (!) 128/47 (!) 150/78 (!) 165/60  Pulse: 79 65 (!) 59 71  Resp:  18 15   Temp:  98 F (36.7 C) 98.1 F (36.7 C)   TempSrc:  Oral Oral   SpO2:  100% 98%   Weight:      Height:        Intake/Output Summary (Last 24 hours) at 07/14/16 0909 Last data filed at 07/13/16 1700  Gross per 24 hour  Intake              240 ml  Output              700 ml  Net             -460 ml   Filed Weights   07/11/16 0516 07/12/16 0502 07/13/16 0401  Weight: (!) 300 lb 14.4 oz (136.5 kg) (!) 304 lb 12.8 oz (138.3 kg) (!) 305 lb (138.3 kg)    Telemetry    Sinus rhythm with PACs- Personally Reviewed  ECG  N/A  Physical Exam   GEN: No acute distress.  Obese female on nasal  cannula.  Neck: No JVD Cardiac: RRR, soft murmurs, rubs, or gallops.  Respiratory: diminished breath sound.  GI: Soft, nontender, non-distended  MS: No edema; No deformity. Neuro:  Nonfocal  Psych: Normal affect   Labs    Chemistry Recent Labs Lab 07/07/16 0921 07/08/16 0424  07/12/16 1252 07/13/16 0541 07/14/16 0331  NA 140 139  < > 135 135 138  K 3.9 3.7  < > 4.7 4.4 4.4  CL 101 100*  < > 102 101 99*  CO2 28 30  < > 26 26 32  GLUCOSE 195* 185*  < > 393* 273* 313*  BUN 29* 34*  < > 29* 29* 30*  CREATININE 1.57* 1.44*  < > 1.08* 1.02* 1.12*  CALCIUM 8.4* 8.3*  < > 8.8* 8.7* 8.6*  PROT 7.5 7.4  --   --   --   --   ALBUMIN 2.5* 2.3*  --   --   --   --   AST 99* 60*  --   --   --   --  ALT 45 39  --   --   --   --   ALKPHOS 52 53  --   --   --   --   BILITOT 0.6 0.8  --   --   --   --   GFRNONAA 32* 35*  < > 50* 53* 48*  GFRAA 37* 41*  < > 58* >60 55*  ANIONGAP 11 9  < > 7 8 7   < > = values in this interval not displayed.   Hematology Recent Labs Lab 07/12/16 0539 07/13/16 0541 07/14/16 0331  WBC 18.5* 19.5* 14.7*  RBC 3.31* 3.34* 3.36*  HGB 9.3* 9.3* 9.5*  HCT 29.2* 29.0* 29.4*  MCV 88.2 86.8 87.5  MCH 28.1 27.8 28.3  MCHC 31.8 32.1 32.3  RDW 16.1* 15.9* 16.2*  PLT 422* 454* 435*    Cardiac EnzymesNo results for input(s): TROPONINI in the last 168 hours. No results for input(s): TROPIPOC in the last 168 hours.   BNPNo results for input(s): BNP, PROBNP in the last 168 hours.   DDimer No results for input(s): DDIMER in the last 168 hours.   Radiology    No results found.  Cardiac Studies   Echo 06/27/16 LV EF: 60% -   65%  ------------------------------------------------------------------- Indications:      Dyspnea 786.09.  ------------------------------------------------------------------- History:   PMH:  Lower Extremity Edema with bilateral ulcerations. Coronary artery disease.  PMH:   Myocardial infarction.  Risk factors:  Hypertension.  Diabetes mellitus. Dyslipidemia.  ------------------------------------------------------------------- Study Conclusions  - Left ventricle: LVEF is normal at 60 to 65% with distal inferior   hypokinesis. The cavity size was normal. Wall thickness was   increased in a pattern of moderate LVH. Systolic function was   normal. The estimated ejection fraction was in the range of 60%   to 65%. Features are consistent with a pseudonormal left   ventricular filling pattern, with concomitant abnormal relaxation   and increased filling pressure (grade 2 diastolic dysfunction).   Doppler parameters are consistent with high ventricular filling   pressure. - Mitral valve: There was mild regurgitation. - Pulmonary arteries: PA peak pressure: 47 mm Hg (S).   Left Heart Cath and Coronary Angiography  07/11/16  Conclusion   Conclusions: 1. 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 2. Moderate disease (40-60%) involving the distal LMCA, mid and distal LAD, ostial LCx, and OM2. 3. Moderately elevated left ventricular filling pressure (LVEDP 28-30 mmHg).  Recommendations: 1. Medical therapy of elevated troponin likely due to supply-demand mismatch. 2. Aggressive secondary prevention and management of atrial fibrillation with rapid ventricular response. 3. Escalate diuresis beginning tomorrow. Would try to maintain net even to slightly negative fluid balance overnight given contrast load and recent acute kidney injury. 4. Restart heparin infusion 4 hours after TR band removal.  Jamie Kendall, MD     Patient Profile     76 y.o.femalewith a history of  CAD with known RCA 100%, Hypertensive heart disease, hyperlipidemia, diabetes, lumbar disc problem, morbid obesity, former smoker and recent Admit 01/14-01/18 with LE wounds who again admit 01/25 w/ SOB, atrial fib RVR and NSTEMI.   Assessment & Plan    1. Acute on chronic respiratory failure and flu -Per primary  team  2. NSTEMI - Peak of troponin 3.18 then trended down. Cath 1/29 showed chronic total occulusion or mid RCA with L to R collaterals. Moderate disease elsewhere. Plan to treat medically. Continue ASA, BB and statin.   3.  Aflutter RVR - Successful DCCV yesterday. Maintaining sinus rhythm with PACs.Contiue BB and IV amiodarone. Echo as above.   4. Acute diastolic CHF - Net I & O positive 7 L. Doubt accurate. Continue lasix po 40mg .   Signed, Bhagat,Bhavinkumar, PA  07/14/2016, 9:09 AM     The patient was seen, examined and discussed with Bhagat,Bhavinkumar PA-C and I agree with the above.   The patient underwent a successful DCCV yesterday, she was placed on iv amiodarone the last night. She remains in SR but with very frequent PACs. We will continue iv amiodarone till tomorrow (30 hours total), then switch to amiodarone 400 mg po bid. She is hypertensive, I will start amlodipine 2.5 mg po daily. Discontinue cardizem drip.  Tobias Alexander, MD 07/14/2016

## 2016-07-14 NOTE — Progress Notes (Signed)
Notified by [facility] Dr. Danie BinderIskra Mason that [name] is currently admitted. [facility] and we will resume her care tomorrow (07/15/2016) morning.  Darreld McleanVishal Patel, MD Internal Medicine Resident PGY-2

## 2016-07-14 NOTE — Progress Notes (Signed)
Pt slide to floor when transferring from chair to bed with NT. VSS, CBG 170's. MD informed. Family present. Pt states she feels fine and no distress is noted. Will continue to monitor.

## 2016-07-15 ENCOUNTER — Telehealth: Payer: Self-pay | Admitting: Internal Medicine

## 2016-07-15 DIAGNOSIS — J1100 Influenza due to unidentified influenza virus with unspecified type of pneumonia: Secondary | ICD-10-CM

## 2016-07-15 DIAGNOSIS — N182 Chronic kidney disease, stage 2 (mild): Secondary | ICD-10-CM

## 2016-07-15 DIAGNOSIS — Z7984 Long term (current) use of oral hypoglycemic drugs: Secondary | ICD-10-CM

## 2016-07-15 DIAGNOSIS — Z7901 Long term (current) use of anticoagulants: Secondary | ICD-10-CM

## 2016-07-15 DIAGNOSIS — E1165 Type 2 diabetes mellitus with hyperglycemia: Secondary | ICD-10-CM

## 2016-07-15 DIAGNOSIS — E1122 Type 2 diabetes mellitus with diabetic chronic kidney disease: Secondary | ICD-10-CM

## 2016-07-15 DIAGNOSIS — I480 Paroxysmal atrial fibrillation: Secondary | ICD-10-CM

## 2016-07-15 DIAGNOSIS — I25118 Atherosclerotic heart disease of native coronary artery with other forms of angina pectoris: Secondary | ICD-10-CM

## 2016-07-15 DIAGNOSIS — D649 Anemia, unspecified: Secondary | ICD-10-CM

## 2016-07-15 LAB — BASIC METABOLIC PANEL
Anion gap: 11 (ref 5–15)
BUN: 24 mg/dL — AB (ref 6–20)
CALCIUM: 8.6 mg/dL — AB (ref 8.9–10.3)
CO2: 27 mmol/L (ref 22–32)
CREATININE: 0.91 mg/dL (ref 0.44–1.00)
Chloride: 99 mmol/L — ABNORMAL LOW (ref 101–111)
GFR calc Af Amer: >60 mL/min (ref >60)
Glucose, Bld: 207 mg/dL — ABNORMAL HIGH (ref 65–99)
Potassium: 3.9 mmol/L (ref 3.5–5.1)
SODIUM: 137 mmol/L (ref 135–145)

## 2016-07-15 LAB — CBC
HCT: 29.9 % — ABNORMAL LOW (ref 36.0–46.0)
Hemoglobin: 9.7 g/dL — ABNORMAL LOW (ref 12.0–15.0)
MCH: 28 pg (ref 26.0–34.0)
MCHC: 32.4 g/dL (ref 30.0–36.0)
MCV: 86.4 fL (ref 78.0–100.0)
PLATELETS: 399 10*3/uL (ref 150–400)
RBC: 3.46 MIL/uL — ABNORMAL LOW (ref 3.87–5.11)
RDW: 16.4 % — ABNORMAL HIGH (ref 11.5–15.5)
WBC: 15.7 10*3/uL — ABNORMAL HIGH (ref 4.0–10.5)

## 2016-07-15 LAB — GLUCOSE, CAPILLARY
GLUCOSE-CAPILLARY: 196 mg/dL — AB (ref 65–99)
GLUCOSE-CAPILLARY: 416 mg/dL — AB (ref 65–99)
Glucose-Capillary: 195 mg/dL — ABNORMAL HIGH (ref 65–99)
Glucose-Capillary: 285 mg/dL — ABNORMAL HIGH (ref 65–99)
Glucose-Capillary: 354 mg/dL — ABNORMAL HIGH (ref 65–99)

## 2016-07-15 MED ORDER — AMIODARONE HCL 200 MG PO TABS
400.0000 mg | ORAL_TABLET | Freq: Two times a day (BID) | ORAL | Status: DC
Start: 2016-07-15 — End: 2016-07-16
  Administered 2016-07-15 – 2016-07-16 (×2): 400 mg via ORAL
  Filled 2016-07-15 (×2): qty 2

## 2016-07-15 MED ORDER — AMIODARONE HCL 200 MG PO TABS
400.0000 mg | ORAL_TABLET | Freq: Two times a day (BID) | ORAL | Status: DC
  Administered 2016-07-15: 400 mg via ORAL
  Filled 2016-07-15: qty 2

## 2016-07-15 NOTE — Progress Notes (Signed)
Inpatient Diabetes Program Recommendations  AACE/ADA: New Consensus Statement on Inpatient Glycemic Control (2015)  Target Ranges:  Prepandial:   less than 140 mg/dL      Peak postprandial:   less than 180 mg/dL (1-2 hours)      Critically ill patients:  140 - 180 mg/dL   Lab Results  Component Value Date   GLUCAP 195 (H) 07/15/2016   HGBA1C 7.3 (H) 06/27/2016   Results for Jamie Mason, Angle C (MRN [id number]) as of 07/15/2016 14:44  Ref. Range 07/14/2016 13:35 07/14/2016 16:32 07/14/2016 21:56 07/15/2016 07:28 07/15/2016 11:43  Glucose-Capillary Latest Ref Range: 65 - 99 mg/dL 119175 (H) 147214 (H) 829285 (H) 196 (H) 195 (H)   MD, please consider Meal Coverage of Novolog 5 units TIDAC if patient eats > 50% of meal.  Thank you,  Kristine LineaKaren Kermitt Harjo, RN, MSN Diabetes Coordinator Inpatient Diabetes Program [phone] (Team Pager)

## 2016-07-15 NOTE — Care Management Note (Signed)
Case Management Note  Patient Details  Name: Jamie Mason MRN: [id number] Date of Birth: 03/17/1941  Subjective/Objective: Pt presented for Acute on Chronic Respiratory Failure. [facility]  Pt is refusing to return to Facility. CM did speak with pt and daughter Jamie Mason in regards to having the CSW to fax her out to a different facility. Patient I/ daughter is agreeable.                  Action/Plan: Pt states she will have to know her bed offers before she says yes to another facility. CM did offer choice for home health services as well. Per pt and daughter pt will have 24 hour supervision at home. Pt's husband in the home and the daughter and patient's sister to assist with care. Per pt if she returns home she will need HHRN, PT, Aide, SW. DME needs Are Hospital bed, RW and 3n1. CM did reach out to MD to make them aware. CM did offer [facility] within 24-48 hours post d/c if plan continues to be home. Pt will need orders written and DME. CM did call [facility] to have them looking for DME orders as well. CM will have weekend CM assist with d/c plans as well.   Expected Discharge Date:                  Expected Discharge Plan:  Skilled Nursing Facility  In-House Referral:  Clinical Social Work  Discharge planning Services  CM Consult  Post Acute Care Choice:  Durable Medical Equipment, Home Health Choice offered to:  Patient, Adult Children  DME Arranged:  Hospital bed, Walker rolling, 3-N-1 DME Agency:     HH Arranged:  RN, PT, Nurse's Aide, Disease Management, Social Work [facility] [facility] Agency:  [facility]  Status of Service:  In process, will continue to follow  If discussed at Long Length of Stay Meetings, dates discussed:    Additional Comments:  Gala LewandowskyGraves-Bigelow, Jenille Laszlo Kaye, RN 07/15/2016, 11:18 AM

## 2016-07-15 NOTE — Telephone Encounter (Signed)
TOC -HFU Per Dr. Antony ContrasGuilloud on 07/19/2016 @ 10:45am with [facility].

## 2016-07-15 NOTE — Progress Notes (Signed)
Progress Note  Patient Name: Jamie Mason Date of Encounter: 07/15/2016  Primary Cardiologist: New to Dr. Eden EmmsNishan  Subjective    Breathing improving. No chest pain. She does not wants to go to rehab.   Inpatient Medications    Scheduled Meds: . amLODipine  2.5 mg Oral Daily  . apixaban  5 mg Oral BID  . aspirin EC  81 mg Oral Daily  . atorvastatin  40 mg Oral q1800  . carvedilol  25 mg Oral BID WC  . chlorhexidine  15 mL Mouth Rinse BID  . furosemide  40 mg Oral Daily  . insulin aspart  0-20 Units Subcutaneous TID WC  . insulin aspart  0-5 Units Subcutaneous QHS  . insulin glargine  20 Units Subcutaneous Daily  . mouth rinse  15 mL Mouth Rinse q12n4p  . predniSONE  40 mg Oral Q breakfast  . senna-docusate  1 tablet Oral BID  . sodium chloride flush  3 mL Intravenous Q12H  . sodium chloride flush  3 mL Intravenous Q12H   Continuous Infusions: . amiodarone 30 mg/hr (07/15/16 0015)   PRN Meds: sodium chloride, sodium chloride, sodium chloride, acetaminophen, albuterol, ondansetron (ZOFRAN) IV, sodium chloride flush, sodium chloride flush   Vital Signs    Vitals:   07/15/16 0016 07/15/16 0543 07/15/16 0728 07/15/16 0803  BP: (!) 142/68 (!) 150/61 (!) 171/78 (!) 126/50  Pulse: 64 63 69 65  Resp: 17 11  (!) 26  Temp:  98.1 F (36.7 C)  98.3 F (36.8 C)  TempSrc:    Oral  SpO2:  95%  95%  Weight:  (!) 303 lb 3.2 oz (137.5 kg)    Height:        Intake/Output Summary (Last 24 hours) at 07/15/16 0930 Last data filed at 07/14/16 2342  Gross per 24 hour  Intake              360 ml  Output              200 ml  Net              160 ml   Filed Weights   07/12/16 0502 07/13/16 0401 07/15/16 0543  Weight: (!) 304 lb 12.8 oz (138.3 kg) (!) 305 lb (138.3 kg) (!) 303 lb 3.2 oz (137.5 kg)    Telemetry    Sinus rhythm with PACs- Personally Reviewed  ECG   N/A  Physical Exam   GEN: No acute distress. Obese female on nasal cannula.  Neck: No JVD Cardiac: RRR,  soft murmurs, rubs, or gallops.  Respiratory: diminished breath sound.  GI: Soft, nontender, non-distended  MS: No edema; No deformity. Neuro:  Nonfocal  Psych: Normal affect   Labs    Chemistry  Recent Labs Lab 07/13/16 0541 07/14/16 0331 07/15/16 0527  NA 135 138 137  K 4.4 4.4 3.9  CL 101 99* 99*  CO2 26 32 27  GLUCOSE 273* 313* 207*  BUN 29* 30* 24*  CREATININE 1.02* 1.12* 0.91  CALCIUM 8.7* 8.6* 8.6*  GFRNONAA 53* 48* >60  GFRAA >60 55* >60  ANIONGAP 8 7 11      Hematology  Recent Labs Lab 07/13/16 0541 07/14/16 0331 07/15/16 0527  WBC 19.5* 14.7* 15.7*  RBC 3.34* 3.36* 3.46*  HGB 9.3* 9.5* 9.7*  HCT 29.0* 29.4* 29.9*  MCV 86.8 87.5 86.4  MCH 27.8 28.3 28.0  MCHC 32.1 32.3 32.4  RDW 15.9* 16.2* 16.4*  PLT 454* 435* 399  Cardiac EnzymesNo results for input(s): TROPONINI in the last 168 hours. No results for input(s): TROPIPOC in the last 168 hours.   BNPNo results for input(s): BNP, PROBNP in the last 168 hours.   DDimer No results for input(s): DDIMER in the last 168 hours.   Radiology    No results found.  Cardiac Studies   Echo 06/27/16 LV EF: 60% -   65%  ------------------------------------------------------------------- Indications:      Dyspnea 786.09.  ------------------------------------------------------------------- History:   PMH:  Lower Extremity Edema with bilateral ulcerations. Coronary artery disease.  PMH:   Myocardial infarction.  Risk factors:  Hypertension. Diabetes mellitus. Dyslipidemia.  ------------------------------------------------------------------- Study Conclusions  - Left ventricle: LVEF is normal at 60 to 65% with distal inferior   hypokinesis. The cavity size was normal. Wall thickness was   increased in a pattern of moderate LVH. Systolic function was   normal. The estimated ejection fraction was in the range of 60%   to 65%. Features are consistent with a pseudonormal left   ventricular filling  pattern, with concomitant abnormal relaxation   and increased filling pressure (grade 2 diastolic dysfunction).   Doppler parameters are consistent with high ventricular filling   pressure. - Mitral valve: There was mild regurgitation. - Pulmonary arteries: PA peak pressure: 47 mm Hg (S).   Left Heart Cath and Coronary Angiography  07/11/16  Conclusion   Conclusions: 1. 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 2. Moderate disease (40-60%) involving the distal LMCA, mid and distal LAD, ostial LCx, and OM2. 3. Moderately elevated left ventricular filling pressure (LVEDP 28-30 mmHg).  Recommendations: 1. Medical therapy of elevated troponin likely due to supply-demand mismatch. 2. Aggressive secondary prevention and management of atrial fibrillation with rapid ventricular response. 3. Escalate diuresis beginning tomorrow. Would try to maintain net even to slightly negative fluid balance overnight given contrast load and recent acute kidney injury. 4. Restart heparin infusion 4 hours after TR band removal.  Jamie Kendall, MD     Patient Profile     76 y.o.femalewith a history of  CAD with known RCA 100%, Hypertensive heart disease, hyperlipidemia, diabetes, lumbar disc problem, morbid obesity, former smoker and recent Admit 01/14-01/18 with LE wounds who again admit 01/25 w/ SOB, atrial fib RVR and NSTEMI.   Assessment & Plan    1. Acute on chronic respiratory failure and flu -Per primary team  2. NSTEMI - Peak of troponin 3.18 then trended down. Cath 1/29 showed chronic total occulusion or mid RCA with L to R collaterals. Moderate disease elsewhere. Plan to treat medically. Continue ASA, BB and statin.   3. Aflutter RVR - Successful DCCV yesterday. Maintaining sinus rhythm with PACs. Now on Iv amiodarone for total 30 hours. Will switch to amiodarone 400mg  BIDx 7 days then 200mg  BID to 2 weeks than 200mg  daily. Continue Eliquis for  anticoagulation.  Contiue BB. Echo as above.   4. Acute diastolic CHF - Net I & O positive 7.8 L. Doubt accurate. Weight down 20lb. Continue lasix po 40mg .   5.HTN - Now improved with addition of amlodipine, up titrate as needed. Continue BB.   6. AKI - Resolved.  Signed, Manson Passey, PA  07/15/2016, 9:30 AM     The patient was seen, examined and discussed with Bhagat,Bhavinkumar PA-C and I agree with the above.   The patient underwent a successful DCCV on 07/13/16, she was placed on iv amiodarone, remains in SR, we will switch to po amiodarone 400 mg po  BID x 3 days, then 400 mg po daily x 3 days, then 200 mg po daily. BP improved with amlodipine, continue the same regimen, should be able to be discharged in the next 1-2 days, she needs to start mobilizing as she has been in bed for the last several days and insisting going directly home.   Tobias Alexander, MD 07/15/2016

## 2016-07-15 NOTE — Telephone Encounter (Signed)
TOC HFU ON 07/19/2016 @ 10:45AM WITH ACC PER R GUILLOUD

## 2016-07-15 NOTE — Clinical Social Work Note (Signed)
CSW met with patient and daughter at bedside. Daughter and patient are refusing SNF placement at this time and state that they will be making arrangements for discharge home. RNCM notified of change is disposition. CSW will sign off at this time.   [name] MSW, [place], [place], [phone]

## 2016-07-15 NOTE — Care Management Important Message (Signed)
Important Message  Patient Details  Name: Jamie Mason MRN: [id number] Date of Birth: 05/12/1941   Medicare Important Message Given:  Yes    Gala LewandowskyGraves-Bigelow, Magaly Pollina Kaye, RN 07/15/2016, 10:31 AM

## 2016-07-15 NOTE — H&P (Signed)
Transfer note from [facility] to [facility]  Brief history: 76 yo f with hx of CAD, NSTEMI, DM II, HLD, HTN, chronic lower ext edema, recently was admitted [facility] 06/26/16-06/30/2016 with Acute diastolif failure, went to SNF from there.  At SNF, she had bronchitis symptoms and was treated with abx, but continued to have SOB and was brought to the hospital on 1/24 with SOB, was admitted to the ICU service. Had fever 102.5, LA 4.49, trop 3.18, with TWI in lateral leads, flu positive. She was determined to have SOB from flu and also dCHF. Was treated with tamiflu and lasix. Card was consulted, had LHC for NSTEMI. Was not put on IV heparin due to hematuria. Was transferred out of ICU on 1/26.  Patient developed Afib/aflutter 2:1, started on dilt gtt. Started on heparin 1/28 then transitioned to Eliquis on 1/30. Patient was also treated for ?VRE UTI with abx, treated with macrobid.   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 40-60% moderate disease to Distal MCA, mid/distal LAD, ostial LCx, OM2. Elevated LV filling prssure 28-7230mmHg.  Recommended medical therapy, diuresis, management of Afib.  Had DCCV on 1/31.    Subjective:   Remains in sinus rhythm with PVCs, on amio gtt. Denies SOB or chest pain, no fevers overnight. Denies any pain. States she does not want to go back to the same SNF where she was.   Objective:  Vital signs in last 24 hours: Vitals:   07/14/16 2300 07/14/16 2342 07/15/16 0016 07/15/16 0543  BP:  (!) 161/98 (!) 142/68 (!) 150/61  Pulse:  67 64 63  Resp:  20 17 11   Temp:  98.1 F (36.7 C)  98.1 F (36.7 C)  TempSrc:      SpO2: 99% 95%  95%  Weight:    (!) 303 lb 3.2 oz (137.5 kg)  Height:       Vitals reviewed. General: resting in bed, NAD HEENT: PERRL, EOMI, no scleral icterus Cardiac: regular rhythm with frequent PVCs, no m/r/g Pulm: clear to auscultation bilaterally, no wheezes, rales, or  rhonchi Abd: soft, nontender, obese, BS present.  Ext: warm and well perfused, dry skin on both feet, some skin cracking noted. Has chronic venous stasis related skin changes. No wounds.  Neuro: alert and oriented X3, cranial nerves II-XII grossly intact, strength and sensation to light touch equal in bilateral upper and lower extremities   Assessment/Plan:  Principal Problem:   Acute on chronic respiratory failure (HCC) Active Problems:   Elevated troponin   Hypoxia   Pressure injury of skin   Flu   Atrial flutter with rapid ventricular response (HCC)   Non-ST elevation (NSTEMI) myocardial infarction (HCC)  Acute on chronic resp failure - improved Likely 2/2 to influenza + dCHF. Improved with diuresis and tamiflu - currently breathing comfortably on room air, satting fine. Finished tamiflu course - cont to monitor. Check ambulatory sats.   DCHF - appears euvolemic now Unclear weight change, on admisison was 301, now 303 but patient on exam looks euvolemic now.  - cont lasix po 40mg  daily - follow I/o, daily weights - keep in NSR  NSTEMI -s/p CATH shwoing chronic total occlusion of mid RCA with left to right collaterals. 40-60% moderate disease to Distal MCA, mid/distal LAD, ostial LCx, OM2. Elevated LV filling prssure 28-5030mmHg.  Recommended medical therapy, diuresis, management of Afib.  Hx of Paroxysmal Afib with AFib RVR this admission s/p DCCV - cont  amio, switch to PO soon hopefully, will f/up card plans - cont eliquis  AKI on CKD, stage II - resolved -Creatinine peaked to 1.95 but now back to normal - cont to monitor   DM II - last hgba1c 7.3 -metfirmon held. Lantus was added as patient was hyperglycemic likely 2/2 to steroid. I don't think she will need insulin on discharge - SSI  Hematuria - was likely 2/2 to heparin gtt, resolved - cont to monitor  Normocytic anemia - unclear etiology - hgb stable for now. Cont to monitor  Dispo: Anticipated discharge in  approximately 1-2 days pending SNF placement.  Hyacinth Meeker, MD 07/15/2016, 7:08 AM Pager: [phone]

## 2016-07-15 NOTE — Discharge Instructions (Signed)

## 2016-07-16 LAB — CBC
HCT: 30.3 % — ABNORMAL LOW (ref 36.0–46.0)
HEMOGLOBIN: 9.9 g/dL — AB (ref 12.0–15.0)
MCH: 28.1 pg (ref 26.0–34.0)
MCHC: 32.7 g/dL (ref 30.0–36.0)
MCV: 86.1 fL (ref 78.0–100.0)
Platelets: 401 10*3/uL — ABNORMAL HIGH (ref 150–400)
RBC: 3.52 MIL/uL — AB (ref 3.87–5.11)
RDW: 16.2 % — ABNORMAL HIGH (ref 11.5–15.5)
WBC: 15.6 10*3/uL — ABNORMAL HIGH (ref 4.0–10.5)

## 2016-07-16 LAB — GLUCOSE, CAPILLARY
GLUCOSE-CAPILLARY: 240 mg/dL — AB (ref 65–99)
GLUCOSE-CAPILLARY: 275 mg/dL — AB (ref 65–99)
Glucose-Capillary: 164 mg/dL — ABNORMAL HIGH (ref 65–99)

## 2016-07-16 MED ORDER — FUROSEMIDE 40 MG PO TABS: 40.0000 mg | ORAL_TABLET | Freq: Every day | ORAL | 2 refills | Status: DC

## 2016-07-16 MED ORDER — APIXABAN 5 MG PO TABS: 5.0000 mg | ORAL_TABLET | Freq: Two times a day (BID) | ORAL | 2 refills | Status: DC

## 2016-07-16 MED ORDER — CARVEDILOL 25 MG PO TABS: 25.0000 mg | ORAL_TABLET | Freq: Two times a day (BID) | ORAL | 2 refills | Status: DC

## 2016-07-16 MED ORDER — AMIODARONE HCL 200 MG PO TABS: 400.0000 mg | ORAL_TABLET | Freq: Two times a day (BID) | ORAL | 2 refills | Status: DC

## 2016-07-16 MED ORDER — AMLODIPINE BESYLATE 2.5 MG PO TABS: 2.5000 mg | ORAL_TABLET | Freq: Every day | ORAL | 2 refills | Status: DC

## 2016-07-16 NOTE — Progress Notes (Signed)
Physical Therapy Treatment Patient Details Name: Jamie Mason MRN: [id number] DOB: 12-Jun-1941 Today's Date: 07/16/2016    History of Present Illness Pt came from SNF in which she was admitted for rehab after recent hospitalization with acute on chronic respiratory failure, flu and NSTEMI. Pt underwent cardiac catherization 1/29. PMH: CAD, CHF, CKD, morbid obesity, DM, HTN LDD, chronic LE wounds.     PT Comments    Patient getting up with nursing on arrival, agreeable to therapy.  Patient insists that she wants to go home vs return to rehab facility.  Patient mobility is overall MIN assist for grossly home level mobility, but with wounds and low activity tolerance from multiple co-morbid issues.  Patient would need extensive equipment and assist to return home, and is still recommended for SNF from rehab status.  Patient is appropriate to continue working with PT services.  Follow Up Recommendations  SNF     Equipment Recommendations  None recommended by PT    Recommendations for Other Services       Precautions / Restrictions Precautions Precautions: Fall Precaution Comments: Larey Seat from a chair several weeks ago Restrictions Weight Bearing Restrictions: No    Mobility  Bed Mobility Overal bed mobility: Needs Assistance Bed Mobility: Supine to Sit;Sit to Supine     Supine to sit: Min guard Sit to supine: Min assist   General bed mobility comments: HOB elevated approx 30 degrees, using rail to assist.   Transfers Overall transfer level: Needs assistance Equipment used: Rolling walker (2 wheeled) Transfers: Sit to/from Stand Sit to Stand: Min guard Stand pivot transfers: Min guard       General transfer comment: cues for hand placement, needing additional time but able to complete with min guard assist. Repeated from EOB and chair.   Ambulation/Gait Ambulation/Gait assistance: Min guard Ambulation Distance (Feet): 60 Feet Assistive device: Rolling walker (2  wheeled) Gait Pattern/deviations: Step-through pattern;Trunk flexed Gait velocity: decresed   General Gait Details: cues for posture as tolerated. Taking seated break between ambulation attempts. Pt ambulating on RA, sats staying in 90s throughout.    Stairs            Wheelchair Mobility    Modified Rankin (Stroke Patients Only)       Balance Overall balance assessment: Needs assistance;History of Falls Sitting-balance support: Feet supported Sitting balance-Leahy Scale: Good     Standing balance support: Bilateral upper extremity supported Standing balance-Leahy Scale: Poor Standing balance comment: using rw                    Cognition Arousal/Alertness: Awake/alert Behavior During Therapy: WFL for tasks assessed/performed Overall Cognitive Status: Within Functional Limits for tasks assessed                      Exercises      General Comments General comments (skin integrity, edema, etc.): working with nursing on LE wounds      Pertinent Vitals/Pain Pain Assessment: No/denies pain    Home Living                      Prior Function            PT Goals (current goals can now be found in the care plan section) Acute Rehab PT Goals Patient Stated Goal: to return home PT Goal Formulation: With patient Time For Goal Achievement: 07/26/16 Potential to Achieve Goals: Good    Frequency    Min 3X/week  PT Plan Current plan remains appropriate    Co-evaluation             End of Session Equipment Utilized During Treatment: Gait belt (O2 on after session) Activity Tolerance: Patient tolerated treatment well Patient left: with call bell/phone within reach;in bed     Time: 1630-1700 PT Time Calculation (min) (ACUTE ONLY): 30 min  Charges:  $Gait Training: 8-22 mins $Therapeutic Activity: 8-22 mins                    G Codes:      Andres Bantz L 07/16/2016, 6:30 PM

## 2016-07-16 NOTE — Care Management Note (Signed)
Case Management Note  Patient Details  Name: Arvella NighGloria C Test MRN: [id number] Date of Birth: 07/16/1940  Subjective/Objective: Spoke with MD who confirmed that all orders are correct and asked that I talk to pt about transport home with non emergency transport. Pt is ambulatory and non-O2 dependent.  CM discussed with pt costs and explored other options. She is expecting son to visit before noon. CM will arrange in spite of cost if Son unable to transport.                   Action/Plan: Will follow.    Expected Discharge Date:                  Expected Discharge Plan:  Skilled Nursing Facility  In-House Referral:  Clinical Social Work  Discharge planning Services  CM Consult  Post Acute Care Choice:  Durable Medical Equipment, Home Health Choice offered to:  Patient, Adult Children  DME Arranged:  Hospital bed, Walker rolling, 3-N-1 DME Agency:     HH Arranged:  RN, PT, Nurse's Aide, Disease Management, Social Work [employer] [facility]  [facility]  Status of Service:  Completed, signed off  If discussed at [employer] Meetings, dates discussed:    Additional Comments:  Yvone NeuCrutchfield, Dixie Jafri M, RN 07/16/2016, 10:24 AM

## 2016-07-16 NOTE — Discharge Summary (Signed)
Name: Jamie Mason MRN: [id number] DOB: 16-Jun-1940 76 y.o. PCP: Burns Spain, MD  Date of Admission: 07/06/2016 12:48 AM Date of Discharge: 07/17/2016 Attending Physician: Tyson Alias, MD  Discharge Diagnosis: 1. Influenza A 2. dCHF exacerbation 3. NSTEMI 4. Atrial Fibrillation with RVR 4. AKI on CKD  Discharge Medications: Allergies as of 07/16/2016      Reactions   Lisinopril Swelling   Tongue swelling    Ace Inhibitors Other (See Comments)   unknown   Penicillins Itching, Rash   Has patient had a PCN reaction causing immediate rash, facial/tongue/throat swelling, SOB or lightheadedness with hypotension: unknown Has patient had a PCN reaction causing severe rash involving mucus membranes or skin necrosis: unknown Has patient had a PCN reaction that required hospitalization: no Has patient had a PCN reaction occurring within the last 10 years: no If all of the above answers are "NO", then may proceed with Cephalosporin use.      Medication List    STOP taking these medications   hydrochlorothiazide 25 MG tablet Commonly known as:  HYDRODIURIL   verapamil 240 MG CR tablet Commonly known as:  CALAN-SR     TAKE these medications   albuterol 108 (90 Base) MCG/ACT inhaler Commonly known as:  PROVENTIL HFA;VENTOLIN HFA Inhale 2 puffs into the lungs every 6 (six) hours as needed for wheezing or shortness of breath.   amiodarone 200 MG tablet Commonly known as:  PACERONE Take 2 tablets (400 mg total) by mouth 2 (two) times daily. 2 tabs twice daily x5 days. Then 1 tab twice daily x2 weeks. Then one tab daily.   amLODipine 2.5 MG tablet Commonly known as:  NORVASC Take 1 tablet (2.5 mg total) by mouth daily.   apixaban 5 MG Tabs tablet Commonly known as:  ELIQUIS Take 1 tablet (5 mg total) by mouth 2 (two) times daily.   ASPIR-LOW 81 MG EC tablet Generic drug:  aspirin TAKE 1 TABLET EVERY DAY   atorvastatin 40 MG tablet Commonly known as:   LIPITOR Take 1 tablet (40 mg total) by mouth daily.   carvedilol 25 MG tablet Commonly known as:  COREG Take 1 tablet (25 mg total) by mouth 2 (two) times daily with a meal.   furosemide 40 MG tablet Commonly known as:  LASIX Take 1 tablet (40 mg total) by mouth daily. What changed:  when to take this   hydrocerin Crea Apply to bilateral LEs after cleansing on Mondays and Thursdays (prior to [employer] application).   metFORMIN 500 MG tablet Commonly known as:  GLUCOPHAGE TAKE 1 TABLET (500 MG TOTAL) BY MOUTH 2 (TWO) TIMES DAILY WITH A MEAL.       Disposition and follow-up:   Jamie Mason was discharged from [facility] in Stable condition.  At the hospital follow up visit please address:  1.  Afib with RVR: Patient was discharged on Eliquis 5mg  BID for atrial fibrillation with RVR requiring DCCV on 1/31. Patient does have a history of paroxsymal a-fib. Please advise on the need for long term anticoagulation. She was also discharged on an amiodarone taper per cardiology recommendations (amiodarone 400mg  BIDx 7 days then 200mg  BID to 2 weeks than 200mg  daily). Please follow up.   2. AKI on CKD stage III: Creatinine peaked at 2.12 and down trended to 0.91 prior to discharge. Please repeat BMP.   3. NSTEMI: Patient underwent cardiac cath on 1/29 with multivessel disease. Patient was managed medically. She was  discharged on ASA 81 daily, lasix 40 mg daily, carvedilol 25 mg BID, amlodipine 2.5 daily, and atorvastatin 40 mg per cards recommendations. Please encourage smoking cessation and secondary prevention.   4.  Labs / imaging needed at time of follow-up: BMP  5.  Pending labs/ test needing follow-up: None  Follow-up Appointments: Follow-up Information    [facility]. Go on 07/19/2016.   Why:  at 10:45 am in the Acute Care Clinic for hospital follow up. Please arrive 15 minutes early.  Contact information: [address] [place] [address] [phone]       [facility] Follow up.   Why:  HHPT will be provided by the above provider. Please allow 24-48 hours for the representative to contact you and make arrangements for the initial visit.  Contact information: [address] [place] [address] [phone]        Inc[facility] - D[facility] Follow up.   Why:  BSC and RW has been delivered to your room. Please call the above number if you have any issues with your equipment.  Contact information: [address] [place] [address] [phone]           Hospital Course by problem list:  1. Acute on Chronic Respiratory Failure: Secondary to influenza A pneumonia and a concomitant dCHF exacerbation. Patient presented on 1/24 with respiratory distress from her nursing home. On EMS arrival oxygen saturation was in the 80s. She was initially placed on a non rebreather and then CPAP with improvement in her oxygenation. On arrival to the ED she was febrile to 102.5 and hemodynamically stable. She was transitioned to BiPAP and started on tamiflu, empiric antibiotics, solumedrol, duonebs, and lasix. Influenza PCR resulted positive for influenza A the following day. She improved clinically and ultimately did not require intubation. She was transferred out of the ICU on 1/25.  Antibiotics were discontinued and she completed a 5 day course of tamiflu.   2. NSTEMI: Patient has known history of CAD with occlusion of the right coronary artery on 04/2011 cath and was noted to have new mild ST elevation with inferolateral T wave inversions on her admission EKG. Troponin was elevated at 3.18 but she denied chest pain. She was started on IV heparin. 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  Patient was managed medically with aggressive secondary prevention including blood pressure control, beta blocker, ACE therapy, and high dose statin.   3. Atrial Fibrillation with RVR: Patient has a history of paroxsysmal atrial fibrillation and developed RVR with rates in the 130s on 1/27. Her coreg dose was increased and she was started on a diltiazem drip. Both were ultimately ineffective. She underwent successful DCCV on 1/31. She was discharged on Eliquis BID with plans to follow up with PCP.   4. Hematuria: Patient developed hematuria on 1/31 felt to be secondary to heparin drip. Heparin was held and hematuria resolved. Hemoglobin remained stable at her baseline. She was transitioned to Eliquis 5 mg BID.   5. AKI on CKD stage III: Creatinine peaked at 2.12 and down trended to 0.91 prior to discharge.   Discharge Vitals:   BP (!) 128/55 (BP Location: Left Arm)   Pulse 65   Temp 98.4 F (36.9 C) (Oral)  Resp 18   Ht 5\' 5"  (1.651 m)   Wt (!) 137 kg (302 lb)   SpO2 100%   BMI 50.26 kg/m   Pertinent Labs, Studies, and Procedures:   06/27/2016 Echocardiogram (prior hospitalization): Study Conclusions - Left ventricle: LVEF is normal at 60 to 65% with distal inferior   hypokinesis. The cavity size was normal. Wall thickness was   increased in a pattern of moderate LVH. Systolic function was   normal. The estimated ejection fraction was in the range of 60% to 65%. Features are consistent with a pseudonormal left   ventricular filling pattern, with concomitant abnormal relaxation   and increased filling pressure (grade 2 diastolic dysfunction).   Doppler parameters are consistent with high ventricular filling   pressure. - Mitral valve: There was mild regurgitation. - Pulmonary arteries: PA peak pressure: 47 mm Hg (S).  07/11/2016 Cardiac Catheterization:  Conclusions: 1. 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 2. Moderate disease  (40-60%) involving the distal LMCA, mid and distal LAD, ostial LCx, and OM2. 3. Moderately elevated left ventricular filling pressure (LVEDP 28-30 mmHg).  Recommendations: 1. Medical therapy of elevated troponin likely due to supply-demand mismatch. 2. Aggressive secondary prevention and management of atrial fibrillation with rapid ventricular response. 3. Escalate diuresis beginning tomorrow. Would try to maintain net even to slightly negative fluid balance overnight given contrast load and recent acute kidney injury. 4. Restart heparin infusion 4 hours after TR band removal.  Discharge Instructions: Discharge Instructions    Call MD for:  difficulty breathing, headache or visual disturbances    Complete by:  As directed    Call MD for:  persistant dizziness or light-headedness    Complete by:  As directed    Call MD for:  temperature >100.4    Complete by:  As directed    Diet - low sodium heart healthy    Complete by:  As directed    Discharge instructions    Complete by:  As directed    [name], there have been multiple changes to your medications. Please take 2 tablets of amiodarone (400 mg total) once this evening before bed. Then take 2 tablets twice a day for the next 5 days. After that, please take just one tablet twice a day for the next 2 weeks. Then, just take one tablet (200 mg) once a day until instructed otherwise by your cardiologist. You have been started on a new blood thinner called Eliquis. Please take this twice a day in addition to your once a day aspirin. For your blood pressure, please take Coreg twice a day and amlodipine once a day. Please continue to take your Lipitor once a day. Your lasix has been decreased to 40 mg once a day. Your hydrochlorothiazide and verapamil have been discontinued - please stop taking these two medications. You have been scheduled for follow with your primary care office on Tuesday the 6th at 10:45 am. Cardiology should call you to  schedule a follow up appointment. If you do not hear from their office next week, please give them a call. It was a pleasure taking care of you. If you have any questions or concerns, call our clinic at [phone] or after hours call [phone] and ask for the internal medicine resident on call. Thank you!   Increase activity slowly    Complete by:  As directed       Signed: Reymundo Pollarolyn Bostyn Kunkler, MD 07/17/2016, 10:06 AM   Pager: [phone]

## 2016-07-16 NOTE — Progress Notes (Signed)
   Subjective: Patient sleeping comfortably this morning on rounds. No complaints. Feels ready for discharge. No family at bedside.   Objective:  Vital signs in last 24 hours: Vitals:   07/15/16 2045 07/16/16 0032 07/16/16 0502 07/16/16 0716  BP:  (!) 156/57 (!) 151/71 (!) 165/55  Pulse: 67 63 64 76  Resp: 16 12 14 13   Temp:  98.9 F (37.2 C) 98.2 F (36.8 C) 98.5 F (36.9 C)  TempSrc:  Oral Oral Oral  SpO2: 98% 96% 92% 94%  Weight:   (!) 137 kg (302 lb)   Height:   5\' 5"  (1.651 m)    Physical Exam Constitutional: Obese, sleeping comfortably, appears comfortable Cardiovascular: RRR, no murmurs, rubs, or gallops.  Pulmonary/Chest: CTAB, no wheezes, rales, or rhonchi. No chest wall abnormalities.  Abdominal: Soft, non tender, non distended. +BS.  Extremities: Warm and well perfused. Dry skin on both feet, some skin cracking noted. Has chronic venous stasis related skin changes. No wounds.  Neurological: A&Ox3, CN II - XII grossly intact.   Assessment/Plan:  Acute on chronic resp failure - improved Likely 2/2 to influenza + dCHF. Improved with diuresis and tamiflu - currently breathing comfortably on room air, satting fine. Finished tamiflu course - cont to monitor. Check ambulatory sats.   DCHF - appears euvolemic now Unclear weight change, on admisison was 301, now 303 but patient on exam looks euvolemic now.  - cont lasix po 40mg  daily - follow I/o, daily weights - keep in NSR  NSTEMI - s/p CATH shwoing chronic total occlusion of mid RCA with left to right collaterals. 40-60% moderate disease to Distal MCA, mid/distal LAD, ostial LCx, OM2. Elevated LV filling prssure 28-5330mmHg. Recommended medical therapy, diuresis, management of Afib.  Hx of Paroxysmal Afib with AFib RVR this admission s/p DCCV - Cards following, appreciate recs - Changed to PO amio yesterday, will f/up card plans - cont eliquis  AKI on CKD, stage II - resolved -Creatinine peaked to 1.95 but  now back to normal - cont to monitor  DM II - last hgba1c 7.3 - metfirmon held. Lantus was added as patient was hyperglycemic likely 2/2 to steroid. I don't think she will need insulin on discharge - SSI  Hematuria - was likely 2/2 to heparin gtt, resolved - cont to monitor  Normocytic anemia - unclear etiology - hgb stable for now. Cont to monitor  Dispo: Anticipated discharge today.   Jamie Pollarolyn Calvin Chura, MD 07/16/2016, 10:06 AM Pager: [phone]

## 2016-07-18 DIAGNOSIS — I5030 Unspecified diastolic (congestive) heart failure: Secondary | ICD-10-CM | POA: Diagnosis not present

## 2016-07-18 DIAGNOSIS — L89893 Pressure ulcer of other site, stage 3: Secondary | ICD-10-CM | POA: Diagnosis not present

## 2016-07-18 DIAGNOSIS — I2510 Atherosclerotic heart disease of native coronary artery without angina pectoris: Secondary | ICD-10-CM | POA: Diagnosis not present

## 2016-07-18 DIAGNOSIS — I214 Non-ST elevation (NSTEMI) myocardial infarction: Secondary | ICD-10-CM | POA: Diagnosis not present

## 2016-07-18 DIAGNOSIS — L89313 Pressure ulcer of right buttock, stage 3: Secondary | ICD-10-CM | POA: Diagnosis not present

## 2016-07-18 DIAGNOSIS — I4891 Unspecified atrial fibrillation: Secondary | ICD-10-CM | POA: Diagnosis not present

## 2016-07-18 DIAGNOSIS — N182 Chronic kidney disease, stage 2 (mild): Secondary | ICD-10-CM | POA: Diagnosis not present

## 2016-07-18 DIAGNOSIS — E1122 Type 2 diabetes mellitus with diabetic chronic kidney disease: Secondary | ICD-10-CM | POA: Diagnosis not present

## 2016-07-18 DIAGNOSIS — I130 Hypertensive heart and chronic kidney disease with heart failure and stage 1 through stage 4 chronic kidney disease, or unspecified chronic kidney disease: Secondary | ICD-10-CM | POA: Diagnosis not present

## 2016-07-18 NOTE — Care Management (Addendum)
1118 07-18-16 Tomi BambergerBrenda Graves-Bigelow, RN,BSN [phone] Post D/c entry: CM received call from sister Alona BeneJoyce in regards to that the Hospital bed is too short for patient and that she needs an overlay air mattress due to skin breakdown. CM did [facility] if [facility] and family stated yes. Family was told to call Hospital back. [facility]. [facility] to call patient to assist with needs. Pt will have to f/u with PCP for additional orders. No further needs from CM at this time.

## 2016-07-19 ENCOUNTER — Telehealth: Payer: Self-pay

## 2016-07-19 ENCOUNTER — Encounter: Payer: Self-pay | Admitting: Internal Medicine

## 2016-07-19 ENCOUNTER — Ambulatory Visit: Payer: Medicare HMO

## 2016-07-19 NOTE — Telephone Encounter (Signed)
Pt daughter needs to speak with a nurse about mother having no energy and about living assistant. Please call back.

## 2016-07-20 ENCOUNTER — Telehealth: Payer: Self-pay

## 2016-07-20 DIAGNOSIS — I2510 Atherosclerotic heart disease of native coronary artery without angina pectoris: Secondary | ICD-10-CM | POA: Diagnosis not present

## 2016-07-20 DIAGNOSIS — I130 Hypertensive heart and chronic kidney disease with heart failure and stage 1 through stage 4 chronic kidney disease, or unspecified chronic kidney disease: Secondary | ICD-10-CM | POA: Diagnosis not present

## 2016-07-20 DIAGNOSIS — I5030 Unspecified diastolic (congestive) heart failure: Secondary | ICD-10-CM | POA: Diagnosis not present

## 2016-07-20 DIAGNOSIS — L89313 Pressure ulcer of right buttock, stage 3: Secondary | ICD-10-CM | POA: Diagnosis not present

## 2016-07-20 DIAGNOSIS — I4891 Unspecified atrial fibrillation: Secondary | ICD-10-CM | POA: Diagnosis not present

## 2016-07-20 DIAGNOSIS — E1122 Type 2 diabetes mellitus with diabetic chronic kidney disease: Secondary | ICD-10-CM | POA: Diagnosis not present

## 2016-07-20 DIAGNOSIS — N182 Chronic kidney disease, stage 2 (mild): Secondary | ICD-10-CM | POA: Diagnosis not present

## 2016-07-20 DIAGNOSIS — L89893 Pressure ulcer of other site, stage 3: Secondary | ICD-10-CM | POA: Diagnosis not present

## 2016-07-20 DIAGNOSIS — I214 Non-ST elevation (NSTEMI) myocardial infarction: Secondary | ICD-10-CM | POA: Diagnosis not present

## 2016-07-20 NOTE — Telephone Encounter (Signed)
[name] to speak with a nurse regarding pt. Please call [phone] ext: [phone].

## 2016-07-20 NOTE — Telephone Encounter (Signed)
[facility] discharged on  (amiodarone 400mg  BIDx 7 days then 200mg  BID to 2 weeks than 200mg  daily).

## 2016-07-20 NOTE — Telephone Encounter (Signed)
[facility] VO. Please call back.

## 2016-07-20 NOTE — NC FL2 (Signed)
[place] MEDICAID FL2 LEVEL OF CARE SCREENING TOOL     IDENTIFICATION  Patient Name: Jamie Mason Birthdate: 12/14/1940 Sex: female Admission Date (Current Location): 06/26/2016  [facility] [place]   Facility and Address:  [facility], [address], [place], [place] [address]      Provider Number: [id number]  Attending Physician Name and Address:  No att. providers found  Relative Name and Phone Number:       Current Level of Care: Hospital Recommended Level of Care: Skilled Nursing Facility Prior Approval Number:    Date Approved/Denied:   PASRR Number: [id number] [id number]  Discharge Plan: SNF    Current Diagnoses: Patient Active Problem List   Diagnosis Date Noted  . Influenza with pneumonia   . Atrial flutter with rapid ventricular response (HCC) 07/11/2016  . Non-ST elevation (NSTEMI) myocardial infarction (HCC)   . Flu   . Acute on chronic respiratory failure (HCC) 07/06/2016  . Pressure injury of skin 07/06/2016  . Elevated troponin   . Hypoxia   . Acute diastolic heart failure (HCC) 06/30/2016  . Chronic venous insufficiency 06/30/2016  . Sepsis (HCC) 06/26/2016  . Goals of care, counseling/discussion 07/24/2015  . Severe obesity (BMI >= 40) (HCC) 08/28/2014  . 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 Dyspnea on exertion 03/19/2014  . History of tobacco use 03/19/2014  . Healthcare maintenance 03/19/2014  . CAD (coronary artery disease) 05/03/2011  . Hypertension 05/02/2011  . Hyperlipidemia 05/02/2011  . Lumbar disc disease 05/02/2011    Orientation RESPIRATION BLADDER Height & Weight     Self, Time, Situation, Place  Normal Incontinent Weight: (!) 323 lb 10.2 oz (146.8 kg) Height:  5\' 5"  (165.1 cm)  BEHAVIORAL SYMPTOMS/MOOD NEUROLOGICAL BOWEL NUTRITION STATUS   (None)  (None) Continent Diet (Low sodium heart healthy)  AMBULATORY STATUS COMMUNICATION OF NEEDS Skin    Limited Assist Verbally Skin abrasions, Other (Comment), PU Stage and Appropriate Care (Cellulitis, Cracking, Weeping, MASD. Open/dehisced wound/incision: Right and left buttocks (Foam (alginate) daily))   PU Stage 2 Dressing: Daily (Right posterior lower thigh: Foam (Aquacel)) PU Stage 3 Dressing: Daily (Left lower buttocks: Foam (aquacel). Left posterior thigh: Foam (Alginate))                 Personal Care Assistance Level of Assistance  Bathing, Feeding, Dressing Bathing Assistance: Maximum assistance Feeding assistance: Independent Dressing Assistance: Maximum assistance     Functional Limitations Info  Sight, Hearing, Speech Sight Info: Adequate Hearing Info: Adequate Speech Info: Adequate    SPECIAL CARE FACTORS FREQUENCY  PT (By licensed PT), Blood pressure, OT (By licensed OT)     PT Frequency: 5 x week OT Frequency: 5 x week            Contractures Contractures Info: Not present    Additional Factors Info  Code Status, Allergies Code Status Info: Full Allergies Info: Lisinopril, Ace Inhibitors, Penicillins           Current Medications (07/20/2016):  This is the current hospital active medication list No current facility-administered medications for this encounter.    Current Outpatient Prescriptions  Medication Sig Dispense Refill  . albuterol (PROVENTIL HFA;VENTOLIN HFA) 108 (90 Base) MCG/ACT inhaler Inhale 2 puffs into the lungs every 6 (six) hours as needed for wheezing or shortness of breath. 3 Inhaler 3  . ASPIR-LOW 81 MG EC tablet TAKE 1 TABLET EVERY DAY 90 tablet 3  .  atorvastatin (LIPITOR) 40 MG tablet Take 1 tablet (40 mg total) by mouth daily. 90 tablet 3  . metFORMIN (GLUCOPHAGE) 500 MG tablet TAKE 1 TABLET (500 MG TOTAL) BY MOUTH 2 (TWO) TIMES DAILY WITH A MEAL. 180 tablet 3  . amiodarone (PACERONE) 200 MG tablet Take 2 tablets (400 mg total) by mouth 2 (two) times daily. 2 tabs twice daily x5 days. Then 1 tab twice daily x2 weeks. Then one  tab daily. 60 tablet 2  . amLODipine (NORVASC) 2.5 MG tablet Take 1 tablet (2.5 mg total) by mouth daily. 30 tablet 2  . apixaban (ELIQUIS) 5 MG TABS tablet Take 1 tablet (5 mg total) by mouth 2 (two) times daily. 60 tablet 2  . carvedilol (COREG) 25 MG tablet Take 1 tablet (25 mg total) by mouth 2 (two) times daily with a meal. 60 tablet 2  . furosemide (LASIX) 40 MG tablet Take 1 tablet (40 mg total) by mouth daily. 30 tablet 2  . hydrocerin (EUCERIN) CREA Apply to bilateral LEs after cleansing on Mondays and Thursdays (prior to [employer] application). 113 g 0     Discharge Medications: Please see discharge summary for a list of discharge medications.  Relevant Imaging Results:  Relevant Lab Results:   Additional Information SS#: [id number]  Margarito Liner, LCSW

## 2016-07-20 NOTE — Telephone Encounter (Signed)
Spoke w/ pt's daughter yesterday and [facility], they are all in agreement that pt needs snf at this time, [facility] and let me know what on this side we need to do to get pt help asap

## 2016-07-20 NOTE — Telephone Encounter (Signed)
HH OT/ PT VO given for evaluation to be placed in SNF, do you agree?

## 2016-07-20 NOTE — Telephone Encounter (Signed)
Lm for rtc 

## 2016-07-21 DIAGNOSIS — I2510 Atherosclerotic heart disease of native coronary artery without angina pectoris: Secondary | ICD-10-CM | POA: Diagnosis not present

## 2016-07-21 DIAGNOSIS — E1122 Type 2 diabetes mellitus with diabetic chronic kidney disease: Secondary | ICD-10-CM | POA: Diagnosis not present

## 2016-07-21 DIAGNOSIS — I214 Non-ST elevation (NSTEMI) myocardial infarction: Secondary | ICD-10-CM | POA: Diagnosis not present

## 2016-07-21 DIAGNOSIS — N182 Chronic kidney disease, stage 2 (mild): Secondary | ICD-10-CM | POA: Diagnosis not present

## 2016-07-21 DIAGNOSIS — I130 Hypertensive heart and chronic kidney disease with heart failure and stage 1 through stage 4 chronic kidney disease, or unspecified chronic kidney disease: Secondary | ICD-10-CM | POA: Diagnosis not present

## 2016-07-21 DIAGNOSIS — I5030 Unspecified diastolic (congestive) heart failure: Secondary | ICD-10-CM | POA: Diagnosis not present

## 2016-07-21 DIAGNOSIS — L89893 Pressure ulcer of other site, stage 3: Secondary | ICD-10-CM | POA: Diagnosis not present

## 2016-07-21 DIAGNOSIS — L89313 Pressure ulcer of right buttock, stage 3: Secondary | ICD-10-CM | POA: Diagnosis not present

## 2016-07-21 DIAGNOSIS — I4891 Unspecified atrial fibrillation: Secondary | ICD-10-CM | POA: Diagnosis not present

## 2016-07-21 NOTE — Telephone Encounter (Signed)
Have spoken to family and pt, she is not doing well and have put proper agencies in order to provide snf placement, that is in process

## 2016-07-21 NOTE — Telephone Encounter (Signed)
dysheka called back and states she sent a revised letter this am to dr butcher via [phone] and needs it signed asap for pt to be placed at [facility]

## 2016-07-21 NOTE — Telephone Encounter (Signed)
The medication list looks correct to me. Cardiology wanted amiodarone 400mg  BIDx 7 days then 200mg  BID to 2 weeks than 200mg  daily - but she had received 2 days in the hospital before discharge. I am also not certain how to change this after discharge.

## 2016-07-21 NOTE — Telephone Encounter (Signed)
yes

## 2016-07-22 DIAGNOSIS — N182 Chronic kidney disease, stage 2 (mild): Secondary | ICD-10-CM | POA: Diagnosis not present

## 2016-07-22 DIAGNOSIS — E1122 Type 2 diabetes mellitus with diabetic chronic kidney disease: Secondary | ICD-10-CM | POA: Diagnosis not present

## 2016-07-22 DIAGNOSIS — I4891 Unspecified atrial fibrillation: Secondary | ICD-10-CM | POA: Diagnosis not present

## 2016-07-22 DIAGNOSIS — I2510 Atherosclerotic heart disease of native coronary artery without angina pectoris: Secondary | ICD-10-CM | POA: Diagnosis not present

## 2016-07-22 DIAGNOSIS — I214 Non-ST elevation (NSTEMI) myocardial infarction: Secondary | ICD-10-CM | POA: Diagnosis not present

## 2016-07-22 DIAGNOSIS — I130 Hypertensive heart and chronic kidney disease with heart failure and stage 1 through stage 4 chronic kidney disease, or unspecified chronic kidney disease: Secondary | ICD-10-CM | POA: Diagnosis not present

## 2016-07-22 DIAGNOSIS — I5030 Unspecified diastolic (congestive) heart failure: Secondary | ICD-10-CM | POA: Diagnosis not present

## 2016-07-22 DIAGNOSIS — L89313 Pressure ulcer of right buttock, stage 3: Secondary | ICD-10-CM | POA: Diagnosis not present

## 2016-07-22 DIAGNOSIS — L89893 Pressure ulcer of other site, stage 3: Secondary | ICD-10-CM | POA: Diagnosis not present

## 2016-07-22 NOTE — Telephone Encounter (Signed)
Signed on 2/8 4 PM

## 2016-07-25 DIAGNOSIS — I2510 Atherosclerotic heart disease of native coronary artery without angina pectoris: Secondary | ICD-10-CM | POA: Diagnosis not present

## 2016-07-25 DIAGNOSIS — J189 Pneumonia, unspecified organism: Secondary | ICD-10-CM | POA: Diagnosis not present

## 2016-07-25 DIAGNOSIS — I509 Heart failure, unspecified: Secondary | ICD-10-CM | POA: Diagnosis not present

## 2016-07-25 DIAGNOSIS — E785 Hyperlipidemia, unspecified: Secondary | ICD-10-CM | POA: Diagnosis not present

## 2016-07-25 DIAGNOSIS — R2689 Other abnormalities of gait and mobility: Secondary | ICD-10-CM | POA: Diagnosis not present

## 2016-07-25 DIAGNOSIS — E1122 Type 2 diabetes mellitus with diabetic chronic kidney disease: Secondary | ICD-10-CM | POA: Diagnosis not present

## 2016-07-25 DIAGNOSIS — J9620 Acute and chronic respiratory failure, unspecified whether with hypoxia or hypercapnia: Secondary | ICD-10-CM | POA: Diagnosis not present

## 2016-07-25 DIAGNOSIS — E119 Type 2 diabetes mellitus without complications: Secondary | ICD-10-CM | POA: Diagnosis not present

## 2016-07-25 DIAGNOSIS — J101 Influenza due to other identified influenza virus with other respiratory manifestations: Secondary | ICD-10-CM | POA: Diagnosis not present

## 2016-07-25 DIAGNOSIS — R278 Other lack of coordination: Secondary | ICD-10-CM | POA: Diagnosis not present

## 2016-07-25 DIAGNOSIS — R1311 Dysphagia, oral phase: Secondary | ICD-10-CM | POA: Diagnosis not present

## 2016-07-25 DIAGNOSIS — J111 Influenza due to unidentified influenza virus with other respiratory manifestations: Secondary | ICD-10-CM | POA: Diagnosis not present

## 2016-07-25 DIAGNOSIS — I4892 Unspecified atrial flutter: Secondary | ICD-10-CM | POA: Diagnosis not present

## 2016-07-25 DIAGNOSIS — I214 Non-ST elevation (NSTEMI) myocardial infarction: Secondary | ICD-10-CM | POA: Diagnosis not present

## 2016-07-25 DIAGNOSIS — I4891 Unspecified atrial fibrillation: Secondary | ICD-10-CM | POA: Diagnosis not present

## 2016-07-25 DIAGNOSIS — I10 Essential (primary) hypertension: Secondary | ICD-10-CM | POA: Diagnosis not present

## 2016-07-26 DIAGNOSIS — J9620 Acute and chronic respiratory failure, unspecified whether with hypoxia or hypercapnia: Secondary | ICD-10-CM | POA: Diagnosis not present

## 2016-07-26 DIAGNOSIS — I214 Non-ST elevation (NSTEMI) myocardial infarction: Secondary | ICD-10-CM | POA: Diagnosis not present

## 2016-07-26 DIAGNOSIS — J101 Influenza due to other identified influenza virus with other respiratory manifestations: Secondary | ICD-10-CM | POA: Diagnosis not present

## 2016-07-26 DIAGNOSIS — J189 Pneumonia, unspecified organism: Secondary | ICD-10-CM | POA: Diagnosis not present

## 2016-07-27 DIAGNOSIS — J111 Influenza due to unidentified influenza virus with other respiratory manifestations: Secondary | ICD-10-CM | POA: Diagnosis not present

## 2016-07-27 DIAGNOSIS — I509 Heart failure, unspecified: Secondary | ICD-10-CM | POA: Diagnosis not present

## 2016-07-27 DIAGNOSIS — I2510 Atherosclerotic heart disease of native coronary artery without angina pectoris: Secondary | ICD-10-CM | POA: Diagnosis not present

## 2016-07-27 DIAGNOSIS — I4891 Unspecified atrial fibrillation: Secondary | ICD-10-CM | POA: Diagnosis not present

## 2016-07-27 DIAGNOSIS — I10 Essential (primary) hypertension: Secondary | ICD-10-CM | POA: Diagnosis not present

## 2016-08-02 NOTE — Telephone Encounter (Signed)
Opened in error

## 2016-08-04 DIAGNOSIS — I214 Non-ST elevation (NSTEMI) myocardial infarction: Secondary | ICD-10-CM | POA: Diagnosis not present

## 2016-08-04 DIAGNOSIS — J9620 Acute and chronic respiratory failure, unspecified whether with hypoxia or hypercapnia: Secondary | ICD-10-CM | POA: Diagnosis not present

## 2016-08-04 DIAGNOSIS — I4891 Unspecified atrial fibrillation: Secondary | ICD-10-CM | POA: Diagnosis not present

## 2016-08-04 DIAGNOSIS — J189 Pneumonia, unspecified organism: Secondary | ICD-10-CM | POA: Diagnosis not present

## 2016-08-09 ENCOUNTER — Other Ambulatory Visit: Payer: Self-pay | Admitting: *Deleted

## 2016-08-09 DIAGNOSIS — I5022 Chronic systolic (congestive) heart failure: Secondary | ICD-10-CM

## 2016-08-09 DIAGNOSIS — E119 Type 2 diabetes mellitus without complications: Secondary | ICD-10-CM | POA: Diagnosis not present

## 2016-08-09 DIAGNOSIS — I4891 Unspecified atrial fibrillation: Secondary | ICD-10-CM | POA: Diagnosis not present

## 2016-08-09 DIAGNOSIS — I214 Non-ST elevation (NSTEMI) myocardial infarction: Secondary | ICD-10-CM | POA: Diagnosis not present

## 2016-08-09 DIAGNOSIS — J189 Pneumonia, unspecified organism: Secondary | ICD-10-CM | POA: Diagnosis not present

## 2016-08-09 NOTE — Addendum Note (Signed)
Addended by: Philis NettleNIEMCZURA, Riannon Mukherjee E on: 08/09/2016 04:23 PM   Modules accepted: Orders

## 2016-08-09 NOTE — Patient Outreach (Signed)
[facility] Care Management  08/09/2016  Jamie Mason 01/23/1941 [id number]   Met with SW at facility, patient tomorrow with home care services.   Met with patient at bedside reviewed [facility] care management services, patient familiar with [facility] care management, she would appreciate services again.  New consent obtained.  Plan to refer to [facility] care management and [facility] LCSW regarding transportation issues. Met with SW before leaving facility and let them know [facility] picking up patient, SW working on transportation for patient for discharge.  Royetta Crochet. Laymond Purser, RN, BSN, [place] [phone]) Business Cell  [phone]) Toll Free Office

## 2016-08-10 ENCOUNTER — Encounter: Payer: Self-pay | Admitting: *Deleted

## 2016-08-10 ENCOUNTER — Other Ambulatory Visit: Payer: Self-pay | Admitting: *Deleted

## 2016-08-10 NOTE — Patient Outreach (Signed)
[facility] Care Management  08/10/2016  Jamie NighGloria C Mason 08/03/1940 [id number]   CSW made an initial attempt to try and contact patient today to perform phone assessment, as well as assess and assist with social needs and services, without success.  A HIPAA complaint message was left for patient on voicemail.  CSW is currently awaiting a return call.  CSW will make a second outreach attempt in one week, if CSW does not receive a return call from patient in the meantime. [name], BSW, MSW, LCSW  Licensed Restaurant manager, fast foodClinical Social Worker  [facility] Care Management [place] [facility]  [place] [place], [place], [place] [address] Physical Address-[address], [place], [place] [address] Toll Free Main # [phone] Fax # [phone] Cell # [phone]  Office # [phone] [email or web address]

## 2016-08-11 ENCOUNTER — Telehealth: Payer: Self-pay | Admitting: Internal Medicine

## 2016-08-11 DIAGNOSIS — I214 Non-ST elevation (NSTEMI) myocardial infarction: Secondary | ICD-10-CM | POA: Diagnosis not present

## 2016-08-11 DIAGNOSIS — I4891 Unspecified atrial fibrillation: Secondary | ICD-10-CM | POA: Diagnosis not present

## 2016-08-11 DIAGNOSIS — I2510 Atherosclerotic heart disease of native coronary artery without angina pectoris: Secondary | ICD-10-CM | POA: Diagnosis not present

## 2016-08-11 DIAGNOSIS — L89322 Pressure ulcer of left buttock, stage 2: Secondary | ICD-10-CM | POA: Diagnosis not present

## 2016-08-11 DIAGNOSIS — I5030 Unspecified diastolic (congestive) heart failure: Secondary | ICD-10-CM | POA: Diagnosis not present

## 2016-08-11 DIAGNOSIS — J9610 Chronic respiratory failure, unspecified whether with hypoxia or hypercapnia: Secondary | ICD-10-CM | POA: Diagnosis not present

## 2016-08-11 DIAGNOSIS — I130 Hypertensive heart and chronic kidney disease with heart failure and stage 1 through stage 4 chronic kidney disease, or unspecified chronic kidney disease: Secondary | ICD-10-CM | POA: Diagnosis not present

## 2016-08-11 DIAGNOSIS — E1122 Type 2 diabetes mellitus with diabetic chronic kidney disease: Secondary | ICD-10-CM | POA: Diagnosis not present

## 2016-08-11 DIAGNOSIS — N189 Chronic kidney disease, unspecified: Secondary | ICD-10-CM | POA: Diagnosis not present

## 2016-08-11 NOTE — Telephone Encounter (Signed)
Verbal orders for home health

## 2016-08-12 ENCOUNTER — Other Ambulatory Visit: Payer: Self-pay | Admitting: *Deleted

## 2016-08-12 DIAGNOSIS — R2689 Other abnormalities of gait and mobility: Secondary | ICD-10-CM | POA: Diagnosis not present

## 2016-08-12 DIAGNOSIS — I2510 Atherosclerotic heart disease of native coronary artery without angina pectoris: Secondary | ICD-10-CM | POA: Diagnosis not present

## 2016-08-12 DIAGNOSIS — I5031 Acute diastolic (congestive) heart failure: Secondary | ICD-10-CM | POA: Diagnosis not present

## 2016-08-12 DIAGNOSIS — J9620 Acute and chronic respiratory failure, unspecified whether with hypoxia or hypercapnia: Secondary | ICD-10-CM | POA: Diagnosis not present

## 2016-08-12 DIAGNOSIS — L8990 Pressure ulcer of unspecified site, unspecified stage: Secondary | ICD-10-CM | POA: Diagnosis not present

## 2016-08-12 NOTE — Telephone Encounter (Signed)
yes

## 2016-08-12 NOTE — Patient Outreach (Signed)
[facility](THN) Care Management  08/12/2016  Jamie Mason 04/15/1941 [id number]   Referral received from post acute coordinator.  Member was recently hospitalized, 1/24-2/3, with diagnoses of pneumonia and influenza.  Per discharge summary, her hospitalization was complicated by atrial fibrillation and NSTEMI.  She was sent to rehab from hospital, discharged from rehab on 2/28.  Per chart, she also has history of hypertension, coronary artery disease, diabetes, hyperlipidemia and obesity.    Call placed to member, this care manager introduced self and purpose of call.  [facility] explained again as member was previously involved with services.  She agrees to involvement.  She report she has been doing "pretty good" since her discharge.  She denies pain or discomfort, report that she has started to receive home health services.  She denies having a follow up appointment scheduled with her primary MD, advised to call office to schedule.    She voices concern regarding transportation to MD office, stating that she usually rely on her daughter, but her vehicle is not working at the time.  She is made aware that social worker referral has been placed and her assigned LCSW will contact her within the next few day (scheduled outreach contact for Tuesday, 3/6).  LCSW is aware of transportation concern.  Member report that she is compliant with medications.  Patient was recently discharged from hospital and all medications have been reviewed.  She denies using a pill box, stating that her medications are on "cards, you can push them through."  She is open to using pill box once she is back to receiving pills in bottles.  She denies any questions at this time, confirmed that she has contact information for this care manager.  Agrees to home visit next week.  Will notify primary MD of involvement, will place in transition of care program.  Kemper DurieMonica Colbi Schiltz, RN, MSN [facility] Care  Management  [facility] [phone]

## 2016-08-12 NOTE — Telephone Encounter (Signed)
VO for 2x week for 60 days telemonitor VO L buttocks, stage 2, pinkish, aqua/ barrier cream, zinc, foam  Do you agree?

## 2016-08-13 DIAGNOSIS — E1122 Type 2 diabetes mellitus with diabetic chronic kidney disease: Secondary | ICD-10-CM | POA: Diagnosis not present

## 2016-08-13 DIAGNOSIS — J9610 Chronic respiratory failure, unspecified whether with hypoxia or hypercapnia: Secondary | ICD-10-CM | POA: Diagnosis not present

## 2016-08-13 DIAGNOSIS — I2510 Atherosclerotic heart disease of native coronary artery without angina pectoris: Secondary | ICD-10-CM | POA: Diagnosis not present

## 2016-08-13 DIAGNOSIS — N189 Chronic kidney disease, unspecified: Secondary | ICD-10-CM | POA: Diagnosis not present

## 2016-08-13 DIAGNOSIS — I5030 Unspecified diastolic (congestive) heart failure: Secondary | ICD-10-CM | POA: Diagnosis not present

## 2016-08-13 DIAGNOSIS — I214 Non-ST elevation (NSTEMI) myocardial infarction: Secondary | ICD-10-CM | POA: Diagnosis not present

## 2016-08-13 DIAGNOSIS — L8990 Pressure ulcer of unspecified site, unspecified stage: Secondary | ICD-10-CM | POA: Diagnosis not present

## 2016-08-13 DIAGNOSIS — I130 Hypertensive heart and chronic kidney disease with heart failure and stage 1 through stage 4 chronic kidney disease, or unspecified chronic kidney disease: Secondary | ICD-10-CM | POA: Diagnosis not present

## 2016-08-13 DIAGNOSIS — L89322 Pressure ulcer of left buttock, stage 2: Secondary | ICD-10-CM | POA: Diagnosis not present

## 2016-08-13 DIAGNOSIS — I4891 Unspecified atrial fibrillation: Secondary | ICD-10-CM | POA: Diagnosis not present

## 2016-08-13 DIAGNOSIS — J9620 Acute and chronic respiratory failure, unspecified whether with hypoxia or hypercapnia: Secondary | ICD-10-CM | POA: Diagnosis not present

## 2016-08-15 ENCOUNTER — Telehealth: Payer: Self-pay

## 2016-08-15 ENCOUNTER — Telehealth: Payer: Self-pay | Admitting: Internal Medicine

## 2016-08-15 NOTE — Telephone Encounter (Signed)
Needs verbal order for pt

## 2016-08-16 ENCOUNTER — Encounter: Payer: Self-pay | Admitting: *Deleted

## 2016-08-16 ENCOUNTER — Other Ambulatory Visit: Payer: Self-pay | Admitting: *Deleted

## 2016-08-16 DIAGNOSIS — I5030 Unspecified diastolic (congestive) heart failure: Secondary | ICD-10-CM | POA: Diagnosis not present

## 2016-08-16 DIAGNOSIS — E1122 Type 2 diabetes mellitus with diabetic chronic kidney disease: Secondary | ICD-10-CM | POA: Diagnosis not present

## 2016-08-16 DIAGNOSIS — I4891 Unspecified atrial fibrillation: Secondary | ICD-10-CM | POA: Diagnosis not present

## 2016-08-16 DIAGNOSIS — I2510 Atherosclerotic heart disease of native coronary artery without angina pectoris: Secondary | ICD-10-CM | POA: Diagnosis not present

## 2016-08-16 DIAGNOSIS — I214 Non-ST elevation (NSTEMI) myocardial infarction: Secondary | ICD-10-CM | POA: Diagnosis not present

## 2016-08-16 DIAGNOSIS — N189 Chronic kidney disease, unspecified: Secondary | ICD-10-CM | POA: Diagnosis not present

## 2016-08-16 DIAGNOSIS — J9610 Chronic respiratory failure, unspecified whether with hypoxia or hypercapnia: Secondary | ICD-10-CM | POA: Diagnosis not present

## 2016-08-16 DIAGNOSIS — I130 Hypertensive heart and chronic kidney disease with heart failure and stage 1 through stage 4 chronic kidney disease, or unspecified chronic kidney disease: Secondary | ICD-10-CM | POA: Diagnosis not present

## 2016-08-16 DIAGNOSIS — L89322 Pressure ulcer of left buttock, stage 2: Secondary | ICD-10-CM | POA: Diagnosis not present

## 2016-08-16 NOTE — Patient Outreach (Signed)
[facility]) Care Management   08/16/2016  ADRIANNAH STEINKAMP May 10, 1941 [id number]  ARLETHIA BASSO is an 76 y.o. female  Subjective:   Member reports that she is "doing alright."  She denies pain or shortness of breath at this time.  She does complain of some soreness and itching on her chest and the base of her scalp/neck (left side).  Report a few weeks ago she woke up with discomfort in her ear and the side of her left face/neck "crusted over."  She state that she reported it at the facility and was told to use Eucerin cream.  She report some relief in regards to the "crusting."    Objective:   Review of Systems  HENT: Negative.   Eyes: Negative.   Respiratory: Negative.   Cardiovascular: Positive for leg swelling.  Gastrointestinal: Negative.   Genitourinary: Negative.   Musculoskeletal: Negative.   Skin: Positive for rash.  Neurological: Positive for weakness.  Endo/Heme/Allergies: Negative.   Psychiatric/Behavioral: Negative.     Physical Exam  Constitutional: She is oriented to person, place, and time. She appears well-developed and well-nourished.  Neck: Normal range of motion.  Cardiovascular: Normal rate, regular rhythm and normal heart sounds.   Respiratory: Effort normal and breath sounds normal.  GI: Soft. Bowel sounds are normal.  Musculoskeletal: Normal range of motion.  Neurological: She is alert and oriented to person, place, and time.  Skin: Skin is warm and dry.   BP (!) 148/78 (BP Location: Right Arm, Patient Position: Sitting, Cuff Size: Normal)   Pulse 71   Resp 20   Ht 1.626 m ('5\' 4"'$ )   Wt 294 lb 8 oz (133.6 kg)   SpO2 98%   BMI 50.55 kg/m   Encounter Medications:   Outpatient Encounter Prescriptions as of 08/16/2016  Medication Sig  . albuterol (PROVENTIL HFA;VENTOLIN HFA) 108 (90 Base) MCG/ACT inhaler Inhale 2 puffs into the lungs every 6 (six) hours as needed for wheezing or shortness of breath.  Marland Kitchen amiodarone (PACERONE) 200 MG  tablet Take 2 tablets (400 mg total) by mouth 2 (two) times daily. 2 tabs twice daily x5 days. Then 1 tab twice daily x2 weeks. Then one tab daily.  Marland Kitchen amLODipine (NORVASC) 2.5 MG tablet Take 1 tablet (2.5 mg total) by mouth daily.  Marland Kitchen apixaban (ELIQUIS) 5 MG TABS tablet Take 1 tablet (5 mg total) by mouth 2 (two) times daily.  . ASPIR-LOW 81 MG EC tablet TAKE 1 TABLET EVERY DAY  . atorvastatin (LIPITOR) 40 MG tablet Take 1 tablet (40 mg total) by mouth daily.  . carvedilol (COREG) 25 MG tablet Take 1 tablet (25 mg total) by mouth 2 (two) times daily with a meal.  . furosemide (LASIX) 40 MG tablet Take 1 tablet (40 mg total) by mouth daily.  . hydrocerin (EUCERIN) CREA Apply to bilateral LEs after cleansing on Mondays and Thursdays (prior to [employer] application).  . metFORMIN (GLUCOPHAGE) 500 MG tablet TAKE 1 TABLET (500 MG TOTAL) BY MOUTH 2 (TWO) TIMES DAILY WITH A MEAL.   No facility-administered encounter medications on file as of 08/16/2016.     Functional Status:   In your present state of health, do you have any difficulty performing the following activities: 08/16/2016 07/07/2016  Hearing? N N  Vision? N N  Difficulty concentrating or making decisions? N N  Walking or climbing stairs? Y Y  Dressing or bathing? N N  Doing errands, shopping? N N  Preparing Food and eating ?  N -  Using the Toilet? N -  In the past six months, have you accidently leaked urine? N -  Do you have problems with loss of bowel control? N -  Managing your Medications? N -  Managing your Finances? N -  Housekeeping or managing your Housekeeping? Y -  Some recent data might be hidden    Fall/Depression Screening:    PHQ 2/9 Scores 08/16/2016 11/17/2015 07/23/2015 04/16/2015 12/11/2014 11/26/2014 10/22/2014  PHQ - 2 Score 1 1 0 0 0 0 0    Assessment:    Met with member at scheduled time.  Her daughter and husband are both in the home, not present during visit.  She report that she spoke to [place] earlier  this morning.  State that they discussed transportation issues.  Follow up appointment not scheduled, call placed to primary MD office.  Appointment scheduled for Friday, made aware of the need to contact Mrs. Saporito to secure transportation.    She reports compliance with medications.  Medications in the home reviewed compared to her medication list.  Old medications found, member denies using them as they are not on her current list.  Medications not being used taken from the home and will be discarded properly.    Member provided with new [facility] calendar.  She was provided with blood pressure monitor and scale by [facility], still has them both in the home (has to find her BP monitor).  She is advised to check her BP daily and record on log.  She report that she would also like to continue to work on her weight, however she does acknowledge that she is not compliant with her suggested diet.  She verbalizes understanding on proper diet, foods to eat and ones to avoid.    She denies any questions at this time.  Provided with contact information, advised to contact with concerns.  Plan:   Will follow up next week with weekly transition of care calls.  [facility] CM Care Plan Problem One   Flowsheet Row Most Recent Value  Care Plan Problem One  Risk for hospital admission related to respiratory failure as evidenced by recent hospitalization  Role Documenting the Problem One  Care Management [place] for Problem One  Active  THN Long Term Goal (31-90 days)  Member will not be readmitted to hospital within the next 31 days  THN Long Term Goal Start Date  08/12/16  Interventions for Problem One Long Term Goal  Discussed with member the importance of following discharge instructions, including follow up appointments, medications, diet, and compliance with home health involvement, to decrease the risk of readmission  THN CM Short Term Goal #1 (0-30 days)  Member will take medications as prescribed for  the next 4 weeks  THN CM Short Term Goal #1 Start Date  08/12/16  Interventions for Short Term Goal #1  Educated on the importance of taking medications as prescribed.  Medication list reviewed. Medication bottles physically reviewed, old/expired medications removed from the home.  THN CM Short Term Goal #2 (0-30 days)  Member will have follow up appointment within the next 2 weeks  THN CM Short Term Goal #2 Start Date  08/12/16  Interventions for Short Term Goal #2  Call placed to primary MD office to schedule follow up appointment (3/9).     Valente David, [place], MSN [place] [phone]

## 2016-08-16 NOTE — Patient Outreach (Signed)
[facility](THN) Care Management  08/16/2016  [name] 03/19/1941 [id number]   CSW was able to make initial contact with patient today to perform phone assessment, as well as assess and assist with social work needs and services.  CSW introduced self, explained role and types of services provided through [facility] Care Management [facility](THN Care Management).  CSW further explained to patient that CSW works with patient's RNCM, also with [facility] Care [facility], Kemper DurieMonica Lane. CSW then explained the reason for the call, indicating that Jamie Mason thought that patient would benefit from social work services and resources to assist with obtaining transportation services to and from physician appointments, as well as a referral for financial assistance to see if patient qualifies for housing repairs.  CSW obtained two HIPAA compliant identifiers from patient, which included patient's name and date of birth. Patient indicated that she desperately needs some repairs around her home, but admits that she is unable to afford any added expenses each month, as her and her husband, Jamie Mason are on a very fixed income.  Patient went on to say that these home repairs include: Replacement tiles in three separate sections of her roof (laundry room, kitchen and bathroom), as water is currently leaking through the ceiling. Replacement window in living room (allowing cold air to enter in the winter and warm air to seep through in the summer), significantly increasing their electricity bill. Replacement of front porch, which is a safety hazard.  Patient reported that the wooden boards are loose, warped and rotting, making the stairs un-passable.  Patient went on to say that the paint that was used on the front porch is full of lead, which will need to be wrapped, as opposed to scraped and repainted.  CSW agreed to make a referral for patient to [employer] to see if patient qualifies to  receive financial assistance through their agency.  CSW also provided patient with the contact information and a representative to call when checking the status of their application for eligibility. Patient also reported transportation being an issue for her with regards to getting to and from her physician appointments, resulting in her having to cancel several of [facility].  CSW spoke with patient about [employer] through her [employer] benefit, which patient was not aware of.  CSW agreed to make a referral for patient so that patient will be set up in their data base when patient calls to schedule her first ride.  Patient is aware that she is entitled to 12 one-way rides or 6 round-trip rides.  Patient was most appreciative of this information and agreed to being using [employer] for her upcoming physician appointments. CSW agreed to follow-up with patient in one week to ensure that patient received the packet of resource information that CSW will mail to patient's home today.  The packet of resource information will include all of the following: Consent Form for Treatment Welcome Packet to Jamie Mason, BSW, MSW, LCSW  Licensed Clinical Social Worker  [facility] [place] [facility]  [address]ailing [place], [place], [place] [address] Physical Address-[address], [place], [place] [address] Toll Free Main # [phone] Fax # [phone] Cell # [phone]  Office # [phone] Mardene [email or web address]

## 2016-08-16 NOTE — Patient Outreach (Signed)
[facility]N) Care Management  08/16/2016  Jamie Mason 08/16/1940 [id number]  Request received from [name], LCSW to mail patient transportation and housing resources.   [name], B.A.  [facility] Care Management Assistant

## 2016-08-19 ENCOUNTER — Encounter: Payer: Self-pay | Admitting: Internal Medicine

## 2016-08-19 ENCOUNTER — Ambulatory Visit (INDEPENDENT_AMBULATORY_CARE_PROVIDER_SITE_OTHER): Payer: Medicare HMO | Admitting: Internal Medicine

## 2016-08-19 VITALS — BP 174/81 | HR 73 | Wt 309.9 lb

## 2016-08-19 DIAGNOSIS — D72828 Other elevated white blood cell count: Secondary | ICD-10-CM

## 2016-08-19 DIAGNOSIS — Z0000 Encounter for general adult medical examination without abnormal findings: Secondary | ICD-10-CM

## 2016-08-19 DIAGNOSIS — B0229 Other postherpetic nervous system involvement: Secondary | ICD-10-CM | POA: Diagnosis not present

## 2016-08-19 DIAGNOSIS — D72829 Elevated white blood cell count, unspecified: Secondary | ICD-10-CM

## 2016-08-19 DIAGNOSIS — R6889 Other general symptoms and signs: Secondary | ICD-10-CM | POA: Diagnosis not present

## 2016-08-19 DIAGNOSIS — K5900 Constipation, unspecified: Secondary | ICD-10-CM

## 2016-08-19 DIAGNOSIS — I10 Essential (primary) hypertension: Secondary | ICD-10-CM

## 2016-08-19 DIAGNOSIS — D509 Iron deficiency anemia, unspecified: Secondary | ICD-10-CM | POA: Diagnosis not present

## 2016-08-19 LAB — GLUCOSE, CAPILLARY: Glucose-Capillary: 100 mg/dL — ABNORMAL HIGH (ref 65–99)

## 2016-08-19 MED ORDER — DOCUSATE SODIUM 100 MG PO CAPS: 100.0000 mg | ORAL_CAPSULE | Freq: Every day | ORAL | 1 refills | Status: DC | PRN

## 2016-08-19 MED ORDER — GABAPENTIN 100 MG PO CAPS: 100.0000 mg | ORAL_CAPSULE | Freq: Three times a day (TID) | ORAL | 2 refills | Status: DC

## 2016-08-19 NOTE — Progress Notes (Signed)
Internal Medicine Clinic Attending  Case discussed with Dr. Truong at the time of the visit.  We reviewed the resident's history and exam and pertinent patient test results.  I agree with the assessment, diagnosis, and plan of care documented in the resident's note.  

## 2016-08-19 NOTE — Assessment & Plan Note (Signed)
Assessment: 1 acid patient had any further concerns during this visit she requested something for constipation. She states that her normal bowel movement regimen is 2-3 times a week. Her last bowel movement was yesterday. Abdomen nontender and nondistended on exam.  Plan: Given Colace 100 mg to take up to twice daily when necessary for constipation.

## 2016-08-19 NOTE — Progress Notes (Signed)
   CC: rash  HPI:  Ms.Evanna C Fredric MareBailey is a 76 y.o. with PMHx as outlined below who presents to clinic for a rash. She was recently discharged from a skilled nursing facility last week. She states her rehab facility was [facility]. She lives with her daughter and her other daughter visits her everyday. She has home health PT that works with her twice a week. She is very happy to be home.   Past Medical History:  Diagnosis Date  . Atrial flutter with rapid ventricular response (HCC) 07/11/2016  . CAD (coronary artery disease) 05/04/2011  . Diabetes mellitus without complication (HCC)   . Hyperlipidemia   . Hypertension   . Lumbar disc disease   . Morbid Obesity 05/02/2011  . Non-ST elevation myocardial infarction (NSTEMI), initial episode of care [facility](HCC) 05/02/2011    Review of Systems:  Denies dysuria, chest pain, SOB, and fevers. Last BM was yesterday, her normal BM regimen is 2-3 times a week.   Physical Exam:  Vitals:   08/19/16 1452  BP: (!) 182/76  Pulse: 72  SpO2: 100%  Weight: (!) 309 lb 14.4 oz (140.6 kg)   Physical Exam  Constitutional: morbidly obese, foul odor to patient but appears well-developed and well-nourished. No distress.  HENT:  Head: Normocephalic and atraumatic.  Nose: Nose normal.  Cardiovascular: Normal rate, regular rhythm and normal heart sounds.  Exam reveals no gallop and no friction rub.   No murmur heard. Pulmonary/Chest: Effort normal and breath sounds normal. No respiratory distress.  has no wheezes.no rales.  Abdominal: Soft. Bowel sounds are normal.  exhibits no distension. There is no tenderness. There is no rebound and no guarding.  Neurological: alert and oriented to person, place, and time.  Skin: hyperpigmented rash over lower left ear towards left neck extending underneath chin and does not cross midline. There is no crusting or erythema.   Assessment & Plan:   See Encounters Tab for problem based charting.  Patient discussed with  Dr. Rogelia BogaButcher

## 2016-08-19 NOTE — Patient Instructions (Signed)
Start taking gabapentin 100mg  three times a day for your burning pain where your rash is.   You can take colace up to twice daily to help you have a bowel movement.   We will see you in 2 - 4 weeks for a blood pressure check. Please take all of your medications everyday.

## 2016-08-19 NOTE — Assessment & Plan Note (Addendum)
Assessment: Patient has a chronic leukocytosis dating back to 2016. She is afebrile today denies any dysuria or fevers at home. She has anemia of chronic disease but could also having underlying iron deficency.   Plan: checking CBC w/ diff and ferritin level.

## 2016-08-19 NOTE — Assessment & Plan Note (Signed)
Paperwork filled out for [facility] transportation.

## 2016-08-19 NOTE — Assessment & Plan Note (Signed)
Assessment: Patient states that with her rehabilitation facility she was given Eucerin cream to place over a rash  That was Located on her left ear that extends down towards the left side of her neck and underneath her chin, it does not cross midline. She states that she had lesions that crusted over. Currently she has a burning sensation where the rash is. Her rash is consistent with shingles that has resolved now with postherpetic neuralgia.  Plan: Rx for gabapentin 100 mg 3 times a day. Cautioned on drowsy side effects.

## 2016-08-19 NOTE — Assessment & Plan Note (Signed)
Assessment: Blood pressure elevated on arrival 182/76. Repeat blood pressure with systolic blood pressure 174. Patient did not take any of her medications today because she was worried about how she would get out of the house.  Plan: Follow-up in 2-4 weeks for blood pressure check.

## 2016-08-20 LAB — CBC WITH DIFFERENTIAL/PLATELET
BASOS: 0 %
Basophils Absolute: 0 10*3/uL (ref 0.0–0.2)
EOS (ABSOLUTE): 0.5 10*3/uL — ABNORMAL HIGH (ref 0.0–0.4)
EOS: 5 %
HEMATOCRIT: 33.2 % — AB (ref 34.0–46.6)
Hemoglobin: 10.3 g/dL — ABNORMAL LOW (ref 11.1–15.9)
Immature Grans (Abs): 0 10*3/uL (ref 0.0–0.1)
Immature Granulocytes: 0 %
LYMPHS ABS: 2.6 10*3/uL (ref 0.7–3.1)
Lymphs: 25 %
MCH: 27.3 pg (ref 26.6–33.0)
MCHC: 31 g/dL — AB (ref 31.5–35.7)
MCV: 88 fL (ref 79–97)
Monocytes Absolute: 0.8 10*3/uL (ref 0.1–0.9)
Monocytes: 7 %
Neutrophils Absolute: 6.5 10*3/uL (ref 1.4–7.0)
Neutrophils: 63 %
Platelets: 431 10*3/uL — ABNORMAL HIGH (ref 150–379)
RBC: 3.77 x10E6/uL (ref 3.77–5.28)
RDW: 15.7 % — AB (ref 12.3–15.4)
WBC: 10.4 10*3/uL (ref 3.4–10.8)

## 2016-08-20 LAB — FERRITIN: Ferritin: 142 ng/mL (ref 15–150)

## 2016-08-23 ENCOUNTER — Other Ambulatory Visit: Payer: Self-pay | Admitting: *Deleted

## 2016-08-23 NOTE — Patient Outreach (Signed)
[facility] Care Management  08/23/2016  Jamie NighGloria C Mason 11/15/1940 [id number]   Weekly transition of care call placed to member.  She report that she is doing well.  She confirms that she did attend her follow up appointment last week, and was told that she did have shingles.  She state that she was given a prescription for Gabapentin for the pain (per note, postherpetic neuralgia) but [facility].  She is hoping to have it by the end of the week.    She state that she has been progressing well.  Per MD note, her blood pressure was elevated during the office visit.  She state that she has found her blood pressure monitor, has not started checking daily.  Report that she will begin daily monitoring.  She inquires about medication management, particularly what to do after medications on cards are completed.  She is advised to review the medications, as was done during home visit, to see which ones she does not have extra in the pill bottles for.  She is advised to [facility] in order to have prior to running completely out.  She verbalizes understanding.  Member denies any further questions.  Will follow up next week.  Kemper DurieMonica Myrka Sylva, [place], MSN [facility] Care Management  [facility] Care Manager [phone]

## 2016-08-25 ENCOUNTER — Other Ambulatory Visit: Payer: Self-pay | Admitting: *Deleted

## 2016-08-25 NOTE — Patient Outreach (Signed)
[facility]) Care Management  08/25/2016  Jamie NighGloria C Mason 08/21/1940 [id number]   CSW made an attempt to try and contact patient today to assess and assist with social work needs and services, as well as ensure that patient received the packet of resource information mailed to her home, but patient was unavailable.  CSW left a HIPAA compliant message for patient on voicemail and is currently awaiting a return call.  If CSW does not receive a return call within the next week, CSW will make a second outreach attempt. [name], BSW, MSW, LCSW  Licensed Restaurant manager, fast foodClinical Social Worker  [facility] Care [facility] [place] [facility]  Mailing [place], [place], [place] [address] Physical Address-[address], [place], [place] [address] Toll Free Main # [phone] Fax # [phone] Cell # [phone]  Office # [phone] [email or web address]

## 2016-08-29 ENCOUNTER — Other Ambulatory Visit: Payer: Self-pay | Admitting: *Deleted

## 2016-08-29 NOTE — Patient Outreach (Signed)
[facility]) Care Management  08/29/2016  Jamie NighGloria C Mason 07/04/1940 [id number]   CSW received a voicemail message from patient, in response to the HIPAA compliant message that CSW left for patient on voicemail last week.  However, CSW was unavailable at the time of patient's call.  CSW attempted to return patient's call today; however, patient was unavailable.  A HIPAA compliant message was left for patient on voicemail and CSW is currently awaiting a return call.  CSW will make a second outreach attempt in one week, if CSW does not receive a return call from patient in the meantime. [name], BSW, MSW, LCSW  Licensed Restaurant manager, fast foodClinical Social Worker  [facility] Care Management [place] [facility]  Mailing [place], [place], [place] [address] Physical Address-[address], [place], [place] [address] Toll Free Main # [phone] Fax # [phone] Cell # [phone]  Office # [phone] [email or web address]

## 2016-08-30 ENCOUNTER — Other Ambulatory Visit: Payer: Self-pay | Admitting: *Deleted

## 2016-08-30 NOTE — Patient Outreach (Signed)
[facility]) Care Management  08/30/2016  Jamie NighGloria C Mason 01/13/1941 [id number]   Weekly transition of care call placed to member.  She report that she is doing well, still complain of some discomfort on the left side of her face/neck.  She state she has been compliant with all medications, including the newly prescribed gabapentin which she started about 5 days ago.  Member continue to deny symptoms of pneumonia, denies fever or shortness of breath.  Report that she has started to check her blood pressure daily and record, denies any readings above 155 systolic.    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  She state that there was a concern for bed bugs a few months ago, but the home was treated twice.  She also state that she has thrown out her mattresses, had her linen cleaned, and denies seeing any bugs since being back home from the facility.  She state she has continued to perform home exercises independently to increase her strength.  Member denies any questions/concerns at this time.  Will follow up next week with weekly transition of care call.  Kemper DurieMonica Alekzander [place], [place]RN, MSN [facility] Care Management  [facility] Care Manager [phone]

## 2016-09-01 ENCOUNTER — Other Ambulatory Visit: Payer: Self-pay | Admitting: *Deleted

## 2016-09-01 NOTE — Patient Outreach (Signed)
[facility]N) Care Management  09/01/2016  Arvella NighGloria C Kelemen 10/16/1940 [id number]   CSW was able to make contact with patient today, in response to her voicemail message, as well as to ensure that patient received a call from [employer] regarding housing repairs, and received the packet of resource information that CSW mailed to patient's home.  Patient claims that she did not receive the packet of resource information, mailed to her by Care Management Assistant, Sherle PoeNicole Robinson on March 6th, not has she received a call from Mrs. Ernie AvenaLinda Hopkins, Representative with [employer], referral made on March 6th.  CSW agreed to mail patient another packet of resource information, which will include all of the following information:   Consent Form for Treatment Welcome Packet to [facility] [employer] brochure [employer] brochure  CSW also agreed to contact Mrs. Hopkins to ensure that she received the initial referral made on patient on March 6th.  According to Mrs. Hopkins, the referral request that she received included the following home repairs: Replacement tiles in three separate sections of the roof (laundry room, kitchen and bathroom), as water is currently leaking through the ceiling. Replacement window in living room (allowing cold air to enter in the winter and warm air to seep through in the summer), significantly increasing their electricity bill. Replacement of front porch, which is a safety hazard.  Wooden boards are loose, warped and rotting, making the stairs un-passable.  Front porch paint is full of lead, which will need to be wrapped, as opposed to scraped and repainted. Mrs. Hopkins admitted that they have not been able to make contact with patient, despite several phone attempts.  CSW agreed to report this information back to patient, as well as provide patient with Mrs. Hopkins direct contact information.  Patient  will also need this number to check the status of her referral request.  CSW agreed to follow-up with patient in one week to ensure that she received the packet of resource information, mailed to her home again today, after verifying patient's home address.  CSW also wants to ensure that patient has received a call from Mrs. Hopkins with [employer] within the next week.  Patient voiced understanding and was agreeable to this plan. Danford BadJoanna Roseland Braun, BSW, MSW, LCSW  Licensed Restaurant manager, fast foodClinical Social Worker  [facility] [place] [facility]  [address] [place], [place], [place] [address] Physical Address-[address], [place], [place] [address] Toll Free Main # [phone] Fax # [phone] Cell # [phone]  Office # [phone] [email or web address]

## 2016-09-02 NOTE — Patient Outreach (Signed)
[facility]N) Care Management  09/02/2016  Arvella NighGloria C Lengel 08/23/1940 [id number]   Mailed:  Consent Form for Treatment  Welcome Packet to [facility]  [employer] brochure  [employer] brochure   Per [place]ikanJoanna Saporito, [place]

## 2016-09-05 ENCOUNTER — Other Ambulatory Visit: Payer: Self-pay | Admitting: *Deleted

## 2016-09-05 ENCOUNTER — Other Ambulatory Visit: Payer: Self-pay | Admitting: Internal Medicine

## 2016-09-05 NOTE — Telephone Encounter (Signed)
apixaban (ELIQUIS) 5 MG TABS tablet atorvastatin (LIPITOR) 40 MG tablet  [facility]

## 2016-09-05 NOTE — Patient Outreach (Signed)
[facility]N) Care Management  09/05/2016  Jamie NighGloria C Mason 09/13/1940 [id number]   Weekly transition of care call placed to member.  She report that she has been doing well, checking her blood pressure daily.  She denies any shortness of breath or fever.  She state she has been performing her exercises independently, and is progressing.  She report compliance with medications, state she is in the process of reordering refills that are needed.  This care manager reviewed medications telephonically.  She still has some medications on cards and some in bottles.  She state she will put the few remaining ones from the cards inside her bottles to decrease risk of confusion.  She report that she has all medications on her current list, will call in new refill for Eliquis and Atorvastatin.   She has another follow up appointment next week with her primary MD.  This care manager will follow up with weekly transition of care call, will follow up within 2 weeks with routine home visit.  Kemper DurieMonica Aaiden Depoy, CaliforniaRN, MSN [facility] Care [facility]  [facility] Care [facility] [phone]

## 2016-09-06 ENCOUNTER — Encounter: Payer: Self-pay | Admitting: Internal Medicine

## 2016-09-06 MED ORDER — APIXABAN 5 MG PO TABS: 5.0000 mg | ORAL_TABLET | Freq: Two times a day (BID) | ORAL | 3 refills | Status: DC

## 2016-09-06 MED ORDER — ATORVASTATIN CALCIUM 40 MG PO TABS: 40.0000 mg | ORAL_TABLET | Freq: Every day | ORAL | 3 refills | Status: DC

## 2016-09-08 ENCOUNTER — Other Ambulatory Visit: Payer: Self-pay | Admitting: *Deleted

## 2016-09-08 NOTE — Patient Outreach (Signed)
[facility]) Care Management  09/08/2016  Jamie NighGloria C Mason 05/02/1941 [id number]   CSW made an attempt to contact patient today to follow-up regarding social work services and resources, as well as ensure that patient received the packet of resource information mailed to her home; however, patient was unavailable.  A HIPAA compliant message was left for patient on voicemail and CSW is currently awaiting a return call.  CSW will make a second outreach attempt in one week if CSW does not receive a return call from patient in the meantime. [name], BSW, MSW, LCSW  Licensed Restaurant manager, fast foodClinical Social Worker  [facility] [place] [facility]  Mailing [place], [place], [place] [address] Physical Address-[address], [place], [place] [address] Toll Free Main # [phone] Fax # [phone] Cell # [phone]  Office # [phone] [email or web address]

## 2016-09-12 ENCOUNTER — Encounter (HOSPITAL_COMMUNITY): Payer: Self-pay | Admitting: Emergency Medicine

## 2016-09-12 ENCOUNTER — Other Ambulatory Visit: Payer: Self-pay | Admitting: *Deleted

## 2016-09-12 ENCOUNTER — Inpatient Hospital Stay (HOSPITAL_COMMUNITY)
Admission: EM | Admit: 2016-09-12 | Discharge: 2016-09-14 | DRG: 291 | Disposition: A | Discharge status: Home Health | Payer: Medicare HMO | Attending: Student in an Organized Health Care Education/Training Program | Admitting: Student in an Organized Health Care Education/Training Program

## 2016-09-12 ENCOUNTER — Emergency Department (HOSPITAL_COMMUNITY): Payer: Medicare HMO

## 2016-09-12 ENCOUNTER — Ambulatory Visit: Payer: Self-pay | Admitting: *Deleted

## 2016-09-12 ENCOUNTER — Inpatient Hospital Stay (HOSPITAL_COMMUNITY): Payer: Medicare HMO

## 2016-09-12 DIAGNOSIS — R0789 Other chest pain: Secondary | ICD-10-CM

## 2016-09-12 DIAGNOSIS — I130 Hypertensive heart and chronic kidney disease with heart failure and stage 1 through stage 4 chronic kidney disease, or unspecified chronic kidney disease: Principal | ICD-10-CM | POA: Diagnosis present

## 2016-09-12 DIAGNOSIS — Z87891 Personal history of nicotine dependence: Secondary | ICD-10-CM

## 2016-09-12 DIAGNOSIS — I110 Hypertensive heart disease with heart failure: Secondary | ICD-10-CM | POA: Diagnosis not present

## 2016-09-12 DIAGNOSIS — I872 Venous insufficiency (chronic) (peripheral): Secondary | ICD-10-CM | POA: Diagnosis not present

## 2016-09-12 DIAGNOSIS — N182 Chronic kidney disease, stage 2 (mild): Secondary | ICD-10-CM | POA: Diagnosis present

## 2016-09-12 DIAGNOSIS — J9600 Acute respiratory failure, unspecified whether with hypoxia or hypercapnia: Secondary | ICD-10-CM | POA: Diagnosis present

## 2016-09-12 DIAGNOSIS — J810 Acute pulmonary edema: Secondary | ICD-10-CM | POA: Diagnosis not present

## 2016-09-12 DIAGNOSIS — Z7901 Long term (current) use of anticoagulants: Secondary | ICD-10-CM | POA: Diagnosis not present

## 2016-09-12 DIAGNOSIS — I2510 Atherosclerotic heart disease of native coronary artery without angina pectoris: Secondary | ICD-10-CM | POA: Diagnosis not present

## 2016-09-12 DIAGNOSIS — R0602 Shortness of breath: Secondary | ICD-10-CM | POA: Diagnosis not present

## 2016-09-12 DIAGNOSIS — E785 Hyperlipidemia, unspecified: Secondary | ICD-10-CM | POA: Diagnosis not present

## 2016-09-12 DIAGNOSIS — E669 Obesity, unspecified: Secondary | ICD-10-CM | POA: Diagnosis not present

## 2016-09-12 DIAGNOSIS — I440 Atrioventricular block, first degree: Secondary | ICD-10-CM | POA: Diagnosis present

## 2016-09-12 DIAGNOSIS — L8990 Pressure ulcer of unspecified site, unspecified stage: Secondary | ICD-10-CM | POA: Diagnosis not present

## 2016-09-12 DIAGNOSIS — Z7982 Long term (current) use of aspirin: Secondary | ICD-10-CM | POA: Diagnosis not present

## 2016-09-12 DIAGNOSIS — I252 Old myocardial infarction: Secondary | ICD-10-CM | POA: Diagnosis not present

## 2016-09-12 DIAGNOSIS — I4581 Long QT syndrome: Secondary | ICD-10-CM | POA: Diagnosis present

## 2016-09-12 DIAGNOSIS — Z79899 Other long term (current) drug therapy: Secondary | ICD-10-CM | POA: Diagnosis not present

## 2016-09-12 DIAGNOSIS — Z880 Allergy status to penicillin: Secondary | ICD-10-CM | POA: Diagnosis not present

## 2016-09-12 DIAGNOSIS — R0902 Hypoxemia: Secondary | ICD-10-CM

## 2016-09-12 DIAGNOSIS — E1122 Type 2 diabetes mellitus with diabetic chronic kidney disease: Secondary | ICD-10-CM | POA: Diagnosis not present

## 2016-09-12 DIAGNOSIS — R0603 Acute respiratory distress: Secondary | ICD-10-CM | POA: Diagnosis present

## 2016-09-12 DIAGNOSIS — I2582 Chronic total occlusion of coronary artery: Secondary | ICD-10-CM | POA: Diagnosis present

## 2016-09-12 DIAGNOSIS — M4646 Discitis, unspecified, lumbar region: Secondary | ICD-10-CM | POA: Diagnosis present

## 2016-09-12 DIAGNOSIS — E119 Type 2 diabetes mellitus without complications: Secondary | ICD-10-CM | POA: Diagnosis not present

## 2016-09-12 DIAGNOSIS — Z6841 Body Mass Index (BMI) 40.0 and over, adult: Secondary | ICD-10-CM

## 2016-09-12 DIAGNOSIS — J9601 Acute respiratory failure with hypoxia: Secondary | ICD-10-CM | POA: Diagnosis not present

## 2016-09-12 DIAGNOSIS — E872 Acidosis: Secondary | ICD-10-CM

## 2016-09-12 DIAGNOSIS — I10 Essential (primary) hypertension: Secondary | ICD-10-CM | POA: Diagnosis present

## 2016-09-12 DIAGNOSIS — J9620 Acute and chronic respiratory failure, unspecified whether with hypoxia or hypercapnia: Secondary | ICD-10-CM | POA: Diagnosis not present

## 2016-09-12 DIAGNOSIS — G4733 Obstructive sleep apnea (adult) (pediatric): Secondary | ICD-10-CM | POA: Diagnosis present

## 2016-09-12 DIAGNOSIS — I472 Ventricular tachycardia: Secondary | ICD-10-CM | POA: Diagnosis not present

## 2016-09-12 DIAGNOSIS — Z7984 Long term (current) use of oral hypoglycemic drugs: Secondary | ICD-10-CM

## 2016-09-12 DIAGNOSIS — I5033 Acute on chronic diastolic (congestive) heart failure: Secondary | ICD-10-CM

## 2016-09-12 DIAGNOSIS — I517 Cardiomegaly: Secondary | ICD-10-CM | POA: Diagnosis not present

## 2016-09-12 DIAGNOSIS — N179 Acute kidney failure, unspecified: Secondary | ICD-10-CM | POA: Diagnosis not present

## 2016-09-12 DIAGNOSIS — E1165 Type 2 diabetes mellitus with hyperglycemia: Secondary | ICD-10-CM | POA: Diagnosis present

## 2016-09-12 DIAGNOSIS — Z888 Allergy status to other drugs, medicaments and biological substances status: Secondary | ICD-10-CM

## 2016-09-12 DIAGNOSIS — Z809 Family history of malignant neoplasm, unspecified: Secondary | ICD-10-CM

## 2016-09-12 DIAGNOSIS — I4892 Unspecified atrial flutter: Secondary | ICD-10-CM | POA: Diagnosis not present

## 2016-09-12 DIAGNOSIS — Z823 Family history of stroke: Secondary | ICD-10-CM

## 2016-09-12 LAB — COMPREHENSIVE METABOLIC PANEL
ALT: 13 U/L — ABNORMAL LOW (ref 14–54)
ANION GAP: 12 (ref 5–15)
AST: 21 U/L (ref 15–41)
Albumin: 3.3 g/dL — ABNORMAL LOW (ref 3.5–5.0)
Alkaline Phosphatase: 87 U/L (ref 38–126)
BUN: 20 mg/dL (ref 6–20)
CALCIUM: 8.8 mg/dL — AB (ref 8.9–10.3)
CHLORIDE: 103 mmol/L (ref 101–111)
CO2: 22 mmol/L (ref 22–32)
CREATININE: 1.49 mg/dL — AB (ref 0.44–1.00)
GFR, EST AFRICAN AMERICAN: 39 mL/min — AB (ref >60)
GFR, EST NON AFRICAN AMERICAN: 34 mL/min — AB (ref >60)
Glucose, Bld: 253 mg/dL — ABNORMAL HIGH (ref 65–99)
Potassium: 4.2 mmol/L (ref 3.5–5.1)
SODIUM: 137 mmol/L (ref 135–145)
Total Bilirubin: 0.6 mg/dL (ref 0.3–1.2)
Total Protein: 7.6 g/dL (ref 6.5–8.1)

## 2016-09-12 LAB — TROPONIN I
Troponin I: 0.08 ng/mL (ref <0.03)
Troponin I: 0.08 ng/mL (ref <0.03)

## 2016-09-12 LAB — CBC WITH DIFFERENTIAL/PLATELET
BASOS PCT: 0 %
Basophils Absolute: 0 10*3/uL (ref 0.0–0.1)
EOS ABS: 0.3 10*3/uL (ref 0.0–0.7)
Eosinophils Relative: 3 %
HEMATOCRIT: 32.4 % — AB (ref 36.0–46.0)
HEMOGLOBIN: 10 g/dL — AB (ref 12.0–15.0)
Lymphocytes Relative: 43 %
Lymphs Abs: 4.4 10*3/uL — ABNORMAL HIGH (ref 0.7–4.0)
MCH: 27.1 pg (ref 26.0–34.0)
MCHC: 30.9 g/dL (ref 30.0–36.0)
MCV: 87.8 fL (ref 78.0–100.0)
MONOS PCT: 7 %
Monocytes Absolute: 0.8 10*3/uL (ref 0.1–1.0)
NEUTROS ABS: 4.9 10*3/uL (ref 1.7–7.7)
NEUTROS PCT: 47 %
Platelets: 386 10*3/uL (ref 150–400)
RBC: 3.69 MIL/uL — AB (ref 3.87–5.11)
RDW: 17.8 % — ABNORMAL HIGH (ref 11.5–15.5)
WBC: 10.4 10*3/uL (ref 4.0–10.5)

## 2016-09-12 LAB — I-STAT ARTERIAL BLOOD GAS, ED
ACID-BASE EXCESS: 1 mmol/L (ref 0.0–2.0)
BICARBONATE: 26 mmol/L (ref 20.0–28.0)
O2 SAT: 99 %
TCO2: 27 mmol/L (ref 0–100)
pCO2 arterial: 42.6 mmHg (ref 32.0–48.0)
pH, Arterial: 7.393 (ref 7.350–7.450)
pO2, Arterial: 139 mmHg — ABNORMAL HIGH (ref 83.0–108.0)

## 2016-09-12 LAB — INFLUENZA PANEL BY PCR (TYPE A & B)
INFLAPCR: NEGATIVE
INFLBPCR: NEGATIVE

## 2016-09-12 LAB — I-STAT CG4 LACTIC ACID, ED: LACTIC ACID, VENOUS: 3.39 mmol/L — AB (ref 0.5–1.9)

## 2016-09-12 LAB — I-STAT TROPONIN, ED: Troponin i, poc: 0.03 ng/mL (ref 0.00–0.08)

## 2016-09-12 LAB — CBG MONITORING, ED
Glucose-Capillary: 166 mg/dL — ABNORMAL HIGH (ref 65–99)
Glucose-Capillary: 231 mg/dL — ABNORMAL HIGH (ref 65–99)

## 2016-09-12 LAB — LACTIC ACID, PLASMA: Lactic Acid, Venous: 1.8 mmol/L (ref 0.5–1.9)

## 2016-09-12 LAB — GLUCOSE, CAPILLARY: GLUCOSE-CAPILLARY: 176 mg/dL — AB (ref 65–99)

## 2016-09-12 LAB — BRAIN NATRIURETIC PEPTIDE: B NATRIURETIC PEPTIDE 5: 592.8 pg/mL — AB (ref 0.0–100.0)

## 2016-09-12 LAB — TSH: TSH: 2.329 u[IU]/mL (ref 0.350–4.500)

## 2016-09-12 MED ORDER — INSULIN ASPART 100 UNIT/ML ~~LOC~~ SOLN
0.0000 [IU] | SUBCUTANEOUS | Status: DC
  Administered 2016-09-12 (×2): 2 [IU] via SUBCUTANEOUS
  Administered 2016-09-12 – 2016-09-13 (×2): 3 [IU] via SUBCUTANEOUS
  Administered 2016-09-13: 2 [IU] via SUBCUTANEOUS
  Administered 2016-09-13 – 2016-09-14 (×5): 1 [IU] via SUBCUTANEOUS
  Filled 2016-09-12 (×2): qty 1

## 2016-09-12 MED ORDER — AMIODARONE HCL 200 MG PO TABS
200.0000 mg | ORAL_TABLET | Freq: Every day | ORAL | Status: DC
  Administered 2016-09-12 – 2016-09-14 (×3): 200 mg via ORAL
  Filled 2016-09-12 (×3): qty 1

## 2016-09-12 MED ORDER — NITROGLYCERIN 0.4 MG SL SUBL
0.4000 mg | SUBLINGUAL_TABLET | SUBLINGUAL | Status: DC | PRN
  Administered 2016-09-12: 0.4 mg via SUBLINGUAL

## 2016-09-12 MED ORDER — FUROSEMIDE 10 MG/ML IJ SOLN
60.0000 mg | Freq: Two times a day (BID) | INTRAMUSCULAR | Status: DC
  Administered 2016-09-12 – 2016-09-14 (×4): 60 mg via INTRAVENOUS
  Filled 2016-09-12 (×4): qty 6

## 2016-09-12 MED ORDER — ATORVASTATIN CALCIUM 40 MG PO TABS
40.0000 mg | ORAL_TABLET | Freq: Every day | ORAL | Status: DC
  Administered 2016-09-13 – 2016-09-14 (×2): 40 mg via ORAL
  Filled 2016-09-12 (×2): qty 1

## 2016-09-12 MED ORDER — FUROSEMIDE 10 MG/ML IJ SOLN
40.0000 mg | Freq: Every day | INTRAMUSCULAR | Status: DC
Start: 2016-09-13 — End: 2016-09-12
  Filled 2016-09-12: qty 4

## 2016-09-12 MED ORDER — SODIUM CHLORIDE 0.9% FLUSH
3.0000 mL | Freq: Two times a day (BID) | INTRAVENOUS | Status: DC
  Administered 2016-09-12 – 2016-09-14 (×5): 3 mL via INTRAVENOUS

## 2016-09-12 MED ORDER — ASPIRIN EC 81 MG PO TBEC
81.0000 mg | DELAYED_RELEASE_TABLET | Freq: Every day | ORAL | Status: DC
  Administered 2016-09-12 – 2016-09-14 (×3): 81 mg via ORAL
  Filled 2016-09-12 (×3): qty 1

## 2016-09-12 MED ORDER — FUROSEMIDE 10 MG/ML IJ SOLN
40.0000 mg | Freq: Once | INTRAMUSCULAR | Status: AC
  Administered 2016-09-12: 40 mg via INTRAVENOUS
  Filled 2016-09-12: qty 4

## 2016-09-12 MED ORDER — APIXABAN 5 MG PO TABS
5.0000 mg | ORAL_TABLET | Freq: Two times a day (BID) | ORAL | Status: DC
  Administered 2016-09-12 – 2016-09-14 (×4): 5 mg via ORAL
  Filled 2016-09-12 (×6): qty 1

## 2016-09-12 MED ORDER — DOCUSATE SODIUM 100 MG PO CAPS: 100.0000 mg | ORAL_CAPSULE | Freq: Every day | ORAL | Status: DC | PRN

## 2016-09-12 MED ORDER — ALBUTEROL SULFATE (2.5 MG/3ML) 0.083% IN NEBU: 2.5000 mg | INHALATION_SOLUTION | RESPIRATORY_TRACT | Status: DC | PRN

## 2016-09-12 MED ORDER — ASPIRIN 81 MG PO TBEC: 81.0000 mg | DELAYED_RELEASE_TABLET | Freq: Every day | ORAL | Status: DC

## 2016-09-12 MED ORDER — HYDRALAZINE HCL 20 MG/ML IJ SOLN: 5.0000 mg | Freq: Four times a day (QID) | INTRAMUSCULAR | Status: DC | PRN

## 2016-09-12 MED ORDER — AMLODIPINE BESYLATE 2.5 MG PO TABS
2.5000 mg | ORAL_TABLET | Freq: Every day | ORAL | Status: DC
  Administered 2016-09-12 – 2016-09-14 (×3): 2.5 mg via ORAL
  Filled 2016-09-12 (×3): qty 1

## 2016-09-12 MED ORDER — CARVEDILOL 25 MG PO TABS
25.0000 mg | ORAL_TABLET | Freq: Two times a day (BID) | ORAL | Status: DC
  Administered 2016-09-12 – 2016-09-14 (×5): 25 mg via ORAL
  Filled 2016-09-12: qty 1
  Filled 2016-09-12: qty 2
  Filled 2016-09-12 (×3): qty 1

## 2016-09-12 MED ORDER — ALBUTEROL SULFATE (2.5 MG/3ML) 0.083% IN NEBU
10.0000 mg | INHALATION_SOLUTION | Freq: Once | RESPIRATORY_TRACT | Status: AC
  Administered 2016-09-12: 10 mg via RESPIRATORY_TRACT

## 2016-09-12 NOTE — H&P (Signed)
Date: 09/12/2016               Patient Name:  Jamie Mason MRN: [id number]  DOB: June 07, 1941 Age / Sex: 76 y.o.,  female   PCP: Delan Ksiazek Spain, MD         Medical Service: Internal Medicine Teaching Service         Attending Physician: Dr. Tyson Alias, MD    First Contact: Dr. Peggyann Juba Pager: [phone]  Second Contact: Dr. Johnny Bridge Pager: [phone]       After Hours (After 5p/  First Contact Pager: [phone]  weekends / holidays): Second Contact Pager: [phone]   Chief Complaint: "couldn't breath"  History of Present Illness: [name] is a 76 year old female with past medical history of type 2 diabetes (A1c 7.3), CAD (LHC 1/18 total occlusion mid RCA, mod disease distal LMCA, LAD, LCx), hyperlipidemia, hypertension, obesity, chronic venous insufficiency, and history of atrial flutter status post cardioversion (07/2016) who presents to the emergency department with acute complaint of progressive shortness of breath. Patient states that for the last 2-3 weeks she has experienced intermittent shortness of breath and chest tightness. However, since 11 AM yesterday patient has had constant mid sternum chest pressure associated with shortness of breath. Shortness of breath continued to become progressively worse throughout the night and patient was unable to sleep. She also admits to a mild nonproductive cough and mild wheezing which started yesterday as well. The pressure is nonradiating and not painful. She cannot describe what makes the pressure sensation worse, but states it feels like something is "blocking me on the inside as if there is something inside me." She tried to use her albuterol inhaler at home without much relief. Early this morning around 4 or 5 AM, patient felt that her shortness of breath was getting worse and decided to call EMS. On arrival, EMS found her hypoxic in the 70s. Patient was placed on BiPAP and given Solu-Medrol 125 mg once, magnesium 2 g, Atrovent,  and 2 nitroglycerin. She states that the chest pressure and shortness of breath was not relieved at that time.  On arrival to the emergency department, patient was afebrile, hypertensive at 188/95, heart rate 99, tachypneic at 26, satting 100% on BiPAP. Patient was given an additional albuterol nebulizer treatment and an additional nitroglycerin. Patient states her chest pressure and shortness of breath began to improve while on BiPAP. Currently, patient denies any complaint other than dry mouth. Her chest pressure is relieved.  Patient denies associated fever, chills, diaphoresis, nausea, vomiting, diarrhea, constipation, abdominal pain, chest pain, orthopnea, weight gain, weight loss, headache, dysuria, increased urinary frequency. She has been compliant with all of her medications, but ran out of Eliquis and atorvastatin 2 or 3 days ago. She denies recent change in diet or intake.   Of note, patient was recently admitted from 1/24- 2/3/8 for complicated influenza pneumonia, atrial flutter status post cardioversion and NSTEMI.  Meds: Current Facility-Administered Medications  Medication Dose Route Frequency Provider Last Rate Last Dose  . albuterol (PROVENTIL) (2.5 MG/3ML) 0.083% nebulizer solution 2.5 mg  2.5 mg Inhalation Q2H PRN Cisco Kindt Lucrezia Starch, MD      . amiodarone (PACERONE) tablet 200 mg  200 mg Oral Daily Indonesia Mckeough R Tannia Contino, MD      . amLODipine (NORVASC) tablet 2.5 mg  2.5 mg Oral Daily Kiannah Grunow R Haisley Arens, MD      . apixaban (ELIQUIS) tablet 5 mg  5 mg Oral BID Servando Snare, MD      .  aspirin EC tablet 81 mg  81 mg Oral Daily Stepheni Cameron Lucrezia Starch, MD      . atorvastatin (LIPITOR) tablet 40 mg  40 mg Oral Daily Miguelangel Korn R Sandara Tyree, MD      . carvedilol (COREG) tablet 25 mg  25 mg Oral BID WC Lajada Janes R Lester Crickenberger, MD      . docusate sodium (COLACE) capsule 100 mg  100 mg Oral Daily PRN Sharnita Bogucki Lucrezia Starch, MD      . furosemide (LASIX) injection 40 mg  40 mg Intravenous Once Jaimon Bugaj Lucrezia Starch, MD      . Melene Muller ON 09/13/2016]  furosemide (LASIX) injection 40 mg  40 mg Intravenous Daily Carita Sollars R Claudio Mondry, MD      . insulin aspart (novoLOG) injection 0-9 Units  0-9 Units Subcutaneous Q4H Asharia Lotter R Dajana Gehrig, MD      . nitroGLYCERIN (NITROSTAT) SL tablet 0.4 mg  0.4 mg Sublingual Q5 min PRN Maia Plan, MD   0.4 mg at 09/12/16 0524  . sodium chloride flush (NS) 0.9 % injection 3 mL  3 mL Intravenous Q12H Adriana Lina Lucrezia Starch, MD       Current Outpatient Prescriptions  Medication Sig Dispense Refill  . albuterol (PROVENTIL HFA;VENTOLIN HFA) 108 (90 Base) MCG/ACT inhaler Inhale 2 puffs into the lungs every 6 (six) hours as needed for wheezing or shortness of breath. 3 Inhaler 3  . amiodarone (PACERONE) 200 MG tablet Take 2 tablets (400 mg total) by mouth 2 (two) times daily. 2 tabs twice daily x5 days. Then 1 tab twice daily x2 weeks. Then one tab daily. 60 tablet 2  . amLODipine (NORVASC) 2.5 MG tablet Take 1 tablet (2.5 mg total) by mouth daily. 30 tablet 2  . apixaban (ELIQUIS) 5 MG TABS tablet Take 1 tablet (5 mg total) by mouth 2 (two) times daily. 180 tablet 3  . ASPIR-LOW 81 MG EC tablet TAKE 1 TABLET EVERY DAY 90 tablet 3  . atorvastatin (LIPITOR) 40 MG tablet Take 1 tablet (40 mg total) by mouth daily. 90 tablet 3  . carvedilol (COREG) 25 MG tablet Take 1 tablet (25 mg total) by mouth 2 (two) times daily with a meal. 60 tablet 2  . docusate sodium (COLACE) 100 MG capsule Take 1 capsule (100 mg total) by mouth daily as needed. (Patient taking differently: Take 100 mg by mouth daily as needed for mild constipation. ) 30 capsule 1  . furosemide (LASIX) 40 MG tablet Take 1 tablet (40 mg total) by mouth daily. 30 tablet 2  . gabapentin (NEURONTIN) 100 MG capsule Take 1 capsule (100 mg total) by mouth 3 (three) times daily. 90 capsule 2  . hydrocerin (EUCERIN) CREA Apply to bilateral LEs after cleansing on Mondays and Thursdays (prior to [employer] application). 113 g 0  . metFORMIN (GLUCOPHAGE) 500 MG tablet TAKE 1 TABLET (500 MG TOTAL)  BY MOUTH 2 (TWO) TIMES DAILY WITH A MEAL. 180 tablet 3    Allergies: Allergies as of 09/12/2016 - Review Complete 09/12/2016  Allergen Reaction Noted  . Lisinopril Swelling 08/19/2014  . Ace inhibitors Other (See Comments) 08/19/2014  . Penicillins Itching and Rash 05/02/2011   Past Medical History:  Diagnosis Date  . Atrial flutter with rapid ventricular response (HCC) 07/11/2016  . CAD (coronary artery disease) 05/04/2011  . Diabetes mellitus without complication (HCC)   . Hyperlipidemia   . Hypertension   . Lumbar disc disease   . Morbid Obesity 05/02/2011  . Non-ST elevation myocardial infarction (NSTEMI),  initial episode of care [facility]) 05/02/2011    Family History: Mother: CVA Father: Cancer  Social History:  Tobacco Use: Quit 6 years ago (previous 30 pack year history) Alcohol Use: Denies Illicit Drug Use: Denies  Review of Systems: A complete ROS was reviewed and negative except as per HPI.   Physical Exam: Blood pressure (!) 164/66, pulse 72, temperature 98.6 F (37 C), resp. rate 17, SpO2 100 %. General: Vital signs reviewed.  Patient is on BiPAP, in no acute distress and cooperative with exam.  Eyes: PERRL, conjunctivae normal, no scleral icterus.  Ears, Nose, Throat, and Mouth: On BiPAP.  Cardiovascular: RRR,  no murmurs, gallops, or rubs. No obvious JVD. No carotid bruit present. 1-2+ woody pitting lower extremity edema bilaterally. Bilateral radial and pedal pulses are intact and symmetric bilaterally.  Pulmonary: Mild inspiratory fine rales in lower lung fields, no wheezing or coarse rhonchi. No accessory muscle use. Gastrointestinal: Soft, obese, non-tender, non-distended, BS +, no guarding present.  Neurologic: Awake, alert, oriented. Moving all extremities. No focal deficit.  Skin: Evidence of previous herpes-zoster rash on upper right chest, shoulder, and neck- healed. Psychiatric: Normal mood and affect. speech and behavior is normal. Cognition and memory  are grossly normal.   EKG: Sinus rhythm, first degree AV block, TWI in aVL, no evidence of acute ST/Twave changes to indicate ischemia.  CXR: I have independently reviewed this film. Bilateral hazy opacities and interstitial edema. Study is only one view.  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 Casasola is a 76 year old female with past medical history of type 2 diabetes (A1c 7.3), CAD (LHC 1/18 total occlusion mid RCA, mod disease distal LMCA, LAD, LCx), hyperlipidemia, hypertension, obesity, chronic venous insufficiency, and history of atrial flutter status post cardioversion (07/2016) who presents to the emergency department with acute complaint of progressive shortness of breath.  Acute Hypoxic Respiratory Failure: Patient presents with progressive dyspnea and chest pressure starting yesterday afternoon. She was found to be hypoxic in the 70s and improved on BiPAP. Dyspnea and chest pressure are associated with mild cough and fine rales on exam. There is no evidence of acute coronary syndrome with a normal troponin and EKG without acute ischemia. BNP elevated at 592.8. Chest x-ray with bilateral interstitial edema. Her acute hypoxic respiratory failure is likely secondary to acute pulmonary edema from elevated blood pressure versus volume overload. I doubt pulmonary embolism given progressive onset and clinical picture. I doubt infectious etiology, but we will re-check for influenza given cough and acute onset. Patient has no official diagnosis of COPD or asthma, but is on an albuterol inhaler at home. Pulmonary function testing from 2015 was nonobstructive. Currently, her shortness of breath is  improved and chest pressure resolved. I will repeat a 2 view chest x-ray and diuresis with Lasix 40 mg IV once given evidence of volume overload on exam. We will monitor her closely for evidence of acute coronary syndrome and control her blood pressure. -Admit to stepdown unit -Continue BiPAP when necessary -Lasix 40 mg IV once, resume oral Lasix based on clinical response -Repeat 2 view chest x-ray -Albuterol every 2 hours when necessary -Daily weights -Strict I's and O's -Continue carvedilol  25 mg twice a day, aspirin 81 mg daily, amlodipine 2.5 mg daily -Continue Eliquis 5 mg twice a day -Trend troponins and repeat EKG tomorrow morning -Check influenza  CAD with h/o NSTEMI: During previous hospitalization, patient had an NSTEMI secondary to demand ischemia. Left heart catheterization patient revealed chronic total occlusion of mid RCA, moderate disease of LMCA, LAD, left circumflex. Recommendations were to continue medical therapy of elevated troponin and manage atrial fibrillation with rapid ventricular response, and aggressively diurese given elevated left ventricular filling pressure. Patient reports compliance with her aspirin 81 mg daily at home, carvedilol 25 mg twice a day, and atorvastatin 40 mg daily. Given patient's complaints of chest pressure and shortness of breath, ACS remains on the differential. However, pressure is now resolved, troponin negative and EKG is without acute ischemic changes. -Trend troponins -Repeat EKG tomorrow morning -Telemetry -Continue aspirin 81 mg daily -Continue carvedilol 25 mg twice a day -Continue atorvastatin 40 mg daily  History of atrial flutter s/p cardioversion: During hospitalization in January 2018, patient experienced atrial flutter with RVR. This may have been secondary to acute illness from influenza. Patient's rates are difficult to control and she went for a DCCV on 07/07/2016. D CV was successful and patient remained in sinus rhythm. She  remains on amiodarone 200 mg daily at home which she reports compliance with. EKG on admission shows sinus tachycardia with heart rate 93. Patient was also discharged on Eliquis 5 mg twice a day. She states she ran out of this medication 2-3 days ago. Discharge summary from previous hospitalization indicates patient may not need to remain on anticoagulation indefinitely if atrial flutter with secondary to underlying acute illness. -Telemetry -Continue Eliquis 5 mg twice a day -We'll need to determine need for ongoing anticoagulation -Continue amiodarone 200 mg milligrams daily -Check TSH given recent start to amiodarone   AKI: Creatinine on admission 1.49. Baseline creatinine appears to be 1.1-1.2. -Repeat basic metabolic panel tomorrow morning  Lactic acidosis: Lactic acid 3.39 on admission, likely secondary to hypoxemia. -Repeat lactic acid  Type 2 diabetes: Recent hemoglobin A1c 7.3 in January 2018. Patient is on metformin 500 mg twice a day at home. Given mild AKI and hyperglycemia on admission, will hold metformin and start sliding scale insulin. -SSI-S  Hypertension: Patient was hypertensive on admission, 188/95. She denies history of uncontrolled blood pressure at home. This could be contributory to her pulmonary edema. Blood pressure is now improved. We'll resume home medications. -Continue home amlodipine 2.5 mg daily -Continue carvedilol 25 mg twice a day  Chronic HFpEF: Recent echocardiogram from January 2018 revealed EF of 60-65%, grade 2 diastolic dysfunction. Patient is on Lasix 40 mg once a day at home. She reports compliance. Weight is slightly increased from hospital discharge in February 2018. Weight on admission is 309 pounds, discharge weight in February 2018 was 302 pounds. -Lasix 40 mg IV once, resume oral Lasix depending on clinical response -Continue carvedilol 25 mg twice a day -Continue amlodipine 2.5 mg daily  HLD: Patient is on atorvastatin 40 mg daily at home.  She ran out of this medication 2-3 days ago. -Continue atorvastatin 40 mg daily  DVT/PE ppx: Eliquis FEN: NPO on BiPAP CODE: FULL   Dispo: Admit patient to Inpatient with expected length of stay greater than 2 midnights.  Signed: Karlene Lineman, DO PGY-3 Internal Medicine Resident Pager # [phone] 09/12/2016 9:09 AM

## 2016-09-12 NOTE — ED Notes (Signed)
Pt titrated down to 4 L [place] per admitting md, patient tolerating well, will continue to monitor

## 2016-09-12 NOTE — Progress Notes (Signed)
Patient on 2L [place] and tolerating well with no respiratory distress noted. BIPAP is not needed at this time. RT will monitor as needed.

## 2016-09-12 NOTE — ED Provider Notes (Signed)
Emergency Department Provider Note   I have reviewed the triage vital signs and the nursing notes.  Level 5 caveat: Severe Respiratory Distress  HISTORY  Chief Complaint Respiratory Distress   HPI Jamie Mason is a 76 y.o. female with PMH of CAD, DM, HLD, HTN, and a-flutter presents to the emergency department for evaluation of severe difficulty breathing. Patient states that symptoms have been building but suddenly got significantly worse. She denies chest pain, fever, chills, productive cough. Because of acute respiratory distress the patient has difficulty providing additional history.  EMS states that they arrived on scene with severe distress, wheezing, and hypoxemia. She received 15 mg of albuterol in route along with solumedrol, magnesium, nitro x 2, and CPAP. Patient was hypoxic in the field to the 70s.    Past Medical History:  Diagnosis Date  . Atrial flutter with rapid ventricular response (HCC) 07/11/2016  . CAD (coronary artery disease) 05/04/2011  . Diabetes mellitus without complication (HCC)   . Hyperlipidemia   . Hypertension   . Lumbar disc disease   . Morbid Obesity 05/02/2011  . Non-ST elevation myocardial infarction (NSTEMI), initial episode of care [facility]) 05/02/2011    Patient Active Problem List   Diagnosis Date Noted  . Acute respiratory failure with hypoxia (HCC) 09/12/2016  . History of atrial flutter 09/12/2016  . Post herpetic neuralgia 08/19/2016  . Atrial flutter (HCC) 07/11/2016  . Chronic venous insufficiency 06/30/2016  . Goals of care, counseling/discussion 07/24/2015  . Severe obesity (BMI >= 40) (HCC) 08/28/2014  . 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 Dyspnea on exertion 03/19/2014  . Healthcare maintenance 03/19/2014  . CAD (coronary artery disease) 05/03/2011  . Hypertension 05/02/2011  . Hyperlipidemia 05/02/2011  . Lumbar disc disease 05/02/2011    Past Surgical History:    Procedure Laterality Date  . CARDIAC CATHETERIZATION N/A 07/11/2016   Procedure: Left Heart Cath and Coronary Angiography;  Surgeon: [name], MD;  Location: [facility] INVASIVE CV LAB;  Service: Cardiovascular;  Laterality: N/A;  . CARDIOVERSION N/A 07/13/2016   Procedure: CARDIOVERSION;  Surgeon: Wendall Stade, MD;  Location: [facility] ENDOSCOPY;  Service: Cardiovascular;  Laterality: N/A;  . LEFT HEART CATHETERIZATION WITH CORONARY ANGIOGRAM N/A 05/03/2011   Procedure: LEFT HEART CATHETERIZATION WITH CORONARY ANGIOGRAM;  Surgeon: Othella Boyer, MD;  Location: [facility] CATH LAB;  Service: Cardiovascular;  Laterality: N/A;  . none      Current Outpatient Rx  . Order #: [id number] Class: Normal  . Order #: [id number] Class: Normal  . Order #: [id number] Class: Normal  . Order #: [id number] Class: Normal  . Order #: [id number] Class: Normal  . Order #: [id number] Class: Normal  . Order #: [id number] Class: Normal  . Order #: [id number] Class: Normal  . Order #: [id number] Class: Normal  . Order #: [id number] Class: Normal  . Order #: [id number] Class: Normal  . Order #: [id number] Class: Normal    Allergies Lisinopril; Ace inhibitors; and Penicillins  Family History  Problem Relation Age of Onset  . Cancer Father   . Stroke Mother     Social History Social History  Substance Use Topics  . Smoking status: Former Smoker    Packs/day: 0.50    Years: 50.00    Types: Cigarettes    Quit date: 04/01/2011  . Smokeless tobacco: Never Used  . Alcohol use No    Review of Systems  Level 5 caveat: Severe Respiratory Distress  ____________________________________________   PHYSICAL EXAM:  VITAL SIGNS: ED Triage Vitals  Enc Vitals Group     BP 09/12/16 0523 (!) 188/95     Pulse Rate 09/12/16 0523 99     Resp 09/12/16 0523 (!) 26     Temp 09/12/16 0523 98.6 F (37 C)     Temp src --      SpO2 09/12/16 0518 96 %   Constitutional: Alert but in severe respiratory distress. Sitting upright with tachypnea.   Eyes: Conjunctivae are normal.  Head: Atraumatic. Nose: No congestion/rhinnorhea. Mouth/Throat: Mucous membranes are moist.  Neck: No stridor.   Cardiovascular: Normal rate, regular rhythm. Good peripheral circulation. Grossly normal heart sounds.   Respiratory: Significant respiratory effort.  No retractions. Lungs with wet, rales throughout and diminished at the bases.  Gastrointestinal: Soft and nontender. No distention.  Musculoskeletal: No lower extremity tenderness nor edema. No gross deformities of extremities. Neurologic:  Normal speech and language. No gross focal neurologic deficits are appreciated.  Skin:  Skin is warm, dry and intact. No rash noted.   ____________________________________________   LABS (all labs ordered are listed, but only abnormal results are displayed)  Labs Reviewed  COMPREHENSIVE METABOLIC PANEL - Abnormal; Notable for the following:       Result Value   Glucose, Bld 253 (*)    Creatinine, Ser 1.49 (*)    Calcium 8.8 (*)    Albumin 3.3 (*)    ALT 13 (*)    GFR calc non Af Amer 34 (*)    GFR calc Af Amer 39 (*)    All other components within normal limits  CBC WITH DIFFERENTIAL/PLATELET - Abnormal; Notable for the following:    RBC 3.69 (*)    Hemoglobin 10.0 (*)    HCT 32.4 (*)    RDW 17.8 (*)    Lymphs Abs 4.4 (*)    All other components within normal limits  BRAIN NATRIURETIC PEPTIDE - Abnormal; Notable for the following:    B Natriuretic Peptide 592.8 (*)    All other components within normal limits  TROPONIN I - Abnormal; Notable for the following:    Troponin I 0.08 (*)    All other components within normal limits  I-STAT CG4 LACTIC ACID, ED - Abnormal; Notable for the following:    Lactic Acid, Venous 3.39 (*)    All other components within normal limits  CBG MONITORING, ED - Abnormal; Notable for the following:    Glucose-Capillary 166 (*)    All other components within normal limits  I-STAT ARTERIAL BLOOD GAS, ED - Abnormal;  Notable for the following:    pO2, Arterial 139.0 (*)    All other components within normal limits  LACTIC ACID, PLASMA  TSH  TROPONIN I  INFLUENZA PANEL BY PCR (TYPE A & B)  HIV ANTIBODY (ROUTINE TESTING)  BLOOD GAS, ARTERIAL  I-STAT TROPOININ, ED   ____________________________________________  EKG   EKG Interpretation  Date/Time:  Monday September 12 2016 05:34:24 EDT Ventricular Rate:  93 PR Interval:    QRS Duration: 97 QT Interval:  391 QTC Calculation: 487 R Axis:   74 Text Interpretation:  Sinus rhythm Prolonged PR interval Borderline repolarization abnormality Borderline prolonged QT interval No STEMI.  Confirmed by Egor Fullilove MD, Adrianne Shackleton [phone]) on 09/12/2016 5:48:33 AM Also confirmed by Avy Barlett MD, Ludie Pavlik [phone]), editor [place]  CCT, [place] ([id number])  on 09/12/2016 6:53:42 AM       ____________________________________________  RADIOLOGY  Dg Chest Portable 1 View  Result Date: 09/12/2016 CLINICAL DATA:  Shortness of breath, wheezing EXAM: PORTABLE CHEST  1 VIEW COMPARISON:  09/12/2016 FINDINGS: Cardiomediastinal silhouette is stable. Persistent mild interstitial prominence bilaterally and hazy airspace disease especially in lower lobes suspicious for mild pulmonary edema. There is slight improvement in aeration in upper lobes. IMPRESSION: Persistent mild interstitial prominence bilaterally and hazy airspace disease especially in lower lobes suspicious for mild pulmonary edema. There is slight improvement in aeration in upper lobes. Electronically Signed   By: Natasha Mead M.D.   On: 09/12/2016 08:35   Dg Chest Portable 1 View  Result Date: 09/12/2016 CLINICAL DATA:  Dyspnea tonight EXAM: PORTABLE CHEST 1 VIEW COMPARISON:  07/09/2016 FINDINGS: Moderate cardiomegaly, unchanged. Central and basilar airspace opacities. Moderate vascular and interstitial prominence. No large effusions. IMPRESSION: Cardiomegaly with interstitial and alveolar opacities, likely congestive heart failure.  Electronically Signed   By: Ellery Plunk M.D.   On: 09/12/2016 05:41    ____________________________________________   PROCEDURES  Procedure(s) performed:   Procedures  CRITICAL CARE Performed by: Maia Plan Total critical care time: 50 minutes Critical care time was exclusive of separately billable procedures and treating other patients. Critical care was necessary to treat or prevent imminent or life-threatening deterioration. Critical care was time spent personally by me on the following activities: development of treatment plan with patient and/or surrogate as well as nursing, discussions with consultants, evaluation of patient's response to treatment, examination of patient, obtaining history from patient or surrogate, ordering and performing treatments and interventions, ordering and review of laboratory studies, ordering and review of radiographic studies, pulse oximetry and re-evaluation of patient's condition.  Alona Bene, MD Emergency Medicine  ____________________________________________   INITIAL IMPRESSION / ASSESSMENT AND PLAN / ED COURSE  Pertinent labs & imaging results that were available during my care of the patient were reviewed by me and considered in my medical decision making (see chart for details).  Patient presents to the ED in severe respiratory distress with hypoxemia and significant WOB. Transitioned to BiPAP and SL nitro given for presumed flash pulmonary edema. Will monitor closely. Patient able to nod that she would want intubation if necessary. Will give trial or BiPAP first.   05:30 AM Patient with improved WOB and reports feeling better on BiPAP. BP down-trending from field vitals nicely. EKG with some baseline wander. Will repeat now that patient is more calm.   06:12 AM Patient continues to look well. Saturating well and speaking in 4-5 word sentences. Normal mental status. Continue BiPAP. BP staying down without nitro drip. Will  discuss admission with the hospitalist.   Discussed patient's case with IM teaching team. Patient and family (if present) updated with plan. Care transferred to IM teaching service.  I reviewed all nursing notes, vitals, pertinent old records, EKGs, labs, imaging (as available).   ____________________________________________  FINAL CLINICAL IMPRESSION(S) / ED DIAGNOSES  Final diagnoses:  Respiratory distress  Acute pulmonary edema (HCC)  Hypoxia  Acute respiratory distress     MEDICATIONS GIVEN DURING THIS VISIT:  Medications  nitroGLYCERIN (NITROSTAT) SL tablet 0.4 mg (0.4 mg Sublingual Given 09/12/16 0524)  apixaban (ELIQUIS) tablet 5 mg (0 mg Oral Hold 09/12/16 1231)  atorvastatin (LIPITOR) tablet 40 mg (0 mg Oral Hold 09/12/16 1222)  docusate sodium (COLACE) capsule 100 mg (not administered)  amiodarone (PACERONE) tablet 200 mg (200 mg Oral Given 09/12/16 1103)  amLODipine (NORVASC) tablet 2.5 mg (2.5 mg Oral Given 09/12/16 1103)  carvedilol (COREG) tablet 25 mg (25 mg Oral Given 09/12/16 1103)  albuterol (PROVENTIL) (2.5 MG/3ML) 0.083% nebulizer solution 2.5 mg (not administered)  insulin aspart (novoLOG) injection 0-9 Units (0 Units Subcutaneous Not Given 09/12/16 1222)  sodium chloride flush (NS) 0.9 % injection 3 mL (3 mLs Intravenous Given 09/12/16 1109)  aspirin EC tablet 81 mg (81 mg Oral Given 09/12/16 1103)  furosemide (LASIX) injection 40 mg (not administered)  hydrALAZINE (APRESOLINE) injection 5 mg (not administered)  albuterol (PROVENTIL) (2.5 MG/3ML) 0.083% nebulizer solution 10 mg (10 mg Nebulization Given 09/12/16 0557)  furosemide (LASIX) injection 40 mg (40 mg Intravenous Given 09/12/16 1100)     NEW OUTPATIENT MEDICATIONS STARTED DURING THIS VISIT:  None   Note:  This document was prepared using Dragon voice recognition software and may include unintentional dictation errors.  Alona Bene, MD Emergency Medicine   Maia Plan, MD 09/12/16 [phone]

## 2016-09-12 NOTE — Patient Outreach (Signed)
[facility]) Care Management  09/12/2016  Jamie Mason [birth year] [id number]   Preparing to contact member for weekly transition of care call, noted that she is currently in the ED for respiratory distress.  Per notes, she will be admitted.  Will notify hospital liaisons regarding admission.  Will follow up and restart transition of care program once discharged.  [name], [place], MSN [facility] Care Management  [facility] Manager [phone]

## 2016-09-12 NOTE — ED Notes (Signed)
Admitting md paged regarding patients condition and if the patient still needs step down bed

## 2016-09-12 NOTE — ED Notes (Signed)
Pt will now receive tele bed order per admitting doctor

## 2016-09-12 NOTE — ED Triage Notes (Signed)
Pt arrives via EMS for shortness of breath that has gotten worse over the past day. Pt in acute resp distress. Arrived on CPAP. 125 solumedrol, 2g mag,  albuterol,  atrovent, 2 Nitro

## 2016-09-13 DIAGNOSIS — J810 Acute pulmonary edema: Secondary | ICD-10-CM | POA: Diagnosis present

## 2016-09-13 DIAGNOSIS — E119 Type 2 diabetes mellitus without complications: Secondary | ICD-10-CM

## 2016-09-13 LAB — CBC
HEMATOCRIT: 28.4 % — AB (ref 36.0–46.0)
Hemoglobin: 9.2 g/dL — ABNORMAL LOW (ref 12.0–15.0)
MCH: 28 pg (ref 26.0–34.0)
MCHC: 32.4 g/dL (ref 30.0–36.0)
MCV: 86.3 fL (ref 78.0–100.0)
PLATELETS: 347 10*3/uL (ref 150–400)
RBC: 3.29 MIL/uL — AB (ref 3.87–5.11)
RDW: 17.5 % — ABNORMAL HIGH (ref 11.5–15.5)
WBC: 11.6 10*3/uL — AB (ref 4.0–10.5)

## 2016-09-13 LAB — BASIC METABOLIC PANEL
ANION GAP: 11 (ref 5–15)
BUN: 21 mg/dL — AB (ref 6–20)
CO2: 27 mmol/L (ref 22–32)
Calcium: 9.2 mg/dL (ref 8.9–10.3)
Chloride: 103 mmol/L (ref 101–111)
Creatinine, Ser: 1.19 mg/dL — ABNORMAL HIGH (ref 0.44–1.00)
GFR, EST AFRICAN AMERICAN: 51 mL/min — AB (ref >60)
GFR, EST NON AFRICAN AMERICAN: 44 mL/min — AB (ref >60)
Glucose, Bld: 147 mg/dL — ABNORMAL HIGH (ref 65–99)
POTASSIUM: 3.8 mmol/L (ref 3.5–5.1)
SODIUM: 141 mmol/L (ref 135–145)

## 2016-09-13 LAB — GLUCOSE, CAPILLARY
GLUCOSE-CAPILLARY: 148 mg/dL — AB (ref 65–99)
GLUCOSE-CAPILLARY: 131 mg/dL — AB (ref 65–99)
GLUCOSE-CAPILLARY: 156 mg/dL — AB (ref 65–99)
Glucose-Capillary: 131 mg/dL — ABNORMAL HIGH (ref 65–99)
Glucose-Capillary: 143 mg/dL — ABNORMAL HIGH (ref 65–99)
Glucose-Capillary: 161 mg/dL — ABNORMAL HIGH (ref 65–99)

## 2016-09-13 LAB — MAGNESIUM: Magnesium: 2.1 mg/dL (ref 1.7–2.4)

## 2016-09-13 LAB — HIV ANTIBODY (ROUTINE TESTING W REFLEX): HIV SCREEN 4TH GENERATION: NONREACTIVE

## 2016-09-13 MED ORDER — DIPHENHYDRAMINE HCL 25 MG PO CAPS: 25.0000 mg | ORAL_CAPSULE | Freq: Four times a day (QID) | ORAL | Status: DC | PRN

## 2016-09-13 MED ORDER — POTASSIUM CHLORIDE CRYS ER 20 MEQ PO TBCR
40.0000 meq | EXTENDED_RELEASE_TABLET | Freq: Once | ORAL | Status: AC
  Administered 2016-09-13: 40 meq via ORAL
  Filled 2016-09-13: qty 2

## 2016-09-13 NOTE — Progress Notes (Signed)
Internal Medicine Attending:   I saw and examined the patient. I reviewed the resident's note and I agree with the resident's findings and plan as documented in the resident's note.  76 year old woman hospital day #2 with acute on chronic heart failure with preserved ejection fraction. She is feeling much better so far with diuresis and supportive care. Weight is down from 311 lbs to 307 lbs today on Lasix  IV bid. GFR improved with diuresis so far. I would like to continue IV diuresis and push weight down below 300 lbs if renal function allows. No need to repeat echo. Continue carvedilol, aspirin, atorvastatin, amlodipine, apixaban, and amiodarone.

## 2016-09-13 NOTE — Consult Note (Signed)
   [facility] CM Inpatient Consult   09/13/2016  Jamie Mason [birth year] [id number]   Patient is active with [name] with [name] and [name]. Chart review reveals the patient is a 76 y.o. female with HFpEF, CAD, DM who presented with acutely worsening dyspnea and chest pressure suggestive of CHF exacerbation.  She has ruled out for MI with low troponins and flat trend, but has an abnormal EKG.  Her volume status is challenging to assess clinically due to her hypervolemia, but her BNP was elevated, CXR may show some edema, and recent cath showed increased filling pressures, and she is improving with diuresis.   Met with the patient and sister at the bedside.  Patient states she endorses ongoing [place] management for post hospital follow up.  Patient states she the only medications she had not gotten filled was her blood thinner and she thinks a statin since she left rehab. Patient will have follow up calls for transition and ongoing care management.  Updated inpatient RNCM, [name] of [place] Management involvement.  [facility] Care Management does not replace or interfere with any post hospital needs.    Natividad Brood, RN BSN [facility] Liaison  [phone] business mobile phone Toll free office [phone]

## 2016-09-13 NOTE — Progress Notes (Signed)
Subjective: Breathing much better today, lay flat on her side overnight without orthopnea.  Denies chest or epigastric pain or pressure.  Has been urinating frequently.  Objective:  Vital signs in last 24 hours: Vitals:   09/12/16 1852 09/12/16 1854 09/12/16 2113 09/13/16 0429  BP: (!) 142/50 (!) 129/48 (!) 142/45 (!) 123/57  Pulse: 64 63 (!) 58 62  Resp: Temp: 97.9 F (36.6 C)  98.3 F (36.8 C) 98.2 F (36.8 C)  SpO2: 100%  100% 98%  Weight: (!) 311 lb 6.4 oz (141.3 kg)   (!) 307 lb 11.2 oz (139.6 kg)  Height:  (1.651 m)      Physical Exam  Constitutional: She is oriented to person, place, and time.  Morbidly obese woman lying flat in bed with nasal canula in place in no distress  Neck:  Unable to assess JVD due to obesity  Cardiovascular:  Distant heart sounds Regular rate and rhythm 2/6 systolic murmur  Pulmonary/Chest: Effort normal. No respiratory distress. She has no wheezes. She has no rales.  Breath sounds reduced throughout  Abdominal: Soft. She exhibits no distension. There is no tenderness.  Neurological: She is alert and oriented to person, place, and time.  Psychiatric: She has a normal mood and affect. Her behavior is normal.   CBC Latest Ref Rng & Units 09/13/2016 09/12/2016 08/19/2016  WBC 4.0 - 10.5 K/uL 11.6(H) 10.4 10.4  Hemoglobin 12.0 - 15.0 g/dL 1.6(X) 10.0(L) -  Hematocrit 36.0 - 46.0 % 28.4(L) 32.4(L) 33.2(L)  Platelets 150 - 400 K/uL 347 386 431(H)   CMP Latest Ref Rng & Units 09/13/2016 09/12/2016 07/15/2016  Glucose 65 - 99 mg/dL 096(E) 454(U) 981(X)  BUN 6 - 20 mg/dL 91(Y) 20 78(G)  Creatinine 0.44 - 1.00 mg/dL 9.56(O) 1.30(Q) 6.57  Sodium 135 - 145 mmol/L 141 137 137  Potassium 3.5 - 5.1 mmol/L 3.8 4.2 3.9  Chloride 101 - 111 mmol/L 103 103 99(L)  CO2 22 - 32 mmol/L Calcium 8.9 - 10.3 mg/dL 9.2 8.4(O) 9.6(E)  Total Protein 6.5 - 8.1 g/dL - 7.6 -  Total Bilirubin 0.3 - 1.2 mg/dL - 0.6 -  Alkaline Phos 38 - 126 U/L - 87 -    AST 15 - 41 U/L - 21 -  ALT 14 - 54 U/L - 13(L) -   Cardiac Panel (last 3 results)  Recent Labs  09/12/16 1143 09/12/16 1739  TROPONINI 0.08* 0.08*   EKG 09/13/2016 RRR, pAC vs supraventricular beat, anterolateral TWI new since admission  Assessment/Plan:  Principal Problem:   Acute exacerbation of CHF (congestive heart failure) (HCC) Active Problems:   Hypertension   Hyperlipidemia   CAD (coronary artery disease)   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 female with HFpEF, CAD, DM who presented with acutely worsening dyspnea and chest pressure suggestive of CHF exacerbation.  She has ruled out for MI with low troponins and flat trend, but has an abnormal EKG.  Her volume status is challenging to assess clinically due to her hypervolemia, but her BNP was elevated, CXR may show some edema, and recent cath showed increased filling pressures, and she is improving with diuresis.  #HFpEF #CHF Exacerbation -IV lasix 60 mg BID -continue home carvedilol 25 mg BID, amlodipine 2.5 mg daily -strict I/Os -daily weights -  telemetry -Follow BMP, replete K and Mg as needed  #CAD -repeat EKG -continue home atorvastatin, carvedilol, aspirin  #History of AFlutter -Continue home amiodarone, carvedilol, apixaban -Telemetry -Follow BMP, replete K and Mg as needed  #DM SSI  Fluids: none Diet: heart/carb DVT Prophylaxis: apixaban Code Status: full  Dispo: Anticipated discharge in approximately 2-3 day(s).   Alm Bustard, MD 09/13/2016, 6:25 AM Pager: [phone]

## 2016-09-14 ENCOUNTER — Telehealth: Payer: Self-pay | Admitting: Internal Medicine

## 2016-09-14 DIAGNOSIS — I130 Hypertensive heart and chronic kidney disease with heart failure and stage 1 through stage 4 chronic kidney disease, or unspecified chronic kidney disease: Principal | ICD-10-CM

## 2016-09-14 DIAGNOSIS — E1122 Type 2 diabetes mellitus with diabetic chronic kidney disease: Secondary | ICD-10-CM

## 2016-09-14 DIAGNOSIS — I872 Venous insufficiency (chronic) (peripheral): Secondary | ICD-10-CM

## 2016-09-14 DIAGNOSIS — N182 Chronic kidney disease, stage 2 (mild): Secondary | ICD-10-CM

## 2016-09-14 LAB — BASIC METABOLIC PANEL
Anion gap: 9 (ref 5–15)
BUN: 30 mg/dL — AB (ref 6–20)
CHLORIDE: 101 mmol/L (ref 101–111)
CO2: 29 mmol/L (ref 22–32)
Calcium: 8.9 mg/dL (ref 8.9–10.3)
Creatinine, Ser: 1.41 mg/dL — ABNORMAL HIGH (ref 0.44–1.00)
GFR calc Af Amer: 42 mL/min — ABNORMAL LOW (ref >60)
GFR calc non Af Amer: 36 mL/min — ABNORMAL LOW (ref >60)
Glucose, Bld: 110 mg/dL — ABNORMAL HIGH (ref 65–99)
POTASSIUM: 3.8 mmol/L (ref 3.5–5.1)
Sodium: 139 mmol/L (ref 135–145)

## 2016-09-14 LAB — GLUCOSE, CAPILLARY
GLUCOSE-CAPILLARY: 106 mg/dL — AB (ref 65–99)
Glucose-Capillary: 121 mg/dL — ABNORMAL HIGH (ref 65–99)
Glucose-Capillary: 110 mg/dL — ABNORMAL HIGH (ref 65–99)

## 2016-09-14 LAB — MAGNESIUM: MAGNESIUM: 1.9 mg/dL (ref 1.7–2.4)

## 2016-09-14 MED ORDER — ACETAMINOPHEN 325 MG PO TABS
650.0000 mg | ORAL_TABLET | Freq: Four times a day (QID) | ORAL | Status: DC | PRN
  Administered 2016-09-14: 650 mg via ORAL
  Filled 2016-09-14: qty 2

## 2016-09-14 MED ORDER — DIPHENHYDRAMINE HCL 25 MG PO CAPS
50.0000 mg | ORAL_CAPSULE | Freq: Four times a day (QID) | ORAL | Status: DC | PRN
Start: 2016-09-13 — End: 2016-09-14

## 2016-09-14 MED ORDER — FUROSEMIDE 40 MG PO TABS: 80.0000 mg | ORAL_TABLET | Freq: Every day | ORAL | 2 refills | Status: DC

## 2016-09-14 MED ORDER — SENNOSIDES-DOCUSATE SODIUM 8.6-50 MG PO TABS
2.0000 | ORAL_TABLET | Freq: Once | ORAL | Status: AC
  Administered 2016-09-14: 2 via ORAL
  Filled 2016-09-14: qty 2

## 2016-09-14 MED ORDER — DIPHENHYDRAMINE HCL 25 MG PO CAPS
50.0000 mg | ORAL_CAPSULE | Freq: Once | ORAL | Status: AC
  Administered 2016-09-14: 50 mg via ORAL
  Filled 2016-09-14: qty 2

## 2016-09-14 MED ORDER — FUROSEMIDE 80 MG PO TABS: 80.0000 mg | ORAL_TABLET | Freq: Every day | ORAL | Status: DC

## 2016-09-14 MED ORDER — FUROSEMIDE 40 MG PO TABS: 40.0000 mg | ORAL_TABLET | Freq: Two times a day (BID) | ORAL | Status: DC

## 2016-09-14 NOTE — Progress Notes (Signed)
Jamie Mason to be D/C'd Home per MD order.  Discussed with the patient and all questions fully answered.  VSS, Skin clean, dry and intact without evidence of skin break down, no evidence of skin tears noted. IV catheter discontinued intact. Site without signs and symptoms of complications. Dressing and pressure applied.  An After Visit Summary was printed and given to the patient. Patient received prescription.  D/c education completed with patient/family including follow up instructions, medication list, d/c activities limitations if indicated, with other d/c instructions as indicated by MD - patient able to verbalize understanding, all questions fully answered.   Patient instructed to return to ED, call 911, or call MD for any changes in condition.   Patient escorted via WC, and D/C home via private auto.  Eligah East 09/14/2016 6:43 PM

## 2016-09-14 NOTE — Care Management Note (Addendum)
Case Management Note  Patient Details  Name: Jamie Mason MRN: [id number] Date of Birth: 1940-12-04  Subjective/Objective:           Admitted with worsening SOB found to be in CHF exacerbation with fluid overload.  PMH significant for DM, CAD, HTN, and aflutter s/p cardioversion in 07/2016. From home with family. Independent with ADL's PTA. DME: cane , walker.  Carlyann Placide (Spouse)    [phone]      Action/Plan: Plan is to d/c to home today with home health services (PT). Pt active with [facility].   Expected Discharge Date:  09/14/16               Expected Discharge Plan:  Home w Home Health Services (resides with husband, and daughter, Loistine Simas)  In-House Referral:     Discharge planning Services  CM Consult  Post Acute Care Choice:    Choice offered to:  Patient  DME Arranged:    DME Agency:     HH Arranged:  PT HH Agency:  [facility] (referral made with Lupita Leash ) [facility] declined services CM aware 09/15/2016 @ 10:20 am. CM made referral to [facility] [place] @ [phone] for home heath PT and he stated the earliest agency to see pt is Monday April 9th. CM made discharging MD aware and MD was ok with plan. CM then made [facility] aware of MD's ok. CM made pt aware. 09/15/2016 @ 1315 CM faxed order to [facility] @ [phone] for home health PT.  Status of Service:  Completed, signed off  If discussed at Long Length of Stay Meetings, dates discussed:    Additional Comments:  Epifanio Lesches, RN 09/14/2016, 5:23 PM

## 2016-09-14 NOTE — Progress Notes (Addendum)
Subjective: Breathing better, no chest pain.  Complaining of back pain from laying in the bed, has only been up to the bedside commode frequently to urinate.  Had an episode of rash and pruritis consistent with urticaria last night, without and facial/oral swelling or breathing/swallowing dysfunction.  Given diphenhydramine and symptoms resolved.  She did not receive any new meds yesterday other than oral potassium chloride.  Objective:  Vital signs in last 24 hours: Vitals:   09/13/16 2206 09/14/16 0418 09/14/16 0536 09/14/16 0555  BP: (!) 132/44  (!) 124/35 132/67  Pulse: (!) 54  (!) 56 (!) 58  Resp: 16  16   Temp: 98.4 F (36.9 C)  98.3 F (36.8 C)   SpO2: 100%  100% 100%  Weight:  140 lb 4.8 oz (63.6 kg)    Height:       Telemetry: sinus bradycardia 50s-60s, one 4 beat run of NSVT yesterday midday  Intake/Output Summary (Last 24 hours) at 09/14/16 1026 Last data filed at 09/14/16 0900  Gross per 24 hour  Intake              360 ml  Output              850 ml  Net             -490 ml   Filed Weights   09/12/16 1852 09/13/16 0429 09/14/16 0418  Weight: (!) 311 lb 6.4 oz (141.3 kg) (!) 307 lb 11.2 oz (139.6 kg) 140 lb 4.8 oz (63.6 kg)    Physical Exam  Constitutional: She is oriented to person, place, and time.  Morbidly obese woman lying flat in bed with nasal canula in place in no distress  Neck:  Unable to assess JVD due to obesity  Cardiovascular:  Distant heart sounds Regular rate and rhythm 2/6 systolic murmur  Pulmonary/Chest: Effort normal. No respiratory distress. She has no wheezes. She has no rales.  Breath sounds reduced throughout  Abdominal: Soft. She exhibits no distension. There is no tenderness.  Neurological: She is alert and oriented to person, place, and time.  Psychiatric: She has a normal mood and affect. Her behavior is normal.   CBC Latest Ref Rng & Units 09/13/2016 09/12/2016 08/19/2016  WBC 4.0 - 10.5 K/uL 11.6(H) 10.4 10.4  Hemoglobin 12.0 -  15.0 g/dL 1.6(X) 10.0(L) -  Hematocrit 36.0 - 46.0 % 28.4(L) 32.4(L) 33.2(L)  Platelets 150 - 400 K/uL 347 386 431(H)   CMP Latest Ref Rng & Units 09/14/2016 09/13/2016 09/12/2016  Glucose 65 - 99 mg/dL 096(E) 454(U) 981(X)  BUN 6 - 20 mg/dL 91(Y) 78(G) 20  Creatinine 0.44 - 1.00 mg/dL 9.56(O) 1.30(Q) 6.57(Q)  Sodium 135 - 145 mmol/L 139 141 137  Potassium 3.5 - 5.1 mmol/L 3.8 3.8 4.2  Chloride 101 - 111 mmol/L 101 103 103  CO2 22 - 32 mmol/L Calcium 8.9 - 10.3 mg/dL 8.9 9.2 4.6(N)  Total Protein 6.5 - 8.1 g/dL - - 7.6  Total Bilirubin 0.3 - 1.2 mg/dL - - 0.6  Alkaline Phos 38 - 126 U/L - - 87  AST 15 - 41 U/L - - 21  ALT 14 - 54 U/L - - 13(L)   EKG 09/13/2016 x3 Anterolateral TWI new since admission, stable throughout the day  Assessment/Plan:  Principal Problem:   Acute exacerbation of CHF (congestive heart failure) (HCC) Active Problems:   Hypertension   Hyperlipidemia   CAD (coronary artery disease)   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 female with HFpEF, CAD, DM who presented with acutely worsening dyspnea and chest pressure suggestive of CHF exacerbation.  She has ruled out for MI with low troponins and flat trend, but has an abnormal EKG.  Her volume status is challenging to assess clinically due to her obesity, but her BNP was elevated, CXR may show some edema, and recent cath showed increased filling pressures, and she is improving with diuresis.  #HFpEF #CHF Exacerbation -Continue IV lasix 60 mg BID -continue home carvedilol 25 mg BID, amlodipine 2.5 mg daily -strict I/Os -daily weights -telemetry -PT eval, ambulatory pulse oximetry -Follow BMP, replete K and Mg as needed  #CAD New TWI concerning for ischemic changes, but no chest pain and ruled out for MI on admission with serial negative troponins.  Recent coronary  angiography in 06/2016 showed chronic total RCA occlusion, and moderate LMCA stenosis.  With her known CAD, recent cath, and ACS ruled out, does not warrant further ischemic evaluation at this time. -continue home atorvastatin, carvedilol, aspirin  #History of AFlutter -Continue home amiodarone, carvedilol, apixaban -Telemetry -Follow BMP, replete K and Mg as needed  #DM SSI  Fluids: none Diet: heart/carb DVT Prophylaxis: apixaban Code Status: full  Dispo: Anticipated discharge in approximately 0-1 day(s).   Alm Bustard, MD 09/14/2016, 7:43 AM Pager: [phone]

## 2016-09-14 NOTE — Progress Notes (Signed)
SATURATION QUALIFICATIONS: (This note is used to comply with regulatory documentation for home oxygen)  Patient Saturations on Room Air at Rest =100%  Patient Saturations on Room Air while Ambulating = 95%  Please briefly explain why patient needs home oxygen: 

## 2016-09-14 NOTE — Discharge Summary (Signed)
Name: Jamie Mason MRN: [id number] DOB: 06-21-1940 76 y.o. PCP: Jamie Spain, MD  Date of Admission: 09/12/2016  5:16 AM Date of Discharge: 09/14/2016 Attending Physician: Jamie Alias, MD  Discharge Diagnosis:  Principal Problem:   Acute exacerbation of CHF (congestive heart failure) (HCC) Active Problems:   Hypertension   Hyperlipidemia   CAD (coronary artery disease)   Type 2 diabetes mellitus with stage 2 chronic kidney disease (HCC)   Severe obesity (BMI >= 40) (HCC)   Chronic venous insufficiency   History of atrial flutter   Discharge Medications: Allergies as of 09/14/2016      Reactions   Lisinopril Swelling   Tongue swelling    Ace Inhibitors Other (See Comments)   unknown   Penicillins Itching, Rash   Has patient had a PCN reaction causing immediate rash, facial/tongue/throat swelling, SOB or lightheadedness with hypotension: unknown Has patient had a PCN reaction causing severe rash involving mucus membranes or skin necrosis: unknown Has patient had a PCN reaction that required hospitalization: no Has patient had a PCN reaction occurring within the last 10 years: no If all of the above answers are "NO", then may proceed with Cephalosporin use.      Medication List    TAKE these medications   albuterol 108 (90 Base) MCG/ACT inhaler Commonly known as:  PROVENTIL HFA;VENTOLIN HFA Inhale 2 puffs into the lungs every 6 (six) hours as needed for wheezing or shortness of breath.   amiodarone 200 MG tablet Commonly known as:  PACERONE Take 2 tablets (400 mg total) by mouth 2 (two) times daily. 2 tabs twice daily x5 days. Then 1 tab twice daily x2 weeks. Then one tab daily.   amLODipine 2.5 MG tablet Commonly known as:  NORVASC Take 1 tablet (2.5 mg total) by mouth daily.   apixaban 5 MG Tabs tablet Commonly known as:  ELIQUIS Take 1 tablet (5 mg total) by mouth 2 (two) times daily.   ASPIR-LOW 81 MG EC tablet Generic drug:  aspirin TAKE 1  TABLET EVERY DAY   atorvastatin 40 MG tablet Commonly known as:  LIPITOR Take 1 tablet (40 mg total) by mouth daily.   carvedilol 25 MG tablet Commonly known as:  COREG Take 1 tablet (25 mg total) by mouth 2 (two) times daily with a meal.   docusate sodium 100 MG capsule Commonly known as:  COLACE Take 1 capsule (100 mg total) by mouth daily as needed. What changed:  reasons to take this   furosemide 40 MG tablet Commonly known as:  LASIX Take 2 tablets (80 mg total) by mouth daily. What changed:  how much to take   gabapentin 100 MG capsule Commonly known as:  NEURONTIN Take 1 capsule (100 mg total) by mouth 3 (three) times daily.   hydrocerin Crea Apply to bilateral LEs after cleansing on Mondays and Thursdays (prior to [employer] application).   metFORMIN 500 MG tablet Commonly known as:  GLUCOPHAGE TAKE 1 TABLET (500 MG TOTAL) BY MOUTH 2 (TWO) TIMES DAILY WITH A MEAL.       Disposition and follow-up:   Jamie Mason was discharged from [facility] in Stable condition.  At the hospital follow up visit please address:  1.  CHF and Volume Status.  Discharged on double prior-to-admission lasix.  Please assess volume status and manage diuretic.  THN to assist with home weights.  2.  ?OSA.  Her recent left heart cath showed elevated PA pressures.  With her obesity, OSA seems likely.  Consider order sleep study  3.  CAD.  Ask about further chest pain/pressure.  She had a recent cath but developed new TWI this admission.  Appointment made with cardiology in about 2 months.  4.  Labs / imaging needed at time of follow-up: nocturnal polysomnography, BMP, Mg, CBC  5.  Pending labs/ test needing follow-up: none  Follow-up Appointments: Follow-up Information    Jamie Haws, MD. Go in 58 day(s).   Specialty:  Cardiology Why:  at 10am Contact information: [address] [place] [address] [phone]        Jamie Media,  MD. Go in 1 day(s).   Specialty:  Internal Medicine Why:  at 10:15 am Contact information: [address] [place] [address] [phone]           Hospital Course by problem list: Principal Problem:   Acute exacerbation of CHF (congestive heart failure) (HCC) Active Problems:   Hypertension   Hyperlipidemia   CAD (coronary artery disease)   Type 2 diabetes mellitus with stage 2 chronic kidney disease (HCC)   Severe obesity (BMI >= 40) (HCC)   Chronic venous insufficiency   History of atrial flutter   1. Pulmonary Edema due to CHF Exacerbation Jamie Mason is a 76 year old woman with HFpEF and CAD who presented with acutely worsening dyspnea.  She has had no cough, fevers, or leg edema, but has had some intermittent dyspnea for a couple of weeks.  On the morning of admission she woke up suddenly at night with chest/epigastric pressure and dyspnea.  She used her albuterol inhaler without relief and called EMS, who reported that her O2 saturation was in the 70s.  She was placed on BiPAP during transport and in the ED and treated with nebulizers and steroids.  However, at admission she was found to have an elevated BNP, mild diffuse interstitial infiltrates consistent with mild pulmonary edema, and fine crackles, though her volume status was clinically difficult to assess due to her obesity.  She was diuresed with IV lasix for 2 days until her creatinine and BUN mildly increased, but did not clearly lose any weight.  Nonetheless, her dyspnea improved and she longer had an oxygen requirement.  She was discharged home on increased dose of oral lasix and instructed to keep a preexisting appointment with her PCP the next day.  2. CAD She had a recent NSTEMI in January with cardiac catheterization showing diffuse disease, and with her chest tightness ACS was a concern.  MI was ruled out with stable EKG at admission and barely detectable troponins with flat trend and she became  asymptomatic.  However, the day after admission she developed new anterolateral T-wave inversions which were stable across follow-up EKGs.  She did not have further chest or epigastric discomfort and her dyspnea improved.  She was continued on her prior-to-admission aspirin, carvedilol, and atorvastatin, and follow-up was arranged with her PCP and cardiologist.  3. DM2 Metformin help due to lactic acidosis at presentation, and maintained on sliding scale corrective insulin.  Metformin resumed on discharge.  5. History of Atrial Flutter Continued on home amiodarone, carvedilol, and apixaban.  Normal sinus or sinus bradycardia on telemetry with one run of 4 beats of NSVT.  No AF.  Discharge Vitals:   BP 124/60   Pulse (!) 56   Temp 98.3 F (36.8 C)   Resp 18   Ht  (1.651 m)   Wt Marland Kitchen)  307 lb (139.3 kg)   SpO2 96%   BMI 51.09 kg/m    Pertinent Labs, Studies, and Procedures:   BNP    Component Value Date/Time   BNP 592.8 (H) 09/12/2016 0526   CMP Latest Ref Rng & Units 09/14/2016 09/13/2016 09/12/2016  Glucose 65 - 99 mg/dL 829(F) 621(H) 086(V)  BUN 6 - 20 mg/dL 78(I) 69(G) 20  Creatinine 0.44 - 1.00 mg/dL 2.95(M) 8.41(L) 2.44(W)  Sodium 135 - 145 mmol/L 139 141 137  Potassium 3.5 - 5.1 mmol/L 3.8 3.8 4.2  Chloride 101 - 111 mmol/L 101 103 103  CO2 22 - 32 mmol/L Calcium 8.9 - 10.3 mg/dL 8.9 9.2 1.0(U)  Total Protein 6.5 - 8.1 g/dL - - 7.6  Total Bilirubin 0.3 - 1.2 mg/dL - - 0.6  Alkaline Phos 38 - 126 U/L - - 87  AST 15 - 41 U/L - - 21  ALT 14 - 54 U/L - - 13(L)   Cardiac Panel (last 3 results)  Recent Labs  09/12/16 1143 09/12/16 1739  TROPONINI 0.08* 0.08*   Chest Radiograph AP 09/12/2016 IMPRESSION: Cardiomegaly with interstitial and alveolar opacities, likely congestive heart failure.  Discharge Instructions: Discharge Instructions    (HEART FAILURE PATIENTS) Call MD:  Anytime you have any of the following symptoms: 1) 3 pound weight gain in 24 hours  or 5 pounds in 1 week 2) shortness of breath, with or without a dry hacking cough 3) swelling in the hands, feet or stomach 4) if you have to sleep on extra pillows at night in order to breathe.    Complete by:  As directed    Call MD for:  difficulty breathing, headache or visual disturbances    Complete by:  As directed    Call MD for:  persistant dizziness or light-headedness    Complete by:  As directed    Diet - low sodium heart healthy    Complete by:  As directed    Increase activity slowly    Complete by:  As directed      You were admitted to the hospital for shortness of breath, which was most likely caused by fluid building up in your lungs because of your heart not pumping well.  We gave you medicines to get fluid off and your breathing improved.  Please double your dose of Lasix at home for now; start taking 80 mg per day instead of 40 mg per day.  When you follow-up with Dr Rogelia Boga or someone else in clinic, they may adjust your Lasix further.  If you have worse swelling or trouble breathing, please call the clinic.  If feel like you can't breath or have chest pain, please go back to the ED.  Signed: Alm Bustard, MD 09/14/2016, 4:40 PM   Pager: [phone]

## 2016-09-14 NOTE — Progress Notes (Signed)
Internal Medicine Attending:   I saw and examined the patient. I reviewed the resident's note and I agree with the resident's findings and plan as documented in the resident's note.  76 year old woman hospital day #3 with acute on chronic heart failure with preserved EF. She is doing well with diuresis and supportive care. Please wean off the supplemental oxygen to maintain O2 saturations above 92%. Also please ambulate the patient and check pulse oximetry. Weight is stable at 307 pounds today. In and out's were not recorded. Small increase in serum creatinine from 1.2 to 1.4 today, with increase in BUN as well. PT consult placed. If the patient is able to ambulate without oxygen requirements and does reasonably well with physical therapy, I think we can probably discharge to home later today. Would continue outpatient diuresis with Lasix 40 mg twice a day, needs follow-up with Dr. Eden Emms from cardiology, establish with [facility] for home weight checks, and I would recommend an outpatient sleep study to rule out OSA as a cause for elevated pulmonary pressures.

## 2016-09-14 NOTE — Consult Note (Signed)
   [facility] CM Inpatient Consult   09/14/2016  Jamie Mason Dec 04, 1940 [id number]   Update: Call received from call from Dr. Oswaldo Done regarding home weights and monitoring needed for community nurse to follow up.  Will update RN [employer] of the request.  Spoke with [employer] manager and she states the patient already has a scale from [facility] and she plans a home visit on Monday for post hospital follow up.  For further questions please contact:  Charlesetta Shanks, RN BSN CCM [facility]  [phone] business mobile phone Toll free office [phone]

## 2016-09-14 NOTE — Evaluation (Signed)
Physical Therapy Evaluation Patient Details Name: Jamie Mason MRN: [id number] DOB: [birth year] Today's Date: 09/14/2016   History of Present Illness  Pt is a 76 y/o female admitted with worsening SOB found to be in CHF exacerbation with fluid overload.  PMH significant for DM, CAD, HTN, and aflutter s/p cardioversion in 07/2016.   Clinical Impression  Pt tolerated session well.  Demonstrates all mobility with overall supervision level, however demos significantly decreased endurance and SOB after ambulating 8' with RW.  Discussed f/u with HHPT for progression of activity tolerance once d/c and pt in agreement.      Follow Up Recommendations Home health PT;Supervision - Intermittent    Equipment Recommendations  None recommended by PT    Recommendations for Other Services       Precautions / Restrictions Precautions Precautions: Fall Precaution Comments: severely decrease activity tolerance Restrictions Weight Bearing Restrictions: No      Mobility  Bed Mobility               General bed mobility comments: pt sitting EOB on arrival  Transfers Overall transfer level: Needs assistance Equipment used: Rolling walker (2 wheeled) Transfers: Sit to/from Stand Sit to Stand: Min guard         General transfer comment: cues for hand placement on bed  Ambulation/Gait Ambulation/Gait assistance: Supervision Ambulation Distance (Feet): 8 Feet Assistive device: Rolling walker (2 wheeled) Gait Pattern/deviations: Trunk flexed;Wide base of support   Gait velocity interpretation: <1.8 ft/sec, indicative of risk for recurrent falls General Gait Details: pt ambulates with forward flexed posture and needs cues for walker positioning  Stairs            Wheelchair Mobility    Modified Rankin (Stroke Patients Only)       Balance Overall balance assessment: Needs assistance Sitting-balance support: No upper extremity supported;Feet supported Sitting balance-Leahy  Scale: Good     Standing balance support: Bilateral upper extremity supported Standing balance-Leahy Scale: Fair                               Pertinent Vitals/Pain Pain Assessment: No/denies pain    Home Living Family/patient expects to be discharged to:: Private residence Living Arrangements: Children;Spouse/significant other Available Help at Discharge: Family;Available 24 hours/day Type of Home: House Home Access: Stairs to enter Entrance Stairs-Rails: Right Entrance Stairs-Number of Steps: 3 Home Layout: One level Home Equipment: Walker - 2 wheels;Cane - quad Additional Comments: husband is blind, 1 dtr works, other dtr stays at home during the day    Prior Function Level of Independence: Independent with assistive device(s)         Comments: walks with cane, sleeps in recliner, sponge bathes     Hand Dominance   Dominant Hand: Right    Extremity/Trunk Assessment        Lower Extremity Assessment Lower Extremity Assessment: Generalized weakness       Communication   Communication: No difficulties  Cognition Arousal/Alertness: Awake/alert Behavior During Therapy: WFL for tasks assessed/performed Overall Cognitive Status: Within Functional Limits for tasks assessed                                        General Comments      Exercises     Assessment/Plan    PT Assessment Patient needs continued PT services  PT Problem List  Decreased activity tolerance;Decreased mobility;Decreased knowledge of use of DME;Cardiopulmonary status limiting activity;Obesity       PT Treatment Interventions DME instruction;Gait training;Stair training;Functional mobility training;Therapeutic activities;Therapeutic exercise;Balance training;Patient/family education    PT Goals (Current goals can be found in the Care Plan section)  Acute Rehab PT Goals Patient Stated Goal: to get to where she can clean her house PT Goal Formulation: With  patient Time For Goal Achievement: 09/21/16 Potential to Achieve Goals: Fair    Frequency Min 2X/week   Barriers to discharge Decreased caregiver support;Inaccessible home environment      Co-evaluation               End of Session   Activity Tolerance: Patient limited by fatigue Patient left: with call bell/phone within reach;with family/visitor present;Other (comment) (sitting EOB) Nurse Communication: Mobility status PT Visit Diagnosis: Difficulty in walking, not elsewhere classified (R26.2);History of falling (Z91.81)    Time: 1340-1403 PT Time Calculation (min) (ACUTE ONLY): 23 min   Charges:   PT Evaluation $PT Eval Low Complexity: 1 Procedure PT Treatments $Therapeutic Activity: 8-22 mins   PT G Codes:        Teodoro Kil, PT, DPT  09/14/16 3:40 PM

## 2016-09-14 NOTE — Progress Notes (Signed)
SATURATION QUALIFICATIONS: (This note is used to comply with regulatory documentation for home oxygen)  Patient Saturations on Room Air at Rest = 100%  Patient Saturations on Room Air while Ambulating = 100%  Pt dropped to 87% at end of walk, jumped right back up to 100% with out oxygen   Please briefly explain why patient needs home oxygen:

## 2016-09-14 NOTE — Telephone Encounter (Signed)
Calling to confirm appt for 09/15/16 at 10:15

## 2016-09-14 NOTE — Discharge Instructions (Signed)
You were admitted to the hospital for shortness of breath, which was most likely caused by fluid building up in your lungs because of your heart not pumping well.  We gave you medicines to get fluid off and your breathing improved.  Please double your dose of Lasix at home for now; start taking 80 mg per day instead of 40 mg per day.  When you follow-up with Dr Rogelia Boga or someone else in clinic, they may adjust your Lasix further.  If you have worse swelling or trouble breathing, please call the clinic.  If feel like you can't breath or have chest pain, please go back to the ED.

## 2016-09-15 ENCOUNTER — Other Ambulatory Visit: Payer: Self-pay | Admitting: *Deleted

## 2016-09-15 ENCOUNTER — Ambulatory Visit: Payer: Medicare HMO | Admitting: Internal Medicine

## 2016-09-15 NOTE — Patient Outreach (Signed)
[facility]) Care Management  09/15/2016  Jamie Mason 12-18-1940 [id number]   CSW was able to make contact with patient today to follow-up regarding recent hospitalization, as well as to ensure that patient received the second packet of resource information mailed to her home by CSW.  Patient admitted that she did receive the packet of information so CSW took the opportunity to review the enclosed material with patient, including process for arranging transportation services through [employer].  Patient is working with Dierdre Highman, Representative with [employer], regarding financial assistance for home repairs.  Patient denies having any additional social work needs at present, but was encouraged to contact CSW directly if social work needs arise in the near future. CSW will perform a case closure on patient, as all goals of treatment have been met from social work standpoint and no additional social work needs have been identified at this time.  CSW will notify patient's RNCM with [facility], Valente David of CSW's plans to close patient's case.  CSW will fax an update to patient's Primary Care Physician, Dr. Larey Dresser to ensure that they are aware of CSW's involvement with patient's plan of care.  CSW will submit a case closure request to Verlon Setting, Care Management Assistant with [facility], in the form of an [employer].  CSW will ensure that Mrs. Comer is aware of Mrs. Lane's, RNCM with [facility], continued involvement with patient's care. Jamie Mason, BSW, MSW, LCSW  Licensed Education officer, [facility]  Mailing [place] [address], [place], [place] [address] Physical Address-[address], [place], [place] [address] Toll Free Main # [phone] Fax # [phone] Cell # [phone]   Office # [phone] [email or web address]

## 2016-09-16 ENCOUNTER — Other Ambulatory Visit: Payer: Self-pay | Admitting: *Deleted

## 2016-09-16 NOTE — Patient Outreach (Signed)
[facility]) Care Management  09/16/2016  Jamie Mason 1940/08/20 [id number]   Member discharged from hospital on Wednesday.  Call placed to member to restart transition of care program.  She state that she's not sure of her most recent diagnosis, stating she was "short of breath" but was never given a definitive diagnosis.  Informed that per discharge summary, recent diagnosis was congestive heart failure.  She does confirm that her Lasix dose was increased, report compliance with medications, but also state there are some medications that she is out of.  She has requested refills, but have not received them yet.  She is unable to recall the names.    She had follow up appointment scheduled for yesterday but had to cancel due to not having transportation.  Her transportation services require 48 hour notice.  Appointment rescheduled for 4/26.  She confirms that she still has scale provided by [facility], advised to begin daily weights and record.  She is not familiar with heart failure action plan/zones.  Educated briefly on zones, will elaborate during home visit.  Home visit scheduled for next week, will review medications while in the home.    [name], [place], MSN [facility] Care Management  [facility] Manager [phone]

## 2016-09-19 ENCOUNTER — Telehealth: Payer: Self-pay

## 2016-09-19 ENCOUNTER — Other Ambulatory Visit: Payer: Self-pay | Admitting: *Deleted

## 2016-09-19 NOTE — Telephone Encounter (Signed)
[facility] from [facility] needs to speak with a nurse about pt. Please call pt back.

## 2016-09-19 NOTE — Patient Outreach (Addendum)
[facility]) Care Management   09/19/2016  Jamie Mason [birth year] [id number]  Jamie Mason is an 76 y.o. female  Subjective:   Member report that she has been doing "alright" since her discharge.  She denies shortness of breath or chest discomfort, legs remain slightly swollen but much improved.  She report compliance with medications although she has not had Eliquis and Atorvastatin in about a week.  She state she has requested refill, awaiting arrival.    Objective:   Review of Systems  Constitutional: Negative.   HENT: Negative.   Eyes: Negative.   Respiratory: Negative.   Cardiovascular: Positive for leg swelling.       Improved  Gastrointestinal: Negative.   Genitourinary: Negative.   Musculoskeletal: Negative.   Skin: Negative.   Neurological: Negative.   Endo/Heme/Allergies: Negative.   Psychiatric/Behavioral: Negative.     Physical Exam  Constitutional: She is oriented to person, place, and time. She appears well-developed and well-nourished.  Neck: Normal range of motion.  Cardiovascular: Normal rate, regular rhythm and normal heart sounds.   Respiratory: Effort normal and breath sounds normal.  GI: Soft. Bowel sounds are normal.  Musculoskeletal: Normal range of motion.  Neurological: She is alert and oriented to person, place, and time.  Skin: Skin is warm and dry.   BP (!) 163/73 (BP Location: Left Arm, Patient Position: Sitting, Cuff Size: Large)   Pulse 70   Resp 20   Wt 299 lb 6.4 oz (135.8 kg)   SpO2 98%   BMI 49.82 kg/m   Encounter Medications:   Outpatient Encounter Prescriptions as of 09/19/2016  Medication Sig Note  . albuterol (PROVENTIL HFA;VENTOLIN HFA) 108 (90 Base) MCG/ACT inhaler Inhale 2 puffs into the lungs every 6 (six) hours as needed for wheezing or shortness of breath.   Marland Kitchen amiodarone (PACERONE) 200 MG tablet Take 2 tablets (400 mg total) by mouth 2 (two) times daily. 2 tabs twice daily x5 days. Then 1 tab twice  daily x2 weeks. Then one tab daily.   Marland Kitchen amLODipine (NORVASC) 2.5 MG tablet Take 1 tablet (2.5 mg total) by mouth daily.   . ASPIR-LOW 81 MG EC tablet TAKE 1 TABLET EVERY DAY   . carvedilol (COREG) 25 MG tablet Take 1 tablet (25 mg total) by mouth 2 (two) times daily with a meal.   . furosemide (LASIX) 40 MG tablet Take 2 tablets (80 mg total) by mouth daily.   Marland Kitchen gabapentin (NEURONTIN) 100 MG capsule Take 1 capsule (100 mg total) by mouth 3 (three) times daily.   . hydrocerin (EUCERIN) CREA Apply to bilateral LEs after cleansing on Mondays and Thursdays (prior to [employer] application).   . metFORMIN (GLUCOPHAGE) 500 MG tablet TAKE 1 TABLET (500 MG TOTAL) BY MOUTH 2 (TWO) TIMES DAILY WITH A MEAL.   Marland Kitchen apixaban (ELIQUIS) 5 MG TABS tablet Take 1 tablet (5 mg total) by mouth 2 (two) times daily. (Patient not taking: Reported on 09/19/2016) 09/19/2016: Refill ordered  . atorvastatin (LIPITOR) 40 MG tablet Take 1 tablet (40 mg total) by mouth daily. (Patient not taking: Reported on 09/19/2016) 09/19/2016: Refill ordered  . docusate sodium (COLACE) 100 MG capsule Take 1 capsule (100 mg total) by mouth daily as needed. (Patient not taking: Reported on 09/19/2016)    No facility-administered encounter medications on file as of 09/19/2016.     Functional Status:   In your present state of health, do you have any difficulty performing the following activities: 09/13/2016 08/19/2016  Hearing? N N  Vision? N N  Difficulty concentrating or making decisions? N N  Walking or climbing stairs? Y Y  Dressing or bathing? Y N  Doing errands, shopping? Y Y  Preparing Food and eating ? - -  Using the Toilet? - -  In the past six months, have you accidently leaked urine? - -  Do you have problems with loss of bowel control? - -  Managing your Medications? - -  Managing your Finances? - -  Housekeeping or managing your Housekeeping? - -  Some recent data might be hidden    Fall/Depression Screening:    PHQ 2/9 Scores  08/16/2016 11/17/2015 07/23/2015 04/16/2015 12/11/2014 11/26/2014 10/22/2014  PHQ - 2 Score 1 1 0 0 0 0 0    Assessment:    Met with member at scheduled time.  Heart failure management (including zones and action plan) reviewed.  Notified that copy of zones are located inside [facility] calendar.  Confirmed that her scale was functional, educated on importance of daily weights and best time of day.  She is advised to document daily as to review trends, educated on when to contact MD according to action plan.  She verbalizes understanding.    Patient was recently discharged from hospital and all medications have been reviewed.  Call placed to [facility] mail order pharmacy twice in attempt to check on order status for Atorvastatin and Eliquis, unfortunately they were experiencing high volumes, unable to speak to representative.  Member state she will call later today.  Low sodium diet discussed, member report that she will try to adhere, however report that she does not like some foods that are recommended.  "I love me some fried chicken."  Discussed using fresh/frozen fruits and vegetables rather than processed/canned.  Advised to rinse canned vegetables prior to cooking to decrease salt content.  She state that she will try fruit/vegetable smoothies in effort to help with weight management.  She confirms that home health was ordered, state she did receive call from agency and they will begin services either today or tomorrow.  Advised to notify this care manager if no contact/visit within the next 2 days.  Follow up appointment with primary MD confirmed, advised to secure transportation.  She state that she has continued to have some soreness/burning sensation in the left side of her head down to her face.  She state that the Gabapentin has not been as effective as she would have hoped.  She is advised to discuss with MD during visit.  She report contact with LCSW, state she will complete application for home repairs and  submit.  She denies any further concerns at this time, advised to contact with questions.  Plan:   Will follow up with weekly transition of care call next week.  THN CM Care Plan Problem One     Most Recent Value  Care Plan Problem One  Risk for hospital admission related to heart failure as evidenced by recent hospitalization  Role Documenting the Problem One  Care Management [place] for Problem One  Active  THN Long Term Goal (31-90 days)  Member will not be readmitted to hospital within the next 31 days  THN Long Term Goal Start Date  09/16/16  Interventions for Problem One Long Term Goal  Discussed with member the importance of following discharge instructions, including follow up appointments, medications, diet, and compliance with home health involvement, to decrease the risk of readmission  THN CM Short Term  Goal #1 (0-30 days)  Member will take medications as prescribed for the next 4 weeks  THN CM Short Term Goal #1 Start Date  09/16/16  Interventions for Short Term Goal #1  Educated on the importance of taking medications as prescribed.  Medication list reviewed, discussed importance of taking Lasix as dose was increased.  THN CM Short Term Goal #2 (0-30 days)  Member will have follow up appointment within the next 2 weeks  THN CM Short Term Goal #2 Start Date  09/16/16  Interventions for Short Term Goal #2  Confirmed member has appointment scheduled, advised to call to secure transportation.  THN CM Short Term Goal #3 (0-30 days)  Member will weigh self and record readings daily over the next 4 weeks  THN CM Short Term Goal #3 Start Date  09/16/16  Interventions for Short Tern Goal #3  Confirmed that member has working scale in the home, educated on the importance of daily weights and monitioring fluid status.     Valente David, [place], MSN [place] [phone]

## 2016-09-19 NOTE — Telephone Encounter (Signed)
rtc to [facility], no answer, lm for rtc

## 2016-09-20 DIAGNOSIS — E1122 Type 2 diabetes mellitus with diabetic chronic kidney disease: Secondary | ICD-10-CM | POA: Diagnosis not present

## 2016-09-20 DIAGNOSIS — M6281 Muscle weakness (generalized): Secondary | ICD-10-CM | POA: Diagnosis not present

## 2016-09-20 DIAGNOSIS — I2510 Atherosclerotic heart disease of native coronary artery without angina pectoris: Secondary | ICD-10-CM | POA: Diagnosis not present

## 2016-09-20 DIAGNOSIS — I130 Hypertensive heart and chronic kidney disease with heart failure and stage 1 through stage 4 chronic kidney disease, or unspecified chronic kidney disease: Secondary | ICD-10-CM | POA: Diagnosis not present

## 2016-09-20 DIAGNOSIS — N182 Chronic kidney disease, stage 2 (mild): Secondary | ICD-10-CM | POA: Diagnosis not present

## 2016-09-20 DIAGNOSIS — I739 Peripheral vascular disease, unspecified: Secondary | ICD-10-CM | POA: Diagnosis not present

## 2016-09-20 DIAGNOSIS — E785 Hyperlipidemia, unspecified: Secondary | ICD-10-CM | POA: Diagnosis not present

## 2016-09-20 DIAGNOSIS — I509 Heart failure, unspecified: Secondary | ICD-10-CM | POA: Diagnosis not present

## 2016-09-20 DIAGNOSIS — I872 Venous insufficiency (chronic) (peripheral): Secondary | ICD-10-CM | POA: Diagnosis not present

## 2016-09-21 NOTE — Telephone Encounter (Signed)
VO OK with me 

## 2016-09-22 ENCOUNTER — Encounter: Payer: Self-pay | Admitting: *Deleted

## 2016-09-22 NOTE — Progress Notes (Signed)
This encounter was created in error - please disregard.

## 2016-09-23 DIAGNOSIS — I872 Venous insufficiency (chronic) (peripheral): Secondary | ICD-10-CM | POA: Diagnosis not present

## 2016-09-23 DIAGNOSIS — I509 Heart failure, unspecified: Secondary | ICD-10-CM | POA: Diagnosis not present

## 2016-09-23 DIAGNOSIS — N182 Chronic kidney disease, stage 2 (mild): Secondary | ICD-10-CM | POA: Diagnosis not present

## 2016-09-23 DIAGNOSIS — E1122 Type 2 diabetes mellitus with diabetic chronic kidney disease: Secondary | ICD-10-CM | POA: Diagnosis not present

## 2016-09-23 DIAGNOSIS — I2510 Atherosclerotic heart disease of native coronary artery without angina pectoris: Secondary | ICD-10-CM | POA: Diagnosis not present

## 2016-09-23 DIAGNOSIS — M6281 Muscle weakness (generalized): Secondary | ICD-10-CM | POA: Diagnosis not present

## 2016-09-23 DIAGNOSIS — E785 Hyperlipidemia, unspecified: Secondary | ICD-10-CM | POA: Diagnosis not present

## 2016-09-23 DIAGNOSIS — I739 Peripheral vascular disease, unspecified: Secondary | ICD-10-CM | POA: Diagnosis not present

## 2016-09-23 DIAGNOSIS — I130 Hypertensive heart and chronic kidney disease with heart failure and stage 1 through stage 4 chronic kidney disease, or unspecified chronic kidney disease: Secondary | ICD-10-CM | POA: Diagnosis not present

## 2016-09-26 ENCOUNTER — Other Ambulatory Visit: Payer: Self-pay | Admitting: *Deleted

## 2016-09-26 DIAGNOSIS — I130 Hypertensive heart and chronic kidney disease with heart failure and stage 1 through stage 4 chronic kidney disease, or unspecified chronic kidney disease: Secondary | ICD-10-CM | POA: Diagnosis not present

## 2016-09-26 DIAGNOSIS — I872 Venous insufficiency (chronic) (peripheral): Secondary | ICD-10-CM | POA: Diagnosis not present

## 2016-09-26 DIAGNOSIS — M6281 Muscle weakness (generalized): Secondary | ICD-10-CM | POA: Diagnosis not present

## 2016-09-26 DIAGNOSIS — E785 Hyperlipidemia, unspecified: Secondary | ICD-10-CM | POA: Diagnosis not present

## 2016-09-26 DIAGNOSIS — N182 Chronic kidney disease, stage 2 (mild): Secondary | ICD-10-CM | POA: Diagnosis not present

## 2016-09-26 DIAGNOSIS — I509 Heart failure, unspecified: Secondary | ICD-10-CM | POA: Diagnosis not present

## 2016-09-26 DIAGNOSIS — I2510 Atherosclerotic heart disease of native coronary artery without angina pectoris: Secondary | ICD-10-CM | POA: Diagnosis not present

## 2016-09-26 DIAGNOSIS — I739 Peripheral vascular disease, unspecified: Secondary | ICD-10-CM | POA: Diagnosis not present

## 2016-09-26 DIAGNOSIS — E1122 Type 2 diabetes mellitus with diabetic chronic kidney disease: Secondary | ICD-10-CM | POA: Diagnosis not present

## 2016-09-26 NOTE — Patient Outreach (Signed)
[facility]) Care Management  09/26/2016  Jamie Mason 02-22-1941 [id number]   Weekly transition of care call placed to member.  She report that she is doing well, report taking medications but still does not have her Eliquis.  She state that she has requested it from the [facility] mail order pharmacy.  Will place referral to pharmacy to assist with obtaining until able to get from [facility] as member has not had in over a week.  She state that she did weigh herself a couple yesterday, denies weight today.  Weight yesterday was 299 pounds.  She is advised again of the importance of daily weights.  She is also reminded of low sodium diet to manage heart failure, she verbalizes understanding.   She confirms that home health nursing and physical therapy has started visits, will receive visit today.  She denies any urgent concerns at this time, advised to contact with questions.  Jamie Mason, [place], MSN [facility] Care Management  [facility] Manager [phone]

## 2016-09-29 DIAGNOSIS — I130 Hypertensive heart and chronic kidney disease with heart failure and stage 1 through stage 4 chronic kidney disease, or unspecified chronic kidney disease: Secondary | ICD-10-CM | POA: Diagnosis not present

## 2016-09-29 DIAGNOSIS — I509 Heart failure, unspecified: Secondary | ICD-10-CM | POA: Diagnosis not present

## 2016-09-29 DIAGNOSIS — I872 Venous insufficiency (chronic) (peripheral): Secondary | ICD-10-CM | POA: Diagnosis not present

## 2016-09-29 DIAGNOSIS — E785 Hyperlipidemia, unspecified: Secondary | ICD-10-CM | POA: Diagnosis not present

## 2016-09-29 DIAGNOSIS — I739 Peripheral vascular disease, unspecified: Secondary | ICD-10-CM | POA: Diagnosis not present

## 2016-09-29 DIAGNOSIS — E1122 Type 2 diabetes mellitus with diabetic chronic kidney disease: Secondary | ICD-10-CM | POA: Diagnosis not present

## 2016-09-29 DIAGNOSIS — M6281 Muscle weakness (generalized): Secondary | ICD-10-CM | POA: Diagnosis not present

## 2016-09-29 DIAGNOSIS — I2510 Atherosclerotic heart disease of native coronary artery without angina pectoris: Secondary | ICD-10-CM | POA: Diagnosis not present

## 2016-09-29 DIAGNOSIS — N182 Chronic kidney disease, stage 2 (mild): Secondary | ICD-10-CM | POA: Diagnosis not present

## 2016-10-03 DIAGNOSIS — I739 Peripheral vascular disease, unspecified: Secondary | ICD-10-CM | POA: Diagnosis not present

## 2016-10-03 DIAGNOSIS — E1122 Type 2 diabetes mellitus with diabetic chronic kidney disease: Secondary | ICD-10-CM | POA: Diagnosis not present

## 2016-10-03 DIAGNOSIS — M6281 Muscle weakness (generalized): Secondary | ICD-10-CM | POA: Diagnosis not present

## 2016-10-03 DIAGNOSIS — I130 Hypertensive heart and chronic kidney disease with heart failure and stage 1 through stage 4 chronic kidney disease, or unspecified chronic kidney disease: Secondary | ICD-10-CM | POA: Diagnosis not present

## 2016-10-03 DIAGNOSIS — I509 Heart failure, unspecified: Secondary | ICD-10-CM | POA: Diagnosis not present

## 2016-10-03 DIAGNOSIS — I872 Venous insufficiency (chronic) (peripheral): Secondary | ICD-10-CM | POA: Diagnosis not present

## 2016-10-03 DIAGNOSIS — N182 Chronic kidney disease, stage 2 (mild): Secondary | ICD-10-CM | POA: Diagnosis not present

## 2016-10-03 DIAGNOSIS — I2510 Atherosclerotic heart disease of native coronary artery without angina pectoris: Secondary | ICD-10-CM | POA: Diagnosis not present

## 2016-10-03 DIAGNOSIS — E785 Hyperlipidemia, unspecified: Secondary | ICD-10-CM | POA: Diagnosis not present

## 2016-10-04 ENCOUNTER — Other Ambulatory Visit: Payer: Self-pay | Admitting: *Deleted

## 2016-10-04 ENCOUNTER — Telehealth: Payer: Self-pay | Admitting: *Deleted

## 2016-10-04 NOTE — Telephone Encounter (Signed)
This was started in Feb when acutely ill in ICU. D?C team not certain she needed med long term. I was to discuss risk / benefit at F/U but I have not seen her. She can sch appt [facility] (Thur or Fri PM when I will be attending) to discuss. Or she may keep appt with me..   If she knows risks / benefits and wants to take it, we can see if we have samples. We can see if insurance will pay for a different DOAC. Or Dr Selena Batten can see if [facility] outpt would cover as last resort.

## 2016-10-04 NOTE — Patient Outreach (Signed)
[name]) Care Management  10/04/2016  Jamie Mason March 27, 1941 [id number]   Weekly transition of care call placed to member.  She report that she is doing well, confirms that she has continued to work with home health nursing and physical therapy.  She state she has continued to weigh herself and check blood pressure daily.  State she is "maintaining" her weight, report today 299 pounds, blood pressure "130 something."  Denies shortness of breath or discomfort.  She report compliance with medications with the exception of Eliquis.  She state that she called [facility] pharmacy but was told that it could not be refilled due to not having a prescription number.  She denies contacting the primary MD office for new prescription.  She denies any other concerns at this time.  Call then placed to Internal Medicine clinic, spoke to [place], [place].  Updated on member not taking Eliquis for over a week, request made to contact [employer] to provide new prescription.  Call received back from [facility] stating that [facility] has a current prescription, but the order was canceled earlier this month due to the cost ($326) and not having a card on file.  She is made aware that member is unable to afford, but referral to [facility] pharmacist has been contacted for medication management/assistance.  Spoke to D. Mason, [facility] pharmacist, regarding referral and need for financial assistance.  Request made for MD to call month supply to [place] will provide medication until member has met deductible, but member will be responsible for copay once deductible is met.  Member made aware, however, she report that she will not be able to afford "much of nothing" as she is filing for bankruptcy.  She is concerned about having to be on Eliquis long term as she was initially told that she may only need it for 3-6 months.  Advised to discuss with MD on Thursday.  Call placed back to [facility], she report that per MD,  necessity for ongoing Eliquis will be assessed at visit this week.  If medication remain medically necessary, request made to call into [facility] pharmacy and [facility] will purchase at that time.  Will provide update to Jamie Mason.  Will follow up with member within the next week.  Valente David, [place], MSN [place] [phone]

## 2016-10-04 NOTE — Telephone Encounter (Signed)
Jamie Mason [facility] RN [phone])GEXBMW stating patient has been out of this med for a week now. Was not sent by [facility].  I called [employer] & the rx was cancelled 4/6 bec there was no credit card on file for patient. It will cost her $326 for copay. Patient can not afford to pay. I also asked if it's sent to local [facility] if she will be able to afford it & she said no she will not be able to. Monica requesting few weeks supply for patient. Has appt with pcp 4/26.  Can we help this patient get this med from [facility] pharmacy?

## 2016-10-04 NOTE — Telephone Encounter (Signed)
Dr. Rogelia Boga, patient has appt with you this Thursday 4/26 @ 8:15 am. I have informed Monica [facility]) of your note & she had stated if you decide to order med at appt, [facility] will pay for the cost to [facility] outpatient pharmacy (30 day supply).  Monica-THN [phone] Thanks!

## 2016-10-05 ENCOUNTER — Other Ambulatory Visit: Payer: Self-pay | Admitting: Pharmacist

## 2016-10-05 NOTE — Telephone Encounter (Signed)
Great if you could see pt tomorrow. Will send her your way once appt done. Thanks

## 2016-10-05 NOTE — Telephone Encounter (Signed)
I wonder also if she would qualify for Medicare Extra help program (income limit ~$18,000 for household size of 1, ~$24,000 for household size of 2). I can try to help patient at appointment if needed, please let me know.

## 2016-10-05 NOTE — Patient Outreach (Signed)
Jamie Mason was referred to pharmacy for assistance with obtaining her medication (Eliquis) from mail order. Patient does not answer and I am unable to leave a message as her voicemail box is currently full. If have not heard from patient by 10/07/16, will give her another call at that time.  Duanne Moron, PharmD, [facility] Clinical Pharmacist [facility] Care Management [phone]

## 2016-10-06 ENCOUNTER — Other Ambulatory Visit: Payer: Self-pay | Admitting: *Deleted

## 2016-10-06 ENCOUNTER — Encounter: Payer: Self-pay | Admitting: Internal Medicine

## 2016-10-06 ENCOUNTER — Ambulatory Visit (INDEPENDENT_AMBULATORY_CARE_PROVIDER_SITE_OTHER): Payer: Medicare HMO | Admitting: Internal Medicine

## 2016-10-06 VITALS — BP 131/44 | HR 55 | Temp 98.4°F | Wt 308.2 lb

## 2016-10-06 DIAGNOSIS — I129 Hypertensive chronic kidney disease with stage 1 through stage 4 chronic kidney disease, or unspecified chronic kidney disease: Secondary | ICD-10-CM | POA: Diagnosis not present

## 2016-10-06 DIAGNOSIS — R202 Paresthesia of skin: Secondary | ICD-10-CM | POA: Diagnosis not present

## 2016-10-06 DIAGNOSIS — I25118 Atherosclerotic heart disease of native coronary artery with other forms of angina pectoris: Secondary | ICD-10-CM

## 2016-10-06 DIAGNOSIS — D649 Anemia, unspecified: Secondary | ICD-10-CM

## 2016-10-06 DIAGNOSIS — Z87891 Personal history of nicotine dependence: Secondary | ICD-10-CM

## 2016-10-06 DIAGNOSIS — E1122 Type 2 diabetes mellitus with diabetic chronic kidney disease: Secondary | ICD-10-CM | POA: Diagnosis not present

## 2016-10-06 DIAGNOSIS — Z7982 Long term (current) use of aspirin: Secondary | ICD-10-CM

## 2016-10-06 DIAGNOSIS — I4892 Unspecified atrial flutter: Secondary | ICD-10-CM | POA: Diagnosis not present

## 2016-10-06 DIAGNOSIS — N182 Chronic kidney disease, stage 2 (mild): Secondary | ICD-10-CM | POA: Diagnosis not present

## 2016-10-06 DIAGNOSIS — I2510 Atherosclerotic heart disease of native coronary artery without angina pectoris: Secondary | ICD-10-CM

## 2016-10-06 DIAGNOSIS — I252 Old myocardial infarction: Secondary | ICD-10-CM

## 2016-10-06 DIAGNOSIS — I10 Essential (primary) hypertension: Secondary | ICD-10-CM

## 2016-10-06 DIAGNOSIS — E8881 Metabolic syndrome: Secondary | ICD-10-CM

## 2016-10-06 DIAGNOSIS — E785 Hyperlipidemia, unspecified: Secondary | ICD-10-CM | POA: Diagnosis not present

## 2016-10-06 DIAGNOSIS — R6889 Other general symptoms and signs: Secondary | ICD-10-CM | POA: Diagnosis not present

## 2016-10-06 DIAGNOSIS — B0229 Other postherpetic nervous system involvement: Secondary | ICD-10-CM

## 2016-10-06 DIAGNOSIS — M5136 Other intervertebral disc degeneration, lumbar region: Secondary | ICD-10-CM

## 2016-10-06 DIAGNOSIS — Z79899 Other long term (current) drug therapy: Secondary | ICD-10-CM | POA: Diagnosis not present

## 2016-10-06 DIAGNOSIS — Z6841 Body Mass Index (BMI) 40.0 and over, adult: Secondary | ICD-10-CM

## 2016-10-06 DIAGNOSIS — R0609 Other forms of dyspnea: Secondary | ICD-10-CM

## 2016-10-06 DIAGNOSIS — Z0000 Encounter for general adult medical examination without abnormal findings: Secondary | ICD-10-CM

## 2016-10-06 DIAGNOSIS — Z7984 Long term (current) use of oral hypoglycemic drugs: Secondary | ICD-10-CM | POA: Diagnosis not present

## 2016-10-06 DIAGNOSIS — M519 Unspecified thoracic, thoracolumbar and lumbosacral intervertebral disc disorder: Secondary | ICD-10-CM

## 2016-10-06 LAB — GLUCOSE, CAPILLARY: GLUCOSE-CAPILLARY: 121 mg/dL — AB (ref 65–99)

## 2016-10-06 LAB — POCT GLYCOSYLATED HEMOGLOBIN (HGB A1C): Hemoglobin A1C: 6.8

## 2016-10-06 MED ORDER — GABAPENTIN 100 MG PO CAPS: 200.0000 mg | ORAL_CAPSULE | Freq: Three times a day (TID) | ORAL | 2 refills | Status: DC

## 2016-10-06 MED ORDER — ALBUTEROL SULFATE HFA 108 (90 BASE) MCG/ACT IN AERS: 2.0000 | INHALATION_SPRAY | Freq: Four times a day (QID) | RESPIRATORY_TRACT | 3 refills | Status: DC | PRN

## 2016-10-06 MED ORDER — AMLODIPINE BESYLATE 2.5 MG PO TABS: 2.5000 mg | ORAL_TABLET | Freq: Every day | ORAL | 3 refills | Status: DC

## 2016-10-06 MED ORDER — METFORMIN HCL 500 MG PO TABS: 500.0000 mg | ORAL_TABLET | Freq: Two times a day (BID) | ORAL | 3 refills | Status: DC

## 2016-10-06 NOTE — Assessment & Plan Note (Signed)
This problem is chronic and improved. She was prescribed gabapentin 100 mg 3 times a day for the postherpetic neuralgia. She states that her neck doesn't really hurt and really isn't painful but feels weird. Additionally she complains of numbness and tingling on the fingertips of her right hand which started in January after she had a lot of CBGs while in the hospital. We discussed that gabapentin was only to treat symptoms and that if the symptoms were worrisome, we can increase the dose of gabapentin. We also discussed that if she did not feel the symptoms were bad enough or that the gabapentin was helping, we can stop the gabapentin. She decided she wanted to try an increased dose of gabapentin to see if Her neck in her fingertips.  PLAN : Increase gabapentin to 200 mg 3 times a day

## 2016-10-06 NOTE — Patient Outreach (Signed)
[facility]) Care Management  10/06/2016  Jamie Mason 1941-03-02 [id number]   Call received from member, she report that she was seen at her primary MD office today.  She state that she was provided with samples of Eliquis from the clinic pharmacist, as well as assisted with Extra Help applications for herself and her husband.  She state that she was told to follow up with her cardiologist next week to determine if she will be on the medication long term.  Will update [facility] pharmacist, will follow up with member next week.  [name], [place], MSN [facility] Care Management  [facility] Manager [phone]

## 2016-10-06 NOTE — Assessment & Plan Note (Signed)
This problem is chronic and stable. She is on atorvastatin 40 intensity statin because she has known coronary artery disease. She is tolerating this medication without side effects.  PLAN:  Cont current meds

## 2016-10-06 NOTE — Progress Notes (Signed)
   Subjective:    Patient ID: Jamie Mason, female    DOB: [birth year], 76 y.o.   MRN: [id number]  HPI  Jamie Mason is here for A Fib / Flutter F/U. Please see the A&P for the status of the pt's chronic medical problems.  ROS : per ROS section and in problem oriented charting. All other systems are negative.  PMHx, Soc hx, and / or Fam hx : Lives with husband. Has insurance transportation.  Working with home physical therapy. Got a hospital bed after her discharge other she feels the springs are causing her pain. She is having a gradual to decline and her strength since Thanksgiving and is having to use her hands to pick her up from a chair. This is due to weakness and pain.  Review of Systems  Respiratory: Negative for shortness of breath.   Cardiovascular: Negative for chest pain and palpitations.  Gastrointestinal: Positive for abdominal distention.  Endocrine:       Denies hypoglycemia symptoms  Musculoskeletal: Positive for arthralgias and back pain.  Skin: Positive for rash and wound.       Positive for hair loss  Neurological:       Positive for tingling in the right hand and fingertips       Objective:   Physical Exam  Constitutional: She appears well-developed and well-nourished. No distress.  HENT:  Head: Normocephalic and atraumatic.  Right Ear: External ear normal.  Left Ear: External ear normal.  Nose: Nose normal.  Eyes: Conjunctivae and EOM are normal. Right eye exhibits no discharge. Left eye exhibits no discharge. No scleral icterus.  Cardiovascular: Normal rate.   Murmur heard. Pulmonary/Chest: Effort normal and breath sounds normal. No respiratory distress.  Musculoskeletal:  Positive pitting and nonpitting edema of the lower extremities with hypertrophy and lichenification of the skin  Neurological: She is alert.  Abnormal sensation right fingertips when compared to left fingertips  Skin: Skin is warm and dry. No rash noted. She is not diaphoretic. No  erythema. No pallor.  Hyperpigmented lesions left posterior neck and shoulder  Psychiatric: She has a normal mood and affect. Her behavior is normal. Judgment and thought content normal.          Assessment & Plan:

## 2016-10-06 NOTE — Assessment & Plan Note (Signed)
This problem is chronic and stable. She is on an aspirin, beta blocker, and a statin. She has no chest pain today. Her recent cath results from January 2018 are in the overview.  PLAN:  Cont current meds

## 2016-10-06 NOTE — Assessment & Plan Note (Signed)
This problem is chronic and stable. This occurred when she was in the ICU in January and had a non-STEMI along with severe flu and acute respiratory distress. She was started on amiodarone and was cardioverted successfully. She was discharged on amiodarone and Eliquis. The admitting team in January discussed her with me prior to her discharge as they were not certain whether long-term anticoagulation was indicated. She has been unable to afford the Eliquis as her co-pay is $400. She has been off this for a while. She has pretty good understanding of a flutter and Eliquis and was able to tell me that it was a blood thinner that she needed due to the heart rhythm.  I discussed the risks and benefits today which she was able to understand and wants to remain on oral anticoagulation and find an alternative to Eliquis which she can afford. We discussed that she would stay on this until she sees her cardiologist May 1.  Today, she is in sinus rhythm. She did not feel palpitations while she was in A. fib in the ICU.  I collaborated with are clinical pharmacist who was able to give her samples and get her signed up for Medicare extra care.  PLAN : Eliquis samples To see cardiology May 1

## 2016-10-06 NOTE — Assessment & Plan Note (Signed)
This problem is chronic and stable. Her A1c has been 6.8 - 7.3 - 6.8. Her diabetes regimen is metformin 500 twice a day. She denies any hypoglycemic symptoms. She does not check his CBG at home. She is up to date on all diabetes metrics except for her eye exam. This got delayed due to her 2 hospitalizations earlier this year.  PLAN:  Cont current meds Discuss eye exam next appointment

## 2016-10-06 NOTE — Assessment & Plan Note (Addendum)
I again asked about mammogram. She confirmed that they do hurt and she does not want to get them currently. I will need to ask about colon cancer screening and DEXA at her next appointment.  Today she complains of left fingertip tingling. It is only on the pads of the fingertips and only on digits 2 through 5. She does not have numbness or tingling in the other hand or the feet. I doubt that this is diabetic neuropathy. She is anemic but her MCV is normal although her RDW is elevated. She had a vitamin B12 level 720 in January. The forefingers hip both the ulnar and radial nerves out of the think that this is a more proximal nerve problem. For now, increase gabapentin and follow clinically.

## 2016-10-06 NOTE — Assessment & Plan Note (Signed)
This problem is chronic and stable. Her BMI is just under 50. Her weight has decreased from 325 to just over 300 since January. My guess is this is due to her hospitalization and I am afraid that this is sarcopenia / muscle loss rather than true fat loss.  PLAN:  Cont following weight and nutrition   Wt Readings from Last 3 Encounters:  10/06/16 (!) 308 lb 3.2 oz (139.8 kg)  09/19/16 299 lb 6.4 oz (135.8 kg)  09/14/16 (!) 307 lb (139.3 kg)

## 2016-10-06 NOTE — Assessment & Plan Note (Addendum)
This problem is chronic and stable. She complains of lower back pain and left hip pain but her knee has no pain recently. She states that she can take 2 ibuprofen 2-3 times per week or Tylenol with good relief of her pain. I do not feel additional pharmaceutical treatment is indicated at this time. The over-the-counter medications he is the pain off and allow her to do her therapy. She had imaging of the lumbar spine in 2001 that showed spondylosis. Her left hip film a couple years later did not show any acute disease.  Nonsteroidals would be technically be contraindicated in this 77 year old. Her creatinine has been stable since 2016. She is on Eliquis and an aspirin and does not take a PPI. Topical nonsteroidals are not going to be helpful for her back or hip due to her BMI of 50. There is a risk for GI bleeding and renal insufficiency but at this point, I think the risk are not greater than the benefit of keeping her on ibuprofen no more than 2 or 3 times a week and therefore been able to keep her off stronger medications with known side effects also.  PLAN : Continue over-the-counter Tylenol and ibuprofen.

## 2016-10-06 NOTE — Telephone Encounter (Signed)
Hi Dr. Rogelia Boga, the Xarelto I found was also Tier 3 like Eliquis, so we kept the Eliquis and provided samples. Patient did apply for the Medicare Extra Help for her and her husband, so we will continue to follow up on this and can help with more samples or free voucher to bridge her through. She confirmed understanding.  Thank you

## 2016-10-06 NOTE — Patient Instructions (Addendum)
1. Your sugar is doing great! 2. Your blood pressure is doing well. Your goal would be close to 120/80 but I am OK with your blood pressure of 133/44. If you get dizzy or lightheaded, let me know 3. We are working on a blood thinner that doesn't cost too much. 4. Call your insurance to see if they cover aspirin 5. Call [facility] your bed. 6. See me in 3 months

## 2016-10-06 NOTE — Assessment & Plan Note (Signed)
This problem is chronic and stable. Her blood pressure regimen is Lasix 40 twice a day, Norvasc 2.5 daily, Coreg 25 twice a day. I had previously had her on verapamil 240 instead of the Norvasc because she has diabetic nephropathy but during her hospitalization she was changed back to amlodipine. Her blood pressure is adequately controlled today. I am repeating a urine microalbumin that I would like to get her off the amlodipine and back on verapamil or diltiazem. Her heart rate is 55 today and has arranged from the 60s to the 100s earlier this year. Her heart rate would be the limiting factor for successfully resuming her verapamil. She needs a beta blocker as she has coronary artery disease and a NSTEMI in January 2018 due to supply demand mismatch.   Plan : Continue Norvasc, Coreg, Lasix. Check microalbumin today Check heart rate when she follows up Consider changing amlodipine to verapamil if the heart rate can tolerate due to diabetic nephropathy   BP Readings from Last 3 Encounters:  10/06/16 (!) 131/44  09/19/16 (!) 163/73  09/14/16 124/60

## 2016-10-06 NOTE — Telephone Encounter (Signed)
Thank you :)

## 2016-10-07 ENCOUNTER — Encounter: Payer: Self-pay | Admitting: Internal Medicine

## 2016-10-07 ENCOUNTER — Other Ambulatory Visit: Payer: Self-pay | Admitting: Pharmacist

## 2016-10-07 ENCOUNTER — Ambulatory Visit: Payer: Self-pay | Admitting: Pharmacist

## 2016-10-07 DIAGNOSIS — I2510 Atherosclerotic heart disease of native coronary artery without angina pectoris: Secondary | ICD-10-CM | POA: Diagnosis not present

## 2016-10-07 DIAGNOSIS — I509 Heart failure, unspecified: Secondary | ICD-10-CM | POA: Diagnosis not present

## 2016-10-07 DIAGNOSIS — M6281 Muscle weakness (generalized): Secondary | ICD-10-CM | POA: Diagnosis not present

## 2016-10-07 DIAGNOSIS — N182 Chronic kidney disease, stage 2 (mild): Secondary | ICD-10-CM | POA: Diagnosis not present

## 2016-10-07 DIAGNOSIS — E785 Hyperlipidemia, unspecified: Secondary | ICD-10-CM | POA: Diagnosis not present

## 2016-10-07 DIAGNOSIS — E1122 Type 2 diabetes mellitus with diabetic chronic kidney disease: Secondary | ICD-10-CM | POA: Diagnosis not present

## 2016-10-07 DIAGNOSIS — I872 Venous insufficiency (chronic) (peripheral): Secondary | ICD-10-CM | POA: Diagnosis not present

## 2016-10-07 DIAGNOSIS — I130 Hypertensive heart and chronic kidney disease with heart failure and stage 1 through stage 4 chronic kidney disease, or unspecified chronic kidney disease: Secondary | ICD-10-CM | POA: Diagnosis not present

## 2016-10-07 DIAGNOSIS — I739 Peripheral vascular disease, unspecified: Secondary | ICD-10-CM | POA: Diagnosis not present

## 2016-10-07 LAB — BMP8+ANION GAP
Anion Gap: 19 mmol/L — ABNORMAL HIGH (ref 10.0–18.0)
BUN/Creatinine Ratio: 17 (ref 12–28)
BUN: 22 mg/dL (ref 8–27)
CO2: 24 mmol/L (ref 18–29)
Calcium: 9.5 mg/dL (ref 8.7–10.3)
Chloride: 100 mmol/L (ref 96–106)
Creatinine, Ser: 1.32 mg/dL — ABNORMAL HIGH (ref 0.57–1.00)
GFR, EST AFRICAN AMERICAN: 46 mL/min/{1.73_m2} — AB (ref >59)
GFR, EST NON AFRICAN AMERICAN: 40 mL/min/{1.73_m2} — AB (ref >59)
GLUCOSE: 114 mg/dL — AB (ref 65–99)
Potassium: 4.3 mmol/L (ref 3.5–5.2)
Sodium: 143 mmol/L (ref 134–144)

## 2016-10-07 LAB — MICROALBUMIN / CREATININE URINE RATIO
Creatinine, Urine: 19.4 mg/dL
Microalb/Creat Ratio: <15.5 mg/g creat (ref 0.0–30.0)
Microalbumin, Urine: <3 ug/mL

## 2016-10-07 LAB — TSH: TSH: 3.7 u[IU]/mL (ref 0.450–4.500)

## 2016-10-07 NOTE — Patient Outreach (Signed)
Jamie Mason was referred to pharmacy for assistance with obtaining her medication (Eliquis) from mail order. Outreach attempt #2. Patient does not answer and I am unable to leave a message as her voicemail box is currently full.   Have received a coordination of care message from [facility] letting me know that the patient has a follow up appointment with her cardiologist next week to determine if she will be kept on Eliquis long-term. Also reports that patient was given samples of Eliquis and is working on the extra help application with the [facility] Clinical Pharmacist. Will follow up with patient next week following her cardiology appointment to confirm that she has no further medication concerns/questions.  Duanne Moron, PharmD, [facility] Clinical Pharmacist [facility] Care Management [phone]

## 2016-10-11 ENCOUNTER — Other Ambulatory Visit: Payer: Self-pay | Admitting: *Deleted

## 2016-10-11 NOTE — Patient Outreach (Signed)
[facility]) Care Management  10/11/2016  JESICA GOHEEN [birth year] [id number]   Weekly transition of care call placed to member, no answer.  Unable to leave voice message as mailbox is full.  Will follow up next week.  [name], [place], MSN [facility] Care Management  [facility] Manager [phone]

## 2016-10-13 DIAGNOSIS — I2510 Atherosclerotic heart disease of native coronary artery without angina pectoris: Secondary | ICD-10-CM | POA: Diagnosis not present

## 2016-10-13 DIAGNOSIS — L8990 Pressure ulcer of unspecified site, unspecified stage: Secondary | ICD-10-CM | POA: Diagnosis not present

## 2016-10-13 DIAGNOSIS — J9620 Acute and chronic respiratory failure, unspecified whether with hypoxia or hypercapnia: Secondary | ICD-10-CM | POA: Diagnosis not present

## 2016-10-14 ENCOUNTER — Ambulatory Visit: Payer: Self-pay | Admitting: Pharmacist

## 2016-10-17 ENCOUNTER — Other Ambulatory Visit: Payer: Self-pay | Admitting: Pharmacist

## 2016-10-17 NOTE — Patient Outreach (Signed)
Jamie Mason was referred to pharmacy for assistance with obtaining her medication (Eliquis) from mail order. Outreach attempt #3. Patient does not answer and I am unable to leave a message as her voicemail box is currently full.   [facility] in EP[facility] [name], patient was given samples of Eliquis and is working on the extra help application with the [facility] Clinical Pharmacist.   At this time will send patient outreach letter to attempt contact and send a coordination of care message to [facility]. If I have not heard back from [name] within 14 days, will close pharmacy episode.  Duanne MoronElisabeth Nitish Roes, PharmD, [facility] Clinical Pharmacist [facility] Care Management [phone]

## 2016-10-18 ENCOUNTER — Other Ambulatory Visit: Payer: Self-pay | Admitting: *Deleted

## 2016-10-18 ENCOUNTER — Telehealth: Payer: Self-pay

## 2016-10-18 DIAGNOSIS — I509 Heart failure, unspecified: Secondary | ICD-10-CM | POA: Diagnosis not present

## 2016-10-18 DIAGNOSIS — I739 Peripheral vascular disease, unspecified: Secondary | ICD-10-CM | POA: Diagnosis not present

## 2016-10-18 DIAGNOSIS — M6281 Muscle weakness (generalized): Secondary | ICD-10-CM | POA: Diagnosis not present

## 2016-10-18 DIAGNOSIS — I130 Hypertensive heart and chronic kidney disease with heart failure and stage 1 through stage 4 chronic kidney disease, or unspecified chronic kidney disease: Secondary | ICD-10-CM | POA: Diagnosis not present

## 2016-10-18 DIAGNOSIS — E1122 Type 2 diabetes mellitus with diabetic chronic kidney disease: Secondary | ICD-10-CM | POA: Diagnosis not present

## 2016-10-18 DIAGNOSIS — I2510 Atherosclerotic heart disease of native coronary artery without angina pectoris: Secondary | ICD-10-CM | POA: Diagnosis not present

## 2016-10-18 DIAGNOSIS — E785 Hyperlipidemia, unspecified: Secondary | ICD-10-CM | POA: Diagnosis not present

## 2016-10-18 DIAGNOSIS — N182 Chronic kidney disease, stage 2 (mild): Secondary | ICD-10-CM | POA: Diagnosis not present

## 2016-10-18 DIAGNOSIS — I872 Venous insufficiency (chronic) (peripheral): Secondary | ICD-10-CM | POA: Diagnosis not present

## 2016-10-18 NOTE — Telephone Encounter (Signed)
Have tried to call pt twice, get mailbox full. Called [name] and she will continue to try to call pt also, Jamie Glennmonica will arrange transportation for tomorrow

## 2016-10-18 NOTE — Telephone Encounter (Signed)
I am fine with the verbal order.  With a history of heart failure, the weight gain, and the DOE she definitely needs to be seen in clinic to assess for a change in regimen or even possible admission if the decompensation is severe enough.  I agree with the [facility].  Thank You.

## 2016-10-18 NOTE — Telephone Encounter (Signed)
PT from [facility] calls and ask for VO, VO given to cont PT 2x week for 4 weeks do you agree?  also informs us that:  1)  Pt's weight today is 320lbs- last visit to clinic it was 308 on 4/26.  [name] also visited today from [facility] and states weight was 322 2)  Pt ambulated 3040ft w/ PT and was very short of breath, they stopped. 15 mons later HR was 64 02 SAT 96% BP 128/76 Made appt for tomorrow pm in [facility] Sending to dr Midwifebutcher and attending Please advise

## 2016-10-18 NOTE — Telephone Encounter (Signed)
[facility] requesting to speak with a nurse about pt and VO. Please call back.

## 2016-10-18 NOTE — Patient Outreach (Signed)
[facility]) Care Management   10/18/2016  LIZVETTE LIGHTSEY Nov 09, 1940 [id number]  ALFREIDA STEFFENHAGEN is an 76 y.o. female  Subjective:   Member report that she is "doing alright."  She state she is taking medications as prescribed, denies questions.  She does report some shortness of breath with activity at times, stating "but it don't last long though."  She is to have her next scheduled visit with physical therapist later this afternoon.   Objective:   Review of Systems  Constitutional: Negative.   HENT: Negative.   Eyes: Negative.   Respiratory: Positive for shortness of breath.        Intermittent, with activity  Cardiovascular: Positive for leg swelling.  Gastrointestinal: Negative.   Genitourinary: Negative.   Musculoskeletal: Negative.   Skin: Negative.   Neurological: Negative.   Endo/Heme/Allergies: Negative.   Psychiatric/Behavioral: Negative.     Physical Exam  Constitutional: She is oriented to person, place, and time. She appears well-developed and well-nourished.  Neck: Normal range of motion.  Cardiovascular: Normal rate, regular rhythm and normal heart sounds.   Respiratory: Effort normal and breath sounds normal.  GI: Soft. Bowel sounds are normal.  Musculoskeletal: Normal range of motion.  Neurological: She is alert and oriented to person, place, and time.  Skin: Skin is warm and dry.   BP 134/72 (BP Location: Right Arm, Patient Position: Sitting, Cuff Size: Normal)   Pulse 62   Resp (!) 22   Wt (!) 322 lb (146.1 kg)   SpO2 98%   BMI 55.27 kg/m   Encounter Medications:   Outpatient Encounter Prescriptions as of 10/18/2016  Medication Sig Note  . albuterol (PROVENTIL HFA;VENTOLIN HFA) 108 (90 Base) MCG/ACT inhaler Inhale 2 puffs into the lungs every 6 (six) hours as needed for wheezing or shortness of breath.   Marland Kitchen amiodarone (PACERONE) 200 MG tablet Take 2 tablets (400 mg total) by mouth 2 (two) times daily. 2 tabs twice daily x5 days. Then  1 tab twice daily x2 weeks. Then one tab daily.   Marland Kitchen amLODipine (NORVASC) 2.5 MG tablet Take 1 tablet (2.5 mg total) by mouth daily.   Marland Kitchen apixaban (ELIQUIS) 5 MG TABS tablet Take 1 tablet (5 mg total) by mouth 2 (two) times daily. 09/19/2016: Refill ordered  . ASPIR-LOW 81 MG EC tablet TAKE 1 TABLET EVERY DAY   . atorvastatin (LIPITOR) 40 MG tablet Take 1 tablet (40 mg total) by mouth daily. 09/19/2016: Refill ordered  . carvedilol (COREG) 25 MG tablet Take 1 tablet (25 mg total) by mouth 2 (two) times daily with a meal.   . furosemide (LASIX) 40 MG tablet Take 2 tablets (80 mg total) by mouth daily.   Marland Kitchen gabapentin (NEURONTIN) 100 MG capsule Take 2 capsules (200 mg total) by mouth 3 (three) times daily.   . hydrocerin (EUCERIN) CREA Apply to bilateral LEs after cleansing on Mondays and Thursdays (prior to [employer] application).   . metFORMIN (GLUCOPHAGE) 500 MG tablet Take 1 tablet (500 mg total) by mouth 2 (two) times daily with a meal.    No facility-administered encounter medications on file as of 10/18/2016.     Functional Status:   In your present state of health, do you have any difficulty performing the following activities: 09/13/2016 08/19/2016  Hearing? N N  Vision? N N  Difficulty concentrating or making decisions? N N  Walking or climbing stairs? Y Y  Dressing or bathing? Y N  Doing errands, shopping? [name]  Preparing Food and eating ? - -  Using the Toilet? - -  In the past six months, have you accidently leaked urine? - -  Do you have problems with loss of bowel control? - -  Managing your Medications? - -  Managing your Finances? - -  Housekeeping or managing your Housekeeping? - -  Some recent data might be hidden    Fall/Depression Screening:    Fall Risk  08/16/2016 11/17/2015 07/23/2015  Falls in the past year? Yes No No  Number falls in past yr: 1 - -  Injury with Fall? No - -  Risk for fall due to : History of fall(s);Impaired balance/gait;Impaired mobility Impaired  balance/gait;Impaired mobility Medication side effect  Risk for fall due to (comments): - - -  Follow up Education provided;Falls prevention discussed - -   PHQ 2/9 Scores 08/16/2016 11/17/2015 07/23/2015 04/16/2015 12/11/2014 11/26/2014 10/22/2014  PHQ - 2 Score 1 1 0 0 0 0 0    Assessment:    Met with member at scheduled time.  She is aware that she has completed her transition of care program as she has been out of the hospital for 31 days, but [facility] will continue to be involved for ongoing case management as well as disease management.  This care manager is unable to verify that she has been consistent with daily weights as she has not documented in her [facility] calendar tool book since the last home visit.  Weight today is 322 pounds, last visit on 4/9 was 299 pounds on member's scale.  She again report that she has been taking medication as prescribed, admits that she has not adhered to heart failure diet, stating "I've been snacking....eating chips, popcorn, drinking sodas."  Importance of adhering to heart failure/low sodium diet again discussed.  She verbalizes understanding and how high sodium foods relate to fluid retention, state "I guess I have to do what I have to do and do better."    Re-educated on heart failure zones and importance of daily weights.  Advised again to weigh daily and record in effort to catch weight gain in reasonable timeframe, and be assessed in the office if needed.  Management of fluid intake also discussed.  She state she does drink "a lot" but is unable to report approximate amount of daily intake.  Cardiology appointment scheduled for 6/1, advised to discuss potential fluid restrictions.  She is made aware that this care manager will contact primary MD office to report weight gain and discuss further instructions.  Spoke to H. Buena Irish, [facility], regarding member's weight gain. She state that she has also received a call from [facility], PT regarding same concerns  as well as shortness of breath.  Appointment made for member to come to clinic tomorrow afternoon, however there is concern about transportation as she uses [employer].  Call placed to care management assistant, transportation secured for appointment through [facility].  Call then placed to member to notify her of appointment with transportation provided.  She verbalizes understanding.  Ms. Buena Irish made aware that contact with member was made and she is aware of appointment.    Possibility of telemonitoring discussed with Ms. Buena Irish, this care manager will contact home health and/or insurance company to make request.    Plan:   Will follow up next month with routine home visit.  [facility] CM Care Plan Problem One     Most Recent Value  Care Plan Problem One  Risk for hospital  admission related to heart failure as evidenced by recent hospitalization  Role Documenting the Problem One  Care Management [place] for Problem One  Active  [facility] Long Term Goal (31-90 days)  Member will not be readmitted to hospital within the next 31 days  [facility] Long Term Goal Start Date  09/16/16  [facility] Long Term Goal Met Date  10/18/16  Interventions for Problem One Long Term Goal  Discussed with member the importance of following discharge instructions, including follow up appointments, medications, diet, and compliance with home health involvement, to decrease the risk of readmission  [facility] CM Short Term Goal #1 (0-30 days)  Member will take medications as prescribed for the next 4 weeks  [facility] CM Short Term Goal #1 Start Date  09/16/16  [facility] CM Short Term Goal #1 Met Date  10/18/16  Interventions for Short Term Goal #1  Educated on the importance of taking medications as prescribed.  Medication list reviewed, discussed importance of taking Lasix as dose was increased.  [facility] CM Short Term Goal #2 (0-30 days)  Member will have follow up appointment within the next 2 weeks  [facility] CM Short Term Goal #2 Start  Date  09/16/16  [facility] CM Short Term Goal #2 Met Date  10/18/16  Interventions for Short Term Goal #2  Re-educated on importance of attending follow up appointment todecrease risk of readmission.  Confirmed member has appointment scheduled, advised to call to secure transportation.  [facility] CM Short Term Goal #3 (0-30 days)  Member will weigh self and record readings daily over the next 4 weeks  [facility] CM Short Term Goal #3 Start Date  09/16/16  Interventions for Short Tern Goal #3  Confirmed that member has working scale in the home, educated on the importance of daily weights and monitioring fluid status.     Valente David, [place], MSN [place] [phone]

## 2016-10-19 ENCOUNTER — Other Ambulatory Visit: Payer: Self-pay | Admitting: Internal Medicine

## 2016-10-19 ENCOUNTER — Ambulatory Visit (INDEPENDENT_AMBULATORY_CARE_PROVIDER_SITE_OTHER): Payer: Medicare HMO | Admitting: Internal Medicine

## 2016-10-19 DIAGNOSIS — Z87891 Personal history of nicotine dependence: Secondary | ICD-10-CM

## 2016-10-19 DIAGNOSIS — I5032 Chronic diastolic (congestive) heart failure: Secondary | ICD-10-CM | POA: Diagnosis not present

## 2016-10-19 MED ORDER — FUROSEMIDE 80 MG PO TABS: 80.0000 mg | ORAL_TABLET | Freq: Two times a day (BID) | ORAL | 2 refills | Status: DC

## 2016-10-19 NOTE — Progress Notes (Signed)
   CC: CHF f/up  HPI:  [name] is a 76 y.o. with PMH as listed below is here for Diastolic CHF follow up  Past Medical History:  Diagnosis Date  . Atrial flutter with rapid ventricular response (HCC) 07/11/2016  . CAD (coronary artery disease) 05/04/2011  . Diabetes mellitus without complication (HCC)   . Hyperlipidemia   . Hypertension   . Lumbar disc disease   . Morbid Obesity 05/02/2011  . Non-ST elevation myocardial infarction (NSTEMI), initial episode of care [facility](HCC) 05/02/2011    [facility] RN called yesterday reporting patient having DOE but does not last long enough, +leg swelling. Weight 322 lbs yesterday, compared to 299 lb on 4/9. Has not been following low salt diet.   Today clinic weight 324lb, 4/9 was 299 lb.  Lasix 80mg  daily  Coreg 25mg  bid amio 400mg  bid Amlodipine 2.5mg  daily lipitor 40mg  daily  Has been compliant with lasix, her water intake is not high.  No chest pain.  Review of Systems:   Review of Systems  Constitutional: Negative for chills and fever.  Cardiovascular: Positive for leg swelling. Negative for chest pain, palpitations and orthopnea.  Gastrointestinal: Negative for heartburn, nausea and vomiting.  Genitourinary: Negative for dysuria.  Musculoskeletal: Negative for myalgias.     Physical Exam:  Vitals:   10/19/16 1456  BP: (!) 155/47  Pulse: 65  Temp: 98.3 F (36.8 C)  TempSrc: Oral  SpO2: 100%  Weight: (!) 324 lb 3.2 oz (147.1 kg)   Physical Exam  Constitutional: She appears well-developed and well-nourished.  HENT:  Head: Normocephalic and atraumatic.  Respiratory: Effort normal and breath sounds normal. No respiratory distress. She has no wheezes.  No increased WOB. satting 100% on RA.   GI: Soft.  Musculoskeletal: Normal range of motion.  2+ pitting edema upto knees. Has chronic venous stasis skin changes.      Assessment & Plan:   See Encounters Tab for problem based charting.  Patient discussed with Dr.  Cleda DaubE. Hoffman

## 2016-10-19 NOTE — Patient Instructions (Signed)
Take lasix 80mg  twice a day (gave you new prescription)  Please Avoid ALL salt from your diet.  Weight yourself daily in the morning.  Come back to clinic on Monday for follow up.

## 2016-10-19 NOTE — Assessment & Plan Note (Signed)
Having weight gain of ~25 lb due to dietary indiscretion. Is compliant with lasix but likely needs higher dose. Not having significant resp distress. Lungs clear. Has leg edema but chronic. I don't think she is in severe CHF requiring admission.    Will increase lasix to 80mg  BID Talked in details about dietary compliance with low salt diet. Daily weight at home. f/up Monday for recheck and lab work.

## 2016-10-20 ENCOUNTER — Encounter: Payer: Self-pay | Admitting: Cardiovascular Disease

## 2016-10-20 MED ORDER — FUROSEMIDE 80 MG PO TABS: 80.0000 mg | ORAL_TABLET | Freq: Two times a day (BID) | ORAL | 2 refills | Status: DC

## 2016-10-20 NOTE — Addendum Note (Signed)
Addended by: Hyacinth MeekerAHMED, Yoseline Andersson on: 10/20/2016 04:30 PM   Modules accepted: Orders

## 2016-10-21 ENCOUNTER — Other Ambulatory Visit: Payer: Self-pay | Admitting: Internal Medicine

## 2016-10-21 DIAGNOSIS — N182 Chronic kidney disease, stage 2 (mild): Secondary | ICD-10-CM | POA: Diagnosis not present

## 2016-10-21 DIAGNOSIS — E8881 Metabolic syndrome: Secondary | ICD-10-CM

## 2016-10-21 DIAGNOSIS — I130 Hypertensive heart and chronic kidney disease with heart failure and stage 1 through stage 4 chronic kidney disease, or unspecified chronic kidney disease: Secondary | ICD-10-CM | POA: Diagnosis not present

## 2016-10-21 DIAGNOSIS — I872 Venous insufficiency (chronic) (peripheral): Secondary | ICD-10-CM | POA: Diagnosis not present

## 2016-10-21 DIAGNOSIS — E785 Hyperlipidemia, unspecified: Secondary | ICD-10-CM | POA: Diagnosis not present

## 2016-10-21 DIAGNOSIS — I2510 Atherosclerotic heart disease of native coronary artery without angina pectoris: Secondary | ICD-10-CM | POA: Diagnosis not present

## 2016-10-21 DIAGNOSIS — R0609 Other forms of dyspnea: Principal | ICD-10-CM

## 2016-10-21 DIAGNOSIS — I739 Peripheral vascular disease, unspecified: Secondary | ICD-10-CM | POA: Diagnosis not present

## 2016-10-21 DIAGNOSIS — M6281 Muscle weakness (generalized): Secondary | ICD-10-CM | POA: Diagnosis not present

## 2016-10-21 DIAGNOSIS — E1122 Type 2 diabetes mellitus with diabetic chronic kidney disease: Secondary | ICD-10-CM | POA: Diagnosis not present

## 2016-10-21 DIAGNOSIS — B0229 Other postherpetic nervous system involvement: Secondary | ICD-10-CM

## 2016-10-21 DIAGNOSIS — I509 Heart failure, unspecified: Secondary | ICD-10-CM | POA: Diagnosis not present

## 2016-10-21 MED ORDER — AMLODIPINE BESYLATE 2.5 MG PO TABS: 2.5000 mg | ORAL_TABLET | Freq: Every day | ORAL | 3 refills | Status: DC

## 2016-10-21 MED ORDER — METFORMIN HCL 500 MG PO TABS: 500.0000 mg | ORAL_TABLET | Freq: Two times a day (BID) | ORAL | 3 refills | Status: DC

## 2016-10-21 MED ORDER — GABAPENTIN 100 MG PO CAPS: 200.0000 mg | ORAL_CAPSULE | Freq: Three times a day (TID) | ORAL | 2 refills | Status: DC

## 2016-10-21 MED ORDER — ALBUTEROL SULFATE HFA 108 (90 BASE) MCG/ACT IN AERS: 2.0000 | INHALATION_SPRAY | Freq: Four times a day (QID) | RESPIRATORY_TRACT | 3 refills | Status: DC | PRN

## 2016-10-24 ENCOUNTER — Telehealth: Payer: Self-pay | Admitting: Internal Medicine

## 2016-10-24 ENCOUNTER — Ambulatory Visit: Payer: Medicare HMO

## 2016-10-24 DIAGNOSIS — I509 Heart failure, unspecified: Secondary | ICD-10-CM | POA: Diagnosis not present

## 2016-10-24 DIAGNOSIS — I130 Hypertensive heart and chronic kidney disease with heart failure and stage 1 through stage 4 chronic kidney disease, or unspecified chronic kidney disease: Secondary | ICD-10-CM | POA: Diagnosis not present

## 2016-10-24 DIAGNOSIS — E785 Hyperlipidemia, unspecified: Secondary | ICD-10-CM | POA: Diagnosis not present

## 2016-10-24 DIAGNOSIS — I2510 Atherosclerotic heart disease of native coronary artery without angina pectoris: Secondary | ICD-10-CM | POA: Diagnosis not present

## 2016-10-24 DIAGNOSIS — E1122 Type 2 diabetes mellitus with diabetic chronic kidney disease: Secondary | ICD-10-CM | POA: Diagnosis not present

## 2016-10-24 DIAGNOSIS — I872 Venous insufficiency (chronic) (peripheral): Secondary | ICD-10-CM | POA: Diagnosis not present

## 2016-10-24 DIAGNOSIS — I739 Peripheral vascular disease, unspecified: Secondary | ICD-10-CM | POA: Diagnosis not present

## 2016-10-24 DIAGNOSIS — M6281 Muscle weakness (generalized): Secondary | ICD-10-CM | POA: Diagnosis not present

## 2016-10-24 DIAGNOSIS — N182 Chronic kidney disease, stage 2 (mild): Secondary | ICD-10-CM | POA: Diagnosis not present

## 2016-10-24 NOTE — Telephone Encounter (Signed)
Called home but mailbox full, unable to leave msg.

## 2016-10-24 NOTE — Telephone Encounter (Signed)
What has been her trend on her scales at home from before the lasix was increased to now? I can;'t really compare weight in [facility] to weight on her home scales. thanks

## 2016-10-24 NOTE — Telephone Encounter (Signed)
[facility] [phone], WANTED DR. Rogelia BogaBUTCHER TO KNOW PATIENT'S WEIGHT WAS 320 LBS TODAY

## 2016-10-24 NOTE — Telephone Encounter (Signed)
Last OV wt 324 lbs (5/9), lasix increased 80 mg BID appt in acc 5/18

## 2016-10-25 NOTE — Progress Notes (Signed)
[facility] Attending  Case discussed with Dr. Ahmed at the time of the visit.  We reviewed the resident's history and exam and pertinent patient test results.  I agree with the assessment, diagnosis, and plan of care documented in the resident's note. 

## 2016-10-26 ENCOUNTER — Telehealth: Payer: Self-pay | Admitting: *Deleted

## 2016-10-26 ENCOUNTER — Other Ambulatory Visit: Payer: Self-pay | Admitting: *Deleted

## 2016-10-26 DIAGNOSIS — I5032 Chronic diastolic (congestive) heart failure: Secondary | ICD-10-CM

## 2016-10-26 NOTE — Telephone Encounter (Signed)
Dr Midwifebutcher, monica lane rn thn calls and states that pt can [facility] to keep up with daily weight, BP and 02 sat. If this would benefit the pt can you put in the order otherwise [facility] will need to stop visits because the time is up but since the pt has problems recently w/ wt and office visits they felt this might help

## 2016-10-26 NOTE — Patient Outreach (Signed)
[facility]) Care Management  10/26/2016  Jamie NighGloria C Mason 01/14/1941 [id number]   Call placed to [facility] to inquire about possible telemonitoring for member.  [facility] report that they are no longer involved with member as of the first of May.  She does state that they are able to provide Telehealth services if member's MD was to place order.  Call then placed to Jamie Mason at [facility] to request order.  However, there is question regarding which home health agency member is active with: [facility] vs [facility].  [name] will research and place order accordingly.   Jamie DurieMonica Jovahn Mason, [place], MSN [facility] Care Management  [facility] [phone]

## 2016-10-27 ENCOUNTER — Emergency Department (HOSPITAL_COMMUNITY): Payer: Medicare HMO

## 2016-10-27 ENCOUNTER — Inpatient Hospital Stay (HOSPITAL_COMMUNITY)
Admission: EM | Admit: 2016-10-27 | Discharge: 2016-11-01 | DRG: 291 | Disposition: A | Discharge status: Home Health | Payer: Medicare HMO | Attending: Internal Medicine | Admitting: Internal Medicine

## 2016-10-27 ENCOUNTER — Encounter (HOSPITAL_COMMUNITY): Payer: Self-pay | Admitting: Emergency Medicine

## 2016-10-27 ENCOUNTER — Other Ambulatory Visit: Payer: Self-pay

## 2016-10-27 DIAGNOSIS — I161 Hypertensive emergency: Secondary | ICD-10-CM | POA: Diagnosis not present

## 2016-10-27 DIAGNOSIS — E669 Obesity, unspecified: Secondary | ICD-10-CM

## 2016-10-27 DIAGNOSIS — Z7984 Long term (current) use of oral hypoglycemic drugs: Secondary | ICD-10-CM

## 2016-10-27 DIAGNOSIS — E785 Hyperlipidemia, unspecified: Secondary | ICD-10-CM | POA: Diagnosis present

## 2016-10-27 DIAGNOSIS — Z79899 Other long term (current) drug therapy: Secondary | ICD-10-CM | POA: Diagnosis not present

## 2016-10-27 DIAGNOSIS — I4892 Unspecified atrial flutter: Secondary | ICD-10-CM | POA: Diagnosis not present

## 2016-10-27 DIAGNOSIS — Z880 Allergy status to penicillin: Secondary | ICD-10-CM | POA: Diagnosis not present

## 2016-10-27 DIAGNOSIS — Z6841 Body Mass Index (BMI) 40.0 and over, adult: Secondary | ICD-10-CM | POA: Diagnosis not present

## 2016-10-27 DIAGNOSIS — Z885 Allergy status to narcotic agent status: Secondary | ICD-10-CM

## 2016-10-27 DIAGNOSIS — N179 Acute kidney failure, unspecified: Secondary | ICD-10-CM | POA: Diagnosis not present

## 2016-10-27 DIAGNOSIS — E1122 Type 2 diabetes mellitus with diabetic chronic kidney disease: Secondary | ICD-10-CM | POA: Diagnosis not present

## 2016-10-27 DIAGNOSIS — E872 Acidosis: Secondary | ICD-10-CM | POA: Diagnosis present

## 2016-10-27 DIAGNOSIS — I252 Old myocardial infarction: Secondary | ICD-10-CM | POA: Diagnosis not present

## 2016-10-27 DIAGNOSIS — I509 Heart failure, unspecified: Secondary | ICD-10-CM | POA: Diagnosis not present

## 2016-10-27 DIAGNOSIS — I872 Venous insufficiency (chronic) (peripheral): Secondary | ICD-10-CM | POA: Diagnosis present

## 2016-10-27 DIAGNOSIS — I5033 Acute on chronic diastolic (congestive) heart failure: Secondary | ICD-10-CM | POA: Diagnosis present

## 2016-10-27 DIAGNOSIS — Z7901 Long term (current) use of anticoagulants: Secondary | ICD-10-CM

## 2016-10-27 DIAGNOSIS — Z7982 Long term (current) use of aspirin: Secondary | ICD-10-CM | POA: Diagnosis not present

## 2016-10-27 DIAGNOSIS — J449 Chronic obstructive pulmonary disease, unspecified: Secondary | ICD-10-CM | POA: Diagnosis not present

## 2016-10-27 DIAGNOSIS — Z809 Family history of malignant neoplasm, unspecified: Secondary | ICD-10-CM

## 2016-10-27 DIAGNOSIS — Z888 Allergy status to other drugs, medicaments and biological substances status: Secondary | ICD-10-CM | POA: Diagnosis not present

## 2016-10-27 DIAGNOSIS — Z91018 Allergy to other foods: Secondary | ICD-10-CM

## 2016-10-27 DIAGNOSIS — R0602 Shortness of breath: Secondary | ICD-10-CM | POA: Diagnosis not present

## 2016-10-27 DIAGNOSIS — J9601 Acute respiratory failure with hypoxia: Secondary | ICD-10-CM | POA: Diagnosis not present

## 2016-10-27 DIAGNOSIS — Z823 Family history of stroke: Secondary | ICD-10-CM

## 2016-10-27 DIAGNOSIS — N183 Chronic kidney disease, stage 3 unspecified: Secondary | ICD-10-CM

## 2016-10-27 DIAGNOSIS — I2510 Atherosclerotic heart disease of native coronary artery without angina pectoris: Secondary | ICD-10-CM | POA: Diagnosis present

## 2016-10-27 DIAGNOSIS — N182 Chronic kidney disease, stage 2 (mild): Secondary | ICD-10-CM | POA: Diagnosis not present

## 2016-10-27 DIAGNOSIS — I5032 Chronic diastolic (congestive) heart failure: Secondary | ICD-10-CM

## 2016-10-27 DIAGNOSIS — Z87891 Personal history of nicotine dependence: Secondary | ICD-10-CM | POA: Diagnosis not present

## 2016-10-27 DIAGNOSIS — R069 Unspecified abnormalities of breathing: Secondary | ICD-10-CM | POA: Diagnosis not present

## 2016-10-27 DIAGNOSIS — I110 Hypertensive heart disease with heart failure: Secondary | ICD-10-CM | POA: Diagnosis not present

## 2016-10-27 DIAGNOSIS — I130 Hypertensive heart and chronic kidney disease with heart failure and stage 1 through stage 4 chronic kidney disease, or unspecified chronic kidney disease: Principal | ICD-10-CM | POA: Diagnosis present

## 2016-10-27 DIAGNOSIS — I10 Essential (primary) hypertension: Secondary | ICD-10-CM | POA: Diagnosis present

## 2016-10-27 LAB — I-STAT ARTERIAL BLOOD GAS, ED
ACID-BASE DEFICIT: 4 mmol/L — AB (ref 0.0–2.0)
ACID-BASE EXCESS: 3 mmol/L — AB (ref 0.0–2.0)
BICARBONATE: 24 mmol/L (ref 20.0–28.0)
Bicarbonate: 28.1 mmol/L — ABNORMAL HIGH (ref 20.0–28.0)
O2 SAT: 95 %
O2 Saturation: 96 %
PH ART: 7.429 (ref 7.350–7.450)
PO2 ART: 90 mmHg (ref 83.0–108.0)
TCO2: 26 mmol/L (ref 0–100)
TCO2: 29 mmol/L (ref 0–100)
pCO2 arterial: 58.4 mmHg — ABNORMAL HIGH (ref 32.0–48.0)
pCO2 arterial: 42.4 mmHg (ref 32.0–48.0)
pH, Arterial: 7.222 — ABNORMAL LOW (ref 7.350–7.450)
pO2, Arterial: 82 mmHg — ABNORMAL LOW (ref 83.0–108.0)

## 2016-10-27 LAB — BASIC METABOLIC PANEL
Anion gap: 10 (ref 5–15)
BUN: 17 mg/dL (ref 6–20)
CHLORIDE: 108 mmol/L (ref 101–111)
CO2: 24 mmol/L (ref 22–32)
Calcium: 9 mg/dL (ref 8.9–10.3)
Creatinine, Ser: 1.48 mg/dL — ABNORMAL HIGH (ref 0.44–1.00)
GFR calc Af Amer: 39 mL/min — ABNORMAL LOW (ref >60)
GFR calc non Af Amer: 34 mL/min — ABNORMAL LOW (ref >60)
GLUCOSE: 228 mg/dL — AB (ref 65–99)
POTASSIUM: 3.7 mmol/L (ref 3.5–5.1)
SODIUM: 142 mmol/L (ref 135–145)

## 2016-10-27 LAB — CBC
HEMATOCRIT: 36.1 % (ref 36.0–46.0)
Hemoglobin: 11.5 g/dL — ABNORMAL LOW (ref 12.0–15.0)
MCH: 27.8 pg (ref 26.0–34.0)
MCHC: 31.9 g/dL (ref 30.0–36.0)
MCV: 87.2 fL (ref 78.0–100.0)
Platelets: 353 10*3/uL (ref 150–400)
RBC: 4.14 MIL/uL (ref 3.87–5.11)
RDW: 16.9 % — ABNORMAL HIGH (ref 11.5–15.5)
WBC: 11.5 10*3/uL — ABNORMAL HIGH (ref 4.0–10.5)

## 2016-10-27 LAB — I-STAT TROPONIN, ED: Troponin i, poc: 0.01 ng/mL (ref 0.00–0.08)

## 2016-10-27 LAB — MRSA PCR SCREENING: MRSA by PCR: NEGATIVE

## 2016-10-27 LAB — GLUCOSE, CAPILLARY
GLUCOSE-CAPILLARY: 164 mg/dL — AB (ref 65–99)
GLUCOSE-CAPILLARY: 193 mg/dL — AB (ref 65–99)

## 2016-10-27 LAB — BRAIN NATRIURETIC PEPTIDE: B Natriuretic Peptide: 500.7 pg/mL — ABNORMAL HIGH (ref 0.0–100.0)

## 2016-10-27 MED ORDER — AMLODIPINE BESYLATE 5 MG PO TABS
2.5000 mg | ORAL_TABLET | Freq: Every day | ORAL | Status: DC
  Administered 2016-10-27: 2.5 mg via ORAL
  Filled 2016-10-27 (×2): qty 1

## 2016-10-27 MED ORDER — GABAPENTIN 100 MG PO CAPS
200.0000 mg | ORAL_CAPSULE | Freq: Three times a day (TID) | ORAL | Status: DC
  Administered 2016-10-27 – 2016-11-01 (×14): 200 mg via ORAL
  Filled 2016-10-27 (×15): qty 2

## 2016-10-27 MED ORDER — CARVEDILOL 25 MG PO TABS
25.0000 mg | ORAL_TABLET | Freq: Two times a day (BID) | ORAL | Status: DC
  Administered 2016-10-27 – 2016-11-01 (×10): 25 mg via ORAL
  Filled 2016-10-27: qty 2
  Filled 2016-10-27 (×3): qty 1
  Filled 2016-10-27: qty 2
  Filled 2016-10-27 (×3): qty 1
  Filled 2016-10-27 (×2): qty 2

## 2016-10-27 MED ORDER — ALBUTEROL (5 MG/ML) CONTINUOUS INHALATION SOLN
10.0000 mg/h | INHALATION_SOLUTION | Freq: Once | RESPIRATORY_TRACT | Status: AC
  Administered 2016-10-27: 10 mg/h via RESPIRATORY_TRACT
  Filled 2016-10-27: qty 20

## 2016-10-27 MED ORDER — SODIUM CHLORIDE 0.9 % IV SOLN: 250.0000 mL | INTRAVENOUS | Status: DC | PRN

## 2016-10-27 MED ORDER — NITROGLYCERIN IN D5W 200-5 MCG/ML-% IV SOLN
0.0000 ug/min | INTRAVENOUS | Status: DC
Start: 2016-10-27 — End: 2016-11-01
  Administered 2016-10-27: 5 ug/min via INTRAVENOUS
  Filled 2016-10-27: qty 250

## 2016-10-27 MED ORDER — HYDRALAZINE HCL 50 MG PO TABS
50.0000 mg | ORAL_TABLET | Freq: Four times a day (QID) | ORAL | Status: DC
  Administered 2016-10-27 – 2016-10-31 (×14): 50 mg via ORAL
  Filled 2016-10-27 (×13): qty 1

## 2016-10-27 MED ORDER — SODIUM CHLORIDE 0.9% FLUSH
3.0000 mL | INTRAVENOUS | Status: DC | PRN
  Administered 2016-10-29: 3 mL via INTRAVENOUS
  Filled 2016-10-27: qty 3

## 2016-10-27 MED ORDER — AMIODARONE HCL 200 MG PO TABS
200.0000 mg | ORAL_TABLET | Freq: Every day | ORAL | Status: DC
  Administered 2016-10-27 – 2016-11-01 (×6): 200 mg via ORAL
  Filled 2016-10-27 (×6): qty 1

## 2016-10-27 MED ORDER — ASPIRIN EC 81 MG PO TBEC
81.0000 mg | DELAYED_RELEASE_TABLET | Freq: Every day | ORAL | Status: DC
  Administered 2016-10-27 – 2016-11-01 (×6): 81 mg via ORAL
  Filled 2016-10-27 (×6): qty 1

## 2016-10-27 MED ORDER — ATORVASTATIN CALCIUM 40 MG PO TABS
40.0000 mg | ORAL_TABLET | Freq: Every day | ORAL | Status: DC
  Administered 2016-10-27 – 2016-11-01 (×6): 40 mg via ORAL
  Filled 2016-10-27 (×7): qty 1

## 2016-10-27 MED ORDER — ONDANSETRON HCL 4 MG/2ML IJ SOLN: 4.0000 mg | Freq: Four times a day (QID) | INTRAMUSCULAR | Status: DC | PRN

## 2016-10-27 MED ORDER — APIXABAN 5 MG PO TABS
5.0000 mg | ORAL_TABLET | Freq: Two times a day (BID) | ORAL | Status: DC
  Administered 2016-10-27 – 2016-11-01 (×10): 5 mg via ORAL
  Filled 2016-10-27 (×10): qty 1

## 2016-10-27 MED ORDER — POTASSIUM CHLORIDE 10 MEQ/100ML IV SOLN
10.0000 meq | INTRAVENOUS | Status: AC
  Filled 2016-10-27 (×2): qty 100

## 2016-10-27 MED ORDER — ACETAMINOPHEN 325 MG PO TABS
650.0000 mg | ORAL_TABLET | ORAL | Status: DC | PRN
  Administered 2016-10-29 – 2016-10-31 (×5): 650 mg via ORAL
  Filled 2016-10-27 (×5): qty 2

## 2016-10-27 MED ORDER — POTASSIUM CHLORIDE 10 MEQ/100ML IV SOLN
10.0000 meq | INTRAVENOUS | Status: DC
  Administered 2016-10-27: 10 meq via INTRAVENOUS
  Filled 2016-10-27: qty 100

## 2016-10-27 MED ORDER — IPRATROPIUM-ALBUTEROL 0.5-2.5 (3) MG/3ML IN SOLN
3.0000 mL | Freq: Four times a day (QID) | RESPIRATORY_TRACT | Status: DC
  Administered 2016-10-27 – 2016-10-28 (×2): 3 mL via RESPIRATORY_TRACT
  Filled 2016-10-27 (×2): qty 3

## 2016-10-27 MED ORDER — METHYLPREDNISOLONE SODIUM SUCC 125 MG IJ SOLR
125.0000 mg | Freq: Once | INTRAMUSCULAR | Status: AC
  Administered 2016-10-27: 125 mg via INTRAVENOUS
  Filled 2016-10-27: qty 2

## 2016-10-27 MED ORDER — SODIUM CHLORIDE 0.9% FLUSH
3.0000 mL | Freq: Two times a day (BID) | INTRAVENOUS | Status: DC
  Administered 2016-10-27 – 2016-11-01 (×10): 3 mL via INTRAVENOUS

## 2016-10-27 MED ORDER — POTASSIUM CHLORIDE 10 MEQ/100ML IV SOLN
10.0000 meq | INTRAVENOUS | Status: AC
  Administered 2016-10-27 (×3): 10 meq via INTRAVENOUS
  Filled 2016-10-27 (×2): qty 100

## 2016-10-27 MED ORDER — FUROSEMIDE 10 MG/ML IJ SOLN
80.0000 mg | Freq: Once | INTRAMUSCULAR | Status: AC
  Administered 2016-10-27: 80 mg via INTRAVENOUS
  Filled 2016-10-27: qty 8

## 2016-10-27 MED ORDER — FUROSEMIDE 10 MG/ML IJ SOLN
80.0000 mg | Freq: Three times a day (TID) | INTRAMUSCULAR | Status: DC
  Administered 2016-10-27 – 2016-10-28 (×5): 80 mg via INTRAVENOUS
  Filled 2016-10-27 (×5): qty 8

## 2016-10-27 MED ORDER — INSULIN ASPART 100 UNIT/ML ~~LOC~~ SOLN
0.0000 [IU] | Freq: Three times a day (TID) | SUBCUTANEOUS | Status: DC
  Administered 2016-10-27: 2 [IU] via SUBCUTANEOUS
  Administered 2016-10-28 (×2): 3 [IU] via SUBCUTANEOUS
  Administered 2016-10-28: 2 [IU] via SUBCUTANEOUS
  Administered 2016-10-29: 1 [IU] via SUBCUTANEOUS
  Administered 2016-10-30 – 2016-10-31 (×2): 2 [IU] via SUBCUTANEOUS
  Administered 2016-11-01 (×2): 1 [IU] via SUBCUTANEOUS

## 2016-10-27 NOTE — ED Notes (Signed)
IM at bedside.

## 2016-10-27 NOTE — ED Notes (Signed)
Transferred Pt off of trauma stretcher to provide more room in the bed for Pt. Three tech assist. Pt did not tolerate well. O2 dropped into 80's. RN notified. Full bed change with gown. Pt now resting comfortably O2 93

## 2016-10-27 NOTE — ED Notes (Signed)
Dr. Fayrene FearingJames notified of high BP, no new orders

## 2016-10-27 NOTE — ED Triage Notes (Signed)
Per ems, pt felt weak today, c/o SOB. Pt hx of COPD. Placed on CPAP by ems, 80% on 3L. 92% on CPAP. Tachy at 130. Pt aaox4.

## 2016-10-27 NOTE — H&P (Signed)
Date: 10/27/2016               Patient Name:  Jamie Mason MRN: [id number]  DOB: 02/07/1941 Age / Sex: 76 y.o., female   PCP: Burns SpainButcher, Elizabeth A, MD         Medical Service: Internal Medicine Teaching Service         Attending Physician: Dr. Earl LagosNarendra, Nischal, MD    First Contact: Raoul PitchNeha Verma, MS4 Pager: [phone]  Second Contact: Dr. Sheliah Hatchhristopher Roark Rufo Pager: [phone]       After Hours (After 5p/  First Contact Pager: [phone]  weekends / holidays): Second Contact Pager: [phone]   Chief Complaint: Shortness of breath  History of Present Illness:  [name] is a 76 year old female with a PMHx significant for HFpEF, multivessel CAD, T2DM, atrial flutter s/p DCCV maintained on amiodarone, HLD, HTN, obesity, and chronic venous insufficiency who was brought to the ED via EMS with acute onset of dyspnea. She was in her usual state of health until this morning when she left home for an appointment and did not take any of her morning medications. She suffered worsening dyspnea without any particularly strenuous activity that did not improve with use of her albuterol inhaler. This shortness of breath feels similar to her recent hospitalizations attributed to heart failure in April and January. She has chronic bilateral leg swelling not significantly changed after approximaitely 20lb weight gain early this year. She cannot lie flat and sleeps upright in a chair for months. She denies chest pain, dizziness, or loss of consciousness associated with this event. She denies a prior cough but has some nonproductive coughing since symptom onset.  On arrival to the ED, [name] had an O2 sat of 85% on BIPAP with a blood pressure of 215/113 and tachycardic at 130bpm. She was treated with a nitroglycerin drip, lasix 80mg  IV, nebulized albuterol treatments, and IV solu-medrol. Her blood pressure was decreased to 160s systolic. She was transitioned to 4L/min oxygen by [place] with oxygen saturations maintained  at rest  Meds:  Current Meds  Medication Sig  . albuterol (PROVENTIL HFA;VENTOLIN HFA) 108 (90 Base) MCG/ACT inhaler Inhale 2 puffs into the lungs every 6 (six) hours as needed for wheezing or shortness of breath.  Marland Kitchen. amiodarone (PACERONE) 200 MG tablet Take 2 tablets (400 mg total) by mouth 2 (two) times daily. 2 tabs twice daily x5 days. Then 1 tab twice daily x2 weeks. Then one tab daily.  Marland Kitchen. amLODipine (NORVASC) 2.5 MG tablet Take 1 tablet (2.5 mg total) by mouth daily.  Marland Kitchen. apixaban (ELIQUIS) 5 MG TABS tablet Take 1 tablet (5 mg total) by mouth 2 (two) times daily.  . ASPIR-LOW 81 MG EC tablet TAKE 1 TABLET EVERY DAY  . atorvastatin (LIPITOR) 40 MG tablet Take 1 tablet (40 mg total) by mouth daily.  . carvedilol (COREG) 25 MG tablet Take 1 tablet (25 mg total) by mouth 2 (two) times daily with a meal.  . furosemide (LASIX) 80 MG tablet Take 1 tablet (80 mg total) by mouth 2 (two) times daily. (Patient taking differently: Take 160 mg by mouth 2 (two) times daily. )  . gabapentin (NEURONTIN) 100 MG capsule Take 2 capsules (200 mg total) by mouth 3 (three) times daily.  . hydrocerin (EUCERIN) CREA Apply to bilateral LEs after cleansing on Mondays and Thursdays (prior to [employer] application).  . metFORMIN (GLUCOPHAGE) 500 MG tablet Take 1 tablet (500 mg total) by mouth 2 (two) times daily  with a meal.     Allergies: Allergies as of 10/27/2016 - Review Complete 10/27/2016  Allergen Reaction Noted  . Lisinopril Swelling 08/19/2014  . Ace inhibitors Other (See Comments) 08/19/2014  . Codeine Itching and Rash 10/27/2016  . Penicillins Itching and Rash 05/02/2011   Past Medical History:  Diagnosis Date  . Atrial flutter with rapid ventricular response (HCC) 07/11/2016  . CAD (coronary artery disease) 05/04/2011  . Diabetes mellitus without complication (HCC)   . Hyperlipidemia   . Hypertension   . Lumbar disc disease   . Morbid Obesity 05/02/2011  . Non-ST elevation myocardial infarction  (NSTEMI), initial episode of care [facility]) 05/02/2011    Family History: Mother - Stroke      Father - Cancer  Social History: Lives at home with her blind husband and adult daughter     She is a former smoker with 50 yr history of smoking 1/2 ppd  Review of Systems: Review of Systems  Constitutional: Negative for chills and fever.  Eyes: Negative for blurred vision.  Respiratory: Positive for cough. Negative for sputum production and wheezing.   Cardiovascular: Positive for orthopnea and leg swelling. Negative for chest pain.  Gastrointestinal: Negative for nausea and vomiting.  Genitourinary: Negative for dysuria.  Musculoskeletal: Negative for falls.  Skin: Negative for rash.  Neurological: Negative for dizziness and headaches.  Psychiatric/Behavioral: The patient does not have insomnia.   All other systems reviewed and are negative.   Physical Exam: Blood pressure (!) 145/70, pulse 70, temperature 98 F (36.7 C), temperature source Oral, resp. rate 19, height 5\' 5"  (1.651 m), weight (!) 307 lb 15.7 oz (139.7 kg), SpO2 95 %. GENERAL- Obese woman sitting upright in hospital bed, uncomfortable, without acute distress HEENT- Moist mucous membranes, nasal cannula in place, accessory muscle recruitment for breathing CARDIAC- RRR, no murmurs, rubs or gallops. RESP- Bilateral inspiratory crackles with moderately increased work of breathing and some scattered wheezes EXTREMITIES- Well healing chronic leg ulcers, woody skin changes without significant pitting edema in lower legs  EKG: Sinus tachycardia with a regular rhythm, abnormal repolarization suggesting ischemia, different from previous EKGs with abnormal repolarization  CXR: Diffuse airspace opacities with hilar fullness, no obvious pleural effusions.  Assessment & Plan by Problem: #Acute exacerbation of congestive heart failure (HCC) #Hypertension She presents with a very acute decompensation with hypoxia and hypercapnea on  presentation to the ED. There was no progressive worsening of symptoms to suggest increased edema as a primary cause of this event. She is not in atrial flutter as with earlier this year. Most likely her severe hypertension worsened by missing medication led to this rapid onset of pulmonary edema. I suspect symptoms with improve rapidly with adequate blood pressure control, setting a goal <170 SBP at this time.  She has responded quickly to intravenous nitroglycerin for blood pressure control with improving ABGs and de-escalation of care from bipap to supplemental O2 only. We will treat initially with lasix 80mg  IV TID and aggressive blood pressure control on nitro drip plus starting oral hydralazine. We will continue home coreg and amlodipine. We will replace and monitor electrolytes during this treatment. I do not think a new echocardiogram is important with extensive recent workup earlier this year.  #AKI Her current mild AKI is not due to intravascular depletion. I suspect worsened cardiac function due to severe hypertension and venous congestion. We will check tomorrow.  #Multivessel CAD (coronary artery disease) Previous NSTEMI in January 2018 with LHC. She has no chest pain or  suggestive EKG changes today. We are continuing home ASA, atorvastatin.  #Type 2 diabetes mellitus with stage 2 chronic kidney disease (HCC) On home metformin only, held now for AKI and planned diuresis. CBG monitoring with sensitive sliding scale insulin for now.  #Hx of atrial flutter sp DCCV now on anticoagulation Sinus rhythm on EKG today. She is on home eliquis and amiodarone, continue here.   Dispo: Admit patient to Inpatient with expected length of stay greater than 2 midnights.  Signed: Fuller Plan, MD PGY-II Internal Medicine Resident Pager# [phone] 10/27/2016, 6:35 PM

## 2016-10-27 NOTE — ED Triage Notes (Signed)
Pt c/o chest tightness

## 2016-10-27 NOTE — H&P (Signed)
Date: 10/27/2016               Patient Name:  Jamie Mason MRN: [id number]  DOB: 29-Nov-1940 Age / Sex: 76 y.o., female   PCP: Burns Spain, MD              Medical Service: Internal Medicine Teaching Service              Attending Physician: Dr. Earl Lagos, MD    First Contact: Raoul Pitch, MS4 Pager: [phone]  Second Contact: Dr. Sheliah Hatch Pager: [phone]  Third Contact Dr. N/A Pager: [phone]       After Hours (After 5p/  First Contact Pager: [phone]  weekends / holidays): Second Contact Pager: [phone]   Chief Complaint:  Shortness of breath  History of Present Illness: Jamie Mason is a 76 year old female with a past medical history of chronic heart failure with preserved ejection fraction, diffuse coronary artery disease, type 2 diabetes, atrial flutter status post cardioversion, hyperlipidemia, hypertension, and chronic venous insufficiency who presents to the ED via EMS with shortness of breath. She explains that she was in her usual state of health until this morning, when she left her home to go to an appointment. She generally takes her regular medications, including her blood pressure medications, in the morning but did not take them before leaving today. Soon after leaving her home, she began to experience intense difficulty breathing. She used her albuterol inhaler (~3 puffs) without relief and proceeded to call EMS at that time. The shortness of breath was accompanied by a dry cough, but she did not experience any chest pain.  Jamie Mason states that she has been compliant with all of her medications, including her daily Lasix. She is not on oxygen at home. She denies any recent changes in her diet or increased sodium intake. She ate her usual dinner of two tomato sandwiches last night. She slept well sitting up last night, which she states is how she usually sleeps. She states that she always has some degree of swelling in her legs and that it is difficult  to tell if this has worsened recently. She denies recent fever/chills or nausea/vomtiting.  Jamie Mason was admitted last month (4/2-4/4) for acute on chronic heart failure with preserved ejection fraction; during that hospitalization, she was diuresed with IV furosemide with good clinical response. She states that her current shortness of breath feels similar to what she was experiencing upon presentation to the hospital in April.  On arrival to the ED, Jamie Mason was found to have an O2 sat of 85% on BiPAP and a blood pressure of 215/113. She was placed on a nitroglycerin drip and given lasix 80mg  IV as well as nebulized albuterol treatments and IV solu-medrol. Her blood pressure improved to ~160s systolic. Her O2 sats improved as well and she was transitioned to nasal cannula.  Meds: Current Facility-Administered Medications  Medication Dose Route Frequency Provider Last Rate Last Dose  . nitroGLYCERIN 50 mg in dextrose 5 % 250 mL (0.2 mg/mL) infusion  0-200 mcg/min Intravenous Titrated Rolland Porter, MD 3 mL/hr at 10/27/16 1307 10 mcg/min at 10/27/16 1307   Current Outpatient Prescriptions  Medication Sig Dispense Refill  . albuterol (PROVENTIL HFA;VENTOLIN HFA) 108 (90 Base) MCG/ACT inhaler Inhale 2 puffs into the lungs every 6 (six) hours as needed for wheezing or shortness of breath. 3 Inhaler 3  . amiodarone (PACERONE) 200 MG tablet Take 2 tablets (400 mg total) by mouth 2 (two)  times daily. 2 tabs twice daily x5 days. Then 1 tab twice daily x2 weeks. Then one tab daily. 60 tablet 2  . amLODipine (NORVASC) 2.5 MG tablet Take 1 tablet (2.5 mg total) by mouth daily. 90 tablet 3  . apixaban (ELIQUIS) 5 MG TABS tablet Take 1 tablet (5 mg total) by mouth 2 (two) times daily. 180 tablet 3  . ASPIR-LOW 81 MG EC tablet TAKE 1 TABLET EVERY DAY 90 tablet 3  . atorvastatin (LIPITOR) 40 MG tablet Take 1 tablet (40 mg total) by mouth daily. 90 tablet 3  . carvedilol (COREG) 25 MG tablet Take 1 tablet (25  mg total) by mouth 2 (two) times daily with a meal. 60 tablet 2  . furosemide (LASIX) 80 MG tablet Take 1 tablet (80 mg total) by mouth 2 (two) times daily. (Patient taking differently: Take 160 mg by mouth 2 (two) times daily. ) 60 tablet 2  . gabapentin (NEURONTIN) 100 MG capsule Take 2 capsules (200 mg total) by mouth 3 (three) times daily. 180 capsule 2  . hydrocerin (EUCERIN) CREA Apply to bilateral LEs after cleansing on Mondays and Thursdays (prior to [employer] application). 113 g 0  . metFORMIN (GLUCOPHAGE) 500 MG tablet Take 1 tablet (500 mg total) by mouth 2 (two) times daily with a meal. 180 tablet 3    Allergies: Allergies as of 10/27/2016 - Review Complete 10/27/2016  Allergen Reaction Noted  . Lisinopril Swelling 08/19/2014  . Ace inhibitors Other (See Comments) 08/19/2014  . Codeine Itching and Rash 10/27/2016  . Penicillins Itching and Rash 05/02/2011   Past Medical History:  Diagnosis Date  . Atrial flutter with rapid ventricular response (HCC) 07/11/2016  . CAD (coronary artery disease) 05/04/2011  . Diabetes mellitus without complication (HCC)   . Hyperlipidemia   . Hypertension   . Lumbar disc disease   . Morbid Obesity 05/02/2011  . Non-ST elevation myocardial infarction (NSTEMI), initial episode of care [facility]) 05/02/2011   Past Surgical History:  Procedure Laterality Date  . CARDIAC CATHETERIZATION N/A 07/11/2016   Procedure: Left Heart Cath and Coronary Angiography;  Surgeon: [name], MD;  Location: [facility] INVASIVE CV LAB;  Service: Cardiovascular;  Laterality: N/A;  . CARDIOVERSION N/A 07/13/2016   Procedure: CARDIOVERSION;  Surgeon: Wendall Stade, MD;  Location: [facility] ENDOSCOPY;  Service: Cardiovascular;  Laterality: N/A;  . LEFT HEART CATHETERIZATION WITH CORONARY ANGIOGRAM N/A 05/03/2011   Procedure: LEFT HEART CATHETERIZATION WITH CORONARY ANGIOGRAM;  Surgeon: Othella Boyer, MD;  Location[facility] CATH LAB;  Service: Cardiovascular;  Laterality: N/A;         Family History  Problem Relation Age of Onset  . Cancer Father   . Stroke Mother    Social History  Ms. Mcguinn lives in [place] with her husband and adult daughter. Her husband is blind. She has two other daughters and a son who live elsewhere. She smoked ~.5 pack a day for ~50 years but quit 7-8 years ago. She does not drink alcohol. She denies any other drug use.  Review of Systems: As per HPI.  Physical Exam: Blood pressure (!) 165/72, pulse 81, temperature (!) 96.8 F (36 C), temperature source Axillary, resp. rate 20, height 5\' 7"  (1.702 m), weight (!) 147 kg (324 lb), SpO2 (!) 88 %. General: Obese female sitting up in bed. She is on nasal canula and conversing well, but does pause frequently to catch her breath. HEENT: Moist mucus membranes.  Cardiac: Regular rate and rhythm. No murmurs, rubs,  or gallops. Pulmonary: Increased work of breathing. Diffuse wheezing and basilar crackles heard bilaterally. Abdominal: Soft, non-distended, non-tender. Extremities: Warm and dry. Minimal peripheral pitting edema. Healing venous ulcers over the medial malleoli bilaterally.  Lab results: Basic Metabolic Panel:  Recent Labs  16/10/96 1222  NA 142  K 3.7  CL 108  CO2 24  GLUCOSE 228*  BUN 17  CREATININE 1.48*  CALCIUM 9.0   CBC:  Recent Labs  10/27/16 1137  WBC 11.5*  HGB 11.5*  HCT 36.1  MCV 87.2  PLT 353   Cardiac Enzymes: Troponin I, poc: 0.01  BNP: 500.7  Imaging results:  Dg Chest Portable 1 View  Result Date: 10/27/2016 COMPARISON: 09/12/2016  FINDINGS: Cardiomegaly and chronic fullness of the hilar vessels. Diffuse interstitial and airspace opacity, symmetric. No definitive pleural effusion. No pneumothorax. IMPRESSION: CHF pattern  Other results: EKG: Sinus tachycardia. No ischemic changes.  Problem List: -- Acute hypoxic respiratory failure -- Acute on chronic heart failure with preserved ejection fraction -- Hypertension -- Diffuse coronary  artery disease -- AKI -- T2DM -- Atrial flutter status post cardioversion -- Hyperlipidemia -- Obesity -- Chronic venous insufficiency   Ms. Wheeler is a 76 year old female with a past medical history of chronic heart failure with preserved ejection fraction, diffuse coronary artery disease, type 2 diabetes, atrial flutter status post cardioversion, hyperlipidemia, hypertension, and chronic venous insufficiency who presents to the ED via EMS with shortness of breath and is found to have bibasilar crackles and peripheral edema on exam, a BNP of 500.2, and a CXR with cardiomegaly + diffuse, symmetric interstitial and airspace opacity.  Assessment and Plan By Problem: #Acute hypoxic respiratory failure #Acute on chronic heart failure with preserved ejection fraction #Hypertension Given her bibasilar crackles and peripheral edema on physical exam, BNP of 500.2 (was 592.8 at the time of her last admission for acute on chronic heart failure), and CXR with cardiomegaly + diffuse, symmetric interstitial and airspace opacity, Ms. Bernards's symptoms are very consistent with acute on chronic heart failure. In addition to these findings, she states that her current symptoms feel similar to what she experienced at the time of her prior admission for acute on chronic heart failure. Her most recent echocardiogram in January 2018 showed EF 60-65%, grade 2 diastolic dysfunction. It is unclear what caused this likely exacerbation; she has not had any recent changes in her lifestyle or diet and states that she has been compliant with her medication regimen. It is likely that her very high blood pressure this morning was the inciting factor, which would be consistent with the rapidity of symptom onset. We will continue blood pressure control and also diurese with IV Lasix. She does not have a formal diagnosis of asthma/COPD, but she does have a long history of smoking and has been prescribed an albuterol inhaler to use as  needed for wheezing; will plan to give Duonebs treatments for symptomatic relief. -- Continue on nasal cannula for now, escalate to BiPAP if necessary -- IV Lasix 80mg  3 times daily -- Nitro drip -- Continue home amlodipine 2.5mg  daily -- Continue home carvedilol 25mg  twice daily -- Duonebs -- Strict Is&Os; daily weights -- Closely monitor BMP + Mg and to ensure that K and Mg stay above 4 and 2 respectively; replete as needed  #Diffuse coronary artery disease History of NSTEMI secondary to demand ischemia during hospitalization from 07/06/16-07/16/16. Left heart catheterization at that time showed chronic total occlusion of mid RCA, moderate disease of LMCA, LAD, and left  circumflex. Initial troponin today 0.01; EKG without ischemic changes. -- Continue home aspirin 81mg  daily -- Continue home carvedilol 25mg  twice daily -- Continue home atorvastatin 40mg  daily  #AKI Creatinine of 1.48 upon admission. Baseline appears to be ~1.1-1.2. Will monitor closely and avoid nephrotoxic medications. -- Repeat BMP tomorrow am  #T2DM Most recent Hgb A1c 7.3 in January 2018. On metformin 500mg  twice daily at home. Will hold for now given that her creatinine is currently elevated from baseline and she is undergoing diuresis. -- Monitor CBGs q4 -- SSI  #Atrial flutter status post cardioversion Status post cardioversion on 07/07/16. EKG on admission shows sinus tachycardia. -- Telemetry -- Continue home amiodarone 200mg  daily -- Continue home Eliquis 5mg  twice daily  #Hyperlipidemia -- Continue home atorvastatin 40mg  daily  #Chronic venous insufficiency Chronic ulcers on medical malleoli appear to be healing. Lower extremity edema expected to improve with diuresis. Will monitor and offer supportive care as needed.  FENGI: NPO DVT PPX: Home Eliquis Code: Full Dispo: Admitted to the Internal Medicine Teaching Service for further management. Anticipated discharge pending clinical improvement, likely  2-3 days.   This is a Psychologist, occupationalMedical Student Note.  The care of the patient was discussed with Dr. Dimple Caseyice and the assessment and plan was formulated with their assistance.  Please see their note for official documentation of the patient encounter.   Signed: Raoul PitchVerma, Kennard Fildes, Medical Student 10/27/2016, 2:24 PM

## 2016-10-27 NOTE — ED Provider Notes (Signed)
[facility]-EMERGENCY DEPT Provider Note   CSN: [id number] Arrival date & time: 10/27/16  1126     History   Chief Complaint Chief Complaint  Patient presents with  . Shortness of Breath    HPI Jamie Mason is a 76 y.o. female. CC:  Difficulty breathing. Patient has a history of paroxysmal atrial flutter, COPD/asthma, and congestive heart failure. She states she is compliant with her medications. She's had worsening shortness of breath the last 2 days. Acutely worse upon awakening this morning. Not on home O2. She tried a nebulizer treatment at home with no improvement. Paramedics arrived to find her on 2 L nasal cannula O2 with fire. She was 80% on room air. Globally diminished breath sounds, marked peripheral edema, and marked dyspnea.  She denies fever. Patient denies chest pain. States her legs are "more swollen".  HPI:   Past Medical History:  Diagnosis Date  . Atrial flutter with rapid ventricular response (HCC) 07/11/2016  . CAD (coronary artery disease) 05/04/2011  . Diabetes mellitus without complication (HCC)   . Hyperlipidemia   . Hypertension   . Lumbar disc disease   . Morbid Obesity 05/02/2011  . Non-ST elevation myocardial infarction (NSTEMI), initial episode of care [facility]) 05/02/2011    Patient Active Problem List   Diagnosis Date Noted  . Acute exacerbation of congestive heart failure (HCC) 10/27/2016  . Chronic diastolic CHF (congestive heart failure) (HCC) 10/19/2016  . Post herpetic neuralgia 08/19/2016  . Atrial flutter (HCC) 07/11/2016  . Chronic venous insufficiency 06/30/2016  . Goals of care, counseling/discussion 07/24/2015  . Severe obesity (BMI >= 40) (HCC) 08/28/2014  . 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 Dyspnea on exertion 03/19/2014  . Healthcare maintenance 03/19/2014  . CAD (coronary artery disease) 05/03/2011  . Hypertension 05/02/2011  . Hyperlipidemia 05/02/2011  . Lumbar disc disease  05/02/2011    Past Surgical History:  Procedure Laterality Date  . CARDIAC CATHETERIZATION N/A 07/11/2016   Procedure: Left Heart Cath and Coronary Angiography;  Surgeon: [name], MD;  Location: [facility] INVASIVE CV LAB;  Service: Cardiovascular;  Laterality: N/A;  . CARDIOVERSION N/A 07/13/2016   Procedure: CARDIOVERSION;  Surgeon: Wendall Stade, MD;  Location: [facility] ENDOSCOPY;  Service: Cardiovascular;  Laterality: N/A;  . LEFT HEART CATHETERIZATION WITH CORONARY ANGIOGRAM N/A 05/03/2011   Procedure: LEFT HEART CATHETERIZATION WITH CORONARY ANGIOGRAM;  Surgeon: Othella Boyer, MD;  Location: [facility] CATH LAB;  Service: Cardiovascular;  Laterality: N/A;  . none      OB History    No data available       Home Medications    Prior to Admission medications   Medication Sig Start Date End Date Taking? Authorizing Provider  albuterol (PROVENTIL HFA;VENTOLIN HFA) 108 (90 Base) MCG/ACT inhaler Inhale 2 puffs into the lungs every 6 (six) hours as needed for wheezing or shortness of breath. 10/21/16  Yes Burns Spain, MD  amiodarone (PACERONE) 200 MG tablet Take 2 tablets (400 mg total) by mouth 2 (two) times daily. 2 tabs twice daily x5 days. Then 1 tab twice daily x2 weeks. Then one tab daily. 07/16/16  Yes Reymundo Poll, MD  amLODipine (NORVASC) 2.5 MG tablet Take 1 tablet (2.5 mg total) by mouth daily. 10/21/16  Yes Burns Spain, MD  apixaban (ELIQUIS) 5 MG TABS tablet Take 1 tablet (5 mg total) by mouth 2 (two) times daily. 09/06/16  Yes Burns Spain, MD  ASPIR-LOW 81 MG EC tablet  TAKE 1 TABLET EVERY DAY 06/15/16  Yes Burns Spain, MD  atorvastatin (LIPITOR) 40 MG tablet Take 1 tablet (40 mg total) by mouth daily. 09/06/16  Yes Burns Spain, MD  carvedilol (COREG) 25 MG tablet Take 1 tablet (25 mg total) by mouth 2 (two) times daily with a meal. 07/16/16  Yes Reymundo Poll, MD  furosemide (LASIX) 80 MG tablet Take 1 tablet (80 mg total) by mouth 2 (two)  times daily. Patient taking differently: Take 160 mg by mouth 2 (two) times daily.  10/20/16  Yes Ahmed, Elyn Peers, MD  gabapentin (NEURONTIN) 100 MG capsule Take 2 capsules (200 mg total) by mouth 3 (three) times daily. 10/21/16 10/21/17 Yes Burns Spain, MD  hydrocerin (EUCERIN) CREA Apply to bilateral LEs after cleansing on Mondays and Thursdays (prior to [employer] application). 06/30/16  Yes Noralee Stain Chahn-Yang, DO  metFORMIN (GLUCOPHAGE) 500 MG tablet Take 1 tablet (500 mg total) by mouth 2 (two) times daily with a meal. 10/21/16  Yes Burns Spain, MD    Family History Family History  Problem Relation Age of Onset  . Cancer Father   . Stroke Mother     Social History Social History  Substance Use Topics  . Smoking status: Former Smoker    Packs/day: 0.50    Years: 50.00    Types: Cigarettes    Quit date: 04/01/2011  . Smokeless tobacco: Never Used  . Alcohol use No     Allergies   Lisinopril; Ace inhibitors; Codeine; and Penicillins   Review of Systems Review of Systems  Constitutional: Negative for appetite change, chills, diaphoresis, fatigue and fever.  HENT: Negative for mouth sores, sore throat and trouble swallowing.   Eyes: Negative for visual disturbance.  Respiratory: Positive for cough and shortness of breath. Negative for chest tightness and wheezing.   Cardiovascular: Positive for leg swelling. Negative for chest pain.  Gastrointestinal: Negative for abdominal distention, abdominal pain, diarrhea, nausea and vomiting.  Endocrine: Negative for polydipsia, polyphagia and polyuria.  Genitourinary: Negative for dysuria, frequency and hematuria.  Musculoskeletal: Negative for gait problem.  Skin: Negative for color change, pallor and rash.  Neurological: Negative for dizziness, syncope, light-headedness and headaches.  Hematological: Does not bruise/bleed easily.  Psychiatric/Behavioral: Negative for behavioral problems and confusion.      Physical Exam Updated Vital Signs BP (!) 165/72   Pulse 81   Temp (!) 96.8 F (36 C) (Axillary)   Resp 20   Ht 5\' 7"  (1.702 m)   Wt (!) 324 lb (147 kg)   SpO2 (!) 88%   BMI 50.75 kg/m   Physical Exam  Constitutional: She is oriented to person, place, and time. She appears well-developed and well-nourished. No distress.  76 year old female. Sitting upright. Dyspneic. On BiPAP.  HENT:  Head: Normocephalic.  Eyes: Conjunctivae are normal. Pupils are equal, round, and reactive to light. No scleral icterus.  Neck: Normal range of motion. Neck supple. No thyromegaly present.  Cardiovascular: Normal rate and regular rhythm.  Exam reveals no gallop and no friction rub.   No murmur heard. Tachycardic. Sinus tach on the monitor. JVD. Symmetric bilateral lower extremity edema.  Pulmonary/Chest: No respiratory distress. She has no wheezes. She has no rales.  Restaurant distress. Tachypnea. On BiPAP. Globally diminished breath sounds.  Abdominal: Soft. Bowel sounds are normal. She exhibits no distension. There is no tenderness. There is no rebound.  Musculoskeletal: Normal range of motion.  Neurological: She is alert and oriented to person, place, and  time.  Skin: Skin is warm and dry. No rash noted.  Psychiatric: She has a normal mood and affect. Her behavior is normal.     ED Treatments / Results  Labs (all labs ordered are listed, but only abnormal results are displayed) Labs Reviewed  CBC - Abnormal; Notable for the following:       Result Value   WBC 11.5 (*)    Hemoglobin 11.5 (*)    RDW 16.9 (*)    All other components within normal limits  BRAIN NATRIURETIC PEPTIDE - Abnormal; Notable for the following:    B Natriuretic Peptide 500.7 (*)    All other components within normal limits  BASIC METABOLIC PANEL - Abnormal; Notable for the following:    Glucose, Bld 228 (*)    Creatinine, Ser 1.48 (*)    GFR calc non Af Amer 34 (*)    GFR calc Af Amer 39 (*)    All other  components within normal limits  I-STAT ARTERIAL BLOOD GAS, ED - Abnormal; Notable for the following:    pH, Arterial 7.222 (*)    pCO2 arterial 58.4 (*)    Acid-base deficit 4.0 (*)    All other components within normal limits  I-STAT TROPOININ, ED    EKG  EKG Interpretation None       Radiology Dg Chest Portable 1 View  Result Date: 10/27/2016 CLINICAL DATA:  Shortness of breath EXAM: PORTABLE CHEST 1 VIEW COMPARISON:  09/12/2016 FINDINGS: Cardiomegaly and chronic fullness of the hilar vessels. Diffuse interstitial and airspace opacity, symmetric. No definitive pleural effusion. No pneumothorax. IMPRESSION: CHF pattern Electronically Signed   By: Marnee Spring M.D.   On: 10/27/2016 12:03    Procedures Procedures (including critical care time)  Medications Ordered in ED Medications  nitroGLYCERIN 50 mg in dextrose 5 % 250 mL (0.2 mg/mL) infusion (10 mcg/min Intravenous Rate/Dose Change 10/27/16 1307)  albuterol (PROVENTIL,VENTOLIN) solution continuous neb (10 mg/hr Nebulization Given 10/27/16 1214)  methylPREDNISolone sodium succinate (SOLU-MEDROL) 125 mg/2 mL injection 125 mg (125 mg Intravenous Given 10/27/16 1200)  furosemide (LASIX) injection 80 mg (80 mg Intravenous Given 10/27/16 1200)     Initial Impression / Assessment and Plan / ED Course  I have reviewed the triage vital signs and the nursing notes.  Pertinent labs & imaging results that were available during my care of the patient were reviewed by me and considered in my medical decision making (see chart for details).   patient placed on nitroglycerin drip. Given Lasix 80 mg IV. Continued on BiPAP with nebulized up-year-old treatments and IV Solu-Medrol. She does diurese here several 100 mL. Blood pressure improves from 220 systolic to 1:30. Worker breathing has diminished. We are trialing her off of BiPAP. I discussed the case with internal medicine residency team. She is followed at the clinic. They're here  planning admission.  CRITICAL CARE Performed by: Rolland Porter JOSEPH   Total critical care time: 85 minutes  Critical care time was exclusive of separately billable procedures and treating other patients.  Critical care was necessary to treat or prevent imminent or life-threatening deterioration.  Critical care was time spent personally by me on the following activities: development of treatment plan with patient and/or surrogate as well as nursing, discussions with consultants, evaluation of patient's response to treatment, examination of patient, obtaining history from patient or surrogate, ordering and performing treatments and interventions, ordering and review of laboratory studies, ordering and review of radiographic studies, pulse oximetry and re-evaluation of patient's  condition.   Final Clinical Impressions(s) / ED Diagnoses   Final diagnoses:  Congestive heart failure, unspecified HF chronicity, unspecified heart failure type [facility](HCC)    New Prescriptions New Prescriptions   No medications on file     Rolland PorterJames, Humzah Harty, MD 10/27/16 1443

## 2016-10-27 NOTE — Telephone Encounter (Signed)
Referral made to [facility] attempted to call patient to inform her no answer unable to leave message

## 2016-10-28 ENCOUNTER — Ambulatory Visit: Payer: Medicare HMO

## 2016-10-28 LAB — BASIC METABOLIC PANEL
ANION GAP: 10 (ref 5–15)
BUN: 21 mg/dL — ABNORMAL HIGH (ref 6–20)
CHLORIDE: 102 mmol/L (ref 101–111)
CO2: 28 mmol/L (ref 22–32)
CREATININE: 1.44 mg/dL — AB (ref 0.44–1.00)
Calcium: 8.7 mg/dL — ABNORMAL LOW (ref 8.9–10.3)
GFR calc Af Amer: 40 mL/min — ABNORMAL LOW (ref >60)
GFR calc non Af Amer: 35 mL/min — ABNORMAL LOW (ref >60)
Glucose, Bld: 191 mg/dL — ABNORMAL HIGH (ref 65–99)
POTASSIUM: 4.1 mmol/L (ref 3.5–5.1)
SODIUM: 140 mmol/L (ref 135–145)

## 2016-10-28 LAB — MAGNESIUM: Magnesium: 1.9 mg/dL (ref 1.7–2.4)

## 2016-10-28 LAB — GLUCOSE, CAPILLARY
GLUCOSE-CAPILLARY: 164 mg/dL — AB (ref 65–99)
GLUCOSE-CAPILLARY: 218 mg/dL — AB (ref 65–99)
Glucose-Capillary: 201 mg/dL — ABNORMAL HIGH (ref 65–99)
Glucose-Capillary: 141 mg/dL — ABNORMAL HIGH (ref 65–99)

## 2016-10-28 MED ORDER — ISOSORBIDE DINITRATE 20 MG PO TABS
20.0000 mg | ORAL_TABLET | Freq: Three times a day (TID) | ORAL | Status: DC
  Administered 2016-10-28 (×3): 20 mg via ORAL
  Filled 2016-10-28 (×4): qty 1

## 2016-10-28 MED ORDER — IPRATROPIUM-ALBUTEROL 0.5-2.5 (3) MG/3ML IN SOLN
3.0000 mL | Freq: Three times a day (TID) | RESPIRATORY_TRACT | Status: DC
  Administered 2016-10-28 – 2016-10-30 (×6): 3 mL via RESPIRATORY_TRACT
  Filled 2016-10-28 (×6): qty 3

## 2016-10-28 MED ORDER — AMLODIPINE BESYLATE 10 MG PO TABS
10.0000 mg | ORAL_TABLET | Freq: Every day | ORAL | Status: DC
  Administered 2016-10-28 – 2016-11-01 (×5): 10 mg via ORAL
  Filled 2016-10-28 (×5): qty 1

## 2016-10-28 NOTE — Telephone Encounter (Signed)
Inpt

## 2016-10-28 NOTE — Progress Notes (Signed)
   Subjective: She feels much better this morning. She denies chest pain although feels like she is working harder than usual to take deep breaths. She is in no distress and has not required bipap since initial treatment in the ED. She denies worsening cough or sputum production. She is not having any dizziness or headache on a continuous nitro drip.  Objective:  Vital signs in last 24 hours: Vitals:   10/28/16 0527 10/28/16 0750 10/28/16 0800 10/28/16 1051  BP: (!) 163/63  (!) 165/68 139/64  Pulse:   62   Resp:   13   Temp:   98.8 F (37.1 C)   TempSrc:   Oral   SpO2:  98% 96%   Weight:      Height:       GENERAL- alert, co-operative, NAD HEENT- No accessory muscle recruitment CARDIAC- RRR, no murmurs, rubs or gallops RESP- Bibasilar inspiratory crackles, no significant wheezing EXTREMITIES- Minimal peripheral edema, appearing warm and well perfused SKIN- Warm, dry, well healing stasis ulcers on lower extremities PSYCH- Normal mood and affect   Assessment/Plan: Acute exacerbation of congestive heart failure (HCC) Hypertension Her sudden onset of pulmonary edema seems secondary to severely uncontrolled hypertension Clinically she is improving and maintaining O2 sats in high 90s on 4L O2 by [place]. Her SBP is in the 160s on a continuous nitro drip. Overnight she did not receive all doses of hydralazine due to a normal blood pressure reading, with subsequent rise in pressure afterwards. Urine output is good on high dose lasix, with no renal injury.  Continue lasix 80mg  IV TID and monitor response and renal function daily. Start isosorbide dinitrate PO, increase amlodipine, and continue q6hr hydralazine. Hopefully she can be controlled in 160s or better without the nitro drip by later today. Her work of breathing is acceptable on supplemental oxgen. Her current level of care is needed for frequent therapeutic drug monitoring and the potential for deterioration, but will hopefully improve by  later today.  CAD (coronary artery disease) No concerning EKG or telemetry changes, and she denies any chest pain. I do not see a need for additional ischemic evaluation or testing at this time.  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 Continue with a sensitive sliding scale and monitoring.  Dispo: Anticipated discharge in approximately 1-3 day(s).   Fuller Planhristopher W Valisa Karpel, MD PGY-II Internal Medicine Resident Pager# [phone] 10/28/2016, 10:53 AM

## 2016-10-28 NOTE — Progress Notes (Signed)
Subjective: Jamie Mason is feeling better this morning. She feels like she is breathing much better and slept well overnight. She denies any chest pain.  Objective: Vital signs in last 24 hours: Vitals:   10/28/16 0425 10/28/16 0527 10/28/16 0750 10/28/16 0800  BP: (!) 151/63 (!) 163/63  (!) 165/68  Pulse: (!) 57   62  Resp: 14   13  Temp: 98.4 F (36.9 C)   98.8 F (37.1 C)  TempSrc: Oral   Oral  SpO2: 98%  98% 96%  Weight: (!) 139 kg (306 lb 7 oz)     Height:       Weight change:  Down 1.5 lbs from yesterday evening  Intake/Output Summary (Last 24 hours) at 10/28/16 1038 Last data filed at 10/28/16 0600  Gross per 24 hour  Intake            376.3 ml  Output             1150 ml  Net           -773.7 ml   Physical Exam General: Obese female sitting up in bed. She is conversing comfortably on nasal cannula. HEENT: Moist mucus membranes.  Cardiac: Regular rate and rhythm. No murmurs, rubs, or gallops. Pulmonary: Normal work of breathing. Decreased breath sounds throughout. Slight basilar crackles heard bilaterally -- improved from yesterday. Abdominal: Soft, non-distended, non-tender. Extremities: Warm and dry. No peripheral edema. Healing venous ulcers over the medial malleoli bilaterally.  Lab Results: Basic Metabolic Panel:  Recent Labs Lab 10/27/16 1222 10/28/16 0316  NA 142 140  K 3.7 4.1  CL 108 102  CO2 24 28  GLUCOSE 228* 191*  BUN 17 21*  CREATININE 1.48* 1.44*  CALCIUM 9.0 8.7*  MG  --  1.9   Problem List: -- Acute on chronic heart failure with preserved ejection fraction -- Hypertension -- Diffuse coronary artery disease -- AKI -- T2DM -- Atrial flutter status post cardioversion -- Hyperlipidemia -- Obesity -- Chronic venous insufficiency   Assessment and Plan By Problem: #Acute on chronic heart failure with preserved ejection fraction #Hypertension Symptoms have improved significantly with blood pressure control and diuresis. We will  continue with this but titrate down the nitroglycerin drip; plan for blood pressure control while titrating down the nitroglycerin drip is continuing hydralazine, continuing carvedilol, beginning oral nitrates and increasing home amlodipine dose. -- Continue on nasal cannula for now, attempt to wean as possible -- Titrate down Nitro drip -- Continue hydralazine 50mg  every 6 hours -- Continue home carvedilol 25mg  twice daily -- Isosorbide dinitrate 20mg  3 times daily -- Continue home amlodipine at increased dose of 10mg  daily -- IV Lasix 80mg  3 times daily -- Duonebs -- Strict Is&Os; daily weights -- Closely monitor BMP + Mg and to ensure that K and Mg stay above 4 and 2 respectively; replete as needed  #Diffuse coronary artery disease History of NSTEMI secondary to demand ischemia during hospitalization from 07/06/16-07/16/16. Left heart catheterization at that time showed chronic total occlusion of mid RCA, moderate disease of LMCA, LAD, and left circumflex. Initial troponin today 0.01; EKG without ischemic changes. -- Continue home aspirin 81mg  daily -- Continue home carvedilol 25mg  twice daily -- Continue home atorvastatin 40mg  daily  #AKI Creatinine of 1.48 upon admission. Today 1.44. Baseline appears to be ~1.1-1.2. Will monitor closely and avoid nephrotoxic medications. -- Follow BMP  #T2DM Most recent Hgb A1c 7.3 in January 2018. On metformin 500mg  twice daily at home. Will hold for  now given that her creatinine is currently elevated from baseline and she is undergoing diuresis. -- Monitor CBGs q4 -- SSI  #Atrial flutter status post cardioversion Status post cardioversion on 07/07/16. EKG on admission shows sinus tachycardia. -- Telemetry -- Continue home amiodarone 200mg  daily -- Continue home Eliquis 5mg  twice daily  #Hyperlipidemia -- Continue home atorvastatin 40mg  daily  #Chronic venous insufficiency Chronic ulcers on medical malleoli appear to be healing. Lower  extremity edema has improved with diuresis. Will monitor and offer supportive care as needed.  FENGI: Heart Healthy DVT PPX: Home Eliquis Code: Full Dispo: Admitted to the Internal Medicine Teaching Service for further management. Anticipated discharge pending clinical improvement, likely 1-2 days.  This is a Psychologist, occupational Note.  The care of the patient was discussed with Dr. Dimple Casey and the assessment and plan formulated with their assistance.  Please see their attached note for official documentation of the daily encounter.   LOS: 1 day   Jamie Mason, Medical Student 10/28/2016, 10:38 AM

## 2016-10-28 NOTE — Progress Notes (Signed)
Internal Medicine Attending:   I saw and examined the patient. I reviewed the resident's note and I agree with the resident's findings and plan as documented in the resident's note.  Patient is much improved this morning. She has no chest pain and her shortness of breath is improving. Patient was admitted with an acute exacerbation of her diastolic heart failure likely secondary to hypertensive urgency in the setting of missing her morning medication doses. We will continue with IV Lasix 80 mg 3 times a day for today and likely transition her to oral Lasix in the morning. Patient is negative approximately 800 mL over the last 24 hours. We will taper her off the nitroglycerin drip and start oral isosorbide dinitrate and continue with hydralazine for now. Her oxygen requirements are decreasing and she is now satting 96% on 2 L nasal cannula. We will attempt to taper her off her oxygen. Once she has been tapered off her nitroglycerin drip we will transfer her out of the stepdown unit.

## 2016-10-29 LAB — GLUCOSE, CAPILLARY
GLUCOSE-CAPILLARY: 137 mg/dL — AB (ref 65–99)
GLUCOSE-CAPILLARY: 151 mg/dL — AB (ref 65–99)
Glucose-Capillary: 117 mg/dL — ABNORMAL HIGH (ref 65–99)
Glucose-Capillary: 112 mg/dL — ABNORMAL HIGH (ref 65–99)

## 2016-10-29 LAB — BASIC METABOLIC PANEL
ANION GAP: 9 (ref 5–15)
BUN: 35 mg/dL — AB (ref 6–20)
CO2: 27 mmol/L (ref 22–32)
Calcium: 8.5 mg/dL — ABNORMAL LOW (ref 8.9–10.3)
Chloride: 103 mmol/L (ref 101–111)
Creatinine, Ser: 1.68 mg/dL — ABNORMAL HIGH (ref 0.44–1.00)
GFR calc Af Amer: 33 mL/min — ABNORMAL LOW (ref >60)
GFR calc non Af Amer: 29 mL/min — ABNORMAL LOW (ref >60)
Glucose, Bld: 163 mg/dL — ABNORMAL HIGH (ref 65–99)
POTASSIUM: 3.3 mmol/L — AB (ref 3.5–5.1)
SODIUM: 139 mmol/L (ref 135–145)

## 2016-10-29 MED ORDER — POTASSIUM CHLORIDE CRYS ER 20 MEQ PO TBCR
40.0000 meq | EXTENDED_RELEASE_TABLET | Freq: Two times a day (BID) | ORAL | Status: DC
  Administered 2016-10-29 – 2016-10-31 (×5): 40 meq via ORAL
  Filled 2016-10-29 (×5): qty 2

## 2016-10-29 MED ORDER — DIPHENHYDRAMINE HCL 25 MG PO CAPS
25.0000 mg | ORAL_CAPSULE | Freq: Three times a day (TID) | ORAL | Status: DC | PRN
  Administered 2016-10-29: 25 mg via ORAL
  Filled 2016-10-29: qty 1

## 2016-10-29 MED ORDER — FUROSEMIDE 80 MG PO TABS
80.0000 mg | ORAL_TABLET | Freq: Two times a day (BID) | ORAL | Status: DC
  Administered 2016-10-30 (×2): 80 mg via ORAL
  Filled 2016-10-29 (×2): qty 1

## 2016-10-29 MED ORDER — SALINE SPRAY 0.65 % NA SOLN
1.0000 | NASAL | Status: DC | PRN
  Administered 2016-10-29: 1 via NASAL
  Filled 2016-10-29: qty 44

## 2016-10-29 MED ORDER — FUROSEMIDE 10 MG/ML IJ SOLN
80.0000 mg | INTRAMUSCULAR | Status: DC
  Administered 2016-10-29 (×2): 80 mg via INTRAVENOUS
  Filled 2016-10-29 (×2): qty 8

## 2016-10-29 MED ORDER — MAGNESIUM HYDROXIDE 400 MG/5ML PO SUSP
30.0000 mL | Freq: Every day | ORAL | Status: DC | PRN
  Administered 2016-10-29 – 2016-10-31 (×2): 30 mL via ORAL
  Filled 2016-10-29 (×2): qty 30

## 2016-10-29 MED ORDER — SENNOSIDES-DOCUSATE SODIUM 8.6-50 MG PO TABS
2.0000 | ORAL_TABLET | Freq: Every day | ORAL | Status: DC | PRN
  Administered 2016-10-29 – 2016-10-31 (×2): 2 via ORAL
  Filled 2016-10-29 (×2): qty 2

## 2016-10-29 NOTE — Progress Notes (Signed)
Pt transferred to 3 East bed 11 per recliner by RN with all belongings

## 2016-10-29 NOTE — Progress Notes (Signed)
Called report for 3 East bed 11 spoke to Owens [employer] MinorLauren RN aware that pt is coming soon.

## 2016-10-29 NOTE — Progress Notes (Signed)
Medicine attending: Clinical status reviewed with resident physician Dr. Sheliah Hatchhristopher Rice and I concur with his evaluation and management plan.  76 year old woman admitted with a hypertensive crisis resulting in flash pulmonary edema initially stabilized on a parenteral nitroglycerin infusion and parenteral diuretics.  Nitroglycerin now discontinued.  We are reinstituting her oral regimen.

## 2016-10-29 NOTE — Progress Notes (Signed)
   Subjective: She is drowsy this morning otherwise in no distress. She received a dose of benadryl overnight for itching. Her breathing is easier with reduced supplemental oxygen. She has been out of bed multiple times without difficulty. Her urine output has been frequent small amounts throughout the day. She denies coughing, fevers, r chest pain.  Objective:  Vital signs in last 24 hours: Vitals:   10/29/16 0539 10/29/16 0700 10/29/16 0800 10/29/16 0834  BP: 128/63 (!) 132/56 (!) 115/57   Pulse:  (!) 54 64   Resp:  13 19   Temp:      TempSrc:      SpO2:    100%  Weight:      Height:       GENERAL- Sleepy but arousable to voice. CARDIAC- RRR, no murmurs, rubs or gallops RESP- Bibasilar inspiratory crackles, no significant wheezing EXTREMITIES- Trace pitting pedal edema   Assessment/Plan: Acute exacerbation of congestive heart failure (HCC) Hypertension Blood pressures are now well controlled off all intravenous medication this morning. Increased meds and decreasing volume are contributing. Potassium down today with diuresis and will be replaced with PO KCl. We will continue increased home medication and oral hydralazine at this time. We can stop oral nitrates today unless blood pressure increases again. She remains hypervolemic by exam but SCr is slightly up today, so I will decrease diuretics to BID and possibly back to home dose tomorrow. She is stable for transfer out of the stepdown unit today. Ambulation today and we can try to wean her off oxygen as tolerated.  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  Dispo: Anticipated discharge in approximately 1-2 day(s).   Fuller Planhristopher W Demon Volante, MD PGY-II Internal Medicine Resident Pager# [phone] 10/29/2016, 10:06 AM

## 2016-10-30 DIAGNOSIS — E1122 Type 2 diabetes mellitus with diabetic chronic kidney disease: Secondary | ICD-10-CM

## 2016-10-30 DIAGNOSIS — I509 Heart failure, unspecified: Secondary | ICD-10-CM

## 2016-10-30 DIAGNOSIS — Z7984 Long term (current) use of oral hypoglycemic drugs: Secondary | ICD-10-CM

## 2016-10-30 DIAGNOSIS — I130 Hypertensive heart and chronic kidney disease with heart failure and stage 1 through stage 4 chronic kidney disease, or unspecified chronic kidney disease: Principal | ICD-10-CM

## 2016-10-30 DIAGNOSIS — N182 Chronic kidney disease, stage 2 (mild): Secondary | ICD-10-CM

## 2016-10-30 LAB — GLUCOSE, CAPILLARY
GLUCOSE-CAPILLARY: 114 mg/dL — AB (ref 65–99)
GLUCOSE-CAPILLARY: 116 mg/dL — AB (ref 65–99)
GLUCOSE-CAPILLARY: 173 mg/dL — AB (ref 65–99)
Glucose-Capillary: 159 mg/dL — ABNORMAL HIGH (ref 65–99)

## 2016-10-30 LAB — BASIC METABOLIC PANEL
Anion gap: 12 (ref 5–15)
BUN: 39 mg/dL — AB (ref 6–20)
CHLORIDE: 102 mmol/L (ref 101–111)
CO2: 26 mmol/L (ref 22–32)
Calcium: 9 mg/dL (ref 8.9–10.3)
Creatinine, Ser: 1.55 mg/dL — ABNORMAL HIGH (ref 0.44–1.00)
GFR calc Af Amer: 37 mL/min — ABNORMAL LOW (ref >60)
GFR calc non Af Amer: 32 mL/min — ABNORMAL LOW (ref >60)
Glucose, Bld: 144 mg/dL — ABNORMAL HIGH (ref 65–99)
POTASSIUM: 4.6 mmol/L (ref 3.5–5.1)
SODIUM: 140 mmol/L (ref 135–145)

## 2016-10-30 MED ORDER — IPRATROPIUM-ALBUTEROL 0.5-2.5 (3) MG/3ML IN SOLN: 3.0000 mL | RESPIRATORY_TRACT | Status: DC | PRN

## 2016-10-30 NOTE — Progress Notes (Signed)
Pt sitting up in chair, speaking in full sentences. Would like to ambulate later today.

## 2016-10-30 NOTE — Progress Notes (Signed)
   Subjective: Sitting up in bedside chair without any difficulty today. She is on room air at rest. She still feels anxious with some dyspnea with exertion.  Objective:  Vital signs in last 24 hours: Vitals:   10/29/16 2030 10/30/16 0601 10/30/16 0919 10/30/16 1004  BP: (!) 127/48 (!) 117/56  (!) 149/54  Pulse: 60 (!) 56  66  Resp: 18 18  18   Temp: 97.6 F (36.4 C) 97.8 F (36.6 C)    TempSrc: Oral Oral    SpO2: 100% 100% 100% 99%  Weight:  (!) 315 lb 8 oz (143.1 kg)    Height:       GENERAL- Alert, sitting in bedside chair in no acute distress CARDIAC- RRR, no murmurs, rubs or gallops RESP- CTAB, normal WOB EXTREMITIES- Trace pedal edema bilaterally  Assessment/Plan: #Acute exacerbation of congestive heart failure (HCC) #Hypertension Blood pressure is very well controlled today off ntirates. Weight is down to 315 lbs. She remains on increased amlodipine and hydralazine here. She has dyspnea on exertion unclear if this is related to hypoxia or her underlying deconditioning and heart failure. She is back to oral lasix today with slight improvement in SCr. We will check ambulatory pulse oximetry today. She will need to be discharged on hydralazine as a new medicine considering her severe HTN with respiratory failure on arrival, to be adjusted outpatient.  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 She is not in acute renal failure and can resume oral medications at discharge.  Dispo: Anticipated discharge in approximately 1 day(s).   Fuller Planhristopher W Jocelynne Duquette, MD PGY-II Internal Medicine Resident Pager# [phone] 10/30/2016, 12:36 PM

## 2016-10-30 NOTE — Progress Notes (Signed)
Pt refusing to walk will continue to check in with pt.

## 2016-10-30 NOTE — Progress Notes (Signed)
Subjective: Jamie Mason is feeling well overall this morning, but she does complain of some wheezing and shortness of breath. She is quite anxious about why she is continuing to have shortness of breath.  Objective: Vital signs in last 24 hours: Vitals:   10/29/16 2030 10/30/16 0601 10/30/16 0919 10/30/16 1004  BP: (!) 127/48 (!) 117/56  (!) 149/54  Pulse: 60 (!) 56  66  Resp: 18 18  18   Temp: 97.6 F (36.4 C) 97.8 F (36.6 C)    TempSrc: Oral Oral    SpO2: 100% 100% 100% 99%  Weight:  (!) 143.1 kg (315 lb 8 oz)    Height:       Physical Exam General: Obese, anxious female sitting up in bed. She is conversing comfortably on room air. HEENT: Moist mucus membranes. Cardiac: Regular rate and rhythm. No murmurs, rubs, or gallops. Pulmonary: Normal work of breathing. Decreased breath sounds throughout. Lungs clear to auscultation bilaterally. Abdominal: Soft, non-distended, non-tender. Extremities: Warm and dry. No peripheral edema. Healing venous ulcers over the medial malleoli bilaterally.  Lab Results: Basic Metabolic Panel:  Recent Labs Lab 10/28/16 0316 10/29/16 0332 10/30/16 0356  NA 140 139 140  K 4.1 3.3* 4.6  CL 102 103 102  CO2 28 27 26   GLUCOSE 191* 163* 144*  BUN 21* 35* 39*  CREATININE 1.44* 1.68* 1.55*  CALCIUM 8.7* 8.5* 9.0  MG 1.9  --   --    Problem List: -- Acute on chronic heart failure with preserved ejection fraction -- Hypertension -- Diffuse coronary artery disease -- AKI -- T2DM -- Atrial flutter status post cardioversion -- Hyperlipidemia -- Obesity -- Chronic venous insufficiency   Assessment and Plan By Problem: #Acute on chronic heart failure with preserved ejection fraction #Hypertension Blood pressures are well-controlled on oral medications (hydralazine, carvedilol, and amlodipine). Symptoms have improved significantly with blood pressure control and diuresis. She is breathing well on room air. She is now on her home dose of oral  lasix, 80mg  BID.  -- Continue hydralazine 50mg  every 6 hours -- Continue home carvedilol 25mg  twice daily -- Continue home amlodipine at increased (from home) dose of 10mg  daily -- PO Lasix 80mg  2 times daily -- Duonebs -- Strict Is&Os; daily weights -- Closely monitor BMP + Mg and to ensure that K and Mg stay above 4 and 2 respectively; replete as needed -- Check O2 sats with ambulation  #Diffuse coronary artery disease History of NSTEMI secondary to demand ischemia during hospitalization from 07/06/16-07/16/16. Left heart catheterization at that time showed chronic total occlusion of mid RCA, moderate disease of LMCA, LAD, and left circumflex. -- Continue home aspirin 81mg  daily -- Continue home carvedilol 25mg  twice daily -- Continue home atorvastatin 40mg  daily  #AKI Creatinine of 1.48 upon admission. Yesterday 1.68, today 1.55. Baseline appears to be ~1.1-1.2. Will monitor closely and avoid nephrotoxic medications. She has been transitioned from IV to PO lasix. -- Follow BMP  #T2DM Most recent Hgb A1c 7.3 in January 2018. On metformin 500mg  twice daily at home. Will hold for now given that her creatinine is currently elevated from baseline and she is undergoing diuresis. -- Monitor CBGs q4 -- SSI  #Atrial flutter status post cardioversion Status post cardioversion on 07/07/16. Has been in sinus rhythm since admission; will discontinue telemetry. -- Continue home amiodarone 200mg  daily -- Continue home Eliquis 5mg  twice daily  #Hyperlipidemia -- Continue home atorvastatin 40mg  daily  #Chronic venous insufficiency Chronic ulcers on medical malleoli appear to be  healing. Lower extremity edema has improved with diuresis. Will monitor and offer supportive care as needed.  FENGI: Heart Healthy DVT PPX: Home Eliquis Code: Full Dispo: Admitted to the Internal Medicine Teaching Service for further management. Anticipated discharge pending clinical improvement, likely 1-2  days.  This is a Psychologist, occupationalMedical Student Note.  The care of the patient was discussed with Dr. Dimple Caseyice and the assessment and plan formulated with their assistance.  Please see their attached note for official documentation of the daily encounter.   LOS: 3 days   Jamie Mason, Jamie Mason, Medical Student 10/30/2016, 11:36 AM

## 2016-10-30 NOTE — Progress Notes (Signed)
Medicine attending: I personally examined this patient today together with resident physician Dr. Sheliah Hatchhristopher Rice and I concur with his evaluation and management plan which we discussed together.  She is making steady progress.  Her lungs are clear.  No JVD.  Regular cardiac rhythm.  No S3 gallop.  Decreased peripheral edema.  She remains quite anxious. Current blood pressure acceptable at 149/54.  Weight 315.5.  Admission weight 324.  We will transition to oral diuretics today and assess oxygenation on ambulation.  Begin discharge planning.  Anticipate discharge tomorrow.

## 2016-10-30 NOTE — Progress Notes (Signed)
Went into walk with patient pt c/o of hip pain and didn't want to walk tylenol given. Reassessed with patient.

## 2016-10-31 LAB — GLUCOSE, CAPILLARY
GLUCOSE-CAPILLARY: 151 mg/dL — AB (ref 65–99)
GLUCOSE-CAPILLARY: 112 mg/dL — AB (ref 65–99)
GLUCOSE-CAPILLARY: 144 mg/dL — AB (ref 65–99)
Glucose-Capillary: 168 mg/dL — ABNORMAL HIGH (ref 65–99)

## 2016-10-31 LAB — BASIC METABOLIC PANEL
Anion gap: 7 (ref 5–15)
BUN: 42 mg/dL — AB (ref 6–20)
CHLORIDE: 103 mmol/L (ref 101–111)
CO2: 28 mmol/L (ref 22–32)
Calcium: 8.9 mg/dL (ref 8.9–10.3)
Creatinine, Ser: 1.62 mg/dL — ABNORMAL HIGH (ref 0.44–1.00)
GFR calc Af Amer: 35 mL/min — ABNORMAL LOW (ref >60)
GFR calc non Af Amer: 30 mL/min — ABNORMAL LOW (ref >60)
Glucose, Bld: 150 mg/dL — ABNORMAL HIGH (ref 65–99)
POTASSIUM: 4.4 mmol/L (ref 3.5–5.1)
SODIUM: 138 mmol/L (ref 135–145)

## 2016-10-31 MED ORDER — IPRATROPIUM-ALBUTEROL 0.5-2.5 (3) MG/3ML IN SOLN
3.0000 mL | Freq: Once | RESPIRATORY_TRACT | Status: AC
  Administered 2016-10-31: 3 mL via RESPIRATORY_TRACT
  Filled 2016-10-31: qty 3

## 2016-10-31 MED ORDER — HYDRALAZINE HCL 50 MG PO TABS
50.0000 mg | ORAL_TABLET | Freq: Three times a day (TID) | ORAL | Status: DC
  Administered 2016-10-31 – 2016-11-01 (×4): 50 mg via ORAL
  Filled 2016-10-31 (×4): qty 1

## 2016-10-31 MED ORDER — FUROSEMIDE 80 MG PO TABS
80.0000 mg | ORAL_TABLET | ORAL | Status: DC
  Administered 2016-10-31 – 2016-11-01 (×2): 80 mg via ORAL
  Filled 2016-10-31 (×2): qty 1

## 2016-10-31 NOTE — Progress Notes (Signed)
Subjective: [name] is feeling well this morning. She denies shortness of breath or chest pain currently. She is feeling very anxious about the possibility oFredric Mason feeling short of breath again when she goes home. She ambulated a little yesterday but this was limited by fatigue and hip pain. Her oxygen saturation was not checked with ambulation yesterday. She is willing to try ambulating again today.  Objective: Vital signs in last 24 hours: Vitals:   10/30/16 2028 10/31/16 0514 10/31/16 0911 10/31/16 1243  BP: 106/82 (!) 122/55  (!) 154/53  Pulse: (!) 56 67  67  Resp: 20 20  20   Temp: 98.3 F (36.8 C) 98.2 F (36.8 C)  98.3 F (36.8 C)  TempSrc: Oral Oral  Oral  SpO2: 99% 98% 100% 98%  Weight:  (!) 143.8 kg (317 lb 1.6 oz)    Height:       General: Obese, anxious female sitting up in bed. She is conversing comfortably on room air. HEENT: Moist mucus membranes. Cardiac: Regular rate and rhythm. No murmurs, rubs, or gallops. Pulmonary: Normal work of breathing. Decreased breath sounds throughout. Lungs clear to auscultation bilaterally. Abdominal: Soft, non-distended, non-tender. Extremities: Warm and dry. No peripheral edema.Healing venous ulcers over the medial malleoli bilaterally.  Lab Results: Basic Metabolic Panel:  Recent Labs Lab 10/28/16 0316  10/30/16 0356 10/31/16 0415  NA 140  < > 140 138  K 4.1  < > 4.6 4.4  CL 102  < > 102 103  CO2 28  < > 26 28  GLUCOSE 191*  < > 144* 150*  BUN 21*  < > 39* 42*  CREATININE 1.44*  < > 1.55* 1.62*  CALCIUM 8.7*  < > 9.0 8.9  MG 1.9  --   --   --   < > = values in this interval not displayed.  Problem List: -- Acute on chronic heart failure with preserved ejection fraction -- Hypertension -- Diffuse coronary artery disease -- AKI -- T2DM -- Atrial flutter status post cardioversion -- Hyperlipidemia -- Obesity -- Chronic venous insufficiency   Assessment and Plan By Problem: #Acute on chronic heart failure with  preserved ejection fraction #Hypertension Blood pressures are well-controlled on oral medications (hydralazine, carvedilol, and amlodipine). Symptoms have improved significantly with blood pressure control and diuresis. She is breathing well on room air. She is now on her home dose of oral lasix, 80mg  BID. Her creatinine is 1.62, up from 1.48 upon admission. Her baseline is ~1.2. Given this, we will decrease her daily dose of lasix upon discharge. Her creatinine can be rechecked and this dose can be titrated as an outpatient. -- Continue hydralazine 50mg  every 6 hours -- Continue home carvedilol 25mg  twice daily -- Continue home amlodipine at increased (from home) dose of 10mg  daily -- PO Lasix 80mg  every morning, 40mg  every evening -- Check O2 sats with ambulation  #Diffuse coronary artery disease History of NSTEMI secondary to demand ischemia during hospitalization from 07/06/16-07/16/16. Left heart catheterization at that time showed chronic total occlusion of mid RCA, moderate disease of LMCA, LAD, and left circumflex. -- Continue home aspirin 81mg  daily -- Continue home carvedilol 25mg  twice daily -- Continue home atorvastatin 40mg  daily  #AKI Creatinine of 1.48 upon admission. Yesterday 1.62 today. Baseline appears to be ~1.2. We will decrease her daily dose of lasix to 80mg  every morning, 40mg  every evening upon discharge. Her creatinine can be rechecked and this dose can be titrated as an outpatient.  #T2DM Most recent Hgb  A1c 7.3 in January 2018. On metformin 500mg  twice daily at home. Can resume upon discharge.  #Atrial flutter status post cardioversion Status post cardioversion on 07/07/16. Has been in sinus rhythm since admission; will discontinue telemetry. -- Continue home amiodarone 200mg  daily -- Continue home Eliquis 5mg  twice daily  #Hyperlipidemia -- Continue home atorvastatin 40mg  daily  #Chronic venous insufficiency Chronic ulcers on medical malleoli appear to be  healing. Lower extremity edema has improvedwith diuresis. Will monitor and offer supportive care as needed.  FENGI: Heart Healthy DVT PPX: Home Eliquis Code: Full Dispo: Stable for discharge pending successful ambulation this afternoon.  This is a Psychologist, occupational Note.  The care of the patient was discussed with Dr. Dimple Casey and the assessment and plan formulated with their assistance.  Please see their attached note for official documentation of the daily encounter.   LOS: 4 days   Raoul Pitch, Medical Student 10/31/2016, 2:25 PM

## 2016-10-31 NOTE — Progress Notes (Signed)
Internal Medicine Attending:   I saw and examined the patient. I reviewed the resident's note and I agree with the resident's findings and plan as documented in the resident's note.  Patient feels well today with no new complaints. Her shortness of breath has resolved and she is on room air now. Patient was admitted with an acute heart failure exacerbation possibly secondary to hypertensive urgency. Her blood pressure is now much better controlled and her dyspnea has resolved. We will transition her to oral Lasix today. We'll continue with her increased dose of amlodipine and continue with hydralazine as an outpatient for better blood pressure control. Patient will likely be discharged home today.

## 2016-10-31 NOTE — Progress Notes (Signed)
MD wanting pt to ambulate.  Mobility tech helped ambulate the pt, pt with wheezing and is SOB.  Pt desaturated to 84% on RA.  Pt back in bed, MD made aware.

## 2016-10-31 NOTE — Consult Note (Addendum)
   [facility] CM Inpatient Consult   10/31/2016  [name] 09/18/1940 [id number]   [facility] for chronic disease management services.  Patient has been engaged by a [employer] Community Care Coordinator. Chart review reveals the patient was admitted with Hypoxia, Diabetes,  and HF exacerbation.   Our community based plan of care has focused on disease management and community resource support.  Patient will receive a post discharge transition of care call and will be evaluated for monthly home visits for assessments and disease process education. Came by to see patient and daughter, Jamie Mason, at bedside states, "she just went to sleep."  Will continue to follow for post hospital disposition and community care management needs,.  Notified [facility] of patient's hospitalization.  Noted questions regarding home health care follow up.  Of note, [facility] services does not replace or interfere with any services that are needed or arranged by inpatient case management or social work.  For additional questions or referrals please contact:   Charlesetta ShanksVictoria Malaya Cagley, RN BSN CCM [facility] Liaison  [phone] business mobile phone Toll free office [phone]   1450:  Addendum - Inpatient RNCM Lelon MastSamantha in to speak with the patient, still sleeping,  regarding [facility]. Spoke with daughter Jamie Mason and confirms the agency that was starting [facility].

## 2016-10-31 NOTE — Progress Notes (Signed)
PT Cancellation Note  Patient Details Name: Arvella NighGloria C Haislip MRN: [id number] DOB: 11/01/1940   Cancelled Treatment:    Reason Eval/Treat Not Completed: Other (comment). PT arrived to room and pt just finished amb with mobility tech, [place]. Pt very SOB and desaturated to 84% on RA s/p 10 feet of ambulation. Spoke with Lauretta GrillSam Claxton, case manager and PT to see patient in AM for d/c recommendations.    Esterlene Atiyeh M Deone Leifheit 10/31/2016, 4:01 PM   Lewis ShockAshly Tuwanda Vokes, PT, DPT Pager #: [phone] Office #: [phone]

## 2016-10-31 NOTE — Care Management Note (Addendum)
Case Management Note  Patient Details  Name: Jamie Mason MRN: [id number] Date of Birth: 08/01/1940  Subjective/Objective:    Pt admitted with exacerbation of CHF                Action/Plan:   PTA from home with husband and adult children.  On previsous admit pt was recommended for SNF  - however refuses , uses walker in the home.  [facility]  CM will request resumption orders if pt discharges home - PT eval ordered.  Pt performs daily weights and "tries to eat less salt than normal".   Expected Discharge Date:                  Expected Discharge Plan:  Home w Home Health Services  In-House Referral:     Discharge planning Services  CM Consult  Post Acute Care Choice:    Choice offered to:     DME Arranged:    DME Agency:     HH Arranged:    HH Agency:     Status of Service:     If discussed at [employer] Meetings, dates discussed:    Additional CommentsRolene Arbour: [facility] made aware of admit Cherylann ParrClaxton, Magenta Schmiesing S, RN 10/31/2016, 3:01 PM

## 2016-10-31 NOTE — Progress Notes (Signed)
   Subjective: Evaluated while lying in bed in no distress this morning. She expressed a lot of anxiety about recurrent dyspnea after leaving the hospital. She was comfortable without trouble lying flat and has walked around her room, but not ambulated with pulse oximetry yet.  Objective:  Vital signs in last 24 hours: Vitals:   10/30/16 2028 10/31/16 0514 10/31/16 0911 10/31/16 1243  BP: 106/82 (!) 122/55  (!) 154/53  Pulse: (!) 56 67  67  Resp: 20 20  20   Temp: 98.3 F (36.8 C) 98.2 F (36.8 C)  98.3 F (36.8 C)  TempSrc: Oral Oral  Oral  SpO2: 99% 98% 100% 98%  Weight:  (!) 317 lb 1.6 oz (143.8 kg)    Height:       GENERAL- Alert, sitting in bed in no acute distress CARDIAC- RRR, no murmurs, rubs or gallops RESP- CTAB, normal WOB EXTREMITIES- Chronic lower extremity skin changes, no pitting peripheral edema  Assessment/Plan: #Acute exacerbation of congestive heart failure #Hypertension Blood pressure is very tightly controlled today. SCr remains slightly elevated so we can hold off on any more diuresis and reassess fluid status at clinic follow up. She has a lot of anxiety related to this dyspnea which is reasonable consider the acuity of her admission. She seems to be near euvolemic status on exam. Her exertion tolerance is likely poor from multiple factors including her deconditioning and body habitus. She is switched to PO lasix only today. We are checking for oxygen saturation with ambulation today. She seems to be doing poorly and not suitable for discharge today. With significant hypoxia we may need physical therapy assistance for her deconditioning.  Type 2 diabetes mellitus with stage 2 chronic kidney disease CBGs have remained well controlled on minimal insulin treatment. She will resume oral diabetes medications at discharge.  Dispo: Anticipated discharge to home in 1-2 days.  Fuller Planhristopher W Maui Britten, MD PGY-II Internal Medicine Resident Pager# [phone] 10/31/2016,  3:47 PM

## 2016-11-01 ENCOUNTER — Telehealth: Payer: Self-pay

## 2016-11-01 LAB — BASIC METABOLIC PANEL
ANION GAP: 8 (ref 5–15)
BUN: 41 mg/dL — ABNORMAL HIGH (ref 6–20)
CO2: 29 mmol/L (ref 22–32)
Calcium: 9.1 mg/dL (ref 8.9–10.3)
Chloride: 101 mmol/L (ref 101–111)
Creatinine, Ser: 1.72 mg/dL — ABNORMAL HIGH (ref 0.44–1.00)
GFR, EST AFRICAN AMERICAN: 33 mL/min — AB (ref >60)
GFR, EST NON AFRICAN AMERICAN: 28 mL/min — AB (ref >60)
GLUCOSE: 138 mg/dL — AB (ref 65–99)
POTASSIUM: 4.7 mmol/L (ref 3.5–5.1)
Sodium: 138 mmol/L (ref 135–145)

## 2016-11-01 LAB — GLUCOSE, CAPILLARY
GLUCOSE-CAPILLARY: 145 mg/dL — AB (ref 65–99)
Glucose-Capillary: 132 mg/dL — ABNORMAL HIGH (ref 65–99)

## 2016-11-01 MED ORDER — AMIODARONE HCL 200 MG PO TABS: 200.0000 mg | ORAL_TABLET | Freq: Every day | ORAL | 2 refills | Status: DC

## 2016-11-01 MED ORDER — POLYETHYLENE GLYCOL 3350 17 G PO PACK
17.0000 g | PACK | Freq: Once | ORAL | Status: AC
  Administered 2016-11-01: 17 g via ORAL
  Filled 2016-11-01: qty 1

## 2016-11-01 MED ORDER — FUROSEMIDE 40 MG PO TABS: ORAL_TABLET | ORAL | 2 refills | Status: DC

## 2016-11-01 MED ORDER — SENNA 8.6 MG PO TABS
2.0000 | ORAL_TABLET | Freq: Once | ORAL | Status: AC
  Administered 2016-11-01: 17.2 mg via ORAL
  Filled 2016-11-01: qty 2

## 2016-11-01 MED ORDER — AMLODIPINE BESYLATE 10 MG PO TABS: 10.0000 mg | ORAL_TABLET | Freq: Every day | ORAL | 2 refills | Status: DC

## 2016-11-01 MED ORDER — HYDRALAZINE HCL 50 MG PO TABS: 50.0000 mg | ORAL_TABLET | Freq: Three times a day (TID) | ORAL | 2 refills | Status: DC

## 2016-11-01 NOTE — Progress Notes (Signed)
Internal Medicine Attending:   I saw and examined the patient. I reviewed the resident's note and I agree with the resident's findings and plan as documented in the resident's note.  Patient feels well this morning with no new complaints. She did develop some shortness of breath all attending to walk yesterday and had her O2 saturation dropped to to the 80s while ambulating. Patient was admitted with an acute exacerbation of her chronic diastolic heart failure in the setting of hypertensive urgency. She has diuresed well and her blood pressure is now well-controlled. She walked with PT this morning and her O2 stats remained above 94%. We'll hold her Lasix today given her elevated creatinine likely secondary to overdiuresis. She'll need a BMP on outpatient follow-up. We'll resume Lasix in the morning. Patient stable for discharge home today on new antihypertensive regimen. No further workup for now.

## 2016-11-01 NOTE — Telephone Encounter (Signed)
HOSPITAL TOC PER DR RICE, DISCHARGE 11/01/2016, APPT 11/08/2016 @2 :45

## 2016-11-01 NOTE — Progress Notes (Signed)
   Subjective: She is sitting at bedside eating breakfast without difficulty this morning. She became very short of breath with walking yesterday. She reports that her activity at home is very minimal usually just going about 30 feet from bedroom to bathroom and rarely leaves the house. She denies much cough or dyspnea at rest.  Objective:  Vital signs in last 24 hours: Vitals:   10/31/16 1717 10/31/16 2100 11/01/16 0505 11/01/16 1119  BP: (!) 125/50 124/72 124/73 125/67  Pulse: 64 64 64 60  Resp:  18 20   Temp:  98.7 F (37.1 C) 98.9 F (37.2 C) 98.6 F (37 C)  TempSrc:  Oral Oral Oral  SpO2: 97% 99% 100% 100%  Weight:   (!) 317 lb 12.8 oz (144.2 kg)   Height:       GENERAL- Alert, cooperative, in no acute distress CARDIAC- RRR, no murmurs, rubs or gallops RESP- CTAB, normal WOB on room air EXTREMITIES- Chronic lower extremity skin changes, trace edema  Assessment/Plan: #Acute exacerbation of congestive heart failure #Hypertension Her blood pressure remains at goal or mildly elevated today. SCr increased with a reduction of her oral lasix so we will hold further doses today. Her hypoxia with exertion appears to be from deconditioning and body habitus more than pulmonary disease or continuing hypervolemia at this time.  She walked with PT this morning and her hypoxia improved, staying above 94% today. She has trace edema of the lower extremities that appears to be very chronic. She should be able to return home on increased oral antihypertensives with clinic follow up to titrate.  #Type 2 diabetes mellitus with stage 2 chronic kidney disease CBGs are well controlled on minimal insulin treatment. She will resume oral diabetes medications at discharge.    Dispo: Anticipated discharge to home later today  Fuller Planhristopher W Peterson Mathey, MD PGY-II Internal Medicine Resident Pager# [phone] 11/01/2016, 3:47 PM

## 2016-11-01 NOTE — Evaluation (Signed)
Physical Therapy Evaluation Patient Details Name: Jamie Mason MRN: [id number] DOB: 12/05/1940 Today's Date: 11/01/2016   History of Present Illness  patient is a 76 y/o female with PMH of chronic diastolic HF (grade 2), CAD, DM, aflutter s/p DCCV, HTN, HLD who presents with sudden onset SOB * 1 day.  Clinical Impression   Pt admitted with above diagnosis. Pt currently with functional limitations due to the deficits listed below (see PT Problem List). Presents with general deconditioning, fatigue with activity; we discussed the benefits of gently increasing activity -- recommend HHPT follow up for HEP, and to encourage gently increasing activity; Ambulated on room air and O2 sats remained greater than or equal to 94%;  Pt will benefit from skilled PT to increase their independence and safety with mobility to allow discharge to the venue listed below.       Follow Up Recommendations Home health PT    Equipment Recommendations  None recommended by PT    Recommendations for Other Services       Precautions / Restrictions Precautions Precaution Comments: Fatigue with amb; needs multiple standing rest breaks      Mobility  Bed Mobility Overal bed mobility: Needs Assistance Bed Mobility: Supine to Sit     Supine to sit: Supervision     General bed mobility comments: Cues to self-monitor for activity tolerance  Transfers Overall transfer level: Needs assistance Equipment used: Rolling walker (2 wheeled) Transfers: Sit to/from Stand Sit to Stand: Min guard         General transfer comment: Minguard for safety  Ambulation/Gait Ambulation/Gait assistance: Min guard Ambulation Distance (Feet): 80 Feet (multiple standing rest breaks) Assistive device: Rolling walker (2 wheeled) Gait Pattern/deviations: Step-through pattern;Decreased step length - right;Decreased step length - left;Decreased stride length Gait velocity: slow Gait velocity interpretation: Below normal speed  for age/gender General Gait Details: Requiring multiple standing rest breaks where she rests forearms on RW; Cues to self-monitor for activity tolerance; ambulated on room air and O2 sats remained greater tahn or equal to 94%  Stairs            Wheelchair Mobility    Modified Rankin (Stroke Patients Only)       Balance Overall balance assessment: Needs assistance   Sitting balance-Leahy Scale: Good       Standing balance-Leahy Scale: Fair Standing balance comment: abel to stand statically without UE support; noting tendency to reach for bil UE support with dynamic tasks/walking, so opted for RW with amb today                             Pertinent Vitals/Pain Pain Assessment: No/denies pain (concerned about moving bowels)    Home Living Family/patient expects to be discharged to:: Private residence Living Arrangements: Children;Spouse/significant other Available Help at Discharge: Family;Available 24 hours/day Type of Home: House Home Access: Stairs to enter Entrance Stairs-Rails: Right Entrance Stairs-Number of Steps: 3 Home Layout: One level Home Equipment: Walker - 2 wheels;Cane - quad Additional Comments: husband is blind, 1 dtr works, other dtr stays at home during the day    Prior Function Level of Independence: Independent with assistive device(s)         Comments: walks with cane, sleeps in recliner, sponge bathes     Hand Dominance   Dominant Hand: Right    Extremity/Trunk Assessment   Upper Extremity Assessment Upper Extremity Assessment: Generalized weakness    Lower Extremity Assessment Lower Extremity Assessment:  Generalized weakness       Communication   Communication: No difficulties  Cognition Arousal/Alertness: Awake/alert Behavior During Therapy: WFL for tasks assessed/performed Overall Cognitive Status: Within Functional Limits for tasks assessed                                        General  Comments      Exercises     Assessment/Plan    PT Assessment Patient needs continued PT services  PT Problem List Decreased strength;Decreased activity tolerance;Decreased balance;Decreased mobility;Decreased knowledge of use of DME;Decreased knowledge of precautions;Cardiopulmonary status limiting activity       PT Treatment Interventions DME instruction;Gait training;Stair training;Functional mobility training;Therapeutic activities;Therapeutic exercise;Balance training;Patient/family education    PT Goals (Current goals can be found in the Care Plan section)  Acute Rehab PT Goals Patient Stated Goal: hopes to get home soon PT Goal Formulation: With patient Time For Goal Achievement: 11/15/16 Potential to Achieve Goals: Good    Frequency Min 3X/week   Barriers to discharge        Co-evaluation               AM-PAC PT "6 Clicks" Daily Activity  Outcome Measure Difficulty turning over in bed (including adjusting bedclothes, sheets and blankets)?: A Little Difficulty moving from lying on back to sitting on the side of the bed? : A Little Difficulty sitting down on and standing up from a chair with arms (e.g., wheelchair, bedside commode, etc,.)?: A Little Help needed moving to and from a bed to chair (including a wheelchair)?: None Help needed walking in hospital room?: A Little Help needed climbing 3-5 steps with a railing? : A Lot 6 Click Score: 18    End of Session Equipment Utilized During Treatment: Gait belt Activity Tolerance: Patient tolerated treatment well;Other (comment) (Fatigued post walk; 3-5 minutes with seated rest to catch breath) Patient left: in chair;with call bell/phone within reach Nurse Communication: Mobility status PT Visit Diagnosis: Unsteadiness on feet (R26.81);Difficulty in walking, not elsewhere classified (R26.2);Other (comment) (decr functional capacity)    Time: 1610-9604 PT Time Calculation (min) (ACUTE ONLY): 23 min   Charges:    PT Evaluation $PT Eval Low Complexity: 1 Procedure PT Treatments $Gait Training: 8-22 mins   PT G Codes:        Van Clines, PT  Acute Rehabilitation Services Pager [phone] Office [phone]   Levi Aland 11/01/2016, 9:22 AM

## 2016-11-01 NOTE — Care Management Note (Signed)
Case Management Note  Patient Details  Name: Arvella NighGloria C Coop MRN: [id number] Date of Birth: 12/24/1940  Subjective/Objective:                 Spoke with patient and daughter at the bedside. They state they will return home at DC with res[facility] HH PT and [facility]. Resumption order placed and Adacia, clinical liaison for [facility] notified of discharge in next day or so possible. Patient endorses rolator at home. No other CM needs identified at this time.    Action/Plan:   Expected Discharge Date:                  Expected Discharge Plan:  Home w Home Health Services  In-House Referral:     Discharge planning Services  CM Consult  Post Acute Care Choice:  Home Health, Resumption of Svcs/PTA Provider Choice offered to:  Patient  DME Arranged:    DME Agency:     HH Arranged:  PT, RN HH Agency:  [facility]  Status of Service:  In process, will continue to follow  If discussed at Long Length of Stay Meetings, dates discussed:    Additional Comments:  Lawerance SabalDebbie Chiniqua Kilcrease, RN 11/01/2016, 1:14 PM

## 2016-11-01 NOTE — Progress Notes (Signed)
Transitions of Care Pharmacy Note  Plan:  Educated on hydralazine and furosemide --------------------------------------------- Jamie Mason is an 76 y.o. female who presents with a chief complaint of heart failure exacerbation. In anticipation of discharge, pharmacy has reviewed this patient's prior to admission medication history, as well as [facility]  Current medication indications, dosing, frequency, and notable side effects reviewed with patient. patient verbalized understanding of current inpatient medication regimen and is aware that the After Visit Summary when presented, will represent the most accurate medication list at discharge.   Jamie Mason did not express any concerns. We discussed her new hydralazine and her dose change for furosemide. We also briefly went over her chronic meds that have not changed.  Assessment: Understanding of regimen: good Understanding of indications: good Potential of compliance: good Barriers to Obtaining Medications: No  Patient instructed to contact inpatient pharmacy team with further questions or concerns if needed.    Time spent preparing for discharge counseling: 10 Time spent counseling patient: 10   Thank you for allowing pharmacy to be a part of this patient's care.  Gwyndolyn KaufmanKai Trevante Tennell Bernette Redbird(Kenny), PharmD  PGY1 Pharmacy Resident Pager: [phone] 11/01/2016 4:52 PM

## 2016-11-01 NOTE — Discharge Summary (Signed)
Name: Jamie Mason MRN: [id number] DOB: 1940/12/05 76 y.o. 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)   Atrial flutter (HCC)   Discharge Medications: Allergies as of 11/01/2016      Reactions   Lisinopril Swelling   Tongue swelling    Ace Inhibitors Other (See Comments)   unknown   Codeine Itching, Rash   Penicillins Itching, Rash   Has patient had a PCN reaction causing immediate rash, facial/tongue/throat swelling, SOB or lightheadedness with hypotension: unknown Has patient had a PCN reaction causing severe rash involving mucus membranes or skin necrosis: unknown Has patient had a PCN reaction that required hospitalization: no Has patient had a PCN reaction occurring within the last 10 years: no If all of the above answers are "NO", then may proceed with Cephalosporin use.   Tomato    Itchy rash      Medication List    STOP taking these medications   metFORMIN 500 MG tablet Commonly known as:  GLUCOPHAGE     TAKE these medications   albuterol 108 (90 Base) MCG/ACT inhaler Commonly known as:  PROVENTIL HFA;VENTOLIN HFA Inhale 2 puffs into the lungs every 6 (six) hours as needed for wheezing or shortness of breath.   amiodarone 200 MG tablet Commonly known as:  PACERONE Take 1 tablet (200 mg total) by mouth daily. 2 tabs twice daily x5 days. Then 1 tab twice daily x2 weeks. Then one tab daily. What changed:  how much to take  when to take this   amLODipine 10 MG tablet Commonly known as:  NORVASC Take 1 tablet (10 mg total) by mouth daily. What changed:  medication strength  how much to take   apixaban 5 MG Tabs  tablet Commonly known as:  ELIQUIS Take 1 tablet (5 mg total) by mouth 2 (two) times daily.   ASPIR-LOW 81 MG EC tablet Generic drug:  aspirin TAKE 1 TABLET EVERY DAY   atorvastatin 40 MG tablet Commonly known as:  LIPITOR Take 1 tablet (40 mg total) by mouth daily.   carvedilol 25 MG tablet Commonly known as:  COREG Take 1 tablet (25 mg total) by mouth 2 (two) times daily with a meal.   furosemide 40 MG tablet Commonly known as:  LASIX Take 2 tablets by mouth every morning and 1 tablet by mouth every afternoon What changed:  medication strength  how much to take  how to take this  when to take this  additional instructions   gabapentin 100 MG capsule Commonly known as:  NEURONTIN Take 2 capsules (200 mg total) by mouth 3 (three) times daily.   hydrALAZINE 50 MG tablet Commonly known as:  APRESOLINE Take 1 tablet (50 mg total) by mouth 3 (three) times daily.   hydrocerin Crea Apply to bilateral LEs after cleansing on Mondays and Thursdays (prior to [employer] application).       Disposition and follow-up:   Jamie Mason was [facility] in Good condition.  At the hospital follow up visit please address:  1.  Acute on chronic heart failure: She was treated for hypoxic respiratory failure without a very large volume overload or weight gain, attributed  to her uncontrolled hypertension over 200 SBP at the time. She was discharged on a reduced dose of lasix at 80mg  qAM and 40mg  qPM due to mild decrease in renal function after diuresis in the hospital. Assess for volume status, renal function, and adjust as appropriate.  2.  Hypertension: She was discharged on increased amlodipine and hydralazine after this hospitalization. Please ask about medication tolerance and titrate as needed.  3.  Pending labs/ test needing follow-up: Bmet  Follow-up Appointments: Follow-up Information    [facility] Follow up.    Specialty:  Home Health Services Why:  Resume HH PT and RN.  Contact information: [address] [place] [address] [phone]           Hospital Course by problem list: 1. Acute on Chronic Heart Failure / Hypertension Jamie Mason presented to the ED via EMS on 5/17 with shortness of breath found to be in hypoxic respiratory failure with acute onset pulmonary edema. She missed her morning doses of medications prior to this event. She was found to have a blood pressure of 215/113 and O2 saturation of 85% on BiPAP. Her symptoms improved rapidly in the ED with IV Lasix and blood pressure control via a nitroglycerin drip. Over the course of her hospitalization, she was transitioned from the nitroglycerin drip to oral medications for blood pressure control- carvedilol 25mg  BID, amlodipine 10mg , and hydralazine 50mg  TID. She was also diuresed with IV Lasix. Her symptoms improved and she was transitioned to oral Lasix at her home dose of 80mg  twice daily. On 5/22, due to an increased creatinine (1.48 upon admission, 1.72 on 5/22), her Lasix was held, and she will be discharged on a lower-than-home dose of 80mg  qAM, 40mg  qPM. She had persistent dyspnea with activity attributed to her deconditioning, and home health PT was ordered.  2. Type II Diabetes Home metformin was held during admission due to AKI (creatinine upon admission of 1.48 with 1.1-1.2 baseline). CBGs were controlled in 100s on a sensitive correction scale. She can resume her metformin upon discharge.  Discharge Vitals:   BP 125/67 (BP Location: Left Arm)   Pulse 60   Temp 98.6 F (37 C) (Oral)   Resp 20   Ht 5\' 5"  (1.651 m)   Wt (!) 317 lb 12.8 oz (144.2 kg)   SpO2 100%   BMI 52.88 kg/m   Discharge Instructions: Discharge Instructions    Call MD for:  difficulty breathing, headache or visual disturbances    Complete by:  As directed    Diet - low sodium heart healthy    Complete by:  As directed    Discharge  instructions    Complete by:  As directed    After leaving you should change to taking your fluid pills 80mg  in the morning and 40mg  in the afternoon. This is less than your previous instructed afternoon dose to make sure your kidney function is recovering back to normal.  You should follow up with your heart doctor on June 1.   Increase activity slowly    Complete by:  As directed       Signed: Fuller Planhristopher W Aili Casillas, MD PGY-II Internal Medicine Resident Pager# [phone] 11/04/2016, 7:32 AM

## 2016-11-01 NOTE — Progress Notes (Signed)
Subjective: Jamie Mason is complaining of abdominal pressure/pain this morning. She states that she has not had a bowel movement for approximately four days. She ambulated with nursing staff yesterday and had a desaturation to 84% with 10 feet. Today, she ambulated with PT with her sats remaining at or above 94%. She explains that she does not do much walking at home -- generally just from her bed to the bathroom -- and does not leave the house much. Currently, she denies shortness of breath or chest pain.  Objective: Vital signs in last 24 hours: Vitals:   10/31/16 1243 10/31/16 1717 10/31/16 2100 11/01/16 0505  BP: (!) 154/53 (!) 125/50 124/72 124/73  Pulse: 67 64 64 64  Resp: 20  18 20   Temp: 98.3 F (36.8 C)  98.7 F (37.1 C) 98.9 F (37.2 C)  TempSrc: Oral  Oral Oral  SpO2: 98% 97% 99% 100%  Weight:    (!) 144.2 kg (317 lb 12.8 oz)  Height:       Physical Exam General: Obesefemale sitting up in bed. She is conversing comfortably on room air. HEENT: Moist mucus membranes. Cardiac: Regular rate and rhythm. No murmurs, rubs, or gallops. Pulmonary: Normal work of breathing. Decreased breath sounds throughout. Lungs clear to auscultation bilaterally. Abdominal: Distended and mildly tender to palpation throughout. Hypoactive bowel sounds. Extremities: Warm and dry. No peripheral edema.Healing venous ulcers over the medial malleoli bilaterally.  Lab Results: Basic Metabolic Panel:  Recent Labs Lab 10/28/16 0316  10/31/16 0415 11/01/16 0540  NA 140  < > 138 138  K 4.1  < > 4.4 4.7  CL 102  < > 103 101  CO2 28  < > 28 29  GLUCOSE 191*  < > 150* 138*  BUN 21*  < > 42* 41*  CREATININE 1.44*  < > 1.62* 1.72*  CALCIUM 8.7*  < > 8.9 9.1  MG 1.9  --   --   --   < > = values in this interval not displayed.  Problem List: -- Acute on chronic heart failure with preserved ejection fraction -- Hypertension -- Constipation -- Diffuse coronary artery disease -- AKI -- T2DM --  Atrial flutter status post cardioversion -- Hyperlipidemia -- Obesity -- Chronic venous insufficiency   Assessment and Plan By Problem: #Acute on chronic heart failure with preserved ejection fraction #Hypertension Blood pressures are well-controlled on oral medications (hydralazine, carvedilol, and amlodipine). Symptoms have improved significantly with blood pressure control and diuresis, and she is breathing well on room air. She did desaturate to 84% with ambulation yesterday, but she walked with PT today with sats at or greater than 94%; home health PT was recommended. Her shortness of breath and desaturation with exertion is likely more related to deconditioning / lack of regular activity at home than to volume overload, given that she is euvolemic on exam. We will hold her Lasix today given that her creatinine is at 1.72, up from 1.62 yesterday and a baseline of ~1.2. Additionally, we will decrease her daily dose of Lasix upon discharge. Her creatinine can be rechecked and this dose can be titrated as an outpatient. -- Continue hydralazine 50mg  every 6 hours -- Continue home carvedilol 25mg  twice daily -- Continue home amlodipine at increased (from home)dose of 10mg  daily -- Hold Lasix today;upon discharge, decrease dose to 80mg  every morning, 40mg  every evening -- Incentive spirometry -- Home health PT upon discharge  #Constipation Will give polyethylene glycol and senna this morning and reassess prior to  discharge.  #Diffuse coronary artery disease History of NSTEMI secondary to demand ischemia during hospitalization from 07/06/16-07/16/16. Left heart catheterization at that time showed chronic total occlusion of mid RCA, moderate disease of LMCA, LAD, and left circumflex. -- Continue home aspirin 81mg  daily -- Continue home carvedilol 25mg  twice daily -- Continue home atorvastatin 40mg  daily  #AKI Creatinine of 1.48 upon admission, 1.72 today.Baseline appears to be ~1.2. We will  hold her Lasix today and decrease her daily dose to 80mg  every morning, 40mg  every evening upon discharge. Her creatinine can be rechecked and this dose can be titrated as an outpatient.  #T2DM Most recent Hgb A1c 7.3 in January 2018. On metformin 500mg  twice daily at home. Can resume upon discharge.  #Atrial flutter status post cardioversion Status post cardioversion on 07/07/16. Was in sinus rhythm when monitored during this admission. -- Continue home amiodarone 200mg  daily -- Continue home Eliquis 5mg  twice daily  #Hyperlipidemia -- Continue home atorvastatin 40mg  daily  #Chronic venous insufficiency Chronic ulcers on medical malleoli appear to be healing. Lower extremity edema has improvedwith diuresis.  FENGI: Heart Healthy DVT PPX: Home Eliquis Code: Full Dispo: Stable for discharge later today.  This is a Psychologist, occupational Note.  The care of the patient was discussed with Dr. Dimple Casey and the assessment and plan formulated with their assistance.  Please see their attached note for official documentation of the daily encounter.   LOS: 5 days   Jamie Mason, Medical Student 11/01/2016, 10:58 AM

## 2016-11-01 NOTE — Progress Notes (Signed)
Pt c/o constipation. MD notified.

## 2016-11-02 ENCOUNTER — Other Ambulatory Visit: Payer: Self-pay | Admitting: Pharmacist

## 2016-11-02 ENCOUNTER — Other Ambulatory Visit: Payer: Self-pay | Admitting: *Deleted

## 2016-11-02 DIAGNOSIS — I5032 Chronic diastolic (congestive) heart failure: Secondary | ICD-10-CM

## 2016-11-02 NOTE — Patient Outreach (Signed)
[facility] Care Management  11/02/2016  Jamie NighGloria C Mason 09/12/1940 [id number]   Notified by hospital liaison that member was discharged from hospital yesterday.  Call placed to member to follow up on discharge and initiate transition of care program, no answer.  HIPAA compliant voice message left.  Will follow up within the next week.  Kemper DurieMonica Lerone Onder, [place]RN, MSN [facility] Care Management  [facility] Care Manager [phone]

## 2016-11-02 NOTE — Telephone Encounter (Signed)
No, sorry Dr. Rogelia BogaButcher, [facility] has been [facility]. My mistake

## 2016-11-02 NOTE — Telephone Encounter (Signed)
Dr Selena BattenKim Do you really want this sent to [facility] outpt pharm?

## 2016-11-02 NOTE — Patient Outreach (Signed)
Have made three phone call attempts and sent letter. No response within 14 days. Will now close pharmacy episode.  Delyle Weider, PharmD, BCACP Clinical Pharmacist [facility] Care Management [phone]  

## 2016-11-03 ENCOUNTER — Other Ambulatory Visit: Payer: Self-pay | Admitting: *Deleted

## 2016-11-03 MED ORDER — CARVEDILOL 25 MG PO TABS: 25.0000 mg | ORAL_TABLET | Freq: Two times a day (BID) | ORAL | 3 refills | Status: DC

## 2016-11-03 NOTE — Patient Outreach (Signed)
[facility] Care Management  11/03/2016  Jamie NighGloria C Gunderson 07/26/1940 [id number]   Voice message received from member after hours, returning call from the care manager.  Call placed today to restart transition of care program.  She report that she is "feeling a lot better" since she has been discharged.  She state that she was readmitted because she "couldn't breathe."  Heart failure complications discussed again as well and management of condition, to include daily weights and diet.  She report she will again attempt to monitor her diet better.  She is made aware that it has been requested for her home health agency to provide telemonitoring.  Telehealth/monitoring explained, she states "why I can't just do that myself?"  Benefits of program explained to include compliance, accuracy, and immediate review of providers.  She verbalizes understanding, denies being contacted as of yet.    Patient was recently discharged from hospital and all medications have been reviewed.  She report compliance with all medications, including newly prescribed Hydralazine.  He is reminded that his medication is to be taken 3 times a day.  She verbalizes understanding, denies questions regarding any medications.  She has follow up appointment with primary MD on Tuesday, confirms she has transportation.  Encouraged to contact with questions, will follow up next week.  Kemper DurieMonica Christinia Lambeth, [place]RN, MSN [facility] Care Management  [facility] Care Manager [phone]

## 2016-11-04 ENCOUNTER — Telehealth: Payer: Self-pay | Admitting: *Deleted

## 2016-11-04 NOTE — Telephone Encounter (Signed)
10 mg per D/C summary Dr Dimple Caseyice. I have not seen her since D/C

## 2016-11-04 NOTE — Telephone Encounter (Signed)
Fax from [facility] for clarification of Amlodipine. Wants to know if Amlodipine dose is 10 mg or 2.5 mg? Last rx was for 10 mg on 5/22 per chart. Thanks

## 2016-11-04 NOTE — Telephone Encounter (Signed)
[facility] pharmacy informed of Amlodipine 10 mg pe Dr Rogelia BogaButcher.

## 2016-11-05 DIAGNOSIS — I2510 Atherosclerotic heart disease of native coronary artery without angina pectoris: Secondary | ICD-10-CM | POA: Diagnosis not present

## 2016-11-05 DIAGNOSIS — Z87891 Personal history of nicotine dependence: Secondary | ICD-10-CM | POA: Diagnosis not present

## 2016-11-05 DIAGNOSIS — N182 Chronic kidney disease, stage 2 (mild): Secondary | ICD-10-CM | POA: Diagnosis not present

## 2016-11-05 DIAGNOSIS — Z7982 Long term (current) use of aspirin: Secondary | ICD-10-CM | POA: Diagnosis not present

## 2016-11-05 DIAGNOSIS — I130 Hypertensive heart and chronic kidney disease with heart failure and stage 1 through stage 4 chronic kidney disease, or unspecified chronic kidney disease: Secondary | ICD-10-CM | POA: Diagnosis not present

## 2016-11-05 DIAGNOSIS — I5032 Chronic diastolic (congestive) heart failure: Secondary | ICD-10-CM | POA: Diagnosis not present

## 2016-11-05 DIAGNOSIS — E1122 Type 2 diabetes mellitus with diabetic chronic kidney disease: Secondary | ICD-10-CM | POA: Diagnosis not present

## 2016-11-05 DIAGNOSIS — Z7901 Long term (current) use of anticoagulants: Secondary | ICD-10-CM | POA: Diagnosis not present

## 2016-11-05 DIAGNOSIS — I4892 Unspecified atrial flutter: Secondary | ICD-10-CM | POA: Diagnosis not present

## 2016-11-08 ENCOUNTER — Ambulatory Visit: Payer: Medicare HMO

## 2016-11-09 DIAGNOSIS — Z7982 Long term (current) use of aspirin: Secondary | ICD-10-CM | POA: Diagnosis not present

## 2016-11-09 DIAGNOSIS — I130 Hypertensive heart and chronic kidney disease with heart failure and stage 1 through stage 4 chronic kidney disease, or unspecified chronic kidney disease: Secondary | ICD-10-CM | POA: Diagnosis not present

## 2016-11-09 DIAGNOSIS — I4892 Unspecified atrial flutter: Secondary | ICD-10-CM | POA: Diagnosis not present

## 2016-11-09 DIAGNOSIS — I2510 Atherosclerotic heart disease of native coronary artery without angina pectoris: Secondary | ICD-10-CM | POA: Diagnosis not present

## 2016-11-09 DIAGNOSIS — Z87891 Personal history of nicotine dependence: Secondary | ICD-10-CM | POA: Diagnosis not present

## 2016-11-09 DIAGNOSIS — I5032 Chronic diastolic (congestive) heart failure: Secondary | ICD-10-CM | POA: Diagnosis not present

## 2016-11-09 DIAGNOSIS — E1122 Type 2 diabetes mellitus with diabetic chronic kidney disease: Secondary | ICD-10-CM | POA: Diagnosis not present

## 2016-11-09 DIAGNOSIS — N182 Chronic kidney disease, stage 2 (mild): Secondary | ICD-10-CM | POA: Diagnosis not present

## 2016-11-09 DIAGNOSIS — Z7901 Long term (current) use of anticoagulants: Secondary | ICD-10-CM | POA: Diagnosis not present

## 2016-11-09 NOTE — Progress Notes (Signed)
Cardiology Office Note   Date:  11/11/2016   ID:  Jamie Mason, Jamie Mason 03-15-1941, MRN [id number]  PCP:  Jamie Spain, MD  Cardiologist:   Jamie Haws, MD   No chief complaint on file.     History of Present Illness: Jamie Mason is a 76 y.o. female who presents for f/u afib. Previous patient of Dr Jamie Mason referred back by primary Morbidly obese.Had Jamie Mason 07/13/16 on eliquis. CAD with cath Dr End 07/11/16 chronic total occlusion of RCA with left to right collaterals. LVEDP 28 EF normal  Also reviewed TTE from 06/27/16 EF 60-65% moderate LVH grade 2 diastolic mild MR PA estimate 47 mmHg   Former smoker, HTN , elevated lipids , DM Activity limited by obesity And chronic back pain   Since d/c doing well weight down No chest pain chronic edema and dyspnea    Past Medical History:  Diagnosis Date  . Atrial flutter with rapid ventricular response (HCC) 07/11/2016  . CAD (coronary artery disease) 05/04/2011  . Diabetes mellitus without complication (HCC)   . Hyperlipidemia   . Hypertension   . Lumbar disc disease   . Morbid Obesity 05/02/2011  . Non-ST elevation myocardial infarction (NSTEMI), initial episode of care [facility]) 05/02/2011    Past Surgical History:  Procedure Laterality Date  . CARDIAC CATHETERIZATION N/A 07/11/2016   Procedure: Left Heart Cath and Coronary Angiography;  Surgeon: Jamie Kendall, MD;  Location: [facility];  Service: Cardiovascular;  Laterality: N/A;  . CARDIOVERSION N/A 07/13/2016   Procedure: CARDIOVERSION;  Surgeon: Jamie Stade, MD;  Location: [facility] ENDOSCOPY;  Service: Cardiovascular;  Laterality: N/A;  . LEFT HEART CATHETERIZATION WITH CORONARY ANGIOGRAM N/A 05/03/2011   Procedure: LEFT HEART CATHETERIZATION WITH CORONARY ANGIOGRAM;  Surgeon: Jamie Boyer, MD;  Location[facility] C[facility];  Service: Cardiovascular;  Laterality: N/A;  . none       Current Outpatient Prescriptions  Medication Sig Dispense Refill  . amiodarone  (PACERONE) 200 MG tablet Take 1 tablet (200 mg total) by mouth daily. 2 tabs twice daily x5 days. Then 1 tab twice daily x2 weeks. Then one tab daily. 30 tablet 2  . amLODipine (NORVASC) 2.5 MG tablet Take 2.5 mg by mouth daily.    Marland Kitchen apixaban (ELIQUIS) 5 MG TABS tablet Take 1 tablet (5 mg total) by mouth 2 (two) times daily. 180 tablet 3  . aspirin (ASPIR-LOW) 81 MG EC tablet Take 81 mg by mouth daily. Swallow whole.    Marland Kitchen atorvastatin (LIPITOR) 40 MG tablet Take 1 tablet (40 mg total) by mouth daily. 90 tablet 3  . carvedilol (COREG) 25 MG tablet Take 1 tablet (25 mg total) by mouth 2 (two) times daily with a meal. 90 tablet 3  . furosemide (LASIX) 40 MG tablet Take 2 tablets by mouth every morning and 1 tablet by mouth every afternoon 90 tablet 2  . gabapentin (NEURONTIN) 100 MG capsule Take 2 capsules (200 mg total) by mouth 3 (three) times daily. 180 capsule 2  . hydrocerin (EUCERIN) CREA Apply to bilateral LEs after cleansing on Mondays and Thursdays (prior to [employer] application). 113 g 0  . metFORMIN (GLUCOPHAGE) 500 MG tablet Take 1 tablet (500 mg total) by mouth 2 (two) times daily with a meal. 180 tablet 3  . albuterol (PROVENTIL HFA;VENTOLIN HFA) 108 (90 Base) MCG/ACT inhaler Inhale 2 puffs into the lungs every 6 (six) hours as needed for wheezing or shortness of breath. (Patient not taking: Reported  on 11/11/2016) 3 Inhaler 3  . hydrALAZINE (APRESOLINE) 50 MG tablet Take 1 tablet (50 mg total) by mouth 3 (three) times daily. 90 tablet 2   No current facility-administered medications for this visit.     Allergies:   Lisinopril; Ace inhibitors; Codeine; Penicillins; and Tomato    Social History:  The patient  reports that she quit smoking about 5 years ago. Her smoking use included Cigarettes. She has a 25.00 pack-year smoking history. She has never used smokeless tobacco. She reports that she does not drink alcohol or use drugs.   Family History:  The patient's family history includes  Cancer in her father; Stroke in her mother.    ROS:  Please see the history of present illness.   Otherwise, review of systems are positive for none.   All other systems are reviewed and negative.    PHYSICAL EXAM: VS:  BP (!) 142/68   Pulse 63   Ht 5\' 5"  (1.651 m)   Wt (!) 311 lb 12.8 oz (141.4 kg)   SpO2 97%   BMI 51.89 kg/m  , BMI Body mass index is 51.89 kg/m.  Affect appropriate Obese black female poor hygiene  HEENT: normal Neck supple with no adenopathy JVP normal no bruits no thyromegaly Lungs clear with no wheezing and Jamie diaphragmatic motion Heart:  S1/S2 SEM  murmur, no rub, gallop or click PMI normal Abdomen: benighn, BS positve, no tenderness, no AAA no bruit.  No HSM or HJR Distal pulses intact with no bruits Plus 2-3  Bilateral brawny  edema Neuro non-focal Skin warm and dry No muscular weakness    EKG:  ST LVH with strain rate 104 10/27/16   Recent Labs: 09/12/2016: ALT 13 10/06/2016: TSH 3.700 10/27/2016: B Natriuretic Peptide 500.7; Hemoglobin 11.5; Platelets 353 10/28/2016: Magnesium 1.9 11/01/2016: BUN 41; Creatinine, Ser 1.72; Potassium 4.7; Sodium 138    Lipid Panel    Component Value Date/Time   CHOL 170 03/19/2014 1636   TRIG 79 03/19/2014 1636   HDL 26 (L) 03/19/2014 1636   CHOLHDL 6.5 03/19/2014 1636   VLDL 16 03/19/2014 1636   LDLCALC 128 (H) 03/19/2014 1636      Wt Readings from Last 3 Encounters:  11/11/16 (!) 311 lb 12.8 oz (141.4 kg)  11/01/16 (!) 317 lb 12.8 oz (144.2 kg)  10/19/16 (!) 324 lb 3.2 oz (147.1 kg)      Other studies Reviewed: Additional studies/ records that were reviewed today include: Notes from Dr [facility] echo , [facility] and cath .    ASSESSMENT AND PLAN:  1.  PAF post [place] [facility] maintaining NSR continue DOAC and coreg 2. CAD no chest pain cath 06/2016 occluded RCA with collaterals stable  3. Venous Stasis/Edema continue bid lasix elevate legs as needed  4. HTN  Script for hydralazine called in she has not  been taking  5. DM Discussed low carb diet.  Target hemoglobin A1c is 6.5 or less.  Continue current medications. 6. Cholesterol  Labs with primary  Cholesterol is at goal.  Continue current dose of statin and diet Rx.  No myalgias or side effects.  F/U  LFT's in 6 months. Lab Results  Component Value Date   LDLCALC 128 (H) 03/19/2014             7. Diastolic Dysfunction weight down 12 lbs low level dyspnea improved    Current medicines are reviewed at length with the patient today.  The patient does not have concerns regarding medicines.  The following changes have been made:  no change  Labs/ tests ordered today include: None  No orders of the defined types were placed in this encounter.    Disposition:   FU with 6 months      Signed, Jamie HawsPeter Len Azeez, MD  11/11/2016 10:35 AM    [facility] [address], [place], [place]  [address] Phone: [phone]) [phone]; Fax: [phone]

## 2016-11-11 ENCOUNTER — Ambulatory Visit (INDEPENDENT_AMBULATORY_CARE_PROVIDER_SITE_OTHER): Payer: Medicare HMO | Admitting: Cardiovascular Disease

## 2016-11-11 ENCOUNTER — Ambulatory Visit (INDEPENDENT_AMBULATORY_CARE_PROVIDER_SITE_OTHER): Payer: Medicare HMO | Admitting: Internal Medicine

## 2016-11-11 ENCOUNTER — Encounter: Payer: Self-pay | Admitting: Cardiovascular Disease

## 2016-11-11 ENCOUNTER — Encounter (INDEPENDENT_AMBULATORY_CARE_PROVIDER_SITE_OTHER): Payer: Self-pay

## 2016-11-11 ENCOUNTER — Other Ambulatory Visit: Payer: Self-pay | Admitting: *Deleted

## 2016-11-11 VITALS — BP 142/68 | HR 63 | Ht 65.0 in | Wt 311.8 lb

## 2016-11-11 VITALS — BP 163/44 | HR 65 | Temp 98.5°F | Ht 65.0 in | Wt 311.0 lb

## 2016-11-11 DIAGNOSIS — R0609 Other forms of dyspnea: Secondary | ICD-10-CM

## 2016-11-11 DIAGNOSIS — I10 Essential (primary) hypertension: Secondary | ICD-10-CM | POA: Diagnosis not present

## 2016-11-11 DIAGNOSIS — Z888 Allergy status to other drugs, medicaments and biological substances status: Secondary | ICD-10-CM

## 2016-11-11 DIAGNOSIS — E669 Obesity, unspecified: Secondary | ICD-10-CM | POA: Diagnosis not present

## 2016-11-11 DIAGNOSIS — N182 Chronic kidney disease, stage 2 (mild): Secondary | ICD-10-CM | POA: Diagnosis not present

## 2016-11-11 DIAGNOSIS — E1122 Type 2 diabetes mellitus with diabetic chronic kidney disease: Secondary | ICD-10-CM

## 2016-11-11 DIAGNOSIS — I5032 Chronic diastolic (congestive) heart failure: Secondary | ICD-10-CM

## 2016-11-11 DIAGNOSIS — I4892 Unspecified atrial flutter: Secondary | ICD-10-CM | POA: Diagnosis not present

## 2016-11-11 DIAGNOSIS — L819 Disorder of pigmentation, unspecified: Secondary | ICD-10-CM

## 2016-11-11 DIAGNOSIS — Z5189 Encounter for other specified aftercare: Secondary | ICD-10-CM

## 2016-11-11 DIAGNOSIS — Z7984 Long term (current) use of oral hypoglycemic drugs: Secondary | ICD-10-CM

## 2016-11-11 DIAGNOSIS — Z87891 Personal history of nicotine dependence: Secondary | ICD-10-CM

## 2016-11-11 DIAGNOSIS — Z79899 Other long term (current) drug therapy: Secondary | ICD-10-CM

## 2016-11-11 DIAGNOSIS — I130 Hypertensive heart and chronic kidney disease with heart failure and stage 1 through stage 4 chronic kidney disease, or unspecified chronic kidney disease: Secondary | ICD-10-CM

## 2016-11-11 MED ORDER — HYDRALAZINE HCL 50 MG PO TABS: 50.0000 mg | ORAL_TABLET | Freq: Three times a day (TID) | ORAL | 2 refills | Status: DC

## 2016-11-11 MED ORDER — DILTIAZEM HCL ER COATED BEADS 120 MG PO CP24: 240.0000 mg | ORAL_CAPSULE | Freq: Every day | ORAL | 2 refills | Status: DC

## 2016-11-11 MED ORDER — ALBUTEROL SULFATE HFA 108 (90 BASE) MCG/ACT IN AERS: 2.0000 | INHALATION_SPRAY | Freq: Four times a day (QID) | RESPIRATORY_TRACT | 3 refills | Status: DC | PRN

## 2016-11-11 NOTE — Assessment & Plan Note (Addendum)
Discharged after CHF exacerbation 10 days ago. At discharge her home Lasix dose was lowered compared to prior to admission due to worsening renal function.  Weight 317 lbs at discharge on 5/22.  Baseline unclean, was ~300 lbs as recently as 09/2016. Seems close to euvolemic today, with lower extremity edema that seems chronic and multifactorial and no pulmonary edema.  Current medications: lasix 80 mg qAM and 40 mg qPM  -Continue current Lasix -check BMP

## 2016-11-11 NOTE — Assessment & Plan Note (Addendum)
Lab Results  Component Value Date   HGBA1C 6.8 10/06/2016   HGBA1C 7.3 (H) 06/27/2016   HGBA1C 6.8 12/31/2015    No results for input(s): GLUCAP in the last 72 hours.   Current medications: metformin 500 mg BID Current insulin: none  She has been taking her metformin since discharge.  Assessment HgbA1c goal: <7.0 Glycemic control: well controlled Complications: CKD  Plan Medications: continue current meds Insulin: none Other: -f/u 1 month with PCP

## 2016-11-11 NOTE — Assessment & Plan Note (Addendum)
BP Readings from Last 3 Encounters:  11/11/16 (!) 163/44  11/11/16 (!) 142/68  11/01/16 125/67   Lab Results  Component Value Date   CREATININE 1.72 (H) 11/01/2016   Lab Results  Component Value Date   K 4.7 11/01/2016    Current medications: Amlodipine 10 mg daily, carvedilol 25 mg BID, lasix 80 mg qAM and 40 mg qPM.    Has not started  hydralazine 50 mg TID yet, and last took meds yesterday evening.  With her diabetic nephropathy and allergy to ACEI, would benefit from NDHP CCB. However, she has had problems with bradycardia previously.   Assessment BP goal: <140/90 BP control: uncontrolled  Room for improvement in medication adherence, including timing of twice a day medications.  Plan Medications: Discontinue amlodipine. Start diltiazem 120 mg daily. Other: -f/u with PCP in 1 month -Check blood pressure and heart rate, titrate meds as tolerated

## 2016-11-11 NOTE — Progress Notes (Signed)
   CC: "I was in the hospital last week."  HPI:  Ms.Vivion C Fredric MareBailey is a 76 y.o. woman with history of HFpEF, atrial flutter, DM and HTN who presents for hospital follow-up and further management of heart failure.  Ms Fredric MareBailey was admitted May 17-22 with a heart failure exacerbation and hypertensive urgency.  She was hypoxic, presumably due to pulmonary edema. She was diuresed with IV loop diuretics and volume status improved. Due to increase in serum creatinine, she was discharged on a lower dose of oral Lasix then prior to admission.  Since discharge she has been doing well, without recurrent difficulties breathing, worsening leg swelling, chest pain, or other symptoms.  She typically takes her meds in the evening, or for twice daily meds at around 11 am and 5 pm.  Did not take her meds this morning because he is worried about having to urinate while attending her appointments, but reports taking all of her meds last night  She has not started hydralazine yet because it has not yet come in the mail.  Past Medical History:  Diagnosis Date  . Atrial flutter with rapid ventricular response (HCC) 07/11/2016  . CAD (coronary artery disease) 05/04/2011  . Diabetes mellitus without complication (HCC)   . Hyperlipidemia   . Hypertension   . Lumbar disc disease   . Morbid Obesity 05/02/2011  . Non-ST elevation myocardial infarction (NSTEMI), initial episode of care [facility](HCC) 05/02/2011    Review of Systems:   Review of Systems  Constitutional: Negative for chills and fever.  Respiratory: Negative for shortness of breath and wheezing.   Cardiovascular: Positive for leg swelling. Negative for chest pain, palpitations, orthopnea and PND.  Gastrointestinal: Negative for nausea and vomiting.   Physical Exam:  Vitals:   11/11/16 1322  BP: (!) 163/44  Pulse: 65  Temp: 98.5 F (36.9 C)  TempSrc: Oral  SpO2: 99%  Weight: (!) 311 lb (141.1 kg)  Height: 5\' 5"  (1.651 m)   142/68 at cardiologist  earlier today  Physical Exam  Constitutional: She is oriented to person, place, and time.  Obese woman sitting in wheelchair in no distress  Cardiovascular: Normal rate and regular rhythm.   2/6 systolic murmur loudest RUSB  Pulmonary/Chest: Effort normal and breath sounds normal. No respiratory distress. She has no rales.  Musculoskeletal:  Symmetric 3+ edema of bilateral LE  Neurological: She is alert and oriented to person, place, and time.  Skin:  Bialteral lower legs with circumferential hyperpigmentation consistent with venous stasis  Psychiatric: She has a normal mood and affect. Her behavior is normal.    Assessment & Plan:   See Encounters Tab for problem based charting.  Patient discussed with Dr. Rogelia BogaButcher

## 2016-11-11 NOTE — Patient Instructions (Signed)

## 2016-11-11 NOTE — Patient Instructions (Addendum)
For your high blood pressure, I have prescribed you diltiazem to take instead of amlodipine. When you get the diltiazem in the mail, please start taking that and stop taking amlodipine.  For the medicines you take twice per day, carvedilol and Lasix try to take them in the morning and in the evening so that they are closer to 12 hours apart.  If you have worse troubles breathing, gain more than 3 pounds in 1 day or 5 pounds in a week, or have worsening swelling in your legs, please call the clinic for an urgent appointment.

## 2016-11-11 NOTE — Patient Outreach (Signed)
[facility]) Care Management  11/11/2016  Jamie NighGloria C Mason 02/07/1941 [id number]   Weekly transition of care call placed to member, husband answers phone and state she is out, has 2 MD appointments scheduled today.  Noted that she is currently at cardiology office, will have appointment with primary MD this afternoon.  Home visit scheduled for next week, will follow up at that time.  Kemper DurieMonica Maysie Parkhill, [place], MSN [facility] Care Management  [facility] Care Manager [phone]

## 2016-11-12 LAB — BMP8+ANION GAP
Anion Gap: 15 mmol/L (ref 10.0–18.0)
BUN/Creatinine Ratio: 16 (ref 12–28)
BUN: 25 mg/dL (ref 8–27)
CALCIUM: 9.5 mg/dL (ref 8.7–10.3)
CO2: 27 mmol/L (ref 18–29)
Chloride: 101 mmol/L (ref 96–106)
Creatinine, Ser: 1.53 mg/dL — ABNORMAL HIGH (ref 0.57–1.00)
GFR calc non Af Amer: 33 mL/min/{1.73_m2} — ABNORMAL LOW (ref >59)
GFR, EST AFRICAN AMERICAN: 38 mL/min/{1.73_m2} — AB (ref >59)
Glucose: 154 mg/dL — ABNORMAL HIGH (ref 65–99)
Potassium: 4.4 mmol/L (ref 3.5–5.2)
Sodium: 143 mmol/L (ref 134–144)

## 2016-11-13 DIAGNOSIS — I2510 Atherosclerotic heart disease of native coronary artery without angina pectoris: Secondary | ICD-10-CM | POA: Diagnosis not present

## 2016-11-13 DIAGNOSIS — L8990 Pressure ulcer of unspecified site, unspecified stage: Secondary | ICD-10-CM | POA: Diagnosis not present

## 2016-11-13 DIAGNOSIS — J9620 Acute and chronic respiratory failure, unspecified whether with hypoxia or hypercapnia: Secondary | ICD-10-CM | POA: Diagnosis not present

## 2016-11-14 ENCOUNTER — Encounter: Payer: Self-pay | Admitting: Internal Medicine

## 2016-11-14 ENCOUNTER — Other Ambulatory Visit: Payer: Self-pay | Admitting: *Deleted

## 2016-11-14 NOTE — Patient Outreach (Signed)
[facility]) Care Management  11/14/2016  Arvella NighGloria C Depascale 06/15/1940 [id number]   Voice message received from member requesting a call back. Also notified by care management assistant that member contacted 24 hour nurse triage line on 6/3 regarding shortness of breath and being out of Ventolin.  Per note from triage line, member was instructed to go to ED immediately.  Call placed to member to follow up on concerns, no answer.  HIPAA compliant voice message left.  Per chart, she has not been seen in the ED as of yet.  Home visit scheduled for tomorrow, will follow up at that time if no call back.  Kemper DurieMonica Christiano Blandon, [place]RN, MSN [facility] Care Management  [facility] Care Manager [phone]

## 2016-11-14 NOTE — Progress Notes (Signed)
Internal Medicine Clinic Attending  Case discussed with Dr. O'Sullivan at the time of the visit.  We reviewed the resident's history and exam and pertinent patient test results.  I agree with the assessment, diagnosis, and plan of care documented in the resident's note. 

## 2016-11-15 ENCOUNTER — Ambulatory Visit (INDEPENDENT_AMBULATORY_CARE_PROVIDER_SITE_OTHER): Payer: Medicare HMO | Admitting: Internal Medicine

## 2016-11-15 ENCOUNTER — Other Ambulatory Visit: Payer: Self-pay

## 2016-11-15 ENCOUNTER — Other Ambulatory Visit: Payer: Self-pay | Admitting: *Deleted

## 2016-11-15 ENCOUNTER — Telehealth: Payer: Self-pay | Admitting: *Deleted

## 2016-11-15 ENCOUNTER — Encounter: Payer: Self-pay | Admitting: Internal Medicine

## 2016-11-15 VITALS — BP 153/45 | HR 62 | Temp 98.6°F | Ht 65.0 in | Wt 316.0 lb

## 2016-11-15 DIAGNOSIS — K5900 Constipation, unspecified: Secondary | ICD-10-CM | POA: Diagnosis not present

## 2016-11-15 DIAGNOSIS — I5032 Chronic diastolic (congestive) heart failure: Secondary | ICD-10-CM

## 2016-11-15 DIAGNOSIS — N182 Chronic kidney disease, stage 2 (mild): Secondary | ICD-10-CM | POA: Diagnosis not present

## 2016-11-15 DIAGNOSIS — I2510 Atherosclerotic heart disease of native coronary artery without angina pectoris: Secondary | ICD-10-CM | POA: Diagnosis not present

## 2016-11-15 DIAGNOSIS — Z7901 Long term (current) use of anticoagulants: Secondary | ICD-10-CM | POA: Diagnosis not present

## 2016-11-15 DIAGNOSIS — R0609 Other forms of dyspnea: Secondary | ICD-10-CM

## 2016-11-15 DIAGNOSIS — Z7982 Long term (current) use of aspirin: Secondary | ICD-10-CM | POA: Diagnosis not present

## 2016-11-15 DIAGNOSIS — I130 Hypertensive heart and chronic kidney disease with heart failure and stage 1 through stage 4 chronic kidney disease, or unspecified chronic kidney disease: Secondary | ICD-10-CM | POA: Diagnosis not present

## 2016-11-15 DIAGNOSIS — Z87891 Personal history of nicotine dependence: Secondary | ICD-10-CM | POA: Diagnosis not present

## 2016-11-15 DIAGNOSIS — E1122 Type 2 diabetes mellitus with diabetic chronic kidney disease: Secondary | ICD-10-CM | POA: Diagnosis not present

## 2016-11-15 DIAGNOSIS — I4892 Unspecified atrial flutter: Secondary | ICD-10-CM | POA: Diagnosis not present

## 2016-11-15 MED ORDER — FUROSEMIDE 40 MG PO TABS: 40.0000 mg | ORAL_TABLET | Freq: Two times a day (BID) | ORAL | 2 refills | Status: DC

## 2016-11-15 MED ORDER — POLYETHYLENE GLYCOL 3350 17 GM/SCOOP PO POWD: 1.0000 | Freq: Once | ORAL | 0 refills | Status: AC

## 2016-11-15 MED ORDER — ALBUTEROL SULFATE HFA 108 (90 BASE) MCG/ACT IN AERS: 2.0000 | INHALATION_SPRAY | Freq: Four times a day (QID) | RESPIRATORY_TRACT | 0 refills | Status: DC | PRN

## 2016-11-15 MED FILL — VENTOLIN HFA 90 MCG INHALER: 108 (90 BAS | 25 days supply | Qty: 18 | Fill #0

## 2016-11-15 NOTE — Telephone Encounter (Signed)
Per Maxine GlennMonica [facility] nurse), patient is having SOB with chest pressure. Patient told when her breathing gets like this, she will just use her inhaler & would feel better.  Albuterol was sent to mail order pharmacy & not yet available to patient.  Monica [facility]. Patient unable to come for appt due to transportation issues.  Advised if condition gets worse or more problems arise to call us or got to ED/Urgent care.  pls send albuterol to [facility] (I'm unable to open medlist to send the request @ this time) Thanks!

## 2016-11-15 NOTE — Patient Outreach (Signed)
[facility]) Care Management  11/15/2016  Jamie NighGloria C Mason 02/11/1941 [id number]   I received a message from [facility] Lane, RN in regards to assisting with [facility] with her Albuterol inhaler.  She had [facility] to [facility] that she could afford the [facility]160 [facility].  The [facility]er[facility].  Due to her high risk of hospitalization, she qualifies for the Emergency Pharmacy Fund.  I will purchase her albuterol and a family member will pick it up.  I have notified Monica.    Steve Rattlerawn Emberley Kral, PharmD, Psychologist, occupationalBCACP Assistant Clinical Director of [facility] [phone]

## 2016-11-15 NOTE — Patient Instructions (Signed)
It was a pleasure to see you today [name].  Your albuterol inhaler prescription is waiting to be filled at no cost at the outpatient pharmacy, please go there immediately from here to pick that up.  Increase your furosemide (Lasix) pills to TWO 40MG  PILLS TWICE DAILY  I recommend starting miralax 17g once daily for constipation. You should take this once a day until you have regular bowel movements (1/day) then can take it as needed.  If you have financial trouble getting any of your medicines or transportation please call us right away or [facility] with [facility] to help coordinate.  We will plan to see you again in 2 weeks.

## 2016-11-15 NOTE — Telephone Encounter (Signed)
Patient called & is on her way here. Did not set up appt on our earlier conversation due to transportation issues (per patient). She is still having some SOB, appt made @ 3:15 today.

## 2016-11-15 NOTE — Telephone Encounter (Signed)
Am CC'ing Dr Selena BattenKim to ensure this is proper usage of IM outpt pharmacy per [facility] protocol.

## 2016-11-15 NOTE — Patient Outreach (Signed)
Jamie Mason   11/15/2016  Jamie Mason 20-Jan-1941 [id number]  Jamie Mason is an 76 y.o. female  Subjective:   Member alert and oriented x3, complains of chest pressure/tightness.  She refers to the feeling as if she's "breathing against something."  She state she has felt like this since the weekend.  Report she typically uses her inhaler, which provides some relief, but she does not have anymore.  She has requested refill from [facility] mail order, but will have to pay $160 before it can be sent.  She report compliance with all other medications.  Objective:   Review of Systems  HENT: Negative.   Eyes: Negative.   Respiratory: Positive for shortness of breath.   Cardiovascular:       Chest pressure  Gastrointestinal: Negative.   Genitourinary: Negative.   Musculoskeletal: Negative.   Skin: Negative.   Neurological: Positive for weakness.  Endo/Heme/Allergies: Negative.   Psychiatric/Behavioral: Negative.     Physical Exam  Constitutional: She is oriented to person, place, and time. She appears well-developed and well-nourished.  Neck: Normal range of motion.  Cardiovascular: Normal rate, regular rhythm and normal heart sounds.   Respiratory: Breath sounds normal.  GI: Soft. Bowel sounds are normal.  Musculoskeletal: Normal range of motion.  Neurological: She is alert and oriented to person, place, and time.  Skin: Skin is warm and dry.   BP (!) 162/78 (BP Location: Left Arm, Patient Position: Sitting, Cuff Size: Large) Comment: No medications this morning  Pulse 62   Resp 20   Wt (!) 311 lb (141.1 kg) Comment: patient stated  SpO2 99%   BMI 51.75 kg/m   Encounter Medications:   Outpatient Encounter Prescriptions as of 11/15/2016  Medication Sig Note  . albuterol (PROVENTIL HFA;VENTOLIN HFA) 108 (90 Base) MCG/ACT inhaler Inhale 2 puffs into the lungs every 6 (six) hours as needed for wheezing or shortness of breath.   Marland Kitchen amiodarone  (PACERONE) 200 MG tablet Take 1 tablet (200 mg total) by mouth daily. 2 tabs twice daily x5 days. Then 1 tab twice daily x2 weeks. Then one tab daily.   Marland Kitchen apixaban (ELIQUIS) 5 MG TABS tablet Take 1 tablet (5 mg total) by mouth 2 (two) times daily.   Marland Kitchen aspirin (ASPIR-LOW) 81 MG EC tablet Take 81 mg by mouth daily. Swallow whole.   Marland Kitchen atorvastatin (LIPITOR) 40 MG tablet Take 1 tablet (40 mg total) by mouth daily.   . carvedilol (COREG) 25 MG tablet Take 1 tablet (25 mg total) by mouth 2 (two) times daily with a meal.   . furosemide (LASIX) 40 MG tablet Take 2 tablets by mouth every morning and 1 tablet by mouth every afternoon   . gabapentin (NEURONTIN) 100 MG capsule Take 2 capsules (200 mg total) by mouth 3 (three) times daily.   . hydrALAZINE (APRESOLINE) 50 MG tablet Take 1 tablet (50 mg total) by mouth 3 (three) times daily.   . hydrocerin (EUCERIN) CREA Apply to bilateral LEs after cleansing on Mondays and Thursdays (prior to [employer] application).   . metFORMIN (GLUCOPHAGE) 500 MG tablet Take 1 tablet (500 mg total) by mouth 2 (two) times daily with a meal.   . diltiazem (CARDIZEM CD) 120 MG 24 hr capsule Take 2 capsules (240 mg total) by mouth daily. (Patient not taking: Reported on 11/15/2016) 11/15/2016: Not received from pharmacy yet   No facility-administered encounter medications on file as of 11/15/2016.     Functional Status:  In your present state of health, do you have any difficulty performing the following activities: 11/11/2016 10/27/2016  Hearing? N N  Vision? N N  Difficulty concentrating or making decisions? N N  Walking or climbing stairs? Y Y  Dressing or bathing? N N  Doing errands, shopping? Y Y  Preparing Food and eating ? - -  Using the Toilet? - -  In the past six months, have you accidently leaked urine? - -  Do you have problems with loss of bowel control? - -  Managing your Medications? - -  Managing your Finances? - -  Housekeeping or managing your Housekeeping? -  -  Some recent data might be hidden    Fall/Depression Screening:    Fall Risk  11/11/2016 10/19/2016 08/16/2016  Falls in the past year? No No Yes  Number falls in past yr: - - 1  Injury with Fall? - - No  Risk for fall due to : - - History of fall(s);Impaired balance/gait;Impaired mobility  Risk for fall due to (comments): - - -  Follow up - - Education provided;Falls prevention discussed   PHQ 2/9 Scores 11/11/2016 10/19/2016 08/16/2016 11/17/2015 07/23/2015 04/16/2015 12/11/2014  PHQ - 2 Score 0 0 1 1 0 0 0    Assessment:    Met with member at scheduled time.  This care manager inquires about member's complaint of chest pressure/tightness.  She denies radiation to jaw or arm, denies nausea.  She state she feel as if there is something that she need to get up, wincing noted during conversation.  This care manager also inquired about advise member received from 24 hour nurse triage line on Sunday, she report she didn't have transportation to go to the ED, tried to rest for relief.  She report it has gotten better intermittently since the weekend, but never full relief.  She state it is worse when she wake up in the morning.  Call placed to primary MD office to report member's complaints and to inquire about sending refill request to local pharmacy.  Spoke with Charleston Ropes, recommend member come to office for acute visit, member report she does not have transportation but will check to see if her daughter will be able to transport her later today.  Charleston Ropes will request refill for inhaler be sent to [facility] outpatient pharmacy, this care manager will place referral to [place] and call D. Pettus to request emergency financial assistance.  She denies any further concerns at this time.  Has not taken any medications today nor has she weighed herself.  She report that she does still have [facility], has not started telehealth.  She is advised to take medications as soon as possible as well as remember to do daily weights  in effort to manage heart failure.  She is also advised to contact MD office or call 911 if her symptoms get worse.  Plan:   Will await call back from D. Pettus, PharmD, regarding medication concern.  Will provide update to member once call received back. Will follow up with member next week for weekly transition of care call.     Update @ 1600:  Call received from D. Pettus stating that member's medication was paid for and ready for pick up at [place].  Noted that member was able to get her daughter to transport her to the MD office and she is currently there being assessed.  Call placed to clinic, spoke to Selz (and Dr. Lynnae January), made aware  that medication was available, paid for by [employer].  Will proceed with plan to follow up next week.   [facility] CM Care Plan Problem One     Most Recent Value  Care Plan Problem One  Risk for hospital admission related to heart failure as evidenced by recent hospitalization  Role Documenting the Problem One  Care [facility] [place] for Problem One  Active  [facility] Long Term Goal   Member will not be readmitted to hospital within the next 31 days of discharge  [facility] Long Term Goal Start Date  11/03/16  Interventions for Problem One Long Term Goal  Discussed with member the importance of following discharge instructions, including follow up appointments, medications, diet, and compliance with home health involvement, to decrease the risk of readmission  [facility] CM Short Term Goal #1   Member will take medications as prescribed for the next 4 weeks  [facility] CM Short Term Goal #1 Start Date  11/03/16  Interventions for Short Term Goal #1  Educated on the importance of taking medications as prescribed.  Medication list reviewed as new medications were prescribed.  [facility] CM Short Term Goal #2   Member will keep and attend follow up appointment within the next 2 weeks  [facility] CM Short Term Goal #2 Start Date  11/03/16  [facility] CM Short Term Goal #2 Met Date   11/15/16  Interventions for Short Term Goal #2  Educated on importance of attending follow up appointment todecrease risk of readmission.  Confirmed member has appointment scheduled, confirmed member has transportation (daughter will take her).  [facility] CM Short Term Goal #3  Member will weigh self and record readings daily over the next 4 weeks  [facility] CM Short Term Goal #3 Start Date  11/03/16 [Goal not met, date reset]  Interventions for Short Tern Goal #3  Confirmed that member has working scale in the home, educated on the importance of daily weights and monitioring fluid status.     Valente David, [place], MSN [place] [phone]

## 2016-11-15 NOTE — Telephone Encounter (Signed)
Agree with [facility] appt. Thanks!  

## 2016-11-15 NOTE — Progress Notes (Signed)
   CC: Shortness of breath  HPI:  [name] is a 76 y.o. here after an episode of chest tightness and dyspnea this morning that she was unable to treat due to running out of her albuterol inhaler.   See problem based assessment and plan below for additional details  Past Medical History:  Diagnosis Date  . Atrial flutter with rapid ventricular response (HCC) 07/11/2016  . CAD (coronary artery disease) 05/04/2011  . Diabetes mellitus without complication (HCC)   . Hyperlipidemia   . Hypertension   . Lumbar disc disease   . Morbid Obesity 05/02/2011  . Non-ST elevation myocardial infarction (NSTEMI), initial episode of care [facility](HCC) 05/02/2011    Review of Systems:  Review of Systems  Constitutional: Negative for diaphoresis.  Respiratory: Positive for shortness of breath. Negative for wheezing.   Cardiovascular: Positive for leg swelling.  Gastrointestinal: Positive for abdominal pain and constipation. Negative for nausea.    Physical Exam: Physical Exam  Constitutional: She is well-developed, well-nourished, and in no distress.  Neck: No JVD present.  Cardiovascular: Normal rate and regular rhythm.   3/6 systolic murmur loudest over RUSB  Pulmonary/Chest: Effort normal and breath sounds normal.  Abdominal: There is no tenderness.  Musculoskeletal: She exhibits edema.  3+ pitting bilateral edema, with circumferential venous statis changes and well healed ulcer    Vitals:   11/15/16 1513  BP: (!) 153/45  Pulse: 62  Temp: 98.6 F (37 C)  TempSrc: Oral  SpO2: 100%  Weight: (!) 316 lb (143.3 kg)  Height: 5\' 5"  (1.651 m)    Assessment & Plan:   See Encounters Tab for problem based charting.  Patient discussed with Dr. Rogelia BogaButcher

## 2016-11-16 DIAGNOSIS — Z7982 Long term (current) use of aspirin: Secondary | ICD-10-CM | POA: Diagnosis not present

## 2016-11-16 DIAGNOSIS — I4892 Unspecified atrial flutter: Secondary | ICD-10-CM | POA: Diagnosis not present

## 2016-11-16 DIAGNOSIS — I5032 Chronic diastolic (congestive) heart failure: Secondary | ICD-10-CM | POA: Diagnosis not present

## 2016-11-16 DIAGNOSIS — Z87891 Personal history of nicotine dependence: Secondary | ICD-10-CM | POA: Diagnosis not present

## 2016-11-16 DIAGNOSIS — N182 Chronic kidney disease, stage 2 (mild): Secondary | ICD-10-CM | POA: Diagnosis not present

## 2016-11-16 DIAGNOSIS — Z7901 Long term (current) use of anticoagulants: Secondary | ICD-10-CM | POA: Diagnosis not present

## 2016-11-16 DIAGNOSIS — I2510 Atherosclerotic heart disease of native coronary artery without angina pectoris: Secondary | ICD-10-CM | POA: Diagnosis not present

## 2016-11-16 DIAGNOSIS — I130 Hypertensive heart and chronic kidney disease with heart failure and stage 1 through stage 4 chronic kidney disease, or unspecified chronic kidney disease: Secondary | ICD-10-CM | POA: Diagnosis not present

## 2016-11-16 DIAGNOSIS — E1122 Type 2 diabetes mellitus with diabetic chronic kidney disease: Secondary | ICD-10-CM | POA: Diagnosis not present

## 2016-11-16 NOTE — Assessment & Plan Note (Signed)
Shortness of breath this morning occurred while at rest. It was tightness located at the center of her chest which is the usual sensation for her dyspnea. This improved spontaneously as described above.  This seems consistent with her chronic dyspnea. At recent hospitalization similar symptoms were present but with recent cardiology workup in January testing was not repeated since this was not felt to represent unstable angina.  Her albuterol inhaler refill was send to outpatient pharmacy to fill this at no cost in the same day. We will need to resume this medicine long term through her mail order pharmacy for long term treatment.

## 2016-11-16 NOTE — Progress Notes (Signed)
Internal Medicine Clinic Attending  Case discussed with Dr. Rice at the time of the visit.  We reviewed the resident's history and exam and pertinent patient test results.  I agree with the assessment, diagnosis, and plan of care documented in the resident's note.  

## 2016-11-16 NOTE — Telephone Encounter (Signed)
Seen on 11/11/2016 for HFU.Criss AlvineGoldston, Ily Denno Cassady6/6/20189:10 AM

## 2016-11-16 NOTE — Assessment & Plan Note (Signed)
HPI: She had shortness of breath today starting around 6am but previously felt normal. She did not have her home albuterol treatment for this. Symptoms improved spontaneously by the time of this clinic visit. Her weight is 316 today which is not much increased from hospital discharge but still above prior baseline and about 5lbs from last clinic visit. She has persistent 3+ pitting edema in the legs with clear lungs. SCr was down to 1.5 from 1.7 at last visit on reduced lasix dose.   A: I do not think her dyspnea today was due to heart failure given the acute onset, spontaneous improvement, and clear exam. She could benefit from resuming her previous higher lasix dose for edema and now that she is back to probably baseline renal function.  P: Increase lasix to 80mg  PO BID F/U in about 2 wks for recheck

## 2016-11-16 NOTE — Assessment & Plan Note (Signed)
She feels like some of her chest discomfort improves after passing gas and bowel movements. She has only 1 BM per 2-3 days, without significant straining or any blood.  Recommend starting miralax once daily until daily stools, then can reduce to PRN with goal of 1/day

## 2016-11-17 ENCOUNTER — Telehealth: Payer: Self-pay | Admitting: *Deleted

## 2016-11-17 ENCOUNTER — Other Ambulatory Visit: Payer: Self-pay | Admitting: Pharmacist

## 2016-11-17 NOTE — Patient Outreach (Signed)
[facility]) Care Management  [facility] CM Pharmacy   11/17/2016  Jamie Mason Aug 26, 1940 [id number]  Subjective:  Patient was referred to [facility] by [facility] RN [facility] for patient cost concerns with albuterol inhaler.   Per review of notes in chart and patient report, she obtained an albuterol inhaler following her PCP appointment this week.  Patient reports she hopes she will be able to afford her inhalers from her mail order pharmacy for next month.    Patient reports she thinks she completed Energy manager with pharmacist in internal medicine clinic some time in April or May 2018.  Patient was able to review her medication with [facility] Pharmacist.  She reports she does not use pill box because she does not like pill boxes.  Patient asked about amlodipine and diltiazem.    Patient was admitted at hospital 5/17-5/22/18.    Her past medical history is significant for hypertension, CAD, atrial flutter, type 2 diabetes mellitus with chronic kidney disease, hyperlipidemia.    Objective:   Current Medications: Current Outpatient Prescriptions  Medication Sig Dispense Refill  . albuterol (PROVENTIL HFA;VENTOLIN HFA) 108 (90 Base) MCG/ACT inhaler Inhale 2 puffs into the lungs every 6 (six) hours as needed for wheezing or shortness of breath. 1 Inhaler 0  . amiodarone (PACERONE) 200 MG tablet Take 1 tablet (200 mg total) by mouth daily. 2 tabs twice daily x5 days. Then 1 tab twice daily x2 weeks. Then one tab daily. 30 tablet 2  . apixaban (ELIQUIS) 5 MG TABS tablet Take 1 tablet (5 mg total) by mouth 2 (two) times daily. 180 tablet 3  . aspirin (ASPIR-LOW) 81 MG EC tablet Take 81 mg by mouth daily. Swallow whole.    Marland Kitchen atorvastatin (LIPITOR) 40 MG tablet Take 1 tablet (40 mg total) by mouth daily. 90 tablet 3  . carvedilol (COREG) 25 MG tablet Take 1 tablet (25 mg total) by mouth 2 (two) times daily with a meal. 90 tablet 3  . diltiazem  (CARDIZEM CD) 120 MG 24 hr capsule Take 2 capsules (240 mg total) by mouth daily. (Patient not taking: Reported on 11/15/2016) 30 capsule 2  . furosemide (LASIX) 40 MG tablet Take 1 tablet (40 mg total) by mouth 2 (two) times daily. Take 2 tablets by mouth every morning and 1 tablet by mouth every afternoon 120 tablet 2  . gabapentin (NEURONTIN) 100 MG capsule Take 2 capsules (200 mg total) by mouth 3 (three) times daily. 180 capsule 2  . hydrALAZINE (APRESOLINE) 50 MG tablet Take 1 tablet (50 mg total) by mouth 3 (three) times daily. 90 tablet 2  . hydrocerin (EUCERIN) CREA Apply to bilateral LEs after cleansing on Mondays and Thursdays (prior to [employer] application). 113 g 0  . metFORMIN (GLUCOPHAGE) 500 MG tablet Take 1 tablet (500 mg total) by mouth 2 (two) times daily with a meal. 180 tablet 3   No current facility-administered medications for this visit.     Functional Status: In your present state of health, do you have any difficulty performing the following activities: 11/15/2016 11/11/2016  Hearing? N N  Vision? N N  Difficulty concentrating or making decisions? N N  Walking or climbing stairs? Y Y  Dressing or bathing? N N  Doing errands, shopping? Y Y  Preparing Food and eating ? - -  Using the Toilet? - -  In the past six months, have you accidently leaked urine? - -  Do you  have problems with loss of bowel control? - -  Managing your Medications? - -  Managing your Finances? - -  Housekeeping or managing your Housekeeping? - -  Some recent data might be hidden    Fall/Depression Screening: Fall Risk  11/15/2016 11/11/2016 10/19/2016  Falls in the past year? No No No  Number falls in past yr: - - -  Injury with Fall? - - -  Risk for fall due to : - - -  Risk for fall due to (comments): - - -  Follow up - - -   PHQ 2/9 Scores 11/15/2016 11/11/2016 10/19/2016 08/16/2016 11/17/2015 07/23/2015 04/16/2015  PHQ - 2 Score 0 0 0 1 1 0 0    Assessment:  Medication review per patient report  and medication list in this chart.   Patient was recently discharged from hospital and all medications have been reviewed.  Drugs sorted by system:  Cardiovascular: -amiodarone---patient reports taking one tablet daily -apixaban (eliquis)---patient reports only taking one tab daily---counseled patient her medication list and discharge summary note dose as one tablet twice daily.  -aspirin -atorvastatin -carvedilol -diltiazem---patient reports she just started---noted review of 6/1/8 office note indicates dose was 120 mg once daily, patient was given 120 mg two capsules daily -furosemide---patient reported 40 mg tabs---two tablets twice daily---this dose discrepancy was clarified per chart review from Dr Hyman Hopes Rice on 11/17/16.   -hydralazine  Pulmonary/Allergy: -albuterol inhaler  Endocrine: -metformin  Topical: -eucerin  Miscellaneous: -gabapentin  Other issues noted:  Did not notice indication in chart to show apixaban dose was changed---it is typically dosed twice daily.  Please ensure patient continues to take apixaban appropriately---she was counseled on direction on her medication list and verbalized understanding.   Patient counseling: Patient counseled per 11/11/16 note from Dr Peggyann Juba'Sullivan, amlodipine was discontinued and diltiazem was started.  She verbalized understanding.   Medication assistance: Based on patient report, income may be under requirement for [facility] LPSA Extra Help.  Her income would also be under requirement for [employer] Patient Assistance Program, albuterol inhaler, but she likely needs to apply for [employer] Extra Help first.     Plan:  1) Will route note to PCP and request PCP clarify apixaban and diltiazem doses.   2) Will follow-up with Amada KingfisherJen Kim, PharmD in internal medication clinic.   3) Will place follow-up call to patient in the next 2 weeks.    Tommye StandardKevin Sony Schlarb, PharmD, Clark Memorial HospitalBCACP Clinical Pharmacist [facility] [phone]

## 2016-11-17 NOTE — Telephone Encounter (Signed)
[facility] - need clarifications on 2 med written on 6/5. Furosemide rx had 2 different directions - 1 tab 2 times daily , and 2 tabs every morning and 1 tab every afternoon. Miralax take 255g by mouth once. Please clarify and [facility]. thanks

## 2016-11-17 NOTE — Telephone Encounter (Signed)
[facility] Miralax take 17g or 1 cap full by mouth daily dispense 1 bottle 255g 0 refills. Furosemide take 2 tablets by mouth every morning and 2 tablets by mouth every afternoon 40mg  tablets #120 x 2 refills.

## 2016-11-18 ENCOUNTER — Encounter: Payer: Self-pay | Admitting: Pharmacist

## 2016-11-18 NOTE — Progress Notes (Signed)
Successful Tier Exception on Eliquis from Tier 3 to Tier 1 ($0 copay). [facility] for patient.

## 2016-11-21 ENCOUNTER — Telehealth: Payer: Self-pay | Admitting: Pharmacist

## 2016-11-21 ENCOUNTER — Other Ambulatory Visit: Payer: Self-pay | Admitting: *Deleted

## 2016-11-21 NOTE — Patient Outreach (Signed)
[facility]) Care Management  11/21/2016  Jamie NighGloria C Mason 05/24/1941 [id number]   Weekly transition of care call placed to member.  She report that she is doing much better than she was during last week's visit.  She is [facility], state this has made a big difference.  She denies having to use every day, but report relief when having chest pressure/tightness and shortness of breath.  She state she remains active with [facility] for [facility] Health nursing and PT.  She denies starting telehealth, but report she has been weighing herself.  She has not done this on a consistent basis, has not weighted today or yesterday.  She is re-educated on heart failure management, advised again of the importance of daily weights.    Re-educated on appropriate heart failure diet and low sodium foods.  She state that her daughter has started to buy her more low salt foods, but she does not like them.  Complications of high sodium diet discussed, including readmission to hospital.  She verbalizes understanding, stating she will "keep trying."  She report compliance with all medications, including increase in Lasix and Miralax.  Confirms her follow up appointment with primary MD within the next 2 weeks.  Will follow up next week.  Kemper DurieMonica Kizzy Olafson, [place]RN, MSN [facility] Care Management  [facility] Care Manager [phone]

## 2016-11-22 ENCOUNTER — Telehealth: Payer: Self-pay

## 2016-11-22 NOTE — Telephone Encounter (Signed)
Jamie Mason with [facility] requesting VO. Please call back.  

## 2016-11-23 NOTE — Telephone Encounter (Signed)
Lm for rtc 

## 2016-11-23 NOTE — Telephone Encounter (Signed)
Tried calling patient to follow up with medication help. Unable to reach.

## 2016-11-24 DIAGNOSIS — I5032 Chronic diastolic (congestive) heart failure: Secondary | ICD-10-CM | POA: Diagnosis not present

## 2016-11-24 DIAGNOSIS — Z7982 Long term (current) use of aspirin: Secondary | ICD-10-CM | POA: Diagnosis not present

## 2016-11-24 DIAGNOSIS — E1122 Type 2 diabetes mellitus with diabetic chronic kidney disease: Secondary | ICD-10-CM | POA: Diagnosis not present

## 2016-11-24 DIAGNOSIS — Z87891 Personal history of nicotine dependence: Secondary | ICD-10-CM | POA: Diagnosis not present

## 2016-11-24 DIAGNOSIS — I4892 Unspecified atrial flutter: Secondary | ICD-10-CM | POA: Diagnosis not present

## 2016-11-24 DIAGNOSIS — I130 Hypertensive heart and chronic kidney disease with heart failure and stage 1 through stage 4 chronic kidney disease, or unspecified chronic kidney disease: Secondary | ICD-10-CM | POA: Diagnosis not present

## 2016-11-24 DIAGNOSIS — Z7901 Long term (current) use of anticoagulants: Secondary | ICD-10-CM | POA: Diagnosis not present

## 2016-11-24 DIAGNOSIS — I2510 Atherosclerotic heart disease of native coronary artery without angina pectoris: Secondary | ICD-10-CM | POA: Diagnosis not present

## 2016-11-24 DIAGNOSIS — N182 Chronic kidney disease, stage 2 (mild): Secondary | ICD-10-CM | POA: Diagnosis not present

## 2016-11-24 NOTE — Telephone Encounter (Signed)
Lm for rtc 

## 2016-11-28 DIAGNOSIS — I2510 Atherosclerotic heart disease of native coronary artery without angina pectoris: Secondary | ICD-10-CM | POA: Diagnosis not present

## 2016-11-28 DIAGNOSIS — Z7901 Long term (current) use of anticoagulants: Secondary | ICD-10-CM | POA: Diagnosis not present

## 2016-11-28 DIAGNOSIS — I130 Hypertensive heart and chronic kidney disease with heart failure and stage 1 through stage 4 chronic kidney disease, or unspecified chronic kidney disease: Secondary | ICD-10-CM | POA: Diagnosis not present

## 2016-11-28 DIAGNOSIS — N182 Chronic kidney disease, stage 2 (mild): Secondary | ICD-10-CM | POA: Diagnosis not present

## 2016-11-28 DIAGNOSIS — I4892 Unspecified atrial flutter: Secondary | ICD-10-CM | POA: Diagnosis not present

## 2016-11-28 DIAGNOSIS — E1122 Type 2 diabetes mellitus with diabetic chronic kidney disease: Secondary | ICD-10-CM | POA: Diagnosis not present

## 2016-11-28 DIAGNOSIS — I5032 Chronic diastolic (congestive) heart failure: Secondary | ICD-10-CM | POA: Diagnosis not present

## 2016-11-28 DIAGNOSIS — Z7982 Long term (current) use of aspirin: Secondary | ICD-10-CM | POA: Diagnosis not present

## 2016-11-28 DIAGNOSIS — Z87891 Personal history of nicotine dependence: Secondary | ICD-10-CM | POA: Diagnosis not present

## 2016-11-29 ENCOUNTER — Other Ambulatory Visit: Payer: Self-pay | Admitting: *Deleted

## 2016-11-29 NOTE — Patient Outreach (Signed)
[facility]) Care Management  11/29/2016  Jamie NighGloria C Mason 07/10/1940 [id number]   Covering for Kemper DurieMonica Lane, RN for ongoing transition of care  RN spoke with pt today and indicated the purpose for today's call in covering the above RN case manager. Inquired on pt's ongoing management of care as pt indicated she does not weight daily and her [facility] scale has not been set up. Pt states she had a visit with the nurse but does not remember the name of the agency or the nurse. States she does not have internet for the telemonitoring system and that maybe why so does not have the set up. Pt denies any swelling other then her normal with no signs or symptoms of HF. RN explain the risk involved with no monitoring and how importance the HF zone are concerning her daily management of care with her HF. Explained the GREEN zone and encouraged the pt to contact her provider if she gains 3 lbs overnight or 5 lbs over one week with any precipatating symptoms described in the YELLOW and RED zones such as severe SOB not relieved with rest or congestion resulting in SOB fluid retention with ongoing swelling. Explained how the heart is overworked with fluid retention and possible outcomes as a result. Pt indicated she would start weighing. RN also verified pt is on a low sodium diet and watching her sodium intake to prevent ongoing swelling. Pt states recent CAD visit and will not have to follow up for 6 months.   RN made pt aware that her CM [facility]([name], [place]RN) would follow up next week with ongoing transition of care call. Pt very grateful with no additional inquires or request.  Elliot CousinLisa Jordie Skalsky, RN Care Management Coordinator [facility] Main Office [phone]

## 2016-11-30 ENCOUNTER — Other Ambulatory Visit: Payer: Self-pay | Admitting: Internal Medicine

## 2016-11-30 ENCOUNTER — Other Ambulatory Visit: Payer: Self-pay | Admitting: Pharmacist

## 2016-11-30 NOTE — Patient Outreach (Signed)
[facility]) Care Management  11/30/2016  DATRA CLARY 1940/12/27 [id number]  Successful phone outreach to patient.  Updated patient per review of chart, appears [facility] clinic has completed a Tier Exception request for patient Eliquis---patient confirmed she was able to afford Eliquis.    Patient reports she received a denial letter from [facility] Extra Help due to income exceeding requirements.  She was counseled to keep this letter in a safe place in case she needs to prove she was denied [facility] Extra Help.   Discussed manufacturer patient assistance for [employer] with Proventil HFA.    Patient is interested in applying to see if she is eligible for assistance with albuterol inhaler from manufacturer.   She has not met out-of-pocket prescription spend requirement to apply to [place] for Ventolin per her report.   Plan:  Will contact Mannie Stabile, PharmD in PCP clinic to see if she is able to assist patient with application during patient's appointment with PCP tomorrow.   Patient is aware application will be mailed to her for completion if [facility] Pharmacist is not able to reach PharmD in PCP clinic.     Spoke with Mannie Stabile, PharmD in clinic and will send her application to have patient fill out tomorrow.   Will continue to follow-up with patient.    Karrie Meres, PharmD, [place] [phone]

## 2016-12-01 ENCOUNTER — Ambulatory Visit: Payer: Medicare HMO | Admitting: Pharmacist

## 2016-12-01 ENCOUNTER — Ambulatory Visit (INDEPENDENT_AMBULATORY_CARE_PROVIDER_SITE_OTHER): Payer: Medicare HMO | Admitting: Internal Medicine

## 2016-12-01 DIAGNOSIS — Z7901 Long term (current) use of anticoagulants: Secondary | ICD-10-CM | POA: Diagnosis not present

## 2016-12-01 DIAGNOSIS — I2510 Atherosclerotic heart disease of native coronary artery without angina pectoris: Secondary | ICD-10-CM

## 2016-12-01 DIAGNOSIS — N182 Chronic kidney disease, stage 2 (mild): Secondary | ICD-10-CM

## 2016-12-01 DIAGNOSIS — E669 Obesity, unspecified: Secondary | ICD-10-CM | POA: Diagnosis not present

## 2016-12-01 DIAGNOSIS — E1122 Type 2 diabetes mellitus with diabetic chronic kidney disease: Secondary | ICD-10-CM | POA: Diagnosis not present

## 2016-12-01 DIAGNOSIS — Z79899 Other long term (current) drug therapy: Secondary | ICD-10-CM | POA: Diagnosis not present

## 2016-12-01 DIAGNOSIS — I5032 Chronic diastolic (congestive) heart failure: Secondary | ICD-10-CM

## 2016-12-01 DIAGNOSIS — Z87891 Personal history of nicotine dependence: Secondary | ICD-10-CM

## 2016-12-01 DIAGNOSIS — I130 Hypertensive heart and chronic kidney disease with heart failure and stage 1 through stage 4 chronic kidney disease, or unspecified chronic kidney disease: Secondary | ICD-10-CM

## 2016-12-01 DIAGNOSIS — I25118 Atherosclerotic heart disease of native coronary artery with other forms of angina pectoris: Secondary | ICD-10-CM

## 2016-12-01 DIAGNOSIS — Z7984 Long term (current) use of oral hypoglycemic drugs: Secondary | ICD-10-CM | POA: Diagnosis not present

## 2016-12-01 DIAGNOSIS — I4892 Unspecified atrial flutter: Secondary | ICD-10-CM

## 2016-12-01 DIAGNOSIS — B0229 Other postherpetic nervous system involvement: Secondary | ICD-10-CM | POA: Diagnosis not present

## 2016-12-01 DIAGNOSIS — Z7982 Long term (current) use of aspirin: Secondary | ICD-10-CM

## 2016-12-01 DIAGNOSIS — I10 Essential (primary) hypertension: Secondary | ICD-10-CM

## 2016-12-01 DIAGNOSIS — Z0000 Encounter for general adult medical examination without abnormal findings: Secondary | ICD-10-CM

## 2016-12-01 NOTE — Patient Instructions (Signed)
1. Return in 2-3 weeks for blood pressure check

## 2016-12-02 NOTE — Progress Notes (Signed)
   Subjective:    Patient ID: Jamie Mason, female    DOB: 10/29/1940, 76 y.o.   MRN: [id number]  HPI  Jamie Mason is here for HF F/U. Please see the A&P for the status of the pt's chronic medical problems.  ROS : per ROS section and in problem oriented charting. All other systems are negative.  PMHx, Soc hx, and / or Fam hx : Had no recent falls. She is getting home physical therapy to strengthen her legs. They are coming twice a week. She got a hospital bed when she came out of rehabilitation but she says the springs are poking through and it is very uncomfortable. She plans on replacing it herself.  Review of Systems  Respiratory: Negative for shortness of breath.        + DOE  Cardiovascular: Negative for chest pain and palpitations.  Gastrointestinal: Negative for blood in stool.  Genitourinary: Positive for frequency and urgency.  Musculoskeletal: Positive for arthralgias, back pain and gait problem.       Objective:   Physical Exam  Constitutional: She appears well-developed and well-nourished. No distress.  Obese  HENT:  Head: Normocephalic and atraumatic.  Right Ear: External ear normal.  Left Ear: External ear normal.  Nose: Nose normal.  Eyes: Conjunctivae and EOM are normal.  Cardiovascular: Normal rate and regular rhythm.   Murmur heard. Musculoskeletal:  + Nonpitting edema lower extremities bilaterally with taught skin  Skin: Skin is warm and dry. She is not diaphoretic.  Psychiatric: She has a normal mood and affect. Her behavior is normal. Thought content normal.          Assessment & Plan:

## 2016-12-02 NOTE — Assessment & Plan Note (Signed)
This problem is chronic and stable. She is now in sinus rhythm after cardioversion and sees Dr. Eden EmmsNishan. She is on Eliquis, Coreg, and amiodarone. She had her TSH checked in April of this year. She has not noticed any palpitations.  PLAN:  Cont current meds

## 2016-12-02 NOTE — Assessment & Plan Note (Signed)
This problem is chronic and stable. She follows with cardiology. She is on a beta blocker, aspirin, and a statin - atorvastatin 40, a high intensity statin. She has no side effects to these medications.  PLAN:  Cont current meds

## 2016-12-02 NOTE — Assessment & Plan Note (Signed)
This problem is chronic and stable. At her last appointment, I had increased her gabapentin to 200 3 times a day. 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 The neck pain is gone but she still has fingertip numbness.    PLAN : Cont med will continue to follow

## 2016-12-02 NOTE — Assessment & Plan Note (Signed)
I again offered colon cancer screening that she does not want to undergo normal. I offered because she was recently treated with MiraLAX for constipation and is still requiring this medication. She is taking the medication 3 times a day for the past 2 days. Her hemoglobin was 11.5 last month. She is also a mammogram in the recent past. I will need to offer DEXA at her next appointment.

## 2016-12-02 NOTE — Assessment & Plan Note (Signed)
This problem is chronic and stable. Her A1c trend has been 6.8 - 7.3 - 6.8. She is on metformin 500 twice a day and has no side effects to this medication. She is up-to-date on all of her diabetes metrics.  PLAN:  Cont current meds

## 2016-12-02 NOTE — Assessment & Plan Note (Signed)
This problem is chronic and uncontrolled. Her regimen is Coreg 25 twice a day, Lasix 80 twice a day, hydralazine 50 3 times a day, and diltiazem 120. She had been switched from amlodipine to diltiazem at her last appointment but only made the switch yesterday. 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 Her blood pressure today does not represent her controlled on diltiazem. She states that her blood pressure was 128/44 at her home but we do not have the cuff to calibrate it. I am going to continue the current regimen and ask that she return in 2 weeks to recheck her blood pressure.  PLAN:  Cont current meds RTC 2 weeks BP check

## 2016-12-02 NOTE — Assessment & Plan Note (Signed)
This problem is chronic and stable. She is on Lasix 80 twice a day. She states that her dyspnea on exertion is a lot better but she still gets some dyspnea. She uses her albuterol no more than 3 times a week on a bad week and sometimes she uses it much less frequently. She has talking scales at home which tells her to write down her weights and she has instructions regarding what to do with her weights.  PLAN:  Cont current meds

## 2016-12-05 ENCOUNTER — Telehealth: Payer: Self-pay

## 2016-12-05 ENCOUNTER — Encounter: Payer: Self-pay | Admitting: Pharmacist

## 2016-12-05 ENCOUNTER — Other Ambulatory Visit: Payer: Self-pay | Admitting: Pharmacist

## 2016-12-05 NOTE — Patient Outreach (Signed)
[facility]) Care Management  12/05/2016  Jamie NighGloria C Mason 06/12/1941 [id number]  Successful phone outreach to patient.  HIPAA details verified.  Patient reports she did not complete [employer] Patient Assistance application at PCP appointment last week. She reports she was not given application either.    Patient denies other questions or concerns about her medications at this time, aside from cost concern with albuterol inhaler.  She reports she does not use a pill box at home and does not wish to use one.   Plan:  Address in chart verified with patient---will send her [employer] Patient Assistance program application for albuterol inhaler.   Will get in touch to Amada KingfisherJen Kim, PharmD in PCP clinic to see if she can assist with getting prescriber portion completed.   Tommye StandardKevin Gerrit Rafalski, PharmD, [facility] Clinical Pharmacist [facility] [phone]

## 2016-12-05 NOTE — Telephone Encounter (Signed)
Info sent to pt's pcp.  No further action needed, phone call complete.Kingsley SpittleGoldston, Darlene Cassady6/25/20183:40 PM

## 2016-12-05 NOTE — Telephone Encounter (Signed)
Joni ReiningNicole with [facility] want to informed the doctor/nurse, she had a missed visit with PT last week due to her request.

## 2016-12-06 ENCOUNTER — Encounter: Payer: Self-pay | Admitting: Pharmacist

## 2016-12-06 DIAGNOSIS — I4892 Unspecified atrial flutter: Secondary | ICD-10-CM | POA: Diagnosis not present

## 2016-12-06 DIAGNOSIS — Z87891 Personal history of nicotine dependence: Secondary | ICD-10-CM | POA: Diagnosis not present

## 2016-12-06 DIAGNOSIS — I2510 Atherosclerotic heart disease of native coronary artery without angina pectoris: Secondary | ICD-10-CM | POA: Diagnosis not present

## 2016-12-06 DIAGNOSIS — I130 Hypertensive heart and chronic kidney disease with heart failure and stage 1 through stage 4 chronic kidney disease, or unspecified chronic kidney disease: Secondary | ICD-10-CM | POA: Diagnosis not present

## 2016-12-06 DIAGNOSIS — Z7901 Long term (current) use of anticoagulants: Secondary | ICD-10-CM | POA: Diagnosis not present

## 2016-12-06 DIAGNOSIS — I5032 Chronic diastolic (congestive) heart failure: Secondary | ICD-10-CM | POA: Diagnosis not present

## 2016-12-06 DIAGNOSIS — Z7982 Long term (current) use of aspirin: Secondary | ICD-10-CM | POA: Diagnosis not present

## 2016-12-06 DIAGNOSIS — E1122 Type 2 diabetes mellitus with diabetic chronic kidney disease: Secondary | ICD-10-CM | POA: Diagnosis not present

## 2016-12-06 DIAGNOSIS — N182 Chronic kidney disease, stage 2 (mild): Secondary | ICD-10-CM | POA: Diagnosis not present

## 2016-12-06 NOTE — Progress Notes (Signed)
Jamie Mason is a 76 y.o. female who was reviewed for apixaban (Eliquis) therapy.    ASSESSMENT  Indication(s): atrial flutter CHA2DS2VASC score 6, HAS-BLED 3 Duration: indefinite  Labs: Component Value Date/Time   AST 21 09/12/2016 0525   ALT 13 (L) 09/12/2016 0525   NA 143 11/11/2016 1359   K 4.4 11/11/2016 1359   CL 101 11/11/2016 1359   CO2 27 11/11/2016 1359   GLUCOSE 154 (H) 11/11/2016 1359   GLUCOSE 138 (H) 11/01/2016 0540   HGBA1C 6.8 10/06/2016 0845   HGBA1C 7.3 (H) 06/27/2016 0509   BUN 25 11/11/2016 1359   CREATININE 1.53 (H) 11/11/2016 1359   CREATININE 0.96 07/09/2014 1429   CALCIUM 9.5 11/11/2016 1359   GFRAA 38 (L) 11/11/2016 1359   GFRAA 69 07/09/2014 1429   WBC 11.5 (H) 10/27/2016 1137   HGB 11.5 (L) 10/27/2016 1137   HGB 10.3 (L) 08/19/2016 1539   HCT 36.1 10/27/2016 1137   HCT 33.2 (L) 08/19/2016 1539   PLT 353 10/27/2016 1137   PLT 431 (H) 08/19/2016 1539   apixaban (Eliquis) Dose: 5 mg BID  Safety: Patient reports no recent signs or symptoms of bleeding, no signs of symptoms of thromboembolism. Medication changes: no.  Renal/hepatic/drug interaction concerns: yes, CKD  Adherence: Patient does correctly recite the dose. Patient reports no known adherence challenges. Pharmacy records indicate refills are consistent.   Patient Instructions: Patient advised to contact clinic or seek medical attention if signs/symptoms of bleeding or thromboembolism occur. Patient verbalized understanding by repeating back information.  Follow-up Recommended labs to consider: CBC and renal function 6-12 months. Next appointment 12/15/16  Marzetta BoardJennifer Deaundre [place] Clinical Pharmacist  12/06/2016, 1:46 PM

## 2016-12-07 ENCOUNTER — Other Ambulatory Visit: Payer: Self-pay | Admitting: *Deleted

## 2016-12-07 DIAGNOSIS — Z7901 Long term (current) use of anticoagulants: Secondary | ICD-10-CM | POA: Diagnosis not present

## 2016-12-07 DIAGNOSIS — I5032 Chronic diastolic (congestive) heart failure: Secondary | ICD-10-CM | POA: Diagnosis not present

## 2016-12-07 DIAGNOSIS — I2510 Atherosclerotic heart disease of native coronary artery without angina pectoris: Secondary | ICD-10-CM | POA: Diagnosis not present

## 2016-12-07 DIAGNOSIS — E1122 Type 2 diabetes mellitus with diabetic chronic kidney disease: Secondary | ICD-10-CM | POA: Diagnosis not present

## 2016-12-07 DIAGNOSIS — I4892 Unspecified atrial flutter: Secondary | ICD-10-CM | POA: Diagnosis not present

## 2016-12-07 DIAGNOSIS — I130 Hypertensive heart and chronic kidney disease with heart failure and stage 1 through stage 4 chronic kidney disease, or unspecified chronic kidney disease: Secondary | ICD-10-CM | POA: Diagnosis not present

## 2016-12-07 DIAGNOSIS — Z87891 Personal history of nicotine dependence: Secondary | ICD-10-CM | POA: Diagnosis not present

## 2016-12-07 DIAGNOSIS — Z7982 Long term (current) use of aspirin: Secondary | ICD-10-CM | POA: Diagnosis not present

## 2016-12-07 DIAGNOSIS — N182 Chronic kidney disease, stage 2 (mild): Secondary | ICD-10-CM | POA: Diagnosis not present

## 2016-12-07 NOTE — Patient Outreach (Signed)
[facility]) Care Management  12/07/2016  [name] 01/19/1941 [id number]   Weekly transition of care call placed to member, no answer.  Unable to leave voice message.  Will await call back, if no call back, will follow up next week.  Kemper DurieMonica Russel Morain, [place]RN, MSN [facility] Care Management  [facility] Care Manager [phone]

## 2016-12-08 DIAGNOSIS — I4892 Unspecified atrial flutter: Secondary | ICD-10-CM | POA: Diagnosis not present

## 2016-12-08 DIAGNOSIS — I130 Hypertensive heart and chronic kidney disease with heart failure and stage 1 through stage 4 chronic kidney disease, or unspecified chronic kidney disease: Secondary | ICD-10-CM | POA: Diagnosis not present

## 2016-12-08 DIAGNOSIS — E1122 Type 2 diabetes mellitus with diabetic chronic kidney disease: Secondary | ICD-10-CM | POA: Diagnosis not present

## 2016-12-08 DIAGNOSIS — Z7982 Long term (current) use of aspirin: Secondary | ICD-10-CM | POA: Diagnosis not present

## 2016-12-08 DIAGNOSIS — Z87891 Personal history of nicotine dependence: Secondary | ICD-10-CM | POA: Diagnosis not present

## 2016-12-08 DIAGNOSIS — I5032 Chronic diastolic (congestive) heart failure: Secondary | ICD-10-CM | POA: Diagnosis not present

## 2016-12-08 DIAGNOSIS — N182 Chronic kidney disease, stage 2 (mild): Secondary | ICD-10-CM | POA: Diagnosis not present

## 2016-12-08 DIAGNOSIS — Z7901 Long term (current) use of anticoagulants: Secondary | ICD-10-CM | POA: Diagnosis not present

## 2016-12-08 DIAGNOSIS — I2510 Atherosclerotic heart disease of native coronary artery without angina pectoris: Secondary | ICD-10-CM | POA: Diagnosis not present

## 2016-12-13 ENCOUNTER — Other Ambulatory Visit: Payer: Self-pay | Admitting: *Deleted

## 2016-12-13 DIAGNOSIS — L8990 Pressure ulcer of unspecified site, unspecified stage: Secondary | ICD-10-CM | POA: Diagnosis not present

## 2016-12-13 DIAGNOSIS — J9620 Acute and chronic respiratory failure, unspecified whether with hypoxia or hypercapnia: Secondary | ICD-10-CM | POA: Diagnosis not present

## 2016-12-13 DIAGNOSIS — I4892 Unspecified atrial flutter: Secondary | ICD-10-CM | POA: Diagnosis not present

## 2016-12-13 DIAGNOSIS — Z87891 Personal history of nicotine dependence: Secondary | ICD-10-CM | POA: Diagnosis not present

## 2016-12-13 DIAGNOSIS — I2510 Atherosclerotic heart disease of native coronary artery without angina pectoris: Secondary | ICD-10-CM | POA: Diagnosis not present

## 2016-12-13 DIAGNOSIS — Z7982 Long term (current) use of aspirin: Secondary | ICD-10-CM | POA: Diagnosis not present

## 2016-12-13 DIAGNOSIS — Z7901 Long term (current) use of anticoagulants: Secondary | ICD-10-CM | POA: Diagnosis not present

## 2016-12-13 DIAGNOSIS — I5032 Chronic diastolic (congestive) heart failure: Secondary | ICD-10-CM | POA: Diagnosis not present

## 2016-12-13 DIAGNOSIS — E1122 Type 2 diabetes mellitus with diabetic chronic kidney disease: Secondary | ICD-10-CM | POA: Diagnosis not present

## 2016-12-13 DIAGNOSIS — N182 Chronic kidney disease, stage 2 (mild): Secondary | ICD-10-CM | POA: Diagnosis not present

## 2016-12-13 DIAGNOSIS — I130 Hypertensive heart and chronic kidney disease with heart failure and stage 1 through stage 4 chronic kidney disease, or unspecified chronic kidney disease: Secondary | ICD-10-CM | POA: Diagnosis not present

## 2016-12-13 NOTE — Patient Outreach (Signed)
[facility]) Care Management  12/13/2016  [name] 04/09/1941 [id number]   Call placed to member to confirm home visit for this morning. She state she has a lot to do this morning and would like to reschedule for next week.  She denies any urgent concerns at this time, state she is "doing fine."  Home visit rescheduled for next week.  Kemper DurieMonica Marilyne Haseley, [place]RN, MSN [facility] Care Management  [facility]Community Care Manager [phone]

## 2016-12-14 DIAGNOSIS — N182 Chronic kidney disease, stage 2 (mild): Secondary | ICD-10-CM | POA: Diagnosis not present

## 2016-12-14 DIAGNOSIS — Z7901 Long term (current) use of anticoagulants: Secondary | ICD-10-CM | POA: Diagnosis not present

## 2016-12-14 DIAGNOSIS — Z7982 Long term (current) use of aspirin: Secondary | ICD-10-CM | POA: Diagnosis not present

## 2016-12-14 DIAGNOSIS — Z87891 Personal history of nicotine dependence: Secondary | ICD-10-CM | POA: Diagnosis not present

## 2016-12-14 DIAGNOSIS — I2510 Atherosclerotic heart disease of native coronary artery without angina pectoris: Secondary | ICD-10-CM | POA: Diagnosis not present

## 2016-12-14 DIAGNOSIS — I4892 Unspecified atrial flutter: Secondary | ICD-10-CM | POA: Diagnosis not present

## 2016-12-14 DIAGNOSIS — I5032 Chronic diastolic (congestive) heart failure: Secondary | ICD-10-CM | POA: Diagnosis not present

## 2016-12-14 DIAGNOSIS — E1122 Type 2 diabetes mellitus with diabetic chronic kidney disease: Secondary | ICD-10-CM | POA: Diagnosis not present

## 2016-12-14 DIAGNOSIS — I130 Hypertensive heart and chronic kidney disease with heart failure and stage 1 through stage 4 chronic kidney disease, or unspecified chronic kidney disease: Secondary | ICD-10-CM | POA: Diagnosis not present

## 2016-12-15 ENCOUNTER — Ambulatory Visit: Payer: Medicare HMO | Admitting: Pharmacist

## 2016-12-15 ENCOUNTER — Ambulatory Visit: Payer: Medicare HMO

## 2016-12-16 ENCOUNTER — Other Ambulatory Visit: Payer: Self-pay | Admitting: Internal Medicine

## 2016-12-16 ENCOUNTER — Encounter: Payer: Self-pay | Admitting: Pharmacist

## 2016-12-16 ENCOUNTER — Other Ambulatory Visit: Payer: Self-pay | Admitting: Pharmacist

## 2016-12-16 DIAGNOSIS — I5032 Chronic diastolic (congestive) heart failure: Secondary | ICD-10-CM | POA: Diagnosis not present

## 2016-12-16 DIAGNOSIS — E1122 Type 2 diabetes mellitus with diabetic chronic kidney disease: Secondary | ICD-10-CM | POA: Diagnosis not present

## 2016-12-16 DIAGNOSIS — I2510 Atherosclerotic heart disease of native coronary artery without angina pectoris: Secondary | ICD-10-CM | POA: Diagnosis not present

## 2016-12-16 DIAGNOSIS — Z7982 Long term (current) use of aspirin: Secondary | ICD-10-CM | POA: Diagnosis not present

## 2016-12-16 DIAGNOSIS — N182 Chronic kidney disease, stage 2 (mild): Secondary | ICD-10-CM | POA: Diagnosis not present

## 2016-12-16 DIAGNOSIS — Z7901 Long term (current) use of anticoagulants: Secondary | ICD-10-CM | POA: Diagnosis not present

## 2016-12-16 DIAGNOSIS — I4892 Unspecified atrial flutter: Secondary | ICD-10-CM | POA: Diagnosis not present

## 2016-12-16 DIAGNOSIS — Z87891 Personal history of nicotine dependence: Secondary | ICD-10-CM | POA: Diagnosis not present

## 2016-12-16 DIAGNOSIS — I130 Hypertensive heart and chronic kidney disease with heart failure and stage 1 through stage 4 chronic kidney disease, or unspecified chronic kidney disease: Secondary | ICD-10-CM | POA: Diagnosis not present

## 2016-12-16 NOTE — Patient Outreach (Signed)
[facility]) Care Management  12/16/2016  Jamie NighGloria C Mason 03/17/1941 [id number]  Successful phone follow-up to patient.  She reports she did not receive [employer] application for patient assistance for albuterol inhaler yet.   Verified with patient address listed in chart is correct, and she states it is.   She asked if she could [facility] her albuterol inhaler and what the cost would be--she was counseled she can obtain a refill with a valid prescription from the pharmacy of her choice and she would need to pay the co-pay associated with the medication set by her insurance.  Plan:  Will mail patient another application for [employer] patient assistance.   Will continue to follow-up with patient regarding patient assistance.   Tommye StandardKevin Rosi Secrist, PharmD, [facility] HospitalBCACP Clinical Pharmacist [facility] [phone]

## 2016-12-20 ENCOUNTER — Other Ambulatory Visit: Payer: Self-pay | Admitting: Internal Medicine

## 2016-12-20 DIAGNOSIS — E1122 Type 2 diabetes mellitus with diabetic chronic kidney disease: Secondary | ICD-10-CM | POA: Diagnosis not present

## 2016-12-20 DIAGNOSIS — R0609 Other forms of dyspnea: Principal | ICD-10-CM

## 2016-12-20 DIAGNOSIS — I2510 Atherosclerotic heart disease of native coronary artery without angina pectoris: Secondary | ICD-10-CM | POA: Diagnosis not present

## 2016-12-20 DIAGNOSIS — Z7982 Long term (current) use of aspirin: Secondary | ICD-10-CM | POA: Diagnosis not present

## 2016-12-20 DIAGNOSIS — I4892 Unspecified atrial flutter: Secondary | ICD-10-CM | POA: Diagnosis not present

## 2016-12-20 DIAGNOSIS — Z87891 Personal history of nicotine dependence: Secondary | ICD-10-CM | POA: Diagnosis not present

## 2016-12-20 DIAGNOSIS — Z7901 Long term (current) use of anticoagulants: Secondary | ICD-10-CM | POA: Diagnosis not present

## 2016-12-20 DIAGNOSIS — I5032 Chronic diastolic (congestive) heart failure: Secondary | ICD-10-CM | POA: Diagnosis not present

## 2016-12-20 DIAGNOSIS — I130 Hypertensive heart and chronic kidney disease with heart failure and stage 1 through stage 4 chronic kidney disease, or unspecified chronic kidney disease: Secondary | ICD-10-CM | POA: Diagnosis not present

## 2016-12-20 DIAGNOSIS — N182 Chronic kidney disease, stage 2 (mild): Secondary | ICD-10-CM | POA: Diagnosis not present

## 2016-12-20 NOTE — Telephone Encounter (Signed)
I reviewed scripts and notes. Started dr Peggyann Jubao'sullivan 6/1. Note said 120 but script said 120 2 qd and only rx'd 30. My note said 120. Will refill 120. Has appt in aug.

## 2016-12-20 NOTE — Telephone Encounter (Signed)
PATIENT NEEDS REFILL SENT FOR INHALER TO [facility]

## 2016-12-21 ENCOUNTER — Other Ambulatory Visit: Payer: Self-pay | Admitting: Internal Medicine

## 2016-12-21 DIAGNOSIS — I4892 Unspecified atrial flutter: Secondary | ICD-10-CM | POA: Diagnosis not present

## 2016-12-21 DIAGNOSIS — Z7901 Long term (current) use of anticoagulants: Secondary | ICD-10-CM | POA: Diagnosis not present

## 2016-12-21 DIAGNOSIS — Z87891 Personal history of nicotine dependence: Secondary | ICD-10-CM | POA: Diagnosis not present

## 2016-12-21 DIAGNOSIS — Z7982 Long term (current) use of aspirin: Secondary | ICD-10-CM | POA: Diagnosis not present

## 2016-12-21 DIAGNOSIS — N182 Chronic kidney disease, stage 2 (mild): Secondary | ICD-10-CM | POA: Diagnosis not present

## 2016-12-21 DIAGNOSIS — R0609 Other forms of dyspnea: Principal | ICD-10-CM

## 2016-12-21 DIAGNOSIS — I5032 Chronic diastolic (congestive) heart failure: Secondary | ICD-10-CM | POA: Diagnosis not present

## 2016-12-21 DIAGNOSIS — E1122 Type 2 diabetes mellitus with diabetic chronic kidney disease: Secondary | ICD-10-CM | POA: Diagnosis not present

## 2016-12-21 DIAGNOSIS — I130 Hypertensive heart and chronic kidney disease with heart failure and stage 1 through stage 4 chronic kidney disease, or unspecified chronic kidney disease: Secondary | ICD-10-CM | POA: Diagnosis not present

## 2016-12-21 DIAGNOSIS — I2510 Atherosclerotic heart disease of native coronary artery without angina pectoris: Secondary | ICD-10-CM | POA: Diagnosis not present

## 2016-12-21 NOTE — Telephone Encounter (Signed)
Thanks! Triage - would you pls ask her to come get sample?

## 2016-12-21 NOTE — Telephone Encounter (Signed)
I [facility] her [facility]. Is she really going through inhalers that quickly? IF yes, pls tell her she needs an [facility] appt for eval.   Dr Selena BattenKim - [facility] since [facility]?

## 2016-12-21 NOTE — Telephone Encounter (Signed)
I worked with [facility] to send in a patient assistance application for albuterol, but it may take a few more weeks for approval. We could provide her a sample [facility]erol [facility]. I believe she is ok on other meds due to approval for low income assistance.

## 2016-12-22 ENCOUNTER — Other Ambulatory Visit: Payer: Self-pay | Admitting: *Deleted

## 2016-12-22 ENCOUNTER — Other Ambulatory Visit: Payer: Self-pay | Admitting: Internal Medicine

## 2016-12-22 DIAGNOSIS — Z7982 Long term (current) use of aspirin: Secondary | ICD-10-CM | POA: Diagnosis not present

## 2016-12-22 DIAGNOSIS — E1122 Type 2 diabetes mellitus with diabetic chronic kidney disease: Secondary | ICD-10-CM | POA: Diagnosis not present

## 2016-12-22 DIAGNOSIS — I4892 Unspecified atrial flutter: Secondary | ICD-10-CM | POA: Diagnosis not present

## 2016-12-22 DIAGNOSIS — Z7901 Long term (current) use of anticoagulants: Secondary | ICD-10-CM | POA: Diagnosis not present

## 2016-12-22 DIAGNOSIS — I130 Hypertensive heart and chronic kidney disease with heart failure and stage 1 through stage 4 chronic kidney disease, or unspecified chronic kidney disease: Secondary | ICD-10-CM | POA: Diagnosis not present

## 2016-12-22 DIAGNOSIS — I2510 Atherosclerotic heart disease of native coronary artery without angina pectoris: Secondary | ICD-10-CM | POA: Diagnosis not present

## 2016-12-22 DIAGNOSIS — N182 Chronic kidney disease, stage 2 (mild): Secondary | ICD-10-CM | POA: Diagnosis not present

## 2016-12-22 DIAGNOSIS — Z87891 Personal history of nicotine dependence: Secondary | ICD-10-CM | POA: Diagnosis not present

## 2016-12-22 DIAGNOSIS — I5032 Chronic diastolic (congestive) heart failure: Secondary | ICD-10-CM | POA: Diagnosis not present

## 2016-12-22 NOTE — Telephone Encounter (Signed)
done

## 2016-12-22 NOTE — Patient Outreach (Signed)
[facility]) Care Management   12/22/2016  Jamie Mason Oct 05, 1940 [id number]  Jamie Mason is an 76 y.o. female  Subjective:   Member alert and oriented x3.  She denies pain, but does complain of chest pressure and shortness of breath.  She report she has been compliant with medications, ran out of her inhaler yesterday.  She state she has been trying to get a refill from the [facility] outpatient pharmacy, however they don't have any refills, and has requested it from primary MD.  Objective:   Review of Systems  Constitutional: Negative.   HENT: Negative.   Eyes: Negative.   Respiratory: Positive for shortness of breath.   Cardiovascular: Positive for leg swelling.  Gastrointestinal: Negative.   Genitourinary: Negative.   Musculoskeletal: Negative.   Skin: Negative.   Neurological: Negative.   Endo/Heme/Allergies: Negative.   Psychiatric/Behavioral: Negative.     Physical Exam  Constitutional: She is oriented to person, place, and time. She appears well-developed and well-nourished.  Neck: Normal range of motion.  Cardiovascular: Normal rate, regular rhythm and normal heart sounds.   Respiratory: Effort normal and breath sounds normal.  GI: Soft. Bowel sounds are normal.  Musculoskeletal: Normal range of motion.  Neurological: She is alert and oriented to person, place, and time.   BP (!) 158/60 (BP Location: Left Arm, Patient Position: Sitting, Cuff Size: Normal)   Pulse 68   Resp 20   Ht 1.651 m ('5\' 5"' )   Wt (!) 323 lb (146.5 kg)   SpO2 96%   BMI 53.75 kg/m   Encounter Medications:   Outpatient Encounter Prescriptions as of 12/22/2016  Medication Sig Note  . albuterol (PROVENTIL HFA;VENTOLIN HFA) 108 (90 Base) MCG/ACT inhaler Inhale 2 puffs into the lungs every 6 (six) hours as needed for wheezing or shortness of breath.   Mar[place] Kitchen amiodarone (PACERONE) 200 MG tablet Take 1 tablet (200 mg total) by mouth daily. 2 tabs twice daily x5 days. Then 1  tab twice daily x2 weeks. Then one tab daily.   Mar[place] Kitchen apixaban (ELIQUIS) 5 MG TABS tablet Take 1 tablet (5 mg total) by mouth 2 (two) times daily. 11/18/2016: Patient reports only taking once daily.    Marland Kitchen aspirin (ASPIR-LOW) 81 MG EC tablet Take 81 mg by mouth daily. Swallow whole.   Marland Kitchen atorvastatin (LIPITOR) 40 MG tablet Take 1 tablet (40 mg total) by mouth daily.   . carvedilol (COREG) 25 MG tablet Take 1 tablet (25 mg total) by mouth 2 (two) times daily with a meal.   . diltiazem (CARTIA XT) 120 MG 24 hr capsule Take 1 capsule (120 mg total) by mouth daily.   . furosemide (LASIX) 40 MG tablet Take 1 tablet (40 mg total) by mouth 2 (two) times daily. Take 2 tablets by mouth every morning and 1 tablet by mouth every afternoon 11/18/2016: Patient reports taking 40 mg tabs---two tabs twice daily.    Marland Kitchen gabapentin (NEURONTIN) 100 MG capsule Take 2 capsules (200 mg total) by mouth 3 (three) times daily.   . hydrALAZINE (APRESOLINE) 50 MG tablet Take 1 tablet (50 mg total) by mouth 3 (three) times daily.   . hydrocerin (EUCERIN) CREA Apply to bilateral LEs after cleansing on Mondays and Thursdays (prior to [employer] application).   . metFORMIN (GLUCOPHAGE) 500 MG tablet Take 1 tablet (500 mg total) by mouth 2 (two) times daily with a meal.   . polyethylene glycol powder (GLYCOLAX/MIRALAX) powder MIX 1 CAPFUL (17GM) IN LIQUID AND  DRINK EVERY DAY    No facility-administered encounter medications on file as of 12/22/2016.     Functional Status:   In your present state of health, do you have any difficulty performing the following activities: 12/01/2016 11/15/2016  Hearing? N N  Vision? N N  Difficulty concentrating or making decisions? N N  Walking or climbing stairs? Y Y  Dressing or bathing? N N  Doing errands, shopping? Y Y  Preparing Food and eating ? - -  Using the Toilet? - -  In the past six months, have you accidently leaked urine? - -  Do you have problems with loss of bowel control? - -  Managing  your Medications? - -  Managing your Finances? - -  Housekeeping or managing your Housekeeping? - -  Some recent data might be hidden    Fall/Depression Screening:    Fall Risk  12/01/2016 11/15/2016 11/11/2016  Falls in the past year? No No No  Number falls in past yr: - - -  Injury with Fall? - - -  Risk Factor Category  High Fall Risk - -  Risk for fall due to : Impaired balance/gait - -  Risk for fall due to (comments): - - -  Follow up Falls prevention discussed - -   PHQ 2/9 Scores 12/01/2016 11/15/2016 11/11/2016 10/19/2016 08/16/2016 11/17/2015 07/23/2015  PHQ - 2 Score 0 0 0 0 1 1 0    Assessment:    Met with member at scheduled time.  She immediately begins to express frustration regarding Albuterol refill.  She state she is still unable to obtain it from [facility] mail order pharmacy due to balance.  She is aware that once refill is sent to [facility] pharmacy, she will be responsible for copay.  She verbalizes understanding.  She state she is still waiting on application for pharmacy program.    Call placed to primary MD office, spoke to H. Buena Irish.  She state request was sent to MD, however they are able to provide a sample from the office today.  She report that member will need to be evaluated due to increase use of inhaler.  Member unable to get to appointment today as daughter is in class, but does accept appointment for tomorrow.  Member made aware that plan will need to be in place for future refill needs.    Member has not consistently done daily weights.  She does weigh today during home visit, 323 pounds, which is up 12 pounds from last visit.  She is short of breath when walking just a few feet from living room to bathroom, lung sounds clear but diminished.  Discussed heart failure management as her legs are more swollen today then last visit.  She state she has been advised to abide by a daily fluid restriction, does not always comply.  Re-educated on appropriate heart failure diet, including  fluid restriction, low sodium foods and medication management.  She denies any other urgent concerns, advised to contact with questions.  Plan:   Will follow up next week regarding office visit with primary MD.  Will schedule home visit for next month at that time. Will collaborate with [facility] pharmacist to have plan in place for refill of inhaler.   [facility] CM Care Plan Problem One     Most Recent Value  Care Plan Problem One  Risk for hospital admission related to heart failure as evidenced by recent hospitalization  Role Documenting the Problem One  Care Management Coordinator  Care Plan for Problem One  Not Active  [facility] Long Term Goal   Member will not be readmitted to hospital within the next 31 days of discharge  [facility] Long Term Goal Start Date  11/03/16  [facility] Long Term Goal Met Date  12/22/16  Interventions for Problem One Long Term Goal  Discussed with member the importance of following discharge instructions, including follow up appointments, medications, diet, and compliance with home health involvement, to decrease the risk of readmission  [facility] CM Short Term Goal #1   Member will take medications as prescribed for the next 4 weeks  [facility] CM Short Term Goal #1 Start Date  11/03/16  [facility] CM Short Term Goal #1 Met Date  11/21/16  Interventions for Short Term Goal #1  Educated on the importance of taking medications as prescribed.  Medication list reviewed as new medications were prescribed.  [facility] CM Short Term Goal #2   Member will keep and attend follow up appointment within the next 2 weeks  [facility] CM Short Term Goal #2 Start Date  11/03/16  [facility] CM Short Term Goal #2 Met Date  11/15/16  Interventions for Short Term Goal #2  Educated on importance of attending follow up appointment todecrease risk of readmission.  Confirmed member has appointment scheduled, confirmed member has transportation (daughter will take her).  [facility] CM Short Term Goal #3  Member will weigh self and record readings daily over  the next 4 weeks  [facility] CM Short Term Goal #3 Start Date  11/03/16 [Goal not met, date reset]  [facility] CM Short Term Goal #3 Met Date  -- [Goal not met]  Interventions for Short Tern Goal #3  Confirmed that member has working scale in the home, educated on the importance of daily weights and monitioring fluid status.     Valente David, [place], MSN [place] [phone]

## 2016-12-22 NOTE — Telephone Encounter (Signed)
See RF request from 7/10

## 2016-12-23 ENCOUNTER — Other Ambulatory Visit: Payer: Self-pay | Admitting: Internal Medicine

## 2016-12-23 ENCOUNTER — Ambulatory Visit (INDEPENDENT_AMBULATORY_CARE_PROVIDER_SITE_OTHER): Payer: Medicare HMO | Admitting: Internal Medicine

## 2016-12-23 VITALS — BP 167/63 | HR 63 | Temp 98.6°F | Ht 65.0 in | Wt 324.1 lb

## 2016-12-23 DIAGNOSIS — I2510 Atherosclerotic heart disease of native coronary artery without angina pectoris: Secondary | ICD-10-CM | POA: Diagnosis not present

## 2016-12-23 DIAGNOSIS — R0609 Other forms of dyspnea: Secondary | ICD-10-CM | POA: Diagnosis not present

## 2016-12-23 DIAGNOSIS — Z79899 Other long term (current) drug therapy: Secondary | ICD-10-CM

## 2016-12-23 DIAGNOSIS — Z6841 Body Mass Index (BMI) 40.0 and over, adult: Secondary | ICD-10-CM

## 2016-12-23 DIAGNOSIS — E1121 Type 2 diabetes mellitus with diabetic nephropathy: Secondary | ICD-10-CM | POA: Diagnosis not present

## 2016-12-23 DIAGNOSIS — Z823 Family history of stroke: Secondary | ICD-10-CM | POA: Diagnosis not present

## 2016-12-23 DIAGNOSIS — Z87891 Personal history of nicotine dependence: Secondary | ICD-10-CM

## 2016-12-23 DIAGNOSIS — I10 Essential (primary) hypertension: Secondary | ICD-10-CM | POA: Diagnosis not present

## 2016-12-23 DIAGNOSIS — E785 Hyperlipidemia, unspecified: Secondary | ICD-10-CM | POA: Diagnosis not present

## 2016-12-23 MED ORDER — HYDRALAZINE HCL 50 MG PO TABS: 50.0000 mg | ORAL_TABLET | Freq: Three times a day (TID) | ORAL | 2 refills | Status: DC

## 2016-12-23 MED ORDER — HYDRALAZINE HCL 100 MG PO TABS: 100.0000 mg | ORAL_TABLET | Freq: Three times a day (TID) | ORAL | 0 refills | Status: DC

## 2016-12-23 MED ORDER — ALBUTEROL SULFATE HFA 108 (90 BASE) MCG/ACT IN AERS: 2.0000 | INHALATION_SPRAY | Freq: Four times a day (QID) | RESPIRATORY_TRACT | 3 refills | Status: DC | PRN

## 2016-12-23 NOTE — Assessment & Plan Note (Signed)
The patient expressed interest in loosing weight at today's visit and asked many questions about dietary changes which could be made toward this goal. She set the goal of decreasing the amount of soda she drinks during the day and increasing the amount of water she drinks. She also set the goal of eating less snacks like potato chips and eating more fruit. She was congratulated on her willingness to change and encouraged to begin these efforts towards weight loss. She was also encouraged to continue active participation in PT during the day. The goals can be reassessed at follow up with PCP in August.

## 2016-12-23 NOTE — Patient Instructions (Addendum)
Thank you for seeing us today!  Please take all your blood pressure medications each day. You should start taking them in the mornings.  Please call the clinic with any questions.  Please return to the clinic in 3-4 weeks for your scheduled visit with your PCP Dr. Rogelia BogaButcher.

## 2016-12-23 NOTE — Progress Notes (Signed)
CC: HTN follow up  HPI:  Jamie Mason is a 76 y.o. with PMH of atrial flutter, CAD, T2DM< HLD, HTN and morbid obesity who presents today for follow up of her elevated BP. Per chart review the patient was in clinic last week for management of her chronic medical conditions. She was switched to diltiazem 120 mg daily prior to that visit but had not taken this medication. She was scheduled to return for a BP recheck to make sure this medication works for her. The patient describes being able to take her medications as previously prescribed on most days. She was able to obtain new medication but doesn't always take it at the same time every day. She states that she usually takes all her once a day medications in the evening and would like to start taking them in the morning. She takes her BID and TID medication as prescribed, but has difficulty taking the morning dose if she wakes up late on some days. On the days she wakes up late she takes it as soon as she can. She reluctantly reports that she did not take any of her medications today prior to her clinic visit.  The patient is requesting a refill on her inhaler and is confused why she couldn't get a refill of her inhaler through her pharmacy. She says she uses her inhaler when she "wheezes" and gets out of breath while moving around. She states that she has to use it mainly in the mornings. She sometimes uses it up to 2 times per day, but also states she can go 2-3 days without use of the inhaler. She says that the inhaler helps and that she would like to use it to help her participate in physical therapy during the day.  Patient states that she would like to loose weight and asks if eating a diet filled with fruits can help with loosing weight. Patient reports drinking sodas during the day and restricting fluids to 32 ounces per day.  Past Medical History:  Diagnosis Date  . Atrial flutter with rapid ventricular response (HCC) 07/11/2016  . CAD  (coronary artery disease) 05/04/2011  . Diabetes mellitus without complication (HCC)   . Hyperlipidemia   . Hypertension   . Lumbar disc disease   . Morbid Obesity 05/02/2011  . Non-ST elevation myocardial infarction (NSTEMI), initial episode of care [facility]) 05/02/2011   Review of Systems:   Patient endorses shortness of breath and leg swelling. Patient denies chest pain, nausea, vomiting, abdominal pain, headaches, changes in vision, and recent change in bowel/bladder habits.  Physical Exam:  Vitals:   12/23/16 1558  BP: (!) 167/63  Pulse: 63  Temp: 98.6 F (37 C)  TempSrc: Oral  SpO2: 96%  Weight: (!) 324 lb 1.6 oz (147 kg)  Height: 5\' 5"  (1.651 m)   Physical Exam  Cardiovascular: Normal rate, regular rhythm and intact distal pulses.   No murmur heard. Pulmonary/Chest: Effort normal and breath sounds normal. No respiratory distress. She has no wheezes.  No crackles appreciated. Good air movement throughout all lung fields.  Abdominal: Soft. Bowel sounds are normal. There is no tenderness. There is no guarding.  Musculoskeletal:  Bilateral taught lower extremity skin with 1+ pitting edema to mid shins bilaterally. No overlying skin changes or ulceration.   Skin: Skin is warm and dry. Capillary refill takes less than 2 seconds. No rash noted. No erythema.  Psychiatric: Her behavior is normal. Thought content normal.    Assessment &  Plan:   See Encounters Tab for problem based charting.  Patient seen with Dr. Heide SparkNarendra.

## 2016-12-23 NOTE — Assessment & Plan Note (Signed)
The patient's BP was elevated above goal of 140/90 today. She did admit, however, to not taking any of her medications prior to today's visit. The patient was extensively counseled about the importance of taking her BP medications every day as her PCP prescribed. Because we couldn't accurately assess how she is doing on her recently changed BP regimen (changed at visit with Dr. Rogelia BogaButcher on 12/01/2016) we made no changes to her medications today in spite of BP elevation.   The patient was instructed to take all medications as prescribed. She was told to keep upcoming appointment with Dr. Rogelia BogaButcher and reminded about the importance of taking blood pressure medicine every day prior to and on the day of her next clinic visit. The patient expressed understanding of all information relayed to her.

## 2016-12-23 NOTE — Assessment & Plan Note (Signed)
The patient states that her albuterol inhaler helps her make it through physical therapy sessions and helps keep her out of breath when moving around. She stated that she does not need it all the time, but it sounds like she is using it at least 3 days a week. She was given a refill on her inhaler today and told to follow up with Dr. Rogelia BogaButcher in August regarding this medication.

## 2016-12-23 NOTE — Progress Notes (Deleted)
   CC: ***  HPI:  [name] is a 76 y.o.   Past Medical History:  Diagnosis Date  . Atrial flutter with rapid ventricular response (HCC) 07/11/2016  . CAD (coronary artery disease) 05/04/2011  . Diabetes mellitus without complication (HCC)   . Hyperlipidemia   . Hypertension   . Lumbar disc disease   . Morbid Obesity 05/02/2011  . Non-ST elevation myocardial infarction (NSTEMI), initial episode of care (HCC) 05/02/2011   Review of Systems:  ***  Physical Exam:  Vitals:   12/23/16 1558  BP: (!) 167/63  Pulse: 63  Temp: 98.6 F (37 C)  TempSrc: Oral  SpO2: 96%  Weight: (!) 324 lb 1.6 oz (147 kg)  Height: 5\' 5"  (1.651 m)   ***  Assessment & Plan:   See Encounters Tab for problem based charting.  Patient {GC/GE:3044014::"discussed with","seen with"} Dr. {NAMES:3044014::"Butcher","Granfortuna","E. Hoffman","Klima","Mullen","Narendra","Raines","Vincent"}

## 2016-12-26 ENCOUNTER — Other Ambulatory Visit: Payer: Self-pay | Admitting: Pharmacist

## 2016-12-26 NOTE — Patient Outreach (Signed)
[facility]) Care Management  12/26/2016  Jamie Mason 11/24/1940 [id number]  1050:  Unsuccessful phone outreach to patient to follow-up and see if she received application for [employer] Patient Assistance in mail yet.  Female answered phone and stated patient won't be back home until this afternoon.   Will attempt to reach patient again later this afternoon.   1349:  Unsuccessful phone outreach to patient.  Female answered and stated patient still not home.   Plan:  Will make another outreach attempt to patient next week.   Tommye StandardKevin Samari Gorby, PharmD, [facility] AugustaBCACP Clinical Pharmacist [facility] [phone]

## 2016-12-27 DIAGNOSIS — E1122 Type 2 diabetes mellitus with diabetic chronic kidney disease: Secondary | ICD-10-CM | POA: Diagnosis not present

## 2016-12-27 DIAGNOSIS — Z7901 Long term (current) use of anticoagulants: Secondary | ICD-10-CM | POA: Diagnosis not present

## 2016-12-27 DIAGNOSIS — I130 Hypertensive heart and chronic kidney disease with heart failure and stage 1 through stage 4 chronic kidney disease, or unspecified chronic kidney disease: Secondary | ICD-10-CM | POA: Diagnosis not present

## 2016-12-27 DIAGNOSIS — Z7982 Long term (current) use of aspirin: Secondary | ICD-10-CM | POA: Diagnosis not present

## 2016-12-27 DIAGNOSIS — Z87891 Personal history of nicotine dependence: Secondary | ICD-10-CM | POA: Diagnosis not present

## 2016-12-27 DIAGNOSIS — I2510 Atherosclerotic heart disease of native coronary artery without angina pectoris: Secondary | ICD-10-CM | POA: Diagnosis not present

## 2016-12-27 DIAGNOSIS — I4892 Unspecified atrial flutter: Secondary | ICD-10-CM | POA: Diagnosis not present

## 2016-12-27 DIAGNOSIS — N182 Chronic kidney disease, stage 2 (mild): Secondary | ICD-10-CM | POA: Diagnosis not present

## 2016-12-27 DIAGNOSIS — I5032 Chronic diastolic (congestive) heart failure: Secondary | ICD-10-CM | POA: Diagnosis not present

## 2016-12-27 NOTE — Progress Notes (Signed)
Internal Medicine Clinic Attending  I saw and evaluated the patient.  I personally confirmed the key portions of the history and exam documented by Dr. Nedrud and I reviewed pertinent patient test results.  The assessment, diagnosis, and plan were formulated together and I agree with the documentation in the resident's note.  

## 2016-12-28 ENCOUNTER — Other Ambulatory Visit: Payer: Self-pay | Admitting: *Deleted

## 2016-12-28 ENCOUNTER — Telehealth: Payer: Self-pay

## 2016-12-28 NOTE — Patient Outreach (Signed)
[facility]) Care Management  12/28/2016  Arvella NighGloria C Joshua 08/17/1940 [id number]   Call placed to follow up on member's visit to the primary MD office last week.  She report that she is "doing good."  She denies any chest pressure or shortness of breath at this time.  She state she has been taking all medication as prescribed, unable to confirm compliance.  Confirms that she does have inhaler, state she [facility] with [facility] to have another delivered after paying a $40 copay.  She initially report daily weights, but then report she has not weighed herself since our home visit last week.  She state the home health nurse discussed concern regarding her legs swelling, report that they are weeping.  She feels that if they are weeping it is a good thing because the fluid is coming out.  Educated again on complications of heart failure and proper management as she has not been compliant with diet and fluid restrictions.  Discussed the potential risk of infection with open areas on her legs.  She verbalizes understanding, report that she will "do better."  She denies any urgent concerns at this time, will follow up within 2 weeks with home visit.  Kemper DurieMonica Juno Alers, [place]RN, MSN [facility] Care Management  [facility] Care Manager [phone]

## 2016-12-28 NOTE — Telephone Encounter (Signed)
Kendra with [facility] home health requesting VO. Please call back.  

## 2016-12-28 NOTE — Telephone Encounter (Signed)
vo ok

## 2016-12-28 NOTE — Telephone Encounter (Signed)
HH PT ask for extension in services for 2x week for 6 weeks, VO given also [facility], need for medicaid and home assistance- given Also states legs are "weeping" and HHN had stated some wheezing in lower lobes Would you like for pt to have appt? Also do you approve of VO's

## 2016-12-29 ENCOUNTER — Telehealth: Payer: Self-pay

## 2016-12-29 ENCOUNTER — Other Ambulatory Visit: Payer: Self-pay | Admitting: *Deleted

## 2016-12-29 DIAGNOSIS — I10 Essential (primary) hypertension: Secondary | ICD-10-CM

## 2016-12-29 MED ORDER — HYDRALAZINE HCL 50 MG PO TABS: 50.0000 mg | ORAL_TABLET | Freq: Three times a day (TID) | ORAL | 2 refills | Status: DC

## 2016-12-29 NOTE — Telephone Encounter (Signed)
[facility]: Clarification needed- hydralazine 100 mg & 50 mg tab were both written on the same date. Pls clarify dose. (last OV 7/13)

## 2016-12-29 NOTE — Telephone Encounter (Signed)
error 

## 2016-12-29 NOTE — Telephone Encounter (Signed)
Per patient her ventolin inhal[facility] because [facility] will [facility]. [facility]olin [facility].

## 2016-12-30 ENCOUNTER — Other Ambulatory Visit: Payer: Self-pay | Admitting: Pharmacist

## 2016-12-30 NOTE — Patient Outreach (Signed)
[facility]) Care Management  12/30/2016  Arvella NighGloria C Taketa 02/28/1941 [id number]  Successful phone outreach to patient.  Patient reports she received [employer] patient assistance application.  She reports she has not filled it out but plans to and will mail it back to [facility] CM Pharmacist.    Discussed with patient once application is received, will send to [employer] Patient Assistance Program for evaluation.   She questions if she will be eligible, and was reminded patient assistance program looks at gross household income and evaluates application on case-by-case basis.    Plan:  Will continue to follow-up patient's application for manufacturer assistance.  Tommye StandardKevin Keanu Lesniak, PharmD, [facility] CenterBCACP Clinical Pharmacist [facility] [phone]

## 2017-01-04 ENCOUNTER — Telehealth: Payer: Self-pay

## 2017-01-04 DIAGNOSIS — I130 Hypertensive heart and chronic kidney disease with heart failure and stage 1 through stage 4 chronic kidney disease, or unspecified chronic kidney disease: Secondary | ICD-10-CM | POA: Diagnosis not present

## 2017-01-04 DIAGNOSIS — I2510 Atherosclerotic heart disease of native coronary artery without angina pectoris: Secondary | ICD-10-CM | POA: Diagnosis not present

## 2017-01-04 DIAGNOSIS — Z7982 Long term (current) use of aspirin: Secondary | ICD-10-CM | POA: Diagnosis not present

## 2017-01-04 DIAGNOSIS — Z87891 Personal history of nicotine dependence: Secondary | ICD-10-CM | POA: Diagnosis not present

## 2017-01-04 DIAGNOSIS — I5032 Chronic diastolic (congestive) heart failure: Secondary | ICD-10-CM | POA: Diagnosis not present

## 2017-01-04 DIAGNOSIS — I4892 Unspecified atrial flutter: Secondary | ICD-10-CM | POA: Diagnosis not present

## 2017-01-04 DIAGNOSIS — N182 Chronic kidney disease, stage 2 (mild): Secondary | ICD-10-CM | POA: Diagnosis not present

## 2017-01-04 DIAGNOSIS — E1122 Type 2 diabetes mellitus with diabetic chronic kidney disease: Secondary | ICD-10-CM | POA: Diagnosis not present

## 2017-01-04 DIAGNOSIS — M6281 Muscle weakness (generalized): Secondary | ICD-10-CM | POA: Diagnosis not present

## 2017-01-04 NOTE — Telephone Encounter (Signed)
Jamie Mason with [facility] requesting VO. Please call back.  

## 2017-01-04 NOTE — Telephone Encounter (Signed)
[facility] for [facility]- 9wks of skilled nursing for medical management.  States pt is confused about what and when to take medications.  Request is for a full 9wks, but it may less time to educate patient about meds.   Verbal order given.  Will forward info to pcp to make her aware-paperwork to follow.Kingsley SpittleGoldston, [place] Rody Cassady7/25/20182:24 PM

## 2017-01-04 NOTE — Telephone Encounter (Signed)
Return call - no answer; left message to give us a call back. 

## 2017-01-05 NOTE — Telephone Encounter (Signed)
Agree. thanks

## 2017-01-06 DIAGNOSIS — I5032 Chronic diastolic (congestive) heart failure: Secondary | ICD-10-CM | POA: Diagnosis not present

## 2017-01-06 DIAGNOSIS — E1122 Type 2 diabetes mellitus with diabetic chronic kidney disease: Secondary | ICD-10-CM | POA: Diagnosis not present

## 2017-01-06 DIAGNOSIS — I130 Hypertensive heart and chronic kidney disease with heart failure and stage 1 through stage 4 chronic kidney disease, or unspecified chronic kidney disease: Secondary | ICD-10-CM | POA: Diagnosis not present

## 2017-01-06 DIAGNOSIS — I4892 Unspecified atrial flutter: Secondary | ICD-10-CM | POA: Diagnosis not present

## 2017-01-06 DIAGNOSIS — I2510 Atherosclerotic heart disease of native coronary artery without angina pectoris: Secondary | ICD-10-CM | POA: Diagnosis not present

## 2017-01-06 DIAGNOSIS — M6281 Muscle weakness (generalized): Secondary | ICD-10-CM | POA: Diagnosis not present

## 2017-01-06 DIAGNOSIS — Z87891 Personal history of nicotine dependence: Secondary | ICD-10-CM | POA: Diagnosis not present

## 2017-01-06 DIAGNOSIS — Z7982 Long term (current) use of aspirin: Secondary | ICD-10-CM | POA: Diagnosis not present

## 2017-01-06 DIAGNOSIS — N182 Chronic kidney disease, stage 2 (mild): Secondary | ICD-10-CM | POA: Diagnosis not present

## 2017-01-11 DIAGNOSIS — I5032 Chronic diastolic (congestive) heart failure: Secondary | ICD-10-CM | POA: Diagnosis not present

## 2017-01-11 DIAGNOSIS — M6281 Muscle weakness (generalized): Secondary | ICD-10-CM | POA: Diagnosis not present

## 2017-01-11 DIAGNOSIS — N182 Chronic kidney disease, stage 2 (mild): Secondary | ICD-10-CM | POA: Diagnosis not present

## 2017-01-11 DIAGNOSIS — Z7982 Long term (current) use of aspirin: Secondary | ICD-10-CM | POA: Diagnosis not present

## 2017-01-11 DIAGNOSIS — I4892 Unspecified atrial flutter: Secondary | ICD-10-CM | POA: Diagnosis not present

## 2017-01-11 DIAGNOSIS — Z87891 Personal history of nicotine dependence: Secondary | ICD-10-CM | POA: Diagnosis not present

## 2017-01-11 DIAGNOSIS — I2510 Atherosclerotic heart disease of native coronary artery without angina pectoris: Secondary | ICD-10-CM | POA: Diagnosis not present

## 2017-01-11 DIAGNOSIS — I130 Hypertensive heart and chronic kidney disease with heart failure and stage 1 through stage 4 chronic kidney disease, or unspecified chronic kidney disease: Secondary | ICD-10-CM | POA: Diagnosis not present

## 2017-01-11 DIAGNOSIS — E1122 Type 2 diabetes mellitus with diabetic chronic kidney disease: Secondary | ICD-10-CM | POA: Diagnosis not present

## 2017-01-12 ENCOUNTER — Ambulatory Visit: Payer: Medicare HMO | Admitting: Internal Medicine

## 2017-01-13 DIAGNOSIS — Z87891 Personal history of nicotine dependence: Secondary | ICD-10-CM | POA: Diagnosis not present

## 2017-01-13 DIAGNOSIS — I4892 Unspecified atrial flutter: Secondary | ICD-10-CM | POA: Diagnosis not present

## 2017-01-13 DIAGNOSIS — I130 Hypertensive heart and chronic kidney disease with heart failure and stage 1 through stage 4 chronic kidney disease, or unspecified chronic kidney disease: Secondary | ICD-10-CM | POA: Diagnosis not present

## 2017-01-13 DIAGNOSIS — N182 Chronic kidney disease, stage 2 (mild): Secondary | ICD-10-CM | POA: Diagnosis not present

## 2017-01-13 DIAGNOSIS — I2510 Atherosclerotic heart disease of native coronary artery without angina pectoris: Secondary | ICD-10-CM | POA: Diagnosis not present

## 2017-01-13 DIAGNOSIS — I5032 Chronic diastolic (congestive) heart failure: Secondary | ICD-10-CM | POA: Diagnosis not present

## 2017-01-13 DIAGNOSIS — Z7982 Long term (current) use of aspirin: Secondary | ICD-10-CM | POA: Diagnosis not present

## 2017-01-13 DIAGNOSIS — M6281 Muscle weakness (generalized): Secondary | ICD-10-CM | POA: Diagnosis not present

## 2017-01-13 DIAGNOSIS — L8990 Pressure ulcer of unspecified site, unspecified stage: Secondary | ICD-10-CM | POA: Diagnosis not present

## 2017-01-13 DIAGNOSIS — J9620 Acute and chronic respiratory failure, unspecified whether with hypoxia or hypercapnia: Secondary | ICD-10-CM | POA: Diagnosis not present

## 2017-01-13 DIAGNOSIS — E1122 Type 2 diabetes mellitus with diabetic chronic kidney disease: Secondary | ICD-10-CM | POA: Diagnosis not present

## 2017-01-16 DIAGNOSIS — N182 Chronic kidney disease, stage 2 (mild): Secondary | ICD-10-CM | POA: Diagnosis not present

## 2017-01-16 DIAGNOSIS — I5032 Chronic diastolic (congestive) heart failure: Secondary | ICD-10-CM | POA: Diagnosis not present

## 2017-01-16 DIAGNOSIS — M6281 Muscle weakness (generalized): Secondary | ICD-10-CM | POA: Diagnosis not present

## 2017-01-16 DIAGNOSIS — Z87891 Personal history of nicotine dependence: Secondary | ICD-10-CM | POA: Diagnosis not present

## 2017-01-16 DIAGNOSIS — E1122 Type 2 diabetes mellitus with diabetic chronic kidney disease: Secondary | ICD-10-CM | POA: Diagnosis not present

## 2017-01-16 DIAGNOSIS — Z7982 Long term (current) use of aspirin: Secondary | ICD-10-CM | POA: Diagnosis not present

## 2017-01-16 DIAGNOSIS — I4892 Unspecified atrial flutter: Secondary | ICD-10-CM | POA: Diagnosis not present

## 2017-01-16 DIAGNOSIS — I2510 Atherosclerotic heart disease of native coronary artery without angina pectoris: Secondary | ICD-10-CM | POA: Diagnosis not present

## 2017-01-16 DIAGNOSIS — I130 Hypertensive heart and chronic kidney disease with heart failure and stage 1 through stage 4 chronic kidney disease, or unspecified chronic kidney disease: Secondary | ICD-10-CM | POA: Diagnosis not present

## 2017-01-17 ENCOUNTER — Other Ambulatory Visit: Payer: Self-pay | Admitting: Pharmacist

## 2017-01-17 DIAGNOSIS — I130 Hypertensive heart and chronic kidney disease with heart failure and stage 1 through stage 4 chronic kidney disease, or unspecified chronic kidney disease: Secondary | ICD-10-CM | POA: Diagnosis not present

## 2017-01-17 DIAGNOSIS — Z7982 Long term (current) use of aspirin: Secondary | ICD-10-CM | POA: Diagnosis not present

## 2017-01-17 DIAGNOSIS — I4892 Unspecified atrial flutter: Secondary | ICD-10-CM | POA: Diagnosis not present

## 2017-01-17 DIAGNOSIS — M6281 Muscle weakness (generalized): Secondary | ICD-10-CM | POA: Diagnosis not present

## 2017-01-17 DIAGNOSIS — I5032 Chronic diastolic (congestive) heart failure: Secondary | ICD-10-CM | POA: Diagnosis not present

## 2017-01-17 DIAGNOSIS — E1122 Type 2 diabetes mellitus with diabetic chronic kidney disease: Secondary | ICD-10-CM | POA: Diagnosis not present

## 2017-01-17 DIAGNOSIS — I2510 Atherosclerotic heart disease of native coronary artery without angina pectoris: Secondary | ICD-10-CM | POA: Diagnosis not present

## 2017-01-17 DIAGNOSIS — N182 Chronic kidney disease, stage 2 (mild): Secondary | ICD-10-CM | POA: Diagnosis not present

## 2017-01-17 DIAGNOSIS — Z87891 Personal history of nicotine dependence: Secondary | ICD-10-CM | POA: Diagnosis not present

## 2017-01-17 NOTE — Patient Outreach (Signed)
[facility]) Care Management  01/17/2017  Arvella NighGloria C Krauter 05/28/1941 [id number]  Successful phone outreach to patient regarding [employer] Patient Assistance application.   Patient reports she has application but has not filled it out yet.  Reminded patient she will need to complete application to see if she is eligible for [employer] patient assistance.   She reports she plans to complete application and mail back to [facility] Pharmacist so application can be sent to [employer] for evaluation.    Plan:  Will make phone outreach to patient in next 10 business days.  If she has not mailed back application will consider case closure.    Tommye StandardKevin Paymon Rosensteel, PharmD, [facility] CenterBCACP Clinical Pharmacist [facility] [phone]

## 2017-01-19 ENCOUNTER — Inpatient Hospital Stay (HOSPITAL_COMMUNITY): Payer: Medicare HMO

## 2017-01-19 ENCOUNTER — Other Ambulatory Visit: Payer: Self-pay | Admitting: *Deleted

## 2017-01-19 ENCOUNTER — Encounter (HOSPITAL_COMMUNITY): Payer: Self-pay | Admitting: Physician Assistant

## 2017-01-19 ENCOUNTER — Telehealth: Payer: Self-pay

## 2017-01-19 ENCOUNTER — Emergency Department (HOSPITAL_COMMUNITY): Payer: Medicare HMO

## 2017-01-19 ENCOUNTER — Telehealth: Payer: Self-pay | Admitting: Internal Medicine

## 2017-01-19 ENCOUNTER — Inpatient Hospital Stay (HOSPITAL_COMMUNITY)
Admission: EM | Admit: 2017-01-19 | Discharge: 2017-01-25 | DRG: 637 | Disposition: A | Discharge status: Home Health | Payer: Medicare HMO | Attending: Internal Medicine | Admitting: Internal Medicine

## 2017-01-19 DIAGNOSIS — E08621 Diabetes mellitus due to underlying condition with foot ulcer: Secondary | ICD-10-CM | POA: Diagnosis not present

## 2017-01-19 DIAGNOSIS — I5033 Acute on chronic diastolic (congestive) heart failure: Secondary | ICD-10-CM | POA: Diagnosis not present

## 2017-01-19 DIAGNOSIS — I2510 Atherosclerotic heart disease of native coronary artery without angina pectoris: Secondary | ICD-10-CM | POA: Diagnosis present

## 2017-01-19 DIAGNOSIS — E11621 Type 2 diabetes mellitus with foot ulcer: Secondary | ICD-10-CM | POA: Diagnosis not present

## 2017-01-19 DIAGNOSIS — L97401 Non-pressure chronic ulcer of unspecified heel and midfoot limited to breakdown of skin: Secondary | ICD-10-CM | POA: Diagnosis not present

## 2017-01-19 DIAGNOSIS — G4733 Obstructive sleep apnea (adult) (pediatric): Secondary | ICD-10-CM | POA: Diagnosis present

## 2017-01-19 DIAGNOSIS — I10 Essential (primary) hypertension: Secondary | ICD-10-CM | POA: Diagnosis present

## 2017-01-19 DIAGNOSIS — Z888 Allergy status to other drugs, medicaments and biological substances status: Secondary | ICD-10-CM | POA: Diagnosis not present

## 2017-01-19 DIAGNOSIS — J441 Chronic obstructive pulmonary disease with (acute) exacerbation: Secondary | ICD-10-CM | POA: Diagnosis present

## 2017-01-19 DIAGNOSIS — E1151 Type 2 diabetes mellitus with diabetic peripheral angiopathy without gangrene: Secondary | ICD-10-CM | POA: Diagnosis present

## 2017-01-19 DIAGNOSIS — L97329 Non-pressure chronic ulcer of left ankle with unspecified severity: Secondary | ICD-10-CM | POA: Diagnosis present

## 2017-01-19 DIAGNOSIS — J9602 Acute respiratory failure with hypercapnia: Secondary | ICD-10-CM | POA: Diagnosis not present

## 2017-01-19 DIAGNOSIS — Z885 Allergy status to narcotic agent status: Secondary | ICD-10-CM | POA: Diagnosis not present

## 2017-01-19 DIAGNOSIS — Z79899 Other long term (current) drug therapy: Secondary | ICD-10-CM

## 2017-01-19 DIAGNOSIS — Z7984 Long term (current) use of oral hypoglycemic drugs: Secondary | ICD-10-CM

## 2017-01-19 DIAGNOSIS — Z87891 Personal history of nicotine dependence: Secondary | ICD-10-CM

## 2017-01-19 DIAGNOSIS — J810 Acute pulmonary edema: Secondary | ICD-10-CM | POA: Diagnosis not present

## 2017-01-19 DIAGNOSIS — S8991XA Unspecified injury of right lower leg, initial encounter: Secondary | ICD-10-CM | POA: Diagnosis not present

## 2017-01-19 DIAGNOSIS — Z6841 Body Mass Index (BMI) 40.0 and over, adult: Secondary | ICD-10-CM

## 2017-01-19 DIAGNOSIS — Z880 Allergy status to penicillin: Secondary | ICD-10-CM

## 2017-01-19 DIAGNOSIS — I4891 Unspecified atrial fibrillation: Secondary | ICD-10-CM | POA: Diagnosis present

## 2017-01-19 DIAGNOSIS — E785 Hyperlipidemia, unspecified: Secondary | ICD-10-CM | POA: Diagnosis present

## 2017-01-19 DIAGNOSIS — R0602 Shortness of breath: Secondary | ICD-10-CM

## 2017-01-19 DIAGNOSIS — N179 Acute kidney failure, unspecified: Secondary | ICD-10-CM | POA: Diagnosis not present

## 2017-01-19 DIAGNOSIS — N183 Chronic kidney disease, stage 3 (moderate): Secondary | ICD-10-CM | POA: Diagnosis present

## 2017-01-19 DIAGNOSIS — I872 Venous insufficiency (chronic) (peripheral): Secondary | ICD-10-CM | POA: Diagnosis present

## 2017-01-19 DIAGNOSIS — E1122 Type 2 diabetes mellitus with diabetic chronic kidney disease: Secondary | ICD-10-CM | POA: Diagnosis present

## 2017-01-19 DIAGNOSIS — I252 Old myocardial infarction: Secondary | ICD-10-CM

## 2017-01-19 DIAGNOSIS — D631 Anemia in chronic kidney disease: Secondary | ICD-10-CM | POA: Diagnosis present

## 2017-01-19 DIAGNOSIS — B871 Wound myiasis: Secondary | ICD-10-CM | POA: Diagnosis present

## 2017-01-19 DIAGNOSIS — E872 Acidosis: Secondary | ICD-10-CM | POA: Diagnosis not present

## 2017-01-19 DIAGNOSIS — E11622 Type 2 diabetes mellitus with other skin ulcer: Secondary | ICD-10-CM | POA: Diagnosis not present

## 2017-01-19 DIAGNOSIS — I484 Atypical atrial flutter: Secondary | ICD-10-CM | POA: Diagnosis not present

## 2017-01-19 DIAGNOSIS — J9601 Acute respiratory failure with hypoxia: Secondary | ICD-10-CM | POA: Diagnosis not present

## 2017-01-19 DIAGNOSIS — L97921 Non-pressure chronic ulcer of unspecified part of left lower leg limited to breakdown of skin: Secondary | ICD-10-CM | POA: Diagnosis not present

## 2017-01-19 DIAGNOSIS — J811 Chronic pulmonary edema: Secondary | ICD-10-CM

## 2017-01-19 DIAGNOSIS — N182 Chronic kidney disease, stage 2 (mild): Secondary | ICD-10-CM

## 2017-01-19 DIAGNOSIS — L97509 Non-pressure chronic ulcer of other part of unspecified foot with unspecified severity: Secondary | ICD-10-CM

## 2017-01-19 DIAGNOSIS — S81839A Puncture wound without foreign body, unspecified lower leg, initial encounter: Secondary | ICD-10-CM | POA: Diagnosis not present

## 2017-01-19 DIAGNOSIS — L97309 Non-pressure chronic ulcer of unspecified ankle with unspecified severity: Secondary | ICD-10-CM

## 2017-01-19 DIAGNOSIS — S81802A Unspecified open wound, left lower leg, initial encounter: Secondary | ICD-10-CM | POA: Diagnosis not present

## 2017-01-19 DIAGNOSIS — Z4682 Encounter for fitting and adjustment of non-vascular catheter: Secondary | ICD-10-CM | POA: Diagnosis not present

## 2017-01-19 DIAGNOSIS — L97529 Non-pressure chronic ulcer of other part of left foot with unspecified severity: Secondary | ICD-10-CM | POA: Diagnosis present

## 2017-01-19 DIAGNOSIS — I25118 Atherosclerotic heart disease of native coronary artery with other forms of angina pectoris: Secondary | ICD-10-CM | POA: Diagnosis not present

## 2017-01-19 DIAGNOSIS — I4892 Unspecified atrial flutter: Secondary | ICD-10-CM | POA: Diagnosis present

## 2017-01-19 DIAGNOSIS — G9340 Encephalopathy, unspecified: Secondary | ICD-10-CM | POA: Diagnosis present

## 2017-01-19 DIAGNOSIS — R0600 Dyspnea, unspecified: Secondary | ICD-10-CM

## 2017-01-19 DIAGNOSIS — J9690 Respiratory failure, unspecified, unspecified whether with hypoxia or hypercapnia: Secondary | ICD-10-CM | POA: Diagnosis not present

## 2017-01-19 DIAGNOSIS — R0603 Acute respiratory distress: Secondary | ICD-10-CM | POA: Diagnosis not present

## 2017-01-19 DIAGNOSIS — Z7901 Long term (current) use of anticoagulants: Secondary | ICD-10-CM

## 2017-01-19 DIAGNOSIS — I130 Hypertensive heart and chronic kidney disease with heart failure and stage 1 through stage 4 chronic kidney disease, or unspecified chronic kidney disease: Secondary | ICD-10-CM | POA: Diagnosis not present

## 2017-01-19 DIAGNOSIS — I483 Typical atrial flutter: Secondary | ICD-10-CM | POA: Diagnosis not present

## 2017-01-19 DIAGNOSIS — I5031 Acute diastolic (congestive) heart failure: Secondary | ICD-10-CM | POA: Diagnosis not present

## 2017-01-19 DIAGNOSIS — I340 Nonrheumatic mitral (valve) insufficiency: Secondary | ICD-10-CM | POA: Diagnosis not present

## 2017-01-19 DIAGNOSIS — L97521 Non-pressure chronic ulcer of other part of left foot limited to breakdown of skin: Secondary | ICD-10-CM | POA: Diagnosis not present

## 2017-01-19 DIAGNOSIS — Z7982 Long term (current) use of aspirin: Secondary | ICD-10-CM

## 2017-01-19 HISTORY — DX: Unspecified diastolic (congestive) heart failure: I50.30

## 2017-01-19 LAB — CBC WITH DIFFERENTIAL/PLATELET
Basophils Absolute: 0 10*3/uL (ref 0.0–0.1)
Basophils Relative: 0 %
EOS PCT: 3 %
Eosinophils Absolute: 0.2 10*3/uL (ref 0.0–0.7)
HEMATOCRIT: 24.7 % — AB (ref 36.0–46.0)
Hemoglobin: 8.2 g/dL — ABNORMAL LOW (ref 12.0–15.0)
LYMPHS ABS: 1.4 10*3/uL (ref 0.7–4.0)
Lymphocytes Relative: 15 %
MCH: 28.6 pg (ref 26.0–34.0)
MCHC: 33.2 g/dL (ref 30.0–36.0)
MCV: 86.1 fL (ref 78.0–100.0)
MONOS PCT: 5 %
Monocytes Absolute: 0.5 10*3/uL (ref 0.1–1.0)
Neutro Abs: 7.4 10*3/uL (ref 1.7–7.7)
Neutrophils Relative %: 77 %
PLATELETS: 332 10*3/uL (ref 150–400)
RBC: 2.87 MIL/uL — ABNORMAL LOW (ref 3.87–5.11)
RDW: 17.1 % — AB (ref 11.5–15.5)
WBC: 9.6 10*3/uL (ref 4.0–10.5)

## 2017-01-19 LAB — BASIC METABOLIC PANEL
ANION GAP: 8 (ref 5–15)
BUN: 31 mg/dL — AB (ref 6–20)
CALCIUM: 9 mg/dL (ref 8.9–10.3)
CO2: 28 mmol/L (ref 22–32)
Chloride: 107 mmol/L (ref 101–111)
Creatinine, Ser: 1.76 mg/dL — ABNORMAL HIGH (ref 0.44–1.00)
GFR calc Af Amer: 32 mL/min — ABNORMAL LOW (ref >60)
GFR, EST NON AFRICAN AMERICAN: 27 mL/min — AB (ref >60)
GLUCOSE: 122 mg/dL — AB (ref 65–99)
Potassium: 3.7 mmol/L (ref 3.5–5.1)
Sodium: 143 mmol/L (ref 135–145)

## 2017-01-19 LAB — URINALYSIS, ROUTINE W REFLEX MICROSCOPIC
BILIRUBIN URINE: NEGATIVE
Glucose, UA: NEGATIVE mg/dL
KETONES UR: NEGATIVE mg/dL
Nitrite: NEGATIVE
PROTEIN: 30 mg/dL — AB
Specific Gravity, Urine: 1.012 (ref 1.005–1.030)
pH: 5 (ref 5.0–8.0)

## 2017-01-19 LAB — BLOOD GAS, ARTERIAL
Acid-base deficit: 0.7 mmol/L (ref 0.0–2.0)
BICARBONATE: 25.5 mmol/L (ref 20.0–28.0)
Drawn by: 11249
FIO2: 100
LHR: 14 {breaths}/min
O2 Saturation: 99.1 %
PATIENT TEMPERATURE: 98.6
PEEP: 5 cmH2O
VT: 510 mL
pCO2 arterial: 53.1 mmHg — ABNORMAL HIGH (ref 32.0–48.0)
pH, Arterial: 7.303 — ABNORMAL LOW (ref 7.350–7.450)
pO2, Arterial: 282 mmHg — ABNORMAL HIGH (ref 83.0–108.0)

## 2017-01-19 LAB — LACTIC ACID, PLASMA: Lactic Acid, Venous: 1.2 mmol/L (ref 0.5–1.9)

## 2017-01-19 LAB — BRAIN NATRIURETIC PEPTIDE: B Natriuretic Peptide: 427.4 pg/mL — ABNORMAL HIGH (ref 0.0–100.0)

## 2017-01-19 LAB — MAGNESIUM: Magnesium: 2 mg/dL (ref 1.7–2.4)

## 2017-01-19 LAB — TROPONIN I

## 2017-01-19 MED ORDER — ALBUTEROL SULFATE (2.5 MG/3ML) 0.083% IN NEBU
2.5000 mg | INHALATION_SOLUTION | RESPIRATORY_TRACT | Status: DC | PRN
  Administered 2017-01-22: 2.5 mg via RESPIRATORY_TRACT
  Filled 2017-01-19: qty 3

## 2017-01-19 MED ORDER — IPRATROPIUM-ALBUTEROL 0.5-2.5 (3) MG/3ML IN SOLN: 3.0000 mL | RESPIRATORY_TRACT | Status: DC | PRN

## 2017-01-19 MED ORDER — CIPROFLOXACIN IN D5W 400 MG/200ML IV SOLN: 400.0000 mg | Freq: Two times a day (BID) | INTRAVENOUS | Status: DC

## 2017-01-19 MED ORDER — FENTANYL CITRATE (PF) 100 MCG/2ML IJ SOLN: 50.0000 ug | Freq: Once | INTRAMUSCULAR | Status: DC

## 2017-01-19 MED ORDER — MIDAZOLAM HCL 2 MG/2ML IJ SOLN
1.0000 mg | INTRAMUSCULAR | Status: DC | PRN
  Administered 2017-01-20: 1 mg via INTRAVENOUS
  Filled 2017-01-19 (×2): qty 2

## 2017-01-19 MED ORDER — VANCOMYCIN HCL IN DEXTROSE 1-5 GM/200ML-% IV SOLN: 1000.0000 mg | Freq: Three times a day (TID) | INTRAVENOUS | Status: DC

## 2017-01-19 MED ORDER — AMIODARONE HCL 200 MG PO TABS
200.0000 mg | ORAL_TABLET | Freq: Every day | ORAL | Status: DC
  Administered 2017-01-20 – 2017-01-21 (×2): 200 mg
  Filled 2017-01-19 (×2): qty 1

## 2017-01-19 MED ORDER — ATORVASTATIN CALCIUM 40 MG PO TABS: 40.0000 mg | ORAL_TABLET | Freq: Every day | ORAL | Status: DC

## 2017-01-19 MED ORDER — DILTIAZEM HCL ER COATED BEADS 120 MG PO CP24: 120.0000 mg | ORAL_CAPSULE | Freq: Every day | ORAL | Status: DC

## 2017-01-19 MED ORDER — HYDRALAZINE HCL 50 MG PO TABS: 50.0000 mg | ORAL_TABLET | Freq: Three times a day (TID) | ORAL | Status: DC

## 2017-01-19 MED ORDER — ETOMIDATE 2 MG/ML IV SOLN
30.0000 mg | Freq: Once | INTRAVENOUS | Status: AC
  Administered 2017-01-19: 30 mg via INTRAVENOUS

## 2017-01-19 MED ORDER — IPRATROPIUM-ALBUTEROL 0.5-2.5 (3) MG/3ML IN SOLN
RESPIRATORY_TRACT | Status: AC
  Filled 2017-01-19: qty 3

## 2017-01-19 MED ORDER — VANCOMYCIN HCL 10 G IV SOLR
1750.0000 mg | INTRAVENOUS | Status: DC
  Administered 2017-01-20: 1750 mg via INTRAVENOUS
  Filled 2017-01-19 (×2): qty 1750

## 2017-01-19 MED ORDER — SODIUM CHLORIDE 0.9 % IV SOLN: INTRAVENOUS | Status: DC

## 2017-01-19 MED ORDER — DEXTROSE 5 % IV SOLN
2.0000 g | INTRAVENOUS | Status: DC
  Administered 2017-01-19 – 2017-01-24 (×6): 2 g via INTRAVENOUS
  Filled 2017-01-19 (×8): qty 2

## 2017-01-19 MED ORDER — HEPARIN SODIUM (PORCINE) 5000 UNIT/ML IJ SOLN: 5000.0000 [IU] | Freq: Three times a day (TID) | INTRAMUSCULAR | Status: DC

## 2017-01-19 MED ORDER — SODIUM CHLORIDE 0.9 % IV SOLN
2000.0000 mg | Freq: Once | INTRAVENOUS | Status: AC
  Administered 2017-01-19: 2000 mg via INTRAVENOUS
  Filled 2017-01-19: qty 2000

## 2017-01-19 MED ORDER — FUROSEMIDE 10 MG/ML IJ SOLN: 40.0000 mg | Freq: Once | INTRAMUSCULAR | Status: DC

## 2017-01-19 MED ORDER — INSULIN ASPART 100 UNIT/ML ~~LOC~~ SOLN
0.0000 [IU] | SUBCUTANEOUS | Status: DC
  Administered 2017-01-20: 3 [IU] via SUBCUTANEOUS
  Administered 2017-01-20 (×2): 2 [IU] via SUBCUTANEOUS
  Administered 2017-01-20 (×4): 3 [IU] via SUBCUTANEOUS
  Administered 2017-01-21 – 2017-01-22 (×9): 2 [IU] via SUBCUTANEOUS
  Administered 2017-01-23 – 2017-01-24 (×3): 3 [IU] via SUBCUTANEOUS
  Administered 2017-01-24: 2 [IU] via SUBCUTANEOUS
  Administered 2017-01-24: 3 [IU] via SUBCUTANEOUS
  Administered 2017-01-25: 2 [IU] via SUBCUTANEOUS

## 2017-01-19 MED ORDER — SODIUM CHLORIDE 0.9 % IV SOLN
25.0000 ug/h | INTRAVENOUS | Status: DC
  Administered 2017-01-19: 25 ug/h via INTRAVENOUS
  Administered 2017-01-19: 50 ug/h via INTRAVENOUS
  Administered 2017-01-20: 40 ug/h via INTRAVENOUS
  Filled 2017-01-19 (×2): qty 50

## 2017-01-19 MED ORDER — DEXTROSE 5 % IV SOLN: 2.0000 g | Freq: Two times a day (BID) | INTRAVENOUS | Status: DC

## 2017-01-19 MED ORDER — PROPOFOL 1000 MG/100ML IV EMUL
0.0000 ug/kg/min | INTRAVENOUS | Status: DC
  Administered 2017-01-19: 33.602 ug/kg/min via INTRAVENOUS

## 2017-01-19 MED ORDER — ATORVASTATIN CALCIUM 40 MG PO TABS
40.0000 mg | ORAL_TABLET | Freq: Every day | ORAL | Status: DC
  Administered 2017-01-20 – 2017-01-21 (×2): 40 mg
  Filled 2017-01-19 (×2): qty 1

## 2017-01-19 MED ORDER — ALBUTEROL (5 MG/ML) CONTINUOUS INHALATION SOLN
10.0000 mg/h | INHALATION_SOLUTION | Freq: Once | RESPIRATORY_TRACT | Status: AC
  Administered 2017-01-19: 10 mg/h via RESPIRATORY_TRACT
  Filled 2017-01-19: qty 20

## 2017-01-19 MED ORDER — IPRATROPIUM-ALBUTEROL 0.5-2.5 (3) MG/3ML IN SOLN
3.0000 mL | Freq: Four times a day (QID) | RESPIRATORY_TRACT | Status: DC
  Administered 2017-01-19 – 2017-01-20 (×5): 3 mL via RESPIRATORY_TRACT
  Filled 2017-01-19 (×4): qty 3

## 2017-01-19 MED ORDER — APIXABAN 5 MG PO TABS
5.0000 mg | ORAL_TABLET | Freq: Two times a day (BID) | ORAL | Status: DC
  Administered 2017-01-20 (×3): 5 mg via ORAL
  Filled 2017-01-19 (×3): qty 1

## 2017-01-19 MED ORDER — PANTOPRAZOLE SODIUM 40 MG IV SOLR
40.0000 mg | Freq: Every day | INTRAVENOUS | Status: DC
  Administered 2017-01-20 (×2): 40 mg via INTRAVENOUS
  Filled 2017-01-19 (×2): qty 40

## 2017-01-19 MED ORDER — FUROSEMIDE 10 MG/ML IJ SOLN
80.0000 mg | Freq: Once | INTRAMUSCULAR | Status: DC
Start: 2017-01-19 — End: 2017-01-19
  Filled 2017-01-19: qty 8

## 2017-01-19 MED ORDER — FENTANYL BOLUS VIA INFUSION
25.0000 ug | INTRAVENOUS | Status: DC | PRN
  Filled 2017-01-19: qty 25

## 2017-01-19 MED ORDER — ASPIRIN 81 MG PO TBEC: 81.0000 mg | DELAYED_RELEASE_TABLET | Freq: Every day | ORAL | Status: DC

## 2017-01-19 MED ORDER — SUCCINYLCHOLINE CHLORIDE 20 MG/ML IJ SOLN
200.0000 mg | Freq: Once | INTRAMUSCULAR | Status: AC
  Administered 2017-01-19: 200 mg via INTRAVENOUS

## 2017-01-19 MED ORDER — FUROSEMIDE 40 MG PO TABS: 40.0000 mg | ORAL_TABLET | Freq: Two times a day (BID) | ORAL | Status: DC

## 2017-01-19 MED ORDER — POLYETHYLENE GLYCOL 3350 17 GM/SCOOP PO POWD: 1.0000 | Freq: Every day | ORAL | Status: DC | PRN

## 2017-01-19 MED ORDER — FUROSEMIDE 10 MG/ML IJ SOLN
60.0000 mg | Freq: Two times a day (BID) | INTRAMUSCULAR | Status: DC
  Administered 2017-01-19: 60 mg via INTRAVENOUS

## 2017-01-19 MED ORDER — CARVEDILOL 25 MG PO TABS: 25.0000 mg | ORAL_TABLET | Freq: Two times a day (BID) | ORAL | Status: DC

## 2017-01-19 MED ORDER — APIXABAN 5 MG PO TABS: 5.0000 mg | ORAL_TABLET | Freq: Two times a day (BID) | ORAL | Status: DC

## 2017-01-19 MED ORDER — IPRATROPIUM BROMIDE 0.02 % IN SOLN
1.0000 mg | Freq: Once | RESPIRATORY_TRACT | Status: AC
  Administered 2017-01-19: 1 mg via RESPIRATORY_TRACT
  Filled 2017-01-19: qty 5

## 2017-01-19 MED ORDER — ARFORMOTEROL TARTRATE 15 MCG/2ML IN NEBU
15.0000 ug | INHALATION_SOLUTION | Freq: Two times a day (BID) | RESPIRATORY_TRACT | Status: DC
  Administered 2017-01-20 – 2017-01-25 (×11): 15 ug via RESPIRATORY_TRACT
  Filled 2017-01-19 (×11): qty 2

## 2017-01-19 MED ORDER — AMIODARONE HCL 200 MG PO TABS: 200.0000 mg | ORAL_TABLET | Freq: Every day | ORAL | Status: DC

## 2017-01-19 MED ORDER — METRONIDAZOLE IN NACL 5-0.79 MG/ML-% IV SOLN
500.0000 mg | Freq: Three times a day (TID) | INTRAVENOUS | Status: DC
  Administered 2017-01-19 – 2017-01-23 (×11): 500 mg via INTRAVENOUS
  Filled 2017-01-19 (×13): qty 100

## 2017-01-19 MED ORDER — METHYLPREDNISOLONE SODIUM SUCC 125 MG IJ SOLR
60.0000 mg | Freq: Four times a day (QID) | INTRAMUSCULAR | Status: DC
  Administered 2017-01-20 (×2): 60 mg via INTRAVENOUS
  Filled 2017-01-19 (×2): qty 2

## 2017-01-19 MED ORDER — ASPIRIN 81 MG PO CHEW
81.0000 mg | CHEWABLE_TABLET | Freq: Every day | ORAL | Status: DC
  Administered 2017-01-20 – 2017-01-21 (×3): 81 mg
  Filled 2017-01-19 (×3): qty 1

## 2017-01-19 MED ORDER — FENTANYL CITRATE (PF) 100 MCG/2ML IJ SOLN: 100.0000 ug | INTRAMUSCULAR | Status: DC | PRN

## 2017-01-19 MED ORDER — PROPOFOL 1000 MG/100ML IV EMUL
INTRAVENOUS | Status: AC
  Filled 2017-01-19: qty 100

## 2017-01-19 MED ORDER — MIDAZOLAM HCL 2 MG/2ML IJ SOLN
1.0000 mg | INTRAMUSCULAR | Status: DC | PRN
  Administered 2017-01-20: 1 mg via INTRAVENOUS

## 2017-01-19 MED ORDER — CIPROFLOXACIN IN D5W 400 MG/200ML IV SOLN
400.0000 mg | Freq: Once | INTRAVENOUS | Status: AC
  Administered 2017-01-19: 400 mg via INTRAVENOUS
  Filled 2017-01-19: qty 200

## 2017-01-19 MED ORDER — GABAPENTIN 100 MG PO CAPS: 200.0000 mg | ORAL_CAPSULE | Freq: Three times a day (TID) | ORAL | Status: DC

## 2017-01-19 MED ORDER — BUDESONIDE 0.5 MG/2ML IN SUSP
0.5000 mg | Freq: Two times a day (BID) | RESPIRATORY_TRACT | Status: DC
  Administered 2017-01-19 – 2017-01-25 (×12): 0.5 mg via RESPIRATORY_TRACT
  Filled 2017-01-19 (×11): qty 2

## 2017-01-19 MED ORDER — INSULIN ASPART 100 UNIT/ML ~~LOC~~ SOLN: 0.0000 [IU] | Freq: Three times a day (TID) | SUBCUTANEOUS | Status: DC

## 2017-01-19 MED ORDER — SODIUM CHLORIDE 0.9 % IV SOLN: 250.0000 mL | INTRAVENOUS | Status: DC | PRN

## 2017-01-19 MED ORDER — BUDESONIDE 0.5 MG/2ML IN SUSP
RESPIRATORY_TRACT | Status: AC
  Filled 2017-01-19: qty 2

## 2017-01-19 MED ORDER — METHYLPREDNISOLONE SODIUM SUCC 125 MG IJ SOLR
125.0000 mg | Freq: Once | INTRAMUSCULAR | Status: AC
  Administered 2017-01-19: 125 mg via INTRAVENOUS
  Filled 2017-01-19: qty 2

## 2017-01-19 NOTE — ED Notes (Signed)
Bed: UJ81WA25 Expected date:  Expected time:  Means of arrival:  Comments: EMS hypoxia and sore

## 2017-01-19 NOTE — Progress Notes (Deleted)
ANTICOAGULATION CONSULT NOTE - Initial Consult  Pharmacy Consult for heparin while of apixaban Indication: atrial fibrillation  Allergies  Allergen Reactions  . Lisinopril Swelling    Tongue swelling   . Ace Inhibitors Other (See Comments)    unknown  . Codeine Itching and Rash  . Penicillins Itching and Rash    Has patient had a PCN reaction causing immediate rash, facial/tongue/throat swelling, SOB or lightheadedness with hypotension: unknown Has patient had a PCN reaction causing severe rash involving mucus membranes or skin necrosis: unknown Has patient had a PCN reaction that required hospitalization: no Has patient had a PCN reaction occurring within the last 10 years: no If all of the above answers are "NO", then may proceed with Cephalosporin use.  . Tomato     Itchy rash    Patient Measurements: Height: 5\' 8"  (172.7 cm) Weight: (!) 328 lb (148.8 kg) IBW/kg (Calculated) : 63.9 Heparin Dosing Weight: 100kg   Vital Signs: Temp: 98.6 F (37 C) (08/09 1414) Temp Source: Oral (08/09 1413) BP: 140/70 (08/09 1414) Pulse Rate: 93 (08/09 1414)  Labs:  Recent Labs  01/19/17 1535  HGB 8.2*  HCT 24.7*  PLT 332  CREATININE 1.76*    Estimated Creatinine Clearance: 43.3 mL/min (A) (by C-G formula based on SCr of 1.76 mg/dL (H)).   Medical History: Past Medical History:  Diagnosis Date  . Atrial flutter with rapid ventricular response (HCC) 07/11/2016  . CAD (coronary artery disease) 05/04/2011  . Diabetes mellitus without complication (HCC)   . Hyperlipidemia   . Hypertension   . Lumbar disc disease   . Morbid Obesity 05/02/2011  . Non-ST elevation myocardial infarction (NSTEMI), initial episode of care (HCC) 05/02/2011    Assessment: [age] YOF with history of atrial fibrillation on apixaban 5mg  but taking once daily instead of prescribed twice daily.  Last dose was 8/8 at 20:30.  Patient came in with wound infection but has required mechanical ventilation.     Baseline aPTT, anti-Xa level (ie. heparin level) pending  Today, 01/19/2017  CBC: Hgn = 8.2 (h/o anemia), pltc WNL  Renal: SCr consistent with previous values  Goal of Therapy:  Heparin level 0.3-0.7 units/ml  APTT 66-102 sec Monitor platelets by anticoagulation protocol: Yes   Plan:   Heparin (no bolus for recent apixaban) at 1600 units/hr  Check 8h aPTT  Dose per aPTT until correlates with anti-Xa as apixaban with increase anti-Xa level  Daily heparin level, aPTT and CBC  Juliette Alcideustin Maddex Garlitz, PharmD, BCPS.   Pager: [phone] 01/19/2017 9:50 PM

## 2017-01-19 NOTE — ED Notes (Signed)
Propofol weaned

## 2017-01-19 NOTE — ED Provider Notes (Signed)
I was called to the room by nursing due to respiratory distress. Per report, the patient has had progressively worsening respiratory distress over the last hour. They attempted to contact the hospitalist without success. On my assessment, the patient is tripoding, grunting, in obvious distress. A BiPAP was placed while I was examining her and she continues to grunt with diffuse wheezing and poor aeration. She has been intubated previously. I reviewed old records and confirmed that she is full code. Given her profound resp distress, altered mental status, and work of breathing, feel pt needs to be intubated. Patient moved to the resuscitation bay. She was preoxygenated and intubated by myself uneventfully. Patient tolerated the procedure well with O2 sats of 100% throughout the procedure. I discussed with hospitalist to make him aware and will consult the intensivist for admission. Additional lab work has been obtained. Patient has orders received antibiotics.  CXR confirms ETT placement. Additional labs sent. Propofol gtt ordered. CXR is c/f flash edema - pt given lasix, will continue PPV and admit to the ICU.  CRITICAL CARE Performed by: Dollene Clevelandameron Isascs   Total critical care time: 40 minutes  Critical care time was exclusive of separately billable procedures and treating other patients.  Critical care was necessary to treat or prevent imminent or life-threatening deterioration.  Critical care was time spent personally by me on the following activities: development of treatment plan with patient and/or surrogate as well as nursing, discussions with consultants, evaluation of patient's response to treatment, examination of patient, obtaining history from patient or surrogate, ordering and performing treatments and interventions, ordering and review of laboratory studies, ordering and review of radiographic studies, pulse oximetry and re-evaluation of patient's condition.   Procedure Name:  Intubation Date/Time: 01/19/2017 11:40 PM Performed by: Shaune PollackISAACS, Telisha Zawadzki Pre-anesthesia Checklist: Patient identified, Emergency Drugs available, Suction available, Patient being monitored and Timeout performed Oxygen Delivery Method: Ambu bag Preoxygenation: Pre-oxygenation with 100% oxygen Induction Type: Rapid sequence Ventilation: Mask ventilation without difficulty Laryngoscope Size: Glidescope and 4 Grade View: Grade I Tube size: 7.5 mm Number of attempts: 1 Placement Confirmation: ETT inserted through vocal cords under direct vision,  Positive ETCO2,  CO2 detector and Breath sounds checked- equal and bilateral Secured at: 23 cm Tube secured with: Tape Dental Injury: Teeth and Oropharynx as per pre-operative assessment  Difficulty Due To: Difficulty was unanticipated Future Recommendations: Recommend- induction with short-acting agent, and alternative techniques readily available         Shaune PollackIsaacs, Devine Dant, MD 01/19/17 2341

## 2017-01-19 NOTE — H&P (Signed)
History and Physical:    Jamie Mason   ZOX:[id number] DOB: 1940-07-31 DOA: 01/19/2017  Referring MD/provider: Dr Adela Lank PCP: Burns Spain, MD   Patient coming from: home  Chief Complaint: "the nurse told me to come in because of that on my leg"  History of Present Illness:   Jamie Mason is an 76 y.o. female  with past medical history significant for hypertension, diabetes type 2, hyperlipidemia, coronary artery disease, atrial fibrillation and flutter and chronic lower extremity edema who who comes in for a lower extremity wound. Patient states she was in her usual state of health and feels at baseline but came in today at the urging of her home nurse. Patient states that her nurse said she has a wound that is covered with maggots that needs to get taken care of. Patient herself has no acute complaints. She does not have any pain in the leg. She denies even knowing that the wound was there until the nurse told her that it was.  ED Course:  Patient was given 1 dose of Vanco and ciprofloxacin and admitted for further treatment. General surgery was called and referred patient to orthopedic surgery. Orthopedic surgery saw patient and felt that given negative x-ray and no bony involvement this should be managed by general surgery. General surgery will come see the patient tomorrow.  ROS:   ROS patient denies fevers or chills or malaise. She doesn't feel any different from baseline. She admits to baseline shortness of breath and DOE without change. She denies any chest pain or palpitations. No syncope or presyncope no dizziness or fatigue different from baseline.  Past Medical History:   Past Medical History:  Diagnosis Date  . Atrial flutter with rapid ventricular response (HCC) 07/11/2016  . CAD (coronary artery disease) 05/04/2011  . Diabetes mellitus without complication (HCC)   . Hyperlipidemia   . Hypertension   . Lumbar disc disease   . Morbid Obesity 05/02/2011  .  Non-ST elevation myocardial infarction (NSTEMI), initial episode of care [facility]) 05/02/2011    Past Surgical History:   Past Surgical History:  Procedure Laterality Date  . CARDIAC CATHETERIZATION N/A 07/11/2016   Procedure: Left Heart Cath and Coronary Angiography;  Surgeon: [name], MD;  Location: [facility] INVASIVE CV LAB;  Service: Cardiovascular;  Laterality: N/A;  . CARDIOVERSION N/A 07/13/2016   Procedure: CARDIOVERSION;  Surgeon: Wendall Stade, MD;  Location: [facility] ENDOSCOPY;  Service: Cardiovascular;  Laterality: N/A;  . LEFT HEART CATHETERIZATION WITH CORONARY ANGIOGRAM N/A 05/03/2011   Procedure: LEFT HEART CATHETERIZATION WITH CORONARY ANGIOGRAM;  Surgeon: Othella Boyer, MD;  Location: [facility] CATH LAB;  Service: Cardiovascular;  Laterality: N/A;  . none      Social History:   Social History   Social History  . Marital status: Married    Spouse name: N/A  . Number of children: N/A  . Years of education: 74   Occupational History  .  Unemployed   Social History Main Topics  . Smoking status: Former Smoker    Packs/day: 0.50    Years: 50.00    Types: Cigarettes    Quit date: 04/01/2011  . Smokeless tobacco: Never Used  . Alcohol use No  . Drug use: No  . Sexual activity: No   Other Topics Concern  . Not on file   Social History Narrative   June 2016 : married. Lives with husband (blind) and adult daughter. 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 Independent in ADL's. Doesn't go to stores / church 2/2 pain / trouble walking. Cold [place] tobacco about 2011.    Allergies   Lisinopril; Ace inhibitors; Codeine; Penicillins; and Tomato  Family history:   Family History  Problem Relation Age of Onset  . Cancer Father   . Stroke Mother     Current Medications:   Prior to Admission medications   Medication Sig Start Date End Date Taking? Authorizing Provider  amiodarone (PACERONE) 200 MG tablet Take 1 tablet (200 mg total) by  mouth daily. 12/22/16  Yes Burns Spain, MD  apixaban (ELIQUIS) 5 MG TABS tablet Take 1 tablet (5 mg total) by mouth 2 (two) times daily. 09/06/16  Yes Burns Spain, MD  aspirin (ASPIR-LOW) 81 MG EC tablet Take 81 mg by mouth daily. Swallow whole.   Yes [provider]  atorvastatin (LIPITOR) 40 MG tablet Take 1 tablet (40 mg total) by mouth daily. 09/06/16  Yes Burns Spain, MD  carvedilol (COREG) 25 MG tablet Take 1 tablet (25 mg total) by mouth 2 (two) times daily with a meal. 11/03/16  Yes Burns Spain, MD  diltiazem (CARTIA XT) 120 MG 24 hr capsule Take 1 capsule (120 mg total) by mouth daily. Patient taking differently: Take 240 mg by mouth daily.  12/20/16 12/20/17 Yes Burns Spain, MD  furosemide (LASIX) 40 MG tablet Take 1 tablet (40 mg total) by mouth 2 (two) times daily. Take 2 tablets by mouth every morning and 1 tablet by mouth every afternoon Patient taking differently: Take 40 mg by mouth 2 (two) times daily.  11/15/16  Yes Rice, Jamesetta Orleans, MD  gabapentin (NEURONTIN) 100 MG capsule Take 2 capsules (200 mg total) by mouth 3 (three) times daily. 10/21/16 10/21/17 Yes Burns Spain, MD  hydrALAZINE (APRESOLINE) 50 MG tablet Take 1 tablet (50 mg total) by mouth 3 (three) times daily. 12/29/16  Yes Rozann Lesches, MD  metFORMIN (GLUCOPHAGE) 500 MG tablet Take 1 tablet (500 mg total) by mouth 2 (two) times daily with a meal. 10/21/16  Yes Burns Spain, MD  albuterol (PROVENTIL HFA;VENTOLIN HFA) 108 (90 Base) MCG/ACT inhaler Inhale 2 puffs into the lungs every 6 (six) hours as needed for wheezing or shortness of breath. 12/23/16   Rozann Lesches, MD  hydrocerin (EUCERIN) CREA Apply to bilateral LEs after cleansing on Mondays and Thursdays (prior to [employer] application). 06/30/16   Noralee Stain Chahn-Yang, DO  polyethylene glycol powder (GLYCOLAX/MIRALAX) powder MIX 1 CAPFUL (17GM) IN LIQUID AND DRINK EVERY DAY 12/19/16   Burns Spain, MD    Physical Exam:   Vitals:   01/19/17 1413 01/19/17 1414  BP: (!) 148/63 140/70  Pulse: 93 93  Resp: 18 16  Temp: 98.8 F (37.1 C) 98.6 F (37 C)  TempSrc: Oral   SpO2: 94% 90%  Weight:  (!) 148.8 kg (328 lb)  Height:  5\' 8"  (1.727 m)     Physical Exam: Blood pressure 140/70, pulse 93, temperature 98.6 F (37 C), resp. rate 16, height 5\' 8"  (1.727 m), weight (!) 148.8 kg (328 lb), SpO2 90 %. Gen: Obese, poorly kempt female sitting up in stretcher in no distress. There is distinct odor in the room. Head: Normocephalic, atraumatic. Eyes:  Sclerae nonicteric. Conjunctiva not injected Chest: Poor air entry bilaterally with rare scattered high-pitched wheeze. No rhonchi. CV: Distant heart sounds, they sound regular. Abdomen: Obese, soft, nontender. Extremities: Lower extremities with changes of  chronic venous stasis with brawny edema and hyperpigmentation. She has extremely long nails bilaterally.  I'm unable to palpate pulses through the edema. We will need to get Dopplers. No cyanosis. Skin: There is a 2 cm foul-smelling wound on the medial aspect of left calf which is covered in maggots. There does appear to be some surrounding increased warmth and possibly increased erythema. Neuro: Alert and oriented times 3; grossly nonfocal. Psych: Insight is poor, patient feels it is the nurse who is to blame for not telling her how to care for the wound.  Data Review:    Labs: Basic Metabolic Panel:  Recent Labs Lab 01/19/17 1535  NA 143  K 3.7  CL 107  CO2 28  GLUCOSE 122*  BUN 31*  CREATININE 1.76*  CALCIUM 9.0   Liver Function Tests: No results for input(s): AST, ALT, ALKPHOS, BILITOT, PROT, ALBUMIN in the last 168 hours. No results for input(s): LIPASE, AMYLASE in the last 168 hours. No results for input(s): AMMONIA in the last 168 hours. CBC:  Recent Labs Lab 01/19/17 1535  WBC 9.6  NEUTROABS 7.4  HGB 8.2*  HCT 24.7*  MCV 86.1  PLT 332    Cardiac Enzymes: No results for input(s): CKTOTAL, CKMB, CKMBINDEX, TROPONINI in the last 168 hours.  BNP (last 3 results) No results for input(s): PROBNP in the last 8760 hours. CBG: No results for input(s): GLUCAP in the last 168 hours.  Urinalysis    Component Value Date/Time   COLORURINE YELLOW 07/10/2016 1021   APPEARANCEUR CLEAR 07/10/2016 1021   LABSPEC 1.018 07/10/2016 1021   PHURINE 6.0 07/10/2016 1021   GLUCOSEU 50 (A) 07/10/2016 1021   HGBUR SMALL (A) 07/10/2016 1021   BILIRUBINUR NEGATIVE 07/10/2016 1021   BILIRUBINUR Negative 03/19/2014 1631   KETONESUR NEGATIVE 07/10/2016 1021   PROTEINUR NEGATIVE 07/10/2016 1021   UROBILINOGEN 0.2 03/19/2014 1631   NITRITE NEGATIVE 07/10/2016 1021   LEUKOCYTESUR SMALL (A) 07/10/2016 1021      Radiographic Studies: Dg Tibia/fibula Left  Result Date: 01/19/2017 CLINICAL DATA:  Nonhealing wound of distal left lower leg. EXAM: LEFT TIBIA AND FIBULA - 2 VIEW COMPARISON:  06/26/2016 FINDINGS: There is no evidence of acute fracture, subluxation or dislocation. No radiographic evidence of osteomyelitis noted. Degenerative changes in the knee noted. Calcaneal spur is again noted. No radiopaque foreign bodies are identified. IMPRESSION: No evidence of acute bony abnormality. Electronically Signed   By: Harmon Pier M.D.   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 We will treat patient with vancomycin and ciprofloxacin and Flagyl given penicillin allergy Orthopedic surgery evaluated patient in the ED and felt this was better treated by general surgery. Gen. surgery consultation has been requested for wound  management, they will consult tomorrow.  We'll also request nurse wound consultation.  DM Hold metformin. We'll cover with sliding scale insulin.  HTN Continue hydralazine, diltiazem, Lasix, carvedilol  CAD Continue carvedilol and atorvastatin and aspirin.  DIASTOLIC HEART FAILURE  continue hydralazine, diltiazem and Lasix  patient is compensated at present.   AFLUTTER Continue amiodarone, diltiazem and Eliquis  HL Continue Atorvastatin     Other information:   DVT  prophylaxis: Lovenox ordered. Code Status: Full code. Family Communication: Patient's daughter was in room throughout the encounter Disposition Plan: Home Consults called: General surgery and orthopedic surgery Admission status: Inpatient MedSurg  The medical decision making on this patient was of high complexity and the patient is at high risk for clinical deterioration, therefore this is a level 3 visit.  The medical decision making is of moderate complexity, therefore this is a level 2 visit.  Horatio PelSrobona Orma [place]aming[place]u Dimitria Ketchum [facility] Pager [phone] Cell: [phone]   If 7PM-7AM, please contact night-coverage [email or web address] Password [email or web address] 01/19/2017, 5:47 PM

## 2017-01-19 NOTE — Telephone Encounter (Signed)
I agree... Thank you Helen 

## 2017-01-19 NOTE — ED Provider Notes (Signed)
[facility] DEPT Provider Note   CSN: [id number] Arrival date & time: 01/19/17  1402     History   Chief Complaint No chief complaint on file.   HPI Jamie Mason is a 76 y.o. female.  76 yo F with a chief complaint of a wound to her left leg. This is been an ongoing issue. She was seen by wound care today and they told her that she had maggots coming out of it. She then decided to come to the ED. Denies any pain to the area. Denies injury. Denies fevers or chills. Denies vomiting or nausea.   The history is provided by the patient.  Illness  This is a new problem. The current episode started yesterday. The problem occurs constantly. The problem has not changed since onset.Pertinent negatives include no chest pain, no headaches and no shortness of breath. Nothing aggravates the symptoms. Nothing relieves the symptoms. She has tried nothing for the symptoms. The treatment provided no relief.    Past Medical History:  Diagnosis Date  . Atrial flutter with rapid ventricular response ([place]) 07/11/2016  . CAD (coronary artery disease) 05/04/2011  . Diabetes mellitus without complication ([place])   . Hyperlipidemia   . Hypertension   . Lumbar disc disease   . Morbid Obesity 05/02/2011  . Non-ST elevation myocardial infarction (NSTEMI), initial episode of care [facility]) 05/02/2011    Patient Active Problem List   Diagnosis Date Noted  . Constipation 11/15/2016  . Chronic diastolic CHF (congestive heart failure) ([place]) 10/19/2016  . Post herpetic neuralgia 08/19/2016  . Atrial flutter ([place]) 07/11/2016  . Chronic venous insufficiency 06/30/2016  . Goals of care, counseling/discussion 07/24/2015  . Severe obesity (BMI >= 40) ([place]) 08/28/2014  . Type 2 diabetes mellitus with stage 2 chronic kidney disease ([place]) 04/05/2014  . Dyspnea on exertion 03/19/2014  . Healthcare maintenance 03/19/2014  . CAD (coronary artery disease) 05/03/2011  . Hypertension 05/02/2011  . Hyperlipidemia  05/02/2011  . Lumbar disc disease 05/02/2011    Past Surgical History:  Procedure Laterality Date  . CARDIAC CATHETERIZATION N/A 07/11/2016   Procedure: Left Heart Cath and Coronary Angiography;  Surgeon: Nelva Bush, MD;  Location: [facility] CV LAB;  Service: Cardiovascular;  Laterality: N/A;  . CARDIOVERSION N/A 07/13/2016   Procedure: CARDIOVERSION;  Surgeon: Josue Hector, MD;  Location: [facility] ENDOSCOPY;  Service: Cardiovascular;  Laterality: N/A;  . LEFT HEART CATHETERIZATION WITH CORONARY ANGIOGRAM N/A 05/03/2011   Procedure: LEFT HEART CATHETERIZATION WITH CORONARY ANGIOGRAM;  Surgeon: Jacolyn Reedy, MD;  Location: [facility] CATH LAB;  Service: Cardiovascular;  Laterality: N/A;  . none      OB History    No data available       Home Medications    Prior to Admission medications   Medication Sig Start Date End Date Taking? Authorizing Provider  albuterol (PROVENTIL HFA;VENTOLIN HFA) 108 (90 Base) MCG/ACT inhaler Inhale 2 puffs into the lungs every 6 (six) hours as needed for wheezing or shortness of breath. 12/23/16   Thomasene Ripple, MD  amiodarone (PACERONE) 200 MG tablet Take 1 tablet (200 mg total) by mouth daily. 12/22/16   Bartholomew Crews, MD  apixaban (ELIQUIS) 5 MG TABS tablet Take 1 tablet (5 mg total) by mouth 2 (two) times daily. 09/06/16   Bartholomew Crews, MD  aspirin (ASPIR-LOW) 81 MG EC tablet Take 81 mg by mouth daily. Swallow whole.    [provider]  atorvastatin (LIPITOR) 40 MG tablet Take 1 tablet (40  mg total) by mouth daily. 09/06/16   Bartholomew Crews, MD  carvedilol (COREG) 25 MG tablet Take 1 tablet (25 mg total) by mouth 2 (two) times daily with a meal. 11/03/16   Bartholomew Crews, MD  diltiazem (CARTIA XT) 120 MG 24 hr capsule Take 1 capsule (120 mg total) by mouth daily. 12/20/16 12/20/17  Bartholomew Crews, MD  furosemide (LASIX) 40 MG tablet Take 1 tablet (40 mg total) by mouth 2 (two) times daily. Take 2 tablets by mouth every  morning and 1 tablet by mouth every afternoon 11/15/16   Collier Salina, MD  gabapentin (NEURONTIN) 100 MG capsule Take 2 capsules (200 mg total) by mouth 3 (three) times daily. 10/21/16 10/21/17  Bartholomew Crews, MD  hydrALAZINE (APRESOLINE) 50 MG tablet Take 1 tablet (50 mg total) by mouth 3 (three) times daily. 12/29/16   Thomasene Ripple, MD  hydrocerin (EUCERIN) CREA Apply to bilateral LEs after cleansing on Mondays and Thursdays (prior to [employer] application). 06/30/16   Dessa Phi Chahn-Yang, DO  metFORMIN (GLUCOPHAGE) 500 MG tablet Take 1 tablet (500 mg total) by mouth 2 (two) times daily with a meal. 10/21/16   Bartholomew Crews, MD  polyethylene glycol powder (GLYCOLAX/MIRALAX) powder MIX 1 CAPFUL (17GM) IN LIQUID AND DRINK EVERY DAY 12/19/16   Bartholomew Crews, MD    Family History Family History  Problem Relation Age of Onset  . Cancer Father   . Stroke Mother     Social History Social History  Substance Use Topics  . Smoking status: Former Smoker    Packs/day: 0.50    Years: 50.00    Types: Cigarettes    Quit date: 04/01/2011  . Smokeless tobacco: Never Used  . Alcohol use No     Allergies   Lisinopril; Ace inhibitors; Codeine; Penicillins; and Tomato   Review of Systems Review of Systems  Constitutional: Negative for chills and fever.  HENT: Negative for congestion and rhinorrhea.   Eyes: Negative for redness and visual disturbance.  Respiratory: Negative for shortness of breath and wheezing.   Cardiovascular: Negative for chest pain and palpitations.  Gastrointestinal: Negative for nausea and vomiting.  Genitourinary: Negative for dysuria and urgency.  Musculoskeletal: Negative for arthralgias and myalgias.  Skin: Positive for wound. Negative for pallor.  Neurological: Negative for dizziness and headaches.     Physical Exam Updated Vital Signs BP 140/70   Pulse 93   Temp 98.6 F (37 C)   Resp 16   Ht _0  (1.727 m)   Wt (!) 148.8 kg  (328 lb)   SpO2 90%   BMI 49.87 kg/m   Physical Exam  Constitutional: She is oriented to person, place, and time. She appears well-developed and well-nourished. No distress.  HENT:  Head: Normocephalic and atraumatic.  Eyes: Pupils are equal, round, and reactive to light. EOM are normal.  Neck: Normal range of motion. Neck supple.  Cardiovascular: Normal rate and regular rhythm.  Exam reveals no gallop and no friction rub.   No murmur heard. Pulmonary/Chest: Effort normal. She has no wheezes. She has no rales.  Abdominal: Soft. She exhibits no distension and no mass. There is no tenderness. There is no guarding.  Musculoskeletal: She exhibits no edema or tenderness.  Necrotic smell, no purulent drainage, maggots at the site.   Pulse motor and sensation intact distally.  Neurological: She is alert and oriented to person, place, and time.  Skin: Skin is warm and dry. She is not  diaphoretic.  Psychiatric: She has a normal mood and affect. Her behavior is normal.  Nursing note and vitals reviewed.    ED Treatments / Results  Labs (all labs ordered are listed, but only abnormal results are displayed) Labs Reviewed  CBC WITH DIFFERENTIAL/PLATELET  BASIC METABOLIC PANEL    EKG  EKG Interpretation None       Radiology Dg Tibia/fibula Left  Result Date: 01/19/2017 CLINICAL DATA:  Nonhealing wound of distal left lower leg. EXAM: LEFT TIBIA AND FIBULA - 2 VIEW COMPARISON:  06/26/2016 FINDINGS: There is no evidence of acute fracture, subluxation or dislocation. No radiographic evidence of osteomyelitis noted. Degenerative changes in the knee noted. Calcaneal spur is again noted. No radiopaque foreign bodies are identified. IMPRESSION: No evidence of acute bony abnormality. Electronically Signed   By: Margarette Canada M.D.   On: 01/19/2017 15:40    Procedures Procedures (including critical care time)  Medications Ordered in ED Medications  ciprofloxacin (CIPRO) IVPB 400 mg (not  administered)  vancomycin (VANCOCIN) 2,000 mg in sodium chloride 0.9 % 500 mL IVPB (not administered)     Initial Impression / Assessment and Plan / ED Course  I have reviewed the triage vital signs and the nursing notes.  Pertinent labs & imaging results that were available during my care of the patient were reviewed by me and considered in my medical decision making (see chart for details).     76 yo F with a chief complaint of a necrotic wound.Patient is clinically well-appearing. With this issue of necrosis of her leg with ongoing maggots feel that she needs a debridement. Started on broad-spectrum antibiotics. Discussed with the hospitalist.  Discussed Gen surgery and then ortho will come and see patient.   The patients results and plan were reviewed and discussed.   Any x-rays performed were independently reviewed by myself.   Differential diagnosis were considered with the presenting HPI.  Medications  metroNIDAZOLE (FLAGYL) IVPB 500 mg (0 mg Intravenous Stopped 01/20/17 0425)  vancomycin (VANCOCIN) 1,750 mg in sodium chloride 0.9 % 500 mL IVPB (not administered)  atorvastatin (LIPITOR) tablet 40 mg (not administered)  aspirin chewable tablet 81 mg (81 mg Per Tube Given 01/20/17 0902)  amiodarone (PACERONE) tablet 200 mg (200 mg Per Tube Given 01/20/17 0902)  fentaNYL (SUBLIMAZE) injection 50 mcg (not administered)  fentaNYL (SUBLIMAZE) 2,500 mcg in sodium chloride 0.9 % 250 mL (10 mcg/mL) infusion (0 mcg/hr Intravenous Stopped 01/20/17 0835)  fentaNYL (SUBLIMAZE) bolus via infusion 25 mcg (not administered)  midazolam (VERSED) injection 1 mg (1 mg Intravenous Given 01/20/17 0052)  midazolam (VERSED) injection 1 mg (1 mg Intravenous Given 01/20/17 0316)  insulin aspart (novoLOG) injection 0-15 Units (3 Units Subcutaneous Given 01/20/17 0842)  pantoprazole (PROTONIX) injection 40 mg (40 mg Intravenous Given 01/20/17 0125)  0.9 %  sodium chloride infusion (250 mLs Intravenous Rate/Dose  Verify 01/20/17 0042)  cefTRIAXone (ROCEPHIN) 2 g in dextrose 5 % 50 mL IVPB (0 g Intravenous Stopped 01/19/17 2325)  methylPREDNISolone sodium succinate (SOLU-MEDROL) 125 mg/2 mL injection 60 mg (60 mg Intravenous Given 01/20/17 0900)  budesonide (PULMICORT) nebulizer solution 0.5 mg (0.5 mg Nebulization Given 01/20/17 0814)  arformoterol (BROVANA) nebulizer solution 15 mcg (15 mcg Nebulization Given 01/20/17 0814)  ipratropium-albuterol (DUONEB) 0.5-2.5 (3) MG/3ML nebulizer solution 3 mL (3 mLs Nebulization Given 01/20/17 0814)  apixaban (ELIQUIS) tablet 5 mg (5 mg Oral Given 01/20/17 0901)  albuterol (PROVENTIL) (2.5 MG/3ML) 0.083% nebulizer solution 2.5 mg (not administered)  ipratropium-albuterol (DUONEB) 0.5-2.5 (3)  MG/3ML nebulizer solution (  Not Given 01/19/17 2246)  budesonide (PULMICORT) 0.5 MG/2ML nebulizer solution (  Not Given 01/19/17 2247)  chlorhexidine gluconate (MEDLINE KIT) (PERIDEX) 0.12 % solution 15 mL (15 mLs Mouth Rinse Given 01/20/17 0850)  MEDLINE mouth rinse (15 mLs Mouth Rinse Given 01/20/17 0500)  hydrALAZINE (APRESOLINE) injection 10 mg (10 mg Intravenous Given 01/20/17 0316)  furosemide (LASIX) injection 80 mg (80 mg Intravenous Given 01/20/17 0903)  ciprofloxacin (CIPRO) IVPB 400 mg (0 mg Intravenous Stopped 01/19/17 1733)  vancomycin (VANCOCIN) 2,000 mg in sodium chloride 0.9 % 500 mL IVPB (0 mg Intravenous Stopped 01/19/17 1931)  etomidate (AMIDATE) injection 30 mg (30 mg Intravenous Given 01/19/17 2102)  succinylcholine (ANECTINE) injection 200 mg (200 mg Intravenous Given 01/19/17 2103)  methylPREDNISolone sodium succinate (SOLU-MEDROL) 125 mg/2 mL injection 125 mg (125 mg Intravenous Given 01/19/17 2233)  albuterol (PROVENTIL,VENTOLIN) solution continuous neb (10 mg/hr Nebulization Given 01/19/17 2114)  ipratropium (ATROVENT) nebulizer solution 1 mg (1 mg Nebulization Given 01/19/17 2114)  metoprolol tartrate (LOPRESSOR) injection 5 mg (5 mg Intravenous Given 01/20/17 0904)    Vitals:     01/20/17 0600 01/20/17 0630 01/20/17 0700 01/20/17 0800  BP: (!) 160/94 (!) 164/69 (!) 162/69 (!) 151/58  Pulse: 88 89 89 89  Resp: _0 Temp:      TempSrc:      SpO2: 97% 98% 98% 99%  Weight:      Height:        Final diagnoses:  Diabetic ulcer of ankle ([place])    Admission/ observation were discussed with the admitting physician, patient and/or family and they are comfortable with the plan.    Final Clinical Impressions(s) / ED Diagnoses   Final diagnoses:  Diabetic ulcer of ankle [facility])    New Prescriptions New Prescriptions   No medications on file     Deno Etienne, DO 01/20/17 0908

## 2017-01-19 NOTE — ED Notes (Signed)
Patient is awake with weaning propofol and starting fentanyl-dose titrated and bolus fentanyl given. Oropharynx suctioned for moderate clear secretions

## 2017-01-19 NOTE — ED Notes (Signed)
Patient is awake and biting ETT-Fentanyl increased to 175 mcg

## 2017-01-19 NOTE — Telephone Encounter (Signed)
rec'd call from [facility] stephanie [phone] se had rec'd a [facility] stating that pt has an open area on her inner calf that now has maggots in the wound. She states Maxine Glennmonica is sending pt to ED. Spoke w/ dr Heide Sparknarendra and pt should go to ED, relayed to pt's daughter

## 2017-01-19 NOTE — Progress Notes (Signed)
Tried calling report x1. Call [phone] when able.

## 2017-01-19 NOTE — ED Notes (Signed)
Patient to Res B from Room 25 in acute respiratory distress-Dr. Erma HeritageIsaacs at bedside and Amy from RT at bedside and patient prepared for intubation. Patient is currently on bi-pap. Cletis AthensJon, primary RN at bedside with this Clinical research associatewriter. Administered etomidate and succ for sedation and patient intubated by Dr. Erma HeritageIsaacs with 7.5 Fr ETT at the 22-23 cm lip line. Jon RN placed OGT to [employer].

## 2017-01-19 NOTE — ED Triage Notes (Signed)
Patient called EMS for an open wound that was found with maggots by the home health nurse.

## 2017-01-19 NOTE — Patient Outreach (Addendum)
[place] [facility]) Care Management   01/19/2017  Jamie Mason 1940-12-24 [id number]  Jamie Mason is an 76 y.o. female  Subjective:   Member alert and oriented x3, denies pain or discomfort.  She report she has not been weighing herself daily, but has been weighing about every other day.  This care manager unable to confirm as she state log is in the back room.  She report weight gain of 4 pounds in 2 days, report contacting MD office to report weight gain, awaiting call back.  She also report compliance with medications, unable to confirm as medications are also in the bedroom.    Objective:   Review of Systems  HENT: Negative.   Eyes: Negative.   Respiratory: Positive for shortness of breath.        Intermittent  Cardiovascular: Positive for leg swelling.  Gastrointestinal: Negative.   Genitourinary: Negative.   Musculoskeletal: Negative.   Neurological: Positive for weakness.  Endo/Heme/Allergies: Negative.   Psychiatric/Behavioral: Negative.     Physical Exam  Constitutional: She is oriented to person, place, and time. She appears well-developed and well-nourished.  Neck: Normal range of motion.  Cardiovascular: Normal rate, regular rhythm and normal heart sounds.   Respiratory: Effort normal and breath sounds normal.  GI: Soft. Bowel sounds are normal.  Musculoskeletal: Normal range of motion.  Neurological: She is alert and oriented to person, place, and time.  Skin: Skin is warm.  Open wound on left lower leg   BP 138/72 (BP Location: Left Arm, Patient Position: Sitting, Cuff Size: Large)   Pulse 90   Resp (!) 22   Wt (!) 328 lb (148.8 kg) Comment: patient reported  SpO2 96%   BMI 54.58 kg/m   Encounter Medications:   Outpatient Encounter Prescriptions as of 01/19/2017  Medication Sig Note  . albuterol (PROVENTIL HFA;VENTOLIN HFA) 108 (90 Base) MCG/ACT inhaler Inhale 2 puffs into the lungs every 6 (six) hours as needed for wheezing or shortness of  breath.   Marland Kitchen amiodarone (PACERONE) 200 MG tablet Take 1 tablet (200 mg total) by mouth daily.   Marland Kitchen apixaban (ELIQUIS) 5 MG TABS tablet Take 1 tablet (5 mg total) by mouth 2 (two) times daily. 11/18/2016: Patient reports only taking once daily.    Marland Kitchen aspirin (ASPIR-LOW) 81 MG EC tablet Take 81 mg by mouth daily. Swallow whole.   Marland Kitchen atorvastatin (LIPITOR) 40 MG tablet Take 1 tablet (40 mg total) by mouth daily.   . carvedilol (COREG) 25 MG tablet Take 1 tablet (25 mg total) by mouth 2 (two) times daily with a meal.   . diltiazem (CARTIA XT) 120 MG 24 hr capsule Take 1 capsule (120 mg total) by mouth daily.   . furosemide (LASIX) 40 MG tablet Take 1 tablet (40 mg total) by mouth 2 (two) times daily. Take 2 tablets by mouth every morning and 1 tablet by mouth every afternoon 11/18/2016: Patient reports taking 40 mg tabs---two tabs twice daily.    Marland Kitchen gabapentin (NEURONTIN) 100 MG capsule Take 2 capsules (200 mg total) by mouth 3 (three) times daily.   . hydrALAZINE (APRESOLINE) 50 MG tablet Take 1 tablet (50 mg total) by mouth 3 (three) times daily.   . hydrocerin (EUCERIN) CREA Apply to bilateral LEs after cleansing on Mondays and Thursdays (prior to [employer] application).   . metFORMIN (GLUCOPHAGE) 500 MG tablet Take 1 tablet (500 mg total) by mouth 2 (two) times daily with a meal.   . polyethylene glycol  powder (GLYCOLAX/MIRALAX) powder MIX 1 CAPFUL (17GM) IN LIQUID AND DRINK EVERY DAY    No facility-administered encounter medications on file as of 01/19/2017.     Functional Status:   In your present state of health, do you have any difficulty performing the following activities: 12/23/2016 12/01/2016  Hearing? N N  Vision? N N  Difficulty concentrating or making decisions? N N  Walking or climbing stairs? Y Y  Dressing or bathing? N N  Doing errands, shopping? Y Y  Preparing Food and eating ? - -  Using the Toilet? - -  In the past six months, have you accidently leaked urine? - -  Do you have  problems with loss of bowel control? - -  Managing your Medications? - -  Managing your Finances? - -  Housekeeping or managing your Housekeeping? - -  Some recent data might be hidden    Fall/Depression Screening:    Fall Risk  12/23/2016 12/01/2016 11/15/2016  Falls in the past year? No No No  Number falls in past yr: - - -  Injury with Fall? - - -  Risk Factor Category  High Fall Risk High Fall Risk -  Risk for fall due to : - Impaired balance/gait -  Risk for fall due to: Comment - - -  Follow up - Falls prevention discussed -   PHQ 2/9 Scores 12/23/2016 12/01/2016 11/15/2016 11/11/2016 10/19/2016 08/16/2016 11/17/2015  PHQ - 2 Score 0 0 0 0 0 1 1    Assessment:    Met with member at scheduled time.  She state she legs have remained swollen, noted swelling on both legs up to her knees.  She state "I guess I been eating too much."  She admits to eating lots of processed foods which are high in sodium as well as not restricting her fluids.  Re-educated on the what congestive heart failure is and complications of not following management plan.  She state she will be more active, educated that adhering to combination of diet, medications, fluid restriction, as well as increasing activity is needed to manage condition effectively.  She inquires about "getting rid of it."  She is advised that heart failure has not cure but symptoms can be treated with the above plan of care.  She verbalizes understanding.    Member continues to report difficulty with paying for medications at times.  She is reminded that [facility] pharmacist, K. Ruedinger, has been attempting to assist with resources, however he is waiting for her to complete application.  She state she will find paper, complete it, and send it back.  She report she has remained active with home health for nursing and PT.  She has dressing on right leg, bandage hanging off, drainage leaking from under bandage.  She state that she also has two open areas on her  toes and is in need of toenail trimming, appointment with George later this month.  Leg wound assessed, maggots noted on wound bed, foul odor.  She is unable to state when dressing was last changed, report nurse did not change earlier this week when she was seen, stating that she was told the wound nurse would be by to visit her next week.  Member advised to seek emergency medical treatment.  She is hesitant about going to the ED, fearing she will not be able to come back home.  She requests to call MD office to report findings and schedule same day appointment.  She is  advised against it, encouraged to go to the ED.    She reluctantly agrees to go to the ED, but state she prefer for her daughter to take her.  Unfortunately, her daughter is not readily available, encouraged to allow this care manager to contact EMS.  She agreed, EMS called.  Daughter arrived and accompanied member to ED.    Call placed to [facility] at primary MD office to provide update, no answer on triage line, will call back to provide update.    Plan:   Will contact primary MD office to provide update. Will follow up with member within the next week.   Valente David, [place], MSN [place] [phone]

## 2017-01-19 NOTE — Progress Notes (Addendum)
While in the ER after admission, the patient developed progressive respiratory distress. She has been intubated and care will be assumed at this time by PCCM. [facility] will be happy to assume care once the patient is ready for transfer back to our care.  Jamie CurioJennifer E. Brentton Mason, M.D.

## 2017-01-19 NOTE — Progress Notes (Addendum)
Pharmacy Antibiotic Note  Jamie NighGloria C Mason is a 76 y.o. female admitted on 01/19/2017 with cellulitis (diabetic foot ulcer).  Pharmacy has been consulted for vancomycin and cefepime dosing. Metronidazole per MD.   Today, 01/19/2017  Renal: Scr - CKD with elevated SCr (appears to be at baseline)  Normalized CrCl = 1532ml/min  WBC WNL  No evidence of osteomyelitis  Plan:  Vancomycin 1750mg  IV q24h  Trough at steady state (goal 10-15)  Cefepime 2gm IV q12h for empiric pseudomonas coveage  Daily SCr for CKD, obesity and on furosemide  Addendum:  PCCM has changed cefepime to ceftriaxone Plan:  ceftriaxone 2gm IV q24h - no adjustment necessary for renal function, use 2gm for obesity Metronidazole - continue current dosing Will sign off for ceftriaxone and metronidazole dosing   Height: 5\' 8"  (172.7 cm) Weight: (!) 328 lb (148.8 kg) IBW/kg (Calculated) : 63.9  Temp (24hrs), Avg:98.7 F (37.1 C), Min:98.6 F (37 C), Max:98.8 F (37.1 C)   Recent Labs Lab 01/19/17 1535  WBC 9.6  CREATININE 1.76*    Estimated Creatinine Clearance: 43.3 mL/min (A) (by C-G formula based on SCr of 1.76 mg/dL (H)).    Allergies  Allergen Reactions  . Lisinopril Swelling    Tongue swelling   . Ace Inhibitors Other (See Comments)    unknown  . Codeine Itching and Rash  . Penicillins Itching and Rash    Has patient had a PCN reaction causing immediate rash, facial/tongue/throat swelling, SOB or lightheadedness with hypotension: unknown Has patient had a PCN reaction causing severe rash involving mucus membranes or skin necrosis: unknown Has patient had a PCN reaction that required hospitalization: no Has patient had a PCN reaction occurring within the last 10 years: no If all of the above answers are "NO", then may proceed with Cephalosporin use.  . Tomato     Itchy rash    Antimicrobials this admission: 8/9 ciprofloxacin x 1 8/9 vancomycin >> 8/9 ceftriaxone >> 8/9 metronidazole  >> 8/9 cefepime >> 8/9  Dose adjustments this admission:  Microbiology results: No cultures  Thank you for allowing pharmacy to be a part of this patient's care.  Juliette Alcideustin Farhan Jean, PharmD, BCPS.   Pager: [phone] 01/19/2017 6:40 PM

## 2017-01-19 NOTE — ED Notes (Signed)
[facility]

## 2017-01-19 NOTE — Consult Note (Signed)
PULMONARY / CRITICAL CARE MEDICINE   Name: Jamie Mason MRN: [id number] DOB: 03-15-1941    ADMISSION DATE:  01/19/2017 CONSULTATION DATE:  01/19/17  REFERRING MD:  Erma Heritage  CHIEF COMPLAINT:  VDRF  HISTORY OF PRESENT ILLNESS:  Pt is encephelopathic; therefore, this HPI is obtained from chart review. Jamie Mason is a 76 y.o. F with PMH as outlined below. She presented to [facility] ED 8/9 with chronic left calf wound that is infested with maggots. She was apparently in her usual state of health; however, her home health nurse urged her to come to the ED for further evaluation of her wound. She had not had any pain or drainage from the wound. Apparently, she did not even realize that the wound existed until the home health nurse informed her of that.  Imaging did not suggest any evidence of osteomyelitis. She was started on vancomycin, ciprofloxacin, Flagyl (penicillin allergy). Orthopedic surgery evaluated patient in the ED and felt that her care would be better served by general surgery given no bony involvement. General surgery was consulted and will see patient in a.m. 8/10.  She was evaluated by the hospitalist team and was admitted; however, before leaving the emergency department, she had worsening respiratory distress. She was started on BiPAP but ultimately failed and required intubation. PCCM was subsequently called to admit patient to the ICU.   PAST MEDICAL HISTORY :  She  has a past medical history of Atrial flutter with rapid ventricular response (HCC) (07/11/2016); CAD (coronary artery disease) (05/04/2011); Diabetes mellitus without complication (HCC); Hyperlipidemia; Hypertension; Lumbar disc disease; Morbid Obesity (05/02/2011); and Non-ST elevation myocardial infarction (NSTEMI), initial episode of care [facility]) (05/02/2011).  PAST SURGICAL HISTORY: She  has a past surgical history that includes none; left heart catheterization with coronary angiogram (N/A, 05/03/2011); Cardiac  catheterization (N/A, 07/11/2016); and Cardioversion (N/A, 07/13/2016).  Allergies  Allergen Reactions  . Lisinopril Swelling    Tongue swelling   . Ace Inhibitors Other (See Comments)    unknown  . Codeine Itching and Rash  . Penicillins Itching and Rash    Has patient had a PCN reaction causing immediate rash, facial/tongue/throat swelling, SOB or lightheadedness with hypotension: unknown Has patient had a PCN reaction causing severe rash involving mucus membranes or skin necrosis: unknown Has patient had a PCN reaction that required hospitalization: no Has patient had a PCN reaction occurring within the last 10 years: no If all of the above answers are "NO", then may proceed with Cephalosporin use.  . Tomato     Itchy rash    No current facility-administered medications on file prior to encounter.    Current Outpatient Prescriptions on File Prior to Encounter  Medication Sig  . amiodarone (PACERONE) 200 MG tablet Take 1 tablet (200 mg total) by mouth daily.  Marland Kitchen apixaban (ELIQUIS) 5 MG TABS tablet Take 1 tablet (5 mg total) by mouth 2 (two) times daily.  Marland Kitchen aspirin (ASPIR-LOW) 81 MG EC tablet Take 81 mg by mouth daily. Swallow whole.  Marland Kitchen atorvastatin (LIPITOR) 40 MG tablet Take 1 tablet (40 mg total) by mouth daily.  . carvedilol (COREG) 25 MG tablet Take 1 tablet (25 mg total) by mouth 2 (two) times daily with a meal.  . diltiazem (CARTIA XT) 120 MG 24 hr capsule Take 1 capsule (120 mg total) by mouth daily. (Patient taking differently: Take 240 mg by mouth daily. )  . furosemide (LASIX) 40 MG tablet Take 1 tablet (40 mg total) by mouth 2 (two)  times daily. Take 2 tablets by mouth every morning and 1 tablet by mouth every afternoon (Patient taking differently: Take 40 mg by mouth 2 (two) times daily. )  . gabapentin (NEURONTIN) 100 MG capsule Take 2 capsules (200 mg total) by mouth 3 (three) times daily.  . hydrALAZINE (APRESOLINE) 50 MG tablet Take 1 tablet (50 mg total) by mouth 3  (three) times daily.  . metFORMIN (GLUCOPHAGE) 500 MG tablet Take 1 tablet (500 mg total) by mouth 2 (two) times daily with a meal.  . albuterol (PROVENTIL HFA;VENTOLIN HFA) 108 (90 Base) MCG/ACT inhaler Inhale 2 puffs into the lungs every 6 (six) hours as needed for wheezing or shortness of breath.  . hydrocerin (EUCERIN) CREA Apply to bilateral LEs after cleansing on Mondays and Thursdays (prior to [employer] application).  . polyethylene glycol powder (GLYCOLAX/MIRALAX) powder MIX 1 CAPFUL (17GM) IN LIQUID AND DRINK EVERY DAY   FAMILY HISTORY:  Her indicated that her mother is deceased. She indicated that her father is deceased. She indicated that two of her four sisters are alive. She indicated that only one of her two brothers is alive. She indicated that her maternal grandmother is deceased. She indicated that her maternal grandfather is deceased. She indicated that her paternal grandmother is deceased. She indicated that her paternal grandfather is deceased.   SOCIAL HISTORY: She  reports that she quit smoking about 5 years ago. Her smoking use included Cigarettes. She has a 25.00 pack-year smoking history. She has never used smokeless tobacco. She reports that she does not drink alcohol or use drugs.  REVIEW OF SYSTEMS:   Review of Systems  Unable to perform ROS: Critical illness   VITAL SIGNS: BP 140/70   Pulse 93   Temp 98.6 F (37 C)   Resp 16   Ht 5\' 8"  (1.727 m)   Wt (!) 148.8 kg (328 lb)   SpO2 100%   BMI 49.87 kg/m   VENTILATOR SETTINGS: Vent Mode: PRVC FiO2 (%):  [100 %] 100 % Set Rate:  [14 bmp] 14 bmp Vt Set:  [510 mL] 510 mL PEEP:  [5 cmH20] 5 cmH20  INTAKE / OUTPUT: I/O last 3 completed shifts: In: 200 [IV Piggyback:200] Out: -   PHYSICAL EXAMINATION: General: Morbidly obese elderly AAF, Intubated and Sedated Neuro: RASS -2, awakening to touch or loud voice, not obeying commands, is moving all extremities. PERRL HEENT: OP clear, MM moist, 7.5 ETT in place   Cardiovascular: Irreg irreg with rate in 80's Lungs: Loud rhonchi b/l Abdomen: Obese soft NTND Musculoskeletal: Tight nonpitting BLE edema and woody changes of BLE skin.  Skin: small 1x2cm relatively shallow ulceration on left anterior lower leg with clean pink base but some surrounding purulent material; few smaller surrounding pustules  LABS:  BMET  Recent Labs Lab 01/19/17 1535  NA 143  K 3.7  CL 107  CO2 28  BUN 31*  CREATININE 1.76*  GLUCOSE 122*   Electrolytes  Recent Labs Lab 01/19/17 1535  CALCIUM 9.0   CBC  Recent Labs Lab 01/19/17 1535  WBC 9.6  HGB 8.2*  HCT 24.7*  PLT 332   Coag's No results for input(s): APTT, INR in the last 168 hours.  Sepsis Markers No results for input(s): LATICACIDVEN, PROCALCITON, O2SATVEN in the last 168 hours.  ABG No results for input(s): PHART, PCO2ART, PO2ART in the last 168 hours.  Liver Enzymes No results for input(s): AST, ALT, ALKPHOS, BILITOT, ALBUMIN in the last 168 hours.  Cardiac Enzymes  No results for input(s): TROPONINI, PROBNP in the last 168 hours.  Glucose No results for input(s): GLUCAP in the last 168 hours.  Imaging Dg Tibia/fibula Left  Result Date: 01/19/2017 CLINICAL DATA:  Nonhealing wound of distal left lower leg. EXAM: LEFT TIBIA AND FIBULA - 2 VIEW COMPARISON:  06/26/2016 FINDINGS: There is no evidence of acute fracture, subluxation or dislocation. No radiographic evidence of osteomyelitis noted. Degenerative changes in the knee noted. Calcaneal spur is again noted. No radiopaque foreign bodies are identified. IMPRESSION: No evidence of acute bony abnormality. Electronically Signed   By: Harmon Pier M.D.   On: 01/19/2017 15:40   STUDIES:  Left tibia x-ray 8/9 > no evidence of acute process.  CULTURES: Blood 8/9 >  Left calf wound 8/9 >   ANTIBIOTICS: Vanco 8/9 >  Ceftriaxone 8/9 >  Flagyl 8/9 >   SIGNIFICANT EVENTS: 8/9 > admit. Developed respiratory distress while in ED and  failed BiPAP. Ultimately required intubation.  LINES/TUBES: ETT 8/9 >  20G and 22G PIV's in LUE Foley catheter  DISCUSSION: 76 y.o. F with PMH of DM, admitted 8/9 with LLE wound infested with maggots. Developed respiratory distress while in ED and failed BiPAP. Ultimately required intubation.   ASSESSMENT / PLAN:  INFECTIOUS 1. LLE wound - infested with maggots.  - afebrile and no leukocytosis so not suspicious for sepsis; will check lactate and procal   - wound care has already cleaned the wound and flushed away the maggots prior to my examination - general surgery has been consulted by ER; no signs of osteo on xray though.  - Continue empiric abx (vanc, ceftriaxone, flagyl). - follow-up cultures  PULMONARY 1. Acute hypercapneic and Acute Hypoxic respiratory failure; Diastolic CHF exacerbation: - developed respiratory distress in ED and was placed on BiPAP. However, she failed and ultimately required intubation. - continue mechanical ventilation - ABG showed hypercapnea (7.30/53/282/25/99%); increased RR from 14 to 18. Decreased FIO2 from 100% to 40% - repeat ABG in AM.  - SBT in a.m. if mental status allows. - Bronchial hygiene. - CXR on my review shows increased interstitial markings and b/l infiltrates more consistent with alveolar filling from pulmonary edema than from pneumonia - Start Lasix 60mg  IV BID - check BNP - repeat CXR in AM - GI and DVT prophylaxis   2. COPD exacerbation: reportedly was wheezing prior to intubation, but at this time has only rhonchi on my exam. Feel this is more likely CHF than COPD exacerbation. - received Solumedrol 125mg  IV in ER; continue 60mg  IV q6hrs - start pulmicort and brovana nebs BID and duonebs q6hrs - try to reach family to see what medications she takes for her COPD as none are listed here.   CARDIOVASCULAR 1. Hx Aflutter: currently rate controlled - continue Eliquis - continue amiodarone; hold dilt and carvedilol at this time  as BP is soft  2. Hx HTN, CAD, HTN, HLD: currently soft BP - holding carvedilol, diltiazem, and hydralazine - continue home medicications of ASA and Atorvastatin  3. Grade 2 Diastolic CHF (Echo from Jan 2018 with EF 60-65%, G2DD), currently in CHF exacerbation: - diurese as discussed above.  - obtain new TTE in AM  RENAL 1. CKD:  - creatinine 1.76, with baseline 1.53-1.72; therefore she is at or near her baseline at this time - urine looks cloudy in foley; obtain UA - monitor UOP  GASTROINTESTINAL A:   GI prophylaxis. Nutrition. Hx morbid obesity. P:   SUP: pantoprazole. NPO.  HEMATOLOGIC 1.  Anemia - chronic. - Hgb 8.2 from baseline 9-10.5; continue to monitor - Transfuse for Hgb < 7.  ENDOCRINE 1. DM - NPO; SSI. - Hold preadmission metformin  NEUROLOGIC 1. Acute encephalopathy - due to sedation. - Sedation: Fentanyl gtt / Midazolam PRN. - RASS goal: 0 to -1. - Daily WUA. - Hold preadmission gabapentin.   60 minutes critical care time  Milana ObeyKathleen Summit Borchardt, MD Pulmonary & Critical Care

## 2017-01-19 NOTE — Telephone Encounter (Signed)
TELL DR. Rogelia BogaBUTCHER SHE HAS GAINED, CURRENT WEIGHT [phone]

## 2017-01-20 ENCOUNTER — Other Ambulatory Visit: Payer: Self-pay | Admitting: *Deleted

## 2017-01-20 ENCOUNTER — Encounter (HOSPITAL_COMMUNITY): Payer: Self-pay | Admitting: *Deleted

## 2017-01-20 ENCOUNTER — Inpatient Hospital Stay (HOSPITAL_COMMUNITY): Payer: Medicare HMO

## 2017-01-20 DIAGNOSIS — J9601 Acute respiratory failure with hypoxia: Secondary | ICD-10-CM

## 2017-01-20 DIAGNOSIS — I5031 Acute diastolic (congestive) heart failure: Secondary | ICD-10-CM

## 2017-01-20 LAB — CBC
HEMATOCRIT: 28.7 % — AB (ref 36.0–46.0)
HEMOGLOBIN: 9.2 g/dL — AB (ref 12.0–15.0)
MCH: 27.4 pg (ref 26.0–34.0)
MCHC: 32.1 g/dL (ref 30.0–36.0)
MCV: 85.4 fL (ref 78.0–100.0)
Platelets: 332 10*3/uL (ref 150–400)
RBC: 3.36 MIL/uL — ABNORMAL LOW (ref 3.87–5.11)
RDW: 17.1 % — AB (ref 11.5–15.5)
WBC: 15 10*3/uL — ABNORMAL HIGH (ref 4.0–10.5)

## 2017-01-20 LAB — TROPONIN I: Troponin I: <0.03 ng/mL (ref <0.03)

## 2017-01-20 LAB — BLOOD GAS, ARTERIAL
Acid-base deficit: 0.7 mmol/L (ref 0.0–2.0)
BICARBONATE: 25.7 mmol/L (ref 20.0–28.0)
Drawn by: 225631
FIO2: 40
LHR: 18 {breaths}/min
MECHVT: 0.51 mL
O2 SAT: 92.1 %
PATIENT TEMPERATURE: 98.3
PCO2 ART: 54.8 mmHg — AB (ref 32.0–48.0)
PEEP: 5 cmH2O
PH ART: 7.292 — AB (ref 7.350–7.450)
PO2 ART: 74.7 mmHg — AB (ref 83.0–108.0)

## 2017-01-20 LAB — GLUCOSE, CAPILLARY
GLUCOSE-CAPILLARY: 123 mg/dL — AB (ref 65–99)
GLUCOSE-CAPILLARY: 156 mg/dL — AB (ref 65–99)
GLUCOSE-CAPILLARY: 165 mg/dL — AB (ref 65–99)
GLUCOSE-CAPILLARY: 171 mg/dL — AB (ref 65–99)
Glucose-Capillary: 165 mg/dL — ABNORMAL HIGH (ref 65–99)
Glucose-Capillary: 124 mg/dL — ABNORMAL HIGH (ref 65–99)
Glucose-Capillary: 153 mg/dL — ABNORMAL HIGH (ref 65–99)

## 2017-01-20 LAB — BASIC METABOLIC PANEL
ANION GAP: 12 (ref 5–15)
BUN: 30 mg/dL — ABNORMAL HIGH (ref 6–20)
CALCIUM: 8.9 mg/dL (ref 8.9–10.3)
CO2: 26 mmol/L (ref 22–32)
CREATININE: 1.71 mg/dL — AB (ref 0.44–1.00)
Chloride: 106 mmol/L (ref 101–111)
GFR, EST AFRICAN AMERICAN: 33 mL/min — AB (ref >60)
GFR, EST NON AFRICAN AMERICAN: 28 mL/min — AB (ref >60)
GLUCOSE: 170 mg/dL — AB (ref 65–99)
Potassium: 4 mmol/L (ref 3.5–5.1)
Sodium: 144 mmol/L (ref 135–145)

## 2017-01-20 LAB — LACTIC ACID, PLASMA
Lactic Acid, Venous: 2 mmol/L (ref 0.5–1.9)
Lactic Acid, Venous: 2.1 mmol/L (ref 0.5–1.9)

## 2017-01-20 LAB — TRIGLYCERIDES: TRIGLYCERIDES: 81 mg/dL (ref <150)

## 2017-01-20 LAB — MRSA PCR SCREENING: MRSA BY PCR: INVALID — AB

## 2017-01-20 LAB — PROCALCITONIN
Procalcitonin: 0.18 ng/mL
Procalcitonin: 0.63 ng/mL

## 2017-01-20 LAB — PHOSPHORUS: PHOSPHORUS: 4.7 mg/dL — AB (ref 2.5–4.6)

## 2017-01-20 LAB — MAGNESIUM: Magnesium: 2 mg/dL (ref 1.7–2.4)

## 2017-01-20 MED ORDER — CHLORHEXIDINE GLUCONATE 0.12% ORAL RINSE (MEDLINE KIT)
15.0000 mL | Freq: Two times a day (BID) | OROMUCOSAL | Status: DC
  Administered 2017-01-20 – 2017-01-24 (×9): 15 mL via OROMUCOSAL
  Filled 2017-01-20: qty 15

## 2017-01-20 MED ORDER — METOPROLOL TARTRATE 5 MG/5ML IV SOLN
5.0000 mg | INTRAVENOUS | Status: AC
  Administered 2017-01-20: 5 mg via INTRAVENOUS
  Filled 2017-01-20: qty 5

## 2017-01-20 MED ORDER — HYDRALAZINE HCL 20 MG/ML IJ SOLN
10.0000 mg | INTRAMUSCULAR | Status: DC | PRN
  Administered 2017-01-20 – 2017-01-22 (×3): 10 mg via INTRAVENOUS
  Filled 2017-01-20: qty 1
  Filled 2017-01-20: qty 0.5
  Filled 2017-01-20 (×2): qty 1

## 2017-01-20 MED ORDER — ORAL CARE MOUTH RINSE
15.0000 mL | Freq: Four times a day (QID) | OROMUCOSAL | Status: DC
  Administered 2017-01-20 – 2017-01-24 (×13): 15 mL via OROMUCOSAL

## 2017-01-20 MED ORDER — FUROSEMIDE 10 MG/ML IJ SOLN
80.0000 mg | Freq: Two times a day (BID) | INTRAMUSCULAR | Status: DC
  Administered 2017-01-20 (×2): 80 mg via INTRAVENOUS
  Filled 2017-01-20 (×2): qty 8

## 2017-01-20 MED ORDER — HYDRALAZINE HCL 50 MG PO TABS
50.0000 mg | ORAL_TABLET | Freq: Three times a day (TID) | ORAL | Status: DC
  Administered 2017-01-20 – 2017-01-21 (×3): 50 mg
  Filled 2017-01-20 (×4): qty 1

## 2017-01-20 MED ORDER — HYDROXYZINE HCL 10 MG PO TABS
10.0000 mg | ORAL_TABLET | Freq: Four times a day (QID) | ORAL | Status: DC | PRN
  Administered 2017-01-20 – 2017-01-21 (×3): 10 mg via ORAL
  Filled 2017-01-20 (×3): qty 1

## 2017-01-20 MED ORDER — DILTIAZEM 12 MG/ML ORAL SUSPENSION
60.0000 mg | Freq: Four times a day (QID) | ORAL | Status: DC
  Administered 2017-01-20 – 2017-01-21 (×6): 60 mg
  Filled 2017-01-20 (×6): qty 6

## 2017-01-20 MED ORDER — CARVEDILOL 25 MG PO TABS
25.0000 mg | ORAL_TABLET | Freq: Two times a day (BID) | ORAL | Status: DC
  Administered 2017-01-20 – 2017-01-21 (×4): 25 mg
  Filled 2017-01-20: qty 1
  Filled 2017-01-20 (×4): qty 2

## 2017-01-20 MED ORDER — IPRATROPIUM-ALBUTEROL 0.5-2.5 (3) MG/3ML IN SOLN
3.0000 mL | Freq: Three times a day (TID) | RESPIRATORY_TRACT | Status: DC
  Administered 2017-01-21 – 2017-01-23 (×8): 3 mL via RESPIRATORY_TRACT
  Filled 2017-01-20 (×8): qty 3

## 2017-01-20 NOTE — Progress Notes (Addendum)
Initial Nutrition Assessment  DOCUMENTATION CODES:   Morbid obesity  INTERVENTION:  - If TF warranted: recommend Vital High Protein @ 50 mL/hr with 60 mL Prostat BID. This regimen will provide 1600 kcal (101% maximum estimated kcal need), 165 grams of protein (103% estimated protein need), 1003 mL free water, and 1160 mg Phos.  NUTRITION DIAGNOSIS:   Increased nutrient needs related to wound healing as evidenced by estimated needs.  GOAL:   Provide needs based on ASPEN/SCCM guidelines  MONITOR:   Vent status, Weight trends, Labs, Skin, I & O's  REASON FOR ASSESSMENT:   Ventilator, Low Braden  ASSESSMENT:   76 y.o. female  with past medical history significant for hypertension, diabetes type 2, hyperlipidemia, coronary artery disease, atrial fibrillation and flutter and chronic lower extremity edema who who comes in for a lower extremity wound. Patient states she was in her usual state of health and feels at baseline but came in today at the urging of her home nurse. Patient states that her nurse said she has a wound that is covered with maggots that needs to get taken care of. Patient herself has no acute complaints. She does not have any pain in the leg. She denies even knowing that the wound was there until the nurse told her that it was.  BMI indicates obesity. She had progressive respiratory distress/failure and was intubated in the ED following failed trial of BiPAP; OGT placed at that time. No family/visitors present to provide information from PTA. Physical assessment shows no muscle or fat wasting, mild edema throughout upper and lower body. Per chart review, weight increased from 316 lb on 6/21 to 323 lb on 7/12. Weight again increased from 323 lb on 7/12 to 331 lb on 8/10. Will continue to monitor weight trends closely. General Surgery following pt and PA note from this AM states no indication for wound debridement at this time.   Patient is currently intubated on ventilator  support MV: 9.7 L/min Temp (24hrs), Avg:97.5 F (36.4 C), Min:96.8 F (36 C), Max:98.8 F (37.1 C) Propofol: none BP: 182/62 and MAP: 104   Medications reviewed; 80 mg IV Lasix BID, sliding scale Novolog, 125 mg Solu-medrol x1 dose yesterday, 40 mg IV Protonix/day.  Labs reviewed; CBGs: 165 mg/dL x2 and 540156 mg/dL this AM, BUN: 30 mg/dL, creatinine: 9.811.71 mg/dL, Phos: 4.7 mg/dL, GFR: 33 mmol/L.   Diet Order:  Diet NPO time specified  Skin:  Wound (see comment) (Stage 3 L buttocks, Stage 2 L thigh pressure injuries; L calf venous statsis ulcer)  Last BM:  PTA/unknown  Height:   Ht Readings from Last 1 Encounters:  01/19/17 5\' 8"  (1.727 m)    Weight:   Wt Readings from Last 1 Encounters:  01/20/17 (!) 331 lb 12.7 oz (150.5 kg)    Ideal Body Weight:  63.64 kg  BMI:  Body mass index is 50.45 kg/m.  Estimated Nutritional Needs:   Kcal:  1400-1590 (22-25 kcal/kg IBW)  Protein:  160 grams (2.5 grams/kg IBW)  Fluid:  >/= 2 L/day  EDUCATION NEEDS:   No education needs identified at this time    Trenton GammonJessica Latrisha Coiro, MS, RD, LDN, CNSC Inpatient Clinical Dietitian Pager # [phone] After hours/weekend pager # [phone]

## 2017-01-20 NOTE — Consult Note (Signed)
WOC consulted for LE wound, however I have reviewed patient's chart. General surgery has evaluated this patient today and written wound care orders. For this reason I will not consult on this patient.    Re consult if needed, will not follow at this time. Thanks  Aurianna Earlywine M.D.C. Holdingsustin MSN, RN,CWOCN, CNS, CWON-AP [phone])

## 2017-01-20 NOTE — Telephone Encounter (Signed)
admitted

## 2017-01-20 NOTE — Care Management Note (Signed)
Case Management Note  Patient Details  Name: Jamie Mason MRN: [id number] Date of Birth: 06/05/1941  Subjective/Objective:     Leg wound VDRF               Action/Plan: Date:  January 20, 2017 Chart reviewed for concurrent status and case management needs. Will continue to follow patient progress. Discharge Planning: following for needs Expected discharge date: [id number] Marcelle SmilingRhonda Davis, BSN, Cut OffRN3, [place]CCM   [phone]  Expected Discharge Date:   (unknown)               Expected Discharge Plan:  Home/Self Care  In-House Referral:     Discharge planning Services  CM Consult  Post Acute Care Choice:    Choice offered to:     DME Arranged:    DME Agency:     HH Arranged:    HH Agency:     Status of Service:  In process, will continue to follow  If discussed at Long Length of Stay Meetings, dates discussed:    Additional Comments:  Golda AcreDavis, Rhonda Lynn, RN 01/20/2017, 8:27 AM

## 2017-01-20 NOTE — Patient Outreach (Signed)
[facility] Care Management  01/20/2017  Arvella NighGloria C Kister 05/16/1941 [id number]   Call placed to H. Alfredo BachAtkinson, primary MD office, update regarding home visit and current status provided.  Member is now intubated in the ICU.  Hospital liaison notified, this care manager will follow up once member discharged.  Kemper DurieMonica Mc Hollen, CaliforniaRN, MSN [facility] Care Management  [facility] Care Manager [phone]

## 2017-01-20 NOTE — Progress Notes (Signed)
Brief: Jamie Mason is a 76 y.o. 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  She was evaluated by the hospitalist & surgical teams and was admitted; however, before leaving the emergency department, she had worsening respiratory distress. She was started on BiPAP but ultimately failed and required intubation. PCCM was subsequently called to admit patient to the ICU.  Subjective  More awake Objective BP (!) 151/58 (BP Location: Right Arm)   Pulse 89   Temp 98.3 F (36.8 C) (Axillary)   Resp 20   Ht 5\' 8"  (1.727 m)   Wt (!) 331 lb 12.7 oz (150.5 kg)   SpO2 99%   BMI 50.45 kg/m   Intake/Output Summary (Last 24 hours) at 01/20/17 [id number] Last data filed at 01/20/17 0700  Gross per 24 hour  Intake          1116.35 ml  Output             1165 ml  Net           -48.65 ml  General appearance:  76 Year old  female, chronically ill appearing, fluctuates between agitated and sedated triggering apnea alarm on vent Eyes: anicteric sclerae, moist conjunctivae; PERRL, EOMI bilaterally. Mouth:  membranes and no mucosal ulcerations; orally intubated Neck: Trachea midline; neck supple, no JVD Lungs/chest: scattered rhonchi, with normal respiratory effort and no intercostal retractions; pulling Vts in excess of 1.2 liters CV:AF on tele w/ CVR. No murmur.   Abdomen: Soft, non-tender; no masses or HSM Extremities: chronic LE edema. The left leg/ankle area is wrapped in kerlex  Skin: Normal temperature, turgor and texture; no rash, ulcers or subcutaneous nodules Psych: Appropriate affect, alert and oriented to person, place and time CBC Recent Labs     01/19/17  1535  01/20/17  0330  WBC  9.6  15.0*  HGB  8.2*  9.2*  HCT  24.7*  28.7*  PLT  332  332    Coag's No results for input(s): APTT, INR in the last 72 hours.  BMET Recent Labs     01/19/17  1535  01/20/17  0717  NA  143  144  K  3.7  4.0  CL  107  106  CO2  28  26  BUN  31*  30*   CREATININE  1.76*  1.71*  GLUCOSE  122*  170*    Electrolytes Recent Labs     01/19/17  1535  01/19/17  2246  01/20/17  0717  CALCIUM  9.0   --   8.9  MG   --   2.0  2.0  PHOS   --    --   4.7*    Sepsis Markers Recent Labs     01/19/17  2246  01/20/17  0717  PROCALCITON  0.18  0.63    ABG Recent Labs     01/19/17  2200  01/20/17  0448  PHART  7.303*  7.292*  PCO2ART  53.1*  54.8*  PO2ART  282*  74.7*    Liver Enzymes No results for input(s): AST, ALT, ALKPHOS, BILITOT, ALBUMIN in the last 72 hours.  Cardiac Enzymes Recent Labs     01/19/17  2246  01/20/17  0330  TROPONINI  <0.03  <0.03    Glucose Recent Labs     01/20/17  0059  01/20/17  0429  01/20/17  0753  GLUCAP  165*  165*  156*  Imaging Dg Tibia/fibula Left  Result Date: 01/19/2017 CLINICAL DATA:  Nonhealing wound of distal left lower leg. EXAM: LEFT TIBIA AND FIBULA - 2 VIEW COMPARISON:  06/26/2016 FINDINGS: There is no evidence of acute fracture, subluxation or dislocation. No radiographic evidence of osteomyelitis noted. Degenerative changes in the knee noted. Calcaneal spur is again noted. No radiopaque foreign bodies are identified. IMPRESSION: No evidence of acute bony abnormality. Electronically Signed   By: Harmon Pier M.D.   On: 01/19/2017 15:40   Dg Chest Port 1 View  Result Date: 01/20/2017 CLINICAL DATA:  Respiratory failure EXAM: PORTABLE CHEST 1 VIEW COMPARISON:  01/19/2017 FINDINGS: Endotracheal and NG tubes are stable. Diffuse extensive bilateral airspace disease is stable. There is lucency projecting over the right hilum. Developing pneumothorax in a supine patient is not excluded. Linear atelectasis in left mid lung is stable. Cardiomegaly. IMPRESSION: Stable cardiomegaly and bilateral airspace disease. Stable support apparatus Possible focal pneumothorax on the right. Attention on follow-up studies is recommended. Electronically Signed   By: Jolaine Click M.D.   On: 01/20/2017  07:46   Dg Chest Portable 1 View  Result Date: 01/19/2017 CLINICAL DATA:  Retraction of endotracheal tube. EXAM: PORTABLE CHEST 1 VIEW COMPARISON:  Film earlier today at 2046 hours FINDINGS: Endotracheal tube tip now projects approximately 4.5 cm above the carina. Lungs demonstrate some improvement in pulmonary edema since the prior study with moderate edema remaining. Orogastric tube extends below the diaphragm. No significant pleural fluid identified. IMPRESSION: Endotracheal tube tip approximately 4.5 cm above the carina. Decrease in pulmonary edema since the prior study. Electronically Signed   By: Irish Lack M.D.   On: 01/19/2017 22:16   Dg Chest Portable 1 View  Result Date: 01/19/2017 CLINICAL DATA:  Respiratory failure and status post intubation. EXAM: PORTABLE CHEST 1 VIEW COMPARISON:  10/27/2016 FINDINGS: Endotracheal tube present with the tip approximately 3.5 cm above the carina. Nasogastric tube extends below the diaphragm. The heart is mildly enlarged. Lungs show diffuse pulmonary edema. No pneumothorax or large pleural effusions. IMPRESSION: Endotracheal tube tip is approximately 3.5 cm above the carina. Lungs demonstrate diffuse pulmonary edema. Electronically Signed   By: Irish Lack M.D.   On: 01/19/2017 21:18   Dg Abd Portable 1 View  Result Date: 01/19/2017 CLINICAL DATA:  Orogastric tube placement. EXAM: PORTABLE ABDOMEN - 1 VIEW COMPARISON:  None. FINDINGS: Orogastric tube extends into the stomach with the tip likely located in the body of the stomach. Other visualized bowel gas is unremarkable. IMPRESSION: Orogastric tube extends into the stomach with the tip located in the expected region of the body of the stomach. Electronically Signed   By: Irish Lack M.D.   On: 01/19/2017 21:21   Culture data BCX2 8/9>>> Sputum 8/9>>  ABX Rocephin 8/9>> Flagyl 8/9>>> vanc 8/9>>>  Impression/Plan  Pulmonary A: Acute Hypoxic Respiratory failure in setting of presumed  Flash pulmonary edema Right mid-lung lucency r/o PTX-->doubt H/o "COPD" PCXR personally reviewed: although aeration is a little better still has significant diffuse airspace disease. She also has a lucency noted over the right mid-lung. This could be PTX but clinically doubt P:  Escalate diuretics BP control See cardiac section Cont Brovana and budesonide  Cont SBT; may be able to extubate later today/this am.  Would have BIPAP at bedside post-extubation. 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  P: Cont SUP w/ PPI  HEME A: Chronic anemia  P: Trend CBC Transfuse per protocol  Cont DOAC  ENDOCRINE A: DM P: ssi Holding orals   Infectious disease.  A: Left lower extremity leg wound w/ maggot infestation  -seen by wound, Ortho and general surgery. No surgery indicated. No Osteo.  P: Wound care as outlined.  Day # 2 rocephin, flagyl  and vanc. In light of negative cultures   Neuro A: Sedated.  P Dc sedating gtts Supportive care  Discussion Flash edema. Need to eval echo. CEs neg. Hope extubate today. Taper abx asap Not convinced she is actively infected.   My cct 34 min.   Simonne Martinet ACNP-BC [facility] Pulmonary/Critical Care Pager # [phone] OR # [phone] if no answer

## 2017-01-20 NOTE — Procedures (Signed)
Extubation Procedure Note  Patient Details:   Name: Arvella NighGloria C Underdown DOB: 04/05/1941 MRN: [id number]   Airway Documentation:  Airway (Active)    Evaluation  O2 sats: stable throughout Complications: No apparent complications Patient did tolerate procedure well. Bilateral Breath Sounds: Diminished   Yes  Suzan GaribaldiCraddock, Carrera Kiesel Ann 01/20/2017, 10:55 AM

## 2017-01-20 NOTE — Progress Notes (Signed)
Inpatient Diabetes Program Recommendations  AACE/ADA: New Consensus Statement on Inpatient Glycemic Control (2015)  Target Ranges:  Prepandial:   less than 140 mg/dL      Peak postprandial:   less than 180 mg/dL (1-2 hours)      Critically ill patients:  140 - 180 mg/dL   Lab Results  Component Value Date   GLUCAP 124 (H) 01/20/2017   HGBA1C 6.8 10/06/2016    Review of Glycemic Control  Diabetes history: DM2 Outpatient Diabetes medications: metformin 500 mg bid Current orders for Inpatient glycemic control: Novolog 0-15 units Q4H  HgbA1C 6.8% on 10/06/2016. Needs update. Blood sugars controlled since hospitalization.   Inpatient Diabetes Program Recommendations:    HgbA1C to assess glycemic control PTA. Change Novolog 0-15 units to tidwc and hs when diet is advanced to CHO mod.  Will follow.  Thank you. Jamie Mason, RD, LDN, CDE Inpatient Diabetes Coordinator [phone]

## 2017-01-20 NOTE — Progress Notes (Signed)
110ml Fentanyl wasted in sink, witnessed by Marijo SanesKara Brooks, RN

## 2017-01-20 NOTE — Progress Notes (Signed)
CRITICAL VALUE ALERT  Critical Value:  Lactic Acid 2.0   Date & Time Notied: 01/20/17 0427   Provider Notified: Pola CornELINK RN Merci   Orders Received/Actions taken: Will watch for new orders. Patient is receiving antibiotic coverage at this time. Will continue to assess.

## 2017-01-20 NOTE — Progress Notes (Signed)
eLink Physician-Brief Progress Note Patient Name: Arvella NighGloria C Osley DOB: 01/07/1941 MRN: [id number]   Date of Service  01/20/2017  HPI/Events of Note  Patient extubated earlier today. Patient requests a diet. Bedside nurse reports that the patient tolerated sips and chips without an issue.  Hx of DM and CHF.  eICU Interventions  Will order: 1. Carb modified Clear liquid diet. Fluid restrict to 1800 mL/day.     Intervention Category Intermediate Interventions: Other:  Tamaria Dunleavy Dennard Nipugene 01/20/2017, 3:20 PM

## 2017-01-20 NOTE — Consult Note (Signed)
Reason for Consult:  Open wound with maggots left calf Referring Physician:  Dr. Hebert Soho  Jamie Mason is an 76 y.o. female.  HPI: Pt was sent to the ED after home health nurse found maggots in the left calf wound.  In the ED she developed acute respiratory distress, was placed on BIPAP, continued to spiral down and was intubated.  PCCM assumed care.  She was admitted to the ICU with LLE wound infestation, acute hypercapnic/hypoxic respiratory failure, DCHF, COPD.  Work up since admit to the ED show: she has been afebrile, her BP has been border line high. She has a PH of 7.29, PCO2 53.8, PO2 74.7, HCO3 25 this AM. Creatinine was 1.76 last PM, BNP 427, troponin <0.03 x 2, WBC has gfone from 9.6 to 15K since admit, H/H OK, UA with TNTC RBC and 6-30 WBC, negative nitrate; Films show:  L tib/fib: no evidence of acute bony abnormality, CXR shows diffuse pulmonary edema with endotracheal tube in place  We are ask to see about her Left calf wound.  PAST MEDICAL HISTORY :  She  has a past medical history of Atrial flutter with rapid ventricular response ([place]) (07/11/2016); CAD (coronary artery disease) (05/04/2011); Diabetes mellitus without complication ([place]); Hyperlipidemia; Hypertension; Lumbar disc disease; Morbid Obesity (05/02/2011); and Non-ST elevation myocardial infarction (NSTEMI), initial episode of care [facility]) (05/02/2011).   Past Medical History:  Diagnosis Date  . Atrial flutter with rapid ventricular response ([place]) 07/11/2016  . CAD (coronary artery disease) 05/04/2011  . Diabetes mellitus without complication ([place])   . Diastolic heart failure ([place]) 06/2016   EF 60-65%, G2DD  . Hyperlipidemia   . Hypertension   . Lumbar disc disease   . Morbid Obesity 05/02/2011  . Non-ST elevation myocardial infarction (NSTEMI), initial episode of care [facility]) 05/02/2011    Past Surgical History:  Procedure Laterality Date  . CARDIAC CATHETERIZATION N/A 07/11/2016   Procedure: Left Heart  Cath and Coronary Angiography;  Surgeon: Nelva Bush, MD;  Location: [facility] LAB;  Service: Cardiovascular;  Laterality: N/A;  . CARDIOVERSION N/A 07/13/2016   Procedure: CARDIOVERSION;  Surgeon: Josue Hector, MD;  Location: [facility] ENDOSCOPY;  Service: Cardiovascular;  Laterality: N/A;  . LEFT HEART CATHETERIZATION WITH CORONARY ANGIOGRAM N/A 05/03/2011   Procedure: LEFT HEART CATHETERIZATION WITH CORONARY ANGIOGRAM;  Surgeon: Jacolyn Reedy, MD;  Location: [facility] CATH LAB;  Service: Cardiovascular;  Laterality: N/A;  . none      Family History  Problem Relation Age of Onset  . Cancer Father   . Stroke Mother     Social History:  reports that she quit smoking about 5 years ago. Her smoking use included Cigarettes. She has a 25.00 pack-year smoking history. She has never used smokeless tobacco. She reports that she does not drink alcohol or use drugs.  Allergies:  Allergies  Allergen Reactions  . Lisinopril Swelling    Tongue swelling   . Ace Inhibitors Other (See Comments)    unknown  . Codeine Itching and Rash  . Penicillins Itching and Rash    Has patient had a PCN reaction causing immediate rash, facial/tongue/throat swelling, SOB or lightheadedness with hypotension: unknown Has patient had a PCN reaction causing severe rash involving mucus membranes or skin necrosis: unknown Has patient had a PCN reaction that required hospitalization: no Has patient had a PCN reaction occurring within the last 10 years: no If all of the above answers are "NO", then may proceed with Cephalosporin use.  Marland Kitchen  Tomato     Itchy rash    Medications:  Prior to Admission:  Prescriptions Prior to Admission  Medication Sig Dispense Refill Last Dose  . amiodarone (PACERONE) 200 MG tablet Take 1 tablet (200 mg total) by mouth daily. 90 tablet 3 01/18/2017 at Unknown time  . apixaban (ELIQUIS) 5 MG TABS tablet Take 1 tablet (5 mg total) by mouth 2 (two) times daily. 180 tablet 3 01/18/2017 at 2030  .  aspirin (ASPIR-LOW) 81 MG EC tablet Take 81 mg by mouth daily. Swallow whole.   01/18/2017 at Unknown time  . atorvastatin (LIPITOR) 40 MG tablet Take 1 tablet (40 mg total) by mouth daily. 90 tablet 3 01/18/2017 at Unknown time  . carvedilol (COREG) 25 MG tablet Take 1 tablet (25 mg total) by mouth 2 (two) times daily with a meal. 90 tablet 3 01/18/2017 at 2030  . diltiazem (CARTIA XT) 120 MG 24 hr capsule Take 1 capsule (120 mg total) by mouth daily. (Patient taking differently: Take 240 mg by mouth daily. ) 90 capsule 0 01/18/2017 at Unknown time  . furosemide (LASIX) 40 MG tablet Take 1 tablet (40 mg total) by mouth 2 (two) times daily. Take 2 tablets by mouth every morning and 1 tablet by mouth every afternoon (Patient taking differently: Take 40 mg by mouth 2 (two) times daily. ) 120 tablet 2 01/18/2017 at Unknown time  . gabapentin (NEURONTIN) 100 MG capsule Take 2 capsules (200 mg total) by mouth 3 (three) times daily. 180 capsule 2 01/18/2017 at Unknown time  . hydrALAZINE (APRESOLINE) 50 MG tablet Take 1 tablet (50 mg total) by mouth 3 (three) times daily. 90 tablet 2 01/18/2017 at Unknown time  . metFORMIN (GLUCOPHAGE) 500 MG tablet Take 1 tablet (500 mg total) by mouth 2 (two) times daily with a meal. 180 tablet 3 01/18/2017 at Unknown time  . albuterol (PROVENTIL HFA;VENTOLIN HFA) 108 (90 Base) MCG/ACT inhaler Inhale 2 puffs into the lungs every 6 (six) hours as needed for wheezing or shortness of breath. 1 Inhaler 3 unknown  . hydrocerin (EUCERIN) CREA Apply to bilateral LEs after cleansing on Mondays and Thursdays (prior to [employer] application). 113 g 0 unknown  . polyethylene glycol powder (GLYCOLAX/MIRALAX) powder MIX 1 CAPFUL (17GM) IN LIQUID AND DRINK EVERY DAY 578 g 2 unknown   Scheduled: . amiodarone  200 mg Per Tube Daily  . apixaban  5 mg Oral BID  . arformoterol  15 mcg Nebulization BID  . aspirin  81 mg Per Tube Daily  . atorvastatin  40 mg Per Tube q1800  . budesonide      . budesonide  (PULMICORT) nebulizer solution  0.5 mg Nebulization BID  . chlorhexidine gluconate (MEDLINE KIT)  15 mL Mouth Rinse BID  . fentaNYL (SUBLIMAZE) injection  50 mcg Intravenous Once  . furosemide  60 mg Intravenous Q12H  . insulin aspart  0-15 Units Subcutaneous Q4H  . ipratropium-albuterol  3 mL Nebulization Q6H  . ipratropium-albuterol      . mouth rinse  15 mL Mouth Rinse QID  . methylPREDNISolone (SOLU-MEDROL) injection  60 mg Intravenous Q6H  . pantoprazole (PROTONIX) IV  40 mg Intravenous QHS   Continuous: . sodium chloride 250 mL (01/20/17 0042)  . cefTRIAXone (ROCEPHIN)  IV Stopped (01/19/17 2325)  . fentaNYL infusion INTRAVENOUS 40 mcg/hr (01/20/17 0526)  . metronidazole Stopped (01/20/17 0425)  . vancomycin     PVV:ZSMOLM chloride, albuterol, fentaNYL, hydrALAZINE, midazolam, midazolam Anti-infectives    Start  Dose/Rate Route Frequency Ordered Stop   01/20/17 1400  vancomycin (VANCOCIN) 1,750 mg in sodium chloride 0.9 % 500 mL IVPB     1,750 mg 250 mL/hr over 120 Minutes Intravenous Every 24 hours 01/19/17 1843     01/19/17 2200  cefTRIAXone (ROCEPHIN) 2 g in dextrose 5 % 50 mL IVPB     2 g 100 mL/hr over 30 Minutes Intravenous Every 24 hours 01/19/17 2142     01/19/17 2000  metroNIDAZOLE (FLAGYL) IVPB 500 mg     500 mg 100 mL/hr over 60 Minutes Intravenous Every 8 hours 01/19/17 1808     01/19/17 2000  ceFEPIme (MAXIPIME) 2 g in dextrose 5 % 50 mL IVPB  Status:  Discontinued     2 g 100 mL/hr over 30 Minutes Intravenous Every 12 hours 01/19/17 1843 01/19/17 2124   01/19/17 1815  vancomycin (VANCOCIN) IVPB 1000 mg/200 mL premix  Status:  Discontinued     1,000 mg 200 mL/hr over 60 Minutes Intravenous Every 8 hours 01/19/17 1808 01/19/17 1843   01/19/17 1800  ciprofloxacin (CIPRO) IVPB 400 mg  Status:  Discontinued     400 mg 200 mL/hr over 60 Minutes Intravenous Every 12 hours 01/19/17 1808 01/19/17 1833   01/19/17 1445  ciprofloxacin (CIPRO) IVPB 400 mg     400  mg 200 mL/hr over 60 Minutes Intravenous  Once 01/19/17 1434 01/19/17 1733   01/19/17 1445  vancomycin (VANCOCIN) 2,000 mg in sodium chloride 0.9 % 500 mL IVPB     2,000 mg 250 mL/hr over 120 Minutes Intravenous  Once 01/19/17 1441 01/19/17 1931      Results for orders placed or performed during the hospital encounter of 01/19/17 (from the past 48 hour(s))  CBC with Differential     Status: Abnormal   Collection Time: 01/19/17  3:35 PM  Result Value Ref Range   WBC 9.6 4.0 - 10.5 K/uL   RBC 2.87 (L) 3.87 - 5.11 MIL/uL   Hemoglobin 8.2 (L) 12.0 - 15.0 g/dL   HCT 24.7 (L) 36.0 - 46.0 %   MCV 86.1 78.0 - 100.0 fL   MCH 28.6 26.0 - 34.0 pg   MCHC 33.2 30.0 - 36.0 g/dL   RDW 17.1 (H) 11.5 - 15.5 %   Platelets 332 150 - 400 K/uL   Neutrophils Relative % 77 %   Neutro Abs 7.4 1.7 - 7.7 K/uL   Lymphocytes Relative 15 %   Lymphs Abs 1.4 0.7 - 4.0 K/uL   Monocytes Relative 5 %   Monocytes Absolute 0.5 0.1 - 1.0 K/uL   Eosinophils Relative 3 %   Eosinophils Absolute 0.2 0.0 - 0.7 K/uL   Basophils Relative 0 %   Basophils Absolute 0.0 0.0 - 0.1 K/uL  Basic metabolic panel     Status: Abnormal   Collection Time: 01/19/17  3:35 PM  Result Value Ref Range   Sodium 143 135 - 145 mmol/L   Potassium 3.7 3.5 - 5.1 mmol/L   Chloride 107 101 - 111 mmol/L   CO2 28 22 - 32 mmol/L   Glucose, Bld 122 (H) 65 - 99 mg/dL   BUN 31 (H) 6 - 20 mg/dL   Creatinine, Ser 1.76 (H) 0.44 - 1.00 mg/dL   Calcium 9.0 8.9 - 10.3 mg/dL   GFR calc non Af Amer 27 (L) >60 mL/min   GFR calc Af Amer 32 (L) >60 mL/min    Comment: (NOTE) The eGFR has been calculated using the CKD EPI  equation. This calculation has not been validated in all clinical situations. eGFR's persistently <60 mL/min signify possible Chronic Kidney Disease.    Anion gap 8 5 - 15  Brain natriuretic peptide     Status: Abnormal   Collection Time: 01/19/17  3:35 PM  Result Value Ref Range   B Natriuretic Peptide 427.4 (H) 0.0 - 100.0 pg/mL   Blood gas, arterial     Status: Abnormal   Collection Time: 01/19/17 10:00 PM  Result Value Ref Range   FIO2 100.00    Delivery systems VENTILATOR    Mode PRESSURE REGULATED VOLUME CONTROL    VT 510 mL   LHR 14 resp/min   Peep/cpap 5.0 cm H20   pH, Arterial 7.303 (L) 7.350 - 7.450   pCO2 arterial 53.1 (H) 32.0 - 48.0 mmHg   pO2, Arterial 282 (H) 83.0 - 108.0 mmHg   Bicarbonate 25.5 20.0 - 28.0 mmol/L   Acid-base deficit 0.7 0.0 - 2.0 mmol/L   O2 Saturation 99.1 %   Patient temperature 98.6    Collection site RIGHT RADIAL    Drawn by [phone]    Sample type ARTERIAL DRAW    Allens test (pass/fail) PASS PASS  Magnesium     Status: None   Collection Time: 01/19/17 10:46 PM  Result Value Ref Range   Magnesium 2.0 1.7 - 2.4 mg/dL  Lactic acid, plasma     Status: None   Collection Time: 01/19/17 10:46 PM  Result Value Ref Range   Lactic Acid, Venous 1.2 0.5 - 1.9 mmol/L  Troponin I     Status: None   Collection Time: 01/19/17 10:46 PM  Result Value Ref Range   Troponin I <0.03 <0.03 ng/mL  Procalcitonin - Baseline     Status: None   Collection Time: 01/19/17 10:46 PM  Result Value Ref Range   Procalcitonin 0.18 ng/mL    Comment:        Interpretation: PCT (Procalcitonin) <= 0.5 ng/mL: Systemic infection (sepsis) is not likely. Local bacterial infection is possible. (NOTE)         ICU PCT Algorithm               Non ICU PCT Algorithm    ----------------------------     ------------------------------         PCT < 0.25 ng/mL                 PCT < 0.1 ng/mL     Stopping of antibiotics            Stopping of antibiotics       strongly encouraged.               strongly encouraged.    ----------------------------     ------------------------------       PCT level decrease by               PCT < 0.25 ng/mL       >= 80% from peak PCT       OR PCT 0.25 - 0.5 ng/mL          Stopping of antibiotics                                             encouraged.     Stopping of  antibiotics  encouraged.    ----------------------------     ------------------------------       PCT level decrease by              PCT >= 0.25 ng/mL       < 80% from peak PCT        AND PCT >= 0.5 ng/mL            Continuin g antibiotics                                              encouraged.       Continuing antibiotics            encouraged.    ----------------------------     ------------------------------     PCT level increase compared          PCT > 0.5 ng/mL         with peak PCT AND          PCT >= 0.5 ng/mL             Escalation of antibiotics                                          strongly encouraged.      Escalation of antibiotics        strongly encouraged.   Urinalysis, Routine w reflex microscopic     Status: Abnormal   Collection Time: 01/19/17 11:06 PM  Result Value Ref Range   Color, Urine YELLOW YELLOW   APPearance CLOUDY (A) CLEAR   Specific Gravity, Urine 1.012 1.005 - 1.030   pH 5.0 5.0 - 8.0   Glucose, UA NEGATIVE NEGATIVE mg/dL   Hgb urine dipstick LARGE (A) NEGATIVE   Bilirubin Urine NEGATIVE NEGATIVE   Ketones, ur NEGATIVE NEGATIVE mg/dL   Protein, ur 30 (A) NEGATIVE mg/dL   Nitrite NEGATIVE NEGATIVE   Leukocytes, UA TRACE (A) NEGATIVE   RBC / HPF TOO NUMEROUS TO COUNT 0 - 5 RBC/hpf   WBC, UA 6-30 0 - 5 WBC/hpf   Bacteria, UA RARE (A) NONE SEEN   Squamous Epithelial / LPF 0-5 (A) NONE SEEN   Mucous PRESENT    Hyaline Casts, UA PRESENT    Amorphous Crystal PRESENT    Non Squamous Epithelial 0-5 (A) NONE SEEN  MRSA PCR Screening     Status: Abnormal   Collection Time: 01/20/17 12:44 AM  Result Value Ref Range   MRSA by PCR INVALID RESULTS, SPECIMEN SENT FOR CULTURE (A) NEGATIVE    Comment:        The GeneXpert MRSA Assay (FDA approved for NASAL specimens only), is one component of a comprehensive MRSA colonization surveillance program. It is not intended to diagnose MRSA infection nor to guide or monitor treatment for MRSA  infections.   Glucose, capillary     Status: Abnormal   Collection Time: 01/20/17 12:59 AM  Result Value Ref Range   Glucose-Capillary 165 (H) 65 - 99 mg/dL  Lactic acid, plasma     Status: Abnormal   Collection Time: 01/20/17  3:30 AM  Result Value Ref Range   Lactic Acid, Venous 2.0 (HH) 0.5 - 1.9 mmol/L    Comment: CRITICAL RESULT CALLED TO, READ BACK BY AND VERIFIED  WITH: CHRISTIAN SMITH,RN [phone] @ [place]   Troponin I     Status: None   Collection Time: 01/20/17  3:30 AM  Result Value Ref Range   Troponin I <0.03 <0.03 ng/mL  CBC     Status: Abnormal   Collection Time: 01/20/17  3:30 AM  Result Value Ref Range   WBC 15.0 (H) 4.0 - 10.5 K/uL   RBC 3.36 (L) 3.87 - 5.11 MIL/uL   Hemoglobin 9.2 (L) 12.0 - 15.0 g/dL   HCT 28.7 (L) 36.0 - 46.0 %   MCV 85.4 78.0 - 100.0 fL   MCH 27.4 26.0 - 34.0 pg   MCHC 32.1 30.0 - 36.0 g/dL   RDW 17.1 (H) 11.5 - 15.5 %   Platelets 332 150 - 400 K/uL  Glucose, capillary     Status: Abnormal   Collection Time: 01/20/17  4:29 AM  Result Value Ref Range   Glucose-Capillary 165 (H) 65 - 99 mg/dL  Blood gas, arterial     Status: Abnormal   Collection Time: 01/20/17  4:48 AM  Result Value Ref Range   FIO2 40.00    Delivery systems VENTILATOR    Mode PRESSURE REGULATED VOLUME CONTROL    VT 0.510 mL   LHR 18 resp/min   Peep/cpap 5.0 cm H20   pH, Arterial 7.292 (L) 7.350 - 7.450   pCO2 arterial 54.8 (H) 32.0 - 48.0 mmHg   pO2, Arterial 74.7 (L) 83.0 - 108.0 mmHg   Bicarbonate 25.7 20.0 - 28.0 mmol/L   Acid-base deficit 0.7 0.0 - 2.0 mmol/L   O2 Saturation 92.1 %   Patient temperature 98.3    Collection site RIGHT RADIAL    Drawn by [id number]    Sample type ARTERIAL    Allens test (pass/fail) PASS PASS    Dg Tibia/fibula Left  Result Date: 01/19/2017 CLINICAL DATA:  Nonhealing wound of distal left lower leg. EXAM: LEFT TIBIA AND FIBULA - 2 VIEW COMPARISON:  06/26/2016 FINDINGS: There is no evidence of acute fracture, subluxation  or dislocation. No radiographic evidence of osteomyelitis noted. Degenerative changes in the knee noted. Calcaneal spur is again noted. No radiopaque foreign bodies are identified. IMPRESSION: No evidence of acute bony abnormality. Electronically Signed   By: Margarette Canada M.D.   On: 01/19/2017 15:40   Dg Chest Portable 1 View  Result Date: 01/19/2017 CLINICAL DATA:  Retraction of endotracheal tube. EXAM: PORTABLE CHEST 1 VIEW COMPARISON:  Film earlier today at 2046 hours FINDINGS: Endotracheal tube tip now projects approximately 4.5 cm above the carina. Lungs demonstrate some improvement in pulmonary edema since the prior study with moderate edema remaining. Orogastric tube extends below the diaphragm. No significant pleural fluid identified. IMPRESSION: Endotracheal tube tip approximately 4.5 cm above the carina. Decrease in pulmonary edema since the prior study. Electronically Signed   By: Aletta Edouard M.D.   On: 01/19/2017 22:16   Dg Chest Portable 1 View  Result Date: 01/19/2017 CLINICAL DATA:  Respiratory failure and status post intubation. EXAM: PORTABLE CHEST 1 VIEW COMPARISON:  10/27/2016 FINDINGS: Endotracheal tube present with the tip approximately 3.5 cm above the carina. Nasogastric tube extends below the diaphragm. The heart is mildly enlarged. Lungs show diffuse pulmonary edema. No pneumothorax or large pleural effusions. IMPRESSION: Endotracheal tube tip is approximately 3.5 cm above the carina. Lungs demonstrate diffuse pulmonary edema. Electronically Signed   By: Aletta Edouard M.D.   On: 01/19/2017 21:18   Dg Abd Portable 1 View  Result  Date: 01/19/2017 CLINICAL DATA:  Orogastric tube placement. EXAM: PORTABLE ABDOMEN - 1 VIEW COMPARISON:  None. FINDINGS: Orogastric tube extends into the stomach with the tip likely located in the body of the stomach. Other visualized bowel gas is unremarkable. IMPRESSION: Orogastric tube extends into the stomach with the tip located in the expected  region of the body of the stomach. Electronically Signed   By: Aletta Edouard M.D.   On: 01/19/2017 21:21    Review of Systems  Unable to perform ROS: Intubated   Blood pressure (!) 163/75, pulse 89, temperature 98.3 F (36.8 C), temperature source Axillary, resp. rate 20, height '5\' 8"'  (1.727 m), weight (!) 150.5 kg (331 lb 12.7 oz), SpO2 96 %. Physical Exam  Constitutional:  Obese elderly female intubated and sedated.   HENT:  Head: Normocephalic and atraumatic.  Intubated with OG  Eyes: Right eye exhibits no discharge. Left eye exhibits no discharge. No scleral icterus.  Neck: No JVD present. No tracheal deviation present. No thyromegaly present.  Cardiovascular: Normal rate, regular rhythm and normal heart sounds.   No murmur heard. I did not feel pulses in distal foot  Respiratory: Breath sounds normal. She is in respiratory distress (intubated ). She has no wheezes. She has no rales. She exhibits no tenderness.  Clear anterior exam on Vent  GI: Soft. Bowel sounds are normal. She exhibits no distension and no mass. There is no tenderness. There is no rebound and no guarding.  Musculoskeletal: She exhibits no edema.  Lymphadenopathy:    She has no cervical adenopathy.  Neurological:  Sedated on the ventilator.  She did move her leg some with cleaning the wound.    Skin: Skin is warm and dry. No rash noted. No erythema. No pallor.  Woody skin changes, L shaped ulcer that is superficial.  It measure about 2 x 2 cm if you measure all of it, the main portion is about 1 x 2 cm.  Picture are below.  Psychiatric:  Sedated and on the vent.        Assessment/Plan: Maggots to left ankle/left medial ankle ulcer Acute respiratory failure/hypercapnea/Hypoxia/pulmonary edema COPD exacerbation DCHF CKD AF with RVR - chronic anticoagulation on Eliquis Anemia Type II diabetes Obesity with  Body mass index is 50.   Plan:  No osteomyelitis noted on plain films and the wound which is  superficial is very clean.  I would not expect Osteomyelitis in this setting.  There is no surgical issue here, no debridement is needed.  She just needs local wound care.  I would clean it with soap and water, and dry non-stick dressing.  Local skin care to the woody skin around the ulcer and on both lower legs.  Dopplers would help with evaluation of vascular status.      Deontez Klinke 01/20/2017, 7:21 AM

## 2017-01-20 NOTE — Progress Notes (Signed)
CRITICAL VALUE ALERT  Critical Value:  Lactic acid 2.1  Date & Time Notied:  01/20/2017  Provider Notified: Anders SimmondsPete, Babcock, NP  Orders Received/Actions taken: no new orders

## 2017-01-20 NOTE — Consult Note (Signed)
   [facility]   01/20/2017  [name] 04/03/1941 [id number]    Made aware of hospitalization by [facility].  [facility]. Please see chart review tab the encounters for further patient outreach details.   Currently in ICU. Will continue to follow.   Will make inpatient RNCM aware that [facility].   Raiford NobleAtika Hall, MSN-Ed, RN,BSN [facility] Care Management Hospital Liaison [phone]

## 2017-01-21 ENCOUNTER — Other Ambulatory Visit (HOSPITAL_COMMUNITY): Payer: Medicare HMO

## 2017-01-21 ENCOUNTER — Inpatient Hospital Stay (HOSPITAL_COMMUNITY): Payer: Medicare HMO

## 2017-01-21 LAB — CBC
HEMATOCRIT: 24 % — AB (ref 36.0–46.0)
HEMOGLOBIN: 8 g/dL — AB (ref 12.0–15.0)
MCH: 28.6 pg (ref 26.0–34.0)
MCHC: 33.3 g/dL (ref 30.0–36.0)
MCV: 85.7 fL (ref 78.0–100.0)
Platelets: 331 10*3/uL (ref 150–400)
RBC: 2.8 MIL/uL — ABNORMAL LOW (ref 3.87–5.11)
RDW: 17 % — AB (ref 11.5–15.5)
WBC: 20 10*3/uL — ABNORMAL HIGH (ref 4.0–10.5)

## 2017-01-21 LAB — BASIC METABOLIC PANEL
Anion gap: 13 (ref 5–15)
BUN: 40 mg/dL — AB (ref 6–20)
CHLORIDE: 104 mmol/L (ref 101–111)
CO2: 24 mmol/L (ref 22–32)
CREATININE: 1.99 mg/dL — AB (ref 0.44–1.00)
Calcium: 8.7 mg/dL — ABNORMAL LOW (ref 8.9–10.3)
GFR calc Af Amer: 27 mL/min — ABNORMAL LOW (ref >60)
GFR calc non Af Amer: 24 mL/min — ABNORMAL LOW (ref >60)
GLUCOSE: 132 mg/dL — AB (ref 65–99)
POTASSIUM: 4.6 mmol/L (ref 3.5–5.1)
SODIUM: 141 mmol/L (ref 135–145)

## 2017-01-21 LAB — MRSA CULTURE: Culture: NOT DETECTED

## 2017-01-21 LAB — GLUCOSE, CAPILLARY
GLUCOSE-CAPILLARY: 141 mg/dL — AB (ref 65–99)
GLUCOSE-CAPILLARY: 123 mg/dL — AB (ref 65–99)
Glucose-Capillary: 131 mg/dL — ABNORMAL HIGH (ref 65–99)
Glucose-Capillary: 108 mg/dL — ABNORMAL HIGH (ref 65–99)
Glucose-Capillary: 124 mg/dL — ABNORMAL HIGH (ref 65–99)

## 2017-01-21 LAB — BRAIN NATRIURETIC PEPTIDE: B NATRIURETIC PEPTIDE 5: 812 pg/mL — AB (ref 0.0–100.0)

## 2017-01-21 LAB — PROCALCITONIN: Procalcitonin: 0.91 ng/mL

## 2017-01-21 MED ORDER — FUROSEMIDE 10 MG/ML IJ SOLN
80.0000 mg | Freq: Every day | INTRAMUSCULAR | Status: DC
  Administered 2017-01-21 – 2017-01-24 (×4): 80 mg via INTRAVENOUS
  Filled 2017-01-21 (×4): qty 8

## 2017-01-21 MED ORDER — HYDRALAZINE HCL 50 MG PO TABS
50.0000 mg | ORAL_TABLET | Freq: Three times a day (TID) | ORAL | Status: DC
  Administered 2017-01-21 – 2017-01-25 (×11): 50 mg via ORAL
  Filled 2017-01-21 (×11): qty 1

## 2017-01-21 MED ORDER — ASPIRIN 81 MG PO CHEW
81.0000 mg | CHEWABLE_TABLET | Freq: Every day | ORAL | Status: DC
  Administered 2017-01-22 – 2017-01-25 (×4): 81 mg via ORAL
  Filled 2017-01-21 (×4): qty 1

## 2017-01-21 MED ORDER — CARVEDILOL 25 MG PO TABS
25.0000 mg | ORAL_TABLET | Freq: Two times a day (BID) | ORAL | Status: DC
  Administered 2017-01-22 – 2017-01-25 (×7): 25 mg via ORAL
  Filled 2017-01-21 (×4): qty 1
  Filled 2017-01-21 (×2): qty 2
  Filled 2017-01-21: qty 1

## 2017-01-21 MED ORDER — AMIODARONE HCL 200 MG PO TABS
200.0000 mg | ORAL_TABLET | Freq: Every day | ORAL | Status: DC
  Administered 2017-01-22 – 2017-01-25 (×4): 200 mg via ORAL
  Filled 2017-01-21 (×4): qty 1

## 2017-01-21 MED ORDER — DILTIAZEM HCL 60 MG PO TABS
60.0000 mg | ORAL_TABLET | Freq: Four times a day (QID) | ORAL | Status: DC
  Administered 2017-01-22 – 2017-01-25 (×14): 60 mg via ORAL
  Filled 2017-01-21 (×14): qty 1

## 2017-01-21 MED ORDER — ATORVASTATIN CALCIUM 40 MG PO TABS
40.0000 mg | ORAL_TABLET | Freq: Every day | ORAL | Status: DC
  Administered 2017-01-22 – 2017-01-24 (×3): 40 mg via ORAL
  Filled 2017-01-21 (×3): qty 1

## 2017-01-21 NOTE — Evaluation (Signed)
Physical Therapy Evaluation Patient Details Name: Jamie Mason MRN: [id number] DOB: [birth year] Today's Date: 01/21/2017   History of Present Illness  Pt admitted through ED with L calf wound containing maggots and SOB which progressed to resp distress requiring short-term intubation.  Pt with hx of MI, DM, CAD,and a-fib.  Clinical Impression  Pt admitted as above and presenting with functional mobility limitations 2* generalized weakness, poor endurance and ambulatory balance deficits.  Pt hopes to progress to dc home with family assist.    Follow Up Recommendations Home health PT    Equipment Recommendations  None recommended by PT    Recommendations for Other Services OT consult     Precautions / Restrictions Precautions Precautions: Fall Restrictions Weight Bearing Restrictions: No      Mobility  Bed Mobility Overal bed mobility: Modified Independent             General bed mobility comments: Pt unassisted to EOB with HOB elevated 40 degrees and inceased time  Transfers Overall transfer level: Needs assistance Equipment used: Rolling walker (2 wheeled) Transfers: Sit to/from Stand Sit to Stand: Min assist;+2 physical assistance;+2 safety/equipment;From elevated surface         General transfer comment: cues for transition position and use of UEs to self assist  Ambulation/Gait Ambulation/Gait assistance: Min assist;+2 physical assistance;+2 safety/equipment Ambulation Distance (Feet): 18 Feet Assistive device: Rolling walker (2 wheeled) Gait Pattern/deviations: Step-through pattern;Decreased step length - right;Decreased step length - left;Shuffle;Trunk flexed;Wide base of support Gait velocity: decr Gait velocity interpretation: Below normal speed for age/gender General Gait Details: cues for posture and position from RW.  Pt ltd by fatigue  Stairs            Wheelchair Mobility    Modified Rankin (Stroke Patients Only)       Balance  Overall balance assessment: Needs assistance Sitting-balance support: Bilateral upper extremity supported;Feet supported Sitting balance-Leahy Scale: Good     Standing balance support: Bilateral upper extremity supported Standing balance-Leahy Scale: Poor                               Pertinent Vitals/Pain Pain Assessment: No/denies pain    Home Living Family/patient expects to be discharged to:: Private residence Living Arrangements: Spouse/significant other Available Help at Discharge: Family;Available 24 hours/day Type of Home: House Home Access: Stairs to enter Entrance Stairs-Rails: Right Entrance Stairs-Number of Steps: 3 Home Layout: One level Home Equipment: Walker - 2 wheels;Cane - quad Additional Comments: husband is blind, 1 dtr works, other dtr stays at home during the day    Prior Function Level of Independence: Independent with assistive device(s)         Comments: walks with cane, sleeps in recliner, sponge bathes     Hand Dominance   Dominant Hand: Right    Extremity/Trunk Assessment   Upper Extremity Assessment Upper Extremity Assessment: Generalized weakness    Lower Extremity Assessment Lower Extremity Assessment: Generalized weakness    Cervical / Trunk Assessment Cervical / Trunk Assessment: Kyphotic  Communication   Communication: No difficulties  Cognition Arousal/Alertness: Awake/alert Behavior During Therapy: WFL for tasks assessed/performed Overall Cognitive Status: Within Functional Limits for tasks assessed                                        General Comments  Exercises     Assessment/Plan    PT Assessment Patient needs continued PT services  PT Problem List Decreased strength;Decreased range of motion;Decreased activity tolerance;Decreased mobility;Pain;Decreased balance;Decreased knowledge of use of DME;Obesity       PT Treatment Interventions DME instruction;Gait training;Stair  training;Functional mobility training;Therapeutic activities;Therapeutic exercise;Patient/family education    PT Goals (Current goals can be found in the Care Plan section)  Acute Rehab PT Goals Patient Stated Goal: Regain strength and return home PT Goal Formulation: With patient Time For Goal Achievement: 02/04/17 Potential to Achieve Goals: Fair    Frequency Min 3X/week   Barriers to discharge        Co-evaluation               AM-PAC PT "6 Clicks" Daily Activity  Outcome Measure Difficulty turning over in bed (including adjusting bedclothes, sheets and blankets)?: A Lot Difficulty moving from lying on back to sitting on the side of the bed? : A Lot Difficulty sitting down on and standing up from a chair with arms (e.g., wheelchair, bedside commode, etc,.)?: Total Help needed moving to and from a bed to chair (including a wheelchair)?: A Little Help needed walking in hospital room?: A Little Help needed climbing 3-5 steps with a railing? : A Lot 6 Click Score: 13    End of Session Equipment Utilized During Treatment: Gait belt Activity Tolerance: Patient limited by fatigue Patient left: in chair;Other (comment) (wc for transport to 4rth floor) Nurse Communication: Mobility status PT Visit Diagnosis: Unsteadiness on feet (R26.81);Difficulty in walking, not elsewhere classified (R26.2);Muscle weakness (generalized) (M62.81)    Time: [id number] PT Time Calculation (min) (ACUTE ONLY): 21 min   Charges:   PT Evaluation $PT Eval Low Complexity: 1 Low     PT G Codes:        Pg [phone]   Jamie Mason 01/21/2017, 4:07 PM

## 2017-01-21 NOTE — Progress Notes (Signed)
Patient received from ICU to Room 1429. A&Ox4. Oriented to room and call bell. Resting comfortably in chair. Agree with shift assessment. Call bell in reach and chair alarm on.   Delford FieldGagliano, Danae Oland E, RN

## 2017-01-21 NOTE — Progress Notes (Signed)
Pt. set up/placed on CPAP for H/S trial, explained to pt., humidity filled, 02 placed @ 2 lpm into circuit, 97 sats, tolerating well, made aware to notify if needed, RN made aware, RT to monitor.

## 2017-01-21 NOTE — Progress Notes (Addendum)
Brief: Jamie Mason is a 76 y.o. 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  She was evaluated by the hospitalist & surgical teams and was admitted; however, before leaving the emergency department, she had worsening respiratory distress. She was started on BiPAP but ultimately failed and required intubation. PCCM was subsequently called to admit patient to the ICU.  Subjective  Afebrile Awake & interactive Good UO with lasix   Vitals:   01/21/17 0700 01/21/17 0800  BP: (!) 125/30 (!) 157/58  Pulse: 91 92  Resp: 10 14  Temp:    SpO2: 100% 100%    Intake/Output Summary (Last 24 hours) at 01/21/17 0843 Last data filed at 01/21/17 0800  Gross per 24 hour  Intake             1930 ml  Output             1325 ml  Net              605 ml    General appearance:  76 Year old  female, chronically ill appearing, awake Eyes: anicteric sclerae, moist conjunctivae; PERRL, EOMI bilaterally. Mouth:  membranes and no mucosal ulcerations;  Neck: Trachea midline; neck supple, no JVD Lungs/chest:decreased BL , with normal respiratory effort and no intercostal retractions CV:AF on tele w/ CVR. No murmur.   Abdomen: Soft, non-tender; no masses or HSM Extremities: chronic LE edema. The left leg/ankle area is wrapped in kerlex  Skin: Normal temperature, turgor and texture; no rash, ulcers or subcutaneous nodules Psych: Appropriate affect, alert and oriented to person, place and time   CBC Recent Labs     01/19/17  1535  01/20/17  0330  01/21/17  0328  WBC  9.6  15.0*  20.0*  HGB  8.2*  9.2*  8.0*  HCT  24.7*  28.7*  24.0*  PLT  332  332  331    Coag's No results for input(s): APTT, INR in the last 72 hours.  BMET Recent Labs     01/19/17  1535  01/20/17  0717  01/21/17  0328  NA  143  144  141  K  3.7  4.0  4.6  CL  107  106  104  CO2  28  26  24   BUN  31*  30*  40*  CREATININE  1.76*  1.71*  1.99*  GLUCOSE  122*  170*  132*     Electrolytes Recent Labs     01/19/17  1535  01/19/17  2246  01/20/17  0717  01/21/17  0328  CALCIUM  9.0   --   8.9  8.7*  MG   --   2.0  2.0   --   PHOS   --    --   4.7*   --     Sepsis Markers Recent Labs     01/19/17  2246  01/20/17  0717  01/21/17  0328  PROCALCITON  0.18  0.63  0.91    ABG Recent Labs     01/19/17  2200  01/20/17  0448  PHART  7.303*  7.292*  PCO2ART  53.1*  54.8*  PO2ART  282*  74.7*    Liver Enzymes No results for input(s): AST, ALT, ALKPHOS, BILITOT, ALBUMIN in the last 72 hours.  Cardiac Enzymes Recent Labs     01/19/17  2246  01/20/17  0330  01/20/17  1610  TROPONINI  <0.03  <0.03  <0.03    Glucose Recent Labs     01/20/17  1243  01/20/17  1546  01/20/17  2026  01/20/17  2328  01/21/17  0404  01/21/17  0709  GLUCAP  171*  124*  153*  123*  131*  108*    Imaging Dg Tibia/fibula Left  Result Date: 01/19/2017 CLINICAL DATA:  Nonhealing wound of distal left lower leg. EXAM: LEFT TIBIA AND FIBULA - 2 VIEW COMPARISON:  06/26/2016 FINDINGS: There is no evidence of acute fracture, subluxation or dislocation. No radiographic evidence of osteomyelitis noted. Degenerative changes in the knee noted. Calcaneal spur is again noted. No radiopaque foreign bodies are identified. IMPRESSION: No evidence of acute bony abnormality. Electronically Signed   By: Harmon Pier M.D.   On: 01/19/2017 15:40   Dg Chest Port 1 View  Result Date: 01/21/2017 CLINICAL DATA:  Pulmonary edema. EXAM: PORTABLE CHEST 1 VIEW COMPARISON:  01/20/2017, 07/06/2016 and 03/19/2014 FINDINGS: Patient slightly rotated left. Lungs are adequately inflated demonstrate stable hazy prominence of the central pulmonary vessels. No lobar consolidation, effusion or pneumothorax. Prominent right hilum unchanged from recent exams although worse compared to 03/19/2014. Mild stable cardiomegaly. Calcified plaque over the aortic arch. Degenerative change of the spine. IMPRESSION:  Stable hazy prominence of the central pulmonary vessels suggesting mild vascular congestion. Mild stable cardiomegaly. Prominence of the right hilum. Consider PA and lateral chest radiograph when feasible for better evaluation versus contrast-enhanced chest CT for further evaluation. Aortic Atherosclerosis (ICD10-I70.0). Electronically Signed   By: Elberta Fortis M.D.   On: 01/21/2017 07:16   Dg Chest Port 1 View  Result Date: 01/20/2017 CLINICAL DATA:  Shortness of breath.  Pulmonary edema. EXAM: PORTABLE CHEST 1 VIEW COMPARISON:  Multiple prior chest x-rays, most recently earlier today. FINDINGS: Interval removal of the endotracheal and enteric tubes. Cardiomegaly, unchanged. Right greater than left perihilar opacities, similar to prior study. Bibasilar atelectasis. No definite pleural effusion. No acute osseous abnormality. IMPRESSION: New 1. Stable cardiomegaly and bilateral predominantly perihilar densities, likely edema. 2. No definite pneumothorax. Electronically Signed   By: Obie Dredge M.D.   On: 01/20/2017 12:33   Dg Chest Port 1 View  Result Date: 01/20/2017 CLINICAL DATA:  Respiratory failure EXAM: PORTABLE CHEST 1 VIEW COMPARISON:  01/19/2017 FINDINGS: Endotracheal and NG tubes are stable. Diffuse extensive bilateral airspace disease is stable. There is lucency projecting over the right hilum. Developing pneumothorax in a supine patient is not excluded. Linear atelectasis in left mid lung is stable. Cardiomegaly. IMPRESSION: Stable cardiomegaly and bilateral airspace disease. Stable support apparatus Possible focal pneumothorax on the right. Attention on follow-up studies is recommended. Electronically Signed   By: Jolaine Click M.D.   On: 01/20/2017 07:46   Dg Chest Portable 1 View  Result Date: 01/19/2017 CLINICAL DATA:  Retraction of endotracheal tube. EXAM: PORTABLE CHEST 1 VIEW COMPARISON:  Film earlier today at 2046 hours FINDINGS: Endotracheal tube tip now projects approximately 4.5  cm above the carina. Lungs demonstrate some improvement in pulmonary edema since the prior study with moderate edema remaining. Orogastric tube extends below the diaphragm. No significant pleural fluid identified. IMPRESSION: Endotracheal tube tip approximately 4.5 cm above the carina. Decrease in pulmonary edema since the prior study. Electronically Signed   By: Irish Lack M.D.   On: 01/19/2017 22:16   Dg Chest Portable 1 View  Result Date: 01/19/2017 CLINICAL DATA:  Respiratory failure and status post intubation. EXAM: PORTABLE CHEST 1 VIEW COMPARISON:  10/27/2016 FINDINGS: Endotracheal tube present with the tip approximately 3.5 cm above the carina. Nasogastric tube extends below the diaphragm. The heart is mildly enlarged. Lungs show diffuse pulmonary edema. No pneumothorax or large pleural effusions. IMPRESSION: Endotracheal tube tip is approximately 3.5 cm above the carina. Lungs demonstrate diffuse pulmonary edema. Electronically Signed   By: Irish LackGlenn  Yamagata M.D.   On: 01/19/2017 21:18   Dg Abd Portable 1 View  Result Date: 01/19/2017 CLINICAL DATA:  Orogastric tube placement. EXAM: PORTABLE ABDOMEN - 1 VIEW COMPARISON:  None. FINDINGS: Orogastric tube extends into the stomach with the tip likely located in the body of the stomach. Other visualized bowel gas is unremarkable. IMPRESSION: Orogastric tube extends into the stomach with the tip located in the expected region of the body of the stomach. Electronically Signed   By: Irish LackGlenn  Yamagata M.D.   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  HTN  H/o afib (on DOAC) P: Cont tele  F/u ECHO r/o new/progressive CM Cont amiodarone Cont daily ASA, cont apixaban Resume carvedilol, apresoline and CCB  Cont Lipitor    RENAL A: CKD -->baseline cr 1.53 to 1.99 P: Since BUN/cr rising, decrease lasix to 80 q 24 Renal dose meds  f/u am chemistry   GI A: Morbid obesity P: Cont SUP w/ PPI  HEME A: Chronic anemia  Hematuria P: Trend CBC Transfuse per protocol  Dc  apixaban since cr rising & hematuria - can restart once resolved  ENDOCRINE A: DM P: ssi Holding orals   Infectious disease.  A: Left lower extremity leg wound w/ maggot infestation  -seen by wound, Ortho and general surgery. No surgery indicated. No Osteo.  P: Wound care as outlined.  Ct  rocephin, flagyl  And dc  vanc   Discussion Flash edema. Await echo - known gr 2 DD with mod PH , Likely has underlying OSA  Can transfer tot ele & to [facility] 8/12  Cyril Mourningakesh Brindley Madarang MD. [facility] IncFCCP. [facility] Pager [phone] If no response call [phone]   01/21/2017

## 2017-01-21 NOTE — Progress Notes (Signed)
Patient eating at 2:30 pm when echo was attempted

## 2017-01-21 NOTE — Progress Notes (Signed)
Report given to RN Dois DavenportSandra, pt transferred to [phone]

## 2017-01-22 ENCOUNTER — Encounter (HOSPITAL_COMMUNITY): Payer: Self-pay | Admitting: *Deleted

## 2017-01-22 ENCOUNTER — Inpatient Hospital Stay (HOSPITAL_COMMUNITY): Payer: Medicare HMO

## 2017-01-22 DIAGNOSIS — I4892 Unspecified atrial flutter: Secondary | ICD-10-CM

## 2017-01-22 DIAGNOSIS — L97529 Non-pressure chronic ulcer of other part of left foot with unspecified severity: Secondary | ICD-10-CM

## 2017-01-22 DIAGNOSIS — R0603 Acute respiratory distress: Secondary | ICD-10-CM

## 2017-01-22 DIAGNOSIS — I25118 Atherosclerotic heart disease of native coronary artery with other forms of angina pectoris: Secondary | ICD-10-CM

## 2017-01-22 DIAGNOSIS — I5033 Acute on chronic diastolic (congestive) heart failure: Secondary | ICD-10-CM

## 2017-01-22 DIAGNOSIS — I340 Nonrheumatic mitral (valve) insufficiency: Secondary | ICD-10-CM

## 2017-01-22 LAB — GLUCOSE, CAPILLARY
GLUCOSE-CAPILLARY: 121 mg/dL — AB (ref 65–99)
GLUCOSE-CAPILLARY: 96 mg/dL (ref 65–99)
GLUCOSE-CAPILLARY: 124 mg/dL — AB (ref 65–99)
Glucose-Capillary: 104 mg/dL — ABNORMAL HIGH (ref 65–99)
Glucose-Capillary: 121 mg/dL — ABNORMAL HIGH (ref 65–99)
Glucose-Capillary: 145 mg/dL — ABNORMAL HIGH (ref 65–99)
Glucose-Capillary: 150 mg/dL — ABNORMAL HIGH (ref 65–99)

## 2017-01-22 LAB — BASIC METABOLIC PANEL
ANION GAP: 12 (ref 5–15)
BUN: 51 mg/dL — AB (ref 6–20)
CHLORIDE: 106 mmol/L (ref 101–111)
CO2: 23 mmol/L (ref 22–32)
Calcium: 8.4 mg/dL — ABNORMAL LOW (ref 8.9–10.3)
Creatinine, Ser: 2.07 mg/dL — ABNORMAL HIGH (ref 0.44–1.00)
GFR, EST AFRICAN AMERICAN: 26 mL/min — AB (ref >60)
GFR, EST NON AFRICAN AMERICAN: 22 mL/min — AB (ref >60)
Glucose, Bld: 129 mg/dL — ABNORMAL HIGH (ref 65–99)
POTASSIUM: 4.1 mmol/L (ref 3.5–5.1)
SODIUM: 141 mmol/L (ref 135–145)

## 2017-01-22 LAB — CULTURE, RESPIRATORY: CULTURE: NORMAL

## 2017-01-22 LAB — ECHOCARDIOGRAM COMPLETE
HEIGHTINCHES: 68 in
Weight: 5248.71 oz

## 2017-01-22 LAB — CULTURE, RESPIRATORY W GRAM STAIN

## 2017-01-22 MED ORDER — APIXABAN 5 MG PO TABS
5.0000 mg | ORAL_TABLET | Freq: Two times a day (BID) | ORAL | Status: DC
  Administered 2017-01-22 – 2017-01-25 (×7): 5 mg via ORAL
  Filled 2017-01-22 (×7): qty 1

## 2017-01-22 MED ORDER — SIMETHICONE 40 MG/0.6ML PO SUSP
40.0000 mg | Freq: Four times a day (QID) | ORAL | Status: DC | PRN
Start: 2017-01-22 — End: 2017-01-25
  Administered 2017-01-22: 40 mg via ORAL
  Filled 2017-01-22: qty 0.6

## 2017-01-22 MED ORDER — BISACODYL 10 MG RE SUPP: 10.0000 mg | Freq: Every day | RECTAL | Status: DC | PRN

## 2017-01-22 NOTE — Progress Notes (Signed)
  Echocardiogram 2D Echocardiogram has been performed.  Jamie Mason 01/22/2017, 8:58 AM

## 2017-01-22 NOTE — Progress Notes (Signed)
ANTICOAGULATION CONSULT NOTE - Initial Consult  Pharmacy Consult for apixaban Indication: atrial fibrillation  Allergies  Allergen Reactions  . Lisinopril Swelling    Tongue swelling   . Ace Inhibitors Other (See Comments)    unknown  . Codeine Itching and Rash  . Penicillins Itching and Rash    Has patient had a PCN reaction causing immediate rash, facial/tongue/throat swelling, SOB or lightheadedness with hypotension: unknown Has patient had a PCN reaction causing severe rash involving mucus membranes or skin necrosis: unknown Has patient had a PCN reaction that required hospitalization: no Has patient had a PCN reaction occurring within the last 10 years: no If all of the above answers are "NO", then may proceed with Cephalosporin use.  . Tomato     Itchy rash    Patient Measurements: Height: '5\' 8"'  (172.7 cm) Weight: (!) 328 lb 0.7 oz (148.8 kg) IBW/kg (Calculated) : 63.9 Heparin Dosing Weight:    Vital Signs: Temp: (P) 98.8 F (37.1 C) (08/12 1210) Temp Source: (P) Oral (08/12 1210) BP: 173/69 (08/12 1210) Pulse Rate: 94 (08/12 1230)  Labs:  Recent Labs  01/19/17 1535 01/19/17 2246 01/20/17 0330 01/20/17 0717 01/20/17 0854 01/21/17 0328 01/22/17 0452  HGB 8.2*  --  9.2*  --   --  8.0*  --   HCT 24.7*  --  28.7*  --   --  24.0*  --   PLT 332  --  332  --   --  331  --   CREATININE 1.76*  --   --  1.71*  --  1.99* 2.07*  TROPONINI  --  <0.03 <0.03  --  <0.03  --   --     Estimated Creatinine Clearance: 36.9 mL/min (A) (by C-G formula based on SCr of 2.07 mg/dL (H)).   Medical History: Past Medical History:  Diagnosis Date  . Atrial flutter with rapid ventricular response ([place]) 07/11/2016  . CAD (coronary artery disease) 05/04/2011  . Diabetes mellitus without complication ([place])   . Diastolic heart failure ([place]) 06/2016   EF 60-65%, G2DD  . Hyperlipidemia   . Hypertension   . Lumbar disc disease   . Morbid Obesity 05/02/2011  . Non-ST elevation  myocardial infarction (NSTEMI), initial episode of care ([place]) 05/02/2011    Medications:  Scheduled:  . amiodarone  200 mg Oral Daily  . apixaban  5 mg Oral BID  . arformoterol  15 mcg Nebulization BID  . aspirin  81 mg Oral Daily  . atorvastatin  40 mg Oral q1800  . budesonide (PULMICORT) nebulizer solution  0.5 mg Nebulization BID  . carvedilol  25 mg Oral BID WC  . chlorhexidine gluconate (MEDLINE KIT)  15 mL Mouth Rinse BID  . diltiazem  60 mg Oral Q6H  . furosemide  80 mg Intravenous Daily  . hydrALAZINE  50 mg Oral Q8H  . insulin aspart  0-15 Units Subcutaneous Q4H  . ipratropium-albuterol  3 mL Nebulization TID  . mouth rinse  15 mL Mouth Rinse QID    Assessment:  Pharmacy is consulted to dose apixaban in 76 yo pt  For a. fib. Pt was on med PTA. Per notes pt was taking only once a day.  Med was held on admission due to rising SCr and hematuria. However, med is being resumed today.   Today, 01/22/17  Hgb 8.0, plt 331 ( 8/11)   Scr elevated at 2.07  Goal of Therapy:  Monitor platelets by anticoagulation protocol: Yes   Plan:  Apixaban 5 mg PO BID  Monitor renal function  Monitor for signs and symptoms of bleeding   Royetta Asal, PharmD, BCPS Pager [phone] 01/22/2017 12:44 PM

## 2017-01-22 NOTE — Progress Notes (Addendum)
Report given to RN Diannia Rud[place]Kara [place] and [place] transferred to 1231 via bed.

## 2017-01-22 NOTE — Progress Notes (Signed)
PROGRESS NOTE                                                                                                                                                                                                             Patient Demographics:    Jamie Mason, is a 76 y.o. female, DOB - 01-Aug-1940, KPV:[id number]  Admit date - 01/19/2017   Admitting Physician Rigoberto Noel, MD  Outpatient Primary MD for the patient is Bartholomew Crews, MD  LOS - 3   No chief complaint on file.      Brief Narrative   76 y.o. female  with past medical history significant for hypertension, diabetes type 2, hyperlipidemia, coronary artery disease, atrial fibrillation and flutter and chronic lower extremity edema who Was sent by her visiting home nurse for infected lotion that he wants with maggots, she developed respiratory distress in ED secondary to pulmonary edema, failed BiPAP, required intubation and admitted to ICU , she was extubated and transferred [facility], she is on some respiratory distress in a.m., transferred to step down for BiPAP.   Subjective:    Jamie Mason today Reports some dyspnea this a.m., she denies any chest pain cough, no palpitation , no fever or chills .   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 - Patient and son respiratory distress this a.m., tachypnea, to be transferred back to step down for BiPAP. - Continue with current dose IV diuresis. -Cont CPAP during  sleep and sleep  study as an outpatient as she likely has OSA per PCCM.  Acute on chronic diastolic CHF - Continue with IV diuresis, she remains with positive balance, but I will continue with current dose Lasix 80 mg IV daily given her increased creatinine to 2, from baseline of 1.5, continue to monitor daily weights, strict ins and outs. - Follow on 2-D echo, it has been  done, but reading still pending  AKI on CK D stage III - Baseline creatinine 1.4-1.5, increasing gradually as an IV diuresis, creatinine of 2 today, will continue with current dose Lasix, monitor closely.  Diabetic foot ulcer - Input appreciated, no need for debridement, continue with local wound care - Continue with Rocephin and Flagyl,No evidence of osteomyelitis on plain films. - Still with significant leukocytosis, recheck CBC in a.m..  DM - Hold metformin. Continue with insulin sliding scale   HTN - Blood pressure controlled, Continue hydralazine, diltiazem, Lasix, carvedilol  CAD - Continue carvedilol and atorvastatin and aspirin.  Atrial fibrillation - Continue amiodarone, diltiazem , carvedilol for heart rate control  - Resume on Eliquis  Hyperlipidemia - Continue Atorvastatin   Code Status : Full  Family Communication  : Discussed with patient this a.m., none at bedside  Disposition Plan  : Back to step down as she will need BiPAP  Consults  :  Surgery, PC CM  Procedures  : Intubation/extubation by PCCM  DVT Prophylaxis  :  On a liquid  Lab Results  Component Value Date   PLT 331 01/21/2017    Antibiotics  :    Anti-infectives    Start     Dose/Rate Route Frequency Ordered Stop   01/20/17 1400  vancomycin (VANCOCIN) 1,750 mg in sodium chloride 0.9 % 500 mL IVPB  Status:  Discontinued     1,750 mg 250 mL/hr over 120 Minutes Intravenous Every 24 hours 01/19/17 1843 01/21/17 0851   01/19/17 2200  cefTRIAXone (ROCEPHIN) 2 g in dextrose 5 % 50 mL IVPB     2 g 100 mL/hr over 30 Minutes Intravenous  Every 24 hours 01/19/17 2142     01/19/17 2000  metroNIDAZOLE (FLAGYL) IVPB 500 mg     500 mg 100 mL/hr over 60 Minutes Intravenous Every 8 hours 01/19/17 1808     01/19/17 2000  ceFEPIme (MAXIPIME) 2 g in dextrose 5 % 50 mL IVPB  Status:  Discontinued     2 g 100 mL/hr over 30 Minutes Intravenous Every 12 hours 01/19/17 1843 01/19/17 2124   01/19/17 1815  vancomycin (VANCOCIN) IVPB 1000 mg/200 mL premix  Status:  Discontinued     1,000 mg 200 mL/hr over 60 Minutes Intravenous Every 8 hours 01/19/17 1808 01/19/17 1843   01/19/17 1800  ciprofloxacin (CIPRO) IVPB 400 mg  Status:  Discontinued     400 mg 200 mL/hr over 60 Minutes Intravenous Every 12 hours 01/19/17 1808 01/19/17 1833   01/19/17 1445  ciprofloxacin (CIPRO) IVPB 400 mg     400 mg 200 mL/hr over 60 Minutes Intravenous  Once 01/19/17 1434 01/19/17 1733   01/19/17 1445  vancomycin (VANCOCIN) 2,000 mg in sodium chloride 0.9 % 500 mL IVPB     2,000 mg 250 mL/hr over 120 Minutes Intravenous  Once 01/19/17 1441 01/19/17 1931        Objective:   Vitals:   01/21/17 2101 01/21/17 2235 01/22/17 0654 01/22/17 1014  BP: (!) 127/47  128/65   Pulse: 90 92 (!) 103   Resp: '20 18 20   ' Temp: 98.2 F (36.8 C)  98.2 F (36.8 C)   TempSrc: Oral  Oral   SpO2: 95%  100% 99%  Weight:   (!) 148.8 kg (328 lb 0.7 oz)   Height:        Wt Readings from Last 3 Encounters:  01/22/17 (!) 148.8 kg (328 lb 0.7 oz)  01/19/17 (!) 148.8 kg (328 lb)  12/23/16 (!) 147 kg (324 lb 1.6 oz)     Intake/Output Summary (Last 24 hours) at 01/22/17 1211 Last data filed at 01/22/17 0930  Gross per 24 hour  Intake              250 ml  Output              800 ml  Net             -550 ml     Physical Exam  Awake Alert, Oriented X 3,In mild respiratory distress this a.m. Symmetrical Chest wall movement, acute neck with some use of accessory muscle, no wheezing,  bibasilar crackles . RRR,No Gallops,Rubs or new Murmurs, No Parasternal Heave +ve  B.Sounds, Abd Soft, No tenderness, No rebound - guarding or rigidity. No Cyanosis, Clubbing or edema, wound in  left lower extremity bandaged   Data Review:    CBC  Recent Labs Lab 01/19/17 1535 01/20/17 0330 01/21/17 0328  WBC 9.6 15.0* 20.0*  HGB 8.2* 9.2* 8.0*  HCT 24.7* 28.7* 24.0*  PLT 332 332 331  MCV 86.1 85.4 85.7  MCH 28.6 27.4 28.6  MCHC 33.2 32.1 33.3  RDW 17.1* 17.1* 17.0*  LYMPHSABS 1.4  --   --   MONOABS 0.5  --   --   EOSABS 0.2  --   --   BASOSABS 0.0  --   --     Chemistries   Recent Labs Lab 01/19/17 1535 01/19/17 2246 01/20/17 0717 01/21/17 0328 01/22/17 0452  NA 143  --  144 141 141  K 3.7  --  4.0 4.6 4.1  CL 107  --  106 104 106  CO2 28  --  '26 24 23  ' GLUCOSE 122*  --  170* 132* 129*  BUN 31*  --  30* 40* 51*  CREATININE 1.76*  --  1.71* 1.99* 2.07*  CALCIUM 9.0  --  8.9 8.7* 8.4*  MG  --  2.0 2.0  --   --    ------------------------------------------------------------------------------------------------------------------  Recent Labs  01/19/17 2246  TRIG 81    Lab Results  Component Value Date   HGBA1C 6.8 10/06/2016   ------------------------------------------------------------------------------------------------------------------ No results for input(s): TSH, T4TOTAL, T3FREE, THYROIDAB in the last 72 hours.  Invalid input(s): FREET3 ------------------------------------------------------------------------------------------------------------------ No results for input(s): VITAMINB12, FOLATE, FERRITIN, TIBC, IRON, RETICCTPCT in the last 72 hours.  Coagulation profile No results for input(s): INR, PROTIME in the last 168 hours.  No results for input(s): DDIMER in the last 72 hours.  Cardiac Enzymes  Recent Labs Lab 01/19/17 2246 01/20/17 0330 01/20/17 0854  TROPONINI <0.03 <0.03 <0.03   ------------------------------------------------------------------------------------------------------------------    Component  Value Date/Time   BNP 812.0 (H) 01/21/2017 0328    Inpatient Medications  Scheduled Meds: . amiodarone  200 mg Oral Daily  . arformoterol  15 mcg Nebulization BID  . aspirin  81 mg Oral Daily  . atorvastatin  40 mg Oral q1800  . budesonide (PULMICORT) nebulizer solution  0.5 mg Nebulization BID  . carvedilol  25 mg Oral BID WC  . chlorhexidine gluconate (MEDLINE KIT)  15 mL Mouth Rinse BID  . diltiazem  60 mg Oral Q6H  . furosemide  80 mg Intravenous Daily  . hydrALAZINE  50 mg Oral Q8H  . insulin aspart  0-15 Units Subcutaneous Q4H  . ipratropium-albuterol  3 mL Nebulization TID  . mouth rinse  15 mL Mouth Rinse QID   Continuous Infusions: . sodium chloride 250 mL (  01/21/17 1000)  . cefTRIAXone (ROCEPHIN)  IV Stopped (01/21/17 2242)  . metronidazole 500 mg (01/22/17 0500)   PRN Meds:.sodium chloride, albuterol, hydrALAZINE, hydrOXYzine  Micro Results Recent Results (from the past 240 hour(s))  Culture, blood (Routine X 2) w Reflex to ID Panel     Status: None (Preliminary result)   Collection Time: 01/19/17 10:45 PM  Result Value Ref Range Status   Specimen Description BLOOD BLOOD RIGHT HAND  Final   Special Requests   Final    BOTTLES DRAWN AEROBIC AND ANAEROBIC Blood Culture adequate volume   Culture   Final    NO GROWTH 2 DAYS Performed at [facility], [place] [address] [place], [place] [address]    Report Status PENDING  Incomplete  Culture, blood (Routine X 2) w Reflex to ID Panel     Status: None (Preliminary result)   Collection Time: 01/19/17 10:47 PM  Result Value Ref Range Status   Specimen Description BLOOD RIGHT ANTECUBITAL  Final   Special Requests IN PEDIATRIC BOTTLE Blood Culture adequate volume  Final   Culture   Final    NO GROWTH 2 DAYS Performed at [facility], [place] [address], [place], [place] [address]    Report Status PENDING  Incomplete  Aerobic Culture (superficial specimen)     Status: None (Preliminary result)   Collection  Time: 01/19/17 11:12 PM  Result Value Ref Range Status   Specimen Description WOUND LEG LEFT  Final   Special Requests NONE  Final   Gram Stain   Final    ABUNDANT WBC PRESENT,BOTH PMN AND MONONUCLEAR ABUNDANT GRAM POSITIVE COCCI IN PAIRS IN CLUSTERS MODERATE GRAM VARIABLE ROD    Culture   Final    CULTURE REINCUBATED FOR BETTER GROWTH Performed at [facility], [place] [address], [place], [place] [address]    Report Status PENDING  Incomplete  MRSA PCR Screening     Status: Abnormal   Collection Time: 01/20/17 12:44 AM  Result Value Ref Range Status   MRSA by PCR INVALID RESULTS, SPECIMEN SENT FOR CULTURE (A) NEGATIVE Final    Comment:        The GeneXpert MRSA Assay (FDA approved for NASAL specimens only), is one component of a comprehensive MRSA colonization surveillance program. It is not intended to diagnose MRSA infection nor to guide or monitor treatment for MRSA infections.   MRSA culture     Status: None   Collection Time: 01/20/17 12:44 AM  Result Value Ref Range Status   Specimen Description NOSE  Final   Special Requests NONE  Final   Culture   Final    NO MRSA DETECTED Performed at [facility], [address] [place], [place] [address]    Report Status 01/21/2017 FINAL  Final  Culture, respiratory (NON-Expectorated)     Status: None   Collection Time: 01/20/17  5:25 AM  Result Value Ref Range Status   Specimen Description TRACHEAL ASPIRATE  Final   Special Requests NONE  Final   Gram Stain   Final    RARE WBC PRESENT,BOTH PMN AND MONONUCLEAR NO ORGANISMS SEEN    Culture   Final    Consistent with normal respiratory flora. Performed at [facility], [place] [address] [place], [place] [address]    Report Status 01/22/2017 FINAL  Final    Radiology Reports Dg Tibia/fibula Left  Result Date: 01/19/2017 CLINICAL DATA:  Nonhealing wound of distal left lower leg. EXAM:  LEFT TIBIA AND FIBULA - 2 VIEW COMPARISON:  06/26/2016 FINDINGS: There  is no evidence of acute fracture, subluxation or dislocation. No radiographic evidence of osteomyelitis noted. Degenerative changes in the knee noted. Calcaneal spur is again noted. No radiopaque foreign bodies are identified. IMPRESSION: No evidence of acute bony abnormality. Electronically Signed   By: Margarette Canada M.D.   On: 01/19/2017 15:40   Dg Chest Port 1 View  Result Date: 01/22/2017 CLINICAL DATA:  Shortness of breath today. EXAM: PORTABLE CHEST 1 VIEW COMPARISON:  01/21/2017. FINDINGS: Stable enlarged cardiac silhouette. Mild increase in prominence of the interstitial markings. No pleural fluid. The right hilum appears less prominent today. Unremarkable bones. IMPRESSION: Stable cardiomegaly with interval probable mild interstitial pulmonary edema. Electronically Signed   By: Claudie Revering M.D.   On: 01/22/2017 10:53   Dg Chest Port 1 View  Result Date: 01/21/2017 CLINICAL DATA:  Pulmonary edema. EXAM: PORTABLE CHEST 1 VIEW COMPARISON:  01/20/2017, 07/06/2016 and 03/19/2014 FINDINGS: Patient slightly rotated left. Lungs are adequately inflated demonstrate stable hazy prominence of the central pulmonary vessels. No lobar consolidation, effusion or pneumothorax. Prominent right hilum unchanged from recent exams although worse compared to 03/19/2014. Mild stable cardiomegaly. Calcified plaque over the aortic arch. Degenerative change of the spine. IMPRESSION: Stable hazy prominence of the central pulmonary vessels suggesting mild vascular congestion. Mild stable cardiomegaly. Prominence of the right hilum. Consider PA and lateral chest radiograph when feasible for better evaluation versus contrast-enhanced chest CT for further evaluation. Aortic Atherosclerosis (ICD10-I70.0). Electronically Signed   By: Marin Olp M.D.   On: 01/21/2017 07:16   Dg Chest Port 1 View  Result Date: 01/20/2017 CLINICAL DATA:  Shortness of breath.  Pulmonary edema. EXAM: PORTABLE CHEST 1 VIEW COMPARISON:  Multiple  prior chest x-rays, most recently earlier today. FINDINGS: Interval removal of the endotracheal and enteric tubes. Cardiomegaly, unchanged. Right greater than left perihilar opacities, similar to prior study. Bibasilar atelectasis. No definite pleural effusion. No acute osseous abnormality. IMPRESSION: New 1. Stable cardiomegaly and bilateral predominantly perihilar densities, likely edema. 2. No definite pneumothorax. Electronically Signed   By: Titus Dubin M.D.   On: 01/20/2017 12:33   Dg Chest Port 1 View  Result Date: 01/20/2017 CLINICAL DATA:  Respiratory failure EXAM: PORTABLE CHEST 1 VIEW COMPARISON:  01/19/2017 FINDINGS: Endotracheal and NG tubes are stable. Diffuse extensive bilateral airspace disease is stable. There is lucency projecting over the right hilum. Developing pneumothorax in a supine patient is not excluded. Linear atelectasis in left mid lung is stable. Cardiomegaly. IMPRESSION: Stable cardiomegaly and bilateral airspace disease. Stable support apparatus Possible focal pneumothorax on the right. Attention on follow-up studies is recommended. Electronically Signed   By: Marybelle Killings M.D.   On: 01/20/2017 07:46   Dg Chest Portable 1 View  Result Date: 01/19/2017 CLINICAL DATA:  Retraction of endotracheal tube. EXAM: PORTABLE CHEST 1 VIEW COMPARISON:  Film earlier today at 2046 hours FINDINGS: Endotracheal tube tip now projects approximately 4.5 cm above the carina. Lungs demonstrate some improvement in pulmonary edema since the prior study with moderate edema remaining. Orogastric tube extends below the diaphragm. No significant pleural fluid identified. IMPRESSION: Endotracheal tube tip approximately 4.5 cm above the carina. Decrease in pulmonary edema since the prior study. Electronically Signed   By: Aletta Edouard M.D.   On: 01/19/2017 22:16   Dg Chest Portable 1 View  Result Date: 01/19/2017 CLINICAL DATA:  Respiratory failure and status post intubation. EXAM: PORTABLE CHEST  1 VIEW COMPARISON:  10/27/2016 FINDINGS: Endotracheal tube present with the tip approximately 3.5 cm above the carina. Nasogastric tube extends below the diaphragm. The heart is mildly enlarged. Lungs show diffuse pulmonary edema. No pneumothorax or large pleural effusions. IMPRESSION: Endotracheal tube tip is approximately 3.5 cm above the carina. Lungs demonstrate diffuse pulmonary edema. Electronically Signed   By: Aletta Edouard M.D.   On: 01/19/2017 21:18   Dg Abd Portable 1 View  Result Date: 01/19/2017 CLINICAL DATA:  Orogastric tube placement. EXAM: PORTABLE ABDOMEN - 1 VIEW COMPARISON:  None. FINDINGS: Orogastric tube extends into the stomach with the tip likely located in the body of the stomach. Other visualized bowel gas is unremarkable. IMPRESSION: Orogastric tube extends into the stomach with the tip located in the expected region of the body of the stomach. Electronically Signed   By: Aletta Edouard M.D.   On: 01/19/2017 21:21      [facility], DAWOOD M.D on 01/22/2017 at 12:11 PM  Between 7am to 7pm - Pager - [phone]  After 7pm go to [email or web address] - password [facility]  [facility] -  Office  [phone]

## 2017-01-23 ENCOUNTER — Encounter (HOSPITAL_COMMUNITY): Payer: Self-pay

## 2017-01-23 DIAGNOSIS — E08621 Diabetes mellitus due to underlying condition with foot ulcer: Secondary | ICD-10-CM

## 2017-01-23 DIAGNOSIS — I483 Typical atrial flutter: Secondary | ICD-10-CM

## 2017-01-23 DIAGNOSIS — L97401 Non-pressure chronic ulcer of unspecified heel and midfoot limited to breakdown of skin: Secondary | ICD-10-CM

## 2017-01-23 DIAGNOSIS — L97309 Non-pressure chronic ulcer of unspecified ankle with unspecified severity: Secondary | ICD-10-CM

## 2017-01-23 LAB — BASIC METABOLIC PANEL
Anion gap: 9 (ref 5–15)
BUN: 40 mg/dL — ABNORMAL HIGH (ref 6–20)
CALCIUM: 8.8 mg/dL — AB (ref 8.9–10.3)
CO2: 27 mmol/L (ref 22–32)
Chloride: 106 mmol/L (ref 101–111)
Creatinine, Ser: 1.63 mg/dL — ABNORMAL HIGH (ref 0.44–1.00)
GFR, EST AFRICAN AMERICAN: 35 mL/min — AB (ref >60)
GFR, EST NON AFRICAN AMERICAN: 30 mL/min — AB (ref >60)
Glucose, Bld: 102 mg/dL — ABNORMAL HIGH (ref 65–99)
Potassium: 3.7 mmol/L (ref 3.5–5.1)
Sodium: 142 mmol/L (ref 135–145)

## 2017-01-23 LAB — GLUCOSE, CAPILLARY
GLUCOSE-CAPILLARY: 120 mg/dL — AB (ref 65–99)
GLUCOSE-CAPILLARY: 156 mg/dL — AB (ref 65–99)
Glucose-Capillary: 116 mg/dL — ABNORMAL HIGH (ref 65–99)
Glucose-Capillary: 105 mg/dL — ABNORMAL HIGH (ref 65–99)
Glucose-Capillary: 168 mg/dL — ABNORMAL HIGH (ref 65–99)
Glucose-Capillary: 195 mg/dL — ABNORMAL HIGH (ref 65–99)

## 2017-01-23 LAB — AEROBIC CULTURE  (SUPERFICIAL SPECIMEN)

## 2017-01-23 LAB — CBC
HEMATOCRIT: 27.1 % — AB (ref 36.0–46.0)
HEMOGLOBIN: 8.8 g/dL — AB (ref 12.0–15.0)
MCH: 28 pg (ref 26.0–34.0)
MCHC: 32.5 g/dL (ref 30.0–36.0)
MCV: 86.3 fL (ref 78.0–100.0)
Platelets: 366 10*3/uL (ref 150–400)
RBC: 3.14 MIL/uL — ABNORMAL LOW (ref 3.87–5.11)
RDW: 17.2 % — ABNORMAL HIGH (ref 11.5–15.5)
WBC: 11.7 10*3/uL — ABNORMAL HIGH (ref 4.0–10.5)

## 2017-01-23 LAB — AEROBIC CULTURE W GRAM STAIN (SUPERFICIAL SPECIMEN)

## 2017-01-23 MED ORDER — PREMIER PROTEIN SHAKE
11.0000 [oz_av] | Freq: Two times a day (BID) | ORAL | Status: DC
  Administered 2017-01-24 – 2017-01-25 (×2): 11 [oz_av] via ORAL
  Filled 2017-01-23 (×5): qty 325.31

## 2017-01-23 MED ORDER — METRONIDAZOLE 500 MG PO TABS
500.0000 mg | ORAL_TABLET | Freq: Three times a day (TID) | ORAL | Status: DC
  Administered 2017-01-23 – 2017-01-25 (×6): 500 mg via ORAL
  Filled 2017-01-23 (×6): qty 1

## 2017-01-23 MED ORDER — IPRATROPIUM-ALBUTEROL 0.5-2.5 (3) MG/3ML IN SOLN
3.0000 mL | Freq: Two times a day (BID) | RESPIRATORY_TRACT | Status: DC
  Administered 2017-01-24 – 2017-01-25 (×3): 3 mL via RESPIRATORY_TRACT
  Filled 2017-01-23 (×4): qty 3

## 2017-01-23 NOTE — Progress Notes (Signed)
Nutrition Follow-up  DOCUMENTATION CODES:   Morbid obesity  INTERVENTION:  - Will order Premier Protein BID, this supplement provides 160 kcal, 5 grams of carb, and 30 grams of protein. - Continue to encourage PO intakes of meals, specifically protein-containing foods. - RD will continue to monitor for needs.   NUTRITION DIAGNOSIS:   Increased nutrient needs related to wound healing as evidenced by estimated needs. -ongoing  GOAL:   Patient will meet greater than or equal to 90% of their needs -likely beginning to meet.   MONITOR:   PO intake, Supplement acceptance, Weight trends, Labs, Skin  ASSESSMENT:   76 y.o. female  with past medical history significant for hypertension, diabetes type 2, hyperlipidemia, coronary artery disease, atrial fibrillation and flutter and chronic lower extremity edema who who comes in for a lower extremity wound. Patient states she was in her usual state of health and feels at baseline but came in today at the urging of her home nurse. Patient states that her nurse said she has a wound that is covered with maggots that needs to get taken care of. Patient herself has no acute complaints. She does not have any pain in the leg. She denies even knowing that the wound was there until the nurse told her that it was.  8/13 Pt was extubated 8/10 at ~1055. Diet advanced 8/11 and pt was transferred to 4W that day. She returned to 2W yesterday d/t respiratory distress requiring BiPAP. Pt states she was told this AM that she may go back upstairs this afternoon. Weight now back to admission weight. Updated estimated needs based on extubation.   Per chart review, pt consumed 100% of breakfast on 8/11 and no other intakes documented since that time. She has a good appetite and has not been experiencing any throat pain with or without swallowing. She denies abdominal pain or nausea but states this AM she has felt abdominal pressure d/t "air" in her stomach. D/t this  sensation breakfast was grapes, juice, and coffee but she ordered lunch which is BBQ sandwich with fries and grapes. At home she was consuming more fruit in an attempt to lose weight. Will order supplement during hospitalization to aid in meeting estimated protein needs as PO intakes improve.    Medications reviewed; 80 mg IV Lasix/day, sliding scale Novolog. Labs reviewed; CBGs: 116 and 105 mg/dL, BUN: 40 mg/dL, creatinine: 8.651.63 mg/dL, Ca: 8.8 mg/dL, GFR: 35 mL/min.    8/10 - She had progressive respiratory distress/failure and was intubated in the ED following failed trial of BiPAP. - OGT placed at that time.  - No family/visitors present to provide information from PTA. - Physical assessment shows no muscle or fat wasting, mild edema throughout upper and lower body. - Per chart review, weight increased from 316 lb on 6/21 to 323 lb on 7/12.  - Weight again increased from 323 lb on 7/12 to 331 lb on 8/10.  - General Surgery following pt and PA note from this AM states no indication for wound debridement at this time.   Patient is currently intubated on ventilator support MV: 9.7 L/min Temp (24hrs), Avg:97.5 F (36.4 C), Min:96.8 F (36 C), Max:98.8 F (37.1 C) Propofol: none BP: 182/62 and MAP: 104  Phos: 4.7 mg/dL   Diet Order:  Diet Carb Modified Fluid consistency: Thin; Room service appropriate? Yes; Fluid restriction: 1200 mL Fluid  Skin:  Wound (see comment) (Stage 3 L buttocks, Stage 2 L thigh pressure injuries; L calf venous statsis ulcer)  Last BM:  PTA/unknown  Height:   Ht Readings from Last 1 Encounters:  01/19/17 5\' 8"  (1.727 m)    Weight:   Wt Readings from Last 1 Encounters:  01/23/17 (!) 328 lb 7.8 oz (149 kg)    Ideal Body Weight:  63.64 kg  BMI:  Body mass index is 49.95 kg/m.  Estimated Nutritional Needs:   Kcal:  1790-2085 (12-14 kcal/kg)  Protein:  120-130 grams  Fluid:  >/= 2 L/day  EDUCATION NEEDS:   No education needs identified at  this time    Trenton Gammon, MS, RD, LDN, CNSC Inpatient Clinical Dietitian Pager # [phone] After hours/weekend pager # [phone]

## 2017-01-23 NOTE — Care Management Note (Signed)
Case Management Note  Patient Details  Name: Arvella NighGloria C Jodoin MRN: [id number] Date of Birth: 05/29/1941  Subjective/Objective:                  40% fio2 failed Bipap  Action/Plan: Date:  January 23, 2017 Chart reviewed for concurrent status and case management needs. Will continue to follow patient progress. Discharge Planning: following for needs Expected discharge date: [id number] Marcelle SmilingRhonda Davis, BSN, [place]3, [place]   [phone]  Expected Discharge Date:   (unknown)               Expected Discharge Plan:  Home/Self Care  In-House Referral:     Discharge planning Services  CM Consult  Post Acute Care Choice:    Choice offered to:     DME Arranged:    DME Agency:     HH Arranged:    HH Agency:     Status of Service:  In process, will continue to follow  If discussed at Long Length of Stay Meetings, dates discussed:    Additional Comments:  Golda AcreDavis, Rhonda Lynn, RN 01/23/2017, 8:53 AM

## 2017-01-23 NOTE — Progress Notes (Signed)
PROGRESS NOTE                                                                                                                                                                                                             Patient Demographics:    Jamie Mason, is a 76 y.o. female, DOB - Feb 25, 1941, TVI:[id number]  Admit date - 01/19/2017   Admitting Physician Rigoberto Noel, MD  Outpatient Primary MD for the patient is Bartholomew Crews, MD  LOS - 4   No chief complaint on file.       Brief Narrative   76 y.o. female  with past medical history significant for hypertension, diabetes type 2, hyperlipidemia, coronary artery disease, atrial fibrillation and flutter and chronic lower extremity edema who Was sent by her visiting home nurse for infected lotion that he wants with maggots, she developed respiratory distress in ED secondary to pulmonary edema, failed BiPAP, required intubation and admitted to ICU , she was extubated and transferred [facility] to [facility], she is on some respiratory distress in a.m., transferred to step down for BiPAP.   Subjective:    Jamie Mason today Reports some dyspnea this a.m., she denies any chest pain cough, no palpitation , no fever or chills .   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 - Patient and son respiratory distress this a.m., tachypnea, to be transferred back to step down for BiPAP.Patient refusing BiPAP, requiring 2 L of nasal cannula and saturating 97% -  Continue with current dose IV diuresis. -Patient will need sleep study as an outpatient as she likely has OSA per PCCM.  Acute on chronic diastolic CHF - Continue current dose Lasix 80 mg IV daily , baseline creatinine is about 1.5, increased to 2.07, now 1.63   continue to monitor daily weights, strict ins and outs. 2-D echo shows EF of 55-60%  AKI on  CK D stage III - Baseline creatinine 1.4-1.5, continue IV diuresis, creatinine  improved from 2-1.63  Diabetic foot ulcer - Input appreciated, no need for debridement, continue with local wound care - Continue with Rocephin and Flagyl,No evidence of osteomyelitis on plain films. -Leukocytosis improving, down from 20.0->11.7  DM - Hold metformin. Continue with insulin sliding scale   HTN - Blood pressure controlled, Continue hydralazine, diltiazem, Lasix, carvedilol  CAD - Continue carvedilol and atorvastatin and aspirin.  Atrial fibrillation - Continue amiodarone, diltiazem , carvedilol for heart rate control  - Resume on Eliquis  Hyperlipidemia - Continue Atorvastatin     Code Status : Full  Family Communication  : Discussed with patient this a.m., none at bedside  Disposition Plan  patient is doing well on nasal cannula, transferred back to telemetry  Consults  :  Surgery, PC CM  Procedures  : Intubation/extubation by PCCM  DVT Prophylaxis  :  On a liquid  Lab Results  Component Value Date   PLT 366 01/23/2017    Antibiotics  :    Anti-infectives    Start     Dose/Rate Route Frequency Ordered Stop   01/20/17 1400  vancomycin (VANCOCIN) 1,750 mg in sodium chloride 0.9 % 500 mL IVPB  Status:  Discontinued     1,750 mg 250 mL/hr over 120 Minutes Intravenous Every 24 hours 01/19/17 1843 01/21/17 0851   01/19/17 2200  cefTRIAXone (ROCEPHIN) 2 g in dextrose 5 % 50 mL IVPB     2 g 100 mL/hr over 30 Minutes Intravenous Every 24 hours 01/19/17 2142     01/19/17 2000  metroNIDAZOLE (FLAGYL) IVPB 500 mg     500  mg 100 mL/hr over 60 Minutes Intravenous Every 8 hours 01/19/17 1808     01/19/17 2000  ceFEPIme (MAXIPIME) 2 g in dextrose 5 % 50 mL IVPB  Status:  Discontinued     2 g 100 mL/hr over 30 Minutes Intravenous Every 12 hours 01/19/17 1843 01/19/17 2124   01/19/17 1815  vancomycin (VANCOCIN) IVPB 1000 mg/200 mL premix  Status:  Discontinued     1,000 mg 200 mL/hr over 60 Minutes Intravenous Every 8 hours 01/19/17 1808 01/19/17 1843   01/19/17 1800  ciprofloxacin (CIPRO) IVPB 400 mg  Status:  Discontinued     400 mg 200 mL/hr over 60 Minutes Intravenous Every 12 hours 01/19/17 1808 01/19/17 1833   01/19/17 1445  ciprofloxacin (CIPRO) IVPB 400 mg     400 mg 200 mL/hr over 60 Minutes Intravenous  Once 01/19/17 1434 01/19/17 1733   01/19/17 1445  vancomycin (VANCOCIN) 2,000 mg in sodium chloride 0.9 % 500 mL IVPB     2,000 mg 250 mL/hr over 120 Minutes Intravenous  Once 01/19/17 1441 01/19/17 1931        Objective:   Vitals:   01/23/17 0400 01/23/17 0500 01/23/17 0600 01/23/17 0800  BP: (!) 151/74  (!) 128/55 (!) 141/54  Pulse: 92  92   Resp: 14  16   Temp:    98.2 F (36.8 C)  TempSrc:    Oral  SpO2: 97%  97%   Weight:  (!) 149 kg (328 lb 7.8 oz)    Height:        Wt Readings from Last 3 Encounters:  01/23/17 (!) 149 kg (328 lb 7.8 oz)  01/19/17 (!) 148.8 kg (328 lb)  12/23/16 (!) 147 kg (324 lb 1.6 oz)     Intake/Output Summary (Last 24 hours) at 01/23/17 0932  Last data filed at 01/23/17 0600  Gross per 24 hour  Intake             1460 ml  Output             2300 ml  Net             -840 ml     Physical Exam  Awake Alert, Oriented X 3,In mild respiratory distress this a.m. Symmetrical Chest wall movement, acute neck with some use of accessory muscle, no wheezing,  bibasilar crackles . RRR,No Gallops,Rubs or new Murmurs, No Parasternal Heave +ve B.Sounds, Abd Soft, No tenderness, No rebound - guarding or rigidity. No Cyanosis, Clubbing or edema, wound in  left lower  extremity bandaged   Data Review:    CBC  Recent Labs Lab 01/19/17 1535 01/20/17 0330 01/21/17 0328 01/23/17 0354  WBC 9.6 15.0* 20.0* 11.7*  HGB 8.2* 9.2* 8.0* 8.8*  HCT 24.7* 28.7* 24.0* 27.1*  PLT 332 332 331 366  MCV 86.1 85.4 85.7 86.3  MCH 28.6 27.4 28.6 28.0  MCHC 33.2 32.1 33.3 32.5  RDW 17.1* 17.1* 17.0* 17.2*  LYMPHSABS 1.4  --   --   --   MONOABS 0.5  --   --   --   EOSABS 0.2  --   --   --   BASOSABS 0.0  --   --   --     Chemistries   Recent Labs Lab 01/19/17 1535 01/19/17 2246 01/20/17 0717 01/21/17 0328 01/22/17 0452 01/23/17 0354  NA 143  --  144 141 141 142  K 3.7  --  4.0 4.6 4.1 3.7  CL 107  --  106 104 106 106  CO2 28  --  '26 24 23 27  ' GLUCOSE 122*  --  170* 132* 129* 102*  BUN 31*  --  30* 40* 51* 40*  CREATININE 1.76*  --  1.71* 1.99* 2.07* 1.63*  CALCIUM 9.0  --  8.9 8.7* 8.4* 8.8*  MG  --  2.0 2.0  --   --   --    ------------------------------------------------------------------------------------------------------------------ No results for input(s): CHOL, HDL, LDLCALC, TRIG, CHOLHDL, LDLDIRECT in the last 72 hours.  Lab Results  Component Value Date   HGBA1C 6.8 10/06/2016   ------------------------------------------------------------------------------------------------------------------ No results for input(s): TSH, T4TOTAL, T3FREE, THYROIDAB in the last 72 hours.  Invalid input(s): FREET3 ------------------------------------------------------------------------------------------------------------------ No results for input(s): VITAMINB12, FOLATE, FERRITIN, TIBC, IRON, RETICCTPCT in the last 72 hours.  Coagulation profile No results for input(s): INR, PROTIME in the last 168 hours.  No results for input(s): DDIMER in the last 72 hours.  Cardiac Enzymes  Recent Labs Lab 01/19/17 2246 01/20/17 0330 01/20/17 0854  TROPONINI <0.03 <0.03 <0.03    ------------------------------------------------------------------------------------------------------------------    Component Value Date/Time   BNP 812.0 (H) 01/21/2017 0328    Inpatient Medications  Scheduled Meds: . amiodarone  200 mg Oral Daily  . apixaban  5 mg Oral BID  . arformoterol  15 mcg Nebulization BID  . aspirin  81 mg Oral Daily  . atorvastatin  40 mg Oral q1800  . budesonide (PULMICORT) nebulizer solution  0.5 mg Nebulization BID  . carvedilol  25 mg Oral BID WC  . chlorhexidine gluconate (MEDLINE KIT)  15 mL Mouth Rinse BID  . diltiazem  60 mg Oral Q6H  . furosemide  80 mg Intravenous Daily  . hydrALAZINE  50 mg Oral Q8H  . insulin aspart  0-15 Units Subcutaneous Q4H  .  ipratropium-albuterol  3 mL Nebulization TID  . mouth rinse  15 mL Mouth Rinse QID   Continuous Infusions: . sodium chloride 250 mL (01/22/17 1800)  . cefTRIAXone (ROCEPHIN)  IV Stopped (01/22/17 2151)  . metronidazole Stopped (01/23/17 0441)   PRN Meds:.sodium chloride, albuterol, bisacodyl, hydrALAZINE, hydrOXYzine, simethicone  Micro Results Recent Results (from the past 240 hour(s))  Culture, blood (Routine X 2) w Reflex to ID Panel     Status: None (Preliminary result)   Collection Time: 01/19/17 10:45 PM  Result Value Ref Range Status   Specimen Description BLOOD BLOOD RIGHT HAND  Final   Special Requests   Final    BOTTLES DRAWN AEROBIC AND ANAEROBIC Blood Culture adequate volume   Culture   Final    NO GROWTH 2 DAYS Performed at [facility], [place] [address] [place], [place] [address]    Report Status PENDING  Incomplete  Culture, blood (Routine X 2) w Reflex to ID Panel     Status: None (Preliminary result)   Collection Time: 01/19/17 10:47 PM  Result Value Ref Range Status   Specimen Description BLOOD RIGHT ANTECUBITAL  Final   Special Requests IN PEDIATRIC BOTTLE Blood Culture adequate volume  Final   Culture   Final    NO GROWTH 2 DAYS Performed at [facility], [place] [address] [place], [place] [address]    Report Status PENDING  Incomplete  Aerobic Culture (superficial specimen)     Status: None (Preliminary result)   Collection Time: 01/19/17 11:12 PM  Result Value Ref Range Status   Specimen Description WOUND LEG LEFT  Final   Special Requests NONE  Final   Gram Stain   Final    ABUNDANT WBC PRESENT,BOTH PMN AND MONONUCLEAR ABUNDANT GRAM POSITIVE COCCI IN PAIRS IN CLUSTERS MODERATE GRAM VARIABLE ROD    Culture   Final    RARE STAPHYLOCOCCUS AUREUS SUSCEPTIBILITIES TO FOLLOW Performed at [facility], [place] [address] [place], [place] [address]    Report Status PENDING  Incomplete  MRSA PCR Screening     Status: Abnormal   Collection Time: 01/20/17 12:44 AM  Result Value Ref Range Status   MRSA by PCR INVALID RESULTS, SPECIMEN SENT FOR CULTURE (A) NEGATIVE Final    Comment:        The GeneXpert MRSA Assay (FDA approved for NASAL specimens only), is one component of a comprehensive MRSA colonization surveillance program. It is not intended to diagnose MRSA infection nor to guide or monitor treatment for MRSA infections.   MRSA culture     Status: None   Collection Time: 01/20/17 12:44 AM  Result Value Ref Range Status   Specimen Description NOSE  Final   Special Requests NONE  Final   Culture   Final    NO MRSA DETECTED Performed at [facility], [address], [place], [place] [address]    Report Status 01/21/2017 FINAL  Final  Culture, respiratory (NON-Expectorated)     Status: None   Collection Time: 01/20/17  5:25 AM  Result Value Ref Range Status   Specimen Description TRACHEAL ASPIRATE  Final   Special Requests NONE  Final   Gram Stain   Final    RARE WBC PRESENT,BOTH PMN AND MONONUCLEAR NO ORGANISMS SEEN    Culture   Final    Consistent with normal respiratory flora. Performed at [facility], [place] [address], [place], [place] [address]    Report Status  01/22/2017 FINAL  Final     Radiology Reports Dg Tibia/fibula Left  Result Date: 01/19/2017 CLINICAL DATA:  Nonhealing wound of distal left lower leg. EXAM: LEFT TIBIA AND FIBULA - 2 VIEW COMPARISON:  06/26/2016 FINDINGS: There is no evidence of acute fracture, subluxation or dislocation. No radiographic evidence of osteomyelitis noted. Degenerative changes in the knee noted. Calcaneal spur is again noted. No radiopaque foreign bodies are identified. IMPRESSION: No evidence of acute bony abnormality. Electronically Signed   By: Margarette Canada M.D.   On: 01/19/2017 15:40   Dg Chest Port 1 View  Result Date: 01/22/2017 CLINICAL DATA:  Shortness of breath today. EXAM: PORTABLE CHEST 1 VIEW COMPARISON:  01/21/2017. FINDINGS: Stable enlarged cardiac silhouette. Mild increase in prominence of the interstitial markings. No pleural fluid. The right hilum appears less prominent today. Unremarkable bones. IMPRESSION: Stable cardiomegaly with interval probable mild interstitial pulmonary edema. Electronically Signed   By: Claudie Revering M.D.   On: 01/22/2017 10:53   Dg Chest Port 1 View  Result Date: 01/21/2017 CLINICAL DATA:  Pulmonary edema. EXAM: PORTABLE CHEST 1 VIEW COMPARISON:  01/20/2017, 07/06/2016 and 03/19/2014 FINDINGS: Patient slightly rotated left. Lungs are adequately inflated demonstrate stable hazy prominence of the central pulmonary vessels. No lobar consolidation, effusion or pneumothorax. Prominent right hilum unchanged from recent exams although worse compared to 03/19/2014. Mild stable cardiomegaly. Calcified plaque over the aortic arch. Degenerative change of the spine. IMPRESSION: Stable hazy prominence of the central pulmonary vessels suggesting mild vascular congestion. Mild stable cardiomegaly. Prominence of the right hilum. Consider PA and lateral chest radiograph when feasible for better evaluation versus contrast-enhanced chest CT for further evaluation. Aortic Atherosclerosis (ICD10-I70.0). Electronically Signed    By: Marin Olp M.D.   On: 01/21/2017 07:16   Dg Chest Port 1 View  Result Date: 01/20/2017 CLINICAL DATA:  Shortness of breath.  Pulmonary edema. EXAM: PORTABLE CHEST 1 VIEW COMPARISON:  Multiple prior chest x-rays, most recently earlier today. FINDINGS: Interval removal of the endotracheal and enteric tubes. Cardiomegaly, unchanged. Right greater than left perihilar opacities, similar to prior study. Bibasilar atelectasis. No definite pleural effusion. No acute osseous abnormality. IMPRESSION: New 1. Stable cardiomegaly and bilateral predominantly perihilar densities, likely edema. 2. No definite pneumothorax. Electronically Signed   By: Titus Dubin M.D.   On: 01/20/2017 12:33   Dg Chest Port 1 View  Result Date: 01/20/2017 CLINICAL DATA:  Respiratory failure EXAM: PORTABLE CHEST 1 VIEW COMPARISON:  01/19/2017 FINDINGS: Endotracheal and NG tubes are stable. Diffuse extensive bilateral airspace disease is stable. There is lucency projecting over the right hilum. Developing pneumothorax in a supine patient is not excluded. Linear atelectasis in left mid lung is stable. Cardiomegaly. IMPRESSION: Stable cardiomegaly and bilateral airspace disease. Stable support apparatus Possible focal pneumothorax on the right. Attention on follow-up studies is recommended. Electronically Signed   By: Marybelle Killings M.D.   On: 01/20/2017 07:46   Dg Chest Portable 1 View  Result Date: 01/19/2017 CLINICAL DATA:  Retraction of endotracheal tube. EXAM: PORTABLE CHEST 1 VIEW COMPARISON:  Film earlier today at 2046 hours FINDINGS: Endotracheal tube tip now projects approximately 4.5 cm above the carina. Lungs demonstrate some improvement in pulmonary edema since the prior study with moderate edema remaining. Orogastric tube extends below the diaphragm. No significant pleural fluid identified. IMPRESSION: Endotracheal tube tip approximately 4.5 cm above the carina. Decrease in pulmonary edema since the prior study.  Electronically Signed   By: Aletta Edouard M.D.   On: 01/19/2017 22:16  Dg Chest Portable 1 View  Result Date: 01/19/2017 CLINICAL DATA:  Respiratory failure and status post intubation. EXAM: PORTABLE CHEST 1 VIEW COMPARISON:  10/27/2016 FINDINGS: Endotracheal tube present with the tip approximately 3.5 cm above the carina. Nasogastric tube extends below the diaphragm. The heart is mildly enlarged. Lungs show diffuse pulmonary edema. No pneumothorax or large pleural effusions. IMPRESSION: Endotracheal tube tip is approximately 3.5 cm above the carina. Lungs demonstrate diffuse pulmonary edema. Electronically Signed   By: Aletta Edouard M.D.   On: 01/19/2017 21:18   Dg Abd Portable 1 View  Result Date: 01/19/2017 CLINICAL DATA:  Orogastric tube placement. EXAM: PORTABLE ABDOMEN - 1 VIEW COMPARISON:  None. FINDINGS: Orogastric tube extends into the stomach with the tip likely located in the body of the stomach. Other visualized bowel gas is unremarkable. IMPRESSION: Orogastric tube extends into the stomach with the tip located in the expected region of the body of the stomach. Electronically Signed   By: Aletta Edouard M.D.   On: 01/19/2017 21:21      Reyne Dumas M.D on 01/23/2017 at 8:07 AM  Between 7am to 7pm - Pager - [phone]  After 7pm go to [email or web address] - password [facility]  [facility] -  Office  [phone]

## 2017-01-23 NOTE — Progress Notes (Signed)
Pt. wanted BiPAP removed was unable to tolerate, c/o of H/A from head straps.

## 2017-01-24 ENCOUNTER — Inpatient Hospital Stay (HOSPITAL_COMMUNITY): Payer: Medicare HMO

## 2017-01-24 LAB — COMPREHENSIVE METABOLIC PANEL
ALT: 27 U/L (ref 14–54)
AST: 29 U/L (ref 15–41)
Albumin: 3.1 g/dL — ABNORMAL LOW (ref 3.5–5.0)
Alkaline Phosphatase: 69 U/L (ref 38–126)
Anion gap: 7 (ref 5–15)
BUN: 31 mg/dL — AB (ref 6–20)
CO2: 28 mmol/L (ref 22–32)
Calcium: 8.7 mg/dL — ABNORMAL LOW (ref 8.9–10.3)
Chloride: 109 mmol/L (ref 101–111)
Creatinine, Ser: 1.43 mg/dL — ABNORMAL HIGH (ref 0.44–1.00)
GFR calc Af Amer: 41 mL/min — ABNORMAL LOW (ref >60)
GFR calc non Af Amer: 35 mL/min — ABNORMAL LOW (ref >60)
GLUCOSE: 128 mg/dL — AB (ref 65–99)
POTASSIUM: 3.8 mmol/L (ref 3.5–5.1)
SODIUM: 144 mmol/L (ref 135–145)
TOTAL PROTEIN: 7 g/dL (ref 6.5–8.1)
Total Bilirubin: 0.5 mg/dL (ref 0.3–1.2)

## 2017-01-24 LAB — GLUCOSE, CAPILLARY
GLUCOSE-CAPILLARY: 115 mg/dL — AB (ref 65–99)
Glucose-Capillary: 111 mg/dL — ABNORMAL HIGH (ref 65–99)
Glucose-Capillary: 112 mg/dL — ABNORMAL HIGH (ref 65–99)
Glucose-Capillary: 175 mg/dL — ABNORMAL HIGH (ref 65–99)
Glucose-Capillary: 142 mg/dL — ABNORMAL HIGH (ref 65–99)

## 2017-01-24 MED ORDER — FUROSEMIDE 10 MG/ML IJ SOLN
60.0000 mg | Freq: Two times a day (BID) | INTRAMUSCULAR | Status: DC
  Administered 2017-01-24: 60 mg via INTRAVENOUS
  Filled 2017-01-24: qty 6

## 2017-01-24 NOTE — Progress Notes (Signed)
PROGRESS NOTE                                                                                                                                                                                                             Patient Demographics:    Jamie Mason, is a 77 y.o. female, DOB - 05/13/1941, UWT:[id number]  Admit date - 01/19/2017   Admitting Physician Rigoberto Noel, MD  Outpatient Primary MD for the patient is Bartholomew Crews, MD  LOS - 5   No chief complaint on file.       Brief Narrative   76 y.o. female  with past medical history significant for hypertension, diabetes type 2, hyperlipidemia, coronary artery disease, atrial fibrillation and flutter and chronic lower extremity edema who Was sent by her visiting home nurse Patient states that her nurse said she has a wound that is covered with maggots that needs to get taken care of. , she developed respiratory distress in ED secondary to pulmonary edema, failed BiPAP, required intubation and admitted to ICU , she was extubated and transferred [facility], she is on some respiratory distress in a.m., transferred to step down for BiPAP.   Subjective:    Jamie Mason still reports SOB ., she denies any chest pain cough, no palpitation , no fever or chills .   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 - Patient Has improved,, off  BiPAP transfer to telemetry, now transition to room air - Continue lasix 60 mg iv BID  -Patient will  need sleep study as an outpatient as she likely has OSA per PCCM.  Acute on chronic diastolic CHF - Continue current dose Lasix 80 mg IV daily , baseline creatinine is about 1.5, increased to 2.07, now 1.43   continue to monitor daily weights, strict ins and outs. 2-D echo shows EF of 55-60% Change Lasix to  oral in a.m.  AKI on CK D stage III - Baseline creatinine 1.4-1.5, continue IV diuresis, creatinine  improved from 2>-1.63>1.43  Diabetic foot ulcer, wound culture showing staph aureus pansensitive - Input appreciated, no need for debridement, continue with local wound care -Change Rocephin to doxycycline and continue Flagyl, No evidence of osteomyelitis on plain films. -Leukocytosis improving, down from 20.0->11.7  DM - Hold metformin given renal insufficiency. Continue with insulin sliding scale   HTN - Blood pressure controlled, Continue hydralazine, diltiazem, Lasix, carvedilol  CAD - Continue carvedilol and atorvastatin and aspirin.  Atrial fibrillation - Continue amiodarone, diltiazem , carvedilol for heart rate control  - Resume on Eliquis  Hyperlipidemia - Continue Atorvastatin     Code Status : Full  Family Communication  : Discussed with patient this a.m., none at bedside  Disposition Plan   patient can be discharged tomorrow if stable overnight, with home health PT  Consults  :  Surgery, PC CM  Procedures  : Intubation/extubation by PCCM  DVT Prophylaxis  :  On a liquid  Lab Results  Component Value Date   PLT 366 01/23/2017    Antibiotics  :    Anti-infectives    Start     Dose/Rate Route Frequency Ordered Stop   01/23/17 1400  metroNIDAZOLE (FLAGYL) tablet 500 mg     500 mg Oral Every 8 hours 01/23/17 0819     01/20/17 1400  vancomycin (VANCOCIN) 1,750 mg in sodium chloride 0.9 % 500 mL IVPB  Status:  Discontinued     1,750 mg 250 mL/hr over 120 Minutes Intravenous Every 24 hours 01/19/17 1843 01/21/17 0851   01/19/17 2200  cefTRIAXone  (ROCEPHIN) 2 g in dextrose 5 % 50 mL IVPB     2 g 100 mL/hr over 30 Minutes Intravenous Every 24 hours 01/19/17 2142     01/19/17 2000  metroNIDAZOLE (FLAGYL) IVPB 500 mg  Status:  Discontinued     500 mg 100 mL/hr over 60 Minutes Intravenous Every 8 hours 01/19/17 1808 01/23/17 0819   01/19/17 2000  ceFEPIme (MAXIPIME) 2 g in dextrose 5 % 50 mL IVPB  Status:  Discontinued     2 g 100 mL/hr over 30 Minutes Intravenous Every 12 hours 01/19/17 1843 01/19/17 2124   01/19/17 1815  vancomycin (VANCOCIN) IVPB 1000 mg/200 mL premix  Status:  Discontinued     1,000 mg 200 mL/hr over 60 Minutes Intravenous Every 8 hours 01/19/17 1808 01/19/17 1843   01/19/17 1800  ciprofloxacin (CIPRO) IVPB 400 mg  Status:  Discontinued     400 mg 200 mL/hr over 60 Minutes Intravenous Every 12 hours 01/19/17 1808 01/19/17 1833   01/19/17 1445  ciprofloxacin (CIPRO) IVPB 400 mg     400 mg 200 mL/hr over 60 Minutes Intravenous  Once 01/19/17 1434 01/19/17 1733   01/19/17 1445  vancomycin (VANCOCIN) 2,000 mg in sodium chloride 0.9 % 500 mL IVPB     2,000 mg 250 mL/hr over 120 Minutes Intravenous  Once 01/19/17 1441 01/19/17 1931        Objective:   Vitals:   01/23/17 2133 01/23/17 2341 01/24/17 0617 01/24/17 0810  BP: 132/66  (!) 143/67   Pulse: 90 91 92 91  Resp: 18   19  Temp: 98.6 F (37 C)  98.6 F (37 C)   TempSrc: Oral  Oral   SpO2: 100%  96% 97%  Weight:   (!) 148.8 kg (328 lb)   Height:  Wt Readings from Last 3 Encounters:  01/24/17 (!) 148.8 kg (328 lb)  01/19/17 (!) 148.8 kg (328 lb)  12/23/16 (!) 147 kg (324 lb 1.6 oz)     Intake/Output Summary (Last 24 hours) at 01/24/17 1201 Last data filed at 01/24/17 0620  Gross per 24 hour  Intake               50 ml  Output              575 ml  Net             -525 ml     Physical Exam  Awake Alert, Oriented X 3,In mild respiratory distress this a.m. Symmetrical Chest wall movement, acute neck with some use of accessory muscle, no  wheezing,  bibasilar crackles . RRR,No Gallops,Rubs or new Murmurs, No Parasternal Heave +ve B.Sounds, Abd Soft, No tenderness, No rebound - guarding or rigidity. No Cyanosis, Clubbing or edema, wound in  left lower extremity bandaged   Data Review:    CBC  Recent Labs Lab 01/19/17 1535 01/20/17 0330 01/21/17 0328 01/23/17 0354  WBC 9.6 15.0* 20.0* 11.7*  HGB 8.2* 9.2* 8.0* 8.8*  HCT 24.7* 28.7* 24.0* 27.1*  PLT 332 332 331 366  MCV 86.1 85.4 85.7 86.3  MCH 28.6 27.4 28.6 28.0  MCHC 33.2 32.1 33.3 32.5  RDW 17.1* 17.1* 17.0* 17.2*  LYMPHSABS 1.4  --   --   --   MONOABS 0.5  --   --   --   EOSABS 0.2  --   --   --   BASOSABS 0.0  --   --   --     Chemistries   Recent Labs Lab 01/19/17 2246 01/20/17 0717 01/21/17 0328 01/22/17 0452 01/23/17 0354 01/24/17 0511  NA  --  144 141 141 142 144  K  --  4.0 4.6 4.1 3.7 3.8  CL  --  106 104 106 106 109  CO2  --  '26 24 23 27 28  ' GLUCOSE  --  170* 132* 129* 102* 128*  BUN  --  30* 40* 51* 40* 31*  CREATININE  --  1.71* 1.99* 2.07* 1.63* 1.43*  CALCIUM  --  8.9 8.7* 8.4* 8.8* 8.7*  MG 2.0 2.0  --   --   --   --   AST  --   --   --   --   --  29  ALT  --   --   --   --   --  27  ALKPHOS  --   --   --   --   --  69  BILITOT  --   --   --   --   --  0.5   ------------------------------------------------------------------------------------------------------------------ No results for input(s): CHOL, HDL, LDLCALC, TRIG, CHOLHDL, LDLDIRECT in the last 72 hours.  Lab Results  Component Value Date   HGBA1C 6.8 10/06/2016   ------------------------------------------------------------------------------------------------------------------ No results for input(s): TSH, T4TOTAL, T3FREE, THYROIDAB in the last 72 hours.  Invalid input(s): FREET3 ------------------------------------------------------------------------------------------------------------------ No results for input(s): VITAMINB12, FOLATE, FERRITIN, TIBC, IRON,  RETICCTPCT in the last 72 hours.  Coagulation profile No results for input(s): INR, PROTIME in the last 168 hours.  No results for input(s): DDIMER in the last 72 hours.  Cardiac Enzymes  Recent Labs Lab 01/19/17 2246 01/20/17 0330 01/20/17 0854  TROPONINI <0.03 <0.03 <0.03   ------------------------------------------------------------------------------------------------------------------    Component Value Date/Time   BNP 812.0 (H)  01/21/2017 0328    Inpatient Medications  Scheduled Meds: . amiodarone  200 mg Oral Daily  . apixaban  5 mg Oral BID  . arformoterol  15 mcg Nebulization BID  . aspirin  81 mg Oral Daily  . atorvastatin  40 mg Oral q1800  . budesonide (PULMICORT) nebulizer solution  0.5 mg Nebulization BID  . carvedilol  25 mg Oral BID WC  . chlorhexidine gluconate (MEDLINE KIT)  15 mL Mouth Rinse BID  . diltiazem  60 mg Oral Q6H  . furosemide  80 mg Intravenous Daily  . hydrALAZINE  50 mg Oral Q8H  . insulin aspart  0-15 Units Subcutaneous Q4H  . ipratropium-albuterol  3 mL Nebulization BID  . mouth rinse  15 mL Mouth Rinse QID  . metroNIDAZOLE  500 mg Oral Q8H  . protein supplement shake  11 oz Oral BID BM   Continuous Infusions: . sodium chloride 250 mL (01/22/17 1800)  . cefTRIAXone (ROCEPHIN)  IV Stopped (01/23/17 2304)   PRN Meds:.sodium chloride, albuterol, bisacodyl, hydrALAZINE, hydrOXYzine, simethicone  Micro Results Recent Results (from the past 240 hour(s))  Culture, blood (Routine X 2) w Reflex to ID Panel     Status: None (Preliminary result)   Collection Time: 01/19/17 10:45 PM  Result Value Ref Range Status   Specimen Description BLOOD BLOOD RIGHT HAND  Final   Special Requests   Final    BOTTLES DRAWN AEROBIC AND ANAEROBIC Blood Culture adequate volume   Culture   Final    NO GROWTH 3 DAYS Performed at [facility], [place] [address], [place], [place] [address]    Report Status PENDING  Incomplete  Culture, blood (Routine X  2) w Reflex to ID Panel     Status: None (Preliminary result)   Collection Time: 01/19/17 10:47 PM  Result Value Ref Range Status   Specimen Description BLOOD RIGHT ANTECUBITAL  Final   Special Requests IN PEDIATRIC BOTTLE Blood Culture adequate volume  Final   Culture   Final    NO GROWTH 3 DAYS Performed at [facility], [place] [address] [place], [place] [address]    Report Status PENDING  Incomplete  Aerobic Culture (superficial specimen)     Status: None   Collection Time: 01/19/17 11:12 PM  Result Value Ref Range Status   Specimen Description WOUND LEG LEFT  Final   Special Requests NONE  Final   Gram Stain   Final    ABUNDANT WBC PRESENT,BOTH PMN AND MONONUCLEAR ABUNDANT GRAM POSITIVE COCCI IN PAIRS IN CLUSTERS MODERATE GRAM VARIABLE ROD    Culture   Final    RARE STAPHYLOCOCCUS AUREUS WITH MIXED BACTERIAL ORGANISMS    Report Status 01/23/2017 FINAL  Final   Organism ID, Bacteria STAPHYLOCOCCUS AUREUS  Final      Susceptibility   Staphylococcus aureus - MIC*    CIPROFLOXACIN <=0.5 SENSITIVE Sensitive     ERYTHROMYCIN <=0.25 SENSITIVE Sensitive     GENTAMICIN <=0.5 SENSITIVE Sensitive     OXACILLIN 0.5 SENSITIVE Sensitive     TETRACYCLINE <=1 SENSITIVE Sensitive     VANCOMYCIN <=0.5 SENSITIVE Sensitive     TRIMETH/SULFA <=10 SENSITIVE Sensitive     CLINDAMYCIN <=0.25 SENSITIVE Sensitive     RIFAMPIN <=0.5 SENSITIVE Sensitive     Inducible Clindamycin NEGATIVE Sensitive     * RARE STAPHYLOCOCCUS AUREUS  MRSA PCR Screening     Status: Abnormal   Collection Time: 01/20/17 12:44 AM  Result Value Ref Range Status  MRSA by PCR INVALID RESULTS, SPECIMEN SENT FOR CULTURE (A) NEGATIVE Final    Comment:        The GeneXpert MRSA Assay (FDA approved for NASAL specimens only), is one component of a comprehensive MRSA colonization surveillance program. It is not intended to diagnose MRSA infection nor to guide or monitor treatment for MRSA infections.   MRSA  culture     Status: None   Collection Time: 01/20/17 12:44 AM  Result Value Ref Range Status   Specimen Description NOSE  Final   Special Requests NONE  Final   Culture   Final    NO MRSA DETECTED Performed at [facility], [address] [place], [place] [address]    Report Status 01/21/2017 FINAL  Final  Culture, respiratory (NON-Expectorated)     Status: None   Collection Time: 01/20/17  5:25 AM  Result Value Ref Range Status   Specimen Description TRACHEAL ASPIRATE  Final   Special Requests NONE  Final   Gram Stain   Final    RARE WBC PRESENT,BOTH PMN AND MONONUCLEAR NO ORGANISMS SEEN    Culture   Final    Consistent with normal respiratory flora. Performed at [facility], [place] [address] [place], [place] [address]    Report Status 01/22/2017 FINAL  Final    Radiology Reports Dg Tibia/fibula Left  Result Date: 01/19/2017 CLINICAL DATA:  Nonhealing wound of distal left lower leg. EXAM: LEFT TIBIA AND FIBULA - 2 VIEW COMPARISON:  06/26/2016 FINDINGS: There is no evidence of acute fracture, subluxation or dislocation. No radiographic evidence of osteomyelitis noted. Degenerative changes in the knee noted. Calcaneal spur is again noted. No radiopaque foreign bodies are identified. IMPRESSION: No evidence of acute bony abnormality. Electronically Signed   By: Margarette Canada M.D.   On: 01/19/2017 15:40   Dg Chest Port 1 View  Result Date: 01/22/2017 CLINICAL DATA:  Shortness of breath today. EXAM: PORTABLE CHEST 1 VIEW COMPARISON:  01/21/2017. FINDINGS: Stable enlarged cardiac silhouette. Mild increase in prominence of the interstitial markings. No pleural fluid. The right hilum appears less prominent today. Unremarkable bones. IMPRESSION: Stable cardiomegaly with interval probable mild interstitial pulmonary edema. Electronically Signed   By: Claudie Revering M.D.   On: 01/22/2017 10:53   Dg Chest Port 1 View  Result Date: 01/21/2017 CLINICAL DATA:  Pulmonary edema. EXAM:  PORTABLE CHEST 1 VIEW COMPARISON:  01/20/2017, 07/06/2016 and 03/19/2014 FINDINGS: Patient slightly rotated left. Lungs are adequately inflated demonstrate stable hazy prominence of the central pulmonary vessels. No lobar consolidation, effusion or pneumothorax. Prominent right hilum unchanged from recent exams although worse compared to 03/19/2014. Mild stable cardiomegaly. Calcified plaque over the aortic arch. Degenerative change of the spine. IMPRESSION: Stable hazy prominence of the central pulmonary vessels suggesting mild vascular congestion. Mild stable cardiomegaly. Prominence of the right hilum. Consider PA and lateral chest radiograph when feasible for better evaluation versus contrast-enhanced chest CT for further evaluation. Aortic Atherosclerosis (ICD10-I70.0). Electronically Signed   By: Marin Olp M.D.   On: 01/21/2017 07:16   Dg Chest Port 1 View  Result Date: 01/20/2017 CLINICAL DATA:  Shortness of breath.  Pulmonary edema. EXAM: PORTABLE CHEST 1 VIEW COMPARISON:  Multiple prior chest x-rays, most recently earlier today. FINDINGS: Interval removal of the endotracheal and enteric tubes. Cardiomegaly, unchanged. Right greater than left perihilar opacities, similar to prior study. Bibasilar atelectasis. No definite pleural effusion. No acute osseous abnormality. IMPRESSION: New 1. Stable cardiomegaly and bilateral predominantly perihilar densities,  likely edema. 2. No definite pneumothorax. Electronically Signed   By: Titus Dubin M.D.   On: 01/20/2017 12:33   Dg Chest Port 1 View  Result Date: 01/20/2017 CLINICAL DATA:  Respiratory failure EXAM: PORTABLE CHEST 1 VIEW COMPARISON:  01/19/2017 FINDINGS: Endotracheal and NG tubes are stable. Diffuse extensive bilateral airspace disease is stable. There is lucency projecting over the right hilum. Developing pneumothorax in a supine patient is not excluded. Linear atelectasis in left mid lung is stable. Cardiomegaly. IMPRESSION: Stable  cardiomegaly and bilateral airspace disease. Stable support apparatus Possible focal pneumothorax on the right. Attention on follow-up studies is recommended. Electronically Signed   By: Marybelle Killings M.D.   On: 01/20/2017 07:46   Dg Chest Portable 1 View  Result Date: 01/19/2017 CLINICAL DATA:  Retraction of endotracheal tube. EXAM: PORTABLE CHEST 1 VIEW COMPARISON:  Film earlier today at 2046 hours FINDINGS: Endotracheal tube tip now projects approximately 4.5 cm above the carina. Lungs demonstrate some improvement in pulmonary edema since the prior study with moderate edema remaining. Orogastric tube extends below the diaphragm. No significant pleural fluid identified. IMPRESSION: Endotracheal tube tip approximately 4.5 cm above the carina. Decrease in pulmonary edema since the prior study. Electronically Signed   By: Aletta Edouard M.D.   On: 01/19/2017 22:16   Dg Chest Portable 1 View  Result Date: 01/19/2017 CLINICAL DATA:  Respiratory failure and status post intubation. EXAM: PORTABLE CHEST 1 VIEW COMPARISON:  10/27/2016 FINDINGS: Endotracheal tube present with the tip approximately 3.5 cm above the carina. Nasogastric tube extends below the diaphragm. The heart is mildly enlarged. Lungs show diffuse pulmonary edema. No pneumothorax or large pleural effusions. IMPRESSION: Endotracheal tube tip is approximately 3.5 cm above the carina. Lungs demonstrate diffuse pulmonary edema. Electronically Signed   By: Aletta Edouard M.D.   On: 01/19/2017 21:18   Dg Abd Portable 1 View  Result Date: 01/19/2017 CLINICAL DATA:  Orogastric tube placement. EXAM: PORTABLE ABDOMEN - 1 VIEW COMPARISON:  None. FINDINGS: Orogastric tube extends into the stomach with the tip likely located in the body of the stomach. Other visualized bowel gas is unremarkable. IMPRESSION: Orogastric tube extends into the stomach with the tip located in the expected region of the body of the stomach. Electronically Signed   By: Aletta Edouard M.D.   On: 01/19/2017 21:21      Reyne Dumas M.D on 01/24/2017 at 12:01 PM  Between 7am to 7pm - Pager - [phone]  After 7pm go to [email or web address] - password [facility]  [facility] -  Office  [phone]

## 2017-01-24 NOTE — Care Management Important Message (Signed)
Important Message  Patient Details IM Letter given to Kathy/Case Manager to present to Patient  Name: Jamie Mason MRN: [id number] Date of Birth: 06/19/1940   Medicare Important Message Given:  Yes    Caren MacadamFuller, Aesha Agrawal 01/24/2017, 12:03 PMImportant Message  Patient Details  Name: Jamie Mason MRN: [id number] Date of Birth: 01/03/1941   Medicare Important Message Given:  Yes    Caren MacadamFuller, Haywood Meinders 01/24/2017, 12:03 PM

## 2017-01-24 NOTE — Consult Note (Signed)
   [facility] CM Inpatient Consult   01/24/2017  [name] 08/17/1940 [id number]    Spoke with Jamie Mason at bedside. [facility]. [facility] and [facility]. Please see chart review then encounters for further patient outreach details.   Jamie Mason endorses that she plans on returning home with home health services. Encouraged her to be sure she fills out application for medication assistance that [facility] provided.   Also discussed whether or not she has a nebulizer at home. She reports that she does not have a nebulizer at home. Not sure if she will go home on nebulizer meds or not. Will make inpatient RNCM and patient's nurse aware that patient will need order for nebulizer as well as nebulizer medications if she should have it prescribed upon discharge.   Provided contact information to Jamie Mason.   Will update [facility].  Jamie NobleAtika Hall, MSN-Ed, RN,BSN [facility] Hospital Liaison [phone]

## 2017-01-24 NOTE — Care Management Note (Signed)
Case Management Note  Patient Details  Name: Jamie Mason MRN: [id number] Date of Birth: 10/21/1940  Subjective/Objective:  76 y/o f admitted w/Diabetic foot ulcer. From home, has cane,rw. Active w/[facility]-HHRN/HHPT-rep Ad[place] aware & following. PT-[facility] HHPT.Also [facility] active. Noted on nebs-if neb machine needed @ d/c can arrange.                  Action/Plan:d/c plan home w/HHC.   Expected Discharge Date:   (unknown)               Expected Discharge Plan:  Home w Home Health Services  In-House Referral:     Discharge planning Services  CM Consult  Post Acute Care Choice:  Home Health (Active w/[facility]-HHRN/HHPT) Choice offered to:     DME Arranged:    DME Agency:     HH Arranged:    HH Agency:  [facility]  Status of Service:  In process, will continue to follow  If discussed at Long Length of Stay Meetings, dates discussed:    Additional Comments:  Lanier ClamMahabir, Javelle Donigan, RN 01/24/2017, 2:22 PM

## 2017-01-24 NOTE — Progress Notes (Signed)
PT Cancellation Note  Patient Details Name: Jamie Mason MRN: [id number] DOB: 02/24/1941   Cancelled Treatment:    Reason Eval/Treat Not Completed: Medical issues which prohibited therapy. Pt having issues with diarrhea today, check back tomorrow.    [name] 01/24/2017, 3:35 PM

## 2017-01-25 DIAGNOSIS — L97521 Non-pressure chronic ulcer of other part of left foot limited to breakdown of skin: Secondary | ICD-10-CM

## 2017-01-25 DIAGNOSIS — E11621 Type 2 diabetes mellitus with foot ulcer: Principal | ICD-10-CM

## 2017-01-25 LAB — GLUCOSE, CAPILLARY
GLUCOSE-CAPILLARY: 105 mg/dL — AB (ref 65–99)
Glucose-Capillary: 106 mg/dL — ABNORMAL HIGH (ref 65–99)
Glucose-Capillary: 112 mg/dL — ABNORMAL HIGH (ref 65–99)
Glucose-Capillary: 122 mg/dL — ABNORMAL HIGH (ref 65–99)

## 2017-01-25 LAB — CBC
HEMATOCRIT: 26.1 % — AB (ref 36.0–46.0)
HEMOGLOBIN: 8.4 g/dL — AB (ref 12.0–15.0)
MCH: 27.2 pg (ref 26.0–34.0)
MCHC: 32.2 g/dL (ref 30.0–36.0)
MCV: 84.5 fL (ref 78.0–100.0)
Platelets: 339 10*3/uL (ref 150–400)
RBC: 3.09 MIL/uL — AB (ref 3.87–5.11)
RDW: 17.4 % — AB (ref 11.5–15.5)
WBC: 9.4 10*3/uL (ref 4.0–10.5)

## 2017-01-25 LAB — CULTURE, BLOOD (ROUTINE X 2)
Culture: NO GROWTH
Culture: NO GROWTH
SPECIAL REQUESTS: ADEQUATE
Special Requests: ADEQUATE

## 2017-01-25 LAB — BASIC METABOLIC PANEL
ANION GAP: 8 (ref 5–15)
BUN: 24 mg/dL — AB (ref 6–20)
CHLORIDE: 105 mmol/L (ref 101–111)
CO2: 29 mmol/L (ref 22–32)
Calcium: 8.7 mg/dL — ABNORMAL LOW (ref 8.9–10.3)
Creatinine, Ser: 1.35 mg/dL — ABNORMAL HIGH (ref 0.44–1.00)
GFR calc Af Amer: 44 mL/min — ABNORMAL LOW (ref >60)
GFR calc non Af Amer: 38 mL/min — ABNORMAL LOW (ref >60)
Glucose, Bld: 115 mg/dL — ABNORMAL HIGH (ref 65–99)
POTASSIUM: 3.5 mmol/L (ref 3.5–5.1)
SODIUM: 142 mmol/L (ref 135–145)

## 2017-01-25 MED ORDER — FUROSEMIDE 40 MG PO TABS
40.0000 mg | ORAL_TABLET | Freq: Two times a day (BID) | ORAL | Status: DC
  Administered 2017-01-25: 40 mg via ORAL
  Filled 2017-01-25: qty 1

## 2017-01-25 MED ORDER — CEFUROXIME AXETIL 500 MG PO TABS: 500.0000 mg | ORAL_TABLET | Freq: Two times a day (BID) | ORAL | 0 refills | Status: AC

## 2017-01-25 MED ORDER — CEFUROXIME AXETIL 500 MG PO TABS
500.0000 mg | ORAL_TABLET | Freq: Two times a day (BID) | ORAL | Status: DC
  Filled 2017-01-25: qty 1

## 2017-01-25 MED ORDER — METRONIDAZOLE 500 MG PO TABS: 500.0000 mg | ORAL_TABLET | Freq: Three times a day (TID) | ORAL | 0 refills | Status: DC

## 2017-01-25 MED ORDER — SODIUM CHLORIDE 0.9% FLUSH
3.0000 mL | Freq: Two times a day (BID) | INTRAVENOUS | Status: DC
  Administered 2017-01-25: 3 mL via INTRAVENOUS

## 2017-01-25 MED ORDER — DILTIAZEM HCL ER COATED BEADS 240 MG PO CP24: 240.0000 mg | ORAL_CAPSULE | Freq: Every day | ORAL | 0 refills | Status: DC

## 2017-01-25 MED ORDER — METRONIDAZOLE 500 MG PO TABS: 500.0000 mg | ORAL_TABLET | Freq: Three times a day (TID) | ORAL | 0 refills | Status: AC

## 2017-01-25 MED ORDER — CEFUROXIME AXETIL 500 MG PO TABS: 500.0000 mg | ORAL_TABLET | Freq: Two times a day (BID) | ORAL | 0 refills | Status: DC

## 2017-01-25 NOTE — Care Management Note (Signed)
Case Management Note  Patient Details  Name: Jamie Mason MRN: [id number] Date of Birth: 03/26/1941  Subjective/Objective:                    Action/Plan:dC home w/HHC.   Expected Discharge Date:  01/25/17               Expected Discharge Plan:  Home w Home Health Services  In-House Referral:     Discharge planning Services  CM Consult  Post Acute Care Choice:  Home Health (Active w/[facility]-HHRN/HHPT) Choice offered to:     DME Arranged:    DME Agency:  [facility]  HH [facility]  [facility]  Status of Service:  Completed, signed off  If discussed at Long Length of Stay Meetings, dates discussed:    Additional Comments:  Lanier ClamMahabir, Jamie Agostino, RN 01/25/2017, 12:33 PM

## 2017-01-25 NOTE — Discharge Summary (Addendum)
Physician Discharge Summary  Jamie Mason ZOX:[id number] DOB: 1940-12-02 DOA: 01/19/2017  PCP: Burns Spain, MD  Admit date: 01/19/2017 Discharge date: 01/25/2017  Admitted From: Home Disposition:  Home  Recommendations for Outpatient Follow-up:  1. Follow up with PCP in 1 week 2. Please obtain BMP/CBC in 1 week  3. Please follow up on the following pending results: final blood culture results  4. Patient will need sleep study as an outpatient as she likely has OSA 5. Recommend vascular doppler of lower extremities to evaluate for blood flow  6. Ha1c as outpatient   Home Health: PT RN   Equipment/Devices: None   Discharge Condition: Stable CODE STATUS: Full  Diet recommendation: Heart healthy, carb modified   Brief/Interim Summary: H&P from Dr. Luberta Robertson: Jamie Mason is an 76 y.o. female  with past medical history significant for hypertension, diabetes type 2, hyperlipidemia, coronary artery disease, atrial fibrillation and flutter and chronic lower extremity edema who who comes in for a lower extremity wound. Patient states she was in her usual state of health and feels at baseline but came in today at the urging of her home nurse. Patient states that her nurse said she has a wound that is covered with maggots that needs to get taken care of. Patient herself has no acute complaints. She does not have any pain in the leg. She denies even knowing that the wound was there until the nurse told her that it was.  ED Course:  Patient was given 1 dose of Vanco and ciprofloxacin and admitted for further treatment. General surgery was called and referred patient to orthopedic surgery. Orthopedic surgery saw patient and felt that given negative x-ray and no bony involvement this should be managed by general surgery. General surgery will come see the patient tomorrow.  Interim: General Surgery evaluated patient; the wound was superficial without cellulitis, necrotic tissue. She does not  require any debridement. Gen. surgery recommended local wound care. Wound nurse was also consulted during admission. Hospitalization has been complicated by respiratory failure due to pulmonary edema. She failed bipap and was subsequently intubated. She improved and was extubated after diuresis.  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)   Chronic venous insufficiency   Atrial flutter (HCC)   Respiratory failure with hypoxia (HCC)   Diabetic foot ulcer, wound culture showing staph aureus pansensitive - General surgery consult appreciated, no need for debridement, continue with local wound care - No evidence of osteomyelitis on plain films - Transition to PO antibiotics on discharge  - Recommend vascular doppler of lower extremities to evaluate for blood flow   Acute hypoxic respiratory failure - This is in the setting acute on chronic diastolic CHF, required intubation, currently successfully extubated after appropriate diuresis. Currently on room air this morning - Transition to home PO lasix on discharge  - Patient will need sleep study as an outpatient as she likely has OSA per PCCM.  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 Continue with insulin sliding scale  - Need close follow up with PCP, repeat Ha1c as  outpatient   HTN - Blood pressure controlled, Continue hydralazine, diltiazem, Lasix, carvedilol   CAD - Continue carvedilol and atorvastatin and aspirin   Atrial fibrillation - Continue amiodarone, diltiazem, carvedilol for heart rate control  -  Eliquis  Hyperlipidemia - Continue Atorvastatin   Morbid obesity - Body mass index is 49.48 kg/m.   Discharge Instructions  Discharge Instructions    Call MD for:  difficulty breathing, headache or visual disturbances    Complete by:  As directed    Call MD for:  extreme fatigue    Complete by:  As directed    Call MD for:  hives    Complete by:  As directed    Call MD for:  persistant dizziness or light-headedness    Complete by:  As directed    Call MD for:  persistant nausea and vomiting    Complete by:  As directed    Call MD for:  severe uncontrolled pain    Complete by:  As directed    Call MD for:  temperature >100.4    Complete by:  As directed    Diet - low sodium heart healthy    Complete by:  As directed    Discharge instructions    Complete by:  As directed    You were cared for by a hospitalist during your hospital stay. If you have any questions about your discharge medications or the care you received while you were in the hospital after you are discharged, you can call the unit and asked to speak with the hospitalist on call if the hospitalist that took care of you is not available. Once you are discharged, your primary care physician will handle any further medical issues. Please note that NO REFILLS for any discharge medications will be authorized once you are discharged, as it is imperative that you return to your primary care physician (or establish a relationship with a primary care physician if you do not have one) for your aftercare needs so that they can reassess your need for medications and monitor your lab values.   Discharge wound care:    Complete by:  As directed    Clean with soap and water and dry non-stick dressing.  Local skin care to the woody skin around the ulcer and on both lower legs   Increase activity slowly    Complete by:  As directed      Allergies as of 01/25/2017      Reactions   Lisinopril Swelling   Tongue swelling    Ace  Inhibitors Other (See Comments)   unknown   Codeine Itching, Rash   Penicillins Itching, Rash   Has patient had a PCN reaction causing immediate rash, facial/tongue/throat swelling, SOB or lightheadedness with hypotension: unknown Has patient had a PCN reaction causing severe rash involving mucus membranes or skin necrosis: unknown Has patient had a PCN reaction that required hospitalization: no Has patient had a PCN reaction occurring within the last 10 years: no If all of the above answers are "NO", then may proceed with Cephalosporin use.   Tomato    Itchy rash      Medication List    STOP taking these medications   gabapentin 100 MG capsule Commonly known as:  NEURONTIN     TAKE these medications   albuterol 108 (90 Base) MCG/ACT inhaler Commonly known as:  PROVENTIL HFA;VENTOLIN HFA Inhale 2 puffs into the lungs every 6 (six) hours as needed for wheezing or shortness  of breath.   amiodarone 200 MG tablet Commonly known as:  PACERONE Take 1 tablet (200 mg total) by mouth daily.   apixaban 5 MG Tabs tablet Commonly known as:  ELIQUIS Take 1 tablet (5 mg total) by mouth 2 (two) times daily.   ASPIR-LOW 81 MG EC tablet Generic drug:  aspirin Take 81 mg by mouth daily. Swallow whole.   atorvastatin 40 MG tablet Commonly known as:  LIPITOR Take 1 tablet (40 mg total) by mouth daily.   carvedilol 25 MG tablet Commonly known as:  COREG Take 1 tablet (25 mg total) by mouth 2 (two) times daily with a meal.   cefUROXime 500 MG tablet Commonly known as:  CEFTIN Take 1 tablet (500 mg total) by mouth 2 (two) times daily with a meal.   diltiazem 240 MG 24 hr capsule Commonly known as:  CARTIA XT Take 1 capsule (240 mg total) by mouth daily. What changed:  medication strength  how much to take   furosemide 40 MG tablet Commonly known as:  LASIX Take 1 tablet (40 mg total) by mouth 2 (two) times daily. Take 2 tablets by mouth every morning and 1 tablet by mouth every  afternoon What changed:  additional instructions   hydrALAZINE 50 MG tablet Commonly known as:  APRESOLINE Take 1 tablet (50 mg total) by mouth 3 (three) times daily.   hydrocerin Crea Apply to bilateral LEs after cleansing on Mondays and Thursdays (prior to [employer] application).   metFORMIN 500 MG tablet Commonly known as:  GLUCOPHAGE Take 1 tablet (500 mg total) by mouth 2 (two) times daily with a meal.   metroNIDAZOLE 500 MG tablet Commonly known as:  FLAGYL Take 1 tablet (500 mg total) by mouth every 8 (eight) hours.   polyethylene glycol powder powder Commonly known as:  GLYCOLAX/MIRALAX MIX 1 CAPFUL (17GM) IN LIQUID AND DRINK EVERY DAY      Follow-up Information    [facility] Follow up.   Specialty:  Home Health Services Why:  [facility] nursing/physical therapy Contact information: [address] [place] [address] [phone]          Allergies  Allergen Reactions  . Lisinopril Swelling    Tongue swelling   . Ace Inhibitors Other (See Comments)    unknown  . Codeine Itching and Rash  . Penicillins Itching and Rash    Has patient had a PCN reaction causing immediate rash, facial/tongue/throat swelling, SOB or lightheadedness with hypotension: unknown Has patient had a PCN reaction causing severe rash involving mucus membranes or skin necrosis: unknown Has patient had a PCN reaction that required hospitalization: no Has patient had a PCN reaction occurring within the last 10 years: no If all of the above answers are "NO", then may proceed with Cephalosporin use.  . Tomato     Itchy rash    Consultations:  PCCM  General surgery    Procedures/Studies: Dg Tibia/fibula Left  Result Date: 01/19/2017 CLINICAL DATA:  Nonhealing wound of distal left lower leg. EXAM: LEFT TIBIA AND FIBULA - 2 VIEW COMPARISON:  06/26/2016 FINDINGS: There is no evidence of acute fracture, subluxation or dislocation. No radiographic evidence  of osteomyelitis noted. Degenerative changes in the knee noted. Calcaneal spur is again noted. No radiopaque foreign bodies are identified. IMPRESSION: No evidence of acute bony abnormality. Electronically Signed   By: Harmon Pier M.D.   On: 01/19/2017 15:40   Dg Chest [facility]  Result Date: 01/24/2017 CLINICAL DATA:  76 year old female with shortness of breath. Subsequent encounter. EXAM: PORTABLE CHEST 1 VIEW COMPARISON:  01/22/2017. FINDINGS: Cardiomegaly. Pulmonary vascular congestion greatest centrally slightly improved from prior exam. Limited for evaluating for mediastinal mass given the central pulmonary vascular prominence. No pneumothorax or segmental consolidation. Bilateral acromioclavicular joint degenerative changes. Aortic calcifications. IMPRESSION: Cardiomegaly. Pulmonary vascular congestion greatest centrally slightly improved from the prior exam. Aortic Atherosclerosis (ICD10-I70.0). Electronically Signed   By: Lacy Duverney M.D.   On: 01/24/2017 13:58   Dg Chest Port 1 View  Result Date: 01/22/2017 CLINICAL DATA:  Shortness of breath today. EXAM: PORTABLE CHEST 1 VIEW COMPARISON:  01/21/2017. FINDINGS: Stable enlarged cardiac silhouette. Mild increase in prominence of the interstitial markings. No pleural fluid. The right hilum appears less prominent today. Unremarkable bones. IMPRESSION: Stable cardiomegaly with interval probable mild interstitial pulmonary edema. Electronically Signed   By: Beckie Salts M.D.   On: 01/22/2017 10:53   Dg Chest Port 1 View  Result Date: 01/21/2017 CLINICAL DATA:  Pulmonary edema. EXAM: PORTABLE CHEST 1 VIEW COMPARISON:  01/20/2017, 07/06/2016 and 03/19/2014 FINDINGS: Patient slightly rotated left. Lungs are adequately inflated demonstrate stable hazy prominence of the central pulmonary vessels. No lobar consolidation, effusion or pneumothorax. Prominent right hilum unchanged from recent exams although worse compared to 03/19/2014. Mild stable  cardiomegaly. Calcified plaque over the aortic arch. Degenerative change of the spine. IMPRESSION: Stable hazy prominence of the central pulmonary vessels suggesting mild vascular congestion. Mild stable cardiomegaly. Prominence of the right hilum. Consider PA and lateral chest radiograph when feasible for better evaluation versus contrast-enhanced chest CT for further evaluation. Aortic Atherosclerosis (ICD10-I70.0). Electronically Signed   By: Elberta Fortis M.D.   On: 01/21/2017 07:16   Dg Chest Port 1 View  Result Date: 01/20/2017 CLINICAL DATA:  Shortness of breath.  Pulmonary edema. EXAM: PORTABLE CHEST 1 VIEW COMPARISON:  Multiple prior chest x-rays, most recently earlier today. FINDINGS: Interval removal of the endotracheal and enteric tubes. Cardiomegaly, unchanged. Right greater than left perihilar opacities, similar to prior study. Bibasilar atelectasis. No definite pleural effusion. No acute osseous abnormality. IMPRESSION: New 1. Stable cardiomegaly and bilateral predominantly perihilar densities, likely edema. 2. No definite pneumothorax. Electronically Signed   By: Obie Dredge M.D.   On: 01/20/2017 12:33   Dg Chest Port 1 View  Result Date: 01/20/2017 CLINICAL DATA:  Respiratory failure EXAM: PORTABLE CHEST 1 VIEW COMPARISON:  01/19/2017 FINDINGS: Endotracheal and NG tubes are stable. Diffuse extensive bilateral airspace disease is stable. There is lucency projecting over the right hilum. Developing pneumothorax in a supine patient is not excluded. Linear atelectasis in left mid lung is stable. Cardiomegaly. IMPRESSION: Stable cardiomegaly and bilateral airspace disease. Stable support apparatus Possible focal pneumothorax on the right. Attention on follow-up studies is recommended. Electronically Signed   By: Jolaine Click M.D.   On: 01/20/2017 07:46   Dg Chest Portable 1 View  Result Date: 01/19/2017 CLINICAL DATA:  Retraction of endotracheal tube. EXAM: PORTABLE CHEST 1 VIEW  COMPARISON:  Film earlier today at 2046 hours FINDINGS: Endotracheal tube tip now projects approximately 4.5 cm above the carina. Lungs demonstrate some improvement in pulmonary edema since the prior study with moderate edema remaining. Orogastric tube extends below the diaphragm. No significant pleural fluid identified. IMPRESSION: Endotracheal tube tip approximately 4.5 cm above the carina. Decrease in pulmonary edema since the prior study. Electronically Signed   By: Irish Lack M.D.   On: 01/19/2017 22:16   Dg Chest Portable  1 View  Result Date: 01/19/2017 CLINICAL DATA:  Respiratory failure and status post intubation. EXAM: PORTABLE CHEST 1 VIEW COMPARISON:  10/27/2016 FINDINGS: Endotracheal tube present with the tip approximately 3.5 cm above the carina. Nasogastric tube extends below the diaphragm. The heart is mildly enlarged. Lungs show diffuse pulmonary edema. No pneumothorax or large pleural effusions. IMPRESSION: Endotracheal tube tip is approximately 3.5 cm above the carina. Lungs demonstrate diffuse pulmonary edema. Electronically Signed   By: Irish Lack M.D.   On: 01/19/2017 21:18   Dg Abd Portable 1 View  Result Date: 01/19/2017 CLINICAL DATA:  Orogastric tube placement. EXAM: PORTABLE ABDOMEN - 1 VIEW COMPARISON:  None. FINDINGS: Orogastric tube extends into the stomach with the tip likely located in the body of the stomach. Other visualized bowel gas is unremarkable. IMPRESSION: Orogastric tube extends into the stomach with the tip located in the expected region of the body of the stomach. Electronically Signed   By: Irish Lack M.D.   On: 01/19/2017 21:21      Discharge Exam: Vitals:   01/25/17 0636 01/25/17 0737  BP: (!) 147/67   Pulse:    Resp:    Temp:    SpO2:  95%   Vitals:   01/24/17 2120 01/25/17 0445 01/25/17 0636 01/25/17 0737  BP: 130/61 118/66 (!) 147/67   Pulse: 92 95    Resp: 18 20    Temp: 98.7 F (37.1 C) 99 F (37.2 C)    TempSrc: Oral Oral     SpO2: 96% 96%  95%  Weight:  (!) 147.6 kg (325 lb 6.4 oz)    Height:        General: Pt is alert, awake, not in acute distress Cardiovascular: S1/S2 +, no rubs, no gallops Respiratory: CTA bilaterally, no wheezing, no rhonchi. On room air without distress  Abdominal: Soft, NT, ND, bowel sounds + Extremities: no edema, no cyanosis Skin: left shin with small superficial wound, about 1-2 cm without gross pus or drainage     The results of significant diagnostics from this hospitalization (including imaging, microbiology, ancillary and laboratory) are listed below for reference.     Microbiology: Recent Results (from the past 240 hour(s))  Culture, blood (Routine X 2) w Reflex to ID Panel     Status: None   Collection Time: 01/19/17 10:45 PM  Result Value Ref Range Status   Specimen Description BLOOD BLOOD RIGHT HAND  Final   Special Requests   Final    BOTTLES DRAWN AEROBIC AND ANAEROBIC Blood Culture adequate volume   Culture   Final    NO GROWTH 5 DAYS Performed at [facility], [address] [place], [place] [address]    Report Status 01/25/2017 FINAL  Final  Culture, blood (Routine X 2) w Reflex to ID Panel     Status: None   Collection Time: 01/19/17 10:47 PM  Result Value Ref Range Status   Specimen Description BLOOD RIGHT ANTECUBITAL  Final   Special Requests IN PEDIATRIC BOTTLE Blood Culture adequate volume  Final   Culture   Final    NO GROWTH 5 DAYS Performed at [facility], [address], [place], [place] [address]    Report Status 01/25/2017 FINAL  Final  Aerobic Culture (superficial specimen)     Status: None   Collection Time: 01/19/17 11:12 PM  Result Value Ref Range Status   Specimen Description WOUND LEG LEFT  Final   Special Requests NONE  Final  Gram Stain   Final    ABUNDANT WBC PRESENT,BOTH PMN AND MONONUCLEAR ABUNDANT GRAM POSITIVE COCCI IN PAIRS IN CLUSTERS MODERATE GRAM VARIABLE ROD    Culture   Final    RARE STAPHYLOCOCCUS  AUREUS WITH MIXED BACTERIAL ORGANISMS    Report Status 01/23/2017 FINAL  Final   Organism ID, Bacteria STAPHYLOCOCCUS AUREUS  Final      Susceptibility   Staphylococcus aureus - MIC*    CIPROFLOXACIN <=0.5 SENSITIVE Sensitive     ERYTHROMYCIN <=0.25 SENSITIVE Sensitive     GENTAMICIN <=0.5 SENSITIVE Sensitive     OXACILLIN 0.5 SENSITIVE Sensitive     TETRACYCLINE <=1 SENSITIVE Sensitive     VANCOMYCIN <=0.5 SENSITIVE Sensitive     TRIMETH/SULFA <=10 SENSITIVE Sensitive     CLINDAMYCIN <=0.25 SENSITIVE Sensitive     RIFAMPIN <=0.5 SENSITIVE Sensitive     Inducible Clindamycin NEGATIVE Sensitive     * RARE STAPHYLOCOCCUS AUREUS  MRSA PCR Screening     Status: Abnormal   Collection Time: 01/20/17 12:44 AM  Result Value Ref Range Status   MRSA by PCR INVALID RESULTS, SPECIMEN SENT FOR CULTURE (A) NEGATIVE Final    Comment:        The GeneXpert MRSA Assay (FDA approved for NASAL specimens only), is one component of a comprehensive MRSA colonization surveillance program. It is not intended to diagnose MRSA infection nor to guide or monitor treatment for MRSA infections.   MRSA culture     Status: None   Collection Time: 01/20/17 12:44 AM  Result Value Ref Range Status   Specimen Description NOSE  Final   Special Requests NONE  Final   Culture   Final    NO MRSA DETECTED Performed at [facility], [address] [place], [place] [address]    Report Status 01/21/2017 FINAL  Final  Culture, respiratory (NON-Expectorated)     Status: None   Collection Time: 01/20/17  5:25 AM  Result Value Ref Range Status   Specimen Description TRACHEAL ASPIRATE  Final   Special Requests NONE  Final   Gram Stain   Final    RARE WBC PRESENT,BOTH PMN AND MONONUCLEAR NO ORGANISMS SEEN    Culture   Final    Consistent with normal respiratory flora. Performed at [facility], [address] [place], [place] [address]    Report Status 01/22/2017 FINAL  Final     Labs: BNP  (last 3 results)  Recent Labs  10/27/16 1137 01/19/17 1535 01/21/17 0328  BNP 500.7* 427.4* 812.0*   Basic Metabolic Panel:  Recent Labs Lab 01/19/17 2246 01/20/17 0717 01/21/17 0328 01/22/17 0452 01/23/17 0354 01/24/17 0511 01/25/17 0433  NA  --  144 141 141 142 144 142  K  --  4.0 4.6 4.1 3.7 3.8 3.5  CL  --  106 104 106 106 109 105  CO2  --  26 24 23 27 28 29   GLUCOSE  --  170* 132* 129* 102* 128* 115*  BUN  --  30* 40* 51* 40* 31* 24*  CREATININE  --  1.71* 1.99* 2.07* 1.63* 1.43* 1.35*  CALCIUM  --  8.9 8.7* 8.4* 8.8* 8.7* 8.7*  MG 2.0 2.0  --   --   --   --   --   PHOS  --  4.7*  --   --   --   --   --    Liver Function Tests:  Recent Labs Lab 01/24/17 0511  AST 29  ALT 27  ALKPHOS 69  BILITOT 0.5  PROT 7.0  ALBUMIN 3.1*   No results for input(s): LIPASE, AMYLASE in the last 168 hours. No results for input(s): AMMONIA in the last 168 hours. CBC:  Recent Labs Lab 01/19/17 1535 01/20/17 0330 01/21/17 0328 01/23/17 0354 01/25/17 0433  WBC 9.6 15.0* 20.0* 11.7* 9.4  NEUTROABS 7.4  --   --   --   --   HGB 8.2* 9.2* 8.0* 8.8* 8.4*  HCT 24.7* 28.7* 24.0* 27.1* 26.1*  MCV 86.1 85.4 85.7 86.3 84.5  PLT 332 332 331 366 339   Cardiac Enzymes:  Recent Labs Lab 01/19/17 2246 01/20/17 0330 01/20/17 0854  TROPONINI <0.03 <0.03 <0.03   BNP: Invalid input(s): POCBNP CBG:  Recent Labs Lab 01/24/17 2008 01/25/17 0022 01/25/17 0438 01/25/17 0738 01/25/17 1144  GLUCAP 142* 122* 112* 106* 105*   D-Dimer No results for input(s): DDIMER in the last 72 hours. Hgb A1c No results for input(s): HGBA1C in the last 72 hours. Lipid Profile No results for input(s): CHOL, HDL, LDLCALC, TRIG, CHOLHDL, LDLDIRECT in the last 72 hours. Thyroid function studies No results for input(s): TSH, T4TOTAL, T3FREE, THYROIDAB in the last 72 hours.  Invalid input(s): FREET3 Anemia work up No results for input(s): VITAMINB12, FOLATE, FERRITIN, TIBC, IRON, RETICCTPCT  in the last 72 hours. Urinalysis    Component Value Date/Time   COLORURINE YELLOW 01/19/2017 2306   APPEARANCEUR CLOUDY (A) 01/19/2017 2306   LABSPEC 1.012 01/19/2017 2306   PHURINE 5.0 01/19/2017 2306   GLUCOSEU NEGATIVE 01/19/2017 2306   HGBUR LARGE (A) 01/19/2017 2306   BILIRUBINUR NEGATIVE 01/19/2017 2306   BILIRUBINUR Negative 03/19/2014 1631   KETONESUR NEGATIVE 01/19/2017 2306   PROTEINUR 30 (A) 01/19/2017 2306   UROBILINOGEN 0.2 03/19/2014 1631   NITRITE NEGATIVE 01/19/2017 2306   LEUKOCYTESUR TRACE (A) 01/19/2017 2306   Sepsis Labs Invalid input(s): PROCALCITONIN,  WBC,  LACTICIDVEN Microbiology Recent Results (from the past 240 hour(s))  Culture, blood (Routine X 2) w Reflex to ID Panel     Status: None   Collection Time: 01/19/17 10:45 PM  Result Value Ref Range Status   Specimen Description BLOOD BLOOD RIGHT HAND  Final   Special Requests   Final    BOTTLES DRAWN AEROBIC AND ANAEROBIC Blood Culture adequate volume   Culture   Final    NO GROWTH 5 DAYS Performed at [facility], [address] [place], [place] [address]    Report Status 01/25/2017 FINAL  Final  Culture, blood (Routine X 2) w Reflex to ID Panel     Status: None   Collection Time: 01/19/17 10:47 PM  Result Value Ref Range Status   Specimen Description BLOOD RIGHT ANTECUBITAL  Final   Special Requests IN PEDIATRIC BOTTLE Blood Culture adequate volume  Final   Culture   Final    NO GROWTH 5 DAYS Performed at [facility], [address] [place], [place] [address]    Report Status 01/25/2017 FINAL  Final  Aerobic Culture (superficial specimen)     Status: None   Collection Time: 01/19/17 11:12 PM  Result Value Ref Range Status   Specimen Description WOUND LEG LEFT  Final   Special Requests NONE  Final   Gram Stain   Final    ABUNDANT WBC PRESENT,BOTH PMN AND MONONUCLEAR ABUNDANT GRAM POSITIVE COCCI IN PAIRS IN CLUSTERS MODERATE GRAM VARIABLE ROD    Culture   Final  RARE  STAPHYLOCOCCUS AUREUS WITH MIXED BACTERIAL ORGANISMS    Report Status 01/23/2017 FINAL  Final   Organism ID, Bacteria STAPHYLOCOCCUS AUREUS  Final      Susceptibility   Staphylococcus aureus - MIC*    CIPROFLOXACIN <=0.5 SENSITIVE Sensitive     ERYTHROMYCIN <=0.25 SENSITIVE Sensitive     GENTAMICIN <=0.5 SENSITIVE Sensitive     OXACILLIN 0.5 SENSITIVE Sensitive     TETRACYCLINE <=1 SENSITIVE Sensitive     VANCOMYCIN <=0.5 SENSITIVE Sensitive     TRIMETH/SULFA <=10 SENSITIVE Sensitive     CLINDAMYCIN <=0.25 SENSITIVE Sensitive     RIFAMPIN <=0.5 SENSITIVE Sensitive     Inducible Clindamycin NEGATIVE Sensitive     * RARE STAPHYLOCOCCUS AUREUS  MRSA PCR Screening     Status: Abnormal   Collection Time: 01/20/17 12:44 AM  Result Value Ref Range Status   MRSA by PCR INVALID RESULTS, SPECIMEN SENT FOR CULTURE (A) NEGATIVE Final    Comment:        The GeneXpert MRSA Assay (FDA approved for NASAL specimens only), is one component of a comprehensive MRSA colonization surveillance program. It is not intended to diagnose MRSA infection nor to guide or monitor treatment for MRSA infections.   MRSA culture     Status: None   Collection Time: 01/20/17 12:44 AM  Result Value Ref Range Status   Specimen Description NOSE  Final   Special Requests NONE  Final   Culture   Final    NO MRSA DETECTED Performed at [facility], [address] [place], [place] [address]    Report Status 01/21/2017 FINAL  Final  Culture, respiratory (NON-Expectorated)     Status: None   Collection Time: 01/20/17  5:25 AM  Result Value Ref Range Status   Specimen Description TRACHEAL ASPIRATE  Final   Special Requests NONE  Final   Gram Stain   Final    RARE WBC PRESENT,BOTH PMN AND MONONUCLEAR NO ORGANISMS SEEN    Culture   Final    Consistent with normal respiratory flora. Performed at [facility], [address] [place], [place] [address]    Report Status 01/22/2017 FINAL  Final      Time coordinating discharge: 40 minutes  SIGNED:  Noralee Stain, DO [facility] Pager [phone]  If 7PM-7AM, please contact night-coverage [email or web address] Password [email or web address] 01/25/2017, 12:01 PM

## 2017-01-25 NOTE — Evaluation (Signed)
Occupational Therapy Evaluation Patient Details Name: Jamie Mason MRN: [id number] DOB: August 17, 1940 Today's Date: 01/25/2017    History of Present Illness Pt admitted through ED with L calf wound containing maggots and SOB which progressed to resp distress requiring short-term intubation.  Pt with hx of MI, DM, CAD,and a-fib.   Clinical Impression   Pt admitted with L LE wound. Pt currently with functional limitations due to the deficits listed below (see OT Problem List). Pt will benefit from skilled OT to increase their safety and independence with ADL and functional mobility for ADL to facilitate discharge to venue listed below.      Follow Up Recommendations  Home health OT;Supervision/Assistance - 24 hour    Equipment Recommendations  3 in 1 bedside commode       Precautions / Restrictions Precautions Precautions: Fall Restrictions Weight Bearing Restrictions: No      Mobility Bed Mobility               General bed mobility comments: pt in chair  Transfers Overall transfer level: Needs assistance Equipment used: Rolling walker (2 wheeled) Transfers: Sit to/from [employer] Sit to Stand: Min assist Stand pivot transfers: Min assist       General transfer comment: cues for transition position and use of UEs to self assist    Balance Overall balance assessment: Needs assistance Sitting-balance support: Bilateral upper extremity supported;Feet supported Sitting balance-Leahy Scale: Good     Standing balance support: Bilateral upper extremity supported Standing balance-Leahy Scale: Fair                             ADL either performed or assessed with clinical judgement   ADL Overall ADL's : Needs assistance/impaired Eating/Feeding: Set up;Sitting   Grooming: Set up;Sitting   Upper Body Bathing: Set up;Sitting   Lower Body Bathing: Moderate assistance;Cueing for safety;Cueing for sequencing;Sit to/from stand   Upper  Body Dressing : Set up;Sitting   Lower Body Dressing: Moderate assistance;Sit to/from stand;Cueing for safety;Cueing for sequencing   Toilet Transfer: Min guard;Cueing for safety;BSC   Toileting- Clothing Manipulation and Hygiene: Minimal assistance;Sit to/from stand;Cueing for sequencing     Tub/Shower Transfer Details (indicate cue type and reason): states she will take a sponge bath Functional mobility during ADLs: Minimal assistance;Rolling walker       Vision Patient Visual Report: No change from baseline              Pertinent Vitals/Pain Pain Assessment: No/denies pain     Hand Dominance Right   Extremity/Trunk Assessment         Cervical / Trunk Assessment Cervical / Trunk Assessment: Kyphotic   Communication Communication Communication: No difficulties   Cognition Arousal/Alertness: Awake/alert Behavior During Therapy: WFL for tasks assessed/performed Overall Cognitive Status: Within Functional Limits for tasks assessed                                                Home Living Family/patient expects to be discharged to:: Private residence Living Arrangements: Spouse/significant other Available Help at Discharge: Family;Available 24 hours/day Type of Home: House Home Access: Stairs to enter [employer] of Steps: 3 Entrance Stairs-Rails: Right Home Layout: One level     Bathroom Shower/Tub: Tub/shower unit         Home Equipment: Environmental consultant -  2 wheels;Cane - quad   Additional Comments: husband is blind, 1 dtr works, other dtr stays at home during the day      Prior Functioning/Environment Level of Independence: Independent with assistive device(s)        Comments: walks with cane, sleeps in recliner, sponge bathes        OT Problem List: Decreased strength;Impaired balance (sitting and/or standing);Decreased knowledge of use of DME or AE      OT Treatment/Interventions: Self-care/ADL training;Patient/family  education;DME and/or AE instruction    OT Goals(Current goals can be found in the care plan section) Acute Rehab OT Goals Patient Stated Goal: Regain strength and return home OT Goal Formulation: With patient Time For Goal Achievement: 02/08/17  OT Frequency: Min 2X/week              AM-PAC PT "6 Clicks" Daily Activity     Outcome Measure Help from another person eating meals?: None Help from another person taking care of personal grooming?: None Help from another person toileting, which includes using toliet, bedpan, or urinal?: A Little Help from another person bathing (including washing, rinsing, drying)?: A Little Help from another person to put on and taking off regular upper body clothing?: A Little Help from another person to put on and taking off regular lower body clothing?: A Lot 6 Click Score: 19   End of Session Equipment Utilized During Treatment: Rolling walker Nurse Communication: Mobility status  Activity Tolerance: Patient tolerated treatment well Patient left: in chair  OT Visit Diagnosis: Unsteadiness on feet (R26.81)                Time: [id number] OT Time Calculation (min): 24 min Charges:  OT General Charges $OT Visit: 1 Procedure OT Evaluation $OT Eval Moderate Complexity: 1 Procedure OT Treatments $Self Care/Home Management : 8-22 mins G-Codes:     Lise AuerLori Daimon Kean, OT [phone]  Einar CrowEDDING, Cebastian Neis D 01/25/2017, 12:42 PM

## 2017-01-26 DIAGNOSIS — I130 Hypertensive heart and chronic kidney disease with heart failure and stage 1 through stage 4 chronic kidney disease, or unspecified chronic kidney disease: Secondary | ICD-10-CM | POA: Diagnosis not present

## 2017-01-26 DIAGNOSIS — I5032 Chronic diastolic (congestive) heart failure: Secondary | ICD-10-CM | POA: Diagnosis not present

## 2017-01-26 DIAGNOSIS — Z87891 Personal history of nicotine dependence: Secondary | ICD-10-CM | POA: Diagnosis not present

## 2017-01-26 DIAGNOSIS — I2510 Atherosclerotic heart disease of native coronary artery without angina pectoris: Secondary | ICD-10-CM | POA: Diagnosis not present

## 2017-01-26 DIAGNOSIS — N182 Chronic kidney disease, stage 2 (mild): Secondary | ICD-10-CM | POA: Diagnosis not present

## 2017-01-26 DIAGNOSIS — M6281 Muscle weakness (generalized): Secondary | ICD-10-CM | POA: Diagnosis not present

## 2017-01-26 DIAGNOSIS — I4892 Unspecified atrial flutter: Secondary | ICD-10-CM | POA: Diagnosis not present

## 2017-01-26 DIAGNOSIS — Z7982 Long term (current) use of aspirin: Secondary | ICD-10-CM | POA: Diagnosis not present

## 2017-01-26 DIAGNOSIS — E1122 Type 2 diabetes mellitus with diabetic chronic kidney disease: Secondary | ICD-10-CM | POA: Diagnosis not present

## 2017-01-27 ENCOUNTER — Other Ambulatory Visit: Payer: Self-pay | Admitting: *Deleted

## 2017-01-27 ENCOUNTER — Ambulatory Visit: Payer: Self-pay | Admitting: Pharmacist

## 2017-01-27 DIAGNOSIS — Z87891 Personal history of nicotine dependence: Secondary | ICD-10-CM | POA: Diagnosis not present

## 2017-01-27 DIAGNOSIS — E1122 Type 2 diabetes mellitus with diabetic chronic kidney disease: Secondary | ICD-10-CM | POA: Diagnosis not present

## 2017-01-27 DIAGNOSIS — N182 Chronic kidney disease, stage 2 (mild): Secondary | ICD-10-CM | POA: Diagnosis not present

## 2017-01-27 DIAGNOSIS — I2510 Atherosclerotic heart disease of native coronary artery without angina pectoris: Secondary | ICD-10-CM | POA: Diagnosis not present

## 2017-01-27 DIAGNOSIS — M6281 Muscle weakness (generalized): Secondary | ICD-10-CM | POA: Diagnosis not present

## 2017-01-27 DIAGNOSIS — I4892 Unspecified atrial flutter: Secondary | ICD-10-CM | POA: Diagnosis not present

## 2017-01-27 DIAGNOSIS — I130 Hypertensive heart and chronic kidney disease with heart failure and stage 1 through stage 4 chronic kidney disease, or unspecified chronic kidney disease: Secondary | ICD-10-CM | POA: Diagnosis not present

## 2017-01-27 DIAGNOSIS — Z7982 Long term (current) use of aspirin: Secondary | ICD-10-CM | POA: Diagnosis not present

## 2017-01-27 DIAGNOSIS — I5032 Chronic diastolic (congestive) heart failure: Secondary | ICD-10-CM | POA: Diagnosis not present

## 2017-01-27 NOTE — Patient Outreach (Signed)
[facility]) Care Management  01/27/2017  Jamie Mason 23-Oct-1940 [id number]   Member discharged from hospital on Wednesday, 8/15.  Call placed for transition of care, husband report she is unavailable at this time.  Request made for member to call back, if no call back, will follow up next week.  [name], [place], MSN [facility] Care Management  [facility] Manager [phone]

## 2017-01-30 ENCOUNTER — Telehealth: Payer: Self-pay | Admitting: Internal Medicine

## 2017-01-30 ENCOUNTER — Other Ambulatory Visit: Payer: Self-pay | Admitting: *Deleted

## 2017-01-30 NOTE — Patient Outreach (Signed)
[facility]) Care Management  01/30/2017  Jamie Mason [birth year] [id number]   Call placed to member for transition of care as she was recently discharged.  She report that she is doing well, state she has been changing her leg dressing independently every day and home health is set to visit weekly, last visit was Friday.  She report she has all supplies needed for daily changes.  Advised of importance of keeping wound clean to decrease risk of increasing infection.  She state she will continue with PT as well.  Patient was recently discharged from hospital and all medications have been reviewed.    She state she has been compliant with medications, however medication list does not match her discharge instructions.  Instructions has 2 antibiotics listed, but member denies receiving them.  This care manager placed call to local pharmacy to inquire about any pending prescriptions, however they don't have any available.  Member then placed call to [facility] mail order pharmacy, they state they have the prescriptions but have not filled it yet due to a listed allergy to Penicillin (Ceftin ordered).    Member report she has continued to weigh self most days, today's weight 324 pounds.  She admit that she continue to struggle with diet, re-educated on low sodium diet and fluid restrictions to decrease risk of exacerbation.  She denies any shortness of breath at this time.    Will contact primary MD office to discuss member not having antibiotic therapy since discharge.    Update 8/21:  Spoke with H. Alfredo Bach at primary MD office, informed her that member has not taken antibiotics since discharge due to order going to mail order instead of local pharmacy.  Member has follow up on 8/23, she will make MD aware.  Call then placed to member to update her that MD is aware and will follow up during office visit.  This care manager will follow up with member next week for weekly transition of  care call.  [name], [place], MSN [facility] Care Management  [facility] Manager [phone]

## 2017-01-30 NOTE — Telephone Encounter (Signed)
[facility] calling for VO.

## 2017-01-31 ENCOUNTER — Telehealth: Payer: Self-pay | Admitting: *Deleted

## 2017-01-31 NOTE — Telephone Encounter (Signed)
VO OK 

## 2017-01-31 NOTE — Telephone Encounter (Signed)
Requesting back for VO. Please call back.

## 2017-01-31 NOTE — Telephone Encounter (Signed)
VO given for skilled nsg, wound care, medication, disease management,  PT eval and treat Wound care- vaseline gauze, covered in dry drsg, taped to secure Monitor great toe and 5th toe on L foot 2x week for 2 wks 1x week for 3 wks 2 as needed visits PT would like 1x week for 1 wk Then 2xweek for 4 wks

## 2017-01-31 NOTE — Telephone Encounter (Signed)
OK not to take those 4 doses.

## 2017-01-31 NOTE — Telephone Encounter (Signed)
[facility] calls and states pt did not get her abx, they are not on her medlist but I do see where there was only 4 doses left, the script evidently went to mail order instead of local pharm, it has been a week, pt has appt tomorrow w/ dr Midwife, please advise

## 2017-02-01 ENCOUNTER — Ambulatory Visit: Payer: Self-pay | Admitting: Pharmacist

## 2017-02-01 DIAGNOSIS — I130 Hypertensive heart and chronic kidney disease with heart failure and stage 1 through stage 4 chronic kidney disease, or unspecified chronic kidney disease: Secondary | ICD-10-CM | POA: Diagnosis not present

## 2017-02-01 DIAGNOSIS — I2510 Atherosclerotic heart disease of native coronary artery without angina pectoris: Secondary | ICD-10-CM | POA: Diagnosis not present

## 2017-02-01 DIAGNOSIS — E1122 Type 2 diabetes mellitus with diabetic chronic kidney disease: Secondary | ICD-10-CM | POA: Diagnosis not present

## 2017-02-01 DIAGNOSIS — Z7982 Long term (current) use of aspirin: Secondary | ICD-10-CM | POA: Diagnosis not present

## 2017-02-01 DIAGNOSIS — I4892 Unspecified atrial flutter: Secondary | ICD-10-CM | POA: Diagnosis not present

## 2017-02-01 DIAGNOSIS — Z87891 Personal history of nicotine dependence: Secondary | ICD-10-CM | POA: Diagnosis not present

## 2017-02-01 DIAGNOSIS — I5032 Chronic diastolic (congestive) heart failure: Secondary | ICD-10-CM | POA: Diagnosis not present

## 2017-02-01 DIAGNOSIS — N182 Chronic kidney disease, stage 2 (mild): Secondary | ICD-10-CM | POA: Diagnosis not present

## 2017-02-01 DIAGNOSIS — M6281 Muscle weakness (generalized): Secondary | ICD-10-CM | POA: Diagnosis not present

## 2017-02-02 ENCOUNTER — Ambulatory Visit (INDEPENDENT_AMBULATORY_CARE_PROVIDER_SITE_OTHER): Payer: Medicare HMO | Admitting: Internal Medicine

## 2017-02-02 ENCOUNTER — Encounter: Payer: Self-pay | Admitting: Internal Medicine

## 2017-02-02 VITALS — BP 139/56 | HR 93 | Temp 98.4°F | Ht 65.0 in | Wt 326.2 lb

## 2017-02-02 DIAGNOSIS — I483 Typical atrial flutter: Secondary | ICD-10-CM

## 2017-02-02 DIAGNOSIS — I10 Essential (primary) hypertension: Secondary | ICD-10-CM

## 2017-02-02 DIAGNOSIS — I25118 Atherosclerotic heart disease of native coronary artery with other forms of angina pectoris: Secondary | ICD-10-CM

## 2017-02-02 DIAGNOSIS — I2510 Atherosclerotic heart disease of native coronary artery without angina pectoris: Secondary | ICD-10-CM | POA: Diagnosis not present

## 2017-02-02 DIAGNOSIS — I872 Venous insufficiency (chronic) (peripheral): Secondary | ICD-10-CM

## 2017-02-02 DIAGNOSIS — R0602 Shortness of breath: Secondary | ICD-10-CM

## 2017-02-02 DIAGNOSIS — L97509 Non-pressure chronic ulcer of other part of unspecified foot with unspecified severity: Secondary | ICD-10-CM

## 2017-02-02 DIAGNOSIS — N182 Chronic kidney disease, stage 2 (mild): Secondary | ICD-10-CM

## 2017-02-02 DIAGNOSIS — M791 Myalgia: Secondary | ICD-10-CM

## 2017-02-02 DIAGNOSIS — I4892 Unspecified atrial flutter: Secondary | ICD-10-CM | POA: Diagnosis not present

## 2017-02-02 DIAGNOSIS — Z7984 Long term (current) use of oral hypoglycemic drugs: Secondary | ICD-10-CM

## 2017-02-02 DIAGNOSIS — E11621 Type 2 diabetes mellitus with foot ulcer: Secondary | ICD-10-CM

## 2017-02-02 DIAGNOSIS — M549 Dorsalgia, unspecified: Secondary | ICD-10-CM

## 2017-02-02 DIAGNOSIS — Z7982 Long term (current) use of aspirin: Secondary | ICD-10-CM

## 2017-02-02 DIAGNOSIS — E1122 Type 2 diabetes mellitus with diabetic chronic kidney disease: Secondary | ICD-10-CM

## 2017-02-02 DIAGNOSIS — Z0000 Encounter for general adult medical examination without abnormal findings: Secondary | ICD-10-CM

## 2017-02-02 DIAGNOSIS — M7989 Other specified soft tissue disorders: Secondary | ICD-10-CM | POA: Diagnosis not present

## 2017-02-02 DIAGNOSIS — R269 Unspecified abnormalities of gait and mobility: Secondary | ICD-10-CM

## 2017-02-02 DIAGNOSIS — Z7901 Long term (current) use of anticoagulants: Secondary | ICD-10-CM

## 2017-02-02 DIAGNOSIS — Z6841 Body Mass Index (BMI) 40.0 and over, adult: Secondary | ICD-10-CM

## 2017-02-02 DIAGNOSIS — R609 Edema, unspecified: Secondary | ICD-10-CM

## 2017-02-02 DIAGNOSIS — R0600 Dyspnea, unspecified: Secondary | ICD-10-CM | POA: Diagnosis not present

## 2017-02-02 DIAGNOSIS — Z87891 Personal history of nicotine dependence: Secondary | ICD-10-CM

## 2017-02-02 DIAGNOSIS — L97521 Non-pressure chronic ulcer of other part of left foot limited to breakdown of skin: Secondary | ICD-10-CM

## 2017-02-02 LAB — GLUCOSE, CAPILLARY: Glucose-Capillary: 124 mg/dL — ABNORMAL HIGH (ref 65–99)

## 2017-02-02 LAB — POCT GLYCOSYLATED HEMOGLOBIN (HGB A1C): Hemoglobin A1C: 6.4

## 2017-02-02 MED ORDER — DILTIAZEM HCL ER COATED BEADS 360 MG PO CP24: 360.0000 mg | ORAL_CAPSULE | Freq: Every day | ORAL | 3 refills | Status: DC

## 2017-02-02 NOTE — Assessment & Plan Note (Signed)
This problem is chronic and stable. Weight overall is stable. 324 in July today 326. Weighs herself at home.  PLAN : follow  Wt Readings from Last 3 Encounters:  02/02/17 (!) 326 lb 3.2 oz (148 kg)  01/25/17 (!) 325 lb 6.4 oz (147.6 kg)  01/19/17 (!) 328 lb (148.8 kg)

## 2017-02-02 NOTE — Assessment & Plan Note (Signed)
Gaba stopped inpt. Pt's neck PHN resolved. Fingertip numbness still present but not sig. OK with stopping gaba.

## 2017-02-02 NOTE — Patient Instructions (Signed)
1. Increase your diltiazem to 3 pills a day.  Let me know if you get dizzy or lightheaded Your nurse will check your heart rate and blood pressure  2. Your sugar is excellent  3. I will look into tests for your blood circulation

## 2017-02-02 NOTE — Assessment & Plan Note (Signed)
This problem is chronic and stable. A1C trend is 7.3 - 6.8 - 6.4 today. On metformin 500 BID. No CBG's at home. No SE to meds or hypoglycemia. Wants to think about eye exam.  PLAN:  Cont current meds Encourage eye exam

## 2017-02-02 NOTE — Addendum Note (Signed)
Addended by: Blanch Media A on: 02/02/2017 01:26 PM   Modules accepted: Orders

## 2017-02-02 NOTE — Assessment & Plan Note (Signed)
This problem is chronic and stable. No symptoms but not very active. On BB, ASA, and statin (atorva 40). No SE to meds.  PLAN:  Cont current meds

## 2017-02-02 NOTE — Assessment & Plan Note (Signed)
This problem is chronic and stable. HR is 93. On eliquis, coreg 25 BID, dilt 240, and amiodarone. Asymptomatic and no SE to meds.  PLAN : increase dilt to 360

## 2017-02-02 NOTE — Assessment & Plan Note (Signed)
This problem is chronic and somewhat controlled. On dilt 240, coreg 25 BID, lasix 80 BID, hydral 50 TID. States BP was 140's at home with home health. With DM, would prefer a slightly lower BP. BP and HR can tolerate increase in Dilt.  PLAN : increase dilt to 360. Home health to check HR and BP

## 2017-02-02 NOTE — Progress Notes (Signed)
   Subjective:    Patient ID: Jamie Mason, female    DOB: 30-Dec-1940, 76 y.o.   MRN: [id number]  HPI  LULAR ELIE is here for HFU and LLE wound. Please see the A&P for the status of the pt's chronic medical problems.  ROS : per ROS section and in problem oriented charting. All other systems are negative.  PMHx, Soc hx, and / or Fam hx : Daughter lives with her - teaches school. Another daughter comes during day to stay with her. No longer cooking or cleaning - daughters do that. Has not replaced hospital bed but son bought her new mattress.  Review of Systems  Respiratory: Positive for shortness of breath.        +DOE  Cardiovascular: Positive for leg swelling. Negative for chest pain and palpitations.  Musculoskeletal: Positive for back pain, gait problem and myalgias.       Upper thighs hurt in AM.  Neurological: Negative for dizziness and light-headedness.       Objective:   Physical Exam  Constitutional: She appears well-developed and well-nourished. No distress.  HENT:  Head: Normocephalic and atraumatic.  Right Ear: External ear normal.  Left Ear: External ear normal.  Nose: Nose normal.  Eyes: Conjunctivae and EOM are normal.  Pulmonary/Chest: Effort normal.  Musculoskeletal: Normal range of motion. She exhibits edema and tenderness.  Non pitting edema, woody skin LE B; trace pitting in RLE  Neurological: She is alert.  Skin: Skin is warm and dry. She is not diaphoretic.  2 cm medical wound L lower ext, well healing, min drainage, pink base  Psychiatric: She has a normal mood and affect. Her behavior is normal. Judgment and thought content normal.      Assessment & Plan:

## 2017-02-02 NOTE — Assessment & Plan Note (Signed)
This problem is new. Her wound looks excellent and I assured her that she did not need final 4 ABX pills. Getting home wound care. Not certain if DM wound or 2/2 chronic venous insuff. ABI attempted inpt in Jan but not successful 2/2 woody skin and edema. I did not attempt ABI's here for same reason. Spoke to vascular center - referral rather than arterial dopplers.  PLAN : HH wound care Referral to vascular

## 2017-02-02 NOTE — Assessment & Plan Note (Signed)
This problem is chronic and stable. We reviewed her Cr. I did not repeat today bc steadily decrease prior to D/C.  PLAN : follow and educate

## 2017-02-03 ENCOUNTER — Other Ambulatory Visit: Payer: Self-pay | Admitting: Pharmacist

## 2017-02-03 DIAGNOSIS — I4892 Unspecified atrial flutter: Secondary | ICD-10-CM | POA: Diagnosis not present

## 2017-02-03 DIAGNOSIS — Z87891 Personal history of nicotine dependence: Secondary | ICD-10-CM | POA: Diagnosis not present

## 2017-02-03 DIAGNOSIS — I5032 Chronic diastolic (congestive) heart failure: Secondary | ICD-10-CM | POA: Diagnosis not present

## 2017-02-03 DIAGNOSIS — I130 Hypertensive heart and chronic kidney disease with heart failure and stage 1 through stage 4 chronic kidney disease, or unspecified chronic kidney disease: Secondary | ICD-10-CM | POA: Diagnosis not present

## 2017-02-03 DIAGNOSIS — Z7982 Long term (current) use of aspirin: Secondary | ICD-10-CM | POA: Diagnosis not present

## 2017-02-03 DIAGNOSIS — N182 Chronic kidney disease, stage 2 (mild): Secondary | ICD-10-CM | POA: Diagnosis not present

## 2017-02-03 DIAGNOSIS — E1122 Type 2 diabetes mellitus with diabetic chronic kidney disease: Secondary | ICD-10-CM | POA: Diagnosis not present

## 2017-02-03 DIAGNOSIS — I2510 Atherosclerotic heart disease of native coronary artery without angina pectoris: Secondary | ICD-10-CM | POA: Diagnosis not present

## 2017-02-03 DIAGNOSIS — M6281 Muscle weakness (generalized): Secondary | ICD-10-CM | POA: Diagnosis not present

## 2017-02-03 NOTE — Patient Outreach (Signed)
[facility]) Care Management  02/03/2017  Jamie Mason [birth year] [id number]  Call placed to patient to follow-up on medications following her hospital discharge and status of her patient assistance application for inhaler.  Gentleman answered phone and reported patient unavailable at time of call.   Plan:  Will make outreach attempt next week.   [name], PharmD, [facility] Clinical Pharmacist [facility] [phone]

## 2017-02-06 ENCOUNTER — Other Ambulatory Visit: Payer: Self-pay | Admitting: Pharmacist

## 2017-02-06 NOTE — Patient Outreach (Signed)
[facility]) Care Management  [facility] CM Pharmacy   02/06/2017  Jamie Mason May 07, 1941 [id number]  Subjective:  Patient was initially referred to [facility] CM Pharmacist for medication assistance with albuterol inhaler.  Patient was admitted at [facility] 8/9-8/15/18 for a lower extremity wound.    She reports she followed up with her PCP last week.  She reports her PCP had stopped her antibiotics at that appointment.   Patient was able to review medication changes made on discharge and by her PCP over the phone.  She was able to verbalize the changes made.    She reports she has the patient assistance application for [employer] but has not filled it out yet.     Objective:   Current Medications: Current Outpatient Prescriptions  Medication Sig Dispense Refill  . albuterol (PROVENTIL HFA;VENTOLIN HFA) 108 (90 Base) MCG/ACT inhaler Inhale 2 puffs into the lungs every 6 (six) hours as needed for wheezing or shortness of breath. 1 Inhaler 3  . amiodarone (PACERONE) 200 MG tablet Take 1 tablet (200 mg total) by mouth daily. 90 tablet 3  . apixaban (ELIQUIS) 5 MG TABS tablet Take 1 tablet (5 mg total) by mouth 2 (two) times daily. 180 tablet 3  . aspirin (ASPIR-LOW) 81 MG EC tablet Take 81 mg by mouth daily. Swallow whole.    Marland Kitchen atorvastatin (LIPITOR) 40 MG tablet Take 1 tablet (40 mg total) by mouth daily. 90 tablet 3  . carvedilol (COREG) 25 MG tablet Take 1 tablet (25 mg total) by mouth 2 (two) times daily with a meal. 90 tablet 3  . furosemide (LASIX) 40 MG tablet Take 1 tablet (40 mg total) by mouth 2 (two) times daily. Take 2 tablets by mouth every morning and 1 tablet by mouth every afternoon (Patient taking differently: Take 40 mg by mouth 2 (two) times daily. ) 120 tablet 2  . hydrALAZINE (APRESOLINE) 50 MG tablet Take 1 tablet (50 mg total) by mouth 3 (three) times daily. 90 tablet 2  . metFORMIN (GLUCOPHAGE) 500 MG tablet Take 1 tablet (500 mg total) by mouth 2  (two) times daily with a meal. 180 tablet 3  . diltiazem (CARDIZEM CD) 360 MG 24 hr capsule Take 1 capsule (360 mg total) by mouth daily. 90 capsule 3  . hydrocerin (EUCERIN) CREA Apply to bilateral LEs after cleansing on Mondays and Thursdays (prior to [employer] application). 113 g 0  . polyethylene glycol powder (GLYCOLAX/MIRALAX) powder MIX 1 CAPFUL (17GM) IN LIQUID AND DRINK EVERY DAY 578 g 2   No current facility-administered medications for this visit.     Functional Status: In your present state of health, do you have any difficulty performing the following activities: 02/02/2017 01/19/2017  Hearing? N N  Vision? N N  Difficulty concentrating or making decisions? N N  Walking or climbing stairs? Y Y  Dressing or bathing? Y Y  Doing errands, shopping? Y Y  Some recent data might be hidden    Fall/Depression Screening: Fall Risk  02/02/2017 12/23/2016 12/01/2016  Falls in the past year? No No No  Number falls in past yr: - - -  Injury with Fall? - - -  Risk Factor Category  - High Fall Risk High Fall Risk  Risk for fall due to : - - Impaired balance/gait  Risk for fall due to: Comment - - -  Follow up - - Falls prevention discussed   PHQ 2/9 Scores 02/02/2017 12/23/2016 12/01/2016 11/15/2016 11/11/2016 10/19/2016  08/16/2016  PHQ - 2 Score 0 0 0 0 0 0 1    Assessment:  Medication review per patient report, discharge medication list, review of 02/02/17 PCP office note, and medication list in chart.  Patient was recently discharged from hospital and all medications have been reviewed.  Drugs sorted by system:  Cardiovascular: -amiodarone  -apixaban  -aspirin  -atorvastatin  -carvedilol  -diltiazem---was previously using 120 mg caps taking 2 caps daily ---dose increased by PCP to 360 mg daily and patient reports taking three 120 mg capsules until her new prescription arrives from mail order.  She understands to only take one 360 mg capsule daily when new prescription arrives.    -furosemide---patient reports taking two 40 mg tabs twice daily (confirmed with 02/02/17 PCP note)  -hydralazine   Pulmonary/Allergy: -albuterol inhaler   Endocrine: -metformin   Patient assistance: Counseled patient importance on of completing her portion of [employer] patient assistance application.  Counseled patient unable to tell her if she will be approved or not but application needs to be submitted to determine if manufacturer will approve her or not.    She reports she will complete and mail application back this week.    Plan:  Will continue to follow-up with patient regarding her application for patient assistance for albuterol.    Jamie Mason, PharmD, [facility] Clinical Pharmacist [facility] [phone]

## 2017-02-06 NOTE — Patient Outreach (Signed)
[facility]) Care Management  02/06/2017  Jamie Mason 1941/02/17 [id number]

## 2017-02-07 ENCOUNTER — Ambulatory Visit (INDEPENDENT_AMBULATORY_CARE_PROVIDER_SITE_OTHER): Payer: Medicare HMO | Admitting: Sports Medicine

## 2017-02-07 ENCOUNTER — Encounter: Payer: Self-pay | Admitting: Sports Medicine

## 2017-02-07 DIAGNOSIS — E1142 Type 2 diabetes mellitus with diabetic polyneuropathy: Secondary | ICD-10-CM | POA: Diagnosis not present

## 2017-02-07 DIAGNOSIS — B351 Tinea unguium: Secondary | ICD-10-CM | POA: Diagnosis not present

## 2017-02-07 DIAGNOSIS — M79675 Pain in left toe(s): Secondary | ICD-10-CM

## 2017-02-07 DIAGNOSIS — I872 Venous insufficiency (chronic) (peripheral): Secondary | ICD-10-CM | POA: Diagnosis not present

## 2017-02-07 DIAGNOSIS — M79674 Pain in right toe(s): Secondary | ICD-10-CM | POA: Diagnosis not present

## 2017-02-07 NOTE — Progress Notes (Signed)
   Subjective:    Patient ID: Jamie Mason, female    DOB: June 08, 1941, 76 y.o.   MRN: [id number]  HPI  Chief Complaint  Patient presents with  . diabetic foot care  . Wound Check    Lt ankle       Review of Systems  Respiratory: Positive for shortness of breath.   Cardiovascular: Positive for leg swelling.  Musculoskeletal: Positive for arthralgias, back pain, gait problem and myalgias.  Skin: Positive for wound.  All other systems reviewed and are negative.      Objective:   Physical Exam        Assessment & Plan:

## 2017-02-07 NOTE — Progress Notes (Signed)
Subjective: Jamie Mason is a 76 y.o. female patient with history of diabetes who presents to office today complaining of long, painful nails  while ambulating in shoes; unable to trim. Patient states that the glucose reading this morning was not recorded. Patient denies any new changes in medication or new problems. Patient denies any new cramping, numbness, burning or tingling in the legs. Admits to history of left leg wound that has healed over and has been protecting with guaze. Denies any constutional symptoms.   Patient Active Problem List   Diagnosis Date Noted  . Diabetic foot ulcer (HCC) 01/19/2017  . Chronic diastolic CHF (congestive heart failure) (HCC) 10/19/2016  . Atrial flutter (HCC) 07/11/2016  . Chronic venous insufficiency 06/30/2016  . Goals of care, counseling/discussion 07/24/2015  . Severe obesity (BMI >= 40) (HCC) 08/28/2014  . 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 Healthcare maintenance 03/19/2014  . CAD (coronary artery disease) 05/03/2011  . Hypertension 05/02/2011  . Hyperlipidemia 05/02/2011  . Lumbar disc disease 05/02/2011   Current Outpatient Prescriptions on File Prior to Visit  Medication Sig Dispense Refill  . albuterol (PROVENTIL HFA;VENTOLIN HFA) 108 (90 Base) MCG/ACT inhaler Inhale 2 puffs into the lungs every 6 (six) hours as needed for wheezing or shortness of breath. 1 Inhaler 3  . amiodarone (PACERONE) 200 MG tablet Take 1 tablet (200 mg total) by mouth daily. 90 tablet 3  . apixaban (ELIQUIS) 5 MG TABS tablet Take 1 tablet (5 mg total) by mouth 2 (two) times daily. 180 tablet 3  . aspirin (ASPIR-LOW) 81 MG EC tablet Take 81 mg by mouth daily. Swallow whole.    Marland Kitchen atorvastatin (LIPITOR) 40 MG tablet Take 1 tablet (40 mg total) by mouth daily. 90 tablet 3  . carvedilol (COREG) 25 MG tablet Take 1 tablet (25 mg total) by mouth 2 (two) times daily with a meal. 90 tablet 3  . diltiazem (CARDIZEM CD) 360 MG 24  hr capsule Take 1 capsule (360 mg total) by mouth daily. 90 capsule 3  . furosemide (LASIX) 40 MG tablet Take 1 tablet (40 mg total) by mouth 2 (two) times daily. Take 2 tablets by mouth every morning and 1 tablet by mouth every afternoon (Patient taking differently: Take 40 mg by mouth 2 (two) times daily. ) 120 tablet 2  . hydrALAZINE (APRESOLINE) 50 MG tablet Take 1 tablet (50 mg total) by mouth 3 (three) times daily. 90 tablet 2  . hydrocerin (EUCERIN) CREA Apply to bilateral LEs after cleansing on Mondays and Thursdays (prior to [employer] application). 113 g 0  . metFORMIN (GLUCOPHAGE) 500 MG tablet Take 1 tablet (500 mg total) by mouth 2 (two) times daily with a meal. 180 tablet 3  . polyethylene glycol powder (GLYCOLAX/MIRALAX) powder MIX 1 CAPFUL (17GM) IN LIQUID AND DRINK EVERY DAY 578 g 2   No current facility-administered medications on file prior to visit.    Allergies  Allergen Reactions  . Lisinopril Swelling    Tongue swelling   . Ace Inhibitors Other (See Comments)    unknown  . Codeine Itching and Rash  . Penicillins Itching and Rash    Has patient had a PCN reaction causing immediate rash, facial/tongue/throat swelling, SOB or lightheadedness with hypotension: unknown Has patient had a PCN reaction causing severe rash involving mucus membranes or skin necrosis: unknown Has patient had a PCN reaction that required hospitalization: no Has patient had a PCN reaction occurring within  the last 10 years: no If all of the above answers are "NO", then may proceed with Cephalosporin use.  . Tomato     Itchy rash   Objective: General: Patient is awake, alert, and oriented x 3 and in no acute distress.  Integument: Skin is warm, dry and supple bilateral. Nails are tender, extremely long, thickened and dystrophic with subungual debris, consistent with onychomycosis, 1-5 bilateral. No signs of infection. No open lesions or preulcerative lesions present bilateral. Healed left leg  ulceration with dry scabbing, with no signs of infection. Remaining integument unremarkable.  Vasculature:  Dorsalis Pedis pulse 0/4 bilateral. Posterior Tibial pulse  0/4 bilateral. Capillary fill time <3 sec 1-5 bilateral. No hair growth to the level of the digits.Temperature gradient within normal limits. No varicosities present bilateral. Lymphedema present bilateral.   Neurology: The patient has absent sensation measured with a 5.07/10g Semmes Weinstein Monofilament at all pedal sites bilateral . Vibratory sensation diminished bilateral with tuning fork. No Babinski sign present bilateral.   Musculoskeletal: Asymptomatic pes planus pedal deformities noted bilateral. Muscular strength 5/5 in all lower extremity muscular groups bilateral without pain on range of motion. No tenderness with calf compression bilateral.  Assessment and Plan: Problem List Items Addressed This Visit    None    Visit Diagnoses    Pain due to onychomycosis of toenails of both feet    -  Primary   Venous stasis dermatitis of both lower extremities       left   Diabetic polyneuropathy associated with type 2 diabetes mellitus (HCC)          -Examined patient. -Discussed and educated patient on diabetic foot care, especially with  regards to the vascular, neurological and musculoskeletal systems.  -Stressed the importance of good glycemic control and the detriment of not  controlling glucose levels in relation to the foot. -Mechanically debrided all nails 1-5 bilateral using sterile nail nipper and filed with dremel without incident  -Applied protective bandage of antibiotic cream and bandaid to healed left leg previous ulceration which is now scabbed over and advised patient to cont. With the same daily until scab falls off -Answered all patient questions -Patient to return  in 3 months for at risk foot care -Patient advised to call the office if any problems or questions arise in the meantime.  Asencion Islam, DPM

## 2017-02-08 ENCOUNTER — Other Ambulatory Visit: Payer: Self-pay | Admitting: *Deleted

## 2017-02-08 ENCOUNTER — Telehealth: Payer: Self-pay | Admitting: *Deleted

## 2017-02-08 NOTE — Telephone Encounter (Signed)
She doesn't need a CBC or BMP as labs are stable

## 2017-02-08 NOTE — Patient Outreach (Signed)
[facility] Care Management  02/08/2017  Jamie Mason 06/29/1940 [id number]   Weekly transition of care call placed to member.  She report she is doing well, denies any shortness of breath or chest discomfort.  She confirms that she did see her primary MD last week, last doses of antibiotics were not given due to the amount of time her discharge and realizing she did not have medications in addition to wound healing.  She does report that she had dose changes on one medication, report taking all as prescribed.  She also report that she had first visit with podiatrist, which went well, next visit in 3 months.    She report that she has restarted daily weights, today was 323 pounds.  Proper low sodium diet again discussed and how not adhering to diet (as well as fluid restrictions and medication management) can lead to complications with heart failure, increasing risk for readmission.  She verbalizes understanding.  She state home health nursing and PT remain active.  Denies questions at this time, routine home visit scheduled for next week.  Kemper DurieMonica Bralin Garry, [place]RN, MSN [facility] Care Management  [facility] Care Manager [phone]

## 2017-02-08 NOTE — Telephone Encounter (Signed)
[facility] nurse requesting VO for recheck/2 wk f/u CBC,BMP (last done inpatient 01/25/17). She will fax results.

## 2017-02-08 NOTE — Telephone Encounter (Signed)
Spoke w/ lisa and she will cancel the order from the nurse navigator.

## 2017-02-09 DIAGNOSIS — I2510 Atherosclerotic heart disease of native coronary artery without angina pectoris: Secondary | ICD-10-CM | POA: Diagnosis not present

## 2017-02-09 DIAGNOSIS — Z7982 Long term (current) use of aspirin: Secondary | ICD-10-CM | POA: Diagnosis not present

## 2017-02-09 DIAGNOSIS — I130 Hypertensive heart and chronic kidney disease with heart failure and stage 1 through stage 4 chronic kidney disease, or unspecified chronic kidney disease: Secondary | ICD-10-CM | POA: Diagnosis not present

## 2017-02-09 DIAGNOSIS — E1122 Type 2 diabetes mellitus with diabetic chronic kidney disease: Secondary | ICD-10-CM | POA: Diagnosis not present

## 2017-02-09 DIAGNOSIS — I5032 Chronic diastolic (congestive) heart failure: Secondary | ICD-10-CM | POA: Diagnosis not present

## 2017-02-09 DIAGNOSIS — N182 Chronic kidney disease, stage 2 (mild): Secondary | ICD-10-CM | POA: Diagnosis not present

## 2017-02-09 DIAGNOSIS — I4892 Unspecified atrial flutter: Secondary | ICD-10-CM | POA: Diagnosis not present

## 2017-02-09 DIAGNOSIS — M6281 Muscle weakness (generalized): Secondary | ICD-10-CM | POA: Diagnosis not present

## 2017-02-09 DIAGNOSIS — Z87891 Personal history of nicotine dependence: Secondary | ICD-10-CM | POA: Diagnosis not present

## 2017-02-13 DIAGNOSIS — L8990 Pressure ulcer of unspecified site, unspecified stage: Secondary | ICD-10-CM | POA: Diagnosis not present

## 2017-02-13 DIAGNOSIS — J9620 Acute and chronic respiratory failure, unspecified whether with hypoxia or hypercapnia: Secondary | ICD-10-CM | POA: Diagnosis not present

## 2017-02-13 DIAGNOSIS — I2510 Atherosclerotic heart disease of native coronary artery without angina pectoris: Secondary | ICD-10-CM | POA: Diagnosis not present

## 2017-02-15 ENCOUNTER — Other Ambulatory Visit: Payer: Self-pay

## 2017-02-15 ENCOUNTER — Other Ambulatory Visit: Payer: Self-pay | Admitting: Pharmacist

## 2017-02-15 DIAGNOSIS — E1122 Type 2 diabetes mellitus with diabetic chronic kidney disease: Secondary | ICD-10-CM | POA: Diagnosis not present

## 2017-02-15 DIAGNOSIS — Z7982 Long term (current) use of aspirin: Secondary | ICD-10-CM | POA: Diagnosis not present

## 2017-02-15 DIAGNOSIS — I130 Hypertensive heart and chronic kidney disease with heart failure and stage 1 through stage 4 chronic kidney disease, or unspecified chronic kidney disease: Secondary | ICD-10-CM | POA: Diagnosis not present

## 2017-02-15 DIAGNOSIS — M6281 Muscle weakness (generalized): Secondary | ICD-10-CM | POA: Diagnosis not present

## 2017-02-15 DIAGNOSIS — I2510 Atherosclerotic heart disease of native coronary artery without angina pectoris: Secondary | ICD-10-CM | POA: Diagnosis not present

## 2017-02-15 DIAGNOSIS — I872 Venous insufficiency (chronic) (peripheral): Secondary | ICD-10-CM

## 2017-02-15 DIAGNOSIS — I4892 Unspecified atrial flutter: Secondary | ICD-10-CM | POA: Diagnosis not present

## 2017-02-15 DIAGNOSIS — N182 Chronic kidney disease, stage 2 (mild): Secondary | ICD-10-CM | POA: Diagnosis not present

## 2017-02-15 DIAGNOSIS — I5032 Chronic diastolic (congestive) heart failure: Secondary | ICD-10-CM | POA: Diagnosis not present

## 2017-02-15 DIAGNOSIS — Z87891 Personal history of nicotine dependence: Secondary | ICD-10-CM | POA: Diagnosis not present

## 2017-02-15 NOTE — Patient Outreach (Signed)
[facility]) Care Management  02/15/2017  Jamie NighGloria C Mason 06/20/1940 [id number]  Successful phone outreach to patient.  Patient reports she completed and mailed back her portion of patient assistance application the end of last week.  Patient reports she [facility] her [facility] and reports she is taking one 360 mg capsule of diltiazem now.    Plan:  Will look out for patient assistance application she reports she mailed back to [facility] main [facility].  Tommye StandardKevin Raiana Pharris, PharmD, [facility] Center[facility]ACP Clinical Pharmacist [facility] [phone]

## 2017-02-16 ENCOUNTER — Other Ambulatory Visit: Payer: Self-pay | Admitting: *Deleted

## 2017-02-16 NOTE — Patient Outreach (Signed)
[facility] Care Management  02/16/2017  Jamie NighGloria C Mason 04/02/1941 [id number]   Home visit scheduled for today.  Call received from member stating that she is doing well, was seen by the home health nurse yesterday and will be seen by the physical therapist tomorrow.  She report she does not feel a home visit is needed today, calling to report current health status and cancel visit.  This care manager discussed benefits of home visit, she continues to deny the need at this time.  She report she has been attempting to work on adhering to her heart failure diet and has been weighing herself on a regular basis.  She report her weight today being 322 pounds, stating "thats a big deal for me."  Reassurance provided to continue being compliant to plan of care.  She report she has been taking medications as prescribed.  She state she has completed paperwork for medication assistance and mailed it back [facility], Jamie Mason.  She report she is out of albuterol, but has requested a refill to be sent using expedited delivery.  She denies shortness of breath at this time.  Member state that her leg wound is completely healed and she no longer has to do dressing changes.  She will continue to assess her legs/feet on a daily basis.  She denies any concerns at this time, advised to contact with questions.  Will follow up next week.  Jamie DurieMonica Zalyn Mason, [place]RN, MSN [facility] Care Management  [facility] Care Manager [phone]

## 2017-02-17 DIAGNOSIS — Z7982 Long term (current) use of aspirin: Secondary | ICD-10-CM | POA: Diagnosis not present

## 2017-02-17 DIAGNOSIS — M6281 Muscle weakness (generalized): Secondary | ICD-10-CM | POA: Diagnosis not present

## 2017-02-17 DIAGNOSIS — E1122 Type 2 diabetes mellitus with diabetic chronic kidney disease: Secondary | ICD-10-CM | POA: Diagnosis not present

## 2017-02-17 DIAGNOSIS — N182 Chronic kidney disease, stage 2 (mild): Secondary | ICD-10-CM | POA: Diagnosis not present

## 2017-02-17 DIAGNOSIS — I2510 Atherosclerotic heart disease of native coronary artery without angina pectoris: Secondary | ICD-10-CM | POA: Diagnosis not present

## 2017-02-17 DIAGNOSIS — I130 Hypertensive heart and chronic kidney disease with heart failure and stage 1 through stage 4 chronic kidney disease, or unspecified chronic kidney disease: Secondary | ICD-10-CM | POA: Diagnosis not present

## 2017-02-17 DIAGNOSIS — I5032 Chronic diastolic (congestive) heart failure: Secondary | ICD-10-CM | POA: Diagnosis not present

## 2017-02-17 DIAGNOSIS — I4892 Unspecified atrial flutter: Secondary | ICD-10-CM | POA: Diagnosis not present

## 2017-02-17 DIAGNOSIS — Z87891 Personal history of nicotine dependence: Secondary | ICD-10-CM | POA: Diagnosis not present

## 2017-02-20 ENCOUNTER — Other Ambulatory Visit: Payer: Self-pay | Admitting: Internal Medicine

## 2017-02-21 DIAGNOSIS — I4892 Unspecified atrial flutter: Secondary | ICD-10-CM | POA: Diagnosis not present

## 2017-02-21 DIAGNOSIS — I5032 Chronic diastolic (congestive) heart failure: Secondary | ICD-10-CM | POA: Diagnosis not present

## 2017-02-21 DIAGNOSIS — Z7982 Long term (current) use of aspirin: Secondary | ICD-10-CM | POA: Diagnosis not present

## 2017-02-21 DIAGNOSIS — I130 Hypertensive heart and chronic kidney disease with heart failure and stage 1 through stage 4 chronic kidney disease, or unspecified chronic kidney disease: Secondary | ICD-10-CM | POA: Diagnosis not present

## 2017-02-21 DIAGNOSIS — Z87891 Personal history of nicotine dependence: Secondary | ICD-10-CM | POA: Diagnosis not present

## 2017-02-21 DIAGNOSIS — M6281 Muscle weakness (generalized): Secondary | ICD-10-CM | POA: Diagnosis not present

## 2017-02-21 DIAGNOSIS — N182 Chronic kidney disease, stage 2 (mild): Secondary | ICD-10-CM | POA: Diagnosis not present

## 2017-02-21 DIAGNOSIS — I2510 Atherosclerotic heart disease of native coronary artery without angina pectoris: Secondary | ICD-10-CM | POA: Diagnosis not present

## 2017-02-21 DIAGNOSIS — E1122 Type 2 diabetes mellitus with diabetic chronic kidney disease: Secondary | ICD-10-CM | POA: Diagnosis not present

## 2017-02-21 NOTE — Telephone Encounter (Signed)
Used 3 month QD supply in 2 months. I have documented my concerns and her refusal colon cancer screen. Symptomatic tx of her constipation

## 2017-02-22 DIAGNOSIS — E1122 Type 2 diabetes mellitus with diabetic chronic kidney disease: Secondary | ICD-10-CM | POA: Diagnosis not present

## 2017-02-22 DIAGNOSIS — N182 Chronic kidney disease, stage 2 (mild): Secondary | ICD-10-CM | POA: Diagnosis not present

## 2017-02-22 DIAGNOSIS — I5032 Chronic diastolic (congestive) heart failure: Secondary | ICD-10-CM | POA: Diagnosis not present

## 2017-02-22 DIAGNOSIS — I2510 Atherosclerotic heart disease of native coronary artery without angina pectoris: Secondary | ICD-10-CM | POA: Diagnosis not present

## 2017-02-22 DIAGNOSIS — I4892 Unspecified atrial flutter: Secondary | ICD-10-CM | POA: Diagnosis not present

## 2017-02-22 DIAGNOSIS — Z87891 Personal history of nicotine dependence: Secondary | ICD-10-CM | POA: Diagnosis not present

## 2017-02-22 DIAGNOSIS — Z7982 Long term (current) use of aspirin: Secondary | ICD-10-CM | POA: Diagnosis not present

## 2017-02-22 DIAGNOSIS — M6281 Muscle weakness (generalized): Secondary | ICD-10-CM | POA: Diagnosis not present

## 2017-02-22 DIAGNOSIS — I130 Hypertensive heart and chronic kidney disease with heart failure and stage 1 through stage 4 chronic kidney disease, or unspecified chronic kidney disease: Secondary | ICD-10-CM | POA: Diagnosis not present

## 2017-02-23 ENCOUNTER — Other Ambulatory Visit: Payer: Self-pay | Admitting: *Deleted
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
## 2017-02-24 DIAGNOSIS — I5032 Chronic diastolic (congestive) heart failure: Secondary | ICD-10-CM | POA: Diagnosis not present

## 2017-02-24 DIAGNOSIS — Z87891 Personal history of nicotine dependence: Secondary | ICD-10-CM | POA: Diagnosis not present

## 2017-02-24 DIAGNOSIS — I2510 Atherosclerotic heart disease of native coronary artery without angina pectoris: Secondary | ICD-10-CM | POA: Diagnosis not present

## 2017-02-24 DIAGNOSIS — M6281 Muscle weakness (generalized): Secondary | ICD-10-CM | POA: Diagnosis not present

## 2017-02-24 DIAGNOSIS — I4892 Unspecified atrial flutter: Secondary | ICD-10-CM | POA: Diagnosis not present

## 2017-02-24 DIAGNOSIS — I130 Hypertensive heart and chronic kidney disease with heart failure and stage 1 through stage 4 chronic kidney disease, or unspecified chronic kidney disease: Secondary | ICD-10-CM | POA: Diagnosis not present

## 2017-02-24 DIAGNOSIS — Z7982 Long term (current) use of aspirin: Secondary | ICD-10-CM | POA: Diagnosis not present

## 2017-02-24 DIAGNOSIS — N182 Chronic kidney disease, stage 2 (mild): Secondary | ICD-10-CM | POA: Diagnosis not present

## 2017-02-24 DIAGNOSIS — E1122 Type 2 diabetes mellitus with diabetic chronic kidney disease: Secondary | ICD-10-CM | POA: Diagnosis not present

## 2017-03-03 ENCOUNTER — Telehealth: Payer: Self-pay | Admitting: *Deleted

## 2017-03-03 ENCOUNTER — Other Ambulatory Visit: Payer: Self-pay | Admitting: *Deleted

## 2017-03-03 NOTE — Telephone Encounter (Signed)
VO OK Pls sch [facility] appt to discuss pain

## 2017-03-03 NOTE — Telephone Encounter (Signed)
PT from [facility] stated patient having substantial amt of back pain affecting mobility. Has noticed patient's pain getting worse & going down the legs Asking if patient can be referred to ortho or pain management. Advised that patient will likely need an appt here. Because of this she would like to extend PT 2x/wk for 3 wks & work on stretches for back. Is this ok? She will also inform patient to schedule an appt with [place]. Has appt w/ pcp 11.29.18

## 2017-03-03 NOTE — Patient Outreach (Signed)
[facility]) Care Management  03/03/2017  VENDETTA PITTINGER 1941/05/15 [id number]   Weekly transition of care call placed to member.  She report she is "doing good."  She denies any chest pain or shortness of breath.  Report her weight was down to 311 pounds earlier this week, but is not back up to 314.  Reminded of heart failure management/care plan, including diet and when to contact MD.  She verbalizes understanding.  She is made aware that transition of care goals are complete, recognition provided for goals met.  She agrees to home visit next month, denies any urgent concerns at this time.  Valente David, [place], MSN [place] [phone]

## 2017-03-06 DIAGNOSIS — N183 Chronic kidney disease, stage 3 (moderate): Secondary | ICD-10-CM | POA: Diagnosis not present

## 2017-03-06 DIAGNOSIS — I130 Hypertensive heart and chronic kidney disease with heart failure and stage 1 through stage 4 chronic kidney disease, or unspecified chronic kidney disease: Secondary | ICD-10-CM | POA: Diagnosis not present

## 2017-03-06 DIAGNOSIS — I4891 Unspecified atrial fibrillation: Secondary | ICD-10-CM | POA: Diagnosis not present

## 2017-03-06 DIAGNOSIS — I5032 Chronic diastolic (congestive) heart failure: Secondary | ICD-10-CM | POA: Diagnosis not present

## 2017-03-06 DIAGNOSIS — J449 Chronic obstructive pulmonary disease, unspecified: Secondary | ICD-10-CM | POA: Diagnosis not present

## 2017-03-06 DIAGNOSIS — I4892 Unspecified atrial flutter: Secondary | ICD-10-CM | POA: Diagnosis not present

## 2017-03-06 DIAGNOSIS — I2510 Atherosclerotic heart disease of native coronary artery without angina pectoris: Secondary | ICD-10-CM | POA: Diagnosis not present

## 2017-03-06 DIAGNOSIS — I872 Venous insufficiency (chronic) (peripheral): Secondary | ICD-10-CM | POA: Diagnosis not present

## 2017-03-06 DIAGNOSIS — E1122 Type 2 diabetes mellitus with diabetic chronic kidney disease: Secondary | ICD-10-CM | POA: Diagnosis not present

## 2017-03-08 DIAGNOSIS — E1122 Type 2 diabetes mellitus with diabetic chronic kidney disease: Secondary | ICD-10-CM | POA: Diagnosis not present

## 2017-03-08 DIAGNOSIS — I4892 Unspecified atrial flutter: Secondary | ICD-10-CM | POA: Diagnosis not present

## 2017-03-08 DIAGNOSIS — I2510 Atherosclerotic heart disease of native coronary artery without angina pectoris: Secondary | ICD-10-CM | POA: Diagnosis not present

## 2017-03-08 DIAGNOSIS — N183 Chronic kidney disease, stage 3 (moderate): Secondary | ICD-10-CM | POA: Diagnosis not present

## 2017-03-08 DIAGNOSIS — I4891 Unspecified atrial fibrillation: Secondary | ICD-10-CM | POA: Diagnosis not present

## 2017-03-08 DIAGNOSIS — I5032 Chronic diastolic (congestive) heart failure: Secondary | ICD-10-CM | POA: Diagnosis not present

## 2017-03-08 DIAGNOSIS — I872 Venous insufficiency (chronic) (peripheral): Secondary | ICD-10-CM | POA: Diagnosis not present

## 2017-03-08 DIAGNOSIS — I130 Hypertensive heart and chronic kidney disease with heart failure and stage 1 through stage 4 chronic kidney disease, or unspecified chronic kidney disease: Secondary | ICD-10-CM | POA: Diagnosis not present

## 2017-03-08 DIAGNOSIS — J449 Chronic obstructive pulmonary disease, unspecified: Secondary | ICD-10-CM | POA: Diagnosis not present

## 2017-03-09 ENCOUNTER — Ambulatory Visit (INDEPENDENT_AMBULATORY_CARE_PROVIDER_SITE_OTHER): Payer: Medicare HMO | Admitting: Internal Medicine

## 2017-03-09 VITALS — BP 164/73 | HR 98 | Temp 98.1°F | Ht 65.5 in | Wt 315.7 lb

## 2017-03-09 DIAGNOSIS — I4891 Unspecified atrial fibrillation: Secondary | ICD-10-CM | POA: Diagnosis not present

## 2017-03-09 DIAGNOSIS — I872 Venous insufficiency (chronic) (peripheral): Secondary | ICD-10-CM | POA: Diagnosis not present

## 2017-03-09 DIAGNOSIS — E1122 Type 2 diabetes mellitus with diabetic chronic kidney disease: Secondary | ICD-10-CM | POA: Diagnosis not present

## 2017-03-09 DIAGNOSIS — I4892 Unspecified atrial flutter: Secondary | ICD-10-CM | POA: Diagnosis not present

## 2017-03-09 DIAGNOSIS — J449 Chronic obstructive pulmonary disease, unspecified: Secondary | ICD-10-CM | POA: Diagnosis not present

## 2017-03-09 DIAGNOSIS — M519 Unspecified thoracic, thoracolumbar and lumbosacral intervertebral disc disorder: Secondary | ICD-10-CM

## 2017-03-09 DIAGNOSIS — I130 Hypertensive heart and chronic kidney disease with heart failure and stage 1 through stage 4 chronic kidney disease, or unspecified chronic kidney disease: Secondary | ICD-10-CM | POA: Diagnosis not present

## 2017-03-09 DIAGNOSIS — N183 Chronic kidney disease, stage 3 (moderate): Secondary | ICD-10-CM | POA: Diagnosis not present

## 2017-03-09 DIAGNOSIS — I5032 Chronic diastolic (congestive) heart failure: Secondary | ICD-10-CM | POA: Diagnosis not present

## 2017-03-09 DIAGNOSIS — Z23 Encounter for immunization: Secondary | ICD-10-CM | POA: Diagnosis not present

## 2017-03-09 DIAGNOSIS — I2510 Atherosclerotic heart disease of native coronary artery without angina pectoris: Secondary | ICD-10-CM | POA: Diagnosis not present

## 2017-03-09 MED ORDER — TRAMADOL HCL 50 MG PO TABS: 50.0000 mg | ORAL_TABLET | Freq: Every day | ORAL | 0 refills | Status: DC | PRN

## 2017-03-09 MED ORDER — CAPSAICIN-MENTHOL-METHYL SAL 0.025-1-12 % EX CREA: 1.0000 mg | TOPICAL_CREAM | Freq: Four times a day (QID) | CUTANEOUS | 2 refills | Status: DC

## 2017-03-09 NOTE — Assessment & Plan Note (Signed)
Patient with chronic low back pain 2/2 spondylosis and lumbar facet disease usually well controlled on PRN OTCs, presenting with exacerbation of pain with radiation to bil LEs. Exam reveals mild spinal and paraspinal tenderness; exam and symptoms consistent with OA flare with features of nerve entrapment 2/2 exacerbation of pain on extension.  Pain is somewhat amenable to OTC Tylenol.  Plan: --Tylenol  q6hrs prn; advised not to exceed  total daily dose --advised use of heat/ice, capsaicin cream --advised to continue stretches provided by PT 3 times daily if able --tramadol  daily PRN if pain not manageable; #10 script provided; advised on sedation, dizziness SE --f/u in 2 weeks or sooner if pain persist or symptoms worsen

## 2017-03-09 NOTE — Patient Instructions (Signed)
For your back pain, take tylenol every 6 hours as needed; make sure not to take more than  in 24 hours.   Apply Capsacin or similar to your back; use heat on your back and bottom to help relax the muscles.   Continue doing the stretches physical therapy has shown you; do them 3 times a day if you can.  If your pain worsens or is not improved in 2 weeks, please come back for re-evauation.

## 2017-03-09 NOTE — Progress Notes (Signed)
   CC: back pain  HPI:  [name] is a 76 y.o. with a PMH of chronic low back pain, T2DM, HTN, atrial flutter, dCHF, CKD stage 2 presenting to clinic for back pain.  Patient with history of mild chronic low back pain well controlled on occasional OTCs, now with one week history of worse back pain with bil radiation through posterior thighs to her knees. Her pain is exacerbated with extension, with sitting and walking. She endorses leaning forward slightly when walking due to pain. She denies trauma, falls, previous similar episodes, numbness/tingling, bladder or bowel incontinence, fevers, chills. She has taken Tylenol prior to her appointment today which she states is helping relieve some of the pain. Her PT has already advised her on stretching exercises; she has tried ice without relief. She has no personal history of cancer. She lives with her husband (blind) and daughter, who is always available per patient.  Please see problem based Assessment and Plan for status of patients chronic conditions.  Past Medical History:  Diagnosis Date  . Atrial flutter with rapid ventricular response (HCC) 07/11/2016  . CAD (coronary artery disease) 05/04/2011  . Diabetes mellitus without complication (HCC)   . Diastolic heart failure (HCC) 06/2016   EF 60-65%, G2DD  . Hyperlipidemia   . Hypertension   . Lumbar disc disease   . Morbid Obesity 05/02/2011  . Non-ST elevation myocardial infarction (NSTEMI), initial episode of care (HCC) 05/02/2011    Review of Systems:   ROS Per HPI  Physical Exam:  Vitals:   03/09/17 1451  BP: (!) 164/73  Pulse: 98  Temp: 98.1 F (36.7 C)  TempSrc: Oral  SpO2: 98%  Weight: (!) 315 lb 11.2 oz (143.2 kg)  Height: 5' 5.5" (1.664 m)   GENERAL- alert, co-operative, appears as stated age, not in any distress. HEENT- Atraumatic, normocephalic CARDIAC- RRR, no murmurs, rubs or gallops. RESP- Moving equal volumes of air, and clear to auscultation  bilaterally, no wheezes or crackles. NEURO- No obvious Cr N abnormality. MSK - minimal lumbar spine and paraspinal tenderness, LE strength and sensation intact; unable to fully examine hip ROM and straight leg raise test due to patient being in too much discomfort to move to examining table. Dependent edema in bil LEs. PSYCH- Normal mood and affect, appropriate thought content and speech.  Assessment & Plan:   See Encounters Tab for problem based charting.   Patient discussed with Dr. Ellamae Sia, MD Internal Medicine PGY2

## 2017-03-10 NOTE — Progress Notes (Signed)
Internal Medicine Clinic Attending  Case discussed with Dr. Svalina  at the time of the visit.  We reviewed the resident's history and exam and pertinent patient test results.  I agree with the assessment, diagnosis, and plan of care documented in the resident's note.  Alexander N Raines, MD   

## 2017-03-14 DIAGNOSIS — I872 Venous insufficiency (chronic) (peripheral): Secondary | ICD-10-CM | POA: Diagnosis not present

## 2017-03-14 DIAGNOSIS — I4891 Unspecified atrial fibrillation: Secondary | ICD-10-CM | POA: Diagnosis not present

## 2017-03-14 DIAGNOSIS — I4892 Unspecified atrial flutter: Secondary | ICD-10-CM | POA: Diagnosis not present

## 2017-03-14 DIAGNOSIS — I130 Hypertensive heart and chronic kidney disease with heart failure and stage 1 through stage 4 chronic kidney disease, or unspecified chronic kidney disease: Secondary | ICD-10-CM | POA: Diagnosis not present

## 2017-03-14 DIAGNOSIS — I2510 Atherosclerotic heart disease of native coronary artery without angina pectoris: Secondary | ICD-10-CM | POA: Diagnosis not present

## 2017-03-14 DIAGNOSIS — E1122 Type 2 diabetes mellitus with diabetic chronic kidney disease: Secondary | ICD-10-CM | POA: Diagnosis not present

## 2017-03-14 DIAGNOSIS — I5032 Chronic diastolic (congestive) heart failure: Secondary | ICD-10-CM | POA: Diagnosis not present

## 2017-03-14 DIAGNOSIS — J449 Chronic obstructive pulmonary disease, unspecified: Secondary | ICD-10-CM | POA: Diagnosis not present

## 2017-03-14 DIAGNOSIS — N183 Chronic kidney disease, stage 3 (moderate): Secondary | ICD-10-CM | POA: Diagnosis not present

## 2017-03-15 DIAGNOSIS — I4891 Unspecified atrial fibrillation: Secondary | ICD-10-CM | POA: Diagnosis not present

## 2017-03-15 DIAGNOSIS — I4892 Unspecified atrial flutter: Secondary | ICD-10-CM | POA: Diagnosis not present

## 2017-03-15 DIAGNOSIS — I130 Hypertensive heart and chronic kidney disease with heart failure and stage 1 through stage 4 chronic kidney disease, or unspecified chronic kidney disease: Secondary | ICD-10-CM | POA: Diagnosis not present

## 2017-03-15 DIAGNOSIS — I872 Venous insufficiency (chronic) (peripheral): Secondary | ICD-10-CM | POA: Diagnosis not present

## 2017-03-15 DIAGNOSIS — N183 Chronic kidney disease, stage 3 (moderate): Secondary | ICD-10-CM | POA: Diagnosis not present

## 2017-03-15 DIAGNOSIS — I2510 Atherosclerotic heart disease of native coronary artery without angina pectoris: Secondary | ICD-10-CM | POA: Diagnosis not present

## 2017-03-15 DIAGNOSIS — I5032 Chronic diastolic (congestive) heart failure: Secondary | ICD-10-CM | POA: Diagnosis not present

## 2017-03-15 DIAGNOSIS — E1122 Type 2 diabetes mellitus with diabetic chronic kidney disease: Secondary | ICD-10-CM | POA: Diagnosis not present

## 2017-03-15 DIAGNOSIS — J449 Chronic obstructive pulmonary disease, unspecified: Secondary | ICD-10-CM | POA: Diagnosis not present

## 2017-03-16 ENCOUNTER — Encounter: Payer: Medicare HMO | Admitting: Vascular Surgery

## 2017-03-16 ENCOUNTER — Encounter (HOSPITAL_COMMUNITY): Payer: Medicare HMO

## 2017-03-17 ENCOUNTER — Other Ambulatory Visit: Payer: Self-pay | Admitting: Pharmacist

## 2017-03-17 NOTE — Patient Outreach (Signed)
[facility]) Care Management  03/17/2017  Jamie Mason 03/15/1941 [id number]  Unsuccessful outreach to patient to follow-up on her patient assistance application for [employer].   Voicemail was full and not accepting new messages.   Patient returned call---HIPAA details verified.  Patient reports she has not received anything in mail from [employer] patient assistance.  Discussed with patient that she will need to watch her mail for correspondence from [employer] patient assistance she may need to complete an appeal form and mail back to [employer] patient assistance.   Phone call to [facility] patient assistance---representative reports application was not received by them yet.   Plan:  Continue to follow-up with patient regarding [employer] patient assistance.   [name], PharmD, [facility] Clinical Pharmacist [facility] [phone]

## 2017-03-20 DIAGNOSIS — J449 Chronic obstructive pulmonary disease, unspecified: Secondary | ICD-10-CM | POA: Diagnosis not present

## 2017-03-20 DIAGNOSIS — E1122 Type 2 diabetes mellitus with diabetic chronic kidney disease: Secondary | ICD-10-CM | POA: Diagnosis not present

## 2017-03-20 DIAGNOSIS — I130 Hypertensive heart and chronic kidney disease with heart failure and stage 1 through stage 4 chronic kidney disease, or unspecified chronic kidney disease: Secondary | ICD-10-CM | POA: Diagnosis not present

## 2017-03-20 DIAGNOSIS — I4891 Unspecified atrial fibrillation: Secondary | ICD-10-CM | POA: Diagnosis not present

## 2017-03-20 DIAGNOSIS — I872 Venous insufficiency (chronic) (peripheral): Secondary | ICD-10-CM | POA: Diagnosis not present

## 2017-03-20 DIAGNOSIS — I2510 Atherosclerotic heart disease of native coronary artery without angina pectoris: Secondary | ICD-10-CM | POA: Diagnosis not present

## 2017-03-20 DIAGNOSIS — I4892 Unspecified atrial flutter: Secondary | ICD-10-CM | POA: Diagnosis not present

## 2017-03-20 DIAGNOSIS — I5032 Chronic diastolic (congestive) heart failure: Secondary | ICD-10-CM | POA: Diagnosis not present

## 2017-03-20 DIAGNOSIS — N183 Chronic kidney disease, stage 3 (moderate): Secondary | ICD-10-CM | POA: Diagnosis not present

## 2017-03-21 ENCOUNTER — Telehealth: Payer: Self-pay

## 2017-03-21 NOTE — Telephone Encounter (Signed)
Faxed [facility] home [facility] form 03/21/2017.

## 2017-03-23 ENCOUNTER — Other Ambulatory Visit: Payer: Self-pay | Admitting: Pharmacist

## 2017-03-23 ENCOUNTER — Other Ambulatory Visit: Payer: Self-pay | Admitting: *Deleted

## 2017-03-23 DIAGNOSIS — E1122 Type 2 diabetes mellitus with diabetic chronic kidney disease: Secondary | ICD-10-CM | POA: Diagnosis not present

## 2017-03-23 DIAGNOSIS — I4891 Unspecified atrial fibrillation: Secondary | ICD-10-CM | POA: Diagnosis not present

## 2017-03-23 DIAGNOSIS — N183 Chronic kidney disease, stage 3 (moderate): Secondary | ICD-10-CM | POA: Diagnosis not present

## 2017-03-23 DIAGNOSIS — I4892 Unspecified atrial flutter: Secondary | ICD-10-CM | POA: Diagnosis not present

## 2017-03-23 DIAGNOSIS — I872 Venous insufficiency (chronic) (peripheral): Secondary | ICD-10-CM | POA: Diagnosis not present

## 2017-03-23 DIAGNOSIS — I130 Hypertensive heart and chronic kidney disease with heart failure and stage 1 through stage 4 chronic kidney disease, or unspecified chronic kidney disease: Secondary | ICD-10-CM | POA: Diagnosis not present

## 2017-03-23 DIAGNOSIS — J449 Chronic obstructive pulmonary disease, unspecified: Secondary | ICD-10-CM | POA: Diagnosis not present

## 2017-03-23 DIAGNOSIS — I2510 Atherosclerotic heart disease of native coronary artery without angina pectoris: Secondary | ICD-10-CM | POA: Diagnosis not present

## 2017-03-23 DIAGNOSIS — I5032 Chronic diastolic (congestive) heart failure: Secondary | ICD-10-CM | POA: Diagnosis not present

## 2017-03-23 NOTE — Patient Outreach (Signed)
[facility]) Care Management  03/23/2017  Jamie Mason [birth year] [id number]   Incoming call from patient.  Patient reports she received [employer] patient assistance application back in mail with letter saying it was incomplete due to income.  Patient apparently only wrote monthly income, not yearly.  Counseled patient to write in yearly income.    Patient states application did not come back with an attestation form.  Patient reports she does have prescriber portion of application that was mailed back too.   Patient counseled to mail back original application to [employer] patient assistance and she reports she will mail back today.   Plan:  Continue to follow-up with patient regarding [employer] patient assistance.    [name], PharmD, [facility] Clinical Pharmacist [facility] [phone]

## 2017-03-23 NOTE — Patient Outreach (Signed)
[facility]) Care Management   03/23/2017  Jamie Mason August 26, 1940 [id number]  Jamie Mason is an 76 y.o. female  Subjective:   Member alert and oriented x3, denies chest discomfort or shortness of breath at this time.  Report she is compliant with all medications and daily weights, but does not write them down.  Discussed the importance of writing daily weights in effort to monitor trends accurately.  State she is pleased with the current weight loss, state she would love to lose more.  Objective:   Review of Systems  Constitutional: Negative.   HENT: Negative.   Eyes: Negative.   Respiratory: Negative.   Cardiovascular: Positive for leg swelling.  Gastrointestinal: Negative.   Genitourinary: Negative.   Musculoskeletal: Negative.   Skin: Negative.   Neurological: Negative.   Endo/Heme/Allergies: Negative.   Psychiatric/Behavioral: Negative.     Physical Exam  Constitutional: She is oriented to person, place, and time. She appears well-developed and well-nourished.  Neck: Normal range of motion.  Cardiovascular: Normal rate, regular rhythm and normal heart sounds.   Respiratory: Effort normal and breath sounds normal.  GI: Soft. Bowel sounds are normal.  Musculoskeletal: Normal range of motion.  Neurological: She is alert and oriented to person, place, and time.  Skin: Skin is warm and dry.   BP (!) 142/72   Pulse 95   Ht 1.651 m ('5\' 5"' )   Wt (!) 314 lb (142.4 kg)   SpO2 98%   BMI 52.25 kg/m   Encounter Medications:   Outpatient Encounter Prescriptions as of 03/23/2017  Medication Sig Note  . albuterol (PROVENTIL HFA;VENTOLIN HFA) 108 (90 Base) MCG/ACT inhaler Inhale 2 puffs into the lungs every 6 (six) hours as needed for wheezing or shortness of breath.   Marland Kitchen amiodarone (PACERONE) 200 MG tablet Take 1 tablet (200 mg total) by mouth daily.   Marland Kitchen apixaban (ELIQUIS) 5 MG TABS tablet Take 1 tablet (5 mg total) by mouth 2 (two) times daily.  02/07/2017: Pt states now taking it bid  . aspirin (ASPIR-LOW) 81 MG EC tablet Take 81 mg by mouth daily. Swallow whole.   Marland Kitchen atorvastatin (LIPITOR) 40 MG tablet Take 1 tablet (40 mg total) by mouth daily.   . Capsaicin-Menthol-Methyl Sal (CAPSAICIN-METHYL SAL-MENTHOL) 0.025-1-12 % CREA Apply 1 mg topically 4 (four) times daily.   . carvedilol (COREG) 25 MG tablet Take 1 tablet (25 mg total) by mouth 2 (two) times daily with a meal.   . furosemide (LASIX) 40 MG tablet Take 1 tablet (40 mg total) by mouth 2 (two) times daily. Take 2 tablets by mouth every morning and 1 tablet by mouth every afternoon (Patient taking differently: Take 40 mg by mouth 2 (two) times daily. ) 11/18/2016: Patient reports taking 40 mg tabs---two tabs twice daily.    . hydrALAZINE (APRESOLINE) 50 MG tablet Take 1 tablet (50 mg total) by mouth 3 (three) times daily.   . hydrocerin (EUCERIN) CREA Apply to bilateral LEs after cleansing on Mondays and Thursdays (prior to [employer] application).   . metFORMIN (GLUCOPHAGE) 500 MG tablet Take 1 tablet (500 mg total) by mouth 2 (two) times daily with a meal.   . polyethylene glycol powder (GLYCOLAX/MIRALAX) powder MIX 1 CAPFUL (17GM) IN LIQUID AND DRINK EVERY DAY   . diltiazem (CARDIZEM CD) 360 MG 24 hr capsule Take 1 capsule (360 mg total) by mouth daily.   . traMADol (ULTRAM) 50 MG tablet Take 1 tablet (50 mg total) by mouth daily  as needed. (Patient not taking: Reported on 03/23/2017)    No facility-administered encounter medications on file as of 03/23/2017.     Functional Status:   In your present state of health, do you have any difficulty performing the following activities: 03/09/2017 02/02/2017  Hearing? N N  Vision? N N  Difficulty concentrating or making decisions? N N  Walking or climbing stairs? Y Y  Dressing or bathing? Y Y  Doing errands, shopping? Y Y  Some recent data might be hidden    Fall/Depression Screening:    Fall Risk  03/09/2017 02/02/2017 12/23/2016   Falls in the past year? No No No  Number falls in past yr: - - -  Injury with Fall? - - -  Risk Factor Category  - - High Fall Risk  Risk for fall due to : - - -  Risk for fall due to: Comment - - -  Follow up - - -   PHQ 2/9 Scores 03/09/2017 02/02/2017 12/23/2016 12/01/2016 11/15/2016 11/11/2016 10/19/2016  PHQ - 2 Score 0 0 0 0 0 0 0    Assessment:    Met with member at scheduled time.  She state she is happy to be out of the hospital as long as she has (almost 2 months). She has had multiple hospital admissions this year. She state she feel like she has improved since the beginning of the year.  "I think I'm on my way back up."    She report compliance with medications, however unable to review medication bottles as she does not have them available at this time (in another part of the home).  Discussed the possibility of using pill box or prepackaged directly from pharmacy.  She declines, stating she feel she is doing a good job taking directly from bottles.  Discussed member's improvement in health, possibility of transitioning to health coach.  She has appointment with vascular MD within the next couple weeks to assess circulation in legs.  Request to wait until an assessment and plan of care is developed prior to transition.    She denies any urgent concerns at this time.  Advised to contact with questions.  Plan:   Will follow up telephonically next month.  If health remain stable, will transition to health coach.  [facility] CM Care Plan Problem One     Most Recent Value  Care Plan Problem One  Risk for hospital admission related to heart failure as evidenced by recent hospitalization  Role Documenting the Problem One  Care Management [place] for Problem One  Not Active  [facility] Long Term Goal   Member will not be readmitted to hospital within the next 31 days of discharge  [facility] Long Term Goal Start Date  01/30/17  [facility] Long Term Goal Met Date  03/03/17  Interventions for Problem  One Long Term Goal  Discussed with member the importance of following discharge instructions, including follow up appointments, medications, diet, and compliance with home health involvement, to decrease the risk of readmission  [facility] CM Short Term Goal #1   Member will take medications as prescribed for the next 4 weeks  [facility] CM Short Term Goal #1 Start Date  01/30/17  [facility] CM Short Term Goal #1 Met Date  02/23/17  Interventions for Short Term Goal #1  Educated on the importance of taking medications as prescribed.  Medication list reviewed as new medications were prescribed.  [facility] CM Short Term Goal #2   Member will keep  and attend follow up appointment within the next 2 weeks  [facility] CM Short Term Goal #2 Start Date  01/30/17  [facility] CM Short Term Goal #2 Met Date  02/16/17  Interventions for Short Term Goal #2  Educated on importance of attending follow up appointment todecrease risk of readmission.  Confirmed member has appointment scheduled, confirmed member has transportation (daughter will take her).    [facility] CM Care Plan Problem Two     Most Recent Value  Care Plan Problem Two  Decreased mobility related to lower extremity edema  Role Documenting the Problem Two  Care Management Coordinator  Care Plan for Problem Two  Active  Interventions for Problem Two Long Term Goal   Re-educated on proper low sodium diet, reminded of handouts givent to member regarding reading food labels   [place]  Member will be able to verbalize understanding of low salt diet within the next 31 days  [facility] Long Term Goal Start Date  03/23/17  [facility] CM Short Term Goal #1   Member will keep and attend appointment with vascular doctor and for lower exteremity doppler studies  [facility] CM Short Term Goal #1 Start Date  03/23/17  Interventions for Short Term Goal #2   Reminded member of appointments scheduled for within the next 2 weeks.  Confirmed she has transportation.  Discussed importance of keeping and attending  appointment.     [facility] CM Short Term Goal #2   Member will continue to weigh daily, reporting a decrease in weight by 5-10 pounds within the next month  [facility] CM Short Term Goal #2 Start Date  03/23/17  Interventions for Short Term Goal #2  Educated on the importance of daily weights and how decreasing overall weight will aid in decreasing the amount of edema in lower extremities      Valente David, RN, MSN [place] Manager [phone]

## 2017-03-24 DIAGNOSIS — I2510 Atherosclerotic heart disease of native coronary artery without angina pectoris: Secondary | ICD-10-CM | POA: Diagnosis not present

## 2017-03-24 DIAGNOSIS — N183 Chronic kidney disease, stage 3 (moderate): Secondary | ICD-10-CM | POA: Diagnosis not present

## 2017-03-24 DIAGNOSIS — J449 Chronic obstructive pulmonary disease, unspecified: Secondary | ICD-10-CM | POA: Diagnosis not present

## 2017-03-24 DIAGNOSIS — I872 Venous insufficiency (chronic) (peripheral): Secondary | ICD-10-CM | POA: Diagnosis not present

## 2017-03-24 DIAGNOSIS — I5032 Chronic diastolic (congestive) heart failure: Secondary | ICD-10-CM | POA: Diagnosis not present

## 2017-03-24 DIAGNOSIS — E1122 Type 2 diabetes mellitus with diabetic chronic kidney disease: Secondary | ICD-10-CM | POA: Diagnosis not present

## 2017-03-24 DIAGNOSIS — I130 Hypertensive heart and chronic kidney disease with heart failure and stage 1 through stage 4 chronic kidney disease, or unspecified chronic kidney disease: Secondary | ICD-10-CM | POA: Diagnosis not present

## 2017-03-24 DIAGNOSIS — I4891 Unspecified atrial fibrillation: Secondary | ICD-10-CM | POA: Diagnosis not present

## 2017-03-24 DIAGNOSIS — I4892 Unspecified atrial flutter: Secondary | ICD-10-CM | POA: Diagnosis not present

## 2017-03-29 DIAGNOSIS — I4892 Unspecified atrial flutter: Secondary | ICD-10-CM | POA: Diagnosis not present

## 2017-03-29 DIAGNOSIS — I130 Hypertensive heart and chronic kidney disease with heart failure and stage 1 through stage 4 chronic kidney disease, or unspecified chronic kidney disease: Secondary | ICD-10-CM | POA: Diagnosis not present

## 2017-03-29 DIAGNOSIS — I4891 Unspecified atrial fibrillation: Secondary | ICD-10-CM | POA: Diagnosis not present

## 2017-03-29 DIAGNOSIS — I872 Venous insufficiency (chronic) (peripheral): Secondary | ICD-10-CM | POA: Diagnosis not present

## 2017-03-29 DIAGNOSIS — I5032 Chronic diastolic (congestive) heart failure: Secondary | ICD-10-CM | POA: Diagnosis not present

## 2017-03-29 DIAGNOSIS — J449 Chronic obstructive pulmonary disease, unspecified: Secondary | ICD-10-CM | POA: Diagnosis not present

## 2017-03-29 DIAGNOSIS — E1122 Type 2 diabetes mellitus with diabetic chronic kidney disease: Secondary | ICD-10-CM | POA: Diagnosis not present

## 2017-03-29 DIAGNOSIS — I2510 Atherosclerotic heart disease of native coronary artery without angina pectoris: Secondary | ICD-10-CM | POA: Diagnosis not present

## 2017-03-29 DIAGNOSIS — N183 Chronic kidney disease, stage 3 (moderate): Secondary | ICD-10-CM | POA: Diagnosis not present

## 2017-03-31 ENCOUNTER — Telehealth: Payer: Self-pay

## 2017-03-31 NOTE — Telephone Encounter (Signed)
Axed [facility] form for CHF protocol @ [phone] on 03/30/2017.

## 2017-04-03 ENCOUNTER — Other Ambulatory Visit: Payer: Self-pay | Admitting: Pharmacist

## 2017-04-03 NOTE — Patient Outreach (Signed)
[facility]) Care Management  04/03/2017  Arvella NighGloria C Sitts 05/16/1941 [id number]  Follow-up call to [employer] patient assistance regarding patient's application for albuterol assistance.   Representative reports application and appeals form were mailed back to patient 03/30/17.    Successful phone outreach to patient, HIPAA details verified.  Updated patient appeals form and application from [employer] patient assistance were mailed back to her.  She reports she has not received yet.  She was counseled she will need to complete appeals form and return to [employer] patient assistance with her completed application.    Plan:  Continue to follow-up with patient regarding [employer] patient assistance.   Tommye StandardKevin Lior Cartelli, PharmD, [facility] Hospital[facility]ACP Clinical Pharmacist [facility] [phone]

## 2017-04-05 DIAGNOSIS — E1122 Type 2 diabetes mellitus with diabetic chronic kidney disease: Secondary | ICD-10-CM | POA: Diagnosis not present

## 2017-04-05 DIAGNOSIS — I5032 Chronic diastolic (congestive) heart failure: Secondary | ICD-10-CM | POA: Diagnosis not present

## 2017-04-05 DIAGNOSIS — I4891 Unspecified atrial fibrillation: Secondary | ICD-10-CM | POA: Diagnosis not present

## 2017-04-05 DIAGNOSIS — I4892 Unspecified atrial flutter: Secondary | ICD-10-CM | POA: Diagnosis not present

## 2017-04-05 DIAGNOSIS — I130 Hypertensive heart and chronic kidney disease with heart failure and stage 1 through stage 4 chronic kidney disease, or unspecified chronic kidney disease: Secondary | ICD-10-CM | POA: Diagnosis not present

## 2017-04-05 DIAGNOSIS — N183 Chronic kidney disease, stage 3 (moderate): Secondary | ICD-10-CM | POA: Diagnosis not present

## 2017-04-05 DIAGNOSIS — I872 Venous insufficiency (chronic) (peripheral): Secondary | ICD-10-CM | POA: Diagnosis not present

## 2017-04-05 DIAGNOSIS — J449 Chronic obstructive pulmonary disease, unspecified: Secondary | ICD-10-CM | POA: Diagnosis not present

## 2017-04-05 DIAGNOSIS — I2510 Atherosclerotic heart disease of native coronary artery without angina pectoris: Secondary | ICD-10-CM | POA: Diagnosis not present

## 2017-04-06 ENCOUNTER — Encounter: Payer: Self-pay | Admitting: Vascular Surgery

## 2017-04-06 ENCOUNTER — Ambulatory Visit (HOSPITAL_COMMUNITY)
Admission: RE | Admit: 2017-04-06 | Discharge: 2017-04-06 | Disposition: A | Discharge status: Home / Self Care | Payer: Medicare HMO | Source: Ambulatory Visit | Attending: Vascular Surgery | Admitting: Vascular Surgery

## 2017-04-06 ENCOUNTER — Ambulatory Visit (INDEPENDENT_AMBULATORY_CARE_PROVIDER_SITE_OTHER): Payer: Medicare HMO | Admitting: Vascular Surgery

## 2017-04-06 VITALS — BP 160/70 | HR 93 | Temp 97.4°F | Resp 18 | Ht 65.0 in | Wt 300.0 lb

## 2017-04-06 DIAGNOSIS — R609 Edema, unspecified: Secondary | ICD-10-CM | POA: Diagnosis not present

## 2017-04-06 DIAGNOSIS — I872 Venous insufficiency (chronic) (peripheral): Secondary | ICD-10-CM | POA: Diagnosis not present

## 2017-04-06 NOTE — Progress Notes (Signed)
Patient name: Jamie Mason MRN: [id number] DOB: 1940/07/08 Sex: female   REASON FOR CONSULT:    Bilateral lower extremity edema with chronic venous insufficiency.  The consult is requested by Dr. Rogelia Boga.  HPI:   Jamie Mason is a pleasant 76 y.o. female, who presents with bilateral lower extremity swelling.  He tells me that she has had swelling in both legs for many months but this has gradually gotten worse.  She is unaware of any previous history of DVT.  Her activity is very limited because of her weight.  I do not get any history of claudication, rest pain, or nonhealing ulcers.  I did review the last note from the [facility] internal medicine and Center.  Patient was seen on 03/09/2017.  She has multiple medical issues including chronic low back pain, type 2 diabetes, hypertension, atrial flutter, diastolic congestive heart failure, and stage II chronic kidney disease.  Past Medical History:  Diagnosis Date  . Atrial flutter with rapid ventricular response (HCC) 07/11/2016  . CAD (coronary artery disease) 05/04/2011  . Diabetes mellitus without complication (HCC)   . Diastolic heart failure (HCC) 06/2016   EF 60-65%, G2DD  . Hyperlipidemia   . Hypertension   . Lumbar disc disease   . Morbid Obesity 05/02/2011  . Non-ST elevation myocardial infarction (NSTEMI), initial episode of care [facility]) 05/02/2011    Family History  Problem Relation Age of Onset  . Cancer Father   . Stroke Mother     SOCIAL HISTORY: Social History   Social History  . Marital status: Married    Spouse name: N/A  . Number of children: N/A  . Years of education: 75   Occupational History  .  Unemployed   Social History Main Topics  . Smoking status: Former Smoker    Packs/day: 0.50    Years: 50.00    Types: Cigarettes    Quit date: 04/01/2011  . Smokeless tobacco: Never Used  . Alcohol use No  . Drug use: No  . Sexual activity: No   Other Topics Concern  . Not on file   Social  History Narrative   June 2016 : married. Lives with husband (blind) and adult daughter. 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 Independent in ADL's. Doesn't go to stores / church 2/2 pain / trouble walking. Cold [place] tobacco about 2011.    Allergies  Allergen Reactions  . Lisinopril Swelling    Tongue swelling   . Ace Inhibitors Other (See Comments)    unknown  . Codeine Itching and Rash  . Penicillins Itching and Rash    Has patient had a PCN reaction causing immediate rash, facial/tongue/throat swelling, SOB or lightheadedness with hypotension: unknown Has patient had a PCN reaction causing severe rash involving mucus membranes or skin necrosis: unknown Has patient had a PCN reaction that required hospitalization: no Has patient had a PCN reaction occurring within the last 10 years: no If all of the above answers are "NO", then may proceed with Cephalosporin use.  . Tomato     Itchy rash    Current Outpatient Prescriptions  Medication Sig Dispense Refill  . albuterol (PROVENTIL HFA;VENTOLIN HFA) 108 (90 Base) MCG/ACT inhaler Inhale 2 puffs into the lungs every 6 (six) hours as needed for wheezing or shortness of breath. 1 Inhaler 3  . amiodarone (PACERONE) 200 MG tablet Take 1 tablet (200 mg total) by mouth daily. 90 tablet 3  .  apixaban (ELIQUIS) 5 MG TABS tablet Take 1 tablet (5 mg total) by mouth 2 (two) times daily. 180 tablet 3  . aspirin (ASPIR-LOW) 81 MG EC tablet Take 81 mg by mouth daily. Swallow whole.    Marland Kitchen atorvastatin (LIPITOR) 40 MG tablet Take 1 tablet (40 mg total) by mouth daily. 90 tablet 3  . carvedilol (COREG) 25 MG tablet Take 1 tablet (25 mg total) by mouth 2 (two) times daily with a meal. 90 tablet 3  . furosemide (LASIX) 40 MG tablet Take 1 tablet (40 mg total) by mouth 2 (two) times daily. Take 2 tablets by mouth every morning and 1 tablet by mouth every afternoon (Patient taking differently: Take 40 mg by mouth 2 (two) times  daily. ) 120 tablet 2  . hydrALAZINE (APRESOLINE) 50 MG tablet Take 1 tablet (50 mg total) by mouth 3 (three) times daily. 90 tablet 2  . hydrocerin (EUCERIN) CREA Apply to bilateral LEs after cleansing on Mondays and Thursdays (prior to [employer] application). 113 g 0  . metFORMIN (GLUCOPHAGE) 500 MG tablet Take 1 tablet (500 mg total) by mouth 2 (two) times daily with a meal. 180 tablet 3  . Capsaicin-Menthol-Methyl Sal (CAPSAICIN-METHYL SAL-MENTHOL) 0.025-1-12 % CREA Apply 1 mg topically 4 (four) times daily. (Patient not taking: Reported on 04/06/2017) 1 Tube 2  . diltiazem (CARDIZEM CD) 360 MG 24 hr capsule Take 1 capsule (360 mg total) by mouth daily. 90 capsule 3  . polyethylene glycol powder (GLYCOLAX/MIRALAX) powder MIX 1 CAPFUL (17GM) IN LIQUID AND DRINK EVERY DAY (Patient not taking: Reported on 04/06/2017) 1530 g 2  . traMADol (ULTRAM) 50 MG tablet Take 1 tablet (50 mg total) by mouth daily as needed. 10 tablet 0   No current facility-administered medications for this visit.     REVIEW OF SYSTEMS:  [X]  denotes positive finding, [ ]  denotes negative finding Cardiac  Comments:  Chest pain or chest pressure:    Shortness of breath upon exertion: X   Short of breath when lying flat:    Irregular heart rhythm:        Vascular    Pain in calf, thigh, or hip brought on by ambulation: X   Pain in feet at night that wakes you up from your sleep:     Blood clot in your veins:    Leg swelling:  X       Pulmonary    Oxygen at home:    Productive cough:     Wheezing:  X       Neurologic    Sudden weakness in arms or legs:     Sudden numbness in arms or legs:     Sudden onset of difficulty speaking or slurred speech:    Temporary loss of vision in one eye:     Problems with dizziness:         Gastrointestinal    Blood in stool:     Vomited blood:         Genitourinary    Burning when urinating:     Blood in urine:        Psychiatric    Major depression:           Hematologic    Bleeding problems:    Problems with blood clotting too easily:        Skin    Rashes or ulcers:        Constitutional    Fever or chills:  PHYSICAL EXAM:   Vitals:   04/06/17 1426  BP: (!) 160/70  Pulse: 93  Resp: 18  Temp: (!) 97.4 F (36.3 C)  SpO2: 100%  Weight: 300 lb (136.1 kg)  Height: 5\' 5"  (1.651 m)    GENERAL: The patient is a well-nourished female, in no acute distress. The vital signs are documented above. CARDIAC: There is a regular rate and rhythm.  VASCULAR: I do not detect carotid bruits. I cannot palpate femoral pulses because of her size.  I cannot palpate pedal pulses. However, she has brisk dorsalis pedis and posterior tibial signals with the Doppler bilaterally. She has significant edema bilaterally. She has hyperpigmentation bilaterally consistent with chronic venous insufficiency. PULMONARY: There is good air exchange bilaterally without wheezing or rales. ABDOMEN: Soft and non-tender with normal pitched bowel sounds.  MUSCULOSKELETAL: There are no major deformities or cyanosis. NEUROLOGIC: No focal weakness or paresthesias are detected. SKIN: There are no ulcers or rashes noted. PSYCHIATRIC: The patient has a normal affect.  DATA:    BILATERAL LOWER EXTREMITY VENOUS DUPLEX: I have independently interpreted her bilateral lower extremity venous duplex scan.  On the right side there is no evidence of DVT or superficial thrombophlebitis.  There is no deep venous reflux and no superficial venous reflux.  On the left side there is no evidence of DVT or superficial thrombophlebitis.  There is no evidence of deep vein reflux or superficial venous reflux.  MEDICAL ISSUES:   BILATERAL LOWER EXTREMITY EDEMA: I think her bilateral lower extremity edema is multifactorial.  I think she has lipidemia, some element of chronic venous insufficiency, and some element of lymphedema.  Although her noninvasive study did not show any obvious reflux,  given her size I think the study was limited technically and on exam she clearly has hyperpigmentation consistent with chronic venous insufficiency.  Clearly weight loss would be helpful for the lipidemia.  With respect to the chronic venous insufficiency and lymphedema we have discussed the importance of leg elevation and the proper positioning for this.  In addition I have given her a prescription for a knee-high compression stocking with a mild gradient.  I do not think she would be able to get on a tighter stocking.  I have encouraged her to avoid prolonged sitting and standing.  I have encouraged her to exercise.  I think she would be an excellent candidate for water aerobics.  This would be a way to get some good exercise without affecting her back which has significant issues.  I think this is also very helpful for patients with venous insufficiency.  I plan on seeing her back in 6 months.  I think we should get a baseline Doppler arterial study and this is been ordered when she returns.  She knows to call sooner if she has problems.  Waverly Ferrariickson, [facility] [facility] [phone]

## 2017-04-12 ENCOUNTER — Other Ambulatory Visit: Payer: Self-pay | Admitting: Pharmacist

## 2017-04-12 NOTE — Patient Outreach (Signed)
[facility]) Care Management  04/12/2017  Jamie NighGloria C Mason 07/08/1940 [id number]  Successful phone outreach to patient regarding patient assistance application for [employer]---HIPAA details verified. Patient reports she received application and attestation form last week, completed and returned via mail to [employer].    Phone call placed to [employer] patient assistance program, representative reports application was approved for albuterol inhaler 04/12/17 and will be sent to [facility] for prescription to be filled and shipped to patient.   Plan:  Continue to follow-up to ensure patient receives patient assistance supplied albuterol (Proventil) inhaler.   Tommye StandardKevin Aimar Shrewsbury, PharmD, [facility] HospitalBCACP Clinical Pharmacist [facility] [phone]

## 2017-04-13 DIAGNOSIS — I2510 Atherosclerotic heart disease of native coronary artery without angina pectoris: Secondary | ICD-10-CM | POA: Diagnosis not present

## 2017-04-13 DIAGNOSIS — I872 Venous insufficiency (chronic) (peripheral): Secondary | ICD-10-CM | POA: Diagnosis not present

## 2017-04-13 DIAGNOSIS — N183 Chronic kidney disease, stage 3 (moderate): Secondary | ICD-10-CM | POA: Diagnosis not present

## 2017-04-13 DIAGNOSIS — I130 Hypertensive heart and chronic kidney disease with heart failure and stage 1 through stage 4 chronic kidney disease, or unspecified chronic kidney disease: Secondary | ICD-10-CM | POA: Diagnosis not present

## 2017-04-13 DIAGNOSIS — J449 Chronic obstructive pulmonary disease, unspecified: Secondary | ICD-10-CM | POA: Diagnosis not present

## 2017-04-13 DIAGNOSIS — I4891 Unspecified atrial fibrillation: Secondary | ICD-10-CM | POA: Diagnosis not present

## 2017-04-13 DIAGNOSIS — E1122 Type 2 diabetes mellitus with diabetic chronic kidney disease: Secondary | ICD-10-CM | POA: Diagnosis not present

## 2017-04-13 DIAGNOSIS — I5032 Chronic diastolic (congestive) heart failure: Secondary | ICD-10-CM | POA: Diagnosis not present

## 2017-04-13 DIAGNOSIS — I4892 Unspecified atrial flutter: Secondary | ICD-10-CM | POA: Diagnosis not present

## 2017-04-18 ENCOUNTER — Other Ambulatory Visit: Payer: Self-pay | Admitting: *Deleted

## 2017-04-18 NOTE — Patient Outreach (Signed)
[facility]) Care Management  04/18/2017  Arvella NighGloria C Kil 09/07/1940 [id number]   Call placed to member to follow up on current health status and plan of care regarding appointment with vascular surgeon.  No answer, unable to leave a message.  Will follow up within 2 weeks.  Kemper DurieMonica Marvel Sapp, CaliforniaRN, MSN [facility]PHN Care Management  [place] [facility] Care Manager [phone]

## 2017-04-19 ENCOUNTER — Other Ambulatory Visit: Payer: Self-pay | Admitting: Pharmacist

## 2017-04-19 NOTE — Addendum Note (Signed)
Addended by: Burton ApleyPETTY, Suleima Ohlendorf A on: 04/19/2017 04:16 PM   Modules accepted: Orders

## 2017-04-19 NOTE — Patient Outreach (Signed)
[facility]) Care Management  04/19/2017  Jamie NighGloria C Mason 05/23/1941 [id number]  Follow-up call to patient, HIPAA details verified.  Patient was updated that her application for manufacturer assistance from [employer] was approved---she reports she did not receive her medication yet.   Call placed to [facility], dispensing pharmacy for [employer] patient assistance, representative reports order was "received a couple days ago" and patient should receive in the next 7-10 business days.  They did not have a ship date yet.   Called patient back and informed her on update regarding her medication.  She verbalized understanding.   Plan:  Outreach call next week to see if patient receives medication from [employer] patient assistance.   Tommye StandardKevin Pearlie Lafosse, PharmD, [facility] ChicagoBCACP Clinical Pharmacist [facility] [phone]

## 2017-04-21 DIAGNOSIS — I4892 Unspecified atrial flutter: Secondary | ICD-10-CM | POA: Diagnosis not present

## 2017-04-21 DIAGNOSIS — N183 Chronic kidney disease, stage 3 (moderate): Secondary | ICD-10-CM | POA: Diagnosis not present

## 2017-04-21 DIAGNOSIS — E1122 Type 2 diabetes mellitus with diabetic chronic kidney disease: Secondary | ICD-10-CM | POA: Diagnosis not present

## 2017-04-21 DIAGNOSIS — I2510 Atherosclerotic heart disease of native coronary artery without angina pectoris: Secondary | ICD-10-CM | POA: Diagnosis not present

## 2017-04-21 DIAGNOSIS — J449 Chronic obstructive pulmonary disease, unspecified: Secondary | ICD-10-CM | POA: Diagnosis not present

## 2017-04-21 DIAGNOSIS — I4891 Unspecified atrial fibrillation: Secondary | ICD-10-CM | POA: Diagnosis not present

## 2017-04-21 DIAGNOSIS — I5032 Chronic diastolic (congestive) heart failure: Secondary | ICD-10-CM | POA: Diagnosis not present

## 2017-04-21 DIAGNOSIS — I130 Hypertensive heart and chronic kidney disease with heart failure and stage 1 through stage 4 chronic kidney disease, or unspecified chronic kidney disease: Secondary | ICD-10-CM | POA: Diagnosis not present

## 2017-04-21 DIAGNOSIS — I872 Venous insufficiency (chronic) (peripheral): Secondary | ICD-10-CM | POA: Diagnosis not present

## 2017-04-24 ENCOUNTER — Other Ambulatory Visit: Payer: Self-pay | Admitting: *Deleted

## 2017-04-24 NOTE — Patient Outreach (Signed)
[facility]) Care Management  04/24/2017  [name] 11/14/1940 [id number]   Call placed to member to follow up on appointment with vascular surgeon and current health status, no answer.  Unable to leave a message as mailbox is full.  Will follow up next week.  Kemper DurieMonica Garan Frappier, MSN, RN [facility] Care Management  [facility] Care Manager [phone]

## 2017-04-25 ENCOUNTER — Other Ambulatory Visit: Payer: Self-pay | Admitting: *Deleted

## 2017-04-25 NOTE — Patient Outreach (Addendum)
[facility]) Care Management  04/25/2017  Jamie Mason 21-Jul-1940 [id number]   Call placed to member as voice message received after yesterday's missed call.  Member report she is "doing real good."  She report compliance with medications, remains active with telehealth component of home health, monitoring weight, blood pressure and oxygen levels daily.  She state she did attend appointment with vascular surgeon, no concern regarding blood flow, stating "I have good blood running."  Follow up next year.  Next follow up with primary MD on 11/29, member aware.  She expresses gratitude and satisfaction with remaining out of hospital since August.  This care manager inquires about transitioning to health coach as her goals for community involvement have been met.  She agrees, advised to notify health coach if needs change and she need a home visit.  She verbalizes understanding.   Will close case and refer to health coach.  [facility] CM Care Plan Problem Two     Most Recent Value  Care Plan Problem Two  Decreased mobility related to lower extremity edema  Role Documenting the Problem Two  Care Management Coordinator  Care Plan for Problem Two  Not Active  Interventions for Problem Two Long Term Goal   Re-educated on proper low sodium diet, reminded of handouts givent to member regarding reading food labels   [place]  Member will be able to verbalize understanding of low salt diet within the next 31 days  [facility] Long Term Goal Start Date  03/23/17  [facility] Long Term Goal Met Date  04/25/17  [facility] CM Short Term Goal #1   Member will keep and attend appointment with vascular doctor and for lower exteremity doppler studies  [facility] CM Short Term Goal #1 Start Date  03/23/17  [facility] CM Short Term Goal #1 Met Date   04/25/17  Interventions for Short Term Goal #2   Reminded member of appointments scheduled for within the next 2 weeks.  Confirmed she has transportation.  Discussed importance of  keeping and attending appointment.     [facility] CM Short Term Goal #2   Member will continue to weigh daily, reporting a decrease in weight by 5-10 pounds within the next month  [facility] CM Short Term Goal #2 Start Date  03/23/17  [facility] CM Short Term Goal #2 Met Date  04/25/17  Interventions for Short Term Goal #2  Educated on the importance of daily weights and how decreasing overall weight will aid in decreasing the amount of edema in lower extremities        Valente David, MSN, RN [place] Manager [phone]

## 2017-04-26 ENCOUNTER — Encounter: Payer: Self-pay | Admitting: *Deleted

## 2017-04-26 ENCOUNTER — Other Ambulatory Visit: Payer: Self-pay | Admitting: Pharmacist

## 2017-04-26 DIAGNOSIS — I2510 Atherosclerotic heart disease of native coronary artery without angina pectoris: Secondary | ICD-10-CM | POA: Diagnosis not present

## 2017-04-26 DIAGNOSIS — I4892 Unspecified atrial flutter: Secondary | ICD-10-CM | POA: Diagnosis not present

## 2017-04-26 DIAGNOSIS — N183 Chronic kidney disease, stage 3 (moderate): Secondary | ICD-10-CM | POA: Diagnosis not present

## 2017-04-26 DIAGNOSIS — I130 Hypertensive heart and chronic kidney disease with heart failure and stage 1 through stage 4 chronic kidney disease, or unspecified chronic kidney disease: Secondary | ICD-10-CM | POA: Diagnosis not present

## 2017-04-26 DIAGNOSIS — E1122 Type 2 diabetes mellitus with diabetic chronic kidney disease: Secondary | ICD-10-CM | POA: Diagnosis not present

## 2017-04-26 DIAGNOSIS — J449 Chronic obstructive pulmonary disease, unspecified: Secondary | ICD-10-CM | POA: Diagnosis not present

## 2017-04-26 DIAGNOSIS — I872 Venous insufficiency (chronic) (peripheral): Secondary | ICD-10-CM | POA: Diagnosis not present

## 2017-04-26 DIAGNOSIS — I4891 Unspecified atrial fibrillation: Secondary | ICD-10-CM | POA: Diagnosis not present

## 2017-04-26 DIAGNOSIS — I5032 Chronic diastolic (congestive) heart failure: Secondary | ICD-10-CM | POA: Diagnosis not present

## 2017-04-26 NOTE — Patient Outreach (Signed)
[facility]) Care Management  04/26/2017  Arvella NighGloria C Freeney 06/01/1941 [id number]   Referral Date: 04/26/2017 [facility] RN Kemper DurieMonica Lane Referral Reason: Ongoing education on CHF Insurance: [employer]  Plan:  RN Health Coach sent Health Coach Letter  Will follow up within the month of December  Gean MaidensFrances Terese Heier BSN RN [facility] Care Management [phone]

## 2017-04-26 NOTE — Patient Outreach (Signed)
[facility]) Care Management  04/26/2017  Jamie NighGloria C Mason 09/25/1940 [id number]  Unsuccessful phone outreach to patient, unable to leave message.   Received call back from patient---patient reports she has not received albuterol inhaler yet.  Call placed to [facility], [employer] patient assistance program dispensing pharmacy---representative reports order processed 04/21/17 but has no shipping information yet, anticipate delivery 7-10 business days from order process date.  Discussed was told same thing on 04/19/17, and representative reports he will contact his supervisor to request they ensure medication is delivered to patient within 7-10 business day time frame.   Called patient back to update her.   Plan:  Continue to follow-up [employer] patient assistance.   Jamie StandardKevin Kayann Mason, PharmD, [facility]ACP Clinical Pharmacist [facility] [phone]

## 2017-04-27 ENCOUNTER — Other Ambulatory Visit: Payer: Self-pay | Admitting: Pharmacist

## 2017-04-27 NOTE — Patient Outreach (Signed)
[facility]) Care Management  04/27/2017  Jamie NighGloria C Mason 05/06/1941 [id number]  Received voicemail from patient that she received her albuterol inhaler today from [employer] patient assistance.  Successful return call to patient.  HIPAA details verified.    Patient was reminded her application for [employer] patient assistance is valid until 06/12/17 at which time she will need to re-apply.    Patient denies other pharmacy related concerns at this time and is aware she can contact [facility] Pharmacist if new pharmacy concerns arise.   Plan:  Will close pharmacy case at this time.   Will update [facility] RN Valeda MalmHeath Coach of pharmacy case closure.   Tommye StandardKevin Lebert Lovern, PharmD, [facility] PinecroftBCACP Clinical Pharmacist [facility] [phone]

## 2017-05-02 ENCOUNTER — Ambulatory Visit: Payer: Medicare HMO | Admitting: Sports Medicine

## 2017-05-02 ENCOUNTER — Ambulatory Visit: Payer: Self-pay | Admitting: Pharmacist

## 2017-05-02 DIAGNOSIS — I5032 Chronic diastolic (congestive) heart failure: Secondary | ICD-10-CM | POA: Diagnosis not present

## 2017-05-02 DIAGNOSIS — N183 Chronic kidney disease, stage 3 (moderate): Secondary | ICD-10-CM | POA: Diagnosis not present

## 2017-05-02 DIAGNOSIS — J449 Chronic obstructive pulmonary disease, unspecified: Secondary | ICD-10-CM | POA: Diagnosis not present

## 2017-05-02 DIAGNOSIS — I4892 Unspecified atrial flutter: Secondary | ICD-10-CM | POA: Diagnosis not present

## 2017-05-02 DIAGNOSIS — I4891 Unspecified atrial fibrillation: Secondary | ICD-10-CM | POA: Diagnosis not present

## 2017-05-02 DIAGNOSIS — E1122 Type 2 diabetes mellitus with diabetic chronic kidney disease: Secondary | ICD-10-CM | POA: Diagnosis not present

## 2017-05-02 DIAGNOSIS — I872 Venous insufficiency (chronic) (peripheral): Secondary | ICD-10-CM | POA: Diagnosis not present

## 2017-05-02 DIAGNOSIS — I2510 Atherosclerotic heart disease of native coronary artery without angina pectoris: Secondary | ICD-10-CM | POA: Diagnosis not present

## 2017-05-02 DIAGNOSIS — I130 Hypertensive heart and chronic kidney disease with heart failure and stage 1 through stage 4 chronic kidney disease, or unspecified chronic kidney disease: Secondary | ICD-10-CM | POA: Diagnosis not present

## 2017-05-11 ENCOUNTER — Ambulatory Visit (INDEPENDENT_AMBULATORY_CARE_PROVIDER_SITE_OTHER): Payer: Medicare HMO | Admitting: Internal Medicine

## 2017-05-11 ENCOUNTER — Encounter: Payer: Self-pay | Admitting: Internal Medicine

## 2017-05-11 VITALS — BP 133/47 | HR 94 | Temp 98.4°F | Wt 314.0 lb

## 2017-05-11 DIAGNOSIS — N183 Chronic kidney disease, stage 3 unspecified: Secondary | ICD-10-CM

## 2017-05-11 DIAGNOSIS — Z794 Long term (current) use of insulin: Secondary | ICD-10-CM

## 2017-05-11 DIAGNOSIS — I483 Typical atrial flutter: Secondary | ICD-10-CM | POA: Diagnosis not present

## 2017-05-11 DIAGNOSIS — I2510 Atherosclerotic heart disease of native coronary artery without angina pectoris: Secondary | ICD-10-CM

## 2017-05-11 DIAGNOSIS — R6889 Other general symptoms and signs: Secondary | ICD-10-CM | POA: Diagnosis not present

## 2017-05-11 DIAGNOSIS — I25118 Atherosclerotic heart disease of native coronary artery with other forms of angina pectoris: Secondary | ICD-10-CM

## 2017-05-11 DIAGNOSIS — N182 Chronic kidney disease, stage 2 (mild): Secondary | ICD-10-CM | POA: Diagnosis not present

## 2017-05-11 DIAGNOSIS — I129 Hypertensive chronic kidney disease with stage 1 through stage 4 chronic kidney disease, or unspecified chronic kidney disease: Secondary | ICD-10-CM

## 2017-05-11 DIAGNOSIS — I872 Venous insufficiency (chronic) (peripheral): Secondary | ICD-10-CM

## 2017-05-11 DIAGNOSIS — E1122 Type 2 diabetes mellitus with diabetic chronic kidney disease: Secondary | ICD-10-CM

## 2017-05-11 DIAGNOSIS — I10 Essential (primary) hypertension: Secondary | ICD-10-CM

## 2017-05-11 DIAGNOSIS — Z0000 Encounter for general adult medical examination without abnormal findings: Secondary | ICD-10-CM

## 2017-05-11 LAB — POCT GLYCOSYLATED HEMOGLOBIN (HGB A1C): Hemoglobin A1C: 6.8

## 2017-05-11 LAB — GLUCOSE, CAPILLARY: Glucose-Capillary: 104 mg/dL — ABNORMAL HIGH (ref 65–99)

## 2017-05-11 MED ORDER — DILTIAZEM HCL ER COATED BEADS 240 MG PO CP24: 240.0000 mg | ORAL_CAPSULE | Freq: Every day | ORAL | 3 refills | Status: DC

## 2017-05-11 NOTE — Patient Instructions (Signed)
1. Your sugar is great 2. See a dentist 3. If your mouth does not get better, please let me know 4. I will look into cost of eliquis 5. Take 2 cardiazem pills (240 mg)

## 2017-05-12 ENCOUNTER — Encounter: Payer: Self-pay | Admitting: Internal Medicine

## 2017-05-12 LAB — CBC
Hematocrit: 31.8 % — ABNORMAL LOW (ref 34.0–46.6)
Hemoglobin: 10.2 g/dL — ABNORMAL LOW (ref 11.1–15.9)
MCH: 27.6 pg (ref 26.6–33.0)
MCHC: 32.1 g/dL (ref 31.5–35.7)
MCV: 86 fL (ref 79–97)
Platelets: 402 10*3/uL — ABNORMAL HIGH (ref 150–379)
RBC: 3.7 x10E6/uL — ABNORMAL LOW (ref 3.77–5.28)
RDW: 16.8 % — AB (ref 12.3–15.4)
WBC: 7.4 10*3/uL (ref 3.4–10.8)

## 2017-05-12 LAB — BMP8+ANION GAP
Anion Gap: 18 mmol/L (ref 10.0–18.0)
BUN / CREAT RATIO: 11 — AB (ref 12–28)
BUN: 19 mg/dL (ref 8–27)
CHLORIDE: 100 mmol/L (ref 96–106)
CO2: 27 mmol/L (ref 20–29)
CREATININE: 1.73 mg/dL — AB (ref 0.57–1.00)
Calcium: 9.4 mg/dL (ref 8.7–10.3)
GFR calc Af Amer: 33 mL/min/{1.73_m2} — ABNORMAL LOW (ref >59)
GFR calc non Af Amer: 29 mL/min/{1.73_m2} — ABNORMAL LOW (ref >59)
GLUCOSE: 102 mg/dL — AB (ref 65–99)
Potassium: 4.1 mmol/L (ref 3.5–5.2)
SODIUM: 145 mmol/L — AB (ref 134–144)

## 2017-05-12 LAB — VITAMIN B12: VITAMIN B 12: 584 pg/mL (ref 232–1245)

## 2017-05-12 LAB — VITAMIN D 25 HYDROXY (VIT D DEFICIENCY, FRACTURES): Vit D, 25-Hydroxy: 13.6 ng/mL — ABNORMAL LOW (ref 30.0–100.0)

## 2017-05-12 NOTE — Assessment & Plan Note (Signed)
This problem is chronic and stable. She is on Coreg 25, aspirin, and atorvastatin 40 mg. She has no side effects to these medications. She is asymptomatic.  PLAN:  Cont current meds

## 2017-05-12 NOTE — Assessment & Plan Note (Signed)
This problem is chronic and stable. She has some but moderate blood pressure control today. Her medication regimen has diltiazem 120 (was supposed to be on 360), Lasix 80 twice a day, Coreg 25 twice a day and hydralazine 50 3 times a day. Due to her comorbidities of diabetes, CAD, and atherosclerosis of the aorta, I would like to have better blood pressure control and will increase her diltiazem to 240.  PLAN : Increase diltiazem to 240

## 2017-05-12 NOTE — Assessment & Plan Note (Addendum)
This problem is chronic and well controlled. She is on diltiazem and is only taking 120 mg although I had titrated her up to 360. She is also on Coreg 25 twice a day, amiodarone, and Eliquis. She informs me today that her co-pay for Eliquis $200. He is rate control but I am requesting that she increase her diltiazem to 240 mg for better rate control.  PLAN : increase diltiazem 240 mg Work with our clinical pharmacist to find an affordable DOAC

## 2017-05-12 NOTE — Progress Notes (Signed)
   Subjective:    Patient ID: Jamie Mason, female    DOB: 01/24/1941, 76 y.o.   MRN: [id number]  HPI  Jamie NighGloria C Novosel is here for HTN F/U. Please see the A&P for the status of the pt's chronic medical problems.  ROS : per ROS section and in problem oriented charting. All other systems are negative.  PMHx, Soc hx, and / or Fam hx : Her daughter drives her to her appt. No longer has hospital bed.   Review of Systems  Constitutional: Negative for fever and unexpected weight change.  HENT: Positive for dental problem. Negative for sore throat.   Cardiovascular: Negative for chest pain and palpitations.  Gastrointestinal: Negative for abdominal pain, nausea and vomiting.       No dysphagia  Neurological: Negative for dizziness and light-headedness.  Psychiatric/Behavioral: Negative for self-injury.       Objective:   Physical Exam  Constitutional: She appears well-developed and well-nourished. No distress.  HENT:  Head: Normocephalic and atraumatic.  Right Ear: External ear normal.  Left Ear: External ear normal.  Nose: Nose normal.  Eyes: Conjunctivae and EOM are normal. Right eye exhibits no discharge. Left eye exhibits no discharge. No scleral icterus.  Cardiovascular: Normal rate, regular rhythm and normal heart sounds.  Early systolic harsh murmur R sternal border  Pulmonary/Chest: Effort normal and breath sounds normal. No respiratory distress.  Abdominal: Soft. Bowel sounds are normal.  Musculoskeletal: Normal range of motion. She exhibits no tenderness.  Sig B edema LE with colour changes and thickened skin  Neurological: She is alert.  Skin: Skin is warm and dry. She is not diaphoretic.  Psychiatric: She has a normal mood and affect. Her behavior is normal. Judgment and thought content normal.      Assessment & Plan:

## 2017-05-12 NOTE — Assessment & Plan Note (Signed)
She asked whether she could take Fish oil and vitamin D 5000 daily area I checked her vitamin D level which was low at 13. I have no concern about her taking over-the-counter vitamin D and will send her a letter with her results.  NEW COMPLAINT : Today she complains of pain on the left side of her time and the left side of her gum. This started before Thanksgiving without any injury. She has only been able to eat things like Jell-O and cream potatoes. She is mostly showing on the right side of her mouth. She is using H2O2 in her mouth and has noticed the pain has decreased a bit. She inquired about seeing a dentist and the dentist who told her she had to see her cardiologist first. On visual inspection of the oral cavity, I could find no abnormalities other than poor dentition. I then did a manual inspection and could not palpate any abnormalities, masses, or fluctuance. She was tender on the left cheek. I do not think she has a bacterial infection. I encouraged her to see a dentist as she has very poor dentition and will likely need teeth removal. I stressed that either I or her cardiologist would manage her anticoagulation prior to any dental procedure. I did not prescribe any antibiotics as she is slowly getting better but encouraged her to call us if things got worse.

## 2017-05-12 NOTE — Assessment & Plan Note (Signed)
This problem is chronic and stable. She recently saw Dr. Edilia Boickson of vascular who recommended weight loss, exercise, and compression stockings. She has not gotten compression stockings and has not started exercise program. Her legs remain unchanged.  PLAN : Continue to follow

## 2017-05-12 NOTE — Assessment & Plan Note (Addendum)
This problem is chronic and stable.Her A1c trend has been 6.8 - 6.4 - 6.8 today. She is on metformin 500 twice a day. She does not check her CBGs at home. She denies any hypoglycemic symptoms.  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 The right foot is not as severely affected as the left area she states when she puts her foot on the floor but it doesn't feel like her foot. She used to be on gabapentin but that was for postherpetic neuralgia pain which has since resolved. If her symptoms worsen, I can resume the gabapentin as she had no side effects to it.  PLAN:  Cont current meds

## 2017-05-16 ENCOUNTER — Telehealth: Payer: Self-pay

## 2017-05-16 NOTE — Telephone Encounter (Signed)
Requesting to speak with a nurse about mouth pain.

## 2017-05-17 NOTE — Telephone Encounter (Signed)
Have tried twice, mailbox is full

## 2017-05-17 NOTE — Telephone Encounter (Signed)
Spoke to pt, she will call back mon and report on her mouth pain as instructed by dr Midwifebutcher

## 2017-05-18 ENCOUNTER — Ambulatory Visit: Payer: Medicare HMO | Admitting: Internal Medicine

## 2017-05-23 ENCOUNTER — Ambulatory Visit: Payer: Medicare HMO | Admitting: Sports Medicine

## 2017-05-25 ENCOUNTER — Ambulatory Visit: Payer: Self-pay | Admitting: *Deleted

## 2017-06-19 ENCOUNTER — Ambulatory Visit: Payer: Self-pay | Admitting: *Deleted

## 2017-06-20 ENCOUNTER — Other Ambulatory Visit: Payer: Self-pay | Admitting: *Deleted

## 2017-06-20 NOTE — Patient Outreach (Signed)
[facility]) Care Management  06/20/2017   Jamie Mason 10/27/1940 [id number]  Subjective: RN Health Coach telephone call to patient.  [employer] compliance verified. Per patient she is doing pretty good. Patient stated she has not weighed herself today or yesterday. Per patient her weight is 300 pounds the last time she weighed.  Patient does have a talking scale  Patient stated she wants to loose down to 175 pounds. Per patient she is doing exercises for 15 min daily. Per patient her appetite is good. Patient stated she has not been to a dentist in several years. Per patient she has teeth that fall out and are chipped off. Per patient she has not been to an eye doctor in over a year. She wears reading glasses. Patient has good support of daughters that she lives with. Her daughters take her for her appointments. Patient uses a cane and walker. She has not had any recent falls. Patient has agreed to outreach calls .  Current Medications:  Current Outpatient Medications  Medication Sig Dispense Refill  . albuterol (PROVENTIL HFA;VENTOLIN HFA) 108 (90 Base) MCG/ACT inhaler Inhale 2 puffs into the lungs every 6 (six) hours as needed for wheezing or shortness of breath. 1 Inhaler 3  . amiodarone (PACERONE) 200 MG tablet Take 1 tablet (200 mg total) by mouth daily. 90 tablet 3  . apixaban (ELIQUIS) 5 MG TABS tablet Take 1 tablet (5 mg total) by mouth 2 (two) times daily. 180 tablet 3  . aspirin (ASPIR-LOW) 81 MG EC tablet Take 81 mg by mouth daily. Swallow whole.    Marland Kitchen. atorvastatin (LIPITOR) 40 MG tablet Take 1 tablet (40 mg total) by mouth daily. 90 tablet 3  . Capsaicin-Menthol-Methyl Sal (CAPSAICIN-METHYL SAL-MENTHOL) 0.025-1-12 % CREA Apply 1 mg topically 4 (four) times daily. (Patient not taking: Reported on 04/06/2017) 1 Tube 2  . carvedilol (COREG) 25 MG tablet Take 1 tablet (25 mg total) by mouth 2 (two) times daily with a meal. 90 tablet 3  . diltiazem (CARDIZEM CD) 240 MG 24 hr  capsule Take 1 capsule (240 mg total) by mouth daily. 90 capsule 3  . furosemide (LASIX) 40 MG tablet Take 1 tablet (40 mg total) by mouth 2 (two) times daily. Take 2 tablets by mouth every morning and 1 tablet by mouth every afternoon (Patient taking differently: Take 40 mg by mouth 2 (two) times daily. ) 120 tablet 2  . hydrALAZINE (APRESOLINE) 50 MG tablet Take 1 tablet (50 mg total) by mouth 3 (three) times daily. 90 tablet 2  . hydrocerin (EUCERIN) CREA Apply to bilateral LEs after cleansing on Mondays and Thursdays (prior to [employer] application). 113 g 0  . metFORMIN (GLUCOPHAGE) 500 MG tablet Take 1 tablet (500 mg total) by mouth 2 (two) times daily with a meal. 180 tablet 3  . polyethylene glycol powder (GLYCOLAX/MIRALAX) powder MIX 1 CAPFUL (17GM) IN LIQUID AND DRINK EVERY DAY (Patient not taking: Reported on 04/06/2017) 1530 g 2  . traMADol (ULTRAM) 50 MG tablet Take 1 tablet (50 mg total) by mouth daily as needed. 10 tablet 0   No current facility-administered medications for this visit.     Functional Status:  In your present state of health, do you have any difficulty performing the following activities: 06/20/2017 03/09/2017  Hearing? N N  Vision? N N  Comment reading -  Difficulty concentrating or making decisions? N N  Walking or climbing stairs? Y Y  Dressing or bathing? Alpha GulaN Y  Doing errands, shopping? Malvin Johns  Comment daughters take patient -  Quarry manager and eating ? N -  Using the Toilet? N -  In the past six months, have you accidently leaked urine? N -  Do you have problems with loss of bowel control? N -  Managing your Medications? N -  Managing your Finances? N -  Housekeeping or managing your Housekeeping? Y -  Some recent data might be hidden    Fall/Depression Screening: Fall Risk  06/20/2017 03/09/2017 02/02/2017  Falls in the past year? No No No  Number falls in past yr: - - -  Injury with Fall? - - -  Risk Factor Category  - - -  Risk for fall due to :  Impaired balance/gait;Impaired mobility - -  Risk for fall due to: Comment - - -  Follow up - - -   PHQ 2/9 Scores 06/20/2017 03/09/2017 02/02/2017 12/23/2016 12/01/2016 11/15/2016 11/11/2016  PHQ - 2 Score 0 0 0 0 0 0 0   [facility] CM Care Plan Problem One     Most Recent Value  Care Plan Problem One  Knowlwdge Deficit in self management of Congestive Heart Failure  Role Documenting the Problem One  Health Coach  Care Plan for Problem One  Active  [facility] Long Term Goal   Patient will not have any admission for Congestive Heart Failure within the next 90 days  [facility] Long Term Goal Start Date  06/20/17  Interventions for Problem One Long Term Goal  RN discussed the zones and action plan of Congestive Heart Failure. RN discussed the imprtance of weighing to be able watch for fluid weight gain and notify the Dr. Theola Sequin  discussed the importance of keeping Dr appointments and follow up care. RN will follow up outreach with further discussion and teach back  [facility] CM Short Term Goal #1   Patient will weigh daily and record within the next 30 days  [facility] CM Short Term Goal #1 Start Date  06/20/17  Interventions for Short Term Goal #1  RN discussed the importance of monitoring weight adn notifying physician of 3 pounds in one day or 5 pounds in a week. RN sent a 2019 calendar book for documentation and discussed following up holding patient accountable for  weighing and documenting,  Rn will follow up outreach for further discussion and teach back  [facility] CM Short Term Goal #2   Patient will verbalize receiving information on portion control within the next 30 days  [facility] CM Short Term Goal #2 Start Date  06/20/17  Interventions for Short Term Goal #2  RN sent EMMI educational material on portion control and serving sizes. RN discussed portion control. TN will follow up with further discussion and teach back  [facility] CM Short Term Goal #3  Patient will report foods that are low in sodium within the next 30 days  [facility] CM Short Term Goal #3  Start Date  06/20/17  Interventions for Short Tern Goal #3  RN discussed frozen foods , fresh vegetablesversus canned foods sodium intake. RN sent a picture chart of foods low and high in sodium. RN will follow up with further discussion and teach back.       Assessment:  Patient does not weigh daily Patient last weight was 300 pounds and patient wants to loose weight to 175 pounds Patient needs dental care Patient needs eye exam Patient needs additional education on low sodium diet Patient will benefit from Health Coach telephonic outreach  for education and support for congestive heart failure self management.  Plan:  RN discussed preparation of foods low in sodium RN sent EMMI educational material on low sodium diet RN discussed portion control RN sent EMMI educational material on Serving sizes and portion control RN sent picture sheet on low and high sodium foods Patient will weigh and document in new calendar book daily RN sent Barriers letter to physician and  Assessment RN sent patient a 2019 calendar book Patient will make dental and eye appointments RN will follow up outreach within the month of February Patient has follow up with PCP 08/10/2017 Patient has follow up food specialist 07/11/2017   Gean Maidens BSN RN [facility] Care Management [phone]

## 2017-07-03 ENCOUNTER — Other Ambulatory Visit: Payer: Self-pay | Admitting: Internal Medicine

## 2017-07-03 DIAGNOSIS — R0609 Other forms of dyspnea: Principal | ICD-10-CM

## 2017-07-11 ENCOUNTER — Ambulatory Visit: Payer: Medicare HMO | Admitting: Sports Medicine

## 2017-07-13 ENCOUNTER — Encounter: Payer: Self-pay | Admitting: Internal Medicine

## 2017-07-21 ENCOUNTER — Other Ambulatory Visit: Payer: Self-pay | Admitting: *Deleted

## 2017-08-02 ENCOUNTER — Emergency Department (HOSPITAL_COMMUNITY): Payer: Medicare HMO

## 2017-08-02 ENCOUNTER — Emergency Department (HOSPITAL_COMMUNITY)
Admission: EM | Admit: 2017-08-02 | Discharge: 2017-08-03 | Disposition: A | Discharge status: Home / Self Care | Payer: Medicare HMO | Attending: Emergency Medicine | Admitting: Emergency Medicine

## 2017-08-02 ENCOUNTER — Encounter (HOSPITAL_COMMUNITY): Payer: Self-pay | Admitting: Family Medicine

## 2017-08-02 DIAGNOSIS — I5040 Unspecified combined systolic (congestive) and diastolic (congestive) heart failure: Secondary | ICD-10-CM | POA: Diagnosis not present

## 2017-08-02 DIAGNOSIS — S8990XA Unspecified injury of unspecified lower leg, initial encounter: Secondary | ICD-10-CM | POA: Diagnosis not present

## 2017-08-02 DIAGNOSIS — R030 Elevated blood-pressure reading, without diagnosis of hypertension: Secondary | ICD-10-CM | POA: Diagnosis not present

## 2017-08-02 DIAGNOSIS — M79606 Pain in leg, unspecified: Secondary | ICD-10-CM | POA: Diagnosis not present

## 2017-08-02 DIAGNOSIS — M79651 Pain in right thigh: Secondary | ICD-10-CM | POA: Insufficient documentation

## 2017-08-02 DIAGNOSIS — E119 Type 2 diabetes mellitus without complications: Secondary | ICD-10-CM | POA: Insufficient documentation

## 2017-08-02 DIAGNOSIS — I252 Old myocardial infarction: Secondary | ICD-10-CM | POA: Diagnosis not present

## 2017-08-02 DIAGNOSIS — M79604 Pain in right leg: Secondary | ICD-10-CM | POA: Diagnosis not present

## 2017-08-02 DIAGNOSIS — I2510 Atherosclerotic heart disease of native coronary artery without angina pectoris: Secondary | ICD-10-CM | POA: Diagnosis not present

## 2017-08-02 DIAGNOSIS — I509 Heart failure, unspecified: Secondary | ICD-10-CM | POA: Insufficient documentation

## 2017-08-02 DIAGNOSIS — I10 Essential (primary) hypertension: Secondary | ICD-10-CM | POA: Diagnosis not present

## 2017-08-02 DIAGNOSIS — M25551 Pain in right hip: Secondary | ICD-10-CM | POA: Diagnosis not present

## 2017-08-02 MED ORDER — TRAMADOL HCL 50 MG PO TABS: 50.0000 mg | ORAL_TABLET | Freq: Four times a day (QID) | ORAL | 0 refills | Status: DC | PRN

## 2017-08-02 MED ORDER — TRAMADOL HCL 50 MG PO TABS
50.0000 mg | ORAL_TABLET | Freq: Once | ORAL | Status: AC
  Administered 2017-08-02: 50 mg via ORAL
  Filled 2017-08-02: qty 1

## 2017-08-02 NOTE — ED Provider Notes (Signed)
Emergency Department Provider Note   I have reviewed the triage vital signs and the nursing notes.   HISTORY  Chief Complaint Leg Pain   HPI Jamie Mason is a 77 y.o. female with PMH of CAD, DM, dCHF, HTN, HLD, and a-flutter presents to the emergency department for evaluation of right hip and thigh pain. The patient ambulates with a walker.  She began having some soreness in the right thigh without fever and/or chills.  She denies any falls with injury.  No twisting or other symptoms.  She tried taking Tylenol at home with no relief.  No redness over the skin.  She continues to have swelling in both legs but no unilateral edema.  He denies abdominal pain.  No weakness or numbness in the arm no speech difficulties.   Past Medical History:  Diagnosis Date  . Atrial flutter with rapid ventricular response (HCC) 07/11/2016  . CAD (coronary artery disease) 05/04/2011  . Diabetes mellitus without complication (HCC)   . Diastolic heart failure (HCC) 06/2016   EF 60-65%, G2DD  . Hyperlipidemia   . Hypertension   . Lumbar disc disease   . Morbid Obesity 05/02/2011  . Non-ST elevation myocardial infarction (NSTEMI), initial episode of care [facility]) 05/02/2011    Patient Active Problem List   Diagnosis Date Noted  . Diabetic foot ulcer (HCC) 01/19/2017  . Chronic diastolic CHF (congestive heart failure) (HCC) 10/19/2016  . Atrial flutter (HCC) 07/11/2016  . Chronic venous insufficiency 06/30/2016  . Goals of care, counseling/discussion 07/24/2015  . Severe obesity (BMI >= 40) (HCC) 08/28/2014  . 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 Healthcare maintenance 03/19/2014  . CAD (coronary artery disease) 05/03/2011  . Hypertension 05/02/2011  . Hyperlipidemia 05/02/2011  . Lumbar disc disease 05/02/2011    Past Surgical History:  Procedure Laterality Date  . CARDIAC CATHETERIZATION N/A 07/11/2016   Procedure: Left Heart Cath and Coronary  Angiography;  Surgeon: [name], MD;  Location: [facility] INVASIVE CV LAB;  Service: Cardiovascular;  Laterality: N/A;  . CARDIOVERSION N/A 07/13/2016   Procedure: CARDIOVERSION;  Surgeon: Wendall Stade, MD;  Location: [facility] ENDOSCOPY;  Service: Cardiovascular;  Laterality: N/A;  . LEFT HEART CATHETERIZATION WITH CORONARY ANGIOGRAM N/A 05/03/2011   Procedure: LEFT HEART CATHETERIZATION WITH CORONARY ANGIOGRAM;  Surgeon: Othella Boyer, MD;  Location: [facility] CATH LAB;  Service: Cardiovascular;  Laterality: N/A;  . none      Current Outpatient Rx  . Order #: [id number] Class: Normal  . Order #: [id number] Class: Normal  . Order #: [id number] Class: Historical Med  . Order #: [id number] Class: Normal  . Order #: [id number] Class: Normal  . Order #: [id number] Class: Normal  . Order #: [id number] Class: Normal  . Order #: [id number] Class: Normal  . Order #: [id number] Class: Normal  . Order #: [id number] Class: Normal  . Order #: [id number] Class: Normal  . Order #: [id number] Class: Normal  . Order #: [id number] Class: Normal  . Order #: [id number] Class: Print    Allergies Lisinopril; Ace inhibitors; Codeine; Penicillins; and Tomato  Family History  Problem Relation Age of Onset  . Cancer Father   . Stroke Mother     Social History Social History   Tobacco Use  . Smoking status: Former Smoker    Packs/day: 0.50    Years: 50.00    Pack years: 25.00    Types: Cigarettes    Last attempt to quit: 04/01/2011    Years since quitting: 6.3  . Smokeless tobacco: Never  Used  Substance Use Topics  . Alcohol use: No  . Drug use: No    Review of Systems  Constitutional: No fever/chills Eyes: No visual changes. ENT: No sore throat. Cardiovascular: Denies chest pain. Respiratory: Denies shortness of breath. Gastrointestinal: No abdominal pain.  No nausea, no vomiting.  No diarrhea.  No constipation. Genitourinary: Negative for dysuria. Musculoskeletal: Negative for back pain. Positive right leg/hip pain.    Skin: Negative for rash. Neurological: Negative for headaches, focal weakness or numbness.  10-point ROS otherwise negative.  ____________________________________________   PHYSICAL EXAM:  VITAL SIGNS: ED Triage Vitals  Enc Vitals Group     BP 08/02/17 1523 (!) 143/59     Pulse Rate 08/02/17 1523 92     Resp 08/02/17 1523 18     Temp 08/02/17 1523 97.8 F (36.6 C)     Temp Source 08/02/17 1523 Oral     SpO2 08/02/17 1523 99 %     Weight 08/02/17 1529 (!) 314 lb (142.4 kg)     Height 08/02/17 1529 5\' 5"  (1.651 m)     Pain Score 08/02/17 1529 10   Constitutional: Alert and oriented. Well appearing and in no acute distress. Eyes: Conjunctivae are normal. Head: Atraumatic. Nose: No congestion/rhinnorhea. Mouth/Throat: Mucous membranes are moist.  Oropharynx non-erythematous. Neck: No stridor.  Cardiovascular: Normal rate, regular rhythm. Good peripheral circulation. Grossly normal heart sounds.   Respiratory: Normal respiratory effort.  No retractions. Lungs CTAB. Gastrointestinal: Soft and nontender. No distention.  Musculoskeletal: No lower extremity tenderness nor edema.  Normal passive range of motion of the right hip and knee.  No overlying cellulitis.  Active range of motion limited secondary to pain.  Neurologic:  Normal speech and language. No gross focal neurologic deficits are appreciated. Normal CN exam 2-12. No pronator drift.  Skin:  Skin is warm, dry. Cracking and oozing over bilateral LEs. No cellulitis.    ____________________________________________  RADIOLOGY  Dg Hip Unilat W Or Wo Pelvis 2-3 Views Right  Result Date: 08/02/2017 CLINICAL DATA:  Acute right thigh pain without known injury. EXAM: DG HIP (WITH OR WITHOUT PELVIS) 2-3V RIGHT COMPARISON:  None. FINDINGS: There is no evidence of hip fracture or dislocation. There is no evidence of arthropathy or other focal bone abnormality. IMPRESSION: Normal right hip. Electronically Signed   By: Lupita RaiderJames  Green  Jr, M.D.   On: 08/02/2017 18:54    ____________________________________________   PROCEDURES  Procedure(s) performed:   Procedures  None ____________________________________________   INITIAL IMPRESSION / ASSESSMENT AND PLAN / ED COURSE  Pertinent labs & imaging results that were available during my care of the patient were reviewed by me and considered in my medical decision making (see chart for details).  Patient presents to the emergency department for evaluation of right hip and thigh pain.  She has normal passive range of motion but active range of motion somewhat limited by pain.  No fevers, chills, concern for septic joint.  No obvious injury to the area.  Plan for plain films and pain medication.  Suspect MSK etiology.  No evidence to suggest underlying DVT.  Patient with no respiratory symptoms.  No concern for stroke based on exam and history.  Plain films reviewed with no acute abnormality. Patient provided Rx for home use. Discussed topic meds as well as strategy for LE swelling reduction. Will call PCP in the AM to schedule a follow up appointment.   At this time, I do not feel there is any life-threatening condition present.  I have reviewed and discussed all results (EKG, imaging, lab, urine as appropriate), exam findings with patient. I have reviewed nursing notes and appropriate previous records.  I feel the patient is safe to be discharged home without further emergent workup. Discussed usual and customary return precautions. Patient and family (if present) verbalize understanding and are comfortable with this plan.  Patient will follow-up with their primary care provider. If they do not have a primary care provider, information for follow-up has been provided to them. All questions have been answered.  ____________________________________________  FINAL CLINICAL IMPRESSION(S) / ED DIAGNOSES  Final diagnoses:  Right leg pain     MEDICATIONS GIVEN DURING THIS  VISIT:  Medications  traMADol (ULTRAM) tablet 50 mg (50 mg Oral Given 08/02/17 1917)     NEW OUTPATIENT MEDICATIONS STARTED DURING THIS VISIT:  New Prescriptions   TRAMADOL (ULTRAM) 50 MG TABLET    Take 1 tablet (50 mg total) by mouth every 6 (six) hours as needed.    Note:  This document was prepared using Dragon voice recognition software and may include unintentional dictation errors.  Alona Bene, MD Emergency Medicine    Long, Arlyss Repress, MD 08/02/17 [phone]

## 2017-08-02 NOTE — ED Triage Notes (Signed)
[facility] is from home [facility]. Patient is complaining right thigh pain that started 2 days ago with becoming worse this morning. Denies any injury. Pain is worse with movement and uses a walker.

## 2017-08-02 NOTE — ED Notes (Signed)
[facility] called for pt 

## 2017-08-02 NOTE — Discharge Instructions (Signed)
You were seen in the ED today with right leg pain. Your x-rays are normal. This is probably from a muscle strain. You can take the Tramadol as needed for pain. Wrap the legs in compression stockings and elevated them at home while sitting/laying down. Call your PCP tomorrow morning to arrange a close follow up appointment.   Return to the ED with any weakness, numbness, chest pain, difficulty breathing, or other concerning symptoms.

## 2017-08-08 ENCOUNTER — Ambulatory Visit: Payer: Medicare HMO | Admitting: Sports Medicine

## 2017-08-10 ENCOUNTER — Ambulatory Visit: Payer: Medicare HMO | Admitting: Internal Medicine

## 2017-09-14 ENCOUNTER — Other Ambulatory Visit: Payer: Self-pay

## 2017-09-14 ENCOUNTER — Encounter (HOSPITAL_COMMUNITY): Payer: Self-pay

## 2017-09-14 ENCOUNTER — Inpatient Hospital Stay (HOSPITAL_COMMUNITY)
Admission: EM | Admit: 2017-09-14 | Discharge: 2017-09-23 | DRG: 291 | Disposition: A | Discharge status: Home Health | Payer: Medicare HMO | Attending: Student in an Organized Health Care Education/Training Program | Admitting: Student in an Organized Health Care Education/Training Program

## 2017-09-14 ENCOUNTER — Emergency Department (HOSPITAL_COMMUNITY): Payer: Medicare HMO

## 2017-09-14 DIAGNOSIS — Z91018 Allergy to other foods: Secondary | ICD-10-CM | POA: Diagnosis not present

## 2017-09-14 DIAGNOSIS — I252 Old myocardial infarction: Secondary | ICD-10-CM | POA: Diagnosis not present

## 2017-09-14 DIAGNOSIS — E1122 Type 2 diabetes mellitus with diabetic chronic kidney disease: Secondary | ICD-10-CM | POA: Diagnosis present

## 2017-09-14 DIAGNOSIS — E876 Hypokalemia: Secondary | ICD-10-CM | POA: Diagnosis present

## 2017-09-14 DIAGNOSIS — J9600 Acute respiratory failure, unspecified whether with hypoxia or hypercapnia: Secondary | ICD-10-CM | POA: Diagnosis not present

## 2017-09-14 DIAGNOSIS — N183 Chronic kidney disease, stage 3 unspecified: Secondary | ICD-10-CM | POA: Diagnosis present

## 2017-09-14 DIAGNOSIS — N179 Acute kidney failure, unspecified: Secondary | ICD-10-CM | POA: Diagnosis present

## 2017-09-14 DIAGNOSIS — Z880 Allergy status to penicillin: Secondary | ICD-10-CM

## 2017-09-14 DIAGNOSIS — I5033 Acute on chronic diastolic (congestive) heart failure: Secondary | ICD-10-CM | POA: Diagnosis present

## 2017-09-14 DIAGNOSIS — E785 Hyperlipidemia, unspecified: Secondary | ICD-10-CM | POA: Diagnosis present

## 2017-09-14 DIAGNOSIS — N182 Chronic kidney disease, stage 2 (mild): Secondary | ICD-10-CM | POA: Diagnosis not present

## 2017-09-14 DIAGNOSIS — D631 Anemia in chronic kidney disease: Secondary | ICD-10-CM | POA: Diagnosis not present

## 2017-09-14 DIAGNOSIS — L03115 Cellulitis of right lower limb: Secondary | ICD-10-CM | POA: Diagnosis not present

## 2017-09-14 DIAGNOSIS — I4892 Unspecified atrial flutter: Secondary | ICD-10-CM | POA: Diagnosis present

## 2017-09-14 DIAGNOSIS — Z6841 Body Mass Index (BMI) 40.0 and over, adult: Secondary | ICD-10-CM | POA: Diagnosis not present

## 2017-09-14 DIAGNOSIS — S8990XA Unspecified injury of unspecified lower leg, initial encounter: Secondary | ICD-10-CM | POA: Diagnosis not present

## 2017-09-14 DIAGNOSIS — J449 Chronic obstructive pulmonary disease, unspecified: Secondary | ICD-10-CM | POA: Diagnosis not present

## 2017-09-14 DIAGNOSIS — D649 Anemia, unspecified: Secondary | ICD-10-CM | POA: Diagnosis present

## 2017-09-14 DIAGNOSIS — I110 Hypertensive heart disease with heart failure: Secondary | ICD-10-CM | POA: Diagnosis not present

## 2017-09-14 DIAGNOSIS — L89159 Pressure ulcer of sacral region, unspecified stage: Secondary | ICD-10-CM | POA: Diagnosis present

## 2017-09-14 DIAGNOSIS — Z79899 Other long term (current) drug therapy: Secondary | ICD-10-CM | POA: Diagnosis not present

## 2017-09-14 DIAGNOSIS — Z7984 Long term (current) use of oral hypoglycemic drugs: Secondary | ICD-10-CM | POA: Diagnosis not present

## 2017-09-14 DIAGNOSIS — R3121 Asymptomatic microscopic hematuria: Secondary | ICD-10-CM | POA: Diagnosis not present

## 2017-09-14 DIAGNOSIS — Z809 Family history of malignant neoplasm, unspecified: Secondary | ICD-10-CM | POA: Diagnosis not present

## 2017-09-14 DIAGNOSIS — R0602 Shortness of breath: Secondary | ICD-10-CM | POA: Diagnosis not present

## 2017-09-14 DIAGNOSIS — R531 Weakness: Secondary | ICD-10-CM

## 2017-09-14 DIAGNOSIS — Z885 Allergy status to narcotic agent status: Secondary | ICD-10-CM | POA: Diagnosis not present

## 2017-09-14 DIAGNOSIS — E8770 Fluid overload, unspecified: Secondary | ICD-10-CM | POA: Diagnosis not present

## 2017-09-14 DIAGNOSIS — I361 Nonrheumatic tricuspid (valve) insufficiency: Secondary | ICD-10-CM | POA: Diagnosis not present

## 2017-09-14 DIAGNOSIS — I872 Venous insufficiency (chronic) (peripheral): Secondary | ICD-10-CM | POA: Diagnosis not present

## 2017-09-14 DIAGNOSIS — I130 Hypertensive heart and chronic kidney disease with heart failure and stage 1 through stage 4 chronic kidney disease, or unspecified chronic kidney disease: Principal | ICD-10-CM | POA: Diagnosis present

## 2017-09-14 DIAGNOSIS — L03116 Cellulitis of left lower limb: Secondary | ICD-10-CM | POA: Diagnosis present

## 2017-09-14 DIAGNOSIS — L0390 Cellulitis, unspecified: Secondary | ICD-10-CM | POA: Diagnosis present

## 2017-09-14 DIAGNOSIS — Z87891 Personal history of nicotine dependence: Secondary | ICD-10-CM | POA: Diagnosis not present

## 2017-09-14 DIAGNOSIS — Z888 Allergy status to other drugs, medicaments and biological substances status: Secondary | ICD-10-CM

## 2017-09-14 DIAGNOSIS — J189 Pneumonia, unspecified organism: Secondary | ICD-10-CM | POA: Diagnosis present

## 2017-09-14 DIAGNOSIS — I5040 Unspecified combined systolic (congestive) and diastolic (congestive) heart failure: Secondary | ICD-10-CM | POA: Diagnosis not present

## 2017-09-14 DIAGNOSIS — R296 Repeated falls: Secondary | ICD-10-CM | POA: Diagnosis present

## 2017-09-14 DIAGNOSIS — I4891 Unspecified atrial fibrillation: Secondary | ICD-10-CM | POA: Diagnosis present

## 2017-09-14 DIAGNOSIS — I2510 Atherosclerotic heart disease of native coronary artery without angina pectoris: Secondary | ICD-10-CM | POA: Diagnosis present

## 2017-09-14 DIAGNOSIS — I878 Other specified disorders of veins: Secondary | ICD-10-CM | POA: Diagnosis present

## 2017-09-14 DIAGNOSIS — J9621 Acute and chronic respiratory failure with hypoxia: Secondary | ICD-10-CM | POA: Diagnosis present

## 2017-09-14 DIAGNOSIS — I509 Heart failure, unspecified: Secondary | ICD-10-CM | POA: Diagnosis not present

## 2017-09-14 DIAGNOSIS — Z823 Family history of stroke: Secondary | ICD-10-CM

## 2017-09-14 DIAGNOSIS — E669 Obesity, unspecified: Secondary | ICD-10-CM | POA: Diagnosis not present

## 2017-09-14 DIAGNOSIS — Z7901 Long term (current) use of anticoagulants: Secondary | ICD-10-CM | POA: Diagnosis not present

## 2017-09-14 DIAGNOSIS — N184 Chronic kidney disease, stage 4 (severe): Secondary | ICD-10-CM | POA: Diagnosis present

## 2017-09-14 DIAGNOSIS — S81839A Puncture wound without foreign body, unspecified lower leg, initial encounter: Secondary | ICD-10-CM | POA: Diagnosis not present

## 2017-09-14 DIAGNOSIS — J439 Emphysema, unspecified: Secondary | ICD-10-CM | POA: Diagnosis not present

## 2017-09-14 DIAGNOSIS — J181 Lobar pneumonia, unspecified organism: Secondary | ICD-10-CM | POA: Diagnosis not present

## 2017-09-14 LAB — COMPREHENSIVE METABOLIC PANEL
ALK PHOS: 67 U/L (ref 38–126)
ALT: 19 U/L (ref 14–54)
ANION GAP: 14 (ref 5–15)
AST: 63 U/L — ABNORMAL HIGH (ref 15–41)
Albumin: 2.6 g/dL — ABNORMAL LOW (ref 3.5–5.0)
BILIRUBIN TOTAL: 2 mg/dL — AB (ref 0.3–1.2)
BUN: 51 mg/dL — ABNORMAL HIGH (ref 6–20)
CALCIUM: 8.6 mg/dL — AB (ref 8.9–10.3)
CO2: 21 mmol/L — ABNORMAL LOW (ref 22–32)
CREATININE: 2.27 mg/dL — AB (ref 0.44–1.00)
Chloride: 101 mmol/L (ref 101–111)
GFR calc Af Amer: 23 mL/min — ABNORMAL LOW (ref >60)
GFR calc non Af Amer: 20 mL/min — ABNORMAL LOW (ref >60)
GLUCOSE: 112 mg/dL — AB (ref 65–99)
Potassium: 6 mmol/L — ABNORMAL HIGH (ref 3.5–5.1)
SODIUM: 136 mmol/L (ref 135–145)
TOTAL PROTEIN: 7.7 g/dL (ref 6.5–8.1)

## 2017-09-14 LAB — URINALYSIS, ROUTINE W REFLEX MICROSCOPIC
BILIRUBIN URINE: NEGATIVE
Glucose, UA: NEGATIVE mg/dL
Ketones, ur: NEGATIVE mg/dL
Nitrite: NEGATIVE
Protein, ur: 30 mg/dL — AB
Specific Gravity, Urine: 1.014 (ref 1.005–1.030)
pH: 5 (ref 5.0–8.0)

## 2017-09-14 LAB — CBC WITH DIFFERENTIAL/PLATELET
Basophils Absolute: 0 10*3/uL (ref 0.0–0.1)
Basophils Relative: 0 %
EOS ABS: 0.5 10*3/uL (ref 0.0–0.7)
EOS PCT: 3 %
HCT: 25.4 % — ABNORMAL LOW (ref 36.0–46.0)
Hemoglobin: 8 g/dL — ABNORMAL LOW (ref 12.0–15.0)
LYMPHS PCT: 12 %
Lymphs Abs: 1.7 10*3/uL (ref 0.7–4.0)
MCH: 26.7 pg (ref 26.0–34.0)
MCHC: 31.5 g/dL (ref 30.0–36.0)
MCV: 84.7 fL (ref 78.0–100.0)
MONO ABS: 0.8 10*3/uL (ref 0.1–1.0)
MONOS PCT: 6 %
Neutro Abs: 11.6 10*3/uL — ABNORMAL HIGH (ref 1.7–7.7)
Neutrophils Relative %: 79 %
PLATELETS: 398 10*3/uL (ref 150–400)
RBC: 3 MIL/uL — AB (ref 3.87–5.11)
RDW: 16.2 % — AB (ref 11.5–15.5)
WBC: 14.5 10*3/uL — AB (ref 4.0–10.5)

## 2017-09-14 LAB — POTASSIUM: POTASSIUM: 3.3 mmol/L — AB (ref 3.5–5.1)

## 2017-09-14 LAB — TROPONIN I: Troponin I: 0.05 ng/mL (ref <0.03)

## 2017-09-14 LAB — CBG MONITORING, ED
GLUCOSE-CAPILLARY: 113 mg/dL — AB (ref 65–99)
GLUCOSE-CAPILLARY: 112 mg/dL — AB (ref 65–99)

## 2017-09-14 LAB — BRAIN NATRIURETIC PEPTIDE: B Natriuretic Peptide: 310.6 pg/mL — ABNORMAL HIGH (ref 0.0–100.0)

## 2017-09-14 MED ORDER — TRAMADOL HCL 50 MG PO TABS
50.0000 mg | ORAL_TABLET | Freq: Four times a day (QID) | ORAL | Status: DC | PRN
  Administered 2017-09-15 – 2017-09-19 (×2): 50 mg via ORAL
  Filled 2017-09-14 (×3): qty 1

## 2017-09-14 MED ORDER — ASPIRIN EC 81 MG PO TBEC
81.0000 mg | DELAYED_RELEASE_TABLET | Freq: Every day | ORAL | Status: DC
Start: 2017-09-14 — End: 2017-09-23
  Administered 2017-09-15 – 2017-09-22 (×8): 81 mg via ORAL
  Filled 2017-09-14 (×8): qty 1

## 2017-09-14 MED ORDER — SODIUM CHLORIDE 0.9 % IV SOLN
2.0000 g | Freq: Every day | INTRAVENOUS | Status: DC
  Administered 2017-09-14: 2 g via INTRAVENOUS
  Filled 2017-09-14 (×3): qty 20

## 2017-09-14 MED ORDER — INSULIN ASPART 100 UNIT/ML ~~LOC~~ SOLN
0.0000 [IU] | Freq: Three times a day (TID) | SUBCUTANEOUS | Status: DC
Start: 2017-09-15 — End: 2017-09-18
  Administered 2017-09-15: 2 [IU] via SUBCUTANEOUS
  Administered 2017-09-16: 1 [IU] via SUBCUTANEOUS
  Administered 2017-09-16: 2 [IU] via SUBCUTANEOUS
  Administered 2017-09-17: 5 [IU] via SUBCUTANEOUS
  Administered 2017-09-17: 2 [IU] via SUBCUTANEOUS
  Administered 2017-09-17: 1 [IU] via SUBCUTANEOUS
  Administered 2017-09-18: 2 [IU] via SUBCUTANEOUS

## 2017-09-14 MED ORDER — ASPIRIN 81 MG PO CHEW
324.0000 mg | CHEWABLE_TABLET | Freq: Once | ORAL | Status: AC
  Administered 2017-09-14: 324 mg via ORAL
  Filled 2017-09-14: qty 4

## 2017-09-14 MED ORDER — CARVEDILOL 25 MG PO TABS
25.0000 mg | ORAL_TABLET | Freq: Two times a day (BID) | ORAL | Status: DC
  Administered 2017-09-15 – 2017-09-22 (×14): 25 mg via ORAL
  Filled 2017-09-14 (×5): qty 1
  Filled 2017-09-14: qty 2
  Filled 2017-09-14 (×9): qty 1

## 2017-09-14 MED ORDER — FUROSEMIDE 10 MG/ML IJ SOLN
40.0000 mg | Freq: Two times a day (BID) | INTRAMUSCULAR | Status: DC
  Administered 2017-09-15 – 2017-09-16 (×3): 40 mg via INTRAVENOUS
  Filled 2017-09-14 (×3): qty 4

## 2017-09-14 MED ORDER — ATORVASTATIN CALCIUM 40 MG PO TABS: 40.0000 mg | ORAL_TABLET | Freq: Every day | ORAL | Status: DC

## 2017-09-14 MED ORDER — INSULIN ASPART 100 UNIT/ML ~~LOC~~ SOLN
0.0000 [IU] | Freq: Every day | SUBCUTANEOUS | Status: DC
  Administered 2017-09-17: 2 [IU] via SUBCUTANEOUS

## 2017-09-14 MED ORDER — FUROSEMIDE 10 MG/ML IJ SOLN: 40.0000 mg | Freq: Every day | INTRAMUSCULAR | Status: DC

## 2017-09-14 MED ORDER — POTASSIUM CHLORIDE CRYS ER 20 MEQ PO TBCR
40.0000 meq | EXTENDED_RELEASE_TABLET | Freq: Once | ORAL | Status: AC
  Administered 2017-09-14: 40 meq via ORAL
  Filled 2017-09-14: qty 2

## 2017-09-14 MED ORDER — AMIODARONE HCL 200 MG PO TABS
200.0000 mg | ORAL_TABLET | Freq: Every day | ORAL | Status: DC
  Administered 2017-09-15 – 2017-09-22 (×8): 200 mg via ORAL
  Filled 2017-09-14 (×8): qty 1

## 2017-09-14 MED ORDER — APIXABAN 5 MG PO TABS
5.0000 mg | ORAL_TABLET | Freq: Two times a day (BID) | ORAL | Status: DC
Start: 2017-09-14 — End: 2017-09-23
  Administered 2017-09-14 – 2017-09-22 (×17): 5 mg via ORAL
  Filled 2017-09-14 (×19): qty 1

## 2017-09-14 MED ORDER — IPRATROPIUM-ALBUTEROL 0.5-2.5 (3) MG/3ML IN SOLN
3.0000 mL | Freq: Four times a day (QID) | RESPIRATORY_TRACT | Status: DC
  Administered 2017-09-14 – 2017-09-15 (×4): 3 mL via RESPIRATORY_TRACT
  Filled 2017-09-14 (×4): qty 3

## 2017-09-14 MED ORDER — FUROSEMIDE 10 MG/ML IJ SOLN
80.0000 mg | Freq: Once | INTRAMUSCULAR | Status: AC
  Administered 2017-09-14: 80 mg via INTRAVENOUS
  Filled 2017-09-14: qty 8

## 2017-09-14 NOTE — ED Notes (Signed)
Dr. Criss AlvineGoldston aware of labs.

## 2017-09-14 NOTE — ED Notes (Signed)
Patient transported to X-ray 

## 2017-09-14 NOTE — ED Triage Notes (Signed)
Pt arrives [facility] from home with c/o fall and weakness . [facility] states she has been returned to bed x2 already today by them and here for eval of increased weakness. Wound at left lower leg with swelling at leg to hip. Healing wound at right thigh from fall last week. More lethargic per family. Sats dropped to 88 ion room air per E<\MS but currently 92

## 2017-09-14 NOTE — H&P (Addendum)
Date: 09/14/2017               Patient Name:  Jamie Mason MRN: [id number]  DOB: March 29, 1941 Age / Sex: 77 y.o., female   PCP: Bartholomew Crews, MD         Medical Service: Internal Medicine Teaching Service         Attending Physician: Dr. Sherwood Gambler, MD    First Contact: Dr. Leilani Merl Pager: [phone]  Second Contact: Dr. Ledell Noss Pager: [phone]       After Hours (After 5p/  First Contact Pager: [phone]  weekends / holidays): Second Contact Pager: [phone]   Chief Complaint: Generalized weakness and lower extremity swelling  History of Present Illness:  Jamie Mason is 77 yo with PMH of HTN, HLD, CAD, T2DM, CKD stage 2, A-Fib, CHF (LVEF 55-60% in 2018, hx of G1DD), chronic venous insufficiency, and morbid obesity who is presenting for evaluation of generalized weakness and bilateral lower extremity swelling. History taken directly from patient. No other family members bedside. Patient was intermittently drowsy during exam 2/2 sleepiness, which limits the history. The patient states that she was brought to hospital by [facility] today because she fell out of her chair twice today. She thinks she fell out of her chair because she is overall weak and her legs are heavy. She has noticed increased heaviness in her legs for the weeks leading up to admission. She notes that she feel about 2 weeks ago because of R upper thigh pain and leg heaviness and since then she has been relatively unable to walk 2/2 pain. She states that earlier in the week she noticed a white-clear discharge from a new wound on the L lower extremity. She denies chest pain, fevers, chills, SOB, abdominal pain, increased urinary frequency, dysuria, and change in bowel movements.   Upon arrival to the ED patient was afebrile, non-tachycardic, normotensive 120-140s/50s, and saturating 92% on room air. Initial troponin 0.05. WBC = 14.5, CBC = 8.0 (historical baseline of 8.0-9.0).  Initial BMP with Cr of 2.27  (previous baseline of 1.6-1.9, eGFR around 30-40) and K+ of 6.0, with reading of 3.3 on repeat. BNP = 310.6. Chest X ray showed bilateral interstitial edema and cardiomegaly. Urinalysis showed TNTC RBC, WBC, +LE, and negative nitrites, with 6-30 squamous cells.   Meds:  Current Meds  Medication Sig  . traMADol (ULTRAM) 50 MG tablet Take 1 tablet (50 mg total) by mouth every 6 (six) hours as needed.  . VENTOLIN HFA 108 (90 Base) MCG/ACT inhaler INHALE 2 PUFFS INTO THE LUNGS EVERY 6 HOURS AS NEEDED FOR WHEEZING OR SHORTNESS OF BREATH.   Allergies: Allergies as of 09/14/2017 - Review Complete 09/14/2017  Allergen Reaction Noted  . Lisinopril Swelling 08/19/2014  . Ace inhibitors Other (See Comments) 08/19/2014  . Codeine Itching and Rash 10/27/2016  . Penicillins Itching and Rash 05/02/2011  . Tomato  10/29/2016   Past Medical History: Past Medical History:  Diagnosis Date  . Atrial flutter with rapid ventricular response ([place]) 07/11/2016  . CAD (coronary artery disease) 05/04/2011  . Diabetes mellitus without complication ([place])   . Diastolic heart failure ([place]) 06/2016   EF 60-65%, G2DD  . Hyperlipidemia   . Hypertension   . Lumbar disc disease   . Morbid Obesity 05/02/2011  . Non-ST elevation myocardial infarction (NSTEMI), initial episode of care [facility]) 05/02/2011   Past Surgical History: Past Surgical History:  Procedure Laterality Date  . CARDIAC CATHETERIZATION N/A 07/11/2016  Procedure: Left Heart Cath and Coronary Angiography;  Surgeon: Nelva Bush, MD;  Location: [place] CV LAB;  Service: Cardiovascular;  Laterality: N/A;  . CARDIOVERSION N/A 07/13/2016   Procedure: CARDIOVERSION;  Surgeon: Josue Hector, MD;  Location: [facility] ENDOSCOPY;  Service: Cardiovascular;  Laterality: N/A;  . LEFT HEART CATHETERIZATION WITH CORONARY ANGIOGRAM N/A 05/03/2011   Procedure: LEFT HEART CATHETERIZATION WITH CORONARY ANGIOGRAM;  Surgeon: Jacolyn Reedy, MD;  Location: [facility] CATH LAB;   Service: Cardiovascular;  Laterality: N/A;  . none     Family History:  Family History  Problem Relation Age of Onset  . Cancer Father   . Stroke Mother    Social History:  Patient lives at home with her husband and children. Patient states that she has someone come to her house to help her daily. Takes all medications as prescribed and without difficulty. Former smoker, but quit a few years ago. (25 pack year history). No alcohol or illicit drugs.  Review of Systems: A complete ROS was negative except as per HPI.  Physical Exam: Blood pressure 126/72, pulse 63, temperature 98.8 F (37.1 C), temperature source Oral, resp. rate 18, height 5' 5" (1.651 m), weight (!) 315 lb (142.9 kg), SpO2 97 %.  Physical Exam  Constitutional: She is oriented to person, place, and time.  Elderly woman laying comfortable in bed non-diaphoretic and in no acute distress.  HENT:  Mouth/Throat: Oropharynx is clear and moist. No oropharyngeal exudate.  Eyes: Pupils are equal, round, and reactive to light. Conjunctivae and EOM are normal. Right eye exhibits no discharge. Left eye exhibits no discharge.  Cardiovascular: Normal rate, regular rhythm and intact distal pulses. Exam reveals no friction rub.  No murmur heard. Respiratory:  Poor effort during exam. Some accessory muscle use during exam. Mild dyspnea while speaking in full sentences. Bibasilar crackles noted on exam with intermittent end expiratory wheezing.   GI: Soft. Bowel sounds are normal. She exhibits no distension. There is no tenderness. There is no rebound.  Musculoskeletal: She exhibits edema (1-2+ bilateral pitting edema of bilateral lower extremities to knees). She exhibits no tenderness (of bilateral lower extremities).  Neurological: She is alert and oriented to person, place, and time.  Face strength and sensation intact bilaterally. Tongue midline. Gross motor and sensation to light touch of upper and lower extremities intact  bilaterally.   Skin:  Dry scaly skin on bilateral lower extremities and chronic hyperpigmentation on distal bilateral lower extremities. On anteriorlateral aspect of L lower extremity there is an open wound with yellowish scaly crust overlying it (measures about 3 cm in length). Some increased erythema relative to R lower extremity.      EKG: personally reviewed my interpretation is sinus rhythm with old LBBB noted on EKG 6 months prior. No signs of ST elevation and TWI to suggest new ischemia.  CXR: personally reviewed my interpretation is cardiomegaly with increased interstitial edema. No pleural effusions or focal opacities to suggest lobar infiltrate.   Assessment & Plan by Problem: Principal Problem:   Acute CHF (congestive heart failure) (HCC) Active Problems:   Cellulitis   Weakness generalized  Saphire Barnhart is 77 yo with PMH of HTN, HLD, CAD, T2DM, CKD stage 2, A-Fib, CHF (LVEF 55-60% in 2018, hx of G1DD), chronic venous insufficiency, and morbid obesity who is presenting for evaluation of generalized weakness and bilateral lower extremity swelling. Patient has signs of symptoms of volume overload on exam, along with evidence of L lower extremity cellulitis. Patient admitted to  internal medicine teaching service for management. The specific problems addressed during admission are as follows:  Acute CHF (LVEF 55-60% in 2018, hx of G1DD): Exam with bilateral pitting edema, which is consistent with known history of venous stasis. Current weight at baseline per chart review, but patient does not weigh herself daily at home so unsure of any recent weight gain. She does however, have crackles along with mild respiratory distress off supplemental oxygen on PE. This, along with chest imaging and elevated BNP, suggests volume overload. Unclear etiology of patient's acute decompensation at this time. Initial troponin mildly elevated at 0.05, EKG without signs of ischemia and patient without recent  chest pain. Suspect mild troponin elevation 2/2 acute decompensation rather than ACS, but will trend troponin levels overnight. Patient with hx of A-Fib, but in NSR on presentation and no endorsement of palpitations therefore do not suspect this plays a role in acute decompensation. Patient takes lasix 40 mg BID at home, which she states she has taken without difficulty and has not missed doses. There is some discrepancy in the chart regarding this dose, however, that it seems that patient is to be on lasix 80 mg BID. Patient does not watch dietary salt intake, so it is possible that is the cause of her decompensation. Patient received lasix IV 80 mg in ED and plan to monitor UOP and symptoms on this regimen.  -Admit to med-surg with telemetry -Trend troponin q6 hours x2 more readings -IV Lasix 40 mg BID -DuoNebs, albuterol PRN -Strict I&O, daily weight  LLE cellulitis: Patient with bilateral skin changes consistent with chronic venous stasis. Patient does, however, have an open wound with history of discharge and yellow-crusting on exam. No current signs of purulent discharge or systemic infection. Will cover for non-purulent cellulitis 2/2 strep or MSSA infection for total of 5 days of therapy.  -F/u blood cultures taken on admission -Ceftriaxone 2g daily -Home tramadol 50 mg daily for pain -Wound care consult -Transition to keflex on discharge  Recurrent falls, weakness: Patient's initial complaints of weakness and falls may be 2/2 underlying infection or acute CHF decompensation. Unclear of what patient's baseline functioning is, however, she does report history of multiple falls (at least 2 in the weeks leading up to admission and 3 on day of presentation). She may benefit from PT/OT evaluation and additional equipment in the home to prevent future falls.  -PT/OT consult  Acute on chronic kidney disease, stage 2: Patient's Cr elevated from baseline, suspect relative volume depletion 2/2 to  acute CHF. Will have to closely monitor Cr while on diuretics. -Monitor Cr with daily BMP while on lasix -Avoid nephrotoxic agents  A-Fib, HTN: Currently in NSR. On amiodarone, diltiazem, and carvedilol at home. Given normotension and HR of 60 in ED, will hold diltiazem for now. Will consider adding back this and other home anti-hypertensive medications (hydralazine 50 mg TID) as needed during hospitalization  -Continue home Eliquis for anticoagulation -Continue home amiodarone 200 mg daily, carvedilol 25 mg BID -Hold home diltiazem and hydralazine, add back PRN  T2DM: Patient's last hemoglobin A1C = 6.8% on 04/2017, suggesting good control on metformin 500 mg BID at home. Will hold metformin while inpatient and monitor BG on SSI.  -CBG TID and qHS -SSI TID with meals and at bedtime  FEN/GI: -Heart Healthy/Carb Modified diet -No IVF, replace electrolytes as needed -Protonix  VTE Prophylaxis: Eliquis 5 mg BID Code Status: Partial, DNI  Dispo: Admit patient to Inpatient with expected length of stay greater  than 2 midnights.  SignedThomasene Ripple, MD 09/14/2017, 11:04 PM  Pager: [phone]

## 2017-09-14 NOTE — ED Provider Notes (Signed)
[facility] EMERGENCY DEPARTMENT Provider Note   CSN: [id number] Arrival date & time: 09/14/17  1545     History   Chief Complaint Chief Complaint  Patient presents with  . Fall  . Weakness    HPI Jamie Mason is a 77 y.o. female.  HPI  77 year old female with a history of coronary disease, atrial flutter, diastolic heart failure, diabetes and morbid obesity presents with weakness and leg swelling.  History is taken from patient and family.  She has slid out of her chair 3 times today.  She has not suffered any injuries but each time was unable to get up.  She had to call the ambulance who got her up a couple times and then transported her this time.  She is also been having increased falls over the last week or so.  She was seen in the emergency department a couple months ago for her right leg hurting.  She states this pain is still present.  She feels like both legs are swollen more than typical.  She has been compliant with her Lasix.  She always has shortness of breath and it is not worse than typical.  She denies any headache, chest pain, abdominal pain.  No urinary symptoms.  She has chronic back pain but no worse back pain or incontinence.  Past Medical History:  Diagnosis Date  . Atrial flutter with rapid ventricular response (HCC) 07/11/2016  . CAD (coronary artery disease) 05/04/2011  . Diabetes mellitus without complication (HCC)   . Diastolic heart failure (HCC) 06/2016   EF 60-65%, G2DD  . Hyperlipidemia   . Hypertension   . Lumbar disc disease   . Morbid Obesity 05/02/2011  . Non-ST elevation myocardial infarction (NSTEMI), initial episode of care [facility]) 05/02/2011    Patient Active Problem List   Diagnosis Date Noted  . Diabetic foot ulcer (HCC) 01/19/2017  . Chronic diastolic CHF (congestive heart failure) (HCC) 10/19/2016  . Atrial flutter (HCC) 07/11/2016  . Chronic venous insufficiency 06/30/2016  . Goals of care, counseling/discussion  07/24/2015  . Severe obesity (BMI >= 40) (HCC) 08/28/2014  . 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 Healthcare maintenance 03/19/2014  . CAD (coronary artery disease) 05/03/2011  . Hypertension 05/02/2011  . Hyperlipidemia 05/02/2011  . Lumbar disc disease 05/02/2011    Past Surgical History:  Procedure Laterality Date  . CARDIAC CATHETERIZATION N/A 07/11/2016   Procedure: Left Heart Cath and Coronary Angiography;  Surgeon: [name], MD;  Location: [facility] INVASIVE CV LAB;  Service: Cardiovascular;  Laterality: N/A;  . CARDIOVERSION N/A 07/13/2016   Procedure: CARDIOVERSION;  Surgeon: Wendall Stade, MD;  Location: [facility] ENDOSCOPY;  Service: Cardiovascular;  Laterality: N/A;  . LEFT HEART CATHETERIZATION WITH CORONARY ANGIOGRAM N/A 05/03/2011   Procedure: LEFT HEART CATHETERIZATION WITH CORONARY ANGIOGRAM;  Surgeon: Othella Boyer, MD;  Location: [facility] CATH LAB;  Service: Cardiovascular;  Laterality: N/A;  . none       OB History   None      Home Medications    Prior to Admission medications   Medication Sig Start Date End Date Taking? Authorizing Provider  traMADol (ULTRAM) 50 MG tablet Take 1 tablet (50 mg total) by mouth every 6 (six) hours as needed. 08/02/17  Yes Long, Arlyss Repress, MD  VENTOLIN HFA 108 (90 Base) MCG/ACT inhaler INHALE 2 PUFFS INTO THE LUNGS EVERY 6 HOURS AS NEEDED FOR WHEEZING OR SHORTNESS OF BREATH. 07/04/17  Yes Burns Spain, MD  amiodarone (PACERONE) 200 MG tablet Take 1 tablet (200 mg total) by mouth daily. 12/22/16   Burns Spain, MD  apixaban (ELIQUIS) 5 MG TABS tablet Take 1 tablet (5 mg total) by mouth 2 (two) times daily. 09/06/16   Burns Spain, MD  aspirin (ASPIR-LOW) 81 MG EC tablet Take 81 mg by mouth daily. Swallow whole.    [provider]  atorvastatin (LIPITOR) 40 MG tablet Take 1 tablet (40 mg total) by mouth daily. 09/06/16   Burns Spain, MD  Capsaicin-Menthol-Methyl  Sal (CAPSAICIN-METHYL SAL-MENTHOL) 0.025-1-12 % CREA Apply 1 mg topically 4 (four) times daily. Patient not taking: Reported on 04/06/2017 03/09/17   Nyra Market, MD  carvedilol (COREG) 25 MG tablet Take 1 tablet (25 mg total) by mouth 2 (two) times daily with a meal. 11/03/16   Burns Spain, MD  diltiazem (CARDIZEM CD) 240 MG 24 hr capsule Take 1 capsule (240 mg total) by mouth daily. 05/11/17 08/02/17  Burns Spain, MD  furosemide (LASIX) 40 MG tablet Take 1 tablet (40 mg total) by mouth 2 (two) times daily. Take 2 tablets by mouth every morning and 1 tablet by mouth every afternoon Patient taking differently: Take 40 mg by mouth 2 (two) times daily.  11/15/16   Rice, Jamesetta Orleans, MD  hydrALAZINE (APRESOLINE) 50 MG tablet Take 1 tablet (50 mg total) by mouth 3 (three) times daily. 12/29/16   Rozann Lesches, MD  hydrocerin (EUCERIN) CREA Apply to bilateral LEs after cleansing on Mondays and Thursdays (prior to [employer] application). 06/30/16   Noralee Stain, DO  metFORMIN (GLUCOPHAGE) 500 MG tablet Take 1 tablet (500 mg total) by mouth 2 (two) times daily with a meal. 10/21/16   Burns Spain, MD  polyethylene glycol powder (GLYCOLAX/MIRALAX) powder MIX 1 CAPFUL (17GM) IN LIQUID AND DRINK EVERY DAY Patient not taking: Reported on 04/06/2017 02/21/17   Burns Spain, MD    Family History Family History  Problem Relation Age of Onset  . Cancer Father   . Stroke Mother     Social History Social History   Tobacco Use  . Smoking status: Former Smoker    Packs/day: 0.50    Years: 50.00    Pack years: 25.00    Types: Cigarettes    Last attempt to quit: 04/01/2011    Years since quitting: 6.4  . Smokeless tobacco: Never Used  Substance Use Topics  . Alcohol use: No  . Drug use: No     Allergies   Lisinopril; Ace inhibitors; Codeine; Penicillins; and Tomato   Review of Systems Review of Systems  Constitutional: Negative for fever.  Respiratory:  Negative for shortness of breath.   Cardiovascular: Positive for leg swelling. Negative for chest pain.  Gastrointestinal: Negative for abdominal pain, diarrhea and vomiting.  Genitourinary: Negative for dysuria.  Musculoskeletal: Positive for back pain.  Neurological: Positive for weakness.  All other systems reviewed and are negative.    Physical Exam Updated Vital Signs BP 126/72   Pulse 63   Temp 98.8 F (37.1 C) (Oral)   Resp 18   Ht 5\' 5"  (1.651 m)   Wt (!) 142.9 kg (315 lb)   SpO2 97%   BMI 52.42 kg/m   Physical Exam  Constitutional: She is oriented to person, place, and time. She appears well-developed and well-nourished. No distress.  Morbidly obese  HENT:  Head: Normocephalic and atraumatic.  Right Ear: External ear normal.  Left  Ear: External ear normal.  Nose: Nose normal.  Eyes: Right eye exhibits no discharge. Left eye exhibits no discharge.  Cardiovascular: Normal rate, regular rhythm and normal heart sounds.  Pulmonary/Chest: Effort normal.  Decreased BS diffusely, unclear if pathologic or from large chest wall  Abdominal: Soft. There is no tenderness.  Musculoskeletal: She exhibits edema.  BLE swollen, difficult to appreciate how much due to very large legs in general  Neurological: She is alert and oriented to person, place, and time.  Skin: Skin is warm and dry. She is not diaphoretic.  Nursing note and vitals reviewed.    ED Treatments / Results  Labs (all labs ordered are listed, but only abnormal results are displayed) Labs Reviewed  COMPREHENSIVE METABOLIC PANEL - Abnormal; Notable for the following components:      Result Value   Potassium 6.0 (*)    CO2 21 (*)    Glucose, Bld 112 (*)    BUN 51 (*)    Creatinine, Ser 2.27 (*)    Calcium 8.6 (*)    Albumin 2.6 (*)    AST 63 (*)    Total Bilirubin 2.0 (*)    GFR calc non Af Amer 20 (*)    GFR calc Af Amer 23 (*)    All other components within normal limits  BRAIN NATRIURETIC PEPTIDE  - Abnormal; Notable for the following components:   B Natriuretic Peptide 310.6 (*)    All other components within normal limits  TROPONIN I - Abnormal; Notable for the following components:   Troponin I 0.05 (*)    All other components within normal limits  CBC WITH DIFFERENTIAL/PLATELET - Abnormal; Notable for the following components:   WBC 14.5 (*)    RBC 3.00 (*)    Hemoglobin 8.0 (*)    HCT 25.4 (*)    RDW 16.2 (*)    Neutro Abs 11.6 (*)    All other components within normal limits  URINALYSIS, ROUTINE W REFLEX MICROSCOPIC - Abnormal; Notable for the following components:   Color, Urine AMBER (*)    APPearance TURBID (*)    Hgb urine dipstick SMALL (*)    Protein, ur 30 (*)    Leukocytes, UA LARGE (*)    Bacteria, UA MANY (*)    Squamous Epithelial / LPF 6-30 (*)    All other components within normal limits  POTASSIUM - Abnormal; Notable for the following components:   Potassium 3.3 (*)    All other components within normal limits  CBG MONITORING, ED - Abnormal; Notable for the following components:   Glucose-Capillary 113 (*)    All other components within normal limits  URINE CULTURE  CULTURE, BLOOD (ROUTINE X 2)  CULTURE, BLOOD (ROUTINE X 2)    EKG EKG Interpretation  Date/Time:  Thursday September 14 2017 17:36:28 EDT Ventricular Rate:  63 PR Interval:    QRS Duration: 126 QT Interval:  451 QTC Calculation: 462 R Axis:   61 Text Interpretation:  Sinus rhythm Short PR interval Left bundle branch block Artifact in lead(s) I II III aVR aVL aVF V1 besides artifact, no significant change since 2018 Confirmed by Pricilla LovelessGoldston, Dannette Kinkaid [phone]([id number]) on 09/14/2017 6:04:24 PM   Radiology Dg Chest 2 View  Result Date: 09/14/2017 CLINICAL DATA:  77 y/o F; leg swelling, weakness, and heart failure. EXAM: CHEST - 2 VIEW COMPARISON:  01/24/2017 chest radiograph FINDINGS: Stable cardiomegaly given projection and technique. Aortic atherosclerosis with calcification. Reticular and peripheral  linear opacities compatible with interstitial  pulmonary edema. No consolidation. No pleural effusion or pneumothorax. No acute osseous abnormality is evident. IMPRESSION: Stable cardiomegaly. Interstitial pulmonary edema. Aortic atherosclerosis Electronically Signed   By: Mitzi Hansen M.D.   On: 09/14/2017 18:12    Procedures Procedures (including critical care time)  Medications Ordered in ED Medications  ipratropium-albuterol (DUONEB) 0.5-2.5 (3) MG/3ML nebulizer solution 3 mL (3 mLs Nebulization Given 09/14/17 2138)  amiodarone (PACERONE) tablet 200 mg (200 mg Oral Not Given 09/14/17 2200)  apixaban (ELIQUIS) tablet 5 mg (has no administration in time range)  aspirin EC tablet 81 mg (81 mg Oral Not Given 09/14/17 2200)  atorvastatin (LIPITOR) tablet 40 mg (40 mg Oral Not Given 09/14/17 2200)  carvedilol (COREG) tablet 25 mg (has no administration in time range)  traMADol (ULTRAM) tablet 50 mg (has no administration in time range)  cefTRIAXone (ROCEPHIN) 2 g in sodium chloride 0.9 % 100 mL IVPB (has no administration in time range)  insulin aspart (novoLOG) injection 0-9 Units (has no administration in time range)  insulin aspart (novoLOG) injection 0-5 Units (has no administration in time range)  aspirin chewable tablet 324 mg (324 mg Oral Given 09/14/17 1952)  potassium chloride SA (K-DUR,KLOR-CON) CR tablet 40 mEq (40 mEq Oral Given 09/14/17 2042)  furosemide (LASIX) injection 80 mg (80 mg Intravenous Given 09/14/17 2042)     Initial Impression / Assessment and Plan / ED Course  I have reviewed the triage vital signs and the nursing notes.  Pertinent labs & imaging results that were available during my care of the patient were reviewed by me and considered in my medical decision making (see chart for details).     Patient's vital signs are stable.  She is not in distress.  However her workup is most consistent with acute on chronic CHF.  This would explain her progressive weakness  and leg swelling.  Mild bump in her creatinine as well.  Initial K is 6.0 but this is hemolyzed.  Repeat is 3.3.  She will be given supplemental potassium and IV Lasix.  While she is not in distress, given the inability to get up off the floor on her own she will need to be admitted for diuresis.  Internal medicine teaching service to admit.  Low level troponin is probably from CHF rather than ACS.  Final Clinical Impressions(s) / ED Diagnoses   Final diagnoses:  Acute on chronic congestive heart failure, unspecified heart failure type [facility])  Hypokalemia    ED Discharge Orders    None       Pricilla Loveless, MD 09/14/17 2227

## 2017-09-14 NOTE — ED Notes (Signed)
Weeping wound noted at left lower leg and swelling noted to both LE.

## 2017-09-14 NOTE — ED Notes (Signed)
Pharmacist notified on pt.'s Eliqius order .

## 2017-09-14 NOTE — ED Notes (Signed)
IV attempted x 1 withut success but labs drawn.

## 2017-09-15 DIAGNOSIS — E785 Hyperlipidemia, unspecified: Secondary | ICD-10-CM | POA: Diagnosis present

## 2017-09-15 DIAGNOSIS — I509 Heart failure, unspecified: Secondary | ICD-10-CM

## 2017-09-15 DIAGNOSIS — D631 Anemia in chronic kidney disease: Secondary | ICD-10-CM | POA: Diagnosis not present

## 2017-09-15 DIAGNOSIS — E1122 Type 2 diabetes mellitus with diabetic chronic kidney disease: Secondary | ICD-10-CM | POA: Diagnosis present

## 2017-09-15 DIAGNOSIS — N179 Acute kidney failure, unspecified: Secondary | ICD-10-CM | POA: Diagnosis present

## 2017-09-15 DIAGNOSIS — Z87891 Personal history of nicotine dependence: Secondary | ICD-10-CM | POA: Diagnosis not present

## 2017-09-15 DIAGNOSIS — Z79899 Other long term (current) drug therapy: Secondary | ICD-10-CM | POA: Diagnosis not present

## 2017-09-15 DIAGNOSIS — Z888 Allergy status to other drugs, medicaments and biological substances status: Secondary | ICD-10-CM

## 2017-09-15 DIAGNOSIS — Z880 Allergy status to penicillin: Secondary | ICD-10-CM | POA: Diagnosis not present

## 2017-09-15 DIAGNOSIS — Z885 Allergy status to narcotic agent status: Secondary | ICD-10-CM | POA: Diagnosis not present

## 2017-09-15 DIAGNOSIS — J181 Lobar pneumonia, unspecified organism: Secondary | ICD-10-CM | POA: Diagnosis not present

## 2017-09-15 DIAGNOSIS — I872 Venous insufficiency (chronic) (peripheral): Secondary | ICD-10-CM

## 2017-09-15 DIAGNOSIS — L03116 Cellulitis of left lower limb: Secondary | ICD-10-CM | POA: Diagnosis present

## 2017-09-15 DIAGNOSIS — R3121 Asymptomatic microscopic hematuria: Secondary | ICD-10-CM

## 2017-09-15 DIAGNOSIS — L03115 Cellulitis of right lower limb: Secondary | ICD-10-CM | POA: Diagnosis not present

## 2017-09-15 DIAGNOSIS — I4891 Unspecified atrial fibrillation: Secondary | ICD-10-CM | POA: Diagnosis not present

## 2017-09-15 DIAGNOSIS — N184 Chronic kidney disease, stage 4 (severe): Secondary | ICD-10-CM | POA: Diagnosis present

## 2017-09-15 DIAGNOSIS — Z6841 Body Mass Index (BMI) 40.0 and over, adult: Secondary | ICD-10-CM | POA: Diagnosis not present

## 2017-09-15 DIAGNOSIS — L0390 Cellulitis, unspecified: Secondary | ICD-10-CM | POA: Diagnosis not present

## 2017-09-15 DIAGNOSIS — I2510 Atherosclerotic heart disease of native coronary artery without angina pectoris: Secondary | ICD-10-CM | POA: Diagnosis present

## 2017-09-15 DIAGNOSIS — N183 Chronic kidney disease, stage 3 (moderate): Secondary | ICD-10-CM | POA: Diagnosis not present

## 2017-09-15 DIAGNOSIS — I4892 Unspecified atrial flutter: Secondary | ICD-10-CM | POA: Diagnosis present

## 2017-09-15 DIAGNOSIS — Z7984 Long term (current) use of oral hypoglycemic drugs: Secondary | ICD-10-CM

## 2017-09-15 DIAGNOSIS — Z823 Family history of stroke: Secondary | ICD-10-CM | POA: Diagnosis not present

## 2017-09-15 DIAGNOSIS — N182 Chronic kidney disease, stage 2 (mild): Secondary | ICD-10-CM | POA: Diagnosis not present

## 2017-09-15 DIAGNOSIS — Z7901 Long term (current) use of anticoagulants: Secondary | ICD-10-CM | POA: Diagnosis not present

## 2017-09-15 DIAGNOSIS — E876 Hypokalemia: Secondary | ICD-10-CM | POA: Diagnosis present

## 2017-09-15 DIAGNOSIS — D649 Anemia, unspecified: Secondary | ICD-10-CM | POA: Diagnosis not present

## 2017-09-15 DIAGNOSIS — I361 Nonrheumatic tricuspid (valve) insufficiency: Secondary | ICD-10-CM | POA: Diagnosis not present

## 2017-09-15 DIAGNOSIS — Z91018 Allergy to other foods: Secondary | ICD-10-CM | POA: Diagnosis not present

## 2017-09-15 DIAGNOSIS — J9621 Acute and chronic respiratory failure with hypoxia: Secondary | ICD-10-CM | POA: Diagnosis present

## 2017-09-15 DIAGNOSIS — Z809 Family history of malignant neoplasm, unspecified: Secondary | ICD-10-CM | POA: Diagnosis not present

## 2017-09-15 DIAGNOSIS — J9600 Acute respiratory failure, unspecified whether with hypoxia or hypercapnia: Secondary | ICD-10-CM | POA: Diagnosis not present

## 2017-09-15 DIAGNOSIS — I130 Hypertensive heart and chronic kidney disease with heart failure and stage 1 through stage 4 chronic kidney disease, or unspecified chronic kidney disease: Secondary | ICD-10-CM | POA: Diagnosis present

## 2017-09-15 DIAGNOSIS — I5033 Acute on chronic diastolic (congestive) heart failure: Secondary | ICD-10-CM | POA: Diagnosis present

## 2017-09-15 DIAGNOSIS — I252 Old myocardial infarction: Secondary | ICD-10-CM | POA: Diagnosis not present

## 2017-09-15 DIAGNOSIS — J189 Pneumonia, unspecified organism: Secondary | ICD-10-CM | POA: Diagnosis present

## 2017-09-15 DIAGNOSIS — R531 Weakness: Secondary | ICD-10-CM | POA: Diagnosis not present

## 2017-09-15 DIAGNOSIS — E669 Obesity, unspecified: Secondary | ICD-10-CM | POA: Diagnosis not present

## 2017-09-15 DIAGNOSIS — Z9181 History of falling: Secondary | ICD-10-CM

## 2017-09-15 LAB — CBC
HEMATOCRIT: 24.2 % — AB (ref 36.0–46.0)
HEMATOCRIT: 24.5 % — AB (ref 36.0–46.0)
Hemoglobin: 8 g/dL — ABNORMAL LOW (ref 12.0–15.0)
Hemoglobin: 7.8 g/dL — ABNORMAL LOW (ref 12.0–15.0)
MCH: 26.3 pg (ref 26.0–34.0)
MCH: 27.2 pg (ref 26.0–34.0)
MCHC: 31.8 g/dL (ref 30.0–36.0)
MCHC: 33.1 g/dL (ref 30.0–36.0)
MCV: 82.3 fL (ref 78.0–100.0)
MCV: 82.5 fL (ref 78.0–100.0)
PLATELETS: 419 10*3/uL — AB (ref 150–400)
Platelets: 405 10*3/uL — ABNORMAL HIGH (ref 150–400)
RBC: 2.94 MIL/uL — AB (ref 3.87–5.11)
RBC: 2.97 MIL/uL — ABNORMAL LOW (ref 3.87–5.11)
RDW: 15.7 % — ABNORMAL HIGH (ref 11.5–15.5)
RDW: 15.5 % (ref 11.5–15.5)
WBC: 12.5 10*3/uL — AB (ref 4.0–10.5)
WBC: 12.9 10*3/uL — ABNORMAL HIGH (ref 4.0–10.5)

## 2017-09-15 LAB — BASIC METABOLIC PANEL
ANION GAP: 10 (ref 5–15)
BUN: 45 mg/dL — ABNORMAL HIGH (ref 6–20)
CALCIUM: 8.4 mg/dL — AB (ref 8.9–10.3)
CO2: 24 mmol/L (ref 22–32)
Chloride: 105 mmol/L (ref 101–111)
Creatinine, Ser: 1.97 mg/dL — ABNORMAL HIGH (ref 0.44–1.00)
GFR, EST AFRICAN AMERICAN: 28 mL/min — AB (ref >60)
GFR, EST NON AFRICAN AMERICAN: 24 mL/min — AB (ref >60)
Glucose, Bld: 123 mg/dL — ABNORMAL HIGH (ref 65–99)
POTASSIUM: 4.1 mmol/L (ref 3.5–5.1)
Sodium: 139 mmol/L (ref 135–145)

## 2017-09-15 LAB — CREATININE, SERUM
Creatinine, Ser: 2.1 mg/dL — ABNORMAL HIGH (ref 0.44–1.00)
GFR, EST AFRICAN AMERICAN: 26 mL/min — AB (ref >60)
GFR, EST NON AFRICAN AMERICAN: 22 mL/min — AB (ref >60)

## 2017-09-15 LAB — TROPONIN I
Troponin I: 0.06 ng/mL (ref <0.03)
Troponin I: 0.05 ng/mL (ref <0.03)
Troponin I: 0.06 ng/mL (ref <0.03)

## 2017-09-15 LAB — GLUCOSE, CAPILLARY
GLUCOSE-CAPILLARY: 180 mg/dL — AB (ref 65–99)
Glucose-Capillary: 115 mg/dL — ABNORMAL HIGH (ref 65–99)

## 2017-09-15 LAB — CBG MONITORING, ED
GLUCOSE-CAPILLARY: 118 mg/dL — AB (ref 65–99)
GLUCOSE-CAPILLARY: 137 mg/dL — AB (ref 65–99)

## 2017-09-15 MED ORDER — MUPIROCIN CALCIUM 2 % EX CREA
TOPICAL_CREAM | Freq: Every day | CUTANEOUS | Status: DC
  Administered 2017-09-15 – 2017-09-22 (×8): via TOPICAL
  Filled 2017-09-15: qty 15

## 2017-09-15 MED ORDER — ACETAMINOPHEN 650 MG RE SUPP: 650.0000 mg | Freq: Four times a day (QID) | RECTAL | Status: DC | PRN

## 2017-09-15 MED ORDER — ACETAMINOPHEN 325 MG PO TABS
650.0000 mg | ORAL_TABLET | Freq: Four times a day (QID) | ORAL | Status: DC | PRN
  Administered 2017-09-15: 650 mg via ORAL
  Filled 2017-09-15 (×2): qty 2

## 2017-09-15 MED ORDER — ATORVASTATIN CALCIUM 40 MG PO TABS
40.0000 mg | ORAL_TABLET | Freq: Every day | ORAL | Status: DC
  Administered 2017-09-15 – 2017-09-22 (×8): 40 mg via ORAL
  Filled 2017-09-15 (×9): qty 1

## 2017-09-15 MED ORDER — SODIUM CHLORIDE 0.9% FLUSH
3.0000 mL | Freq: Two times a day (BID) | INTRAVENOUS | Status: DC
  Administered 2017-09-15 – 2017-09-22 (×14): 3 mL via INTRAVENOUS

## 2017-09-15 MED ORDER — POLYETHYLENE GLYCOL 3350 17 G PO PACK: 17.0000 g | PACK | Freq: Every day | ORAL | Status: DC | PRN

## 2017-09-15 MED ORDER — IPRATROPIUM-ALBUTEROL 0.5-2.5 (3) MG/3ML IN SOLN
3.0000 mL | Freq: Four times a day (QID) | RESPIRATORY_TRACT | Status: DC
  Administered 2017-09-16 – 2017-09-18 (×9): 3 mL via RESPIRATORY_TRACT
  Filled 2017-09-15 (×9): qty 3

## 2017-09-15 MED ORDER — DEXTROSE 5 % IV SOLN
500.0000 mg | Freq: Three times a day (TID) | INTRAVENOUS | Status: DC
  Administered 2017-09-15 – 2017-09-16 (×4): 500 mg via INTRAVENOUS
  Filled 2017-09-15 (×6): qty 5

## 2017-09-15 NOTE — ED Notes (Signed)
Pt ate about 75% lunch

## 2017-09-15 NOTE — Consult Note (Addendum)
WOC Nurse wound consult note Reason for Consult: Consult requested for left leg wound.  Reviewed photo in the EMR notes.  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 Wound type: Full thickness Wound bed: Dry crusted peeling skin surrounding red moist wound bed Drainage (amount, consistency, odor) some yellow drainage and odor noted in the progress notes. Generalized edema to LLE Dressing procedure/placement/frequency: Bactroban to promote moist healing. Gauze to absorb drainage.  Please re-consult if further assistance is needed.  Thank-you,  Cammie Mcgeeawn Betina Puckett MSN, RN, CWOCN, [place], CNS [phone]

## 2017-09-15 NOTE — Progress Notes (Signed)
PT Cancellation Note  Patient Details Name: Jamie NighGloria C Mason MRN: [id number] DOB: 07/31/1940   Cancelled Treatment:    Reason Eval/Treat Not Completed: Other (comment) Attempted to evaluate patient, in immediate process of transfer to unit 6E, plan to attempt to return as/if able and schedule allows.   Nedra HaiKristen Unger PT, DPT, CBIS  Supplemental Physical Therapist [facility]   Pager [phone]

## 2017-09-15 NOTE — Progress Notes (Addendum)
   Subjective: Patient was lying in her bed today went in the room.  She continues to attest to left lower extremity pain and right generalized upper thigh pain. She attested to generalized fatigue, diaphoresis and fever.  Objective:  Vital signs in last 24 hours: Vitals:   09/15/17 0715 09/15/17 0730 09/15/17 0745 09/15/17 0800  BP: (!) 144/56 (!) 124/57 135/62 137/61  Pulse: (!) 59 (!) 59 60 62  Resp: 14 15 14 18   Temp:      TempSrc:      SpO2: 98% 98% 100% 99%  Weight:      Height:       ROS negative except as per HPI.  Physical Exam: General: in no acute distress, conversant but slowly Neuro: Alert and oriented x4  Cardiovascular: RRR, no murmur rubs or gallops Pulmonary: lungs clear to auscultation bilaterally  Musc: Left lower extremity edema with evidence of venous stasis skin manifestations and an open wound (see image in media tab)  Assessment/Plan:  Principal Problem:   Acute CHF (congestive heart failure) (HCC) Active Problems:   Cellulitis   Weakness generalized   Acute on chronic heart failure (HCC)  Assessment: Jamie Mason is 77 yo F with PMHx of HTN, HLD, CAD, T2DM, CKDIV, A-fib, CHF (LV EF 55-60% in 2018, Hx of G1DD), chronic venous insufficiency, and morbid obesity who presented for evaluation of generalized weakness and bilateral lower extremity edema.  This appears to most likely be secondary to patient's acute CHF and chronic venous insufficiency.  We will dialyze the patient monitor I's and O's and treat the soft tissue infection with topical and systemic therapy.  Plan: Acute on chronic HFpEF (LV EF 55-60% in 2018, hx of G1DD): I feel that her pulmonary edema, bilateral LE edema and increased oxygen demand are associated with her HFpEF and venous statis. We will continue to diurese, apply compression wrappings and monitor the soft tissue infection. --IV lasix 40mg  BID --Strict I/O's, daily weights --Troponin low and flat, most likely represent mild  demand ischemia  LLE Cellulitis: WOC nurse consulted who is recommending Bactroban to for moist healing and gauze to absorb drainage. Cr/Cl of  --WOC nurse assisted with bandage type --Discontinue ceftriaxone  --Ancef 500mg  IV Q8 hours based on adjusted Cr/Cl of ~35  Recurrent falls, weakness: This is likely due to her overall poor physical health/conditioning and may improve following resolution of her acute illness. PT/OT consulted. Will assess when patients acute illness improves.   Acute on chronic kidney disease, stage IV:  GFR w/in the CKD 4 range at 28(borderline). Will monitor renal function and refrain from nephrotoxic agents while admitted. --BMP in am  Atrial fibrillation: NSR during evaluation. Will continue her home regimen. No acute issues. --Continue home Eliquis --Continue home amiodarone 200mg  daily, carvedilol 25mg  BID --Hold home diltiazem and hydralazine given soft BP  T2DM:  Patients last Hgb A1c=6.8% on 04/2017, she appears to be compliant with her home regimen. CBG readings appear stable since admission.  --SSI TID continued --TID and qHS monitoring   Dispo: Anticipated discharge in approximately 2-3 day(s).   Lanelle BalHarbrecht, Ciana Simmon, MD 09/15/2017, 8:28 AM Pager: Pager# [phone]

## 2017-09-15 NOTE — Discharge Instructions (Addendum)
[name], You were hospitalized for a worsening of your heart failure and pneumonia.   -For your pneumonia, please take azithromycin once daily until you have completed the 2 tablets left on 12/14 and cefdinir twice daily until you have finished all of the tablets on 12/14  - For your heart failure start taking Lasix 80 mg twice daily, take the second dose six hours after the first dose - start taking Coreg 12.5 mg twice daily ( you were previously taking 25 mg twice daily)  - please weight yourself daily and record the weights and bring them with you to your next office visit   - you will need to use   - please use the pink pads provided by your nurse to keep the wound on your buttocks protected   - We have scheduled a follow-up appointment for you in the clinic on April 17, please call our clinic if this appointment does not work for your schedule  Please call our clinic if you have any questions or concerns, we may be able to help and keep you from a long and expensive emergency room wait. Our clinic and after hours phone number is [phone], there is always someone available.    Information on my medicine - ELIQUIS (apixaban)  Why was Eliquis prescribed for you? Eliquis was prescribed for you to reduce the risk of a blood clot forming that can cause a stroke if you have a medical condition called atrial fibrillation (a type of irregular heartbeat).  What do You need to know about Eliquis ? Take your Eliquis TWICE DAILY - one tablet in the morning and one tablet in the evening with or without food. If you have difficulty swallowing the tablet whole please discuss with your pharmacist how to take the medication safely.  Take Eliquis exactly as prescribed by your doctor and DO NOT stop taking Eliquis without talking to the doctor who prescribed the medication.  Stopping may increase your risk of developing a stroke.  Refill your prescription before you run out.  After  discharge, you should have regular check-up appointments with your healthcare provider that is prescribing your Eliquis.  In the future your dose may need to be changed if your kidney function or weight changes by a significant amount or as you get older.  What do you do if you miss a dose? If you miss a dose, take it as soon as you remember on the same day and resume taking twice daily.  Do not take more than one dose of ELIQUIS at the same time to make up a missed dose.  Important Safety Information A possible side effect of Eliquis is bleeding. You should call your healthcare provider right away if you experience any of the following: ? Bleeding from an injury or your nose that does not stop. ? Unusual colored urine (red or dark brown) or unusual colored stools (red or black). ? Unusual bruising for unknown reasons. ? A serious fall or if you hit your head (even if there is no bleeding).  Some medicines may interact with Eliquis and might increase your risk of bleeding or clotting while on Eliquis. To help avoid this, consult your healthcare provider or pharmacist prior to using any new prescription or non-prescription medications, including herbals, vitamins, non-steroidal anti-inflammatory drugs (NSAIDs) and supplements.  This website has more information on Eliquis (apixaban): http://www.eliquis.com/eliquis/home

## 2017-09-16 LAB — BASIC METABOLIC PANEL
Anion gap: 9 (ref 5–15)
BUN: 39 mg/dL — AB (ref 6–20)
CHLORIDE: 105 mmol/L (ref 101–111)
CO2: 26 mmol/L (ref 22–32)
CREATININE: 1.79 mg/dL — AB (ref 0.44–1.00)
Calcium: 8.3 mg/dL — ABNORMAL LOW (ref 8.9–10.3)
GFR calc Af Amer: 31 mL/min — ABNORMAL LOW (ref >60)
GFR calc non Af Amer: 27 mL/min — ABNORMAL LOW (ref >60)
Glucose, Bld: 123 mg/dL — ABNORMAL HIGH (ref 65–99)
Potassium: 3.9 mmol/L (ref 3.5–5.1)
SODIUM: 140 mmol/L (ref 135–145)

## 2017-09-16 LAB — GLUCOSE, CAPILLARY
GLUCOSE-CAPILLARY: 149 mg/dL — AB (ref 65–99)
GLUCOSE-CAPILLARY: 121 mg/dL — AB (ref 65–99)
GLUCOSE-CAPILLARY: 160 mg/dL — AB (ref 65–99)
Glucose-Capillary: 148 mg/dL — ABNORMAL HIGH (ref 65–99)

## 2017-09-16 MED ORDER — CEPHALEXIN 500 MG PO CAPS
500.0000 mg | ORAL_CAPSULE | Freq: Two times a day (BID) | ORAL | Status: AC
  Administered 2017-09-16 – 2017-09-19 (×6): 500 mg via ORAL
  Filled 2017-09-16 (×6): qty 1

## 2017-09-16 MED ORDER — FUROSEMIDE 10 MG/ML IJ SOLN: 80.0000 mg | Freq: Every day | INTRAMUSCULAR | Status: DC

## 2017-09-16 MED ORDER — FUROSEMIDE 10 MG/ML IJ SOLN: 40.0000 mg | Freq: Two times a day (BID) | INTRAMUSCULAR | Status: DC

## 2017-09-16 MED ORDER — FUROSEMIDE 10 MG/ML IJ SOLN
80.0000 mg | Freq: Every day | INTRAMUSCULAR | Status: AC
  Administered 2017-09-16: 80 mg via INTRAVENOUS
  Filled 2017-09-16: qty 8

## 2017-09-16 MED ORDER — FUROSEMIDE 10 MG/ML IJ SOLN: 40.0000 mg | Freq: Every day | INTRAMUSCULAR | Status: DC

## 2017-09-16 MED ORDER — PREDNISONE 20 MG PO TABS
40.0000 mg | ORAL_TABLET | Freq: Every day | ORAL | Status: DC
  Administered 2017-09-17: 40 mg via ORAL
  Filled 2017-09-16: qty 2

## 2017-09-16 MED ORDER — RAMELTEON 8 MG PO TABS
8.0000 mg | ORAL_TABLET | Freq: Once | ORAL | Status: DC
  Filled 2017-09-16: qty 1

## 2017-09-16 MED ORDER — ALBUTEROL SULFATE (2.5 MG/3ML) 0.083% IN NEBU
2.5000 mg | INHALATION_SOLUTION | RESPIRATORY_TRACT | Status: DC | PRN
  Administered 2017-09-17 – 2017-09-21 (×4): 2.5 mg via RESPIRATORY_TRACT
  Filled 2017-09-16 (×4): qty 3

## 2017-09-16 NOTE — Progress Notes (Signed)
   Subjective: He continues to have shortness of breath overnight. She reports good urine output overnight. She did notice a new wheezing and nonproductive cough which began during the day yesterday.  Nebulizer treatments have been helping only somewhat with her breathing. She says that when she uses her albuterol inhaler at home for similar symptoms it is helpful. Her breathing becomes more difficult when she lies down, she is not able to get up and ambulate at this time. She is tolerating p.o. intake well.   Objective:  Vital signs in last 24 hours: Vitals:   09/16/17 1149 09/16/17 1246 09/16/17 1347 09/16/17 1530  BP:  132/64 122/72   Pulse:  61 62   Resp:   (!) 22   Temp:      TempSrc:      SpO2: 93%  97% 95%  Weight:      Height:       General: resting comfortably, no acute distress  Cardiac: Regular rate and rhythm, trace peripheral edema Pulm: Bibasilar rhonchi, expiratory wheezing diffuse, accessory muscle use  GI: soft, non distended, non tender   Assessment/Plan:  Acute respiratory failure with new oxygen requirement COPD exacerbation  New oxygen requirement now with wheezing and cough are concerning for an exacerbation of COPD.  Prior PFTs were from 2015 showed mild obstructive disease with moderate diffusion defect. She does seem to be using butyryl inhalers more frequently and I wonder if she has had a worsening of her COPD. -Start prednisone 40 mg daily for 5 days -Continue heart failure management as below    Acute CHF (congestive heart failure) (HCC) CKD stage 3  - still with bibasilar rhonchi, orthopnea, and increased O2 requirement  - weight is 317 lb, this is close to weight at her last office visit in November 314 - crt 1.79 today,  Baseline crt may be around 1.3 but this was last recorded in august 2018  - increase lasix to 80 mg this evening with plan to resume 40 mg  - continue I&O, daily weights     Cellulitis  - no erythema, warmth or signs of infection  today  - s/p 2 days treatment with ancef, will transition to keflex with plan to complete 5 day course     Weakness generalized -PT and OT recommends HH PT and OT vs SNF   Atrial flutter - continue home meds eliquis, amiodarone, and carvedilol  - can consider restarting home diltiazem and hydralazine if BP improves   Type 2 diabetes  Blood glucose controlled 120-160 over last day.  - continue SSI TID  - continue to monitor CBG    Dispo: Anticipated discharge in approximately 2-3 day(s).   Eulah PontBlum, Tomas Schamp, MD 09/16/2017, 5:41 PM Pager: [phone]

## 2017-09-16 NOTE — Progress Notes (Signed)
While bathing pt this morning, pt became extremely SOB while lying flat. Sats could not be maintained so pt placed on NRB. Pt belly breathing, crackles and audible wheezing noted. RT called to bedside. Breathing tx given. MD paged and came to see pt at bedside; see note. AM dose of IV lasix given early by MD request. Pt recovered after interventions, now resting in bed with HOB elevated. Will continue to monitor and assess.  Viviano SimasMorgan N Bonni Neuser, RN

## 2017-09-16 NOTE — Evaluation (Signed)
Physical Therapy Evaluation Patient Details Name: CATALYNA REILLY MRN: [id number] DOB: 09-16-1940 Today's Date: 09/16/2017   History of Present Illness  Syniah Berne is 77 yo F with PMHx of HTN, HLD, CAD, T2DM, CKDIV, A-fib, CHF (LV EF 55-60% in 2018, Hx of G1DD), chronic venous insufficiency, and morbid obesity who presented for evaluation of generalized weakness and bilateral lower extremity edema.  This appears to most likely be secondary to patient's acute CHF and chronic venous insufficiency.  Pt currently admit with acute on chronic CHF, recurrent falls, LLE cellulitis and afib.   Clinical Impression  Pt admitted with above diagnosis. Pt currently with functional limitations due to the deficits listed below (see PT Problem List). Pt was able to pivot to recliner with mod assist by PT with RW due to pt leanign heavily on RW and needing cues to sequence steps and RW.  Pt sats 100% on 4L on arrival with pt having breathing treztment.  Dropped sats to 80% once transferred to chair therefore incr O2 to 6L and sats 91-93%.  BP intiially 92/56 and then 117/85 once in chair.  Pt's lunch came and set up lunch for pt.   Pt will benefit from skilled PT to increase their independence and safety with mobility to allow discharge to the venue listed below.      Follow Up Recommendations Supervision/Assistance - 24 hour;Home health PT(recommend a short term SNF ultimately)    Equipment Recommendations  Wheelchair (measurements PT);Wheelchair cushion (measurements PT)    Recommendations for Other Services       Precautions / Restrictions Precautions Precautions: Fall Restrictions Weight Bearing Restrictions: No      Mobility  Bed Mobility Overal bed mobility: Needs Assistance Bed Mobility: Supine to Sit     Supine to sit: Mod assist     General bed mobility comments: assist for elevation of trunk and scooting to EOB. Pt purewick was not functioning properly as seal was not closed on suction  canister. This PT fixed this.    Transfers Overall transfer level: Needs assistance Equipment used: Rolling walker (2 wheeled) Transfers: Sit to/from [employer] Sit to Stand: Mod assist;From elevated surface Stand pivot transfers: Mod assist       General transfer comment: Pt was able to stand with mod assist to power up.  then pt needed mod assist to sequence steps and RW to pivot to the recliner. Very flexed at her trunk as well almost leaning on RW for support.   Ambulation/Gait             General Gait Details: unable  Stairs            Wheelchair Mobility    Modified Rankin (Stroke Patients Only)       Balance Overall balance assessment: Needs assistance;History of Falls Sitting-balance support: No upper extremity supported;Feet supported Sitting balance-Leahy Scale: Fair     Standing balance support: Bilateral upper extremity supported;During functional activity Standing balance-Leahy Scale: Poor Standing balance comment: relies heavily on UEs and cannot stand fully upright.  Needed mod assist for safety.                              Pertinent Vitals/Pain Pain Assessment: No/denies pain    Home Living Family/patient expects to be discharged to:: Private residence Living Arrangements: Spouse/significant other;Children Available Help at Discharge: Family;Available 24 hours/day(husband blind,lives w/ daughter, other daughter sits with he) Type of Home: House Home Access:  Stairs to enter Entrance Stairs-Rails: Right Entrance Stairs-Number of Steps: 3 Home Layout: One level Home Equipment: Walker - 2 wheels;Cane - quad;Walker - 4 wheels;Bedside commode;Shower seat      Prior Function Level of Independence: Independent with assistive device(s)         Comments: walked with rollator per pt report     Hand Dominance   Dominant Hand: Right    Extremity/Trunk Assessment   Upper Extremity Assessment Upper Extremity  Assessment: Defer to OT evaluation    Lower Extremity Assessment Lower Extremity Assessment: RLE deficits/detail;LLE deficits/detail RLE Deficits / Details: grossly 3-/5 LLE Deficits / Details: grossly 3-/5       Communication   Communication: No difficulties  Cognition Arousal/Alertness: Awake/alert Behavior During Therapy: WFL for tasks assessed/performed Overall Cognitive Status: Within Functional Limits for tasks assessed                                        General Comments General comments (skin integrity, edema, etc.): Purewick had leaked.  Cleaned pt and fixed the canister and it was working on departure.  Pt sats 100% on 4L on arrival with pt having breathing treztment.  Dropped sats to 80% once transferred to chair therefore incr O2 to 6L and sats 91-93%.  BP intiially 92/56 and then 117/85 once in chair.      Exercises     Assessment/Plan    PT Assessment Patient needs continued PT services  PT Problem List Decreased balance;Decreased activity tolerance;Decreased mobility;Decreased strength;Decreased knowledge of use of DME;Decreased safety awareness;Decreased knowledge of precautions;Cardiopulmonary status limiting activity;Obesity       PT Treatment Interventions DME instruction;Gait training;Functional mobility training;Therapeutic activities;Therapeutic exercise;Balance training;Patient/family education    PT Goals (Current goals can be found in the Care Plan section)  Acute Rehab PT Goals Patient Stated Goal: to go home PT Goal Formulation: With patient Time For Goal Achievement: 09/30/17 Potential to Achieve Goals: Good    Frequency Min 3X/week   Barriers to discharge        Co-evaluation               AM-PAC PT "6 Clicks" Daily Activity  Outcome Measure Difficulty turning over in bed (including adjusting bedclothes, sheets and blankets)?: Unable Difficulty moving from lying on back to sitting on the side of the bed? :  Unable Difficulty sitting down on and standing up from a chair with arms (e.g., wheelchair, bedside commode, etc,.)?: A Lot Help needed moving to and from a bed to chair (including a wheelchair)?: A Lot Help needed walking in hospital room?: Total Help needed climbing 3-5 steps with a railing? : Total 6 Click Score: 8    End of Session Equipment Utilized During Treatment: Gait belt;Oxygen Activity Tolerance: Patient limited by fatigue Patient left: in chair;with call bell/phone within reach Nurse Communication: Mobility status;Need for lift equipment PT Visit Diagnosis: Unsteadiness on feet (R26.81);Repeated falls (R29.6)    Time: [id number] PT Time Calculation (min) (ACUTE ONLY): 28 min   Charges:   PT Evaluation $PT Eval Moderate Complexity: 1 Mod PT Treatments $Therapeutic Activity: 8-22 mins   PT G Codes:        Jahniyah Revere,PT Acute Rehabilitation [phone] (pager)   Berline Lopesawn F Jashanti Clinkscale 09/16/2017, 2:44 PM

## 2017-09-16 NOTE — Evaluation (Signed)
Occupational Therapy Evaluation Patient Details Name: Jamie Mason MRN: [id number] DOB: [birth year] Today's Date: 09/16/2017    History of Present Illness Kalah Pflum is 77 yo F with PMHx of HTN, HLD, CAD, T2DM, CKDIV, A-fib, CHF (LV EF 55-60% in 2018, Hx of G1DD), chronic venous insufficiency, and morbid obesity who presented for evaluation of generalized weakness and bilateral lower extremity edema.  This appears to most likely be secondary to patient's acute CHF and chronic venous insufficiency.  Pt currently admit with acute on chronic CHF, recurrent falls, LLE cellulitis and afib.    Clinical Impression   PTA Pt reports that she was Mod I for ADL with shower seat, and Rolator for mobility. Pt is currently max to total A for LB ADL and mod +2 assist for safe transfers where Pt maintains a flexed position over RW despite cues. Pt's O2 saturations remained >90 with 6L for transfer. Pt will benefit from skilled OT in the acute setting as well as post-acute to maximize safety and independence in ADL and functional transfers. Pt will benefit from energy conservation education (please bring handout) while getting education for compensatory skills for LB ADL with AE.    Follow Up Recommendations  SNF;Other (comment)(Pt likely to refuse, and will need HHOT)    Equipment Recommendations  None recommended by OT(Pt has appropriate DME)    Recommendations for Other Services       Precautions / Restrictions Precautions Precautions: Fall Restrictions Weight Bearing Restrictions: No      Mobility Bed Mobility Overal bed mobility: Needs Assistance Bed Mobility: Sit to Supine     Supine to sit: Mod assist Sit to supine: Max assist;+2 for physical assistance;+2 for safety/equipment   General bed mobility comments: Pt requires total A for BLE back into bed, asssit for positioning in bed  Transfers Overall transfer level: Needs assistance Equipment used: Rolling walker (2  wheeled) Transfers: Sit to/from [employer] Sit to Stand: Mod assist;+2 physical assistance;+2 safety/equipment Stand pivot transfers: Mod assist;+2 safety/equipment;+2 physical assistance       General transfer comment: Pt was able to stand with mod +2 assist to power up. vc for safety with RW to push up from chair. then pt needed mod assist to sequence and for safety as Pt was flexed at trunk despite +2 assist and vc    Balance Overall balance assessment: Needs assistance;History of Falls Sitting-balance support: No upper extremity supported;Feet supported Sitting balance-Leahy Scale: Fair Sitting balance - Comments: close min guard for safety EOB   Standing balance support: Bilateral upper extremity supported;During functional activity Standing balance-Leahy Scale: Poor Standing balance comment: relies heavily on UEs and keeps trunk in flexion despite cues                           ADL either performed or assessed with clinical judgement   ADL Overall ADL's : Needs assistance/impaired Eating/Feeding: Set up;Sitting   Grooming: Set up;Sitting   Upper Body Bathing: Min guard   Lower Body Bathing: Maximal assistance   Upper Body Dressing : Minimal assistance Upper Body Dressing Details (indicate cue type and reason): to doff extra hospital gown Lower Body Dressing: Total assistance   Toilet Transfer: Moderate assistance;+2 for physical assistance;+2 for safety/equipment;Stand-pivot;RW   Toileting- Clothing Manipulation and Hygiene: Total assistance Toileting - Clothing Manipulation Details (indicate cue type and reason): unable to maintain standing safely for peri care     Functional mobility during ADLs: Moderate assistance;+2 for  safety/equipment;+2 for physical assistance;Rolling walker(SPT only this session)       Vision Patient Visual Report: No change from baseline       Perception     Praxis      Pertinent Vitals/Pain Pain  Assessment: No/denies pain     Hand Dominance Right   Extremity/Trunk Assessment Upper Extremity Assessment Upper Extremity Assessment: Generalized weakness   Lower Extremity Assessment Lower Extremity Assessment: Defer to PT evaluation RLE Deficits / Details: grossly 3-/5 LLE Deficits / Details: grossly 3-/5   Cervical / Trunk Assessment Cervical / Trunk Assessment: Other exceptions Cervical / Trunk Exceptions: morbid obesity   Communication Communication Communication: No difficulties   Cognition Arousal/Alertness: Lethargic Behavior During Therapy: WFL for tasks assessed/performed Overall Cognitive Status: Within Functional Limits for tasks assessed                                 General Comments: Pt very sleepy, easy to arouse but trouble staying awake   General Comments  VSS throughout transfer (Pt at 6L O2 for transfer, and then turned back down to 4L and O2 remained appropriate)    Exercises     Shoulder Instructions      Home Living Family/patient expects to be discharged to:: Private residence Living Arrangements: Spouse/significant other;Children Available Help at Discharge: Family;Available 24 hours/day(husband blind,lives w/ daughter, other daughter sits with he) Type of Home: House Home Access: Stairs to enter [employer] CorporationEntrance Stairs-Number of Steps: 3 Entrance Stairs-Rails: Right Home Layout: One level     Bathroom Shower/Tub: Chief Strategy OfficerTub/shower unit   Bathroom Toilet: Standard     Home Equipment: Environmental consultantWalker - 2 wheels;Cane - quad;Walker - 4 wheels;Bedside commode;Shower seat          Prior Functioning/Environment Level of Independence: Independent with assistive device(s)        Comments: walked with rollator per pt report - sits to bathe        OT Problem List: Decreased activity tolerance;Impaired balance (sitting and/or standing);Decreased safety awareness;Cardiopulmonary status limiting activity;Obesity;Increased edema      OT  Treatment/Interventions: Self-care/ADL training;Energy conservation;DME and/or AE instruction;Therapeutic activities;Patient/family education;Balance training    OT Goals(Current goals can be found in the care plan section) Acute Rehab OT Goals Patient Stated Goal: to go home OT Goal Formulation: With patient Time For Goal Achievement: 09/30/17 Potential to Achieve Goals: Good  OT Frequency: Min 2X/week   Barriers to D/C:    Pt's husband is blind - daughter also lives with them       Co-evaluation              AM-PAC PT "6 Clicks" Daily Activity     Outcome Measure Help from another person eating meals?: None Help from another person taking care of personal grooming?: A Little Help from another person toileting, which includes using toliet, bedpan, or urinal?: A Lot Help from another person bathing (including washing, rinsing, drying)?: A Lot Help from another person to put on and taking off regular upper body clothing?: A Little Help from another person to put on and taking off regular lower body clothing?: Total 6 Click Score: 15   End of Session Equipment Utilized During Treatment: Gait belt;Rolling walker;Oxygen(6L) Nurse Communication: Mobility status  Activity Tolerance: Patient limited by fatigue Patient left: in bed;with call bell/phone within reach;with bed alarm set  OT Visit Diagnosis: Unsteadiness on feet (R26.81);Other abnormalities of gait and mobility (R26.89);Muscle weakness (generalized) (M62.81)  Time: 9562-1308 OT Time Calculation (min): 25 min Charges:  OT General Charges $OT Visit: 1 Visit OT Evaluation $OT Eval Moderate Complexity: 1 Mod OT Treatments $Self Care/Home Management : 8-22 mins G-Codes:     Sherryl Manges OTR/L [phone]  Evern Bio Kryssa Risenhoover 09/16/2017, 4:52 PM

## 2017-09-16 NOTE — Progress Notes (Signed)
PHARMACY NOTE:  ANTIMICROBIAL RENAL DOSAGE ADJUSTMENT  Current antimicrobial regimen includes a mismatch between antimicrobial dosage and estimated renal function.  As per policy approved by the Pharmacy & Therapeutics and Medical Executive Committees, the antimicrobial dosage will be adjusted accordingly.  Current antimicrobial dosage:  Keflex 250mg  po q8h  Indication: cellulitis  Renal Function:  Estimated Creatinine Clearance: 40 mL/min (A) (by C-G formula based on SCr of 1.79 mg/dL (H)).  Antimicrobial dosage has been changed to:  Keflex 500mg  po q12h  Additional comments:   Thank you for allowing pharmacy to be a part of this patient's care.  Benny LennertMeyer, Javaun Dimperio David, [facility] 09/16/2017 6:13 PM

## 2017-09-16 NOTE — Progress Notes (Signed)
Paged by RN about respiratory distress that developed while patient laying flat this AM. Per RN report patient became hypoxic, required nonrebreather to maintain O2 sats >90%.   When we arrived to the room patient laying comfortably in bed with 3L [place] in place, oxygen saturations >90%, after just receiving breathing treatment.  CV: RRR, no murmurs. Pulm: Some accessory muscle use, mild belly breathing. Bilateral crackles with audible upper air way wheezing.  A/P: Patient dyspneic while laying flat, but breathing improved when repositioned upright in bed and given breathing treatment. Continued signs of volume overload on exam, with bilateral crackles. Gave AM dose of IV 40 lasix early and plan to have day team reevaluate patient after this AM dose to see if increased dose of lasix 80 mg in PM would be beneficial.

## 2017-09-17 ENCOUNTER — Inpatient Hospital Stay (HOSPITAL_COMMUNITY): Payer: Medicare HMO

## 2017-09-17 DIAGNOSIS — I361 Nonrheumatic tricuspid (valve) insufficiency: Secondary | ICD-10-CM

## 2017-09-17 DIAGNOSIS — E1122 Type 2 diabetes mellitus with diabetic chronic kidney disease: Secondary | ICD-10-CM

## 2017-09-17 DIAGNOSIS — I130 Hypertensive heart and chronic kidney disease with heart failure and stage 1 through stage 4 chronic kidney disease, or unspecified chronic kidney disease: Principal | ICD-10-CM

## 2017-09-17 DIAGNOSIS — L0390 Cellulitis, unspecified: Secondary | ICD-10-CM

## 2017-09-17 DIAGNOSIS — J9600 Acute respiratory failure, unspecified whether with hypoxia or hypercapnia: Secondary | ICD-10-CM

## 2017-09-17 DIAGNOSIS — I4892 Unspecified atrial flutter: Secondary | ICD-10-CM

## 2017-09-17 DIAGNOSIS — I5033 Acute on chronic diastolic (congestive) heart failure: Secondary | ICD-10-CM

## 2017-09-17 DIAGNOSIS — N183 Chronic kidney disease, stage 3 (moderate): Secondary | ICD-10-CM

## 2017-09-17 DIAGNOSIS — Z7901 Long term (current) use of anticoagulants: Secondary | ICD-10-CM

## 2017-09-17 DIAGNOSIS — Z79899 Other long term (current) drug therapy: Secondary | ICD-10-CM

## 2017-09-17 DIAGNOSIS — R531 Weakness: Secondary | ICD-10-CM

## 2017-09-17 DIAGNOSIS — D649 Anemia, unspecified: Secondary | ICD-10-CM

## 2017-09-17 LAB — TROPONIN I
TROPONIN I: 0.35 ng/mL — AB (ref <0.03)
TROPONIN I: 0.22 ng/mL — AB (ref <0.03)

## 2017-09-17 LAB — CBC
HCT: 23.4 % — ABNORMAL LOW (ref 36.0–46.0)
Hemoglobin: 7.4 g/dL — ABNORMAL LOW (ref 12.0–15.0)
MCH: 26.3 pg (ref 26.0–34.0)
MCHC: 31.6 g/dL (ref 30.0–36.0)
MCV: 83.3 fL (ref 78.0–100.0)
PLATELETS: 405 10*3/uL — AB (ref 150–400)
RBC: 2.81 MIL/uL — ABNORMAL LOW (ref 3.87–5.11)
RDW: 15.5 % (ref 11.5–15.5)
WBC: 13.2 10*3/uL — AB (ref 4.0–10.5)

## 2017-09-17 LAB — BASIC METABOLIC PANEL
ANION GAP: 12 (ref 5–15)
BUN: 36 mg/dL — ABNORMAL HIGH (ref 6–20)
CALCIUM: 8.4 mg/dL — AB (ref 8.9–10.3)
CO2: 26 mmol/L (ref 22–32)
Chloride: 103 mmol/L (ref 101–111)
Creatinine, Ser: 1.81 mg/dL — ABNORMAL HIGH (ref 0.44–1.00)
GFR, EST AFRICAN AMERICAN: 31 mL/min — AB (ref >60)
GFR, EST NON AFRICAN AMERICAN: 26 mL/min — AB (ref >60)
Glucose, Bld: 134 mg/dL — ABNORMAL HIGH (ref 65–99)
Potassium: 3.8 mmol/L (ref 3.5–5.1)
Sodium: 141 mmol/L (ref 135–145)

## 2017-09-17 LAB — GLUCOSE, CAPILLARY
GLUCOSE-CAPILLARY: 127 mg/dL — AB (ref 65–99)
Glucose-Capillary: 174 mg/dL — ABNORMAL HIGH (ref 65–99)
Glucose-Capillary: 258 mg/dL — ABNORMAL HIGH (ref 65–99)
Glucose-Capillary: 232 mg/dL — ABNORMAL HIGH (ref 65–99)

## 2017-09-17 LAB — ECHOCARDIOGRAM COMPLETE
Height: 65 in
WEIGHTICAEL: 5185.22 [oz_av]

## 2017-09-17 MED ORDER — FUROSEMIDE 10 MG/ML IJ SOLN
100.0000 mg | Freq: Two times a day (BID) | INTRAVENOUS | Status: DC
  Filled 2017-09-17: qty 10

## 2017-09-17 MED ORDER — FUROSEMIDE 10 MG/ML IJ SOLN
120.0000 mg | Freq: Two times a day (BID) | INTRAVENOUS | Status: DC
  Administered 2017-09-17 – 2017-09-19 (×5): 120 mg via INTRAVENOUS
  Filled 2017-09-17: qty 10
  Filled 2017-09-17: qty 12
  Filled 2017-09-17 (×2): qty 10
  Filled 2017-09-17: qty 12
  Filled 2017-09-17 (×2): qty 10

## 2017-09-17 MED ORDER — FUROSEMIDE 10 MG/ML IJ SOLN
80.0000 mg | Freq: Every day | INTRAMUSCULAR | Status: AC
  Administered 2017-09-17: 80 mg via INTRAVENOUS
  Filled 2017-09-17: qty 8

## 2017-09-17 MED ORDER — DIPHENHYDRAMINE HCL 25 MG PO CAPS
25.0000 mg | ORAL_CAPSULE | Freq: Once | ORAL | Status: AC
  Administered 2017-09-17: 25 mg via ORAL
  Filled 2017-09-17: qty 1

## 2017-09-17 MED ORDER — FUROSEMIDE 10 MG/ML IJ SOLN
120.0000 mg | Freq: Two times a day (BID) | INTRAVENOUS | Status: DC
  Filled 2017-09-17: qty 12

## 2017-09-17 NOTE — Progress Notes (Signed)
Weighed patient on standing scale with gown and socks on.  Patient weighed 145kg (319lb 10.7 oz).  Assisted patient back to bed.  Re-weighed with the appropriate amount of linen on the bed.  Patient weighed 144.6kg (318lb 12.6 oz.).

## 2017-09-17 NOTE — Progress Notes (Signed)
  Echocardiogram 2D Echocardiogram has been performed.  Jamie Mason 09/17/2017, 4:29 PM

## 2017-09-17 NOTE — Progress Notes (Addendum)
Weaned O2 from [facility]6LNC to [facility]4LNC.  Sats remain 93% - 98%.  Will attempt to obtain a standing weight later this afternoon.  Accurately measuring intake/output this shift.  Purewick in place, as patient is incontinent of urine.  Patient has only had one cup of water and tea today.  Will continue to closely monitor.

## 2017-09-17 NOTE — Progress Notes (Signed)
Internal Medicine Attending:   I saw and examined the patient. I reviewed the resident's note and I agree with the resident's findings and plan as documented in the resident's note.  Patient still complains of persistent shortness of breath.  Patient was admitted for acute on chronic diastolic CHF exacerbation.  Repeat chest x-ray done today showed worsening pulmonary edema from the time of admission.  Ins and outs have not been accurately recorded over the last 48 hours and it is hard to tell what her response to the diuretic dose has been but given her worsening pulmonary edema will increase her Lasix dose to 120 mg IV twice daily for now.  Will need strict ins and outs.  We will follow-up 2D echo.  Patient's creatinine has improved to 1.8 from admission creatinine of 2.2.  We will continue to monitor her creatinine closely with aggressive diuresis.  Will also need to monitor her electrolyte levels with diuresis.  We will continue to monitor closely.

## 2017-09-17 NOTE — Progress Notes (Addendum)
   Subjective: [name] is still feeling short of breath. She denies chest pain, nausea, palpitations, or diaphoresis overnight. She reports good urine output overnight.   Objective:  Vital signs in last 24 hours: Vitals:   09/17/17 0747 09/17/17 0855 09/17/17 1000 09/17/17 1242  BP:      Pulse: (!) 58  (!) 54   Resp: 16  14   Temp:      TempSrc:      SpO2: 97% 93% 95% 94%  Weight:      Height:       General: no acute distress  Cardiac: Regular rate and rhythm, trace peripheral edema, not able to appreciate jugular veins  Pulm: increased work of breathing with accessory muscle use, Bibasilar rhonchi GI: soft, non distended, non tender   Assessment/Plan:  Acute respiratory failure with new oxygen requirement   Acute CHF (congestive heart failure) (HCC) CKD stage 3  New oxygen requirement and increased work of breathing and orthopnea. Denies cough.  -Chest xray today with worsening of pulmonary edema from the time of admission.  - weight this morning 323, increased from 317 recorded yesterday, this is increased from 317 at the time of last office visit  - output not recorded yesterday, about 600 cc of urine output with IV 80 mg lasix this morning  - crt 1.8 today,  Baseline crt may be around 1.3 but this was last recorded in august 2018  - increase lasix to 120 mg BID  - continue strict Intake and output, daily weights - echocardiogram ordered     Cellulitis  - no erythema, warmth or signs of infection today  - s/p 2 days treatment with ancef, transitioned to keflex yesterday, tomorrow she will have completed a 5 day course    Weakness generalized -PT and OT recommends HH PT and OT vs SNF   Atrial flutter - continue home meds eliquis, amiodarone, and carvedilol  - can consider restarting home diltiazem if BP improves   Type 2 diabetes  Blood glucose controlled 120-160 over last day.  - continue SSI TID  - continue to monitor CBG   Anemia  - continue to follow  hemoglobin   Hypertension  - holding home hydralazine for low normal blood pressure   Dispo: Anticipated discharge in approximately 2-3 day(s).   Jamie Mason, Jamie Burtt, MD 09/17/2017, 1:23 PM Pager: [phone]

## 2017-09-18 DIAGNOSIS — Z794 Long term (current) use of insulin: Secondary | ICD-10-CM

## 2017-09-18 DIAGNOSIS — J9621 Acute and chronic respiratory failure with hypoxia: Secondary | ICD-10-CM

## 2017-09-18 DIAGNOSIS — L03115 Cellulitis of right lower limb: Secondary | ICD-10-CM

## 2017-09-18 LAB — CBC
HCT: 23 % — ABNORMAL LOW (ref 36.0–46.0)
HEMOGLOBIN: 7.6 g/dL — AB (ref 12.0–15.0)
MCH: 27.6 pg (ref 26.0–34.0)
MCHC: 33 g/dL (ref 30.0–36.0)
MCV: 83.6 fL (ref 78.0–100.0)
Platelets: 442 10*3/uL — ABNORMAL HIGH (ref 150–400)
RBC: 2.75 MIL/uL — AB (ref 3.87–5.11)
RDW: 15.7 % — ABNORMAL HIGH (ref 11.5–15.5)
WBC: 16 10*3/uL — AB (ref 4.0–10.5)

## 2017-09-18 LAB — GLUCOSE, CAPILLARY
GLUCOSE-CAPILLARY: 175 mg/dL — AB (ref 65–99)
GLUCOSE-CAPILLARY: 136 mg/dL — AB (ref 65–99)
GLUCOSE-CAPILLARY: 134 mg/dL — AB (ref 65–99)
GLUCOSE-CAPILLARY: 125 mg/dL — AB (ref 65–99)

## 2017-09-18 LAB — BASIC METABOLIC PANEL
ANION GAP: 11 (ref 5–15)
BUN: 41 mg/dL — ABNORMAL HIGH (ref 6–20)
CHLORIDE: 101 mmol/L (ref 101–111)
CO2: 26 mmol/L (ref 22–32)
Calcium: 8.4 mg/dL — ABNORMAL LOW (ref 8.9–10.3)
Creatinine, Ser: 1.9 mg/dL — ABNORMAL HIGH (ref 0.44–1.00)
GFR calc non Af Amer: 25 mL/min — ABNORMAL LOW (ref >60)
GFR, EST AFRICAN AMERICAN: 29 mL/min — AB (ref >60)
Glucose, Bld: 214 mg/dL — ABNORMAL HIGH (ref 65–99)
POTASSIUM: 4 mmol/L (ref 3.5–5.1)
SODIUM: 138 mmol/L (ref 135–145)

## 2017-09-18 MED ORDER — IPRATROPIUM-ALBUTEROL 0.5-2.5 (3) MG/3ML IN SOLN
3.0000 mL | Freq: Two times a day (BID) | RESPIRATORY_TRACT | Status: DC
  Administered 2017-09-18 – 2017-09-21 (×7): 3 mL via RESPIRATORY_TRACT
  Filled 2017-09-18 (×7): qty 3

## 2017-09-18 MED ORDER — METFORMIN HCL 500 MG PO TABS
500.0000 mg | ORAL_TABLET | Freq: Two times a day (BID) | ORAL | Status: DC
  Administered 2017-09-18 – 2017-09-22 (×10): 500 mg via ORAL
  Filled 2017-09-18 (×10): qty 1

## 2017-09-18 NOTE — Progress Notes (Signed)
Subjective: The patient was resting comfortably in her bed today upon entering the room. She stated that she was feeling somewhat better today and was unaware as to why she was brought to the hospital to begin with. She stated that she feels well today but would like to get up and begin walking if possible.   Objective:  Vital signs in last 24 hours: Vitals:   09/18/17 0035 09/18/17 0445 09/18/17 0448 09/18/17 0734  BP: (!) 110/58  (!) 147/38 (!) 150/69  Pulse: 66  (!) 53 (!) 57  Resp: 20  14 (!) 22  Temp:    98.6 F (37 C)  TempSrc:    Oral  SpO2: 92%  93% 94%  Weight:  (!) 319 lb 3.6 oz (144.8 kg)    Height:       ROS negative except as per HPI.  Physical Exam: General: In no acute distress, resting in her bed comfortably.  Neuro: Alert oriented x4, no focal deficits Cardiovascular: RRR, no murmurs rubs or gallops auscultated Pulmonary: Bilateral lung fields with diminished breath sounds and crackles in the lower lung fields GI: Abdomen is soft, nontender, nondistended Musculoskeletal: Bilateral pitting edema is absent.  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  This appears to most likely be secondary to patient's acute CHF and chronic venous insufficiency.  We will continue to dialyze the patient and monitor I/O's while simultaneously treating the soft tissue infection with topical and systemic therapy.  Plan: Acute on chronicHFpEF with an LVEF of 55-60%  in 2018, G1DD. Repeat echocardiogram this diminished admission demonstrated an LVEF of 60-65% with grade 2 diastolic dysfunction. -Continue IV Lasix 120 mg twice daily -Strict I's and O's with proper daily weights appreciated  --Continue Carvedilol 25mg  daily --PT/OT for weakness  Cellulitis: Non-erythematous, non-edematous bilateral lower extremities. Soft tissue infection improving. We will discontinue her antibiotics on 09/19/2017 following her first dose that day to complete a 5 day course of antibiotics.  CKD III-IV: Most recent GFR 29, borderline CKD stage III-IV. Will continue to follow Cr/GFR while aggressively diuresing.  --BMP daily  Insulin independent diabetes: Continue Metformin at her home dose 500mg  BID Will discontinue SSI given her stable CBG recordings and discontinuation of prednisone.  --Continue CBG TID wc and QHS for now until a stable trend is noted. --Continue Atorvastatin 40mg  daily  Atrial Flutter: No evidence of arrhythmia on overnight telemetry.  --Continue amiodarone, carvedilol, eliquis at her home dose  Anemia:  Continue to trend daily. Will repeat CBC in am. Hgb 7.4 decreased again from the prior day. No active bleeding noted or obvious signs of external bleeding.  --CBC ordered for this afternoon to assess Hgb given the gradual decline since admission --CBC ordered for am  HTN: Currently well controlled at 115/56. We will continue to monitor.  Diet: Heart Healthy Code: Do not intubate, full code otherwise Fluids: n/a VTE ppx: continue home eliquis Dispo: Anticipated discharge in approximately 1-2 day(s).   Lanelle BalHarbrecht, Breindel Collier,  MD 09/18/2017, 7:35 AM Pager: Pager# [phone]

## 2017-09-18 NOTE — Progress Notes (Signed)
Physical Therapy Treatment Patient Details Name: Jamie Mason MRN: [id number] DOB: 1941/01/21 Today's Date: 09/18/2017    History of Present Illness Jamie Mason is 77 yo F with PMHx of HTN, HLD, CAD, T2DM, CKDIV, A-fib, CHF (LV EF 55-60% in 2018, Hx of G1DD), chronic venous insufficiency, and morbid obesity who presented for evaluation of generalized weakness and bilateral lower extremity edema.  This appears to most likely be secondary to patient's acute CHF and chronic venous insufficiency.  Pt currently admit with acute on chronic CHF, recurrent falls, LLE cellulitis and afib.     PT Comments    Pt starting to show good improvement,  Still quick to fatigue.  Emphasis on standing activity and gait in the RW.    Follow Up Recommendations  Supervision/Assistance - 24 hour;Home health PT     Equipment Recommendations  Wheelchair (measurements PT);Wheelchair cushion (measurements PT);Other (comment)(TBA further)    Recommendations for Other Services       Precautions / Restrictions Precautions Precautions: Fall    Mobility  Bed Mobility Overal bed mobility: Needs Assistance Bed Mobility: Supine to Sit     Supine to sit: Mod assist     General bed mobility comments: pt moved her legs to EOB, then assist to bring trunk forward up onto R elbow  Transfers Overall transfer level: Needs assistance Equipment used: Rolling walker (2 wheeled) Transfers: Sit to/from Stand Sit to Stand: Min assist;+2 physical assistance;+2 safety/equipment         General transfer comment: pt used UE's well to help bring herself forward with assist  Ambulation/Gait Ambulation/Gait assistance: Min assist;+2 safety/equipment Ambulation Distance (Feet): 7 Feet(then 8 feet after rest) Assistive device: Rolling walker (2 wheeled) Gait Pattern/deviations: Step-through pattern;Decreased step length - right;Decreased step length - left;Decreased stride length   Gait velocity interpretation: Below  normal speed for age/gender General Gait Details: Short, effortful steps, with moderate use of the RW.  HR in the 50's and 60's and sats at 95% or better on 3L [place]   Stairs            Wheelchair Mobility    Modified Rankin (Stroke Patients Only)       Balance Overall balance assessment: Needs assistance Sitting-balance support: No upper extremity supported Sitting balance-Leahy Scale: Fair       Standing balance-Leahy Scale: Poor Standing balance comment: moderate reliance on UE's on the RW, pt able to stand more erect to ambulate today.                            Cognition Arousal/Alertness: Awake/alert Behavior During Therapy: WFL for tasks assessed/performed Overall Cognitive Status: Within Functional Limits for tasks assessed                                        Exercises      General Comments        Pertinent Vitals/Pain Pain Assessment: Faces Faces Pain Scale: No hurt    Home Living                      Prior Function            PT Goals (current goals can now be found in the care plan section) Acute Rehab PT Goals Patient Stated Goal: to go home PT Goal Formulation: With patient Time For Goal  Achievement: 09/30/17 Potential to Achieve Goals: Good Progress towards PT goals: Progressing toward goals    Frequency    Min 3X/week      PT Plan Current plan remains appropriate    Co-evaluation              AM-PAC PT "6 Clicks" Daily Activity  Outcome Measure  Difficulty turning over in bed (including adjusting bedclothes, sheets and blankets)?: Unable Difficulty moving from lying on back to sitting on the side of the bed? : Unable Difficulty sitting down on and standing up from a chair with arms (e.g., wheelchair, bedside commode, etc,.)?: Unable Help needed moving to and from a bed to chair (including a wheelchair)?: A Little Help needed walking in hospital room?: A Little Help needed climbing  3-5 steps with a railing? : A Lot 6 Click Score: 11    End of Session Equipment Utilized During Treatment: Oxygen Activity Tolerance: Patient limited by fatigue Patient left: in chair;with call bell/phone within reach Nurse Communication: Mobility status PT Visit Diagnosis: Unsteadiness on feet (R26.81);Repeated falls (R29.6);Muscle weakness (generalized) (M62.81)     Time: 9147-82951143-1207 PT Time Calculation (min) (ACUTE ONLY): 24 min  Charges:  $Gait Training: 8-22 mins $Therapeutic Activity: 8-22 mins                    G Codes:       09/18/2017  [place] BingKen Shavonne Ambroise, PT [phone]  (pager)   Eliseo GumKenneth V Nimra Puccinelli 09/18/2017, 12:41 PM

## 2017-09-18 NOTE — Progress Notes (Signed)
   I saw and examined the patient. I reviewed the resident's note and I agree with the resident's findings and plan as documented in the resident's note.  Patient admitted with acute on chronic hypoxic respiratory failure, still requiring 4 L of supplemental oxygen via nasal cannula and still dyspneic with minimal exertion.  Most likely coming from acute on chronic heart failure with preserved ejection fraction given echo findings.  Volume assessment is difficult in her case because of severe obesity I agree with continuing diuresis because she still has a significant supplemental oxygen requirement.  We will need to monitor creatinine very closely.  Weights are essentially unchanged.  I agree with checking respiratory viral panel to rule out viral pneumonia.   Tyson AliasVincent, Rosell Khouri Thomas, MD 09/18/2017, 2:35 PM

## 2017-09-18 NOTE — Progress Notes (Signed)
Results for [name], Sheron C (MRN [id number]) as of 09/18/2017 13:33  Ref. Range 09/17/2017 11:23 09/17/2017 16:29 09/17/2017 21:42 09/18/2017 07:28 09/18/2017 12:18  Glucose-Capillary Latest Ref Range: 65 - 99 mg/dL 409174 (H) 811258 (H) 914232 (H) 175 (H) 136 (H)  Noted that patient has been on Metformin at home. Patient does have a GFR of 29 and Stage III-IV CKD. Should patient continue on Metformin due to these readings? Will continue to monitor blood sugars while in the hospital.  Smith MinceKendra Logan Vegh RN BSN CDE Diabetes Coordinator Pager: [phone]  8am-5pm

## 2017-09-19 ENCOUNTER — Ambulatory Visit: Payer: Medicare HMO | Admitting: Sports Medicine

## 2017-09-19 ENCOUNTER — Inpatient Hospital Stay (HOSPITAL_COMMUNITY): Payer: Medicare HMO

## 2017-09-19 DIAGNOSIS — D631 Anemia in chronic kidney disease: Secondary | ICD-10-CM

## 2017-09-19 LAB — CBC
HCT: 21 % — ABNORMAL LOW (ref 36.0–46.0)
Hemoglobin: 6.9 g/dL — CL (ref 12.0–15.0)
MCH: 27.1 pg (ref 26.0–34.0)
MCHC: 32.9 g/dL (ref 30.0–36.0)
MCV: 82.4 fL (ref 78.0–100.0)
PLATELETS: 432 10*3/uL — AB (ref 150–400)
RBC: 2.55 MIL/uL — ABNORMAL LOW (ref 3.87–5.11)
RDW: 15.9 % — AB (ref 11.5–15.5)
WBC: 12.7 10*3/uL — AB (ref 4.0–10.5)

## 2017-09-19 LAB — RETICULOCYTES
RBC.: 2.64 MIL/uL — ABNORMAL LOW (ref 3.87–5.11)
Retic Count, Absolute: 71.3 K/uL (ref 19.0–186.0)
Retic Ct Pct: 2.7 % (ref 0.4–3.1)

## 2017-09-19 LAB — BASIC METABOLIC PANEL
Anion gap: 10 (ref 5–15)
BUN: 40 mg/dL — ABNORMAL HIGH (ref 6–20)
CALCIUM: 8.5 mg/dL — AB (ref 8.9–10.3)
CO2: 26 mmol/L (ref 22–32)
CREATININE: 1.82 mg/dL — AB (ref 0.44–1.00)
Chloride: 106 mmol/L (ref 101–111)
GFR calc non Af Amer: 26 mL/min — ABNORMAL LOW (ref >60)
GFR, EST AFRICAN AMERICAN: 30 mL/min — AB (ref >60)
Glucose, Bld: 125 mg/dL — ABNORMAL HIGH (ref 65–99)
Potassium: 3.7 mmol/L (ref 3.5–5.1)
SODIUM: 142 mmol/L (ref 135–145)

## 2017-09-19 LAB — RESPIRATORY PANEL BY PCR
Adenovirus: NOT DETECTED
BORDETELLA PERTUSSIS-RVPCR: NOT DETECTED
Chlamydophila pneumoniae: NOT DETECTED
Coronavirus 229E: NOT DETECTED
Coronavirus HKU1: NOT DETECTED
Coronavirus NL63: NOT DETECTED
Coronavirus OC43: NOT DETECTED
INFLUENZA A-RVPPCR: NOT DETECTED
INFLUENZA B-RVPPCR: NOT DETECTED
Metapneumovirus: NOT DETECTED
Mycoplasma pneumoniae: NOT DETECTED
PARAINFLUENZA VIRUS 3-RVPPCR: NOT DETECTED
PARAINFLUENZA VIRUS 4-RVPPCR: NOT DETECTED
Parainfluenza Virus 1: NOT DETECTED
Parainfluenza Virus 2: NOT DETECTED
RESPIRATORY SYNCYTIAL VIRUS-RVPPCR: NOT DETECTED
RHINOVIRUS / ENTEROVIRUS - RVPPCR: NOT DETECTED

## 2017-09-19 LAB — CULTURE, BLOOD (ROUTINE X 2)
CULTURE: NO GROWTH
CULTURE: NO GROWTH
Special Requests: ADEQUATE

## 2017-09-19 LAB — GLUCOSE, CAPILLARY
GLUCOSE-CAPILLARY: 133 mg/dL — AB (ref 65–99)
Glucose-Capillary: 122 mg/dL — ABNORMAL HIGH (ref 65–99)
Glucose-Capillary: 114 mg/dL — ABNORMAL HIGH (ref 65–99)
Glucose-Capillary: 110 mg/dL — ABNORMAL HIGH (ref 65–99)

## 2017-09-19 LAB — PREPARE RBC (CROSSMATCH)

## 2017-09-19 LAB — HEMOGLOBIN AND HEMATOCRIT, BLOOD
HEMATOCRIT: 26.3 % — AB (ref 36.0–46.0)
HEMOGLOBIN: 8.3 g/dL — AB (ref 12.0–15.0)

## 2017-09-19 MED ORDER — SODIUM CHLORIDE 0.9 % IV SOLN
Freq: Once | INTRAVENOUS | Status: AC
  Administered 2017-09-19: 15:00:00 via INTRAVENOUS

## 2017-09-19 NOTE — Progress Notes (Signed)
PRN neb treatment given for mild increase WOB/SOB. RN at bedside

## 2017-09-19 NOTE — Progress Notes (Signed)
Completed 1 unit of RBC without any reaction.  Hinton DyerYoko Brailee Riede, RN

## 2017-09-19 NOTE — Progress Notes (Signed)
CRITICAL VALUE ALERT  Critical Value: Hemoglobin 6.9  Date & Time Notied:  09/19/2016 0834  Provider Notified: Dr. Crista ElliotHarbrecht  Orders Received/Actions taken: PRBC 1 unit

## 2017-09-19 NOTE — Consult Note (Signed)
   [facility]   09/19/2017  Jamie Mason 01/06/1941 [id number]    Received [facility] from [facility].   Went to bedside to speak with Jamie Mason at bedside to discuss and offer [facility] services. She was agreeable and [facility] written consent obtained. [facility] provided.   Jamie Mason endorses she lives with her husband ( who is blind) and her daughter Jamie Layman(Fornestine).  Confirmed Primary Care MD is Dr. Rogelia BogaButcher.  [facility]. States she has transportation issues to MD appointments. Discussed [employer] can provide transportation to MD appointments several times. She does not weigh daily. Reports she is unable to stand on scale. States she has recent falls and weakness.   Patient states she does not want to go to SNF despite her weakness. States " my daughter would want me to go to rehab".  Jamie Mason states her daughter Jamie Mason is at home with her during the day until her other daughter Jamie LaymanFornestine comes home. Reports her husband is blind.   D[facility]ed [facility] follow up for CHF management.   [facility] RN[facility].  Made inpatient RNCM aware [facility] will follow.    Raiford NobleAtika Hall, MSN-Ed, RN,BSN [facility] Care [facility] Hospital Liaison [phone]

## 2017-09-19 NOTE — Progress Notes (Signed)
    Subjective: Patient reports persistent dyspnea today.  Able to ambulate to the door of her room and back with dyspnea.  Becoming dyspneic with motion in bed as well.  Struggling with free water restriction.  Continues to have nonproductive cough.  Denies blood in stool, no hematuria.  Exam:   Blood pressure (!) 124/46, pulse (!) 58, temperature 99.6 F (37.6 C), temperature source Oral, resp. rate 17, height 5\' 5"  (1.651 m), weight (!) 317 lb 7.4 oz (144 kg), SpO2 93 %. Gen: Chronically ill-appearing woman lying in bed in no distress. Lungs: distant sounds, crackles at the bases Ext: Chronic stasis changes, 1+ nonpitting edema, left leg wrapped in gauze with no drainage  Assessment: 77 year old woman admitted with acute hypoxic respiratory failure and bilateral pulmonary edema not responding significantly so far to current treatment.  Acute hypoxic respiratory failure: Chest x-ray with bilateral pulmonary opacities.  Working diagnosis is acute on chronic heart failure with preserved ejection fraction with grade 2 diastolic dysfunction.  However her exam is very difficult to judge her volume status.  She has not improved despite high dose diuresis.  Respiratory viral panel returned negative.  Hypoxia might be worsened by acute anemia which we are going to treat with a transfusion today.  Also plan to get a CT chest today to evaluate lung parenchyma given persistent hypoxia.  I wonder if this is consolidated pneumonia or is this interstitial edema.  Differential includes cardiogenic pulmonary edema or noncardiogenic pulmonary edema such as DAH or vasculitis.  We will check for ANCA auto antibodies.  Acute on chronic normocytic anemia: Baseline hemoglobin around 8 g, likely due to baseline renal disease.  Worsening anemia could be due to frequent lab draws, worsening renal disease, microscopic hematuria, or worst case scenario ongoing DAH.  Plan for 1 units red blood cell transfusion today.  Check  reticulocyte count.  Acute on chronic kidney disease: Patient with a baseline CKD stage 3B, slightly worsened on admission.  Renal function has been stable with diuresis.  Apixaban for DVT prophylaxis  Not yet ready for disposition, awaiting improvement in respiratory failure first.   Tyson AliasVincent, Rosalie Buenaventura Thomas, MD 09/19/2017, 2:20 PM

## 2017-09-20 DIAGNOSIS — E669 Obesity, unspecified: Secondary | ICD-10-CM

## 2017-09-20 DIAGNOSIS — J189 Pneumonia, unspecified organism: Secondary | ICD-10-CM | POA: Diagnosis present

## 2017-09-20 DIAGNOSIS — J181 Lobar pneumonia, unspecified organism: Secondary | ICD-10-CM

## 2017-09-20 LAB — CBC
HEMATOCRIT: 23.9 % — AB (ref 36.0–46.0)
HEMOGLOBIN: 7.7 g/dL — AB (ref 12.0–15.0)
MCH: 27 pg (ref 26.0–34.0)
MCHC: 32.2 g/dL (ref 30.0–36.0)
MCV: 83.9 fL (ref 78.0–100.0)
Platelets: 425 10*3/uL — ABNORMAL HIGH (ref 150–400)
RBC: 2.85 MIL/uL — AB (ref 3.87–5.11)
RDW: 16.2 % — AB (ref 11.5–15.5)
WBC: 11.3 10*3/uL — AB (ref 4.0–10.5)

## 2017-09-20 LAB — MPO/PR-3 (ANCA) ANTIBODIES: Myeloperoxidase Abs: <9 U/mL (ref 0.0–9.0)

## 2017-09-20 LAB — GLUCOSE, CAPILLARY
GLUCOSE-CAPILLARY: 134 mg/dL — AB (ref 65–99)
Glucose-Capillary: 105 mg/dL — ABNORMAL HIGH (ref 65–99)
Glucose-Capillary: 153 mg/dL — ABNORMAL HIGH (ref 65–99)

## 2017-09-20 LAB — BASIC METABOLIC PANEL
ANION GAP: 11 (ref 5–15)
BUN: 37 mg/dL — ABNORMAL HIGH (ref 6–20)
CALCIUM: 8.4 mg/dL — AB (ref 8.9–10.3)
CO2: 26 mmol/L (ref 22–32)
Chloride: 104 mmol/L (ref 101–111)
Creatinine, Ser: 1.62 mg/dL — ABNORMAL HIGH (ref 0.44–1.00)
GFR calc Af Amer: 35 mL/min — ABNORMAL LOW (ref >60)
GFR calc non Af Amer: 30 mL/min — ABNORMAL LOW (ref >60)
GLUCOSE: 104 mg/dL — AB (ref 65–99)
POTASSIUM: 3.8 mmol/L (ref 3.5–5.1)
Sodium: 141 mmol/L (ref 135–145)

## 2017-09-20 LAB — ANCA TITERS
Atypical P-ANCA titer: <1:20 {titer}
C-ANCA: <1:20 {titer}

## 2017-09-20 MED ORDER — AZITHROMYCIN 500 MG IV SOLR
250.0000 mg | INTRAVENOUS | Status: DC
  Administered 2017-09-21: 250 mg via INTRAVENOUS
  Filled 2017-09-20: qty 250

## 2017-09-20 MED ORDER — FUROSEMIDE 80 MG PO TABS
80.0000 mg | ORAL_TABLET | Freq: Two times a day (BID) | ORAL | Status: DC
  Administered 2017-09-20 – 2017-09-22 (×5): 80 mg via ORAL
  Filled 2017-09-20 (×5): qty 1

## 2017-09-20 MED ORDER — FUROSEMIDE 80 MG PO TABS: 80.0000 mg | ORAL_TABLET | Freq: Two times a day (BID) | ORAL | Status: DC

## 2017-09-20 MED ORDER — SODIUM CHLORIDE 0.9 % IV SOLN
500.0000 mg | Freq: Once | INTRAVENOUS | Status: AC
  Administered 2017-09-20: 500 mg via INTRAVENOUS
  Filled 2017-09-20: qty 500

## 2017-09-20 MED ORDER — FUROSEMIDE 10 MG/ML IJ SOLN
60.0000 mg | Freq: Two times a day (BID) | INTRAMUSCULAR | Status: DC
  Administered 2017-09-20: 60 mg via INTRAVENOUS
  Filled 2017-09-20: qty 6

## 2017-09-20 MED ORDER — FUROSEMIDE 10 MG/ML IJ SOLN: 60.0000 mg | Freq: Two times a day (BID) | INTRAMUSCULAR | Status: DC

## 2017-09-20 MED ORDER — SODIUM CHLORIDE 0.9 % IV SOLN
2.0000 g | INTRAVENOUS | Status: DC
  Administered 2017-09-20 – 2017-09-22 (×3): 2 g via INTRAVENOUS
  Filled 2017-09-20 (×4): qty 20

## 2017-09-20 NOTE — Progress Notes (Signed)
Physical Therapy Treatment Patient Details Name: Jamie Mason MRN: [id number] DOB: [birth year] Today's Date: 09/20/2017    History of Present Illness Jamie Mason is 77 yo F with PMHx of HTN, HLD, CAD, T2DM, CKDIV, A-fib, CHF (LV EF 55-60% in 2018, Hx of G1DD), chronic venous insufficiency, and morbid obesity who presented for evaluation of generalized weakness and bilateral lower extremity edema.  This appears to most likely be secondary to patient's acute CHF and chronic venous insufficiency.  Pt currently admit with acute on chronic CHF, recurrent falls, LLE cellulitis and afib.     PT Comments    Pt's HR ranging from 50 at rest to max of 67 bpm with activity.  Sats with activity on 3 L [place] at 94% otherwise higher at rest.  Pt able to participate in 4 trials for gait ranging from 6 to 10 feet at a time.   Follow Up Recommendations  Supervision/Assistance - 24 hour;Home health PT     Equipment Recommendations  Wheelchair (measurements PT);Wheelchair cushion (measurements PT);Other (comment)    Recommendations for Other Services       Precautions / Restrictions Precautions Precautions: Fall    Mobility  Bed Mobility               General bed mobility comments: pt up in the chair and left in the chair at end of session  Transfers Overall transfer level: Needs assistance Equipment used: Rolling walker (2 wheeled) Transfers: Sit to/from Stand Sit to Stand: Mod assist;Min assist;+2 safety/equipment            Ambulation/Gait Ambulation/Gait assistance: Min assist;+2 safety/equipment Ambulation Distance (Feet): 6 Feet(then 10, 8 and 10 with rests in between.) Assistive device: Rolling walker (2 wheeled) Gait Pattern/deviations: Step-through pattern;Trunk flexed   Gait velocity interpretation: Below normal speed for age/gender General Gait Details: short effortful steps, steady for short distances, but the longer the distance the further from the RW and the less  safe.   Stairs            Wheelchair Mobility    Modified Rankin (Stroke Patients Only)       Balance Overall balance assessment: Needs assistance Sitting-balance support: No upper extremity supported Sitting balance-Leahy Scale: Fair       Standing balance-Leahy Scale: Poor Standing balance comment: reliant on the RW.  time spent with w/shifting and stepping in place                            Cognition Arousal/Alertness: Awake/alert Behavior During Therapy: WFL for tasks assessed/performed Overall Cognitive Status: Within Functional Limits for tasks assessed                                        Exercises      General Comments        Pertinent Vitals/Pain Pain Assessment: No/denies pain    Home Living                      Prior Function            PT Goals (current goals can now be found in the care plan section) Acute Rehab PT Goals Patient Stated Goal: to go home PT Goal Formulation: With patient Time For Goal Achievement: 09/30/17 Potential to Achieve Goals: Good Progress towards PT goals: Progressing toward goals  Frequency    Min 3X/week      PT Plan Current plan remains appropriate    Co-evaluation              AM-PAC PT "6 Clicks" Daily Activity  Outcome Measure  Difficulty turning over in bed (including adjusting bedclothes, sheets and blankets)?: Unable Difficulty moving from lying on back to sitting on the side of the bed? : Unable Difficulty sitting down on and standing up from a chair with arms (e.g., wheelchair, bedside commode, etc,.)?: Unable Help needed moving to and from a bed to chair (including a wheelchair)?: A Little Help needed walking in hospital room?: A Little Help needed climbing 3-5 steps with a railing? : A Lot 6 Click Score: 11    End of Session Equipment Utilized During Treatment: Oxygen Activity Tolerance: Patient tolerated treatment well Patient left: in  chair;with call bell/phone within reach Nurse Communication: Mobility status PT Visit Diagnosis: Unsteadiness on feet (R26.81);Repeated falls (R29.6);Muscle weakness (generalized) (M62.81)     Time: [id number] PT Time Calculation (min) (ACUTE ONLY): 38 min  Charges:  $Gait Training: 8-22 mins $Therapeutic Activity: 23-37 mins                    G Codes:       09/20/2017  [place] BingKen Nayshawn Mesta, PT [phone]  (pager)   Eliseo GumKenneth V Maleko Greulich 09/20/2017, 4:16 PM

## 2017-09-20 NOTE — Progress Notes (Addendum)
Patient OOB to chair x 1 assist.  Informed patient that she is to get OOB to chair for all meals.  Patient states understanding.  Patient ambulated in room with Mobility Tech earlier this am, and sat in the chair for breakfast.

## 2017-09-20 NOTE — Progress Notes (Signed)
   Subjective:  Patient feels about the same today, still week and short of breath. Unable to get out of bed yesterday. Persistent non-productive cough. She denies recent aspiration, no trouble with swallowing.   Objective:  Blood pressure (!) 132/46, pulse (!) 55, temperature 98.6 F (37 C), temperature source Oral, resp. rate 18, height 5\' 5"  (1.651 m), weight (!) 314 lb 8 oz (142.7 kg), SpO2 93 %. Gen: obese woman, chronically ill appearing, tired but no distress Lungs: distant sounds, no wheezing Ext:  Warm, well perfursed, trace non-pitting edema  A/P:  77 year old woman hospital day #5 with acute on chronic hypoxic respiratory failure.   1. Pneumonia: I personally reviewed the CT scan which shows underlying emphysema, and a few areas of focal infiltrates in the posterior aspects of the upper lobes and the superior aspect of the lower lobes.  I think this could be consistent with a bacterial community-acquired pneumonia, less likely viral given negative pcr panel. HAP seems unlikely, I bet this was brewing prior to admission and either it was not picked up on initial chest xray or obscured by obesity. Distribution consistent with aspiration, but no history dysphagia. Plan is for ceftriaxone and azithromycin, monitor response. Wean oxygen as able. Need more out of bed activity, PT and OT.  2. Acute on chronic normocytic anemia or renal disease:  Likely worsened by acute illness and phlebotomy. Tolerated one unit transfusion yesterday. Will limit blood draws and monitor.  DOAC for DVT prophylaxis.   Anticipate she will need SNF, but already telling us she only wants to go home because of bad past experience with SNF rehab. We will see how she does with therapy.   Tyson AliasVincent, Duncan Thomas, MD 09/20/2017, 1:50 PM

## 2017-09-20 NOTE — Progress Notes (Signed)
Patient ambulated in room with PT.  Now sitting up in chair with family at bedside.  Patient has ambulated in room twice today, and sat in the chair a total of three times for over an hour each time.

## 2017-09-21 ENCOUNTER — Encounter: Payer: Self-pay | Admitting: Pharmacist

## 2017-09-21 DIAGNOSIS — E785 Hyperlipidemia, unspecified: Secondary | ICD-10-CM

## 2017-09-21 LAB — BASIC METABOLIC PANEL
Anion gap: 11 (ref 5–15)
BUN: 30 mg/dL — ABNORMAL HIGH (ref 6–20)
CHLORIDE: 104 mmol/L (ref 101–111)
CO2: 26 mmol/L (ref 22–32)
Calcium: 8.6 mg/dL — ABNORMAL LOW (ref 8.9–10.3)
Creatinine, Ser: 1.58 mg/dL — ABNORMAL HIGH (ref 0.44–1.00)
GFR calc Af Amer: 36 mL/min — ABNORMAL LOW (ref >60)
GFR calc non Af Amer: 31 mL/min — ABNORMAL LOW (ref >60)
GLUCOSE: 103 mg/dL — AB (ref 65–99)
POTASSIUM: 3.5 mmol/L (ref 3.5–5.1)
Sodium: 141 mmol/L (ref 135–145)

## 2017-09-21 LAB — CBC
HEMATOCRIT: 24.8 % — AB (ref 36.0–46.0)
HEMOGLOBIN: 8 g/dL — AB (ref 12.0–15.0)
MCH: 27.4 pg (ref 26.0–34.0)
MCHC: 32.3 g/dL (ref 30.0–36.0)
MCV: 84.9 fL (ref 78.0–100.0)
PLATELETS: 432 10*3/uL — AB (ref 150–400)
RBC: 2.92 MIL/uL — AB (ref 3.87–5.11)
RDW: 16.3 % — ABNORMAL HIGH (ref 11.5–15.5)
WBC: 10.9 10*3/uL — ABNORMAL HIGH (ref 4.0–10.5)

## 2017-09-21 LAB — GLUCOSE, CAPILLARY
GLUCOSE-CAPILLARY: 114 mg/dL — AB (ref 65–99)
GLUCOSE-CAPILLARY: 115 mg/dL — AB (ref 65–99)
Glucose-Capillary: 101 mg/dL — ABNORMAL HIGH (ref 65–99)
Glucose-Capillary: 119 mg/dL — ABNORMAL HIGH (ref 65–99)

## 2017-09-21 MED ORDER — AZITHROMYCIN 250 MG PO TABS
250.0000 mg | ORAL_TABLET | Freq: Every day | ORAL | Status: DC
  Administered 2017-09-22: 250 mg via ORAL
  Filled 2017-09-21: qty 1

## 2017-09-21 NOTE — Progress Notes (Addendum)
Subjective: The patient was resting in her bed today point in the room.  She stated that other than a brief episode this morning with shortness of breath she is feeling much better.  As per nursing report and patient she was able to eat all of her meals sitting up and ambulate around her room without notable difficulty.  The patient feels that she is improving overall.  Objective:  Vital signs in last 24 hours: Vitals:   09/20/17 1600 09/20/17 2006 09/20/17 2054 09/21/17 0643  BP:   (!) 128/52 128/60  Pulse: 64  (!) 47 (!) 52  Resp: 13  14 16   Temp:   98.5 F (36.9 C) 98.7 F (37.1 C)  TempSrc:   Oral Oral  SpO2: 100% 99% 98%   Weight:    (!) 318 lb 3.2 oz (144.3 kg)  Height:       ROS negative except as per HPI  Physical exam: General: In no acute distress, afebrile, nondiaphoretic, respiratory rate is normal on 3 L via nasal cannula with O2 sats 95% Neuro: No acute focal deficits Cardiovascular: RRR no murmurs rubs or gallops auscultated Pulmonary: Patient continues to demonstrate bilateral crackles and lower lung fields GI: Abdomen is soft, nontender, normal bowel sounds Musculoskeletal: Bilateral lower extremities nontender, nonedematous  Assessment/Plan:  Principal Problem:   CAP (community acquired pneumonia) Active Problems:   Type 2 diabetes mellitus with stage 2 chronic kidney disease (HCC)   Anemia   Chronic kidney failure, stage 3 (moderate) (HCC)   Acute on chronic heart failure (HCC)  Assessment: This is a 77 yo with PMH of HTN, HLD, CAD, T2DM, CKD stage 2, A-Fib, CHF (LVEF 55-60% in 2018, hx of G1DD), chronic venous insufficiency, and morbid obesity who is presenting for evaluation of generalized weakness and bilateral lower extremity swelling.   Plan: Pneumonia: CXR on admission concerning for interstitial edema versus atypical pneumonia.  Patient initially improved with IV diuresis but plateaued with persistent oxygen requirements despite an adequate  negative fluid balance.  Following failed expected resolution a CT of the chest was ordered which revealed possible pneumonia with focal consolidation in the left and right upper lobes and right middle lobe.  There is also concern noted for emphysema.  Patient initiated on antibiotics for CAP coverage as it is thought that the pneumonia contributed to the acute on chronic CHF exacerbation but did not reveal itself until improvement in her CHF had occurred his CH significant cough or chest pain denies chest pain, overall denying symptoms consistent with pneumonia.  There is mild clinical improvement on evaluation today -Continue azithromycinto complete a 5-day course -Continue ceftriaxone to complete a 5-day course   Acute on Chronic CHF: Echo this admission noted LV EF of 60-65% and G2DD as well as a dilated atrium. She Improved with IV lasix 120mg  BID despite insignificant weight loss. Continued Lasix 80mg  PO BID. Strict I/Os and daily weights continued.  LLE cellulitis: Improved. Completed five day course of antibiotics.  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  He was noted to have a hemoglobin of 6.9 and in the setting of dyspnea and fatigue with concerned that this may be contributing to her overall poor respiratory status and hypoxia.  Will transfuse 1 unit with notable improvement 8.3.  This appears stable today at 8.0. -CBC in a.m. to monitor WBC and hemoglobin  Diet: Regular Code: Partial, DNI  fluids: Not applicable  DVT PPx: Continued home Eliquis 5 mg Dispo: Anticipated discharge in approximately 2-3 day(s) preventing continued improvement antibiotics.   Jamie Mason, [name], MD 09/21/2017, 6:52 AM Pager: Pager# [phone]  Addendum: Placed order for Home health needs, PT, RN, Aid, CSW and wheelchair.   Medications reviewed by clinical pharmacists Marzetta BoardJennifer Kim as below.  Medication [facility]  [facility]  Carvedilol last filled 11/09/2016, 90 day supply Furosemide 12/26/16, 90 day supply Hydralazine 01/04/17, 90 day supply Apixaban Sep. 2018, 90 day supply Metformin 05/02/17, 90 day supply Amiodarone 06/11/17, 90 day supply  Atorvastatin 06/27/17, 90 day supply Diltiazem 240 mg 08/09/17, 90 day supply

## 2017-09-21 NOTE — Progress Notes (Signed)
   I saw and examined the patient. I reviewed the resident's note and I agree with the resident's findings and plan as documented in the resident's note.  Chronically ill patient making some progress with treatment of pneumonia and acute on chronic HFpEF. She is stable today, was able to mobilize a good bit yesterday. PT recommending home with services which I think is a great idea. Will try weaning oxygen more, still on 2-3 LPM via [place] even at rest, I anticipate given her underlying emphysema, HFpEF, and obesity it would be best to arrange for home oxygen. Will need to get qualifying saturations documented and submit DME. If she continues to improve, may be able to discharge to home tomorrow [facility].  Tyson AliasVincent, Theoden Mauch Thomas, MD 09/21/2017, 3:52 PM

## 2017-09-21 NOTE — Progress Notes (Signed)
    Durable Medical Equipment  (From admission, onward)        Start     Ordered   09/21/17 1521  For home use only DME standard manual wheelchair with seat cushion  Once    Comments:  Patient suffers from weakness secondary to CHF, COPD, diabetes, venous insufficiency which impairs their ability to perform daily activities like bathing and dressing in the home.  A walker will not resolve  issue with performing activities of daily living. A wheelchair will allow patient to safely perform daily activities. Patient can safely propel the wheelchair in the home or has a caregiver who can provide assistance.  Accessories: elevating leg rests (ELRs), wheel locks, extensions and anti-tippers.   09/21/17 1520    09/21/2017  [place] BingKen Cornelious Diven, PT [phone]  (pager)

## 2017-09-21 NOTE — Progress Notes (Signed)
[facility]  Carvedilol last filled 11/09/2016, 90 day supply Furosemide 12/26/16, 90 day supply Hydralazine 01/04/17, 90 day supply Apixaban Sep. 2018, 90 day supply Metformin 05/02/17, 90 day supply Amiodarone 06/11/17, 90 day supply  Atorvastatin 06/27/17, 90 day supply Diltiazem 240 mg 08/09/17, 90 day supply

## 2017-09-21 NOTE — Progress Notes (Signed)
Patient OOB to chair for breakfast and lunch.  Re-weighed patient on standing scale prior to lunch, and weight remained the same as earlier this am (318.1lbs/144.3kg).  Oxygen [facility] to [facility], and oxygen saturation is 94%-97%.

## 2017-09-21 NOTE — Care Management Note (Addendum)
Case Management Note  Patient Details  Name: Jamie Mason MRN: [id number] Date of Birth: 03/20/1941  Subjective/Objective: Pt presented for weakness and BLE edema and has hx of CKD. Chronic Heart Failure- initiated on IV Lasix and IV antibiotic therapy for Pneumonia. [facility]. PTA from home with husband and daughter will stay until second daughter arrives from work.                   Action/Plan: Agency List provided and pt chose [facility]. Referral sent to [facility] with [facility] and SOC to begin within 24-48 hours post d/c. [facility] DME WC to be ordered and delivered to room prior to d/c. Pt will need orders for Services listed below with F2F and DME orders for Wheel Chair. CM to follow for 02. No further needs from CM at this time.   Expected Discharge Date:                  Expected Discharge Plan:  Home w Home Health Services  In-House Referral:  Clinical Social Work  Discharge planning Services  CM Consult  Post Acute Care Choice:  Home Health Choice offered to:  Patient  DME Arranged:  Wheelchair manual, Oxygen DME Agency:  [facility]  HH Arranged:  RN, Disease Management, PT, Nurse's Aide, Social Work [employer] Agency:  [facility]  Status of Service:  Completed, signed off  If discussed at [employer] Meetings, dates discussed:    Additional Comments: 1700 09-22-17 Tomi BambergerBrenda Graves-Bigelow, RN,BSN [phone] CM did speak with patient in regards to 02- Pt wants [facility] transport home- no family available to pick up. DME concentrator to be delivered to home. WC will need to be purchased from store. Staff RN to call [facility] after hours once 02 delivered home.    609 09-22-17 Tomi BambergerBrenda Graves-Bigelow Pt qualifies for home 02[facility] Will deliver to room prior to d/c. [facility] to follow in the community.  Gala LewandowskyGraves-Bigelow, Dejha King Kaye, RN 09/21/2017, 2:47 PM

## 2017-09-21 NOTE — Progress Notes (Signed)
PHARMACIST - PHYSICIAN COMMUNICATION  CONCERNING: Antibiotic IV to Oral Route Change Policy  RECOMMENDATION: This patient is receiving Azithromycin by the intravenous route.  Based on criteria approved by the Pharmacy and Therapeutics Committee, the antibiotic(s) is/are being converted to the equivalent oral dose form(s).   DESCRIPTION:  These criteria include:  Patient being treated for a respiratory tract infection, urinary tract infection, cellulitis or clostridium difficile associated diarrhea if on metronidazole  The patient is not neutropenic and does not exhibit a GI malabsorption state  The patient is eating (either orally or via tube) and/or has been taking other orally administered medications for a least 24 hours  The patient is improving clinically and has a Tmax < 100.5  If you have questions about this conversion, please contact the Pharmacy Department  []  ( [phone] )  [place] []  ( [phone]  [facility] [x]  ( [phone]  [place] []  ( [phone]  [facility] []  ( [phone] )  [facility]    Jacobe Study, PharmD Pharmacy Resident Pager: [phone]  

## 2017-09-21 NOTE — Progress Notes (Signed)
OT Cancellation Note  Patient Details Name: Jamie Mason MRN: [id number] DOB: 09/06/1940   Cancelled Treatment:    Reason Eval/Treat Not Completed: Fatigue/lethargy limiting ability to participate(pt reports she just returned to bed; wants to rest). Encouraged OOB with RN staff for meals; pt agreeable.  Jamie Mason M.S., OTR/L Pager: [phone]  09/21/2017, 11:22 AM

## 2017-09-22 DIAGNOSIS — Z9889 Other specified postprocedural states: Secondary | ICD-10-CM

## 2017-09-22 DIAGNOSIS — Z6841 Body Mass Index (BMI) 40.0 and over, adult: Secondary | ICD-10-CM

## 2017-09-22 DIAGNOSIS — J439 Emphysema, unspecified: Secondary | ICD-10-CM

## 2017-09-22 DIAGNOSIS — L89159 Pressure ulcer of sacral region, unspecified stage: Secondary | ICD-10-CM

## 2017-09-22 LAB — BASIC METABOLIC PANEL
Anion gap: 10 (ref 5–15)
BUN: 28 mg/dL — ABNORMAL HIGH (ref 6–20)
CALCIUM: 8.4 mg/dL — AB (ref 8.9–10.3)
CO2: 26 mmol/L (ref 22–32)
CREATININE: 1.56 mg/dL — AB (ref 0.44–1.00)
Chloride: 104 mmol/L (ref 101–111)
GFR, EST AFRICAN AMERICAN: 37 mL/min — AB (ref >60)
GFR, EST NON AFRICAN AMERICAN: 32 mL/min — AB (ref >60)
GLUCOSE: 92 mg/dL (ref 65–99)
Potassium: 3.5 mmol/L (ref 3.5–5.1)
Sodium: 140 mmol/L (ref 135–145)

## 2017-09-22 LAB — GLUCOSE, CAPILLARY
GLUCOSE-CAPILLARY: 118 mg/dL — AB (ref 65–99)
Glucose-Capillary: 111 mg/dL — ABNORMAL HIGH (ref 65–99)
Glucose-Capillary: 114 mg/dL — ABNORMAL HIGH (ref 65–99)

## 2017-09-22 LAB — CBC
HCT: 24.4 % — ABNORMAL LOW (ref 36.0–46.0)
Hemoglobin: 7.8 g/dL — ABNORMAL LOW (ref 12.0–15.0)
MCH: 27.4 pg (ref 26.0–34.0)
MCHC: 32 g/dL (ref 30.0–36.0)
MCV: 85.6 fL (ref 78.0–100.0)
PLATELETS: 427 10*3/uL — AB (ref 150–400)
RBC: 2.85 MIL/uL — AB (ref 3.87–5.11)
RDW: 16.4 % — ABNORMAL HIGH (ref 11.5–15.5)
WBC: 10.8 10*3/uL — ABNORMAL HIGH (ref 4.0–10.5)

## 2017-09-22 MED ORDER — FUROSEMIDE 80 MG PO TABS: 80.0000 mg | ORAL_TABLET | Freq: Two times a day (BID) | ORAL | 2 refills | Status: DC

## 2017-09-22 MED ORDER — CARVEDILOL 12.5 MG PO TABS: 12.5000 mg | ORAL_TABLET | Freq: Two times a day (BID) | ORAL | 2 refills | Status: DC

## 2017-09-22 MED ORDER — AZITHROMYCIN 250 MG PO TABS: ORAL_TABLET | ORAL | 0 refills | Status: DC

## 2017-09-22 MED ORDER — CARVEDILOL 12.5 MG PO TABS
12.5000 mg | ORAL_TABLET | Freq: Two times a day (BID) | ORAL | Status: DC
  Administered 2017-09-22: 12.5 mg via ORAL
  Filled 2017-09-22: qty 1

## 2017-09-22 MED ORDER — CEFDINIR 300 MG PO CAPS: 300.0000 mg | ORAL_CAPSULE | Freq: Two times a day (BID) | ORAL | 0 refills | Status: DC

## 2017-09-22 MED ORDER — CEFDINIR 300 MG PO CAPS
300.0000 mg | ORAL_CAPSULE | Freq: Two times a day (BID) | ORAL | Status: DC
  Administered 2017-09-22 (×2): 300 mg via ORAL
  Filled 2017-09-22 (×2): qty 1

## 2017-09-22 NOTE — Care Management Important Message (Signed)
Important Message  Patient Details  Name: Jamie Mason MRN: [id number] Date of Birth: 11/03/1940   Medicare Important Message Given:  Yes    Gala LewandowskyGraves-Bigelow, Jnai Snellgrove Kaye, RN 09/22/2017, 11:24 AM

## 2017-09-22 NOTE — Discharge Summary (Signed)
Name: Jamie Mason MRN: [id number] DOB: 04/25/1941 77 y.o. PCP: Burns SpainButcher, Elizabeth A, MD  Date of Admission: 09/14/2017  3:45 PM Date of Discharge: 4/13/201904/05/2018 Attending Physician: Dr. 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)   Anemia   Chronic kidney failure, stage 3 (moderate) (HCC)   Acute on chronic heart failure (HCC)   Sacral pressure ulcer   Discharge Medications: Allergies as of 09/23/2017      Reactions   Lisinopril Swelling   Tongue swelling    Ace Inhibitors Other (See Comments)   unknown   Codeine Itching, Rash   Penicillins Itching, Rash   Has patient had a PCN reaction causing immediate rash, facial/tongue/throat swelling, SOB or lightheadedness with hypotension: unknown Has patient had a PCN reaction causing severe rash involving mucus membranes or skin necrosis: unknown Has patient had a PCN reaction that required hospitalization: no Has patient had a PCN reaction occurring within the last 10 years: no If all of the above answers are "NO", then may proceed with Cephalosporin use. TOLERATES CEPHALOSPORINS   Tomato    Itchy rash      Medication List    TAKE these medications   acetaminophen 500 MG tablet Commonly known as:  TYLENOL Take 1,000 mg by mouth as needed for mild pain.   amiodarone 200 MG tablet Commonly known as:  PACERONE Take 1 tablet (200 mg total) by mouth daily.   apixaban 5 MG Tabs tablet Commonly known as:  ELIQUIS Take 1 tablet (5 mg total) by mouth 2 (two) times daily.   ASPIR-LOW 81 MG EC tablet Generic drug:  aspirin Take 81 mg by mouth daily. Swallow whole.   atorvastatin 40 MG tablet Commonly known as:  LIPITOR Take 1 tablet (40 mg total) by mouth daily.   azithromycin 250 MG tablet Commonly known as:  ZITHROMAX Please take one dose on 04/13 and one more on 04/14 to complete  your treatment   Capsaicin-Menthol-Methyl Sal 0.025-1-12 % Crea Commonly known as:  capsaicin-methyl sal-menthol Apply 1 mg topically 4 (four) times daily.   carvedilol 12.5 MG tablet Commonly known as:  COREG Take 1 tablet (12.5 mg total) by mouth 2 (two) times daily with a meal. What changed:    medication strength  how much to take   cefdinir 300 MG capsule Commonly known as:  OMNICEF Take 1 capsule (300 mg total) by mouth every 12 (twelve) hours.   diltiazem 240 MG 24 hr capsule Commonly known as:  CARDIZEM CD Take 1 capsule (240 mg total) by mouth daily.   furosemide 80 MG tablet Commonly known as:  LASIX Take 1 tablet (80 mg total) by mouth 2 (two) times daily. What changed:    medication strength  how much to take  additional instructions   hydrALAZINE 50 MG tablet Commonly known as:  APRESOLINE Take 1 tablet (50 mg total) by mouth 3 (three) times daily.   hydrocerin Crea Apply to bilateral LEs after cleansing on Mondays and Thursdays (prior to [employer] application).   metFORMIN 500 MG tablet Commonly known as:  GLUCOPHAGE Take 1 tablet (500 mg total) by mouth 2 (two) times daily with a meal.   polyethylene glycol powder powder Commonly known as:  GLYCOLAX/MIRALAX MIX 1 CAPFUL (17GM) IN LIQUID AND DRINK EVERY DAY   traMADol 50 MG tablet Commonly known as:  ULTRAM Take 1 tablet (50 mg  total) by mouth every 6 (six) hours as needed.   VENTOLIN HFA 108 (90 Base) MCG/ACT inhaler Generic drug:  albuterol INHALE 2 PUFFS INTO THE LUNGS EVERY 6 HOURS AS NEEDED FOR WHEEZING OR SHORTNESS OF BREATH.       Disposition and follow-up:   Jamie Mason was discharged from [facility] in Stable condition.  At the hospital follow up visit please address:  1.  Pneumonia: please assess oxygen requirements       Please assess need for continued home health-Has aid, RN, PT, CSW      Please assess her volume status, I.e. Need for lasix  adjustments      Please address her anemia, w/ CBC  2.  Labs / imaging needed at time of follow-up: CBC  3.  Pending labs/ test needing follow-up: n/a  Follow-up Appointments: Follow-up Information    [facility] Follow up.   Specialty:  Home Health Services Why:  Registered Nurse, Physical Therapy, Aide, Social Worker Contact information: [address] [place] [address] [place] [address] [phone]        [facility] - Dme Follow up.   Why:  Tree surgeon information: [address] [place] [address] [phone]        [facility] Follow up on 09/27/2017.   Why:  You have an appointment scheduled at 3:45. Please dont hesitate to call the clinic if this visit doesnt work for your schedule.  Contact information: [address] 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 Repeat CXR was concerning for pneumonia with CT confirming probable pneumonia with focal opacities concerning for multifocal pneumonia. She was started on ceftriaxone and azithromycin with notable improvement by day three of treatment. However, she was determined to require oxygen for ambulation given her severe underlying emphysema  and CHF. She was discharged home as she was not amenable to SNF placement with Home health, PT/OT, and social work.   Acute on chronic normocytic anemia: There was no clear indication of acute blood loss other than hematuria reported by staff with a mildly positive Hgb on urine dipstick and RBC's too numerous to count. This appeared to be chronic based on previous UA's. Given her significant dyspnea and a small trend downward in her Hgb from her near 8Hgb baseline she was administered a unit of PRBC's due to hypoxia and a 6.9Hgb. This only minimally improved her dyspnea. We recommended a repeat CBC on her clinic follow-up.   Discharge Vitals:   BP (!) 133/52 (BP Location: Left Arm)   Pulse (!) 52   Temp 99.3 F (37.4 C) (Oral)   Resp 17   Ht 5\' 5"  (1.651 m)   Wt (!) 310 lb  6.4 oz (140.8 kg)   SpO2 (!) 84%   BMI 51.65 kg/m   Pertinent Labs, Studies, and Procedures:  BMP Latest Ref Rng & Units 09/22/2017 09/21/2017 09/20/2017  Glucose 65 - 99 mg/dL 92 161(W) 960(A)  BUN 6 - 20 mg/dL 54(U) 98(J) 19(J)  Creatinine 0.44 - 1.00 mg/dL 4.78(G) 9.56(O) 1.30(Q)  BUN/Creat Ratio 12 - 28 - - -  Sodium 135 - 145 mmol/L 140 141 141  Potassium 3.5 - 5.1 mmol/L 3.5 3.5 3.8  Chloride 101 - 111 mmol/L 104 104 104  CO2 22 - 32 mmol/L 26 26 26   Calcium 8.9 - 10.3 mg/dL 6.5(H) 8.4(O) 9.6(E)   CBC Latest Ref Rng & Units 09/22/2017 09/21/2017 09/20/2017  WBC 4.0 - 10.5 K/uL 10.8(H) 10.9(H) 11.3(H)  Hemoglobin 12.0 - 15.0 g/dL 7.8(L) 8.0(L) 7.7(L)  Hematocrit 36.0 - 46.0 % 24.4(L) 24.8(L) 23.9(L)  Platelets 150 - 400 K/uL 427(H) 432(H) 425(H)   CT chest wo contrast: IMPRESSION: 1. Cardiomegaly with trace pleural effusion. 2. Moderate emphysema. Multifocal bilateral left greater than right ground-glass densities and areas of consolidation, suspect for multifocal pneumonia. 3. Mild mediastinal adenopathy, may be reactive.  Autoimmue labs: C-ANCA <1:20, P-ANCA <1:20 Myeloperoxidase <9.0, ANCA proteinase  3 <3.5  Discharge Instructions: Discharge Instructions    AMB Referral to [facility] Care Management   Complete by:  As directed    Please assign to [facility] RNCM for transition of care and CHF. Will have home health as well. Currently at [facility]. Written consent obtained. Please call with questions. Thanks. Raiford Noble, MSN-Ed, RN,BSN-[facility] Care [facility] Liaison-[phone]   Reason for consult:  Please assign to [facility] RNCM   Diagnoses of:  Heart Failure   Expected date of contact:  1-3 days (reserved for hospital discharges)   Call MD for:  extreme fatigue   Complete by:  As directed    Call MD for:  persistant dizziness or light-headedness   Complete by:  As directed    Call MD for:  persistant nausea and vomiting   Complete by:  As directed    Diet - low sodium heart healthy   Complete by:  As directed    Increase activity slowly   Complete by:  As directed       Signed: Lanelle Bal, MD 09/25/2017, 11:23 AM   Pager: Pager# [phone]

## 2017-09-22 NOTE — Progress Notes (Signed)
Subjective:  The patient was sitting up beside the bed table upon entering the room.  She states that she had been there for approximately 2 hours prior to her evaluation was not experiencing significant shortness of breath.  Although she continues to endorse mild weakness with ambulation she is able to invade to the restroom, Subjective: Does not feel short of breath as he did on admission, is anxious to be discharged home.  She continues to understand our assistance with possible skilled nursing facility placement but but believes that she will be well cared for at home as her daughters will be there 24/7.  Objective:  Vital signs in last 24 hours: Vitals:   09/21/17 1502 09/21/17 2112 09/21/17 2114 09/22/17 0610  BP: (!) 110/51 (!) 105/54  (!) 139/43  Pulse: (!) 49 (!) 49  (!) 56  Resp:  20  18  Temp: 98.3 F (36.8 C) 98.5 F (36.9 C)  99.3 F (37.4 C)  TempSrc: Oral Oral  Oral  SpO2: 97% 96% 97% 96%  Weight:    (!) 310 lb 6.4 oz (140.8 kg)  Height:       ROS negative except as per HPI  Physical exam:  General:  afebrile, not diaphoretic, conversant, no acute distress Neuro:Alert and oriented x4, no acute focal deficits observed Cardiovascular: There is a notable grade 3 systolic murmur, RRR, no rubs or gallops appreciated Pulmonary: Bilateral lungs clear to auscultation with a notable improvement over the prior day GI: Abdomen soft, nontender, normal bowel sounds Musculoskeletal: Bilateral lower extreme is are nonedematous, nontender to palpation.  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  Plan: Pneumonia: The patient appears to be improving significantly with regard to her pneumonia.  She has been able to decrease her reliance on oxygen and continues to increase the distance she can ambulate without oxygen desaturation daily.  She continues to decide that she would prefer to be discharged home with home health PT as opposed to being placed in a skilled nursing facility.  The patient's leukocytosis appears to improve daily started 10.8 today.  He is no longer afebrile, requiring increased oxygen at rest and with ambulation, with marked improvement in her dyspnea and respiratory rate. -Continue his azithromycin and ceftriaxone -Ambulate with pulse ox consider home O2 if indicated -Patient appears to be improving significantly and may be cleared for discharge later today  Acute on chronic CHF: Echo this admission noted LV EF of 60-65% and G2DD as well as a dilated atrium. She Improved with IV lasix 120mg  BID despite insignificant weight loss. Continued Lasix 80mg  PO BID. Strict I/Os and daily weights continued.  LLE cellulitis: Improved. Completed five day course of antibiotics.  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 was 7.8 which appears to be stable with a baseline of approximately 8.  I would recommend that she have a repeat CBC within 1 week following discharge with a PCP. -Outpatient follow-up of her chronic anemia  Diet: Regular Code: Partial, DNI  fluids: Not applicable DVT PPx: Continued home Eliquis 5 mg Dispo: Anticipated discharge in approximately 0-1 day(s).   Lanelle Bal, MD 09/22/2017, 6:36 AM Pager: Pager# [phone]

## 2017-09-22 NOTE — Progress Notes (Addendum)
Pt ambulated to 40 feet in her room  SATURATION QUALIFICATIONS: (This note is used to comply with regulatory documentation for home oxygen)  Patient Saturations on Room Air at Rest = 92 %  Patient Saturations on Room Air while Ambulating =88 %  Patient Saturations on 2 Liters of oxygen while Ambulating = 95  %  Please briefly explain why patient needs home oxygen:

## 2017-09-22 NOTE — Progress Notes (Signed)
Called to Follow  up on oxygen equipment at home. Pt daughter Jamie Mason has indicated the oxygen has not arrived yet. Will continue to monitor.

## 2017-09-22 NOTE — Progress Notes (Signed)
Physical Therapy Treatment Patient Details Name: Jamie Mason MRN: [id number] DOB: 04-Aug-1940 Today's Date: 09/22/2017    History of Present Illness Jamie Mason is 77 yo F with PMHx of HTN, HLD, CAD, T2DM, CKDIV, A-fib, CHF (LV EF 55-60% in 2018, Hx of G1DD), chronic venous insufficiency, and morbid obesity who presented for evaluation of generalized weakness and bilateral lower extremity edema.  This appears to most likely be secondary to patient's acute CHF and chronic venous insufficiency.  Pt currently admit with acute on chronic CHF, recurrent falls, LLE cellulitis and afib.     PT Comments    Pt improving steadily.  Checked sats on RA which fell to 85% and rose to 90% on 2L in <1 min.  Worked on transitions to/from EOB and transfer with RW to/from [facility].  Pt able to stand an extended time to pericare.   Follow Up Recommendations  Supervision/Assistance - 24 hour;Home health PT     Equipment Recommendations  Wheelchair (measurements PT);Wheelchair cushion (measurements PT);Other (comment)    Recommendations for Other Services       Precautions / Restrictions Precautions Precautions: Fall    Mobility  Bed Mobility   Bed Mobility: Supine to Sit;Sit to Supine     Supine to sit: Min assist Sit to supine: Mod assist   General bed mobility comments: pt needed min truncal assist to get up to EOB and assist for legs into bed.  Transfers Overall transfer level: Needs assistance Equipment used: Rolling walker (2 wheeled) Transfers: Sit to/from [employer] Sit to Stand: Min assist Stand pivot transfers: Min guard       General transfer comment: cues for hand placement, standing assist and guard for safety due to space was crowded for pt and RW.  Ambulation/Gait                 Stairs             Wheelchair Mobility    Modified Rankin (Stroke Patients Only)       Balance     Sitting balance-Leahy Scale: Fair     Standing  balance support: Bilateral upper extremity supported;During functional activity Standing balance-Leahy Scale: Poor Standing balance comment: reliant on the RW                            Cognition Arousal/Alertness: Awake/alert Behavior During Therapy: WFL for tasks assessed/performed Overall Cognitive Status: Within Functional Limits for tasks assessed                                        Exercises      General Comments General comments (skin integrity, edema, etc.): While setting up for toilet transfer, pt was left off O2.  SpO2 dropped within minutes to 84% at 80-90's bpm.  On 2L [place], pt's sats rose to 90% in less than a min      Pertinent Vitals/Pain Pain Assessment: No/denies pain Faces Pain Scale: No hurt    Home Living                      Prior Function            PT Goals (current goals can now be found in the care plan section) Acute Rehab PT Goals Patient Stated Goal: to go home PT Goal Formulation: With patient  Time For Goal Achievement: 09/30/17 Potential to Achieve Goals: Good Progress towards PT goals: Progressing toward goals    Frequency    Min 3X/week      PT Plan Current plan remains appropriate    Co-evaluation              AM-PAC PT "6 Clicks" Daily Activity  Outcome Measure  Difficulty turning over in bed (including adjusting bedclothes, sheets and blankets)?: Unable Difficulty moving from lying on back to sitting on the side of the bed? : Unable Difficulty sitting down on and standing up from a chair with arms (e.g., wheelchair, bedside commode, etc,.)?: Unable Help needed moving to and from a bed to chair (including a wheelchair)?: A Little Help needed walking in hospital room?: A Little Help needed climbing 3-5 steps with a railing? : A Lot 6 Click Score: 11    End of Session Equipment Utilized During Treatment: Oxygen Activity Tolerance: Patient tolerated treatment well Patient left: in  bed;with call bell/phone within reach Nurse Communication: Mobility status PT Visit Diagnosis: Unsteadiness on feet (R26.81);Repeated falls (R29.6)     Time: [id number] PT Time Calculation (min) (ACUTE ONLY): 28 min  Charges:  $Therapeutic Activity: 8-22 mins $Self Care/Home Management: 8-22                    G Codes:       09/22/2017  [place] BingKen Nivia Mason, PT [phone]  (pager)   Eliseo GumKenneth V Bufford Mason 09/22/2017, 4:57 PM

## 2017-09-22 NOTE — Consult Note (Signed)
   [facility]   09/22/2017  [name] 11/14/1940 [id number]   [facility].   Chart reviewed. [facility] for follow up.   Raiford NobleAtika Hall, MSN-Ed, RN,BSN [facility] Care Management Hospital Liaison [phone]

## 2017-09-23 LAB — BPAM RBC
Blood Product Expiration Date: 201905012359
Blood Product Expiration Date: 201905022359
ISSUE DATE / TIME: 201904091504
Unit Type and Rh: 5100
Unit Type and Rh: 5100

## 2017-09-23 LAB — TYPE AND SCREEN
ABO/RH(D): O POS
ANTIBODY SCREEN: POSITIVE
DAT, IgG: NEGATIVE
PT AG TYPE: NEGATIVE
Unit division: 0
Unit division: 0

## 2017-09-25 ENCOUNTER — Other Ambulatory Visit: Payer: Self-pay | Admitting: *Deleted

## 2017-09-25 DIAGNOSIS — N183 Chronic kidney disease, stage 3 (moderate): Secondary | ICD-10-CM | POA: Diagnosis not present

## 2017-09-25 DIAGNOSIS — L97821 Non-pressure chronic ulcer of other part of left lower leg limited to breakdown of skin: Secondary | ICD-10-CM | POA: Diagnosis not present

## 2017-09-25 DIAGNOSIS — J9620 Acute and chronic respiratory failure, unspecified whether with hypoxia or hypercapnia: Secondary | ICD-10-CM | POA: Diagnosis not present

## 2017-09-25 DIAGNOSIS — L89322 Pressure ulcer of left buttock, stage 2: Secondary | ICD-10-CM | POA: Diagnosis not present

## 2017-09-25 DIAGNOSIS — I5031 Acute diastolic (congestive) heart failure: Secondary | ICD-10-CM | POA: Diagnosis not present

## 2017-09-25 DIAGNOSIS — I5032 Chronic diastolic (congestive) heart failure: Secondary | ICD-10-CM | POA: Diagnosis not present

## 2017-09-25 DIAGNOSIS — J449 Chronic obstructive pulmonary disease, unspecified: Secondary | ICD-10-CM | POA: Diagnosis not present

## 2017-09-25 DIAGNOSIS — I4891 Unspecified atrial fibrillation: Secondary | ICD-10-CM | POA: Diagnosis not present

## 2017-09-25 DIAGNOSIS — I2510 Atherosclerotic heart disease of native coronary artery without angina pectoris: Secondary | ICD-10-CM | POA: Diagnosis not present

## 2017-09-25 DIAGNOSIS — I130 Hypertensive heart and chronic kidney disease with heart failure and stage 1 through stage 4 chronic kidney disease, or unspecified chronic kidney disease: Secondary | ICD-10-CM | POA: Diagnosis not present

## 2017-09-25 DIAGNOSIS — I872 Venous insufficiency (chronic) (peripheral): Secondary | ICD-10-CM | POA: Diagnosis not present

## 2017-09-25 DIAGNOSIS — R2689 Other abnormalities of gait and mobility: Secondary | ICD-10-CM | POA: Diagnosis not present

## 2017-09-25 DIAGNOSIS — L8990 Pressure ulcer of unspecified site, unspecified stage: Secondary | ICD-10-CM | POA: Diagnosis not present

## 2017-09-25 DIAGNOSIS — E1122 Type 2 diabetes mellitus with diabetic chronic kidney disease: Secondary | ICD-10-CM | POA: Diagnosis not present

## 2017-09-25 NOTE — Patient Outreach (Signed)
[facility](THN) Care Management  09/25/2017  Jamie NighGloria C Mason 11/08/1940 [id number]   New referral received from hospital liaison as member was recently admitted for heart failure, discharged on 4/13.  She is familiar to [facility] as she has been active with this care manager in the past and was recently active with health coach.  She also has history of hypertension, chronic venous insufficiency, atrial flutter, coronary artery disease, diabetes, hyperlipidemia, and obesity.    Call placed to member, she state she is "doing pretty good."  This care manager inquired about hospital admission, which was over 9 days.  She then state "I guess I wasn't doing as good as I thought," but report she is feeling better.  She has not seen her primary MD since November 2018, hasn't seen her cardiologist since June 2018.  She state she had to cancel her last appointment with her primary MD due to having pain in her leg and not being able to walk.  She state she is better now, but is still having trouble walking due to shortness of breath.  She has appointment scheduled for Wednesday, which she state she was unaware of.  This care manager inquired about transportation, she state she uses [employer] and will call them to secure.  She state she has trouble getting down her front steps, and will need driver to pull up in the driveway so she can come out of her back door.  She is aware to contact this care manger if she is unable to secure transportation.  Medication list reviewed.  She report compliance, however noted per pharmacist in MD office, she has multiple medications that has not been refilled in several months.  She report she has had extra medications that were sent by the pharmacy and she has not needed any refills.  She is adamant that she is compliant.  She does have history of non-compliance with medications and low sodium diet.  She denies any urgent concerns, home visit scheduled for next week.  THN  CM Care Plan Problem One     Most Recent Value  Care Plan Problem One  Risk for readmission related to heart failure management as evidenced by recent hospitalization  Role Documenting the Problem One  Care Management Coordinator  Care Plan for Problem One  Active  [facility] Long Term Goal   Patient will not have any admission for Congestive Heart Failure within the next 31 days  THN Long Term Goal Start Date  09/25/17  Interventions for Problem One Long Term Goal  Discussed with member the importance of following discharge instructions, including follow up appointments, medications, diet, and home health involvement, to decrease the risk of readmission  THN CM Short Term Goal #1   Patient will weigh daily and record within the next 30 days  [facility]N CM Short Term Goal #1 Start Date  09/25/17  Interventions for Short Term Goal #1  Member educated on importance of monitoring daily weights in effort to monitor trends and notify MD according to heart failure zones/action plan  THN CM Short Term Goal #2   Member will report taking medications daily as prescribed over the next 4 weeks  THN CM Short Term Goal #2 Start Date  09/25/17  Interventions for Short Term Goal #2  Medication list reviewed, advised of importance of adherence as she has history of non-compliance  THN CM Short Term Goal #3  Member will keep and attend appointment with primary MD this week  [facility]  CM Short Term Goal #3 Start Date  09/25/17  Interventions for Short Tern Goal #3  Confirmed member has scheduled appointment.  Discussed transportation, advised to call to secure and to notify this care manager if she is unable to secure transportation     [name], Charity fundraiser, MSN [facility] Care Management  [facility] Psychiatric Manager [phone]

## 2017-09-26 ENCOUNTER — Telehealth: Payer: Self-pay

## 2017-09-26 NOTE — Telephone Encounter (Signed)
Returned call to [employer]icolette. Verbal [facility] weeks plus 2 prn visits for wound care (stage 2 on left buttock) as well as disease process education and medication education. Will route to PCP to see if we are in agreement. Kinnie FeilL. Lunah Losasso, RN, BSN

## 2017-09-26 NOTE — Telephone Encounter (Signed)
Nicolette with [facility] requesting VO. Please call pt back.

## 2017-09-26 NOTE — Telephone Encounter (Signed)
Agree. Thanks

## 2017-09-27 ENCOUNTER — Telehealth: Payer: Self-pay | Admitting: Internal Medicine

## 2017-09-27 ENCOUNTER — Ambulatory Visit: Payer: Medicare HMO

## 2017-09-27 DIAGNOSIS — J449 Chronic obstructive pulmonary disease, unspecified: Secondary | ICD-10-CM | POA: Diagnosis not present

## 2017-09-27 DIAGNOSIS — L97821 Non-pressure chronic ulcer of other part of left lower leg limited to breakdown of skin: Secondary | ICD-10-CM | POA: Diagnosis not present

## 2017-09-27 DIAGNOSIS — I872 Venous insufficiency (chronic) (peripheral): Secondary | ICD-10-CM | POA: Diagnosis not present

## 2017-09-27 DIAGNOSIS — I5032 Chronic diastolic (congestive) heart failure: Secondary | ICD-10-CM | POA: Diagnosis not present

## 2017-09-27 DIAGNOSIS — N183 Chronic kidney disease, stage 3 (moderate): Secondary | ICD-10-CM | POA: Diagnosis not present

## 2017-09-27 DIAGNOSIS — E1122 Type 2 diabetes mellitus with diabetic chronic kidney disease: Secondary | ICD-10-CM | POA: Diagnosis not present

## 2017-09-27 DIAGNOSIS — I4891 Unspecified atrial fibrillation: Secondary | ICD-10-CM | POA: Diagnosis not present

## 2017-09-27 DIAGNOSIS — I130 Hypertensive heart and chronic kidney disease with heart failure and stage 1 through stage 4 chronic kidney disease, or unspecified chronic kidney disease: Secondary | ICD-10-CM | POA: Diagnosis not present

## 2017-09-27 DIAGNOSIS — L89322 Pressure ulcer of left buttock, stage 2: Secondary | ICD-10-CM | POA: Diagnosis not present

## 2017-09-27 NOTE — Telephone Encounter (Signed)
[facility] calls and ask for VO for HH wound care for pt: Venous stasis ulcer to LLE, woundcare nurse for [facility] suggests saline cleansing or wound cleanser and UNA boot 2x weekly for 6 weeks or changes occur. VO given, do you agree? Stage 2 pressure ulcer to L buttocks, wound care nurse suggest cleansing with ns or wound cleanser 3days per week. Do you agree? Pt will be reminded of app[facility]

## 2017-09-27 NOTE — Telephone Encounter (Signed)
VO OK 

## 2017-09-27 NOTE — Telephone Encounter (Signed)
[phone], is needing a verbal orders regarding wounds care orders

## 2017-09-28 ENCOUNTER — Telehealth: Payer: Self-pay | Admitting: Internal Medicine

## 2017-09-28 DIAGNOSIS — L97821 Non-pressure chronic ulcer of other part of left lower leg limited to breakdown of skin: Secondary | ICD-10-CM | POA: Diagnosis not present

## 2017-09-28 DIAGNOSIS — I872 Venous insufficiency (chronic) (peripheral): Secondary | ICD-10-CM | POA: Diagnosis not present

## 2017-09-28 DIAGNOSIS — L89322 Pressure ulcer of left buttock, stage 2: Secondary | ICD-10-CM | POA: Diagnosis not present

## 2017-09-28 DIAGNOSIS — I130 Hypertensive heart and chronic kidney disease with heart failure and stage 1 through stage 4 chronic kidney disease, or unspecified chronic kidney disease: Secondary | ICD-10-CM | POA: Diagnosis not present

## 2017-09-28 DIAGNOSIS — I4891 Unspecified atrial fibrillation: Secondary | ICD-10-CM | POA: Diagnosis not present

## 2017-09-28 DIAGNOSIS — N183 Chronic kidney disease, stage 3 (moderate): Secondary | ICD-10-CM | POA: Diagnosis not present

## 2017-09-28 DIAGNOSIS — E1122 Type 2 diabetes mellitus with diabetic chronic kidney disease: Secondary | ICD-10-CM | POA: Diagnosis not present

## 2017-09-28 DIAGNOSIS — I5032 Chronic diastolic (congestive) heart failure: Secondary | ICD-10-CM | POA: Diagnosis not present

## 2017-09-28 DIAGNOSIS — J449 Chronic obstructive pulmonary disease, unspecified: Secondary | ICD-10-CM | POA: Diagnosis not present

## 2017-09-28 NOTE — Telephone Encounter (Signed)
VO OK 

## 2017-09-28 NOTE — Telephone Encounter (Signed)
Verbal Berkley Harveyauth given to [facility] for 4 weeks for gait and transfer. Will send to PCP to see if we are in agreement. Kinnie FeilL. Ja Pistole, RN, BSN

## 2017-09-28 NOTE — Telephone Encounter (Signed)
Jamie Mason SackKendra [facility] [phone];  PT 2 a weeks form 4 week to work on walking

## 2017-10-02 ENCOUNTER — Other Ambulatory Visit: Payer: Self-pay | Admitting: Internal Medicine

## 2017-10-03 ENCOUNTER — Other Ambulatory Visit: Payer: Self-pay | Admitting: *Deleted

## 2017-10-03 DIAGNOSIS — I5032 Chronic diastolic (congestive) heart failure: Secondary | ICD-10-CM | POA: Diagnosis not present

## 2017-10-03 DIAGNOSIS — I872 Venous insufficiency (chronic) (peripheral): Secondary | ICD-10-CM | POA: Diagnosis not present

## 2017-10-03 DIAGNOSIS — J449 Chronic obstructive pulmonary disease, unspecified: Secondary | ICD-10-CM | POA: Diagnosis not present

## 2017-10-03 DIAGNOSIS — N183 Chronic kidney disease, stage 3 (moderate): Secondary | ICD-10-CM | POA: Diagnosis not present

## 2017-10-03 DIAGNOSIS — I130 Hypertensive heart and chronic kidney disease with heart failure and stage 1 through stage 4 chronic kidney disease, or unspecified chronic kidney disease: Secondary | ICD-10-CM | POA: Diagnosis not present

## 2017-10-03 DIAGNOSIS — I4891 Unspecified atrial fibrillation: Secondary | ICD-10-CM | POA: Diagnosis not present

## 2017-10-03 DIAGNOSIS — L89322 Pressure ulcer of left buttock, stage 2: Secondary | ICD-10-CM | POA: Diagnosis not present

## 2017-10-03 DIAGNOSIS — L97821 Non-pressure chronic ulcer of other part of left lower leg limited to breakdown of skin: Secondary | ICD-10-CM | POA: Diagnosis not present

## 2017-10-03 DIAGNOSIS — E1122 Type 2 diabetes mellitus with diabetic chronic kidney disease: Secondary | ICD-10-CM | POA: Diagnosis not present

## 2017-10-03 NOTE — Patient Outreach (Signed)
[facility]) Care Management   10/03/2017  Jamie Mason September 27, 1940 [id number]  Jamie Mason is an 77 y.o. female  Subjective:   Member alert and oriented x3, denies any pain or discomfort at this time.  Denies shortness of breath, able to take oxygen off during conversation without shortness of breath, saturations remain 97%, no activity.    Objective:   Review of Systems  Constitutional: Negative.   HENT: Negative.   Eyes: Negative.   Respiratory: Negative.   Cardiovascular: Positive for leg swelling.  Gastrointestinal: Negative.   Genitourinary: Negative.   Musculoskeletal: Negative.   Skin: Negative.   Neurological: Negative.   Endo/Heme/Allergies: Negative.   Psychiatric/Behavioral: Negative.     Physical Exam  Constitutional: She is oriented to person, place, and time. She appears well-developed and well-nourished.  Neck: Normal range of motion.  Cardiovascular: Normal rate, regular rhythm and normal heart sounds.  Respiratory: Effort normal and breath sounds normal.  GI: Soft. Bowel sounds are normal.  Musculoskeletal: Normal range of motion.  Neurological: She is alert and oriented to person, place, and time.  Skin: Skin is warm and dry.   BP 136/78 (BP Location: Right Arm, Patient Position: Sitting, Cuff Size: Normal)   Pulse 60   Resp 18   Ht 1.651 m ('5\' 5"' )   Wt (!) 310 lb (140.6 kg) Comment: Discharge weight  SpO2 97%   BMI 51.59 kg/m   Encounter Medications:   Outpatient Encounter Medications as of 10/03/2017  Medication Sig Note  . acetaminophen (TYLENOL) 500 MG tablet Take 1,000 mg by mouth as needed for mild pain.   Marland Kitchen amiodarone (PACERONE) 200 MG tablet Take 1 tablet (200 mg total) by mouth daily. 09/15/2017: LF on 12-31.18 90 day supply per [facility]  . apixaban (ELIQUIS) 5 MG TABS tablet Take 1 tablet (5 mg total) by mouth 2 (two) times daily. 09/15/2017: LF on 02-23-17  per [employer]  90 day supply  . aspirin (ASPIR-LOW) 81 MG EC  tablet Take 81 mg by mouth daily. Swallow whole.   Marland Kitchen atorvastatin (LIPITOR) 40 MG tablet TAKE 1 TABLET EVERY DAY   . carvedilol (COREG) 12.5 MG tablet Take 1 tablet (12.5 mg total) by mouth 2 (two) times daily with a meal.   . furosemide (LASIX) 80 MG tablet Take 1 tablet (80 mg total) by mouth 2 (two) times daily.   . hydrALAZINE (APRESOLINE) 50 MG tablet Take 1 tablet (50 mg total) by mouth 3 (three) times daily.   . hydrocerin (EUCERIN) CREA Apply to bilateral LEs after cleansing on Mondays and Thursdays (prior to [employer] application).   . metFORMIN (GLUCOPHAGE) 500 MG tablet Take 1 tablet (500 mg total) by mouth 2 (two) times daily with a meal. 09/15/2017: LF on 05-03-17 90 day supply  . traMADol (ULTRAM) 50 MG tablet Take 1 tablet (50 mg total) by mouth every 6 (six) hours as needed. 09/15/2017: Filled [facility]  on 08-03-17    . VENTOLIN HFA 108 (90 Base) MCG/ACT inhaler INHALE 2 PUFFS INTO THE LUNGS EVERY 6 HOURS AS NEEDED FOR WHEEZING OR SHORTNESS OF BREATH.   Marland Kitchen azithromycin (ZITHROMAX) 250 MG tablet Please take one dose on 04/13 and one more on 04/14 to complete your treatment (Patient not taking: Reported on 10/03/2017)   . Capsaicin-Menthol-Methyl Sal (CAPSAICIN-METHYL SAL-MENTHOL) 0.025-1-12 % CREA Apply 1 mg topically 4 (four) times daily. (Patient not taking: Reported on 04/06/2017)   . cefdinir (OMNICEF) 300 MG capsule Take 1 capsule (300 mg total)  by mouth every 12 (twelve) hours. (Patient not taking: Reported on 10/03/2017)   . diltiazem (CARDIZEM CD) 240 MG 24 hr capsule Take 1 capsule (240 mg total) by mouth daily. 09/15/2017: LF on 08-09-2017 90 day supply per [facility]  . polyethylene glycol powder (GLYCOLAX/MIRALAX) powder MIX 1 CAPFUL (17GM) IN LIQUID AND DRINK EVERY DAY (Patient not taking: Reported on 04/06/2017)    No facility-administered encounter medications on file as of 10/03/2017.     Functional Status:   In your present state of health, do you have any difficulty performing the  following activities: 09/15/2017 06/20/2017  Hearing? N N  Vision? N N  Comment - reading  Difficulty concentrating or making decisions? N N  Walking or climbing stairs? Y Y  Dressing or bathing? N N  Doing errands, shopping? Y Y  Comment "I don't drive" daughters take patient  Preparing Food and eating ? - N  Using the Toilet? - N  In the past six months, have you accidently leaked urine? - N  Do you have problems with loss of bowel control? - N  Managing your Medications? - N  Managing your Finances? - N  Housekeeping or managing your Housekeeping? - Y  Some recent data might be hidden    Fall/Depression Screening:    Fall Risk  06/20/2017 03/09/2017 02/02/2017  Falls in the past year? No No No  Number falls in past yr: - - -  Injury with Fall? - - -  Risk Factor Category  - - -  Risk for fall due to : Impaired balance/gait;Impaired mobility - -  Risk for fall due to: Comment - - -  Follow up - - -   PHQ 2/9 Scores 06/20/2017 03/09/2017 02/02/2017 12/23/2016 12/01/2016 11/15/2016 11/11/2016  PHQ - 2 Score 0 0 0 0 0 0 0    Assessment:    Met with member at scheduled time.  Daughter and home health nurse present for visit.  Home health nurse [facility]) will do una boots for increased swelling on her legs as well as wound care for sacral wound.  She confirms that member is to have PT and home aide, both of which has not started yet but will within the next few days.  Member report she has a wheelchair that has been provided by [place], son will pick up.  Concern still remain that member is unable to get out of home for MD appointment due to not having a ramp.  She is not very interested in physician's making house calls due to additional cost, but will consider.  Aware that this care manager will contact LCSW for other resources.  She has not been weighing due to decrease mobility, but is looking forward to working with PT to improve.  She stopped taking her Cardizem, state it was  making her "sick."  Advised to notify primary MD, also advised to schedule appointment with cardiologist as she has not seen him since 11/2016.    Denies any urgent concerns at this time, advised to contact with questions.  Plan:   Will follow up with member next week. Will place referral to LCSW for resources for ramp.  THN CM Care Plan Problem One     Most Recent Value  Care Plan Problem One  Risk for readmission related to heart failure management as evidenced by recent hospitalization  Role Documenting the Problem One  Care Management [place] for Problem One  Active  [facility] Long Term  Goal   Patient will not have any admission for Congestive Heart Failure within the next 31 days  THN Long Term Goal Start Date  09/25/17  Interventions for Problem One Long Term Goal  Collaborated with home health RN regarding plan of care.  Member educated on importance of adhering to plan of care, especially diet and increased mobility.  THN CM Short Term Goal #1   Patient will weigh daily and record within the next 30 days  THN CM Short Term Goal #1 Start Date  09/25/17  Interventions for Short Term Goal #1  Advised of importance of working with PT in effort to increase strength to be able to perform daily weights.  THN CM Short Term Goal #2   Member will report taking medications daily as prescribed over the next 4 weeks  THN CM Short Term Goal #2 Start Date  09/25/17  Interventions for Short Term Goal #2  Medications in the home reviewed against list.  Advised to discuss concern regarding cardizem with MD.  [facility] CM Short Term Goal #3  Member will keep and attend appointment with primary MD this week  THN CM Short Term Goal #3 Start Date  10/03/17  [facility] CM Short Term Goal #3 Met Date  -- [Not met, date reset]  Interventions for Short Tern Goal #3  Member provided with options of having MD visit the home. Advised to consider physicians making house calls     Valente David, RN, MSN [place] Manager [phone]

## 2017-10-04 ENCOUNTER — Ambulatory Visit: Payer: Medicare HMO

## 2017-10-04 ENCOUNTER — Encounter: Payer: Self-pay | Admitting: *Deleted

## 2017-10-05 DIAGNOSIS — N183 Chronic kidney disease, stage 3 (moderate): Secondary | ICD-10-CM | POA: Diagnosis not present

## 2017-10-05 DIAGNOSIS — J449 Chronic obstructive pulmonary disease, unspecified: Secondary | ICD-10-CM | POA: Diagnosis not present

## 2017-10-05 DIAGNOSIS — I4891 Unspecified atrial fibrillation: Secondary | ICD-10-CM | POA: Diagnosis not present

## 2017-10-05 DIAGNOSIS — L97821 Non-pressure chronic ulcer of other part of left lower leg limited to breakdown of skin: Secondary | ICD-10-CM | POA: Diagnosis not present

## 2017-10-05 DIAGNOSIS — I130 Hypertensive heart and chronic kidney disease with heart failure and stage 1 through stage 4 chronic kidney disease, or unspecified chronic kidney disease: Secondary | ICD-10-CM | POA: Diagnosis not present

## 2017-10-05 DIAGNOSIS — E1122 Type 2 diabetes mellitus with diabetic chronic kidney disease: Secondary | ICD-10-CM | POA: Diagnosis not present

## 2017-10-05 DIAGNOSIS — I872 Venous insufficiency (chronic) (peripheral): Secondary | ICD-10-CM | POA: Diagnosis not present

## 2017-10-05 DIAGNOSIS — I5032 Chronic diastolic (congestive) heart failure: Secondary | ICD-10-CM | POA: Diagnosis not present

## 2017-10-05 DIAGNOSIS — L89322 Pressure ulcer of left buttock, stage 2: Secondary | ICD-10-CM | POA: Diagnosis not present

## 2017-10-06 DIAGNOSIS — L97821 Non-pressure chronic ulcer of other part of left lower leg limited to breakdown of skin: Secondary | ICD-10-CM | POA: Diagnosis not present

## 2017-10-06 DIAGNOSIS — I4891 Unspecified atrial fibrillation: Secondary | ICD-10-CM | POA: Diagnosis not present

## 2017-10-06 DIAGNOSIS — I5032 Chronic diastolic (congestive) heart failure: Secondary | ICD-10-CM | POA: Diagnosis not present

## 2017-10-06 DIAGNOSIS — I130 Hypertensive heart and chronic kidney disease with heart failure and stage 1 through stage 4 chronic kidney disease, or unspecified chronic kidney disease: Secondary | ICD-10-CM | POA: Diagnosis not present

## 2017-10-06 DIAGNOSIS — E1122 Type 2 diabetes mellitus with diabetic chronic kidney disease: Secondary | ICD-10-CM | POA: Diagnosis not present

## 2017-10-06 DIAGNOSIS — J449 Chronic obstructive pulmonary disease, unspecified: Secondary | ICD-10-CM | POA: Diagnosis not present

## 2017-10-06 DIAGNOSIS — L89322 Pressure ulcer of left buttock, stage 2: Secondary | ICD-10-CM | POA: Diagnosis not present

## 2017-10-06 DIAGNOSIS — N183 Chronic kidney disease, stage 3 (moderate): Secondary | ICD-10-CM | POA: Diagnosis not present

## 2017-10-06 DIAGNOSIS — I872 Venous insufficiency (chronic) (peripheral): Secondary | ICD-10-CM | POA: Diagnosis not present

## 2017-10-07 DIAGNOSIS — L97821 Non-pressure chronic ulcer of other part of left lower leg limited to breakdown of skin: Secondary | ICD-10-CM | POA: Diagnosis not present

## 2017-10-07 DIAGNOSIS — I130 Hypertensive heart and chronic kidney disease with heart failure and stage 1 through stage 4 chronic kidney disease, or unspecified chronic kidney disease: Secondary | ICD-10-CM | POA: Diagnosis not present

## 2017-10-07 DIAGNOSIS — I872 Venous insufficiency (chronic) (peripheral): Secondary | ICD-10-CM | POA: Diagnosis not present

## 2017-10-07 DIAGNOSIS — J449 Chronic obstructive pulmonary disease, unspecified: Secondary | ICD-10-CM | POA: Diagnosis not present

## 2017-10-07 DIAGNOSIS — E1122 Type 2 diabetes mellitus with diabetic chronic kidney disease: Secondary | ICD-10-CM | POA: Diagnosis not present

## 2017-10-07 DIAGNOSIS — I5032 Chronic diastolic (congestive) heart failure: Secondary | ICD-10-CM | POA: Diagnosis not present

## 2017-10-07 DIAGNOSIS — L89322 Pressure ulcer of left buttock, stage 2: Secondary | ICD-10-CM | POA: Diagnosis not present

## 2017-10-07 DIAGNOSIS — I4891 Unspecified atrial fibrillation: Secondary | ICD-10-CM | POA: Diagnosis not present

## 2017-10-07 DIAGNOSIS — N183 Chronic kidney disease, stage 3 (moderate): Secondary | ICD-10-CM | POA: Diagnosis not present

## 2017-10-09 ENCOUNTER — Ambulatory Visit: Payer: Self-pay | Admitting: *Deleted

## 2017-10-09 DIAGNOSIS — J449 Chronic obstructive pulmonary disease, unspecified: Secondary | ICD-10-CM | POA: Diagnosis not present

## 2017-10-09 DIAGNOSIS — I5032 Chronic diastolic (congestive) heart failure: Secondary | ICD-10-CM | POA: Diagnosis not present

## 2017-10-09 DIAGNOSIS — N183 Chronic kidney disease, stage 3 (moderate): Secondary | ICD-10-CM | POA: Diagnosis not present

## 2017-10-09 DIAGNOSIS — I4891 Unspecified atrial fibrillation: Secondary | ICD-10-CM | POA: Diagnosis not present

## 2017-10-09 DIAGNOSIS — L89322 Pressure ulcer of left buttock, stage 2: Secondary | ICD-10-CM | POA: Diagnosis not present

## 2017-10-09 DIAGNOSIS — I872 Venous insufficiency (chronic) (peripheral): Secondary | ICD-10-CM | POA: Diagnosis not present

## 2017-10-09 DIAGNOSIS — I130 Hypertensive heart and chronic kidney disease with heart failure and stage 1 through stage 4 chronic kidney disease, or unspecified chronic kidney disease: Secondary | ICD-10-CM | POA: Diagnosis not present

## 2017-10-09 DIAGNOSIS — L97821 Non-pressure chronic ulcer of other part of left lower leg limited to breakdown of skin: Secondary | ICD-10-CM | POA: Diagnosis not present

## 2017-10-09 DIAGNOSIS — E1122 Type 2 diabetes mellitus with diabetic chronic kidney disease: Secondary | ICD-10-CM | POA: Diagnosis not present

## 2017-10-10 ENCOUNTER — Other Ambulatory Visit: Payer: Self-pay | Admitting: *Deleted

## 2017-10-10 ENCOUNTER — Ambulatory Visit: Payer: Medicare HMO

## 2017-10-10 DIAGNOSIS — N183 Chronic kidney disease, stage 3 (moderate): Secondary | ICD-10-CM | POA: Diagnosis not present

## 2017-10-10 DIAGNOSIS — I4891 Unspecified atrial fibrillation: Secondary | ICD-10-CM | POA: Diagnosis not present

## 2017-10-10 DIAGNOSIS — I5032 Chronic diastolic (congestive) heart failure: Secondary | ICD-10-CM | POA: Diagnosis not present

## 2017-10-10 DIAGNOSIS — L97821 Non-pressure chronic ulcer of other part of left lower leg limited to breakdown of skin: Secondary | ICD-10-CM | POA: Diagnosis not present

## 2017-10-10 DIAGNOSIS — J449 Chronic obstructive pulmonary disease, unspecified: Secondary | ICD-10-CM | POA: Diagnosis not present

## 2017-10-10 DIAGNOSIS — I872 Venous insufficiency (chronic) (peripheral): Secondary | ICD-10-CM | POA: Diagnosis not present

## 2017-10-10 DIAGNOSIS — I130 Hypertensive heart and chronic kidney disease with heart failure and stage 1 through stage 4 chronic kidney disease, or unspecified chronic kidney disease: Secondary | ICD-10-CM | POA: Diagnosis not present

## 2017-10-10 DIAGNOSIS — E1122 Type 2 diabetes mellitus with diabetic chronic kidney disease: Secondary | ICD-10-CM | POA: Diagnosis not present

## 2017-10-10 DIAGNOSIS — L89322 Pressure ulcer of left buttock, stage 2: Secondary | ICD-10-CM | POA: Diagnosis not present

## 2017-10-10 NOTE — Patient Outreach (Signed)
[facility]) Care Management  10/10/2017  Jamie Mason Feb 18, 1941 [id number]   Weekly transition of care call placed to member.  She report she is "doing alright" but admits that she is still unable to walk.  She remains confined to her chair, where she sleeps.  She is able to stand and pivot to the [facility] with assistance.  She canceled her follow up appointment again today (2nd one canceled since discharge) due to not being able to walk.  She is aware that referral has been placed to social worker to help with resources for possible ramp, which would help her get to her appointments.  She state she has someone coming today from [place] for assessment of the home as well as assessment of her [facility] unit.    Concern for member's steady health decline discussed.  Introduced the possibility of involvement with [facility], she declines.  She feel she will do much better with the help of home health PT.  She denies any shortness of breath but remain unable to weigh.  Denies any urgent concerns, will follow up next week.  THN CM Care Plan Problem One     Most Recent Value  Care Plan Problem One  Risk for readmission related to heart failure management as evidenced by recent hospitalization  Role Documenting the Problem One  Care Management [place] for Problem One  Active  [facility] Long Term Goal   Patient will not have any admission for Congestive Heart Failure within the next 31 days  THN Long Term Goal Start Date  09/25/17  Interventions for Problem One Long Term Goal  Member educated on Palliative care/care connections, advised to consider  [facility]N CM Short Term Goal #1   Patient will weigh daily and record within the next 30 days  [facility] CM Short Term Goal #1 Start Date  09/25/17  Interventions for Short Term Goal #1  Member educated on correlation of working with PT and increasing strength in effort to achieve goal of being able to weigh independently  [facility]N CM Short  Term Goal #2   Member will report taking medications daily as prescribed over the next 4 weeks  THN CM Short Term Goal #2 Start Date  09/25/17  [facility] CM Short Term Goal #2 Met Date  10/10/17  Interventions for Short Term Goal #2  Report she is taking medications  THN CM Short Term Goal #3  Member will keep and attend appointment with primary MD this week  [facility]N CM Short Term Goal #3 Start Date  10/10/17  [facility] CM Short Term Goal #3 Met Date  -- [Date reset again]  Interventions for Short Tern Goal #3  Provided member with contact information for doctor's making house calls     Valente David, Therapist, sports, MSN [place] Manager [phone]

## 2017-10-11 ENCOUNTER — Other Ambulatory Visit: Payer: Self-pay | Admitting: *Deleted

## 2017-10-11 ENCOUNTER — Encounter (HOSPITAL_COMMUNITY): Payer: Medicare HMO

## 2017-10-11 ENCOUNTER — Ambulatory Visit: Payer: Medicare HMO | Admitting: Vascular Surgery

## 2017-10-11 DIAGNOSIS — L89322 Pressure ulcer of left buttock, stage 2: Secondary | ICD-10-CM | POA: Diagnosis not present

## 2017-10-11 DIAGNOSIS — J449 Chronic obstructive pulmonary disease, unspecified: Secondary | ICD-10-CM | POA: Diagnosis not present

## 2017-10-11 DIAGNOSIS — E1122 Type 2 diabetes mellitus with diabetic chronic kidney disease: Secondary | ICD-10-CM | POA: Diagnosis not present

## 2017-10-11 DIAGNOSIS — R0609 Other forms of dyspnea: Principal | ICD-10-CM

## 2017-10-11 DIAGNOSIS — I5032 Chronic diastolic (congestive) heart failure: Secondary | ICD-10-CM | POA: Diagnosis not present

## 2017-10-11 DIAGNOSIS — E8881 Metabolic syndrome: Secondary | ICD-10-CM

## 2017-10-11 DIAGNOSIS — L97821 Non-pressure chronic ulcer of other part of left lower leg limited to breakdown of skin: Secondary | ICD-10-CM | POA: Diagnosis not present

## 2017-10-11 DIAGNOSIS — I130 Hypertensive heart and chronic kidney disease with heart failure and stage 1 through stage 4 chronic kidney disease, or unspecified chronic kidney disease: Secondary | ICD-10-CM | POA: Diagnosis not present

## 2017-10-11 DIAGNOSIS — I872 Venous insufficiency (chronic) (peripheral): Secondary | ICD-10-CM | POA: Diagnosis not present

## 2017-10-11 DIAGNOSIS — N183 Chronic kidney disease, stage 3 (moderate): Secondary | ICD-10-CM | POA: Diagnosis not present

## 2017-10-11 DIAGNOSIS — I4891 Unspecified atrial fibrillation: Secondary | ICD-10-CM | POA: Diagnosis not present

## 2017-10-11 MED ORDER — METFORMIN HCL 500 MG PO TABS: 500.0000 mg | ORAL_TABLET | Freq: Two times a day (BID) | ORAL | 3 refills | Status: DC

## 2017-10-11 MED ORDER — ALBUTEROL SULFATE HFA 108 (90 BASE) MCG/ACT IN AERS: INHALATION_SPRAY | RESPIRATORY_TRACT | 5 refills | Status: DC

## 2017-10-11 NOTE — Telephone Encounter (Signed)
Courtesy call made to patient-she's unable to make an appt at this time as she is unable to walk.  Prior to hospital discharge, pt states they were trying to get her in a rehab facility, but unable to due to a waiting list.  Pt states she is currently getting PT at home and will make an appt with [facility] once she regains her strength.Kingsley Spittle Cassady5/1/20194:46 PM

## 2017-10-12 DIAGNOSIS — J449 Chronic obstructive pulmonary disease, unspecified: Secondary | ICD-10-CM | POA: Diagnosis not present

## 2017-10-12 DIAGNOSIS — L97821 Non-pressure chronic ulcer of other part of left lower leg limited to breakdown of skin: Secondary | ICD-10-CM | POA: Diagnosis not present

## 2017-10-12 DIAGNOSIS — E1122 Type 2 diabetes mellitus with diabetic chronic kidney disease: Secondary | ICD-10-CM | POA: Diagnosis not present

## 2017-10-12 DIAGNOSIS — I130 Hypertensive heart and chronic kidney disease with heart failure and stage 1 through stage 4 chronic kidney disease, or unspecified chronic kidney disease: Secondary | ICD-10-CM | POA: Diagnosis not present

## 2017-10-12 DIAGNOSIS — I4891 Unspecified atrial fibrillation: Secondary | ICD-10-CM | POA: Diagnosis not present

## 2017-10-12 DIAGNOSIS — N183 Chronic kidney disease, stage 3 (moderate): Secondary | ICD-10-CM | POA: Diagnosis not present

## 2017-10-12 DIAGNOSIS — I872 Venous insufficiency (chronic) (peripheral): Secondary | ICD-10-CM | POA: Diagnosis not present

## 2017-10-12 DIAGNOSIS — L89322 Pressure ulcer of left buttock, stage 2: Secondary | ICD-10-CM | POA: Diagnosis not present

## 2017-10-12 DIAGNOSIS — I5032 Chronic diastolic (congestive) heart failure: Secondary | ICD-10-CM | POA: Diagnosis not present

## 2017-10-13 ENCOUNTER — Other Ambulatory Visit: Payer: Self-pay

## 2017-10-13 NOTE — Patient Outreach (Signed)
[facility]) Care Management  10/13/2017  Jamie Mason 06-23-1940 [id number]  BSW contacted patient on today's date to follow up on referral to social work due to patients need for a wheelchair ramp. HIPAA identifiers verified. Patient confirmed a house assessment was completed by [employer] on 10/10/17. Patient still awaiting results of assessment.   BSW spoke with patient about other possible community resources to assist with building a ramp if [employer] is not able to help at this time. Patient agreeable to BSW contacting [employer] to inquire about the status of her referral.   BSW contacted family service coordinator, [place] at [phone] to follow up on patients referral. BSW able to leave a HIPAA compliant voice message requesting a return call to discuss patients referral.  Plan: BSW to follow up with Stark Bray at [employer] early next week if return call not received. BSW to contact patient with referral status once obtained.  [name], BSW, CDP Social Worker Cell # [phone] [name]

## 2017-10-17 ENCOUNTER — Other Ambulatory Visit: Payer: Self-pay

## 2017-10-17 ENCOUNTER — Telehealth: Payer: Self-pay | Admitting: *Deleted

## 2017-10-17 ENCOUNTER — Other Ambulatory Visit: Payer: Self-pay | Admitting: *Deleted

## 2017-10-17 DIAGNOSIS — N183 Chronic kidney disease, stage 3 (moderate): Secondary | ICD-10-CM | POA: Diagnosis not present

## 2017-10-17 DIAGNOSIS — E1122 Type 2 diabetes mellitus with diabetic chronic kidney disease: Secondary | ICD-10-CM | POA: Diagnosis not present

## 2017-10-17 DIAGNOSIS — J449 Chronic obstructive pulmonary disease, unspecified: Secondary | ICD-10-CM | POA: Diagnosis not present

## 2017-10-17 DIAGNOSIS — Z9114 Patient's other noncompliance with medication regimen: Secondary | ICD-10-CM

## 2017-10-17 DIAGNOSIS — L97821 Non-pressure chronic ulcer of other part of left lower leg limited to breakdown of skin: Secondary | ICD-10-CM | POA: Diagnosis not present

## 2017-10-17 DIAGNOSIS — I872 Venous insufficiency (chronic) (peripheral): Secondary | ICD-10-CM | POA: Diagnosis not present

## 2017-10-17 DIAGNOSIS — I130 Hypertensive heart and chronic kidney disease with heart failure and stage 1 through stage 4 chronic kidney disease, or unspecified chronic kidney disease: Secondary | ICD-10-CM | POA: Diagnosis not present

## 2017-10-17 DIAGNOSIS — L89322 Pressure ulcer of left buttock, stage 2: Secondary | ICD-10-CM | POA: Diagnosis not present

## 2017-10-17 DIAGNOSIS — I5032 Chronic diastolic (congestive) heart failure: Secondary | ICD-10-CM | POA: Diagnosis not present

## 2017-10-17 DIAGNOSIS — I4891 Unspecified atrial fibrillation: Secondary | ICD-10-CM | POA: Diagnosis not present

## 2017-10-17 NOTE — Patient Outreach (Signed)
[facility]) Care Management  10/17/2017  Jamie Mason Jan 13, 1941 [id number]   Weekly transition of care call placed to member.  She report she is doing better.  State she is now able to get to her bedroom with her wheelchair and walk a few steps to the bed using the walker.  She no longer has the hospital bed as she state she was unable to sleep on the mattress.  She still has not had a follow up appointment, but has been contacted by [facility] BSW regarding a ramp.  She is unsure when one will be built.  Using [employer] is not an option for transportation due to cost.  She state she has contacted [employer], but was confused when she was told she would need to complete application.  Advised that per website, registration can be completed over the phone.  Provided again with contact information, she state she will call again to register and inquire about home visit.  She denies any further concerns, will make home visit next week.  Call placed to H. Buena Irish with [facility] to discuss concern of member's declining health, immobility and inability to have MD follow up complete.  She is updated on interventions done thus far, no additional suggestions but will discuss with primary MD.  [facility] CM Care Plan Problem One     Most Recent Value  Care Plan Problem One  Risk for readmission related to heart failure management as evidenced by recent hospitalization  Role Documenting the Problem One  Care Management [place] for Problem One  Active  [facility] Long Term Goal   Patient will not have any admission for Congestive Heart Failure within the next 31 days  THN Long Term Goal Start Date  09/25/17  Interventions for Problem One Long Term Goal  Call placed to H. Buena Irish to collaborate on plan of care.  Member provided again with information for doctors making house calls.  Advised to call to perform registration over the phone and schedule visit.  [facility] CM  Short Term Goal #1   Patient will weigh daily and record within the next 30 days  [facility] CM Short Term Goal #1 Start Date  09/25/17  Interventions for Short Term Goal #1  Educated on importance of compliance with exercises and interaction with PT in effort to increase strenght to be able to stand independently for weights  [facility] CM Short Term Goal #3  Member will keep and attend appointment with primary MD this week  [facility] CM Short Term Goal #3 Start Date  10/10/17  [facility] CM Short Term Goal #3 Met Date  -- [Date reset again]     Valente David, RN, MSN [place] Manager [phone]

## 2017-10-17 NOTE — Telephone Encounter (Signed)
Monica l. RN [facility]N calls to say pt cannot get out of house at this point because of lack of a ramp for wh/ch, [facility] is in process of getting a ramp installed, no date of possible completion. She has suggested pt use doctors that make housecalls, this will be added expense for pt. Pt also is not taking her meds appropriately . She ask for suggestions and unfortunately triage has none. If lack of self care continues APS may be an answer but not at this time. This is a Financial planner. Maxine Glenn will call to update dr Midwife

## 2017-10-17 NOTE — Patient Outreach (Signed)
[facility]) Care Management  10/17/2017  GERAL TUCH 02/27/1941 [id number]   BSW attempted to contact [place], family services coordinator with [facility] to follow up on the status of ramp referral. BSW left Ms. Hopkins a voice message requesting a return call. BSW also sent Ms. Hopkins an e-mail in regards to patient's referral.  BSW also contacted patient on today's date. HIPAA identifiers verified. Patient states she has not heard back from [employer]. Patient has yet to schedule a PCP appointment due to inability to leave home without a ramp. Patient stated a home health nurse is scheduled to visit her today.  Plan: BSW to follow-up with [employer] in the next 3-4 days if Ms. Hopkins does not respond to today's outreach.  [name], BSW, CDP Social Worker Cell # [phone] [name]

## 2017-10-17 NOTE — Patient Outreach (Signed)
[facility]) Care Management  10/17/2017  Jamie Mason 08-27-1940 [id number]  BSW received a return call from [place] with community housing solutions. Stark Bray stated no one from [facility] has visited patients home to assess needs of ramp referral. Lynda plans to outreach patient today in attempts to assess needs and schedule home assessment with [facility].  [name], BSW, CDP Social Worker Cell # [phone] [name]

## 2017-10-18 IMAGING — CR DG CHEST 1V PORT
2 series · 2 of 2 positions shown · non-contrast
Comparison: 07/06/2016

CLINICAL DATA: Respiratory distress

EXAM:
PORTABLE CHEST 1 VIEW

[AP (1 of 2)]
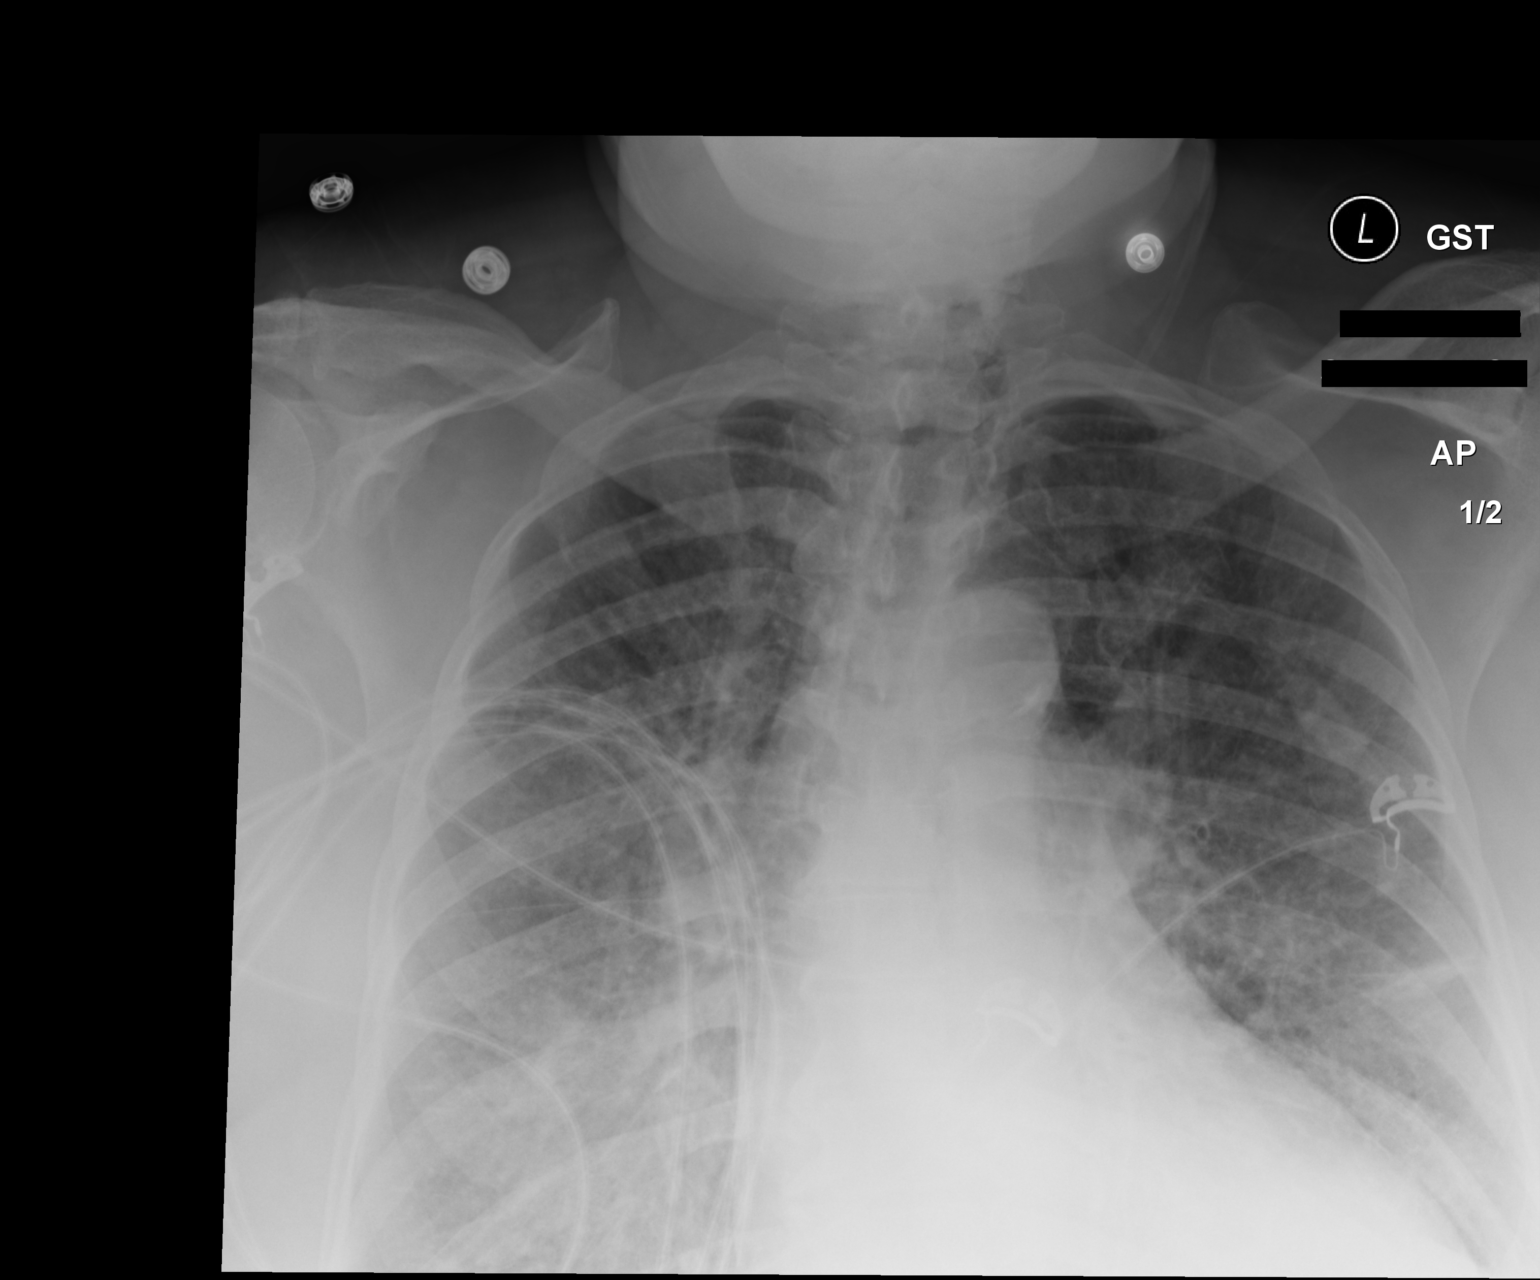

[AP (2 of 2)]
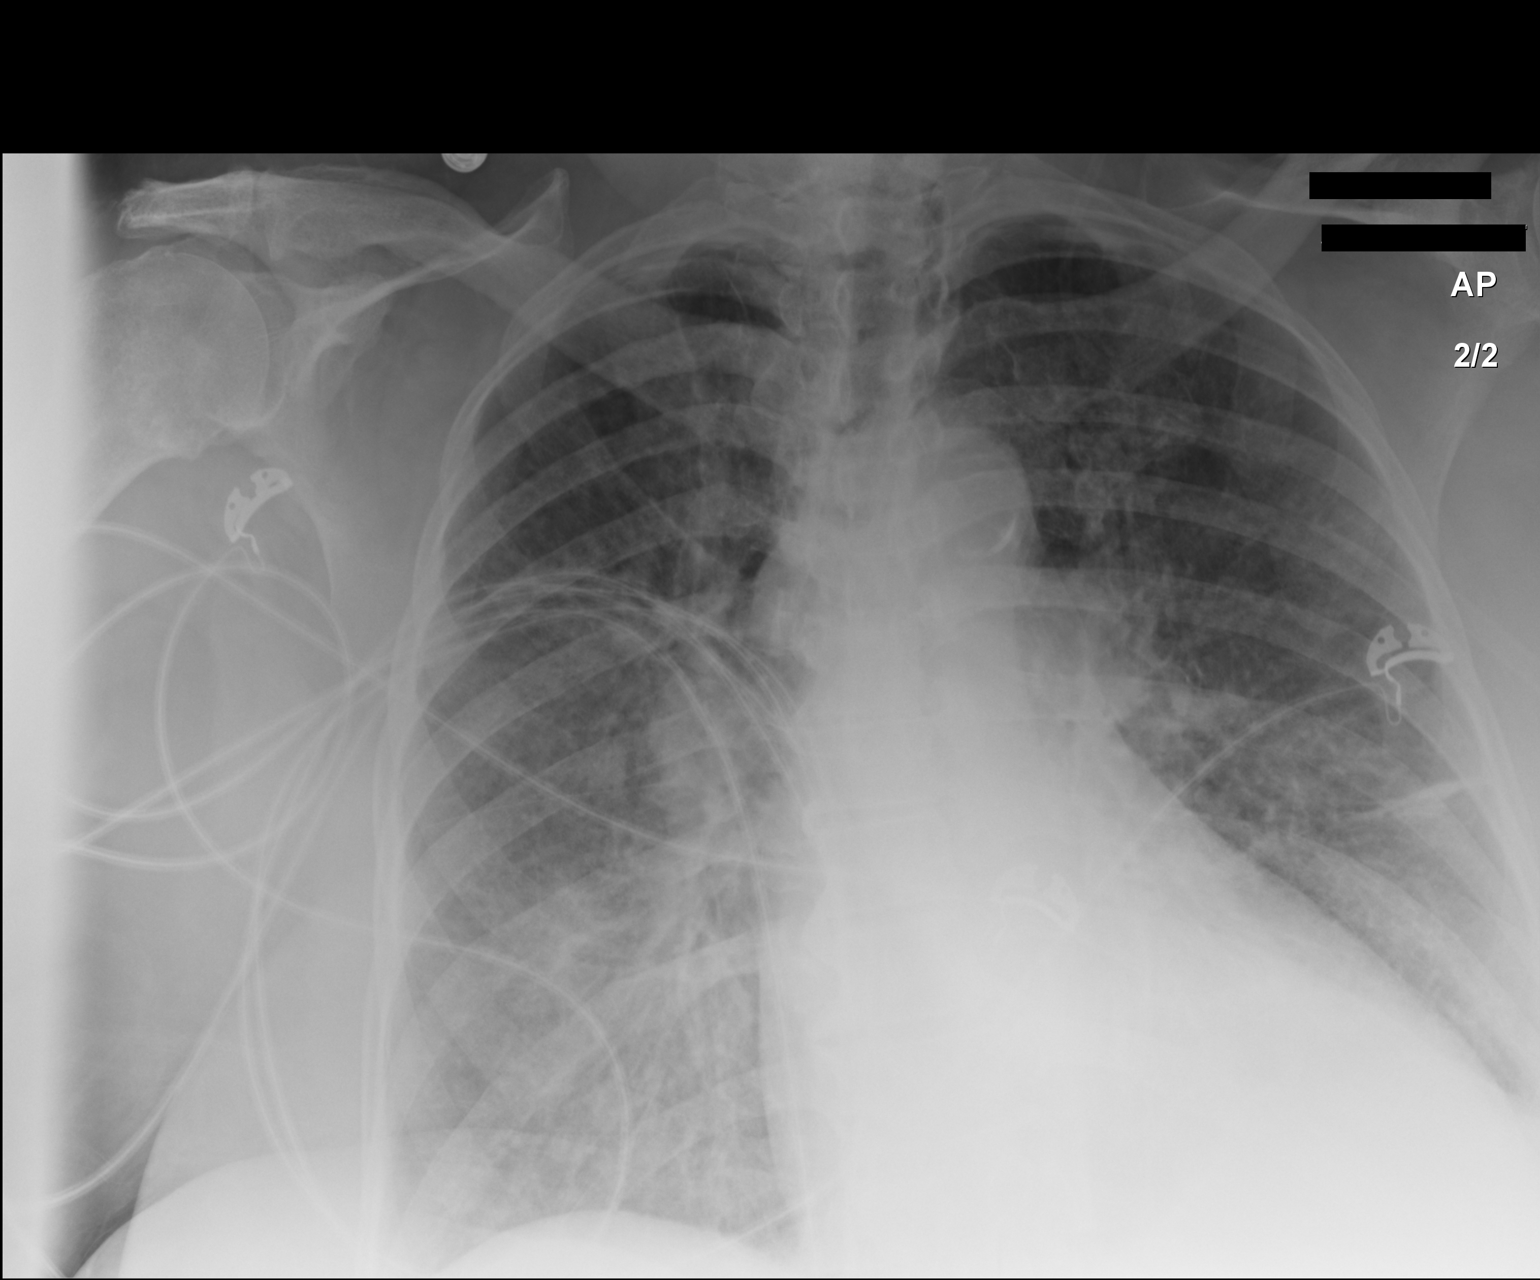

[2 of 2 positions shown; findings below may reference images not displayed]

FINDINGS: Stable cardiopericardial enlargement. Hazy opacity bilaterally,
borderline improved. There is cephalized blood flow. Mild
atelectasis or scarring the left mid lung. No visible effusion or
pneumothorax.
IMPRESSION: CHF pattern with borderline improvement since yesterday.

## 2017-10-18 NOTE — Telephone Encounter (Signed)
Sent to [employer]. RN [facility] Please use pallative care services thru care connections this is the preferred pallative services

## 2017-10-18 NOTE — Telephone Encounter (Signed)
I put in referral to palliative care. The sooner they get involved, if she qualifies, the better.

## 2017-10-19 DIAGNOSIS — I872 Venous insufficiency (chronic) (peripheral): Secondary | ICD-10-CM | POA: Diagnosis not present

## 2017-10-19 DIAGNOSIS — J449 Chronic obstructive pulmonary disease, unspecified: Secondary | ICD-10-CM | POA: Diagnosis not present

## 2017-10-19 DIAGNOSIS — I130 Hypertensive heart and chronic kidney disease with heart failure and stage 1 through stage 4 chronic kidney disease, or unspecified chronic kidney disease: Secondary | ICD-10-CM | POA: Diagnosis not present

## 2017-10-19 DIAGNOSIS — L97821 Non-pressure chronic ulcer of other part of left lower leg limited to breakdown of skin: Secondary | ICD-10-CM | POA: Diagnosis not present

## 2017-10-19 DIAGNOSIS — E1122 Type 2 diabetes mellitus with diabetic chronic kidney disease: Secondary | ICD-10-CM | POA: Diagnosis not present

## 2017-10-19 DIAGNOSIS — N183 Chronic kidney disease, stage 3 (moderate): Secondary | ICD-10-CM | POA: Diagnosis not present

## 2017-10-19 DIAGNOSIS — I4891 Unspecified atrial fibrillation: Secondary | ICD-10-CM | POA: Diagnosis not present

## 2017-10-19 DIAGNOSIS — I5032 Chronic diastolic (congestive) heart failure: Secondary | ICD-10-CM | POA: Diagnosis not present

## 2017-10-19 DIAGNOSIS — L89322 Pressure ulcer of left buttock, stage 2: Secondary | ICD-10-CM | POA: Diagnosis not present

## 2017-10-20 ENCOUNTER — Other Ambulatory Visit: Payer: Self-pay

## 2017-10-20 NOTE — Telephone Encounter (Signed)
Spoke with [facility] @ [facility] who will be contacting the patient to get her set up.

## 2017-10-20 NOTE — Patient Outreach (Signed)
[facility]) Care Management  10/20/2017  Jamie Mason 1940/12/07 [id number]  BSW attempted to contact patient to follow-up on [employer] referral. Unfortunately, patient did not answer and the voice mailbox was full prohibiting BSW from leave a voice message requesting a return call.  BSW also attempted to contact [employer] with [employer] to inquire about the status of patient's referral. Call was unsuccessful. BSW left a HIPAA compliant voice message requesting a return call.  Plan: BSW to follow up with both patient and [employer] next week to assess status of referral.  [name], BSW, CDP Social Worker Cell # [phone] [name]

## 2017-10-23 ENCOUNTER — Other Ambulatory Visit: Payer: Self-pay

## 2017-10-23 NOTE — Patient Outreach (Signed)
[facility]) Care Management  10/23/2017  [name] 1941-06-03 [id number]  BSW recevied a return call from [facility] with [employer] to follow up on patient's referral status. Jamie Mason stated she has been able to make contact with patient but is unsure if patient will qualify for their services. Patient was able to verify both the patient and the patient's husbands income. However, patient's daughter also resides in the home and patient was unable to disclose her income. Jamie Mason also stated that due to patient's active bankruptcy status she is unsure if [employer] will be able to build patient a ramp due to risk of losing home and resource.  Plan: Jamie Mason to discuss patient with her team and determine if a temporary ramp could be supplied due to patients need. BSW to contact patient within the next week to continue discussing community resource needs.  [name], BSW, CDP Social Worker Cell # [phone] [name]

## 2017-10-24 ENCOUNTER — Ambulatory Visit: Payer: Self-pay | Admitting: *Deleted

## 2017-10-24 ENCOUNTER — Other Ambulatory Visit: Payer: Self-pay | Admitting: *Deleted

## 2017-10-24 ENCOUNTER — Telehealth: Payer: Self-pay

## 2017-10-24 DIAGNOSIS — L89322 Pressure ulcer of left buttock, stage 2: Secondary | ICD-10-CM | POA: Diagnosis not present

## 2017-10-24 DIAGNOSIS — I5032 Chronic diastolic (congestive) heart failure: Secondary | ICD-10-CM | POA: Diagnosis not present

## 2017-10-24 DIAGNOSIS — J449 Chronic obstructive pulmonary disease, unspecified: Secondary | ICD-10-CM | POA: Diagnosis not present

## 2017-10-24 DIAGNOSIS — I4891 Unspecified atrial fibrillation: Secondary | ICD-10-CM | POA: Diagnosis not present

## 2017-10-24 DIAGNOSIS — N183 Chronic kidney disease, stage 3 (moderate): Secondary | ICD-10-CM | POA: Diagnosis not present

## 2017-10-24 DIAGNOSIS — I130 Hypertensive heart and chronic kidney disease with heart failure and stage 1 through stage 4 chronic kidney disease, or unspecified chronic kidney disease: Secondary | ICD-10-CM | POA: Diagnosis not present

## 2017-10-24 DIAGNOSIS — L97821 Non-pressure chronic ulcer of other part of left lower leg limited to breakdown of skin: Secondary | ICD-10-CM | POA: Diagnosis not present

## 2017-10-24 DIAGNOSIS — E1122 Type 2 diabetes mellitus with diabetic chronic kidney disease: Secondary | ICD-10-CM | POA: Diagnosis not present

## 2017-10-24 DIAGNOSIS — I872 Venous insufficiency (chronic) (peripheral): Secondary | ICD-10-CM | POA: Diagnosis not present

## 2017-10-24 NOTE — Telephone Encounter (Signed)
Called [facility] - no answer nor VM; unable to leave message.

## 2017-10-24 NOTE — Telephone Encounter (Signed)
[facility] with [facility] requesting a stop on the unna boot wrap since the leg has heal. Please call back.

## 2017-10-24 NOTE — Patient Outreach (Signed)
[place] [facility]) Care Management  10/24/2017  Jamie Mason September 12, 1940 [id number]   Call placed to member to verify she would be available for home visit this morning, she state the PT will be there at same time this care manager is scheduled.  She request this care manager to come later, but unable to do so due to scheduling conflict.  She denies any complaints of pain or discomfort, denies shortness of breath.  State she has continued to work with PT and home health, but still has not been able to stand for weights.    Report she was able to schedule home visit with [employer] for tomorrow.  She is unsure if there will be an additional copay.  She does state that she would like to improve so she can get back to her assigned primary MD.  Advised that after collaboration, decision was made to place referral to [facility].  She denies being contacted by them as of yet.    State she has been compliant with medications, denies concerns.  Advised to contact this care manager with questions.  Will follow up next week.  [facility] CM Care Plan Problem One     Most Recent Value  Care Plan Problem One  Risk for readmission related to heart failure management as evidenced by recent hospitalization  Role Documenting the Problem One  Care Management [place] for Problem One  Active  [facility] Long Term Goal   Patient will not have any admission for Congestive Heart Failure within the next 31 days  [facility] Long Term Goal Start Date  09/25/17  [facility] for Problem One Long Term Goal  Advised member that referral has been placed to [facility] for assessment.  Will contact [facility] to inquire about timeline of contact.  [facility] CM Short Term Goal #1   Patient will weigh daily and record within the next 30 days  [facility] CM Short Term Goal #1 Start Date  09/25/17  [facility] CM Short Term Goal #1 Met Date  -- [Not met]  [facility] CM Short Term Goal #2   Member will report  completion of visit with MD within the next week  [facility] CM Short Term Goal #2 Start Date  10/24/17  Interventions for Short Term Goal #2  Confirmed with member that she has home visit scheduled with [facility] house calls.  [facility] CM Short Term Goal #3  Member will keep and attend appointment with primary MD this week  [facility] CM Short Term Goal #3 Start Date  10/10/17  [facility] CM Short Term Goal #3 Met Date  -- [Goal not met]     Valente David, RN, MSN [place] [phone]

## 2017-10-25 ENCOUNTER — Ambulatory Visit: Payer: Self-pay

## 2017-10-25 DIAGNOSIS — N183 Chronic kidney disease, stage 3 (moderate): Secondary | ICD-10-CM | POA: Diagnosis not present

## 2017-10-25 DIAGNOSIS — I10 Essential (primary) hypertension: Secondary | ICD-10-CM | POA: Diagnosis not present

## 2017-10-25 DIAGNOSIS — E1122 Type 2 diabetes mellitus with diabetic chronic kidney disease: Secondary | ICD-10-CM | POA: Diagnosis not present

## 2017-10-25 DIAGNOSIS — L89322 Pressure ulcer of left buttock, stage 2: Secondary | ICD-10-CM | POA: Diagnosis not present

## 2017-10-25 DIAGNOSIS — R2689 Other abnormalities of gait and mobility: Secondary | ICD-10-CM | POA: Diagnosis not present

## 2017-10-25 DIAGNOSIS — I872 Venous insufficiency (chronic) (peripheral): Secondary | ICD-10-CM | POA: Diagnosis not present

## 2017-10-25 DIAGNOSIS — I5031 Acute diastolic (congestive) heart failure: Secondary | ICD-10-CM | POA: Diagnosis not present

## 2017-10-25 DIAGNOSIS — J449 Chronic obstructive pulmonary disease, unspecified: Secondary | ICD-10-CM | POA: Diagnosis not present

## 2017-10-25 DIAGNOSIS — L97821 Non-pressure chronic ulcer of other part of left lower leg limited to breakdown of skin: Secondary | ICD-10-CM | POA: Diagnosis not present

## 2017-10-25 DIAGNOSIS — J9620 Acute and chronic respiratory failure, unspecified whether with hypoxia or hypercapnia: Secondary | ICD-10-CM | POA: Diagnosis not present

## 2017-10-25 DIAGNOSIS — I4891 Unspecified atrial fibrillation: Secondary | ICD-10-CM | POA: Diagnosis not present

## 2017-10-25 DIAGNOSIS — J9611 Chronic respiratory failure with hypoxia: Secondary | ICD-10-CM | POA: Diagnosis not present

## 2017-10-25 DIAGNOSIS — I5032 Chronic diastolic (congestive) heart failure: Secondary | ICD-10-CM | POA: Diagnosis not present

## 2017-10-25 DIAGNOSIS — I2510 Atherosclerotic heart disease of native coronary artery without angina pectoris: Secondary | ICD-10-CM | POA: Diagnosis not present

## 2017-10-25 DIAGNOSIS — E119 Type 2 diabetes mellitus without complications: Secondary | ICD-10-CM | POA: Diagnosis not present

## 2017-10-25 DIAGNOSIS — L8990 Pressure ulcer of unspecified site, unspecified stage: Secondary | ICD-10-CM | POA: Diagnosis not present

## 2017-10-25 DIAGNOSIS — I130 Hypertensive heart and chronic kidney disease with heart failure and stage 1 through stage 4 chronic kidney disease, or unspecified chronic kidney disease: Secondary | ICD-10-CM | POA: Diagnosis not present

## 2017-10-25 DIAGNOSIS — I509 Heart failure, unspecified: Secondary | ICD-10-CM | POA: Diagnosis not present

## 2017-10-25 DIAGNOSIS — Z0000 Encounter for general adult medical examination without abnormal findings: Secondary | ICD-10-CM | POA: Diagnosis not present

## 2017-10-26 ENCOUNTER — Encounter: Payer: Self-pay | Admitting: Internal Medicine

## 2017-10-26 ENCOUNTER — Telehealth: Payer: Self-pay | Admitting: Internal Medicine

## 2017-10-26 DIAGNOSIS — E1122 Type 2 diabetes mellitus with diabetic chronic kidney disease: Secondary | ICD-10-CM | POA: Diagnosis not present

## 2017-10-26 DIAGNOSIS — I4891 Unspecified atrial fibrillation: Secondary | ICD-10-CM | POA: Diagnosis not present

## 2017-10-26 DIAGNOSIS — N183 Chronic kidney disease, stage 3 (moderate): Secondary | ICD-10-CM | POA: Diagnosis not present

## 2017-10-26 DIAGNOSIS — L97821 Non-pressure chronic ulcer of other part of left lower leg limited to breakdown of skin: Secondary | ICD-10-CM | POA: Diagnosis not present

## 2017-10-26 DIAGNOSIS — I130 Hypertensive heart and chronic kidney disease with heart failure and stage 1 through stage 4 chronic kidney disease, or unspecified chronic kidney disease: Secondary | ICD-10-CM | POA: Diagnosis not present

## 2017-10-26 DIAGNOSIS — I5032 Chronic diastolic (congestive) heart failure: Secondary | ICD-10-CM | POA: Diagnosis not present

## 2017-10-26 DIAGNOSIS — I872 Venous insufficiency (chronic) (peripheral): Secondary | ICD-10-CM | POA: Diagnosis not present

## 2017-10-26 DIAGNOSIS — J449 Chronic obstructive pulmonary disease, unspecified: Secondary | ICD-10-CM | POA: Diagnosis not present

## 2017-10-26 DIAGNOSIS — L89322 Pressure ulcer of left buttock, stage 2: Secondary | ICD-10-CM | POA: Diagnosis not present

## 2017-10-26 NOTE — Telephone Encounter (Signed)
She stated it was not pink pills it was pee pills, she has her furosemide and we verified her dose, she is good.

## 2017-10-26 NOTE — Telephone Encounter (Signed)
Patient is out of medicine and wondering if could that a these pink pills she received from the hosp, pls call

## 2017-10-27 ENCOUNTER — Other Ambulatory Visit: Payer: Self-pay

## 2017-10-27 ENCOUNTER — Ambulatory Visit: Payer: Self-pay

## 2017-10-27 NOTE — Patient Outreach (Signed)
[facility]) Care Management  10/27/2017  Jamie Mason May 24, 1941 [id number]  Unsuccessful outreach attempt to [facility] with [facility]. BSW left HIPAA compliant voice message requesting a return call in regards to outcome of ramp referral.  [name], BSW, CDP Social Worker Cell # [phone] [name]

## 2017-10-30 ENCOUNTER — Other Ambulatory Visit: Payer: Self-pay | Admitting: *Deleted

## 2017-10-30 ENCOUNTER — Other Ambulatory Visit: Payer: Self-pay

## 2017-10-30 NOTE — Patient Outreach (Signed)
[facility]) Care Management  10/30/2017  Jamie Mason [birth year] [id number]  BSW received a return call from [facility] with [facility]). Lynda informed this BSW that as stated before, [facility] is unable to build patient a permanent ramp due to patients active bankruptcy status. Stark Bray also informed this BSW that at this time [facility] does not have any materials available to provide patient with a temporary ramp. BSW to contact patient this week to discuss status of referral.  [name], BSW, CDP Social Worker Cell # [phone] [name]

## 2017-10-30 NOTE — Patient Outreach (Signed)
[facility] [facility] Care Management  10/30/2017  Jamie Mason 26-May-1941 [id number]   Call received from member stating that she has been having pain in her feet over the weekend that has caused a decrease in her mobility.  She still has not been able to walk or weigh herself but does report completion of home visit by MD.  Jamie Mason the plan is for the MD to visit at least once a month going forward until she is able to get out of the home and back to her primary MD.  Other than foot pain, she denies any other concerns.  Advised to contact the MD from [place] to report foot pain and request something for relief.  Also advised to discuss regime for ongoing foot care.  Member advised that Jamie Mason from [place] has been trying to reach her without success.  Advised to call to schedule home assessment/evaluation.  This care manager will follow up next week.  [facility] CM Care Plan Problem One     Most Recent Value  Care Plan Problem One  Risk for readmission related to heart failure management as evidenced by recent hospitalization  Role Documenting the Problem One  Care Management [place] for Problem One  Active  [facility] Long Term Goal   Patient will not have any admission for Congestive Heart Failure within the next 31 days  [facility] Long Term Goal Start Date  09/25/17  [facility] Long Term Goal Met Date  10/30/17  [facility] CM Short Term Goal #1   Member will report contact with [facility] within the next 2 weeks  [facility] CM Short Term Goal #1 Start Date  10/30/17  Interventions for Short Term Goal #1  Provided member with contact information for Jamie Mason at [place] Short Term Goal #2   Member will report completion of visit with MD within the next week  [facility] CM Short Term Goal #2 Start Date  10/24/17  [facility] CM Short Term Goal #2 Met Date  10/30/17      Valente David, RN, MSN [place] [phone]

## 2017-10-31 ENCOUNTER — Other Ambulatory Visit: Payer: Self-pay

## 2017-10-31 NOTE — Patient Outreach (Signed)
[facility]) Care Management  10/31/2017  Jamie Mason 1940/07/16 [id number]   BSW contacted patient to discuss options in obtaining a wheelchair ramp. HIPAA identifiers verified. BSW discussed with patient that due to active bankruptcy status [employer] is unable to assist patient in building a ramp. Patient confirms she filed for bankruptcy "last year". BSW let patient know that other resources have been contacted and this BSW is awaiting return calls.  BSW has placed calls to [name] with vocational rehab, [name], and [facility] to inquire if patient would qualify for resources given her bankruptcy status. BSW left messages requesting a return call from each possible resource.   BSW praised patient for utilizing [employer] as suggested by [employer]. Patient stated they came to her home yesterday and will plan to visit patient monthly. BSW asked patient if she was able to speak with [name] in regards to [facility] referral. Patient stated she has not been in contact with [name]. BSW encouraged patient to contact [facility] as referred by RN CM, [name]. Patient agreed to call.  Plan: BSW to continue follow-up with community resources in hopes of obtaining patient a wheelchair ramp.  [name], BSW, CDP Social Worker Cell # [phone] [name]

## 2017-11-01 ENCOUNTER — Telehealth: Payer: Self-pay

## 2017-11-01 NOTE — Telephone Encounter (Signed)
VO for [facility] PT to continue, do you agree?

## 2017-11-01 NOTE — Telephone Encounter (Signed)
Jamie Mason with [facility] requesting VO for PT. Please call back.  

## 2017-11-02 ENCOUNTER — Emergency Department (HOSPITAL_COMMUNITY): Payer: Medicare HMO

## 2017-11-02 ENCOUNTER — Inpatient Hospital Stay (HOSPITAL_COMMUNITY)
Admission: EM | Admit: 2017-11-02 | Discharge: 2017-11-14 | DRG: 207 | Disposition: A | Discharge status: Hospice | Payer: Medicare HMO | Attending: Internal Medicine | Admitting: Internal Medicine

## 2017-11-02 ENCOUNTER — Encounter (HOSPITAL_COMMUNITY): Payer: Self-pay

## 2017-11-02 ENCOUNTER — Other Ambulatory Visit: Payer: Self-pay

## 2017-11-02 DIAGNOSIS — Z823 Family history of stroke: Secondary | ICD-10-CM

## 2017-11-02 DIAGNOSIS — Z7401 Bed confinement status: Secondary | ICD-10-CM | POA: Diagnosis not present

## 2017-11-02 DIAGNOSIS — I2722 Pulmonary hypertension due to left heart disease: Secondary | ICD-10-CM | POA: Diagnosis present

## 2017-11-02 DIAGNOSIS — J810 Acute pulmonary edema: Secondary | ICD-10-CM | POA: Diagnosis not present

## 2017-11-02 DIAGNOSIS — R0682 Tachypnea, not elsewhere classified: Secondary | ICD-10-CM | POA: Diagnosis not present

## 2017-11-02 DIAGNOSIS — Z888 Allergy status to other drugs, medicaments and biological substances status: Secondary | ICD-10-CM

## 2017-11-02 DIAGNOSIS — G9340 Encephalopathy, unspecified: Secondary | ICD-10-CM

## 2017-11-02 DIAGNOSIS — Z4682 Encounter for fitting and adjustment of non-vascular catheter: Secondary | ICD-10-CM | POA: Diagnosis not present

## 2017-11-02 DIAGNOSIS — I2510 Atherosclerotic heart disease of native coronary artery without angina pectoris: Secondary | ICD-10-CM | POA: Diagnosis present

## 2017-11-02 DIAGNOSIS — M79606 Pain in leg, unspecified: Secondary | ICD-10-CM | POA: Diagnosis not present

## 2017-11-02 DIAGNOSIS — I5033 Acute on chronic diastolic (congestive) heart failure: Secondary | ICD-10-CM | POA: Diagnosis present

## 2017-11-02 DIAGNOSIS — Z608 Other problems related to social environment: Secondary | ICD-10-CM | POA: Diagnosis not present

## 2017-11-02 DIAGNOSIS — J189 Pneumonia, unspecified organism: Secondary | ICD-10-CM | POA: Diagnosis not present

## 2017-11-02 DIAGNOSIS — Z515 Encounter for palliative care: Secondary | ICD-10-CM | POA: Diagnosis not present

## 2017-11-02 DIAGNOSIS — D631 Anemia in chronic kidney disease: Secondary | ICD-10-CM | POA: Diagnosis present

## 2017-11-02 DIAGNOSIS — Y95 Nosocomial condition: Secondary | ICD-10-CM | POA: Diagnosis present

## 2017-11-02 DIAGNOSIS — J9690 Respiratory failure, unspecified, unspecified whether with hypoxia or hypercapnia: Secondary | ICD-10-CM | POA: Diagnosis not present

## 2017-11-02 DIAGNOSIS — E785 Hyperlipidemia, unspecified: Secondary | ICD-10-CM | POA: Diagnosis present

## 2017-11-02 DIAGNOSIS — E872 Acidosis: Secondary | ICD-10-CM | POA: Diagnosis present

## 2017-11-02 DIAGNOSIS — N183 Chronic kidney disease, stage 3 (moderate): Secondary | ICD-10-CM | POA: Diagnosis present

## 2017-11-02 DIAGNOSIS — J9600 Acute respiratory failure, unspecified whether with hypoxia or hypercapnia: Secondary | ICD-10-CM | POA: Diagnosis present

## 2017-11-02 DIAGNOSIS — I4892 Unspecified atrial flutter: Secondary | ICD-10-CM | POA: Diagnosis present

## 2017-11-02 DIAGNOSIS — Z7189 Other specified counseling: Secondary | ICD-10-CM | POA: Diagnosis not present

## 2017-11-02 DIAGNOSIS — Z809 Family history of malignant neoplasm, unspecified: Secondary | ICD-10-CM

## 2017-11-02 DIAGNOSIS — J9601 Acute respiratory failure with hypoxia: Secondary | ICD-10-CM | POA: Diagnosis not present

## 2017-11-02 DIAGNOSIS — J9602 Acute respiratory failure with hypercapnia: Secondary | ICD-10-CM

## 2017-11-02 DIAGNOSIS — J9622 Acute and chronic respiratory failure with hypercapnia: Secondary | ICD-10-CM | POA: Diagnosis present

## 2017-11-02 DIAGNOSIS — E1142 Type 2 diabetes mellitus with diabetic polyneuropathy: Secondary | ICD-10-CM | POA: Diagnosis not present

## 2017-11-02 DIAGNOSIS — G9341 Metabolic encephalopathy: Secondary | ICD-10-CM | POA: Diagnosis present

## 2017-11-02 DIAGNOSIS — Z87891 Personal history of nicotine dependence: Secondary | ICD-10-CM

## 2017-11-02 DIAGNOSIS — E876 Hypokalemia: Secondary | ICD-10-CM | POA: Diagnosis present

## 2017-11-02 DIAGNOSIS — M2550 Pain in unspecified joint: Secondary | ICD-10-CM | POA: Diagnosis not present

## 2017-11-02 DIAGNOSIS — Z66 Do not resuscitate: Secondary | ICD-10-CM | POA: Diagnosis not present

## 2017-11-02 DIAGNOSIS — Z885 Allergy status to narcotic agent status: Secondary | ICD-10-CM

## 2017-11-02 DIAGNOSIS — J811 Chronic pulmonary edema: Secondary | ICD-10-CM

## 2017-11-02 DIAGNOSIS — J9621 Acute and chronic respiratory failure with hypoxia: Principal | ICD-10-CM | POA: Diagnosis present

## 2017-11-02 DIAGNOSIS — Z91018 Allergy to other foods: Secondary | ICD-10-CM

## 2017-11-02 DIAGNOSIS — Z6841 Body Mass Index (BMI) 40.0 and over, adult: Secondary | ICD-10-CM

## 2017-11-02 DIAGNOSIS — Z9981 Dependence on supplemental oxygen: Secondary | ICD-10-CM | POA: Diagnosis not present

## 2017-11-02 DIAGNOSIS — Z7984 Long term (current) use of oral hypoglycemic drugs: Secondary | ICD-10-CM

## 2017-11-02 DIAGNOSIS — Z0189 Encounter for other specified special examinations: Secondary | ICD-10-CM

## 2017-11-02 DIAGNOSIS — I130 Hypertensive heart and chronic kidney disease with heart failure and stage 1 through stage 4 chronic kidney disease, or unspecified chronic kidney disease: Secondary | ICD-10-CM | POA: Diagnosis not present

## 2017-11-02 DIAGNOSIS — Z7901 Long term (current) use of anticoagulants: Secondary | ICD-10-CM

## 2017-11-02 DIAGNOSIS — R7881 Bacteremia: Secondary | ICD-10-CM | POA: Diagnosis not present

## 2017-11-02 DIAGNOSIS — R918 Other nonspecific abnormal finding of lung field: Secondary | ICD-10-CM | POA: Diagnosis not present

## 2017-11-02 DIAGNOSIS — I5031 Acute diastolic (congestive) heart failure: Secondary | ICD-10-CM | POA: Diagnosis not present

## 2017-11-02 DIAGNOSIS — E1122 Type 2 diabetes mellitus with diabetic chronic kidney disease: Secondary | ICD-10-CM | POA: Diagnosis present

## 2017-11-02 DIAGNOSIS — N179 Acute kidney failure, unspecified: Secondary | ICD-10-CM | POA: Diagnosis not present

## 2017-11-02 DIAGNOSIS — I252 Old myocardial infarction: Secondary | ICD-10-CM

## 2017-11-02 DIAGNOSIS — I4891 Unspecified atrial fibrillation: Secondary | ICD-10-CM | POA: Diagnosis present

## 2017-11-02 DIAGNOSIS — R0602 Shortness of breath: Secondary | ICD-10-CM | POA: Diagnosis not present

## 2017-11-02 DIAGNOSIS — Z880 Allergy status to penicillin: Secondary | ICD-10-CM

## 2017-11-02 DIAGNOSIS — G4730 Sleep apnea, unspecified: Secondary | ICD-10-CM | POA: Diagnosis present

## 2017-11-02 DIAGNOSIS — M519 Unspecified thoracic, thoracolumbar and lumbosacral intervertebral disc disorder: Secondary | ICD-10-CM | POA: Diagnosis present

## 2017-11-02 DIAGNOSIS — Z659 Problem related to unspecified psychosocial circumstances: Secondary | ICD-10-CM

## 2017-11-02 LAB — CBC WITH DIFFERENTIAL/PLATELET
Basophils Absolute: 0 10*3/uL (ref 0.0–0.1)
Basophils Relative: 0 %
EOS PCT: 3 %
Eosinophils Absolute: 0.4 10*3/uL (ref 0.0–0.7)
HEMATOCRIT: 29.1 % — AB (ref 36.0–46.0)
Hemoglobin: 9.2 g/dL — ABNORMAL LOW (ref 12.0–15.0)
LYMPHS ABS: 1.9 10*3/uL (ref 0.7–4.0)
LYMPHS PCT: 15 %
MCH: 26.4 pg (ref 26.0–34.0)
MCHC: 31.6 g/dL (ref 30.0–36.0)
MCV: 83.6 fL (ref 78.0–100.0)
MONO ABS: 0.5 10*3/uL (ref 0.1–1.0)
Monocytes Relative: 4 %
NEUTROS ABS: 10.1 10*3/uL — AB (ref 1.7–7.7)
Neutrophils Relative %: 78 %
PLATELETS: 446 10*3/uL — AB (ref 150–400)
RBC: 3.48 MIL/uL — ABNORMAL LOW (ref 3.87–5.11)
RDW: 18.7 % — ABNORMAL HIGH (ref 11.5–15.5)
WBC: 12.9 10*3/uL — ABNORMAL HIGH (ref 4.0–10.5)

## 2017-11-02 LAB — BLOOD GAS, ARTERIAL
ACID-BASE DEFICIT: 1.1 mmol/L (ref 0.0–2.0)
Acid-Base Excess: 0.3 mmol/L (ref 0.0–2.0)
Bicarbonate: 25.8 mmol/L (ref 20.0–28.0)
Bicarbonate: 27 mmol/L (ref 20.0–28.0)
DELIVERY SYSTEMS: POSITIVE
DRAWN BY: 331471
DRAWN BY: 103701
Expiratory PAP: 6
FIO2: 50
FIO2: 100
Inspiratory PAP: 14
MECHVT: 470 mL
O2 Saturation: 80.5 %
O2 Saturation: 90.8 %
PATIENT TEMPERATURE: 98.6
PCO2 ART: 59.5 mmHg — AB (ref 32.0–48.0)
PCO2 ART: 59.6 mmHg — AB (ref 32.0–48.0)
PEEP: 5 cmH2O
PH ART: 7.279 — AB (ref 7.350–7.450)
PO2 ART: 73.5 mmHg — AB (ref 83.0–108.0)
Patient temperature: 98.6
RATE: 16 resp/min
pH, Arterial: 7.26 — ABNORMAL LOW (ref 7.350–7.450)
pO2, Arterial: 56.5 mmHg — ABNORMAL LOW (ref 83.0–108.0)

## 2017-11-02 LAB — BASIC METABOLIC PANEL
ANION GAP: 14 (ref 5–15)
BUN: 17 mg/dL (ref 6–20)
CHLORIDE: 102 mmol/L (ref 101–111)
CO2: 25 mmol/L (ref 22–32)
Calcium: 8.7 mg/dL — ABNORMAL LOW (ref 8.9–10.3)
Creatinine, Ser: 1.8 mg/dL — ABNORMAL HIGH (ref 0.44–1.00)
GFR calc Af Amer: 31 mL/min — ABNORMAL LOW (ref >60)
GFR calc non Af Amer: 26 mL/min — ABNORMAL LOW (ref >60)
GLUCOSE: 181 mg/dL — AB (ref 65–99)
POTASSIUM: 3.4 mmol/L — AB (ref 3.5–5.1)
Sodium: 141 mmol/L (ref 135–145)

## 2017-11-02 LAB — MRSA PCR SCREENING: MRSA BY PCR: NEGATIVE

## 2017-11-02 LAB — GLUCOSE, CAPILLARY
GLUCOSE-CAPILLARY: 171 mg/dL — AB (ref 65–99)
GLUCOSE-CAPILLARY: 127 mg/dL — AB (ref 65–99)

## 2017-11-02 LAB — I-STAT TROPONIN, ED: Troponin i, poc: 0 ng/mL (ref 0.00–0.08)

## 2017-11-02 LAB — PHOSPHORUS: PHOSPHORUS: 3.9 mg/dL (ref 2.5–4.6)

## 2017-11-02 LAB — MAGNESIUM: Magnesium: 1.7 mg/dL (ref 1.7–2.4)

## 2017-11-02 LAB — BRAIN NATRIURETIC PEPTIDE: B Natriuretic Peptide: 946.8 pg/mL — ABNORMAL HIGH (ref 0.0–100.0)

## 2017-11-02 LAB — TRIGLYCERIDES: TRIGLYCERIDES: 61 mg/dL (ref <150)

## 2017-11-02 MED ORDER — SODIUM CHLORIDE 0.9 % IV SOLN
2.0000 g | Freq: Once | INTRAVENOUS | Status: DC
  Administered 2017-11-02: 2 g via INTRAVENOUS
  Filled 2017-11-02: qty 2

## 2017-11-02 MED ORDER — CHLORHEXIDINE GLUCONATE 0.12% ORAL RINSE (MEDLINE KIT): 15.0000 mL | Freq: Two times a day (BID) | OROMUCOSAL | Status: DC

## 2017-11-02 MED ORDER — BISACODYL 10 MG RE SUPP: 10.0000 mg | Freq: Every day | RECTAL | Status: DC | PRN

## 2017-11-02 MED ORDER — VANCOMYCIN HCL IN DEXTROSE 1-5 GM/200ML-% IV SOLN
1000.0000 mg | Freq: Once | INTRAVENOUS | Status: AC
  Administered 2017-11-02: 1000 mg via INTRAVENOUS
  Filled 2017-11-02: qty 200

## 2017-11-02 MED ORDER — ETOMIDATE 2 MG/ML IV SOLN
INTRAVENOUS | Status: AC | PRN
  Administered 2017-11-02: 40 mg via INTRAVENOUS

## 2017-11-02 MED ORDER — ENOXAPARIN SODIUM 60 MG/0.6ML ~~LOC~~ SOLN
60.0000 mg | SUBCUTANEOUS | Status: DC
  Filled 2017-11-02: qty 0.6

## 2017-11-02 MED ORDER — SUCCINYLCHOLINE CHLORIDE 20 MG/ML IJ SOLN
INTRAMUSCULAR | Status: AC | PRN
  Administered 2017-11-02: 120 mg via INTRAVENOUS

## 2017-11-02 MED ORDER — APIXABAN 5 MG PO TABS
5.0000 mg | ORAL_TABLET | Freq: Two times a day (BID) | ORAL | Status: DC
  Filled 2017-11-02: qty 1

## 2017-11-02 MED ORDER — SODIUM CHLORIDE 0.9 % IV SOLN
INTRAVENOUS | Status: DC
  Administered 2017-11-02: 22:00:00 via INTRAVENOUS

## 2017-11-02 MED ORDER — VITAL HIGH PROTEIN PO LIQD
1000.0000 mL | ORAL | Status: DC
  Administered 2017-11-02 – 2017-11-07 (×7): 1000 mL

## 2017-11-02 MED ORDER — PROPOFOL 1000 MG/100ML IV EMUL
0.0000 ug/kg/min | INTRAVENOUS | Status: DC
  Administered 2017-11-03: 12 ug/kg/min via INTRAVENOUS
  Administered 2017-11-03 (×2): 10 ug/kg/min via INTRAVENOUS
  Administered 2017-11-04: 5 ug/kg/min via INTRAVENOUS
  Administered 2017-11-04: 8 ug/kg/min via INTRAVENOUS
  Administered 2017-11-05: 6 ug/kg/min via INTRAVENOUS
  Administered 2017-11-06: 4 ug/kg/min via INTRAVENOUS
  Administered 2017-11-07: 5 ug/kg/min via INTRAVENOUS
  Filled 2017-11-02 (×9): qty 100

## 2017-11-02 MED ORDER — FENTANYL 2500MCG IN NS 250ML (10MCG/ML) PREMIX INFUSION
0.0000 ug/h | INTRAVENOUS | Status: DC
  Administered 2017-11-02: 25 ug/h via INTRAVENOUS
  Filled 2017-11-02: qty 250

## 2017-11-02 MED ORDER — AMIODARONE HCL 200 MG PO TABS
200.0000 mg | ORAL_TABLET | Freq: Every day | ORAL | Status: DC
  Administered 2017-11-03: 200 mg
  Filled 2017-11-02 (×2): qty 1

## 2017-11-02 MED ORDER — INSULIN ASPART 100 UNIT/ML ~~LOC~~ SOLN
0.0000 [IU] | SUBCUTANEOUS | Status: DC
  Administered 2017-11-02: 4 [IU] via SUBCUTANEOUS
  Administered 2017-11-02 – 2017-11-04 (×8): 3 [IU] via SUBCUTANEOUS
  Administered 2017-11-05: 4 [IU] via SUBCUTANEOUS
  Administered 2017-11-05 – 2017-11-08 (×10): 3 [IU] via SUBCUTANEOUS
  Administered 2017-11-08: 4 [IU] via SUBCUTANEOUS
  Administered 2017-11-08 (×4): 3 [IU] via SUBCUTANEOUS
  Administered 2017-11-09 (×2): 4 [IU] via SUBCUTANEOUS

## 2017-11-02 MED ORDER — TRAMADOL HCL 50 MG PO TABS: 50.0000 mg | ORAL_TABLET | Freq: Four times a day (QID) | ORAL | 0 refills | Status: DC | PRN

## 2017-11-02 MED ORDER — FUROSEMIDE 10 MG/ML IJ SOLN
40.0000 mg | Freq: Two times a day (BID) | INTRAMUSCULAR | Status: AC
  Administered 2017-11-02 – 2017-11-03 (×2): 40 mg via INTRAVENOUS
  Filled 2017-11-02 (×2): qty 4

## 2017-11-02 MED ORDER — SODIUM CHLORIDE 0.9 % IV SOLN
1.0000 g | Freq: Two times a day (BID) | INTRAVENOUS | Status: DC
  Administered 2017-11-03 – 2017-11-07 (×9): 1 g via INTRAVENOUS
  Filled 2017-11-02 (×10): qty 1

## 2017-11-02 MED ORDER — TRAMADOL HCL 50 MG PO TABS
50.0000 mg | ORAL_TABLET | Freq: Once | ORAL | Status: AC
  Administered 2017-11-02: 50 mg via ORAL
  Filled 2017-11-02: qty 1

## 2017-11-02 MED ORDER — ALBUTEROL SULFATE (2.5 MG/3ML) 0.083% IN NEBU
2.5000 mg | INHALATION_SOLUTION | RESPIRATORY_TRACT | Status: DC | PRN
Start: 2017-11-02 — End: 2017-11-14

## 2017-11-02 MED ORDER — FENTANYL BOLUS VIA INFUSION
25.0000 ug | INTRAVENOUS | Status: DC | PRN
  Administered 2017-11-02 – 2017-11-07 (×11): 25 ug via INTRAVENOUS
  Filled 2017-11-02: qty 25

## 2017-11-02 MED ORDER — DOCUSATE SODIUM 50 MG/5ML PO LIQD: 100.0000 mg | Freq: Two times a day (BID) | ORAL | Status: DC | PRN

## 2017-11-02 MED ORDER — FENTANYL 2500MCG IN NS 250ML (10MCG/ML) PREMIX INFUSION
25.0000 ug/h | INTRAVENOUS | Status: DC
  Administered 2017-11-02: 50 ug/h via INTRAVENOUS
  Administered 2017-11-03: 100 ug/h via INTRAVENOUS
  Administered 2017-11-04: 125 ug/h via INTRAVENOUS
  Administered 2017-11-04: 200 ug/h via INTRAVENOUS
  Administered 2017-11-05: 75 ug/h via INTRAVENOUS
  Administered 2017-11-06 (×3): 300 ug/h via INTRAVENOUS
  Administered 2017-11-07: 175 ug/h via INTRAVENOUS
  Administered 2017-11-07: 150 ug/h via INTRAVENOUS
  Administered 2017-11-08: 250 ug/h via INTRAVENOUS
  Filled 2017-11-02 (×12): qty 250

## 2017-11-02 MED ORDER — ATORVASTATIN CALCIUM 40 MG PO TABS: 40.0000 mg | ORAL_TABLET | Freq: Every day | ORAL | Status: DC

## 2017-11-02 MED ORDER — ALBUTEROL SULFATE (2.5 MG/3ML) 0.083% IN NEBU
5.0000 mg | INHALATION_SOLUTION | Freq: Once | RESPIRATORY_TRACT | Status: AC
  Administered 2017-11-02: 5 mg via RESPIRATORY_TRACT
  Filled 2017-11-02: qty 6

## 2017-11-02 MED ORDER — ORAL CARE MOUTH RINSE
15.0000 mL | Freq: Four times a day (QID) | OROMUCOSAL | Status: DC
  Administered 2017-11-03 – 2017-11-14 (×24): 15 mL via OROMUCOSAL

## 2017-11-02 MED ORDER — ORAL CARE MOUTH RINSE
15.0000 mL | OROMUCOSAL | Status: DC
  Administered 2017-11-02 – 2017-11-06 (×37): 15 mL via OROMUCOSAL

## 2017-11-02 MED ORDER — VANCOMYCIN HCL 10 G IV SOLR
1500.0000 mg | Freq: Once | INTRAVENOUS | Status: AC
  Administered 2017-11-02: 1500 mg via INTRAVENOUS
  Filled 2017-11-02: qty 1500

## 2017-11-02 MED ORDER — POTASSIUM CHLORIDE 20 MEQ/15ML (10%) PO SOLN
40.0000 meq | Freq: Four times a day (QID) | ORAL | Status: AC
  Administered 2017-11-02: 40 meq
  Filled 2017-11-02: qty 30

## 2017-11-02 MED ORDER — FUROSEMIDE 10 MG/ML IJ SOLN
80.0000 mg | Freq: Once | INTRAMUSCULAR | Status: AC
  Administered 2017-11-02: 80 mg via INTRAVENOUS
  Filled 2017-11-02: qty 8

## 2017-11-02 MED ORDER — MIDAZOLAM HCL 2 MG/2ML IJ SOLN
1.0000 mg | INTRAMUSCULAR | Status: DC | PRN
  Administered 2017-11-02 – 2017-11-06 (×10): 1 mg via INTRAVENOUS
  Filled 2017-11-02 (×12): qty 2

## 2017-11-02 MED ORDER — ATORVASTATIN CALCIUM 40 MG PO TABS
40.0000 mg | ORAL_TABLET | Freq: Every day | ORAL | Status: DC
  Administered 2017-11-03 – 2017-11-08 (×6): 40 mg
  Filled 2017-11-02 (×6): qty 1

## 2017-11-02 MED ORDER — ASPIRIN EC 81 MG PO TBEC
81.0000 mg | DELAYED_RELEASE_TABLET | Freq: Every day | ORAL | Status: DC
  Administered 2017-11-03: 81 mg via ORAL
  Filled 2017-11-02: qty 1

## 2017-11-02 MED ORDER — SODIUM CHLORIDE 0.9 % IV SOLN
2.0000 g | Freq: Once | INTRAVENOUS | Status: AC
  Administered 2017-11-02: 2 g via INTRAVENOUS
  Filled 2017-11-02: qty 2

## 2017-11-02 MED ORDER — VANCOMYCIN HCL 10 G IV SOLR: 1500.0000 mg | INTRAVENOUS | Status: DC

## 2017-11-02 MED ORDER — FENTANYL CITRATE (PF) 100 MCG/2ML IJ SOLN
50.0000 ug | Freq: Once | INTRAMUSCULAR | Status: AC
  Administered 2017-11-02: 50 ug via INTRAVENOUS
  Filled 2017-11-02: qty 2

## 2017-11-02 MED ORDER — AMIODARONE HCL 200 MG PO TABS: 200.0000 mg | ORAL_TABLET | Freq: Every day | ORAL | Status: DC

## 2017-11-02 MED ORDER — APIXABAN 5 MG PO TABS
5.0000 mg | ORAL_TABLET | Freq: Two times a day (BID) | ORAL | Status: DC
  Administered 2017-11-02 – 2017-11-07 (×11): 5 mg
  Filled 2017-11-02 (×13): qty 1

## 2017-11-02 MED ORDER — PANTOPRAZOLE SODIUM 40 MG PO PACK
40.0000 mg | PACK | ORAL | Status: DC
  Administered 2017-11-02 – 2017-11-07 (×6): 40 mg
  Filled 2017-11-02 (×7): qty 20

## 2017-11-02 MED ORDER — CARVEDILOL 12.5 MG PO TABS
12.5000 mg | ORAL_TABLET | Freq: Two times a day (BID) | ORAL | Status: DC
  Administered 2017-11-03: 12.5 mg via ORAL
  Filled 2017-11-02: qty 1

## 2017-11-02 MED ORDER — PRO-STAT SUGAR FREE PO LIQD
30.0000 mL | Freq: Two times a day (BID) | ORAL | Status: DC
  Administered 2017-11-02 – 2017-11-08 (×12): 30 mL
  Filled 2017-11-02 (×14): qty 30

## 2017-11-02 MED ORDER — CHLORHEXIDINE GLUCONATE 0.12% ORAL RINSE (MEDLINE KIT)
15.0000 mL | Freq: Two times a day (BID) | OROMUCOSAL | Status: DC
  Administered 2017-11-02 – 2017-11-13 (×22): 15 mL via OROMUCOSAL

## 2017-11-02 NOTE — ED Notes (Signed)
ED TO INPATIENT HANDOFF REPORT  Name/Age/Gender Jamie Mason 77 y.o. female  Code Status    Code Status Orders  (From admission, onward)        Start     Ordered   11/02/17 1804  Full code  Continuous     11/02/17 1803    Code Status History    Date Active Date Inactive Code Status Order ID Comments User Context   09/15/2017 0007 09/23/2017 0505 Partial Code [id number]  Lorella Nimrod, MD ED   01/19/2017 2131 01/25/2017 1801 Full Code [id number]  Germain Osgood, PA-C ED   01/19/2017 1808 01/19/2017 2131 Full Code [id number]  Vashti Hey, MD ED   10/27/2016 1508 11/01/2016 1955 Full Code [id number]  Collier Salina, MD ED   09/12/2016 0858 09/14/2016 2213 Full Code [id number]  Florinda Marker, MD ED   07/06/2016 0344 07/16/2016 2213 Full Code [id number]  Florinda Marker, MD ED   06/26/2016 2126 06/30/2016 2308 Full Code [id number]  Reubin Milan, MD Inpatient   08/18/2014 2252 08/19/2014 1927 Full Code [id number]  Juluis Mire, MD ED   05/02/2011 2303 05/04/2011 1902 Full Code [id number]  Margart Sickles, RN Inpatient      Home/SNF/Other Home  Chief Complaint Leg Pain  Level of Care/Admitting Diagnosis ED Disposition    ED Disposition Condition [place]: [facility] [100102]  Level of Care: ICU [6]  Diagnosis: Acute respiratory failure ([place]) [893.73.ICD-9-CM]  Admitting Physician: Chesley Mires [[id number]]  Attending Physician: Chesley Mires [[id number]]  Estimated length of stay: 3 - 4 days  Certification:: I certify this patient will need inpatient services for at least 2 midnights  PT Class (Do Not Modify): Inpatient [101]  PT Acc Code (Do Not Modify): Private [1]       Medical History Past Medical History:  Diagnosis Date  . Atrial flutter with rapid ventricular response ([place]) 07/11/2016  . CAD (coronary artery disease) 05/04/2011  . Diabetes mellitus without complication ([place])   . Diastolic heart failure ([place]) 06/2016   EF  60-65%, G2DD  . Hyperlipidemia   . Hypertension   . Lumbar disc disease   . Morbid Obesity 05/02/2011  . Non-ST elevation myocardial infarction (NSTEMI), initial episode of care ([place]) 05/02/2011    Allergies Allergies  Allergen Reactions  . Lisinopril Swelling    Tongue swelling   . Ace Inhibitors Other (See Comments)    unknown  . Codeine Itching and Rash  . Penicillins Itching and Rash    Has patient had a PCN reaction causing immediate rash, facial/tongue/throat swelling, SOB or lightheadedness with hypotension: unknown Has patient had a PCN reaction causing severe rash involving mucus membranes or skin necrosis: unknown Has patient had a PCN reaction that required hospitalization: no Has patient had a PCN reaction occurring within the last 10 years: no If all of the above answers are "NO", then may proceed with Cephalosporin use. TOLERATES CEPHALOSPORINS  . Tomato     Itchy rash    IV Location/Drains/Wounds Patient Lines/Drains/Airways Status   Active Line/Drains/Airways    Name:   Placement date:   Placement time:   Site:   Days:   Peripheral IV 11/02/17 Left Antecubital   11/02/17    1753    Antecubital   less than 1   Peripheral IV 11/02/17 Right Antecubital   11/02/17    1728    Antecubital   less than 1   NG/OG Tube Orogastric  14 Fr. Right mouth Xray   11/02/17    1803    Right mouth   less than 1   Urethral Catheter Vida Rigger NT+3 Temperature probe 14 Fr.   11/02/17    1821    Temperature probe   less than 1   Airway 7.5 mm   11/02/17    1734     less than 1   Pressure Injury 07/06/16 Stage III -  Full thickness tissue loss. Subcutaneous fat may be visible but bone, tendon or muscle are NOT exposed. redness   07/06/16    0630     484   Pressure Injury 07/06/16 Stage II -  Partial thickness loss of dermis presenting as a shallow open ulcer with a red, pink wound bed without slough.   07/06/16    0630     484   Pressure Injury 07/08/16 Stage III -  Full thickness  tissue loss. Subcutaneous fat may be visible but bone, tendon or muscle are NOT exposed.   07/08/16    2030     482   Pressure Injury 01/20/17 Stage II -  Partial thickness loss of dermis presenting as a shallow open ulcer with a red, pink wound bed without slough. red pink, open    01/20/17    0042     286   Wound / Incision (Open or Dehisced) 07/07/16 Buttocks Right;Left   07/07/16    1256    Buttocks   483   Wound / Incision (Open or Dehisced) 01/19/17 Venous stasis ulcer Leg Left;Lower boggy red with maggots (aug 2018), Dry crusted skin surronding red wound bed ( april 2019)   01/19/17    1839    Leg   287   Wound / Incision (Open or Dehisced) 09/15/17 Buttocks Left stage III   09/15/17    1527    Buttocks   48   Wound / Incision (Open or Dehisced) 09/15/17 Arm Lower;Right;Posterior Wound is red/pink/    09/15/17    1532    Arm   48          Labs/Imaging Results for orders placed or performed during the hospital encounter of 11/02/17 (from the past 48 hour(s))  Basic metabolic panel     Status: Abnormal   Collection Time: 11/02/17  3:26 PM  Result Value Ref Range   Sodium 141 135 - 145 mmol/L   Potassium 3.4 (L) 3.5 - 5.1 mmol/L   Chloride 102 101 - 111 mmol/L   CO2 25 22 - 32 mmol/L   Glucose, Bld 181 (H) 65 - 99 mg/dL   BUN 17 6 - 20 mg/dL   Creatinine, Ser 1.80 (H) 0.44 - 1.00 mg/dL   Calcium 8.7 (L) 8.9 - 10.3 mg/dL   GFR calc non Af Amer 26 (L) >60 mL/min   GFR calc Af Amer 31 (L) >60 mL/min    Comment: (NOTE) The eGFR has been calculated using the CKD EPI equation. This calculation has not been validated in all clinical situations. eGFR's persistently <60 mL/min signify possible Chronic Kidney Disease.    Anion gap 14 5 - 15    Comment: Performed at [facility], [place] [address] [place], [place] [address]  CBC with Differential     Status: Abnormal   Collection Time: 11/02/17  3:26 PM  Result Value Ref Range   WBC 12.9 (H) 4.0 - 10.5 K/uL   RBC 3.48  (L) 3.87 - 5.11 MIL/uL  Hemoglobin 9.2 (L) 12.0 - 15.0 g/dL   HCT 29.1 (L) 36.0 - 46.0 %   MCV 83.6 78.0 - 100.0 fL   MCH 26.4 26.0 - 34.0 pg   MCHC 31.6 30.0 - 36.0 g/dL   RDW 18.7 (H) 11.5 - 15.5 %   Platelets 446 (H) 150 - 400 K/uL   Neutrophils Relative % 78 %   Neutro Abs 10.1 (H) 1.7 - 7.7 K/uL   Lymphocytes Relative 15 %   Lymphs Abs 1.9 0.7 - 4.0 K/uL   Monocytes Relative 4 %   Monocytes Absolute 0.5 0.1 - 1.0 K/uL   Eosinophils Relative 3 %   Eosinophils Absolute 0.4 0.0 - 0.7 K/uL   Basophils Relative 0 %   Basophils Absolute 0.0 0.0 - 0.1 K/uL    Comment: Performed at [facility], [place] [address] [place], [place] [address]  Brain natriuretic peptide     Status: Abnormal   Collection Time: 11/02/17  3:26 PM  Result Value Ref Range   B Natriuretic Peptide 946.8 (H) 0.0 - 100.0 pg/mL    Comment: Performed at [facility], [place] [address] [place], [place] [address]  I-stat troponin, ED     Status: None   Collection Time: 11/02/17  3:57 PM  Result Value Ref Range   Troponin i, poc 0.00 0.00 - 0.08 ng/mL   Comment 3            Comment: Due to the release kinetics of cTnI, a negative result within the first hours of the onset of symptoms does not rule out myocardial infarction with certainty. If myocardial infarction is still suspected, repeat the test at appropriate intervals.   Blood gas, arterial     Status: Abnormal   Collection Time: 11/02/17  4:55 PM  Result Value Ref Range   FIO2 50.00    Delivery systems BILEVEL POSITIVE AIRWAY PRESSURE    Inspiratory PAP 14    Expiratory PAP 6    pH, Arterial 7.260 (L) 7.350 - 7.450   pCO2 arterial 59.5 (H) 32.0 - 48.0 mmHg   pO2, Arterial 56.5 (L) 83.0 - 108.0 mmHg   Bicarbonate 25.8 20.0 - 28.0 mmol/L   Acid-base deficit 1.1 0.0 - 2.0 mmol/L   O2 Saturation 80.5 %   Patient temperature 98.6    Collection site RIGHT RADIAL    Drawn by [id number]    Sample type ARTERIAL DRAW     Allens test (pass/fail) PASS PASS    Comment: Performed at [facility], [place] [address], [place], [place] [address]  Draw ABG 1 hour after initiation of ventilator     Status: Abnormal   Collection Time: 11/02/17  6:30 PM  Result Value Ref Range   FIO2 100.00    Delivery systems VENTILATOR    Mode PRESSURE REGULATED VOLUME CONTROL    VT 470 mL   LHR 16 resp/min   Peep/cpap 5.0 cm H20   pH, Arterial 7.279 (L) 7.350 - 7.450   pCO2 arterial 59.6 (H) 32.0 - 48.0 mmHg   pO2, Arterial 73.5 (L) 83.0 - 108.0 mmHg   Bicarbonate 27.0 20.0 - 28.0 mmol/L   Acid-Base Excess 0.3 0.0 - 2.0 mmol/L   O2 Saturation 90.8 %   Patient temperature 98.6    Collection site RIGHT RADIAL    Drawn by [id number]    Sample type ARTERIAL    Allens test (pass/fail) PASS PASS    Comment: Performed at [facility]  [facility] [place] [address] [place], [place] [address]   Dg Chest Portable 1 View  Result Date: 11/02/2017 CLINICAL DATA:  Respiratory failure, intubated. EXAM: PORTABLE CHEST 1 VIEW COMPARISON:  11/02/2017 FINDINGS: Stable cardiomegaly with aortic atherosclerosis. New endotracheal tube terminates 3.2 cm above the carina in satisfactory position. A gastric tube extends below the left hemidiaphragm. The tip and side port are excluded on this study. Diffuse airspace opacities consistent with pulmonary edema are redemonstrated slightly more confluent in the left upper and right lower lobes. Probable small bilateral pleural effusions. No pneumothorax. No acute osseous abnormality. IMPRESSION: 1. Satisfactory endotracheal tube position approximately 3.2 cm above the carina. 2. Gastric tube extends below the left hemidiaphragm. The tip and side port are excluded on this study. 3. Stable cardiomegaly with diffuse bilateral airspace disease consistent with pulmonary edema. Superimposed pneumonia would be difficult to entirely exclude bilaterally. Electronically Signed   By: Ashley Royalty M.D.   On:  11/02/2017 18:36   Dg Chest Port 1 View  Result Date: 11/02/2017 CLINICAL DATA:  77 year old with shortness of breath. EXAM: PORTABLE CHEST 1 VIEW COMPARISON:  09/17/2017 FINDINGS: Portable view of the chest demonstrates stable cardiomegaly. The trachea is midline. Atherosclerotic calcifications at the aortic arch. Hazy densities in both lungs with more prominent opacities in the lower lungs, right side greater than left. Negative for pneumothorax. No acute bone abnormality. IMPRESSION: Bilateral hazy lung densities are concerning for airspace disease and pulmonary edema. More confluent disease in the lower lungs, particularly on the right side. An infectious or inflammatory process cannot be excluded. Electronically Signed   By: Markus Daft M.D.   On: 11/02/2017 15:29    Pending Labs Unresulted Labs (From admission, onward)   Start     Ordered   11/03/17 0500  Blood gas, arterial  Tomorrow morning,   R     11/02/17 1806   11/03/17 0500  Comprehensive metabolic panel  Tomorrow morning,   R     11/02/17 1806   11/03/17 0500  CBC  Tomorrow morning,   R     11/02/17 1806   11/02/17 1809  Magnesium  (ICU Tube Feeding: PEPuP )  5A & 5P,   R     11/02/17 1808   11/02/17 1809  Phosphorus  (ICU Tube Feeding: PEPuP )  5A & 5P,   R     11/02/17 1808   11/02/17 1806  Culture, respiratory (NON-Expectorated)  Once,   R     11/02/17 1806   11/02/17 1805  Triglycerides  (propofol (DIPRIVAN))  Every 72 hours,   R    Comments:  While on propofol (DIPRIVAN)    11/02/17 1804   11/02/17 1715  Blood Culture (routine x 2)  BLOOD CULTURE X 2,   STAT     11/02/17 1714      Vitals/Pain Today's Vitals   11/02/17 1745 11/02/17 1800 11/02/17 1815 11/02/17 1845  BP:  (!) 151/92 (!) 167/149 (!) 129/54  Pulse: 77 69 80 77  Resp: (!) 23 16 (!) 22 (!) 21  Temp:      TempSrc:      SpO2: 96% 98% 96% 96%  Weight:      Height:      PainSc:        Isolation Precautions No active  isolations  Medications Medications  vancomycin (VANCOCIN) IVPB 1000 mg/200 mL premix (1,000 mg Intravenous New Bag/Given 11/02/17 1827)  vancomycin (VANCOCIN) 1,500 mg in sodium chloride 0.9 % 500 mL  IVPB (has no administration in time range)  ceFEPIme (MAXIPIME) 2 g in sodium chloride 0.9 % 100 mL IVPB (has no administration in time range)  fentaNYL 25100mg in NS 2579m(1052mml) infusion-PREMIX (50 mcg/hr Intravenous New Bag/Given 11/02/17 1827)  fentaNYL (SUBLIMAZE) bolus via infusion 25 mcg (has no administration in time range)  propofol (DIPRIVAN) 1000 MG/100ML infusion (16.532 mcg/kg/min  136.1 kg Intravenous Rate/Dose Change 11/02/17 1840)  midazolam (VERSED) injection 1 mg (has no administration in time range)  docusate (COLACE) 50 MG/5ML liquid 100 mg (has no administration in time range)  bisacodyl (DULCOLAX) suppository 10 mg (has no administration in time range)  chlorhexidine gluconate (MEDLINE KIT) (PERIDEX) 0.12 % solution 15 mL (has no administration in time range)  MEDLINE mouth rinse (has no administration in time range)  pantoprazole sodium (PROTONIX) 40 mg/20 mL oral suspension 40 mg (has no administration in time range)  aspirin EC tablet 81 mg (has no administration in time range)  insulin aspart (novoLOG) injection 0-20 Units (has no administration in time range)  feeding supplement (VITAL HIGH PROTEIN) liquid 1,000 mL (has no administration in time range)  feeding supplement (PRO-STAT SUGAR FREE 64) liquid 30 mL (has no administration in time range)  amiodarone (PACERONE) tablet 200 mg (has no administration in time range)  atorvastatin (LIPITOR) tablet 40 mg (has no administration in time range)  carvedilol (COREG) tablet 12.5 mg (has no administration in time range)  furosemide (LASIX) injection 40 mg (has no administration in time range)  0.9 %  sodium chloride infusion (has no administration in time range)  albuterol (PROVENTIL) (2.5 MG/3ML) 0.083% nebulizer  solution 2.5 mg (has no administration in time range)  potassium chloride 20 MEQ/15ML (10%) solution 40 mEq (has no administration in time range)  ceFEPIme (MAXIPIME) 1 g in sodium chloride 0.9 % 100 mL IVPB (has no administration in time range)  apixaban (ELIQUIS) tablet 5 mg (has no administration in time range)  traMADol (ULTRAM) tablet 50 mg (50 mg Oral Given 11/02/17 0451)  albuterol (PROVENTIL) (2.5 MG/3ML) 0.083% nebulizer solution 5 mg (5 mg Nebulization Given 11/02/17 1425)  furosemide (LASIX) injection 80 mg (80 mg Intravenous Given 11/02/17 1554)  etomidate (AMIDATE) injection (40 mg Intravenous Given 11/02/17 1725)  fentaNYL (SUBLIMAZE) injection 50 mcg (50 mcg Intravenous Given 11/02/17 1754)  succinylcholine (ANECTINE) injection (120 mg Intravenous Given 11/02/17 1726)    Mobility walks with person assist at baseline

## 2017-11-02 NOTE — ED Provider Notes (Signed)
Hospitalist came to me at 445 stating that the patient did not appear to be stable for stepdown and felt that she needed ICU care.  On evaluation of the patient she has bilateral crackles and rhonchi throughout all lung fields she is tachypneic and struggling to breathe.  She will answer short questions by head nod but is unable to speak.  She is currently on 100% FiO2 of BiPAP with an oxygen saturation of 87%.  She does have evidence of diffuse edema throughout her body but on x-ray that was done earlier when she complained of shortness of breath showed pulmonary edema and questionable opacities.  Patient does have a white count of 12 and mild bump in her creatinine.  Concern for possible sepsis and pneumonia as well.  Patient had already received Lasix.  Blood pressure is been between 150-160s systolic and no nitro drip has been started.  Patient does not have history of COPD or chronic lung disease.  She does have a history of atrial fibrillation and CHF.  Patient is on Eliquis and lower suspicion for PE at this time.  Patient sepsis protocol was initiated.  She was started on broad-spectrum antibiotics.  She will be admitted to the ICU.  Intubation without complication.  X-ray confirmed tube placement.  CRITICAL CARE Performed by: Juandaniel Manfredo Total critical care time: 30 minutes Critical care time was exclusive of separately billable procedures and treating other patients. Critical care was necessary to treat or prevent imminent or life-threatening deterioration. Critical care was time spent personally by me on the following activities: development of treatment plan with patient and/or surrogate as well as nursing, discussions with consultants, evaluation of patient's response to treatment, examination of patient, obtaining history from patient or surrogate, ordering and performing treatments and interventions, ordering and review of laboratory studies, ordering and review of radiographic studies,  pulse oximetry and re-evaluation of patient's condition.  Procedure Name: Intubation Date/Time: 11/02/2017 5:34 PM Performed by: Gwyneth Sprout, MD Pre-anesthesia Checklist: Patient identified Oxygen Delivery Method: Nasal cannula Preoxygenation: Pre-oxygenation with 100% oxygen Induction Type: Rapid sequence Ventilation: Mask ventilation without difficulty Laryngoscope Size: Glidescope and 4 Grade View: Grade II Tube size: 7.5 mm Number of attempts: 1 Airway Equipment and Method: Patient positioned with wedge pillow Placement Confirmation: ETT inserted through vocal cords under direct vision,  Positive ETCO2,  CO2 detector and Breath sounds checked- equal and bilateral Secured at: 24 cm Tube secured with: ETT holder Dental Injury: Teeth and Oropharynx as per pre-operative assessment  Difficulty Due To: Difficulty was unanticipated Comments: Due to patient's hypoxia she was oxygenated with BiPAP and 15 L nasal cannula passive oxygenation during intubation.  There were no complications.          Gwyneth Sprout, MD 11/02/17 1740

## 2017-11-02 NOTE — ED Triage Notes (Signed)
Pt arrived from home via [facility] with complaints of pain in her lower extremities since Saturday night. States she tried taking tylenol at home with no relief.

## 2017-11-02 NOTE — ED Provider Notes (Signed)
[facility]-EMERGENCY DEPT Provider Note: Jamie Dell, MD, FACEP  CSN: [id number] MRN: [id number] ARRIVAL: 11/02/17 at 0110 ROOM: WA10/WA10   CHIEF COMPLAINT  Leg Pain (Bilateral)   HISTORY OF PRESENT ILLNESS  11/02/17 4:12 AM Jamie Mason is a 77 y.o. female with multiple medical problems.  She is nonambulatory at home due to chronic lower extremity edema and pain.  She experienced an exacerbation of pain in her feet and right lower leg 5 days ago.  She has been taking Tylenol without adequate relief.  Symptoms are moderate to severe, worse with palpation or movement.  She denies injury.  She denies acute change in her edema.  There is no associated erythema or warmth.  She has not had a fever.  She states she has had good relief with tramadol in the past.  She is on chronic home oxygen.   Past Medical History:  Diagnosis Date  . Atrial flutter with rapid ventricular response (HCC) 07/11/2016  . CAD (coronary artery disease) 05/04/2011  . Diabetes mellitus without complication (HCC)   . Diastolic heart failure (HCC) 06/2016   EF 60-65%, G2DD  . Hyperlipidemia   . Hypertension   . Lumbar disc disease   . Morbid Obesity 05/02/2011  . Non-ST elevation myocardial infarction (NSTEMI), initial episode of care [facility]) 05/02/2011    Past Surgical History:  Procedure Laterality Date  . CARDIAC CATHETERIZATION N/A 07/11/2016   Procedure: Left Heart Cath and Coronary Angiography;  Surgeon: [name], MD;  Location: [facility] INVASIVE CV LAB;  Service: Cardiovascular;  Laterality: N/A;  . CARDIOVERSION N/A 07/13/2016   Procedure: CARDIOVERSION;  Surgeon: Wendall Stade, MD;  Location: [facility] ENDOSCOPY;  Service: Cardiovascular;  Laterality: N/A;  . LEFT HEART CATHETERIZATION WITH CORONARY ANGIOGRAM N/A 05/03/2011   Procedure: LEFT HEART CATHETERIZATION WITH CORONARY ANGIOGRAM;  Surgeon: Othella Boyer, MD;  Location: [facility] CATH LAB;  Service: Cardiovascular;  Laterality: N/A;  . none      Family  History  Problem Relation Age of Onset  . Cancer Father   . Stroke Mother     Social History   Tobacco Use  . Smoking status: Former Smoker    Packs/day: 0.50    Years: 50.00    Pack years: 25.00    Types: Cigarettes    Last attempt to quit: 04/01/2011    Years since quitting: 6.5  . Smokeless tobacco: Never Used  Substance Use Topics  . Alcohol use: No  . Drug use: No    Prior to Admission medications   Medication Sig Start Date End Date Taking? Authorizing Provider  acetaminophen (TYLENOL) 500 MG tablet Take 1,000 mg by mouth as needed for mild pain.   Yes [provider]  albuterol (VENTOLIN HFA) 108 (90 Base) MCG/ACT inhaler INHALE 2 PUFFS INTO THE LUNGS EVERY 6 HOURS AS NEEDED FOR WHEEZING OR SHORTNESS OF BREATH. 10/11/17  Yes Burns Spain, MD  amiodarone (PACERONE) 200 MG tablet Take 1 tablet (200 mg total) by mouth daily. 12/22/16  Yes Burns Spain, MD  apixaban (ELIQUIS) 5 MG TABS tablet Take 1 tablet (5 mg total) by mouth 2 (two) times daily. 09/06/16  Yes Burns Spain, MD  aspirin (ASPIR-LOW) 81 MG EC tablet Take 81 mg by mouth daily. Swallow whole.   Yes [provider]  atorvastatin (LIPITOR) 40 MG tablet TAKE 1 TABLET EVERY DAY 10/02/17  Yes Burns Spain, MD  carvedilol (COREG) 12.5 MG tablet Take 1 tablet (12.5 mg total) by  mouth 2 (two) times daily with a meal. 09/22/17  Yes Lanelle Bal, MD  diltiazem (CARDIZEM CD) 240 MG 24 hr capsule Take 1 capsule (240 mg total) by mouth daily. 05/11/17 11/02/17 Yes Burns Spain, MD  furosemide (LASIX) 80 MG tablet Take 1 tablet (80 mg total) by mouth 2 (two) times daily. 09/22/17  Yes Lanelle Bal, MD  hydrALAZINE (APRESOLINE) 50 MG tablet Take 1 tablet (50 mg total) by mouth 3 (three) times daily. 12/29/16  Yes Rozann Lesches, MD  metFORMIN (GLUCOPHAGE) 500 MG tablet Take 1 tablet (500 mg total) by mouth 2 (two) times daily with a meal. 10/11/17  Yes Burns Spain, MD    Allergies Lisinopril; Ace inhibitors; Codeine; Penicillins; and Tomato   REVIEW OF SYSTEMS  Negative except as noted here or in the History of Present Illness.   PHYSICAL EXAMINATION  Initial Vital Signs Blood pressure (!) 157/49, pulse (!) 56, temperature 98.3 F (36.8 C), temperature source Oral, resp. rate 17, height 5' 5.5" (1.664 m), weight 136.1 kg (300 lb), SpO2 98 %.  Examination General: Well-developed, obese female in no acute distress; appearance consistent with age of record HENT: normocephalic; atraumatic Eyes: pupils equal, round and reactive to light; extraocular muscles intact Neck: supple Heart: regular rate and rhythm Lungs: clear to auscultation bilaterally Abdomen: soft; nontender; obese; bowel sounds present Extremities: No deformity; chronic appearing edema and skin changes of lower legs without erythema or warmth; pulses normal; tenderness and hyperesthesia of bilateral feet and right lower leg Neurologic: Awake, alert and oriented; motor function intact in all extremities and symmetric; no facial droop Skin: Warm and dry Psychiatric: Normal mood and affect   RESULTS  Summary of this visit's results, reviewed by myself:   EKG Interpretation  Date/Time:    Ventricular Rate:    PR Interval:    QRS Duration:   QT Interval:    QTC Calculation:   R Axis:     Text Interpretation:        Laboratory Studies: No results found for this or any previous visit (from the past 24 hour(s)). Imaging Studies: No results found.  ED COURSE and MDM  Nursing notes and initial vitals signs, including pulse oximetry, reviewed.  Vitals:   11/02/17 0120 11/02/17 0130 11/02/17 0230 11/02/17 0330  BP:  (!) 122/43 (!) 145/51 (!) 157/49  Pulse:  (!) 52 (!) 50 (!) 56  Resp:    17  Temp:      TempSrc:      SpO2:  95% 99% 98%  Weight: 136.1 kg (300 lb)     Height: 5' 5.5" (1.664 m)      Suspect patient's pain is an exacerbation of her known  diabetic neuropathy.  She has chronic changes of her lower legs but no erythema or warmth to suggest acute cellulitis.  We will place her on tramadol have her follow-up with her primary care physician.  Due to her poor home conditions we will consult social work for assessment later this morning.  Consultation with the [facility] state controlled substances database reveals the patient has received 1 prescription for tramadol in the past 2 years.   PROCEDURES    ED DIAGNOSES     ICD-10-CM   1. Diabetic polyneuropathy associated with type 2 diabetes mellitus (HCC) E11.42   2. Poor social situation Z65.9        Duru Reiger, Jonny Ruiz, MD 11/02/17 [phone]

## 2017-11-02 NOTE — Telephone Encounter (Signed)
VO HH PT OK with me

## 2017-11-02 NOTE — ED Provider Notes (Addendum)
Patient was seen by Child psychotherapist and the patient does not have any needs per the Child psychotherapist.  She has a home health nurse coming out.  She was discharged with rx/instructions per Dr. Read Drivers    As patient was being discharged, she started to become SOB.  Was slightly wheezy on exam, given albuterol neb, continued to worsen.  CXR, labs ordered, CXR shows pulmonary edema vs infectious process.  Pt's breathing worse with accessory muscle use.  She was started on BiPAP.  Lasix ordered.  I feel her symptoms are more consistent with pulmonary edema versus pneumonia.  She does not report any cough or shortness of breath at home.  She does have chronic lower extremity edema and she cannot really tell me whether it is worse today than at baseline.  ABG pending.  Pt is doing better on Bipap.  I spoke with the hospitalist to admit the patient.  She is a patient of the internal medicine teaching service I do not feel that she stable for transfer to [facility] at this point.  CRITICAL CARE Performed by: Rolan Bucco Total critical care time: 60 minutes Critical care time was exclusive of separately billable procedures and treating other patients. Critical care was necessary to treat or prevent imminent or life-threatening deterioration. Critical care was time spent personally by me on the following activities: development of treatment plan with patient and/or surrogate as well as nursing, discussions with consultants, evaluation of patient's response to treatment, examination of patient, obtaining history from patient or surrogate, ordering and performing treatments and interventions, ordering and review of laboratory studies, ordering and review of radiographic studies, pulse oximetry and re-evaluation of patient's condition.  Rolan Bucco, MD 11/02/17 1140    Rolan Bucco, MD 11/02/17 [phone]

## 2017-11-02 NOTE — ED Notes (Signed)
Family now at bedside with patient. Dr. Anitra Lauth called family into conference room to explain plan of care.

## 2017-11-02 NOTE — ED Notes (Signed)
Writer entered pt room and fnd pt sitting dressed at the edge of bed stating that she cannot breath and does not want to be discharged " I feel like I have fluid around my heart and lungs", pt reports that the onset was new.  Pt was able to speak and full sentence. Dr. Fredderick Phenix made aware. pt had 2 lpm [place] of oxygen in place and O2 saturations 100% . [facility] arrived an was told pt was not  being discharged at the time and would call them upon discharge status.

## 2017-11-02 NOTE — ED Notes (Signed)
Unable to get 2nd set of blood cultures, difficult stick and nurse was unable to pull back off her line

## 2017-11-02 NOTE — Patient Outreach (Signed)
[facility]) Care Management  11/02/2017  Jamie Mason April 05, 1941 [id number]  BSW received a return call from [name] with vocational rehab indicating filing bankruptcy does not effect patients ability to participate in program. BSW provided Mrs. [name] with patients contact information. Mrs. [name] plans to contact patient to evaluate eligibility of vocational rehab's ability to assist patient with a ramp. BSW attempted to contact patient who was unavailable due to being at the hospital. BSW spoke with patients husband updating on recent referral. BSW to follow-up with patient in the next two weeks regarding ramp referral.  [name], BSW, CDP Social Worker Cell # [phone] [name]

## 2017-11-02 NOTE — ED Notes (Signed)
[facility] has been called regarding patient transport.

## 2017-11-02 NOTE — Progress Notes (Signed)
A consult was received from an ED physician for vancomycin and aztreonam per pharmacy dosing.  The patient's profile has been reviewed for ht/wt/allergies/indication/available labs.   She has tolerated cephalosporins in the past and d/w EDP to change aztreonam to cefepime  A one time order has been placed for cefepime 2 gm x 1 vancomycin 1 gm plus 1.5 gm for total loading dose = 2.5 gms vancomycin.    Further antibiotics/pharmacy consults should be ordered by admitting physician if indicated.                       Thank you, Herby Abraham, Pharm.D. [phone] 11/02/2017 5:40 PM

## 2017-11-02 NOTE — H&P (Signed)
PULMONARY / CRITICAL CARE MEDICINE   Name: Jamie Mason MRN: [id number] DOB: 12/30/1940    ADMISSION DATE:  11/02/2017  REFERRING MD:  Dr. Anitra Lauth  CHIEF COMPLAINT:  Short of breath  HISTORY OF PRESENT ILLNESS:   Hx from chart and ER staff.  77 yo female former smoker presented to ER with c/o leg pains for 5 days.  She was to be discharged from ER after meeting with social worker.  In ER she became short of breath and started on Bipap.  She was noted to have wheezing and CXR showed pulmonary edema.  She was given neb tx and lasix.  She become more lethargic.  ABG showed respiratory acidosis.  There was concern for pneumonia also.  She was intubated in ER and started on ABx.  PAST MEDICAL HISTORY :  She  has a past medical history of Atrial flutter with rapid ventricular response (HCC) (07/11/2016), CAD (coronary artery disease) (05/04/2011), Diabetes mellitus without complication (HCC), Diastolic heart failure (HCC) (19/1478), Hyperlipidemia, Hypertension, Lumbar disc disease, Morbid Obesity (05/02/2011), and Non-ST elevation myocardial infarction (NSTEMI), initial episode of care [facility]) (05/02/2011).  PAST SURGICAL HISTORY: She  has a past surgical history that includes none; left heart catheterization with coronary angiogram (N/A, 05/03/2011); Cardiac catheterization (N/A, 07/11/2016); and Cardioversion (N/A, 07/13/2016).  Allergies  Allergen Reactions  . Lisinopril Swelling    Tongue swelling   . Ace Inhibitors Other (See Comments)    unknown  . Codeine Itching and Rash  . Penicillins Itching and Rash    Has patient had a PCN reaction causing immediate rash, facial/tongue/throat swelling, SOB or lightheadedness with hypotension: unknown Has patient had a PCN reaction causing severe rash involving mucus membranes or skin necrosis: unknown Has patient had a PCN reaction that required hospitalization: no Has patient had a PCN reaction occurring within the last 10 years: no If all of  the above answers are "NO", then may proceed with Cephalosporin use. TOLERATES CEPHALOSPORINS  . Tomato     Itchy rash    No current facility-administered medications on file prior to encounter.    Current Outpatient Medications on File Prior to Encounter  Medication Sig  . acetaminophen (TYLENOL) 500 MG tablet Take 1,000 mg by mouth as needed for mild pain.  Marland Kitchen albuterol (VENTOLIN HFA) 108 (90 Base) MCG/ACT inhaler INHALE 2 PUFFS INTO THE LUNGS EVERY 6 HOURS AS NEEDED FOR WHEEZING OR SHORTNESS OF BREATH.  Marland Kitchen amiodarone (PACERONE) 200 MG tablet Take 1 tablet (200 mg total) by mouth daily.  Marland Kitchen apixaban (ELIQUIS) 5 MG TABS tablet Take 1 tablet (5 mg total) by mouth 2 (two) times daily.  Marland Kitchen aspirin (ASPIR-LOW) 81 MG EC tablet Take 81 mg by mouth daily. Swallow whole.  Marland Kitchen atorvastatin (LIPITOR) 40 MG tablet TAKE 1 TABLET EVERY DAY  . carvedilol (COREG) 12.5 MG tablet Take 1 tablet (12.5 mg total) by mouth 2 (two) times daily with a meal.  . diltiazem (CARDIZEM CD) 240 MG 24 hr capsule Take 1 capsule (240 mg total) by mouth daily.  . furosemide (LASIX) 80 MG tablet Take 1 tablet (80 mg total) by mouth 2 (two) times daily.  . hydrALAZINE (APRESOLINE) 50 MG tablet Take 1 tablet (50 mg total) by mouth 3 (three) times daily.  . metFORMIN (GLUCOPHAGE) 500 MG tablet Take 1 tablet (500 mg total) by mouth 2 (two) times daily with a meal.    FAMILY HISTORY:  Her indicated that her mother is deceased. She indicated that her father is  deceased. She indicated that two of her four sisters are alive. She indicated that only one of her two brothers is alive. She indicated that her maternal grandmother is deceased. She indicated that her maternal grandfather is deceased. She indicated that her paternal grandmother is deceased. She indicated that her paternal grandfather is deceased.   SOCIAL HISTORY: She  reports that she quit smoking about 6 years ago. Her smoking use included cigarettes. She has a 25.00  pack-year smoking history. She has never used smokeless tobacco. She reports that she does not drink alcohol or use drugs.  REVIEW OF SYSTEMS:   Unable to obtain.  SUBJECTIVE:   VITAL SIGNS: BP (!) 161/95   Pulse 83   Temp 98.3 F (36.8 C) (Oral)   Resp (!) 24   Ht 5' 5.5" (1.664 m)   Wt 300 lb (136.1 kg)   SpO2 (!) 87%   BMI 49.16 kg/m   VENTILATOR SETTINGS: Vent Mode: PRVC FiO2 (%):  [100 %] 100 % Set Rate:  [16 bmp] 16 bmp Vt Set:  [470 mL] 470 mL PEEP:  [5 cmH20] 5 cmH20 Plateau Pressure:  [29 cmH20] 29 cmH20  INTAKE / OUTPUT: No intake/output data recorded.  PHYSICAL EXAMINATION:  General - on vent Eyes - pupils reactive ENT - ETT in place Cardiac - irregular, no murmur Chest - b/l crackles Abd - soft, non tender Ext - 2+ non pitting edema Skin - chronic venous stasis changes Neuro - intermittently agitated, not following commands  LABS:  BMET Recent Labs  Lab 11/02/17 1526  NA 141  K 3.4*  CL 102  CO2 25  BUN 17  CREATININE 1.80*  GLUCOSE 181*    Electrolytes Recent Labs  Lab 11/02/17 1526  CALCIUM 8.7*    CBC Recent Labs  Lab 11/02/17 1526  WBC 12.9*  HGB 9.2*  HCT 29.1*  PLT 446*    Coag's No results for input(s): APTT, INR in the last 168 hours.  Sepsis Markers No results for input(s): LATICACIDVEN, PROCALCITON, O2SATVEN in the last 168 hours.  ABG Recent Labs  Lab 11/02/17 1655  PHART 7.260*  PCO2ART 59.5*  PO2ART 56.5*    Liver Enzymes No results for input(s): AST, ALT, ALKPHOS, BILITOT, ALBUMIN in the last 168 hours.  Cardiac Enzymes No results for input(s): TROPONINI, PROBNP in the last 168 hours.  Glucose No results for input(s): GLUCAP in the last 168 hours.  Imaging Dg Chest Port 1 View  Result Date: 11/02/2017 CLINICAL DATA:  77 year old with shortness of breath. EXAM: PORTABLE CHEST 1 VIEW COMPARISON:  09/17/2017 FINDINGS: Portable view of the chest demonstrates stable cardiomegaly. The trachea is  midline. Atherosclerotic calcifications at the aortic arch. Hazy densities in both lungs with more prominent opacities in the lower lungs, right side greater than left. Negative for pneumothorax. No acute bone abnormality. IMPRESSION: Bilateral hazy lung densities are concerning for airspace disease and pulmonary edema. More confluent disease in the lower lungs, particularly on the right side. An infectious or inflammatory process cannot be excluded. Electronically Signed   By: Richarda Overlie M.D.   On: 11/02/2017 15:29     STUDIES:  PFT 04/10/14 >> FEV1 1.50 (87%), FEV1% 77, TLC 4.54 (92%), DLCO 56% LHC 07/11/16 >> chronic occlusion mid RCA with collaterals, mod distal Lt MCA, mid and distal LAD, ostial Lt Cx, and OM2 Echo 09/17/17 >> mild LVH, EF 60 to 65%, grade 2 DD, mild LA dilation, mod RV dilation with systolic dysfx, PAS 51 mmHg  CULTURES: Blood 5/23 >> Sputum 5/23 >>  ANTIBIOTICS: Azactam 5/23 >> 5/23 Vancomycin 5/23 >>  Cefepime 5/23 >>   SIGNIFICANT EVENTS: 5/23 Admit  LINES/TUBES: ETT 5/23  DISCUSSION: 77 yo female with acute on chronic hypoxic/hypercapnic respiratory failure likely from acute pulmonary edema +/- pneumonia.  She has hx of CAD, A flutter on eliquis, HFpEF, HTN, DM.  ASSESSMENT / PLAN:  Acute on chronic hypoxic/hypercapnic respiratory failure from acute pulmonary edema +/- pneumonia. Probable sleep disordered breathing. - full vent support - f/u CXR, ABG - prn BDs  Acute pulmonary edema. Hx of CAD, HTN, A flutter, HFpEF. 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 - continue lasix - continue eliquis, coreg, ASA, amiodarone, lipitor - hold outpt cardizem, hydralazine  Possible PNA. - PCN allergy, but tolerates cephalosporins - vancomycin, zosyn  CKD 3. Hypokalemia. - monitor renal fx  Anemia of critical illness and chronic disease. - f/u CBC  DM type II. - SSI - hold outpt metformin  Acute metabolic encephalopathy. - RASS goal 0 to  -1  DVT prophylaxis - eliquis SUP - protonix Nutrition - tube feeds Goals of care - full code >> she was listed as DNI during last admission in April 2019 >> will need to clarify with family when available  CC time 42 minutes  Coralyn Helling, MD [facility] Pulmonary/Critical Care 11/02/2017, 6:30 PM

## 2017-11-02 NOTE — Progress Notes (Signed)
CSW received consult for "poor living conditions"- CSW met with patient via bedside and questioned if she had any concerns going on at home/ any needs for a social worker/ anything she would like to talk about. Patient reported no concerns however stated she thought she was getting a new pressure wound on her bottom. Patient stated she has a Therapist, sports that comes daily to help take care of any wounds and they "get along well". Patient was very pleasant and appreciated CSW. No further social work needs at this time- please re consult if needed.   Kingsley Spittle, [facility] Emergency Room Clinical Social Worker [phone]

## 2017-11-02 NOTE — ED Notes (Signed)
Respiratory has been contacted regarding patient oxygen saturation.

## 2017-11-02 NOTE — Progress Notes (Signed)
ANTICOAGULATION CONSULT NOTE - Initial Consult  Pharmacy Consult for apixaban Indication: atrial fibrillation  Allergies  Allergen Reactions  . Lisinopril Swelling    Tongue swelling   . Ace Inhibitors Other (See Comments)    unknown  . Codeine Itching and Rash  . Penicillins Itching and Rash    Has patient had a PCN reaction causing immediate rash, facial/tongue/throat swelling, SOB or lightheadedness with hypotension: unknown Has patient had a PCN reaction causing severe rash involving mucus membranes or skin necrosis: unknown Has patient had a PCN reaction that required hospitalization: no Has patient had a PCN reaction occurring within the last 10 years: no If all of the above answers are "NO", then may proceed with Cephalosporin use. TOLERATES CEPHALOSPORINS  . Tomato     Itchy rash    Patient Measurements: Height: 5' 5.5" (166.4 cm) Weight: 300 lb (136.1 kg) IBW/kg (Calculated) : 58.15   Vital Signs: BP: 167/149 (05/23 1815) Pulse Rate: 80 (05/23 1815)  Labs: Recent Labs    11/02/17 1526  HGB 9.2*  HCT 29.1*  PLT 446*  CREATININE 1.80*    Estimated Creatinine Clearance: 38.1 mL/min (A) (by C-G formula based on SCr of 1.8 mg/dL (H)).   Medical History: Past Medical History:  Diagnosis Date  . Atrial flutter with rapid ventricular response (HCC) 07/11/2016  . CAD (coronary artery disease) 05/04/2011  . Diabetes mellitus without complication (HCC)   . Diastolic heart failure (HCC) 06/2016   EF 60-65%, G2DD  . Hyperlipidemia   . Hypertension   . Lumbar disc disease   . Morbid Obesity 05/02/2011  . Non-ST elevation myocardial infarction (NSTEMI), initial episode of care [facility]) 05/02/2011    Assessment: 77 yo F on apixaban 5 mg PO bid PTA for afib. Only meets 1 criteria for lower dose (SCr > 1.5) so able to continue home dose.   Plan:  apixaban 5 mg per tube BID F/u renal function, CBC  Herby Abraham, Pharm.D. [phone] 11/02/2017 6:45 PM

## 2017-11-02 NOTE — ED Notes (Signed)
Patient incontinent of urine after IV initiation and BI PAP initiation. Patient cleansed, barrier cream applied, gown/bed and linen changed. Patient remains on bipap at this time. Patient to be admitted.

## 2017-11-02 NOTE — Progress Notes (Signed)
Pharmacy Antibiotic Note  Jamie Mason is a 77 y.o. female admitted on 11/02/2017 with pneumonia.  Pharmacy has been consulted for vancomycin and cefepime dosing.  Plan: Vancomycin 1000 mg + 1500 mg = 2500 mg loading dose followed by vancomycin 1500 mg IV q48 for est AUC 517 Cefepime 2 gm x 1 then cefepime 1 gm IV q12 F/u renal function, WBC, temp, culture data Vancomycin levels as needed  Height: 5' 5.5" (166.4 cm) Weight: 300 lb (136.1 kg) IBW/kg (Calculated) : 58.15  Temp (24hrs), Avg:98.3 F (36.8 C), Min:98.3 F (36.8 C), Max:98.3 F (36.8 C)  Recent Labs  Lab 11/02/17 1526  WBC 12.9*  CREATININE 1.80*    Estimated Creatinine Clearance: 38.1 mL/min (A) (by C-G formula based on SCr of 1.8 mg/dL (H)).    Allergies  Allergen Reactions  . Lisinopril Swelling    Tongue swelling   . Ace Inhibitors Other (See Comments)    unknown  . Codeine Itching and Rash  . Penicillins Itching and Rash    Has patient had a PCN reaction causing immediate rash, facial/tongue/throat swelling, SOB or lightheadedness with hypotension: unknown Has patient had a PCN reaction causing severe rash involving mucus membranes or skin necrosis: unknown Has patient had a PCN reaction that required hospitalization: no Has patient had a PCN reaction occurring within the last 10 years: no If all of the above answers are "NO", then may proceed with Cephalosporin use. TOLERATES CEPHALOSPORINS  . Tomato     Itchy rash   Antimicrobials this admission:  5/23 vanc >> 5/23 cefepime >> 5/23 aztreonam x 1 dose Dose adjustments this admission:   Microbiology results:  5/23 BCx2>> 5/23 resp cx ordered>>  Thank you for allowing pharmacy to be a part of this patient's care.  Herby Abraham, Pharm.D. [phone] 11/02/2017 6:53 PM

## 2017-11-02 NOTE — Progress Notes (Signed)
Pt transported to room 1239 from ED.   Pt remained on ventilator the entire time.  Pt transported without incident and remained stable throughout trip. Report given to ICU therapist.

## 2017-11-02 NOTE — ED Notes (Signed)
Bed: GN56 Expected date:  Expected time:  Means of arrival:  Comments: 77 yr old female bilateral extremity pain

## 2017-11-03 ENCOUNTER — Inpatient Hospital Stay (HOSPITAL_COMMUNITY): Payer: Medicare HMO

## 2017-11-03 LAB — GLUCOSE, CAPILLARY
GLUCOSE-CAPILLARY: 126 mg/dL — AB (ref 65–99)
Glucose-Capillary: 99 mg/dL (ref 65–99)
Glucose-Capillary: 94 mg/dL (ref 65–99)
Glucose-Capillary: 104 mg/dL — ABNORMAL HIGH (ref 65–99)
Glucose-Capillary: 136 mg/dL — ABNORMAL HIGH (ref 65–99)
Glucose-Capillary: 112 mg/dL — ABNORMAL HIGH (ref 65–99)

## 2017-11-03 LAB — BLOOD GAS, ARTERIAL
ACID-BASE EXCESS: 3.1 mmol/L — AB (ref 0.0–2.0)
ACID-BASE EXCESS: 2.4 mmol/L — AB (ref 0.0–2.0)
BICARBONATE: 30.5 mmol/L — AB (ref 20.0–28.0)
BICARBONATE: 28.9 mmol/L — AB (ref 20.0–28.0)
DRAWN BY: 232811
Drawn by: 103701
FIO2: 70
FIO2: 40
MECHVT: 470 mL
O2 SAT: 89.5 %
O2 Saturation: 97.5 %
PATIENT TEMPERATURE: 99.1
PEEP: 5 cmH2O
PEEP: 8 cmH2O
PH ART: 7.294 — AB (ref 7.350–7.450)
PO2 ART: 111 mmHg — AB (ref 83.0–108.0)
PO2 ART: 66.1 mmHg — AB (ref 83.0–108.0)
PRESSURE SUPPORT: 10 cmH2O
Patient temperature: 36.2
RATE: 16 resp/min
pCO2 arterial: 66.6 mmHg (ref 32.0–48.0)
pCO2 arterial: 61.8 mmHg — ABNORMAL HIGH (ref 32.0–48.0)
pH, Arterial: 7.277 — ABNORMAL LOW (ref 7.350–7.450)

## 2017-11-03 LAB — CBC
HCT: 30.6 % — ABNORMAL LOW (ref 36.0–46.0)
Hemoglobin: 9.4 g/dL — ABNORMAL LOW (ref 12.0–15.0)
MCH: 26.1 pg (ref 26.0–34.0)
MCHC: 30.7 g/dL (ref 30.0–36.0)
MCV: 85 fL (ref 78.0–100.0)
PLATELETS: 356 10*3/uL (ref 150–400)
RBC: 3.6 MIL/uL — AB (ref 3.87–5.11)
RDW: 19 % — ABNORMAL HIGH (ref 11.5–15.5)
WBC: 13.8 10*3/uL — ABNORMAL HIGH (ref 4.0–10.5)

## 2017-11-03 LAB — COMPREHENSIVE METABOLIC PANEL
ALBUMIN: 2.8 g/dL — AB (ref 3.5–5.0)
ALT: 10 U/L — AB (ref 14–54)
AST: 27 U/L (ref 15–41)
Alkaline Phosphatase: 76 U/L (ref 38–126)
Anion gap: 11 (ref 5–15)
BUN: 19 mg/dL (ref 6–20)
CHLORIDE: 103 mmol/L (ref 101–111)
CO2: 27 mmol/L (ref 22–32)
CREATININE: 1.8 mg/dL — AB (ref 0.44–1.00)
Calcium: 8.2 mg/dL — ABNORMAL LOW (ref 8.9–10.3)
GFR calc non Af Amer: 26 mL/min — ABNORMAL LOW (ref >60)
GFR, EST AFRICAN AMERICAN: 31 mL/min — AB (ref >60)
Glucose, Bld: 101 mg/dL — ABNORMAL HIGH (ref 65–99)
Potassium: 4.5 mmol/L (ref 3.5–5.1)
SODIUM: 141 mmol/L (ref 135–145)
Total Bilirubin: 0.6 mg/dL (ref 0.3–1.2)
Total Protein: 7.7 g/dL (ref 6.5–8.1)

## 2017-11-03 LAB — MAGNESIUM: Magnesium: 1.9 mg/dL (ref 1.7–2.4)

## 2017-11-03 LAB — PHOSPHORUS
Phosphorus: 4.6 mg/dL (ref 2.5–4.6)
Phosphorus: 4.1 mg/dL (ref 2.5–4.6)

## 2017-11-03 LAB — PROCALCITONIN: PROCALCITONIN: 0.82 ng/mL

## 2017-11-03 MED ORDER — DILTIAZEM 12 MG/ML ORAL SUSPENSION
40.0000 mg | Freq: Four times a day (QID) | ORAL | Status: DC
  Administered 2017-11-03: 40 mg
  Filled 2017-11-03 (×5): qty 6

## 2017-11-03 MED ORDER — ASPIRIN 81 MG PO CHEW
81.0000 mg | CHEWABLE_TABLET | Freq: Every day | ORAL | Status: DC
  Administered 2017-11-04 – 2017-11-08 (×5): 81 mg
  Filled 2017-11-03 (×5): qty 1

## 2017-11-03 MED ORDER — CARVEDILOL 12.5 MG PO TABS
12.5000 mg | ORAL_TABLET | Freq: Two times a day (BID) | ORAL | Status: DC
Start: 2017-11-03 — End: 2017-11-05
  Administered 2017-11-04: 12.5 mg
  Filled 2017-11-03 (×2): qty 1

## 2017-11-03 MED ORDER — DILTIAZEM 12 MG/ML ORAL SUSPENSION
40.0000 mg | Freq: Four times a day (QID) | ORAL | Status: DC
  Filled 2017-11-03: qty 6

## 2017-11-03 NOTE — Progress Notes (Addendum)
PULMONARY / CRITICAL CARE MEDICINE   Name: Jamie Mason MRN: [id number] DOB: [birth year]    ADMISSION DATE:  11/02/2017  REFERRING MD:  Dr. Anitra Lauth  CHIEF COMPLAINT:  Short of breath  HISTORY OF PRESENT ILLNESS:   Hx from chart and ER staff.  77 y/o female former smoker presented to ER 5/23 with c/o leg pains for 5 days (non-ambulatory due to chronic pain/swelling).  She was to be discharged from ER after meeting with social worker.  However, in ER she became short of breath and started on Bipap. She was noted to have wheezing and CXR showed pulmonary edema.  She was given neb tx and lasix.  She become more lethargic.  ABG showed respiratory acidosis.  There was concern for pneumonia also.  She was intubated in ER and started on ABx.   SUBJECTIVE:  RN reports pt remains on propofol / fentanyl gtt's.  Sedate.  Vent alarming frequently.    VITAL SIGNS: BP (!) 129/51 (BP Location: Right Arm)   Pulse (!) 58   Temp 98.8 F (37.1 C)   Resp 11   Ht 5' 5.5" (1.664 m)   Wt (!) 321 lb 14 oz (146 kg)   SpO2 100%   BMI 52.75 kg/m   VENTILATOR SETTINGS: Vent Mode: PRVC FiO2 (%):  [50 %-100 %] 70 % Set Rate:  [16 bmp] 16 bmp Vt Set:  [470 mL] 470 mL PEEP:  [5 cmH20] 5 cmH20 Plateau Pressure:  [24 cmH20-38 cmH20] 38 cmH20  INTAKE / OUTPUT: I/O last 3 completed shifts: In: 153.9 [I.V.:53.9; IV Piggyback:100] Out: 200 [Urine:200]  PHYSICAL EXAMINATION: General:  Obese elderly female in NAD on vent HEENT: MM pink/moist, ETT, pupils pinpoint Neuro: sedate, follows commands on WUA  CV: s1s2 rrr, no m/r/g PULM: even/non-labored, lungs bilaterally clear anterior, diminished lower  WU:JWJX, non-tender, bsx4 active  Extremities: warm/dry, BLE edema 1-2+, changes c/w chronic venous stasis   Skin: no rashes or lesions  LABS:  BMET Recent Labs  Lab 11/02/17 1526 11/03/17 0312  NA 141 141  K 3.4* 4.5  CL 102 103  CO2 25 27  BUN 17 19  CREATININE 1.80* 1.80*  GLUCOSE 181* 101*     Electrolytes Recent Labs  Lab 11/02/17 1526 11/02/17 2029 11/03/17 0312  CALCIUM 8.7*  --  8.2*  MG  --  1.7 1.9  PHOS  --  3.9 4.6    CBC Recent Labs  Lab 11/02/17 1526 11/03/17 0312  WBC 12.9* 13.8*  HGB 9.2* 9.4*  HCT 29.1* 30.6*  PLT 446* 356    Coag's No results for input(s): APTT, INR in the last 168 hours.  Sepsis Markers No results for input(s): LATICACIDVEN, PROCALCITON, O2SATVEN in the last 168 hours.  ABG Recent Labs  Lab 11/02/17 1655 11/02/17 1830 11/03/17 0340  PHART 7.260* 7.279* 7.277*  PCO2ART 59.5* 59.6* 66.6*  PO2ART 56.5* 73.5* 111*    Liver Enzymes Recent Labs  Lab 11/03/17 0312  AST 27  ALT 10*  ALKPHOS 76  BILITOT 0.6  ALBUMIN 2.8*    Cardiac Enzymes No results for input(s): TROPONINI, PROBNP in the last 168 hours.  Glucose Recent Labs  Lab 11/02/17 2032 11/02/17 2314 11/03/17 0329 11/03/17 0756  GLUCAP 171* 127* 99 94    Imaging Dg Chest Port 1 View  Result Date: 11/03/2017 CLINICAL DATA:  Respiratory distress EXAM: PORTABLE CHEST 1 VIEW COMPARISON:  11/02/2017 FINDINGS: Endotracheal tube and nasogastric catheter are again seen and stable. Mild cardiomegaly is noted  accentuated by the portable technique. Vascular congestion and edema are again identified and stable. No new focal abnormality is seen. No bony abnormality is noted. IMPRESSION: Stable vascular congestion and pulmonary edema. Electronically Signed   By: Alcide Clever M.D.   On: 11/03/2017 07:11   Dg Chest Portable 1 View  Result Date: 11/02/2017 CLINICAL DATA:  Respiratory failure, intubated. EXAM: PORTABLE CHEST 1 VIEW COMPARISON:  11/02/2017 FINDINGS: Stable cardiomegaly with aortic atherosclerosis. New endotracheal tube terminates 3.2 cm above the carina in satisfactory position. A gastric tube extends below the left hemidiaphragm. The tip and side port are excluded on this study. Diffuse airspace opacities consistent with pulmonary edema are  redemonstrated slightly more confluent in the left upper and right lower lobes. Probable small bilateral pleural effusions. No pneumothorax. No acute osseous abnormality. IMPRESSION: 1. Satisfactory endotracheal tube position approximately 3.2 cm above the carina. 2. Gastric tube extends below the left hemidiaphragm. The tip and side port are excluded on this study. 3. Stable cardiomegaly with diffuse bilateral airspace disease consistent with pulmonary edema. Superimposed pneumonia would be difficult to entirely exclude bilaterally. Electronically Signed   By: Tollie Eth M.D.   On: 11/02/2017 18:36   Dg Chest Port 1 View  Result Date: 11/02/2017 CLINICAL DATA:  77 year old with shortness of breath. EXAM: PORTABLE CHEST 1 VIEW COMPARISON:  09/17/2017 FINDINGS: Portable view of the chest demonstrates stable cardiomegaly. The trachea is midline. Atherosclerotic calcifications at the aortic arch. Hazy densities in both lungs with more prominent opacities in the lower lungs, right side greater than left. Negative for pneumothorax. No acute bone abnormality. IMPRESSION: Bilateral hazy lung densities are concerning for airspace disease and pulmonary edema. More confluent disease in the lower lungs, particularly on the right side. An infectious or inflammatory process cannot be excluded. Electronically Signed   By: Richarda Overlie M.D.   On: 11/02/2017 15:29     STUDIES:  PFT 04/10/14 >> FEV1 1.50 (87%), FEV1% 77, TLC 4.54 (92%), DLCO 56% LHC 07/11/16 >> chronic occlusion mid RCA with collaterals, mod distal Lt MCA, mid and distal LAD, ostial Lt Cx, and OM2 Echo 09/17/17 >> mild LVH, EF 60 to 65%, grade 2 DD, mild LA dilation, mod RV dilation with systolic dysfx, PAS 51 mmHg  CULTURES: Blood 5/23 >> Sputum 5/23 >>  ANTIBIOTICS: Azactam 5/23 >> 5/23 Vancomycin 5/23 >> 5/24 Cefepime 5/23 >>   SIGNIFICANT EVENTS: 5/23 Admit  LINES/TUBES: ETT 5/23  DISCUSSION: 77 yo female with acute on chronic  hypoxic/hypercapnic respiratory failure likely from acute pulmonary edema +/- pneumonia.  She has hx of CAD, A flutter on eliquis, HFpEF, HTN, DM.  ASSESSMENT / PLAN:  Acute on chronic hypoxic/hypercapnic respiratory failure from acute pulmonary edema +/- pneumonia.   Probable sleep disordered breathing. - PRVC 8cc/kg  - wean PEEP / FiO2 for sats > 90% - keep PEEP at 8 for chest wall compliance, this is not increased due to a clinical decline - follow CXR, ABG - SBT as able for comfort - PRN BD's   Acute pulmonary edema. Hx of CAD, HTN, A flutter, HFpEF. 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 - received lasix overnight, hold further am 5/24 and reassess in am  - continue eliquis, coreg, ASA, amiodarone, lipitor  - hold home hydralazine - resume cardizem   Possible PNA. - PCN allergy, tolerates cephalosporins  - continue zosyn, discontinue vancomycin - trend PCT to narrow abx  - follow cultures   CKD 3. Hypokalemia. - Trend BMP /  urinary output - Replace electrolytes as indicated - Avoid nephrotoxic agents, ensure adequate renal perfusion  Anemia of critical illness and chronic disease. - f/u CBC   DM type II. - SSI - hold home metformin   Acute metabolic encephalopathy. - RASS goal 0 to -1  - propofol for sedation, fentanyl for pain - PT efforts when able   DVT prophylaxis - eliquis + SCD's SUP - protonix Nutrition - tube feeds Goals of care - full code >> she was listed as DNI during last admission in April 2019 >> daughter confirms that her mother would not want CPR or prolonged interventions if she can not recover.  She would like to touch base with her father and brothers and let us know.    CC Time: 30 minutes  Canary Brim, NP-C [facility] Pgr: [phone] or if no answer [phone] 11/03/2017, 8:35 AM

## 2017-11-03 NOTE — Progress Notes (Signed)
Met with daughter Solmon Ice) to discuss plan of care.  Updated her in person & the patients husband/other daughter via phone.  We reviewed her current clinical status and that this event is hopefully a short term, reversible process.  They state she would not want CPR or prolonged mechanical vent support.  She was  DNI during last admission.  Family are in agreement for DNR but to continue current level of support for now.     Noe Gens, NP-C [facility] Pgr: [phone] or if no answer [phone] 11/03/2017, 10:59 AM

## 2017-11-03 NOTE — Progress Notes (Addendum)
Initial Nutrition Assessment  DOCUMENTATION CODES:   Morbid obesity  INTERVENTION:  Vital HP @ goal rate of 20 ml/hr (480 ml/24hr) with 8 Prostat. Total regimen with propofol providing 1539 kcal, 162 grams of protein, and 403 ml H2O; 106% kcal needs and 101% protein needs. Pt propofol rate planned to decrease.   NUTRITION DIAGNOSIS:   Inadequate oral intake related to inability to eat as evidenced by NPO status.  GOAL:   Provide needs based on ASPEN/SCCM guidelines  MONITOR:   Vent status, Labs, Weight trends, TF tolerance, Skin, I & O's  REASON FOR ASSESSMENT:   Ventilator, Consult Enteral/tube feeding initiation and management  ASSESSMENT:   77 y.o. Admitted on 11/02/17 for acute respiratory failure with respiratory acidosis with some concerns for pneumonia and/or sepsis. PMH of a fib., CAD, T2DM, diastolic HF, HLD, HTN, lumbar disc disease, NSTEMI. Pt former smoker (quit date 2012).   Pt asleep on vent with no family at bedside. Spoke with RN; completed wake up assessment and pt tried to get out of bed, pt put back on propofol, but will try to get to a lower rate.   Medications reviewed: eliquis, coreg, PS BID, vital HP 23ml/hr, novolog 0-20 units (not given am), protonix, maxipime, fentanyl, propofol, vancocin, versed.   Pt intubated on vent with orogastric tube. Dietetic intern observed pt on propofol and vital HP @ 40 ml.   Temp (24hrs), Avg:97.8 F (36.6 C), Min:97.2 F (36.2 C), Max:99.9 F (37.7 C)  MVe: 7.7 ml  Propofol @ 9.8 ml/hr = 259 kcal  Labs reviewed: BG 101 (H), creatinine 1.8 (H), albumin 2.8 (L), ALT 10 (L), GFR 26 (L), WBC 13.8 (H), RBC 3.6 (L), hemoglobin 9.4 (L), HCT 30.6 (L).   Net I/Os since admission: +1.1 L  NUTRITION - FOCUSED PHYSICAL EXAM:  Pt with no fat/muscle depletions, Mild edema.  Diet Order:   Diet Order           Diet NPO time specified  Diet effective now          EDUCATION NEEDS:   Not appropriate for education at  this time  Skin:  Skin Assessment: Reviewed RN Assessment  Last BM:  Unknown  Height:   Ht Readings from Last 1 Encounters:  11/02/17 5' 5.5" (1.664 m)    Weight:   Wt Readings from Last 1 Encounters:  11/03/17 (!) 321 lb 14 oz (146 kg)   UBW: 310-325 lbs - pt with CHF; could contribute to weight changes %UBW: 100%  Ideal Body Weight:  57.95 kg  BMI:  Body mass index is 52.75 kg/m.  Estimated Nutritional Needs:   Kcal:  1275-1450 kcal  Protein:  145-160 grams  Fluid:  >/= 1.3 L or per MD   Sherrine Maples, Dietetic Intern

## 2017-11-03 NOTE — Progress Notes (Signed)
5 cc of fentanyl wasted in sink from bag, witnessed by [employer], Charity fundraiser

## 2017-11-03 NOTE — Progress Notes (Signed)
ANTICOAGULATION CONSULT NOTE - Initial Consult  Pharmacy Consult for apixaban Indication: atrial fibrillation  Allergies  Allergen Reactions  . Lisinopril Swelling    Tongue swelling   . Ace Inhibitors Other (See Comments)    unknown  . Codeine Itching and Rash  . Penicillins Itching and Rash    Has patient had a PCN reaction causing immediate rash, facial/tongue/throat swelling, SOB or lightheadedness with hypotension: unknown Has patient had a PCN reaction causing severe rash involving mucus membranes or skin necrosis: unknown Has patient had a PCN reaction that required hospitalization: no Has patient had a PCN reaction occurring within the last 10 years: no If all of the above answers are "NO", then may proceed with Cephalosporin use. TOLERATES CEPHALOSPORINS  . Tomato     Itchy rash    Patient Measurements: Height: 5' 5.5" (166.4 cm) Weight: (!) 321 lb 14 oz (146 kg) IBW/kg (Calculated) : 58.15   Vital Signs: Temp: 99.1 F (37.3 C) (05/24 0900) Temp Source: Bladder (05/24 0900) BP: 134/71 (05/24 0900) Pulse Rate: 67 (05/24 0900)  Labs: Recent Labs    11/02/17 1526 11/03/17 0312  HGB 9.2* 9.4*  HCT 29.1* 30.6*  PLT 446* 356  CREATININE 1.80* 1.80*    Estimated Creatinine Clearance: 39.8 mL/min (A) (by C-G formula based on SCr of 1.8 mg/dL (H)).   Assessment: 77 yo F on apixaban 5 mg PO bid PTA for afib. Only meets 1 criteria for lower dose (SCr > 1.5) so able to continue home dose.   Today, 11/03/2017:  Hgb low but stable, Plt wnl  SCr unchanged; slightly above baseline ~1.5-1.7  Plan:   Continue apixaban 5 mg per tube BID  Pharmacy will sign off and follow peripherally for dose adjustments; can convert back to PO dosing once extubated  Bernadene Person, PharmD, BCPS [phone] 11/03/2017, 10:04 AM

## 2017-11-03 NOTE — Progress Notes (Signed)
Pharmacy Antibiotic Note  Jamie Mason is a 77 y.o. female admitted on 11/02/2017 with pneumonia.  Pharmacy has been consulted for vancomycin and cefepime dosing.  Today, 11/03/2017:  D2 abx  WBC up slightly  Remains afebrile  CCM thinking mostly CHF edema but will check PCT  Plan:  Vancomycin stopped with negative MRSA PCR; no Hx MRSA  Continue Cefepime 1g IV q12 hr; pharmacy will sign off dosing and follow peripherally for renal adjustments and culture results   Height: 5' 5.5" (166.4 cm) Weight: (!) 321 lb 14 oz (146 kg) IBW/kg (Calculated) : 58.15  Temp (24hrs), Avg:97.6 F (36.4 C), Min:97.2 F (36.2 C), Max:99.1 F (37.3 C)  Recent Labs  Lab 11/02/17 1526 11/03/17 0312  WBC 12.9* 13.8*  CREATININE 1.80* 1.80*    Estimated Creatinine Clearance: 39.8 mL/min (A) (by C-G formula based on SCr of 1.8 mg/dL (H)).    Allergies  Allergen Reactions  . Lisinopril Swelling    Tongue swelling   . Ace Inhibitors Other (See Comments)    unknown  . Codeine Itching and Rash  . Penicillins Itching and Rash    Has patient had a PCN reaction causing immediate rash, facial/tongue/throat swelling, SOB or lightheadedness with hypotension: unknown Has patient had a PCN reaction causing severe rash involving mucus membranes or skin necrosis: unknown Has patient had a PCN reaction that required hospitalization: no Has patient had a PCN reaction occurring within the last 10 years: no If all of the above answers are "NO", then may proceed with Cephalosporin use. TOLERATES CEPHALOSPORINS  . Tomato     Itchy rash   Antimicrobials this admission:  5/23 vanc >> 5/24 5/23 cefepime >>  Dose adjustments this admission: n/a  Microbiology results:  5/23 BCx2>> 5/23 resp cx ordered>>  Thank you for allowing pharmacy to be a part of this patient's care.  Bernadene Person, PharmD, BCPS [phone] 11/03/2017, 10:17 AM

## 2017-11-03 NOTE — Progress Notes (Addendum)
Patient HR is 46-51 bpm sustained most likely due to Diprivan (propofol) infusion. This RN notified E-link and made MD aware. MD told this RN that patient HR was ok; continue to monitor and keep patient "comfortable". RN documented note and will continue to monitor.

## 2017-11-03 NOTE — Consult Note (Signed)
   [facility] CM Inpatient Consult   11/03/2017  GRETEL CANTU 1940-06-16 [id number]    Made aware of hospitalization by [facility] Care Management BSW. Mrs. Carano is active with [facility] Care Management program.   Made inpatient RNCM aware [facility] is active.  Mrs. Bigler currently in ICU at [facility].  Will continue to follow.   Raiford Noble, MSN-Ed, RN,BSN [facility] Liaison [phone]

## 2017-11-04 ENCOUNTER — Inpatient Hospital Stay (HOSPITAL_COMMUNITY): Payer: Medicare HMO

## 2017-11-04 LAB — COMPREHENSIVE METABOLIC PANEL
ALK PHOS: 66 U/L (ref 38–126)
ALT: 13 U/L — ABNORMAL LOW (ref 14–54)
AST: 27 U/L (ref 15–41)
Albumin: 2.6 g/dL — ABNORMAL LOW (ref 3.5–5.0)
Anion gap: 9 (ref 5–15)
BILIRUBIN TOTAL: 0.4 mg/dL (ref 0.3–1.2)
BUN: 32 mg/dL — AB (ref 6–20)
CALCIUM: 8.1 mg/dL — AB (ref 8.9–10.3)
CHLORIDE: 104 mmol/L (ref 101–111)
CO2: 28 mmol/L (ref 22–32)
CREATININE: 2.57 mg/dL — AB (ref 0.44–1.00)
GFR, EST AFRICAN AMERICAN: 20 mL/min — AB (ref >60)
GFR, EST NON AFRICAN AMERICAN: 17 mL/min — AB (ref >60)
Glucose, Bld: 131 mg/dL — ABNORMAL HIGH (ref 65–99)
Potassium: 4 mmol/L (ref 3.5–5.1)
SODIUM: 141 mmol/L (ref 135–145)
TOTAL PROTEIN: 6.9 g/dL (ref 6.5–8.1)

## 2017-11-04 LAB — CBC
HCT: 24 % — ABNORMAL LOW (ref 36.0–46.0)
Hemoglobin: 7.5 g/dL — ABNORMAL LOW (ref 12.0–15.0)
MCH: 26.5 pg (ref 26.0–34.0)
MCHC: 31.3 g/dL (ref 30.0–36.0)
MCV: 84.8 fL (ref 78.0–100.0)
PLATELETS: 361 10*3/uL (ref 150–400)
RBC: 2.83 MIL/uL — AB (ref 3.87–5.11)
RDW: 18.9 % — ABNORMAL HIGH (ref 11.5–15.5)
WBC: 11.7 10*3/uL — ABNORMAL HIGH (ref 4.0–10.5)

## 2017-11-04 LAB — GLUCOSE, CAPILLARY
GLUCOSE-CAPILLARY: 127 mg/dL — AB (ref 65–99)
GLUCOSE-CAPILLARY: 125 mg/dL — AB (ref 65–99)
GLUCOSE-CAPILLARY: 134 mg/dL — AB (ref 65–99)
GLUCOSE-CAPILLARY: 125 mg/dL — AB (ref 65–99)
Glucose-Capillary: 110 mg/dL — ABNORMAL HIGH (ref 65–99)
Glucose-Capillary: 140 mg/dL — ABNORMAL HIGH (ref 65–99)

## 2017-11-04 LAB — PHOSPHORUS: PHOSPHORUS: 3.8 mg/dL (ref 2.5–4.6)

## 2017-11-04 LAB — PROCALCITONIN: Procalcitonin: 1.3 ng/mL

## 2017-11-04 MED ORDER — LINEZOLID 600 MG/300ML IV SOLN
600.0000 mg | Freq: Two times a day (BID) | INTRAVENOUS | Status: DC
  Administered 2017-11-04 – 2017-11-08 (×9): 600 mg via INTRAVENOUS
  Filled 2017-11-04 (×9): qty 300

## 2017-11-04 NOTE — Progress Notes (Signed)
PULMONARY / CRITICAL CARE MEDICINE   Name: Jamie Mason MRN: [id number] DOB: [birth year]    ADMISSION DATE:  11/02/2017  REFERRING MD:  Dr. Anitra Lauth  CHIEF COMPLAINT:  Short of breath  HISTORY OF PRESENT ILLNESS:   Hx from chart and ER staff.  77 y/o female former smoker presented to ER 5/23 with c/o leg pains for 5 days (non-ambulatory due to chronic pain/swelling).  She was to be discharged from ER after meeting with social worker.  However, in ER she became short of breath and started on Bipap. She was noted to have wheezing and CXR showed pulmonary edema.  She was given neb tx and lasix.  She become more lethargic.  ABG showed respiratory acidosis.  There was concern for pneumonia also.  She was intubated in ER and started on ABx.   SUBJECTIVE: Remains on propofol and fentanyl drip  VITAL SIGNS: BP 119/82   Pulse 64   Temp (!) 100.6 F (38.1 C)   Resp 18   Ht 5' 5.5" (1.664 m)   Wt (!) 328 lb 11.3 oz (149.1 kg)   SpO2 93%   BMI 53.87 kg/m   VENTILATOR SETTINGS: Vent Mode: PRVC FiO2 (%):  [40 %-50 %] 40 % Set Rate:  [18 bmp] 18 bmp Vt Set:  [470 mL] 470 mL PEEP:  [8 cmH20] 8 cmH20 Plateau Pressure:  [16 cmH20-28 cmH20] 23 cmH20  INTAKE / OUTPUT: I/O last 3 completed shifts: In: 2863.2 [I.V.:1874.5; NG/GT:788.7; IV Piggyback:200] Out: 440 [Urine:440]  PHYSICAL EXAMINATION: Gen:      No acute distress, elderly, obese HEENT:  EOMI, sclera anicteric, ET tube Neck:     No masses; no thyromegaly Lungs:    Clear to auscultation bilaterally; normal respiratory effort CV:         Regular rate and rhythm; no murmurs Abd:      + bowel sounds; soft, non-tender; no palpable masses, no distension Ext:    No edema; adequate peripheral perfusion Skin:      1-2+ lower extremity edema, skin changes consistent with chronic venous stasis  Neuro: Sedated, unresponsive  LABS:  BMET Recent Labs  Lab 11/02/17 1526 11/03/17 0312 11/04/17 0527  NA 141 141 141  K 3.4* 4.5 4.0   CL 102 103 104  CO2 BUN 17 19 32*  CREATININE 1.80* 1.80* 2.57*  GLUCOSE 181* 101* 131*    Electrolytes Recent Labs  Lab 11/02/17 1526  11/02/17 2029 11/03/17 0312 11/03/17 1653 11/04/17 0527  CALCIUM 8.7*  --   --  8.2*  --  8.1*  MG  --   --  1.7 1.9  --   --   PHOS  --    < > 3.9 4.6 4.1 3.8   < > = values in this interval not displayed.    CBC Recent Labs  Lab 11/02/17 1526 11/03/17 0312 11/04/17 0527  WBC 12.9* 13.8* 11.7*  HGB 9.2* 9.4* 7.5*  HCT 29.1* 30.6* 24.0*  PLT 446* 356 361    Coag's No results for input(s): APTT, INR in the last 168 hours.  Sepsis Markers Recent Labs  Lab 11/03/17 0312 11/04/17 0527  PROCALCITON 0.82 1.30    ABG Recent Labs  Lab 11/02/17 1830 11/03/17 0340 11/03/17 1000  PHART 7.279* 7.277* 7.294*  PCO2ART 59.6* 66.6* 61.8*  PO2ART 73.5* 111* 66.1*    Liver Enzymes Recent Labs  Lab 11/03/17 0312 11/04/17 0527  AST 27 27  ALT 10* 13*  ALKPHOS  76 66  BILITOT 0.6 0.4  ALBUMIN 2.8* 2.6*    Cardiac Enzymes No results for input(s): TROPONINI, PROBNP in the last 168 hours.  Glucose Recent Labs  Lab 11/03/17 1127 11/03/17 1536 11/03/17 1932 11/03/17 2302 11/04/17 0307 11/04/17 0731  GLUCAP 104* 136* 126* 112* 127* 110*    Imaging Chest x-ray 5/25-ET tube in stable position.  More focal density in the right upper lobe concerning for pneumonia.  I have reviewed the images personally.  STUDIES:  PFT 04/10/14 >> FEV1 1.50 (87%), FEV1% 77, TLC 4.54 (92%), DLCO 56% LHC 07/11/16 >> chronic occlusion mid RCA with collaterals, mod distal Lt MCA, mid and distal LAD, ostial Lt Cx, and OM2 Echo 09/17/17 >> mild LVH, EF 60 to 65%, grade 2 DD, mild LA dilation, mod RV dilation with systolic dysfx, PAS 51 mmHg  CULTURES: Blood 5/23 >> Sputum 5/23 >>  ANTIBIOTICS: Azactam 5/23 >> 5/23 Vancomycin 5/23 >> 5/24 Cefepime 5/23 >>   SIGNIFICANT EVENTS: 5/23 Admit  LINES/TUBES: ETT  5/23  DISCUSSION: 77 yo female with acute on chronic hypoxic/hypercapnic respiratory failure likely from acute pulmonary edema +/- pneumonia.  She has hx of CAD, A flutter on eliquis, HFpEF, HTN, DM.  ASSESSMENT / PLAN: Acute on chronic respiratory failure secondary to pneumonia, CHF Continue vent support Follow chest x-ray, ABG SBT's as tolerated PRN bronchodilators  Acute pulmonary edema History of coronary artery disease, hypertension, a flutter, HFpEF Hold Lasix as creatinine is higher Continue Eliquis, Coreg, aspirin, amiodarone, Lipitor  Acute kidney injury Hold Lasix.  Monitor urine output and creatinine  Right upper lobe pneumonia Continue cefepime, follow procalcitonin, cultures  Diabetes SSI coverage  Acute metabolic encephalopathy. Propofol, sedation drips  Anemia Hemoglobin 7.5 with evidence of acute bleed Follow CBC  DVT prophylaxis -Eliquis, SCDs SUP -Protonix Nutrition -tube feeds  Goals of care - full code >> she was listed as DNI during last admission in April 2019 discussion with family yesterday.  She is now do not resuscitate.  Continue vent support  The patient is critically ill with multiple organ system failure and requires high complexity decision making for assessment and support, frequent evaluation and titration of therapies, advanced monitoring, review of radiographic studies and interpretation of complex data.   Critical Care Time devoted to patient care services, exclusive of separately billable procedures, described in this note is 35 minutes.   Chilton Greathouse MD [facility] Pager [phone] If no answer or after 3pm call: [phone] 11/04/2017, 10:01 AM

## 2017-11-04 NOTE — Progress Notes (Signed)
Pt with swelling to mouth and tongue. MD made aware during rounds. No new orders at this time. Will continue to monitor.

## 2017-11-05 ENCOUNTER — Other Ambulatory Visit: Payer: Self-pay

## 2017-11-05 ENCOUNTER — Inpatient Hospital Stay (HOSPITAL_COMMUNITY): Payer: Medicare HMO

## 2017-11-05 LAB — GLUCOSE, CAPILLARY
GLUCOSE-CAPILLARY: 117 mg/dL — AB (ref 65–99)
GLUCOSE-CAPILLARY: 121 mg/dL — AB (ref 65–99)
GLUCOSE-CAPILLARY: 156 mg/dL — AB (ref 65–99)
GLUCOSE-CAPILLARY: 141 mg/dL — AB (ref 65–99)
Glucose-Capillary: 114 mg/dL — ABNORMAL HIGH (ref 65–99)
Glucose-Capillary: 114 mg/dL — ABNORMAL HIGH (ref 65–99)

## 2017-11-05 LAB — BLOOD CULTURE ID PANEL (REFLEXED)
Acinetobacter baumannii: NOT DETECTED
CANDIDA KRUSEI: NOT DETECTED
CANDIDA PARAPSILOSIS: NOT DETECTED
CANDIDA TROPICALIS: NOT DETECTED
Candida albicans: NOT DETECTED
Candida glabrata: NOT DETECTED
ENTEROCOCCUS SPECIES: NOT DETECTED
ESCHERICHIA COLI: NOT DETECTED
Enterobacter cloacae complex: NOT DETECTED
Enterobacteriaceae species: NOT DETECTED
HAEMOPHILUS INFLUENZAE: NOT DETECTED
KLEBSIELLA OXYTOCA: NOT DETECTED
Klebsiella pneumoniae: NOT DETECTED
LISTERIA MONOCYTOGENES: NOT DETECTED
Neisseria meningitidis: NOT DETECTED
PROTEUS SPECIES: NOT DETECTED
Pseudomonas aeruginosa: NOT DETECTED
SERRATIA MARCESCENS: NOT DETECTED
STAPHYLOCOCCUS AUREUS BCID: NOT DETECTED
STAPHYLOCOCCUS SPECIES: NOT DETECTED
STREPTOCOCCUS PYOGENES: NOT DETECTED
STREPTOCOCCUS SPECIES: NOT DETECTED
Streptococcus agalactiae: NOT DETECTED
Streptococcus pneumoniae: NOT DETECTED

## 2017-11-05 LAB — TRIGLYCERIDES: TRIGLYCERIDES: 67 mg/dL (ref <150)

## 2017-11-05 LAB — BASIC METABOLIC PANEL
Anion gap: 11 (ref 5–15)
BUN: 42 mg/dL — AB (ref 6–20)
CO2: 26 mmol/L (ref 22–32)
Calcium: 8.3 mg/dL — ABNORMAL LOW (ref 8.9–10.3)
Chloride: 106 mmol/L (ref 101–111)
Creatinine, Ser: 2.61 mg/dL — ABNORMAL HIGH (ref 0.44–1.00)
GFR calc Af Amer: 20 mL/min — ABNORMAL LOW (ref >60)
GFR, EST NON AFRICAN AMERICAN: 17 mL/min — AB (ref >60)
Glucose, Bld: 128 mg/dL — ABNORMAL HIGH (ref 65–99)
Potassium: 3.6 mmol/L (ref 3.5–5.1)
SODIUM: 143 mmol/L (ref 135–145)

## 2017-11-05 LAB — BLOOD GAS, ARTERIAL
Acid-Base Excess: 4.6 mmol/L — ABNORMAL HIGH (ref 0.0–2.0)
Bicarbonate: 28.7 mmol/L — ABNORMAL HIGH (ref 20.0–28.0)
DRAWN BY: 308601
FIO2: 50
MECHVT: 470 mL
O2 SAT: 95.4 %
PEEP: 8 cmH2O
PH ART: 7.44 (ref 7.350–7.450)
PO2 ART: 73.9 mmHg — AB (ref 83.0–108.0)
Patient temperature: 98.6
RATE: 18 resp/min
pCO2 arterial: 43 mmHg (ref 32.0–48.0)

## 2017-11-05 LAB — CBC
HCT: 22.6 % — ABNORMAL LOW (ref 36.0–46.0)
Hemoglobin: 7.1 g/dL — ABNORMAL LOW (ref 12.0–15.0)
MCH: 26.3 pg (ref 26.0–34.0)
MCHC: 31.4 g/dL (ref 30.0–36.0)
MCV: 83.7 fL (ref 78.0–100.0)
Platelets: 307 10*3/uL (ref 150–400)
RBC: 2.7 MIL/uL — ABNORMAL LOW (ref 3.87–5.11)
RDW: 18.8 % — ABNORMAL HIGH (ref 11.5–15.5)
WBC: 11.2 10*3/uL — ABNORMAL HIGH (ref 4.0–10.5)

## 2017-11-05 LAB — STREP PNEUMONIAE URINARY ANTIGEN: STREP PNEUMO URINARY ANTIGEN: NEGATIVE

## 2017-11-05 LAB — MAGNESIUM: MAGNESIUM: 1.9 mg/dL (ref 1.7–2.4)

## 2017-11-05 LAB — PROCALCITONIN: Procalcitonin: 1.13 ng/mL

## 2017-11-05 LAB — PHOSPHORUS: PHOSPHORUS: 3.3 mg/dL (ref 2.5–4.6)

## 2017-11-05 NOTE — Progress Notes (Signed)
PULMONARY / CRITICAL CARE MEDICINE   Name: Jamie Mason MRN: [id number] DOB: [birth year]    ADMISSION DATE:  11/02/2017  REFERRING MD:  Dr. Anitra Lauth  CHIEF COMPLAINT:  Short of breath  HISTORY OF PRESENT ILLNESS:   Hx from chart and ER staff.  77 y/o female former smoker presented to ER 5/23 with c/o leg pains for 5 days (non-ambulatory due to chronic pain/swelling).  She was to be discharged from ER after meeting with social worker.  However, in ER she became short of breath and started on Bipap. She was noted to have wheezing and CXR showed pulmonary edema.  She was given neb tx and lasix.  She become more lethargic.  ABG showed respiratory acidosis.  There was concern for pneumonia also.  She was intubated in ER and started on ABx.   SUBJECTIVE:  No acute events.  Remains ventilated, on propofol, fentanyl drips   VITAL SIGNS: BP (!) 127/42 (BP Location: Left Wrist)   Pulse (!) 49   Temp 98.1 F (36.7 C) (Oral)   Resp (!) 23   Ht 5' 5.5" (1.664 m)   Wt (!) 330 lb 4 oz (149.8 kg)   SpO2 95%   BMI 54.12 kg/m   VENTILATOR SETTINGS: Vent Mode: PSV FiO2 (%):  [50 %] 50 % Set Rate:  [18 bmp] 18 bmp Vt Set:  [470 mL] 470 mL PEEP:  [8 cmH20] 8 cmH20 Pressure Support:  [16 cmH20] 16 cmH20 Plateau Pressure:  [22 cmH20-29 cmH20] 29 cmH20  INTAKE / OUTPUT: I/O last 3 completed shifts: In: 2646.5 [I.V.:1198.5; NG/GT:948; IV Piggyback:500] Out: 440 [Urine:440]  PHYSICAL EXAMINATION: Gen:      No acute distress obese HEENT:  EOMI, sclera anicteric, ET tube Neck:     No masses; no thyromegaly Lungs:    Clear to auscultation bilaterally; normal respiratory effort CV:         Regular rate and rhythm; no murmurs Abd:      + bowel sounds; soft, non-tender; no palpable masses, no distension Ext:    1-2+ edema; adequate peripheral perfusion Skin:      Warm and dry; no rash Neuro: Sedated, unresponsive  LABS:  BMET Recent Labs  Lab 11/03/17 0312 11/04/17 0527 11/05/17 0345   NA 141 141 143  K 4.5 4.0 3.6  CL 103 104 106  CO2 BUN 19 32* 42*  CREATININE 1.80* 2.57* 2.61*  GLUCOSE 101* 131* 128*    Electrolytes Recent Labs  Lab 11/02/17 2029 11/03/17 0312 11/03/17 1653 11/04/17 0527 11/05/17 0345  CALCIUM  --  8.2*  --  8.1* 8.3*  MG 1.7 1.9  --   --  1.9  PHOS 3.9 4.6 4.1 3.8 3.3    CBC Recent Labs  Lab 11/03/17 0312 11/04/17 0527 11/05/17 0345  WBC 13.8* 11.7* 11.2*  HGB 9.4* 7.5* 7.1*  HCT 30.6* 24.0* 22.6*  PLT 356 361 307    Coag's No results for input(s): APTT, INR in the last 168 hours.  Sepsis Markers Recent Labs  Lab 11/03/17 0312 11/04/17 0527 11/05/17 0345  PROCALCITON 0.82 1.30 1.13    ABG Recent Labs  Lab 11/03/17 0340 11/03/17 1000 11/05/17 0400  PHART 7.277* 7.294* 7.440  PCO2ART 66.6* 61.8* 43.0  PO2ART 111* 66.1* 73.9*    Liver Enzymes Recent Labs  Lab 11/03/17 0312 11/04/17 0527  AST 27 27  ALT 10* 13*  ALKPHOS 76 66  BILITOT 0.6 0.4  ALBUMIN 2.8* 2.6*  Cardiac Enzymes No results for input(s): TROPONINI, PROBNP in the last 168 hours.  Glucose Recent Labs  Lab 11/04/17 1140 11/04/17 1559 11/04/17 1924 11/04/17 2314 11/05/17 0327 11/05/17 0743  GLUCAP 140* 125* 134* 125* 117* 114*    Imaging Chest x-ray 5/25-ET tube in stable position.  More focal density in the right upper lobe concerning for pneumonia.   Chest x-ray 5/26-right upper lobe consolidation has improved.  ET tube in stable pressure I have reviewed the images personally  STUDIES:  PFT 04/10/14 >> FEV1 1.50 (87%), FEV1% 77, TLC 4.54 (92%), DLCO 56% LHC 07/11/16 >> chronic occlusion mid RCA with collaterals, mod distal Lt MCA, mid and distal LAD, ostial Lt Cx, and OM2 Echo 09/17/17 >> mild LVH, EF 60 to 65%, grade 2 DD, mild LA dilation, mod RV dilation with systolic dysfx, PAS 51 mmHg  CULTURES: Blood 5/23 >> gram-positive rods Sputum 5/23 >>  ANTIBIOTICS: Azactam 5/23 >> 5/23 Vancomycin 5/23 >>  5/24 Cefepime 5/23 >>  Zyvox 5/25 >>  SIGNIFICANT EVENTS: 5/23 Admit  LINES/TUBES: ETT 5/23  DISCUSSION: 77 yo female with acute on chronic hypoxic/hypercapnic respiratory failure likely from acute pulmonary edema +/- pneumonia.  She has hx of CAD, A flutter on eliquis, HFpEF, HTN, DM.  ASSESSMENT / PLAN: Acute on chronic respiratory failure secondary to pneumonia, CHF Continue vent support. Follow chest x-ray, ABG SBT's as tolerated PRN bronchodilators  Acute pulmonary edema History of coronary artery disease, hypertension, a flutter, HFpEF Holding Lasix as creatinine as high Stop amiodarone and Coreg as she is bradycardic Continue Eliquis, aspirin, Lipitor  Acute kidney injury Monitor urine output and creatinine.  Off diuretics  Right upper lobe pneumonia Continue cefepime, added Zyvox 5/25 Gram-positive rods in blood culture.  Follow final results.  Diabetes SSI coverage  Acute metabolic encephalopathy. Propofol, fentanyl drips  Anemia Hemoglobin 7.1 with no evidence of acute bleed Follow CBC.  Transfuse for hemoglobin less than 7.  DVT prophylaxis -Eliquis, SCDs SUP -Protonix Nutrition -tube feeds  Goals of care - full code >> she was listed as DNI during last admission in April 2019 discussion with family yesterday.  She is now do not resuscitate.  Continue vent support  The patient is critically ill with multiple organ system failure and requires high complexity decision making for assessment and support, frequent evaluation and titration of therapies, advanced monitoring, review of radiographic studies and interpretation of complex data.   Critical Care Time devoted to patient care services, exclusive of separately billable procedures, described in this note is 34 minutes.   Chilton Greathouse MD [facility] Pager [phone] If no answer or after 3pm call: [phone] 11/05/2017, 9:15 AM

## 2017-11-05 NOTE — Progress Notes (Addendum)
MD paged in regards to patient's HR sustaining around 47-49 BPM. MD previously aware of bradycardia (HR low 50s), however, with increase in sedation (due to increased agitation), patient's HR has slightly decreased. Orders received from MD to continue monitoring HR, as well as BP. Will continue to monitor.

## 2017-11-05 NOTE — Progress Notes (Signed)
PHARMACY - PHYSICIAN COMMUNICATION CRITICAL VALUE ALERT - BLOOD CULTURE IDENTIFICATION (BCID)  Jamie Mason is an 77 y.o. female who presented to [facility] on 11/02/2017 with a chief complaint of SOB  Assessment: Patient with HCAP, now with 1/4 bottles GPR in blood  Name of physician (or Provider) Contacted: none, Dr. Isaiah Serge already aware and agree with current plan  Current antibiotics: Linezolid + Cefepime  Changes to prescribed antibiotics recommended:  Patient is on recommended antibiotics - No changes needed   Most likely contamination but should still be covered by current regimen. Will not narrow at this time as patient declining yesterday to the point that MRSA coverage was added back.   Results for orders placed or performed during the hospital encounter of 11/02/17  Blood Culture ID Panel (Reflexed) (Collected: 11/02/2017  5:15 PM)  Result Value Ref Range   Enterococcus species NOT DETECTED NOT DETECTED   Listeria monocytogenes NOT DETECTED NOT DETECTED   Staphylococcus species NOT DETECTED NOT DETECTED   Staphylococcus aureus NOT DETECTED NOT DETECTED   Streptococcus species NOT DETECTED NOT DETECTED   Streptococcus agalactiae NOT DETECTED NOT DETECTED   Streptococcus pneumoniae NOT DETECTED NOT DETECTED   Streptococcus pyogenes NOT DETECTED NOT DETECTED   Acinetobacter baumannii NOT DETECTED NOT DETECTED   Enterobacteriaceae species NOT DETECTED NOT DETECTED   Enterobacter cloacae complex NOT DETECTED NOT DETECTED   Escherichia coli NOT DETECTED NOT DETECTED   Klebsiella oxytoca NOT DETECTED NOT DETECTED   Klebsiella pneumoniae NOT DETECTED NOT DETECTED   Proteus species NOT DETECTED NOT DETECTED   Serratia marcescens NOT DETECTED NOT DETECTED   Haemophilus influenzae NOT DETECTED NOT DETECTED   Neisseria meningitidis NOT DETECTED NOT DETECTED   Pseudomonas aeruginosa NOT DETECTED NOT DETECTED   Candida albicans NOT DETECTED NOT DETECTED   Candida glabrata NOT  DETECTED NOT DETECTED   Candida krusei NOT DETECTED NOT DETECTED   Candida parapsilosis NOT DETECTED NOT DETECTED   Candida tropicalis NOT DETECTED NOT DETECTED    Jamie Mason A 11/05/2017  10:17 AM

## 2017-11-06 ENCOUNTER — Other Ambulatory Visit: Payer: Self-pay | Admitting: Internal Medicine

## 2017-11-06 ENCOUNTER — Inpatient Hospital Stay (HOSPITAL_COMMUNITY): Payer: Medicare HMO

## 2017-11-06 ENCOUNTER — Other Ambulatory Visit: Payer: Self-pay

## 2017-11-06 LAB — CBC
HEMATOCRIT: 23.3 % — AB (ref 36.0–46.0)
HEMOGLOBIN: 7.3 g/dL — AB (ref 12.0–15.0)
MCH: 26.2 pg (ref 26.0–34.0)
MCHC: 31.3 g/dL (ref 30.0–36.0)
MCV: 83.5 fL (ref 78.0–100.0)
Platelets: 315 10*3/uL (ref 150–400)
RBC: 2.79 MIL/uL — ABNORMAL LOW (ref 3.87–5.11)
RDW: 18.9 % — ABNORMAL HIGH (ref 11.5–15.5)
WBC: 13.3 10*3/uL — AB (ref 4.0–10.5)

## 2017-11-06 LAB — PHOSPHORUS: PHOSPHORUS: 4 mg/dL (ref 2.5–4.6)

## 2017-11-06 LAB — LEGIONELLA PNEUMOPHILA SEROGP 1 UR AG: L. pneumophila Serogp 1 Ur Ag: NEGATIVE

## 2017-11-06 LAB — GLUCOSE, CAPILLARY
GLUCOSE-CAPILLARY: 130 mg/dL — AB (ref 65–99)
GLUCOSE-CAPILLARY: 133 mg/dL — AB (ref 65–99)
Glucose-Capillary: 122 mg/dL — ABNORMAL HIGH (ref 65–99)
Glucose-Capillary: 117 mg/dL — ABNORMAL HIGH (ref 65–99)
Glucose-Capillary: 132 mg/dL — ABNORMAL HIGH (ref 65–99)
Glucose-Capillary: 129 mg/dL — ABNORMAL HIGH (ref 65–99)

## 2017-11-06 LAB — BASIC METABOLIC PANEL
Anion gap: 13 (ref 5–15)
BUN: 52 mg/dL — ABNORMAL HIGH (ref 6–20)
CHLORIDE: 105 mmol/L (ref 101–111)
CO2: 25 mmol/L (ref 22–32)
Calcium: 8.3 mg/dL — ABNORMAL LOW (ref 8.9–10.3)
Creatinine, Ser: 2.79 mg/dL — ABNORMAL HIGH (ref 0.44–1.00)
GFR calc non Af Amer: 16 mL/min — ABNORMAL LOW (ref >60)
GFR, EST AFRICAN AMERICAN: 18 mL/min — AB (ref >60)
Glucose, Bld: 120 mg/dL — ABNORMAL HIGH (ref 65–99)
POTASSIUM: 4.3 mmol/L (ref 3.5–5.1)
Sodium: 143 mmol/L (ref 135–145)

## 2017-11-06 LAB — MAGNESIUM: Magnesium: 2 mg/dL (ref 1.7–2.4)

## 2017-11-06 MED ORDER — FUROSEMIDE 10 MG/ML IJ SOLN
80.0000 mg | Freq: Four times a day (QID) | INTRAMUSCULAR | Status: DC
  Administered 2017-11-06 – 2017-11-07 (×4): 80 mg via INTRAVENOUS
  Filled 2017-11-06 (×3): qty 8

## 2017-11-06 MED ORDER — FUROSEMIDE 10 MG/ML IJ SOLN
40.0000 mg | Freq: Two times a day (BID) | INTRAMUSCULAR | Status: DC
  Administered 2017-11-06: 40 mg via INTRAVENOUS
  Filled 2017-11-06: qty 4

## 2017-11-06 MED ORDER — FUROSEMIDE 10 MG/ML IJ SOLN
INTRAMUSCULAR | Status: AC
  Filled 2017-11-06: qty 8

## 2017-11-06 NOTE — Progress Notes (Signed)
PULMONARY / CRITICAL CARE MEDICINE   Name: Jamie Mason MRN: [id number] DOB: May 12, 1941    ADMISSION DATE:  11/02/2017  REFERRING MD:  Dr. Anitra Lauth  CHIEF COMPLAINT:  Short of breath  HISTORY OF PRESENT ILLNESS:   Hx from chart and ER staff.  78 y/o female former smoker presented to ER 5/23 with c/o leg pains for 5 days (non-ambulatory due to chronic pain/swelling).  She was to be discharged from ER after meeting with social worker.  However, in ER she became short of breath and started on Bipap. She was noted to have wheezing and CXR showed pulmonary edema.  She was given neb tx and lasix.  She become more lethargic.  ABG showed respiratory acidosis.  There was concern for pneumonia also.  She was intubated in ER and started on ABx.  SUBJECTIVE:  Lasix reinitiated as urine output is dropping.  Creatinine continues to increase Off propofol today a.m.  Continues on fentanyl On pressure support wean off 10/5  VITAL SIGNS: BP (!) 139/55   Pulse (!) 45   Temp 98.1 F (36.7 C)   Resp 13   Ht 5' 5.5" (1.664 m)   Wt (!) 330 lb 4 oz (149.8 kg)   SpO2 99%   BMI 54.12 kg/m   VENTILATOR SETTINGS: Vent Mode: PSV;CPAP FiO2 (%):  [30 %-50 %] 40 % Set Rate:  [18 bmp] 18 bmp Vt Set:  [470 mL] 470 mL PEEP:  [5 cmH20-8 cmH20] 5 cmH20 Pressure Support:  [10 cmH20] 10 cmH20 Plateau Pressure:  [22 cmH20-42 cmH20] 23 cmH20  INTAKE / OUTPUT: I/O last 3 completed shifts: In: 3116.6 [I.V.:1247.9; NG/GT:1068.7; IV Piggyback:800] Out: 395 [Urine:395]  PHYSICAL EXAMINATION: Gen:      No acute distress, obese HEENT:  EOMI, sclera anicteric, ET tube.  Lip, tongue swelling Neck:     No masses; no thyromegaly Lungs:    Clear to auscultation bilaterally; normal respiratory effort CV:         Regular rate and rhythm; no murmurs Abd:      + bowel sounds; soft, non-tender; no palpable masses, no distension Ext:    No edema; adequate peripheral perfusion Skin:      Warm and dry; no rash Neuro:  Sedated, unresponsive  LABS:  BMET Recent Labs  Lab 11/04/17 0527 11/05/17 0345 11/06/17 0342  NA 141 143 143  K 4.0 3.6 4.3  CL 104 106 105  CO2 BUN 32* 42* 52*  CREATININE 2.57* 2.61* 2.79*  GLUCOSE 131* 128* 120*    Electrolytes Recent Labs  Lab 11/03/17 0312  11/04/17 0527 11/05/17 0345 11/06/17 0342  CALCIUM 8.2*  --  8.1* 8.3* 8.3*  MG 1.9  --   --  1.9 2.0  PHOS 4.6   < > 3.8 3.3 4.0   < > = values in this interval not displayed.    CBC Recent Labs  Lab 11/04/17 0527 11/05/17 0345 11/06/17 0342  WBC 11.7* 11.2* 13.3*  HGB 7.5* 7.1* 7.3*  HCT 24.0* 22.6* 23.3*  PLT 361 307 315    Coag's No results for input(s): APTT, INR in the last 168 hours.  Sepsis Markers Recent Labs  Lab 11/03/17 0312 11/04/17 0527 11/05/17 0345  PROCALCITON 0.82 1.30 1.13    ABG Recent Labs  Lab 11/03/17 0340 11/03/17 1000 11/05/17 0400  PHART 7.277* 7.294* 7.440  PCO2ART 66.6* 61.8* 43.0  PO2ART 111* 66.1* 73.9*    Liver Enzymes Recent Labs  Lab 11/03/17  1914 11/04/17 0527  AST 27 27  ALT 10* 13*  ALKPHOS 76 66  BILITOT 0.6 0.4  ALBUMIN 2.8* 2.6*    Cardiac Enzymes No results for input(s): TROPONINI, PROBNP in the last 168 hours.  Glucose Recent Labs  Lab 11/05/17 1204 11/05/17 1640 11/05/17 2053 11/05/17 2331 11/06/17 0353 11/06/17 0804  GLUCAP 121* 156* 141* 114* 122* 130*    Imaging Chest x-ray 5/25-ET tube in stable position.  More focal density in the right upper lobe concerning for pneumonia.   Chest x-ray 5/26-right upper lobe consolidation has improved.  ET tube in stable pressure Chest x-ray 5/27-persistent right lung opacity.  ET tube in good position. I reviewed the images personally.  STUDIES:  PFT 04/10/14 >> FEV1 1.50 (87%), FEV1% 77, TLC 4.54 (92%), DLCO 56% LHC 07/11/16 >> chronic occlusion mid RCA with collaterals, mod distal Lt MCA, mid and distal LAD, ostial Lt Cx, and OM2 Echo 09/17/17 >> mild LVH, EF 60 to  65%, grade 2 DD, mild LA dilation, mod RV dilation with systolic dysfx, PAS 51 mmHg  CULTURES: Blood 5/23 >> gram-positive rods Sputum 5/23 >>  ANTIBIOTICS: Azactam 5/23 >> 5/23 Vancomycin 5/23 >> 5/24 Cefepime 5/23 >>  Zyvox 5/25 >>  SIGNIFICANT EVENTS: 5/23 Admit  LINES/TUBES: ETT 5/23  DISCUSSION: 77 yo female with acute on chronic hypoxic/hypercapnic respiratory failure likely from acute pulmonary edema +/- pneumonia.  She has hx of CAD, A flutter on eliquis, HFpEF, HTN, DM.  ASSESSMENT / PLAN: Acute on chronic respiratory failure secondary to pneumonia, CHF Continue vent support Follow chest x-ray, ABG SBT's as tolerated PRN bronchodilators  Acute pulmonary edema History of coronary artery disease, hypertension, a flutter, HFpEF Off amiodarone, Coreg as she remains bradycardic Continue Eliquis, aspirin, Lipitor  Acute kidney injury Low-dose urine output with volume overload Try Lasix challenge to see if we can get her urine output up Follow BUN/creatinine  Right upper lobe pneumonia Continue cefepime, added Zyvox 5/25 Gram-positive rods and blood culture.  Follow final cultures  Diabetes SSI coverage  Acute metabolic encephalopathy. Continue fentanyl.  Wean off propofol.  Anemia Hemoglobin 7.3 with no evidence of acute bleed Follow CBC.  Transfuse for hemoglobin less than 7  DVT prophylaxis -Eliquis, SCDs SUP -Protonix Nutrition -tube feeds  Goals of care - full code >> she was listed as DNI during last admission in April 2019.  Discussed with family at this visit.  She is now do not resuscitate. Family is okay with short-term vent support.  The patient is critically ill with multiple organ system failure and requires high complexity decision making for assessment and support, frequent evaluation and titration of therapies, advanced monitoring, review of radiographic studies and interpretation of complex data.   Critical Care Time devoted to patient  care services, exclusive of separately billable procedures, described in this note is 35 minutes.   Chilton Greathouse MD [facility] Pager [phone] If no answer or after 3pm call: [phone] 11/06/2017, 9:26 AM

## 2017-11-06 NOTE — Progress Notes (Signed)
eLink Physician-Brief Progress Note Patient Name: Jamie Mason DOB: Jul 06, 1940 MRN: [id number]   Date of Service  11/06/2017  HPI/Events of Note  Rising airway pressures with pink sputum. Acute renal failure with poor UOP and rising creat  eICU Interventions  Scheduled lasix     Intervention Category Major Interventions: Respiratory failure - evaluation and management  Henry Russel, P 11/06/2017, 12:02 AM

## 2017-11-07 ENCOUNTER — Inpatient Hospital Stay (HOSPITAL_COMMUNITY): Payer: Medicare HMO

## 2017-11-07 ENCOUNTER — Ambulatory Visit: Payer: Self-pay

## 2017-11-07 DIAGNOSIS — I5031 Acute diastolic (congestive) heart failure: Secondary | ICD-10-CM

## 2017-11-07 DIAGNOSIS — J810 Acute pulmonary edema: Secondary | ICD-10-CM

## 2017-11-07 LAB — BLOOD GAS, ARTERIAL
Acid-Base Excess: 0.2 mmol/L (ref 0.0–2.0)
Bicarbonate: 26.4 mmol/L (ref 20.0–28.0)
Drawn by: 11249
FIO2: 40
MECHVT: 470 mL
O2 SAT: 92.7 %
PATIENT TEMPERATURE: 98.4
PCO2 ART: 55 mmHg — AB (ref 32.0–48.0)
PEEP: 5 cmH2O
PH ART: 7.302 — AB (ref 7.350–7.450)
RATE: 18 resp/min
pO2, Arterial: 73 mmHg — ABNORMAL LOW (ref 83.0–108.0)

## 2017-11-07 LAB — CBC
HCT: 23.6 % — ABNORMAL LOW (ref 36.0–46.0)
HEMOGLOBIN: 7.1 g/dL — AB (ref 12.0–15.0)
MCH: 26 pg (ref 26.0–34.0)
MCHC: 30.1 g/dL (ref 30.0–36.0)
MCV: 86.4 fL (ref 78.0–100.0)
Platelets: 326 10*3/uL (ref 150–400)
RBC: 2.73 MIL/uL — AB (ref 3.87–5.11)
RDW: 19.2 % — ABNORMAL HIGH (ref 11.5–15.5)
WBC: 9.9 10*3/uL (ref 4.0–10.5)

## 2017-11-07 LAB — GLUCOSE, CAPILLARY
GLUCOSE-CAPILLARY: 111 mg/dL — AB (ref 65–99)
Glucose-Capillary: 125 mg/dL — ABNORMAL HIGH (ref 65–99)
Glucose-Capillary: 108 mg/dL — ABNORMAL HIGH (ref 65–99)
Glucose-Capillary: 136 mg/dL — ABNORMAL HIGH (ref 65–99)

## 2017-11-07 LAB — CULTURE, BLOOD (ROUTINE X 2): SPECIAL REQUESTS: ADEQUATE

## 2017-11-07 LAB — BASIC METABOLIC PANEL
ANION GAP: 12 (ref 5–15)
BUN: 63 mg/dL — ABNORMAL HIGH (ref 6–20)
CHLORIDE: 107 mmol/L (ref 101–111)
CO2: 24 mmol/L (ref 22–32)
Calcium: 8.1 mg/dL — ABNORMAL LOW (ref 8.9–10.3)
Creatinine, Ser: 3.36 mg/dL — ABNORMAL HIGH (ref 0.44–1.00)
GFR calc non Af Amer: 12 mL/min — ABNORMAL LOW (ref >60)
GFR, EST AFRICAN AMERICAN: 14 mL/min — AB (ref >60)
GLUCOSE: 132 mg/dL — AB (ref 65–99)
Potassium: 4.5 mmol/L (ref 3.5–5.1)
Sodium: 143 mmol/L (ref 135–145)

## 2017-11-07 LAB — MAGNESIUM: MAGNESIUM: 2 mg/dL (ref 1.7–2.4)

## 2017-11-07 LAB — PHOSPHORUS: PHOSPHORUS: 4.9 mg/dL — AB (ref 2.5–4.6)

## 2017-11-07 MED ORDER — FUROSEMIDE 10 MG/ML IJ SOLN
120.0000 mg | Freq: Two times a day (BID) | INTRAVENOUS | Status: DC
  Administered 2017-11-07 – 2017-11-09 (×4): 120 mg via INTRAVENOUS
  Filled 2017-11-07: qty 10
  Filled 2017-11-07: qty 2
  Filled 2017-11-07: qty 10
  Filled 2017-11-07: qty 12
  Filled 2017-11-07: qty 10

## 2017-11-07 MED ORDER — FUROSEMIDE 10 MG/ML IJ SOLN: 120.0000 mg | Freq: Two times a day (BID) | INTRAVENOUS | Status: DC

## 2017-11-07 MED ORDER — SODIUM CHLORIDE 0.9 % IV SOLN
1.0000 g | INTRAVENOUS | Status: DC
  Administered 2017-11-08: 1 g via INTRAVENOUS
  Filled 2017-11-07 (×2): qty 1

## 2017-11-07 NOTE — Progress Notes (Signed)
Nutrition Follow-up  DOCUMENTATION CODES:   Morbid obesity  INTERVENTION:  - Will adjust TF regimen based on new documentation of wounds with desire to adequately meet estimated protein needs. - Will order: Vital High Protein @ 35 mL/hr with 60 mL Prostat TID. This regimen will provide 1440 kcal, 163 grams of protein, and 702 mL free water.   NUTRITION DIAGNOSIS:   Inadequate oral intake related to inability to eat as evidenced by NPO status. -ongoing  GOAL:   Provide needs based on ASPEN/SCCM guidelines -met with TF regimen  MONITOR:   Vent status, Labs, Weight trends, TF tolerance, Skin, I & O's  ASSESSMENT:   77 y.o. Admitted on 11/02/17 for acute respiratory failure with respiratory acidosis with some concerns for pneumonia and/or sepsis. PMH of a fib., CAD, U7ML, diastolic HF, HLD, HTN, lumbar disc disease, NSTEMI. Pt former smoker (quit date 2012).   Weight trending up since admission; now +17 lbs/7.4 kg compared to weight on admission. Pt remains intubated with OGT in place. She is currently receiving Vital High Protein @ 40 mL/hr with 30 mL Prostat BID. This order is providing which is providing 1160 kcal, 114 grams of protein, and 802 mL free water. Pt is now off of Propofol since assessment on 5/24.   Pt's daughter was at bedside and all nutrition-related questions answered at this time. No BM since PTA (at least 5 days).   Per Pete's note this AM: respiratory failure 2/2 acute heart failure and presence of pulmonary edema, aiming for negative fluid balance with aggressive diuresis in place, AKI, possible addition of Precedex if needed within the next 24 hours.  Patient is currently intubated on ventilator support MV: 6.4 L/min Temp (24hrs), Avg:98.5 F (36.9 C), Min:97.9 F (36.6 C), Max:99 F (37.2 C) Propofol: none BP: 105/82 and MAP: 88  Medications reviewed; 120 mg IV Lasix BID, sliding scale Novolog, 40 mg Protonix per OGT/day. Labs reviewed; creatinine: 3.36  mg/dL, Ca: 8.1 mg/dL, Phos: 4.9 mg/dL, GFR: 14 mL/min.   Drip: Fentanyl '@150'  mcg/kg.    Diet Order:   Diet Order           Diet NPO time specified  Diet effective now          EDUCATION NEEDS:   Not appropriate for education at this time  Skin:  Skin Assessment: Skin Integrity Issues: Skin Integrity Issues:: Stage III, Stage II Stage II: L buttocks Stage III: L buttocks and L thigh  Last BM:  PTA/unknown  Height:   Ht Readings from Last 1 Encounters:  11/02/17 5' 5.5" (1.664 m)    Weight:   Wt Readings from Last 1 Encounters:  11/07/17 (!) 338 lb 3 oz (153.4 kg)    Ideal Body Weight:  57.95 kg  BMI:  Body mass index is 55.42 kg/m.  Estimated Nutritional Needs:   Kcal:  1275-1450 kcal  Protein:  145-160 grams  Fluid:  >/= 1.3 L or per MD      Jarome Matin, MS, RD, LDN, [facility] Inpatient Clinical Dietitian Pager # [phone] After hours/weekend pager # [phone]

## 2017-11-07 NOTE — Progress Notes (Signed)
Pt received  IV lasix at noon. Pt with 25cc urine out since then. Anders Simmonds, NP notified. No new orders at this time. Will continue to monitor.

## 2017-11-07 NOTE — Progress Notes (Signed)
PHARMACY NOTE:  ANTIMICROBIAL RENAL DOSAGE ADJUSTMENT  Current antimicrobial regimen includes a mismatch between antimicrobial dosage and estimated renal function.  As per policy approved by the Pharmacy & Therapeutics and Medical Executive Committees, the antimicrobial dosage will be adjusted accordingly.  Current antimicrobial dosage:  Cefepime 1gm IV q12h  Indication: PNA  Renal Function:  Estimated Creatinine Clearance: 22 mL/min (A) (by C-G formula based on SCr of 3.36 mg/dL (H)).      On intermittent HD, scheduled:      On CRRT    Antimicrobial dosage has been changed to:  Cefepime 1gm IV q24h   Thank you for allowing pharmacy to be a part of this patient's care.  Lucia Gaskins, [facility] 11/07/2017 12:33 PM

## 2017-11-07 NOTE — Consult Note (Signed)
   [facility] CM Inpatient Consult   11/07/2017  Jamie Mason 1940-10-14 [id number]    [facility] Care Management follow up. Patient is active with [facility] Care Management program.  Mrs. Buchholz remains in ICU on vent.   Will continue to follow hospital course and update [facility] team as needed.    Raiford Noble, MSN-Ed, RN,BSN [facility] Liaison [phone]

## 2017-11-07 NOTE — Progress Notes (Signed)
PULMONARY / CRITICAL CARE MEDICINE   Name: Jamie Mason MRN: [id number] DOB: 05/16/1941    ADMISSION DATE:  11/02/2017  REFERRING MD:  Dr. Anitra Lauth  CHIEF COMPLAINT:  Short of breath  HISTORY OF PRESENT ILLNESS:   Hx from chart and ER staff.  77 y/o female former smoker presented to ER 5/23 with c/o leg pains for 5 days (non-ambulatory due to chronic pain/swelling).  She was to be discharged from ER after meeting with social worker.  However, in ER she became short of breath and started on Bipap. She was noted to have wheezing and CXR showed pulmonary edema.  She was given neb tx and lasix.  She become more lethargic.  ABG showed respiratory acidosis.  There was concern for pneumonia also.  She was intubated in ER and started on ABx.  SUBJECTIVE:  Sedated, currently has propofol turned off.  VITAL SIGNS: Blood Pressure (Abnormal) 128/42   Pulse (Abnormal) 40   Temperature 97.9 F (36.6 C)   Respiration 18   Height 5' 5.5" (1.664 m)   Weight (Abnormal) 338 lb 3 oz (153.4 kg)   Oxygen Saturation 97%   Body Mass Index 55.42 kg/m   VENTILATOR SETTINGS: Vent Mode: PRVC FiO2 (%):  [40 %] 40 % Set Rate:  [18 bmp] 18 bmp Vt Set:  [470 mL] 470 mL PEEP:  [5 cmH20] 5 cmH20 Plateau Pressure:  [22 cmH20-32 cmH20] 23 cmH20  INTAKE / OUTPUT:  Intake/Output Summary (Last 24 hours) at 11/07/2017 0851 Last data filed at 11/07/2017 0800 Gross per 24 hour  Intake 2950.29 ml  Output 354 ml  Net 2596.29 ml     PHYSICAL EXAMINATION: General: 77 year old African-American female, sedated on ventilator HEENT normocephalic atraumatic orally intubated no clear jugular venous distention Pulmonary: Diminished throughout, no accessory use with mechanical ventilatory breaths Cardiac: Systolic murmur appreciated, regular atrial fibrillation on telemetry Extremities: Warm and dry, chronic venous stasis changes strong pulses Abdomen: Soft nontender positive bowel sounds tolerating tube  feeds Neuro: Agitated with stimulation, localizes, reaches for tubes.  LABS: BMET Recent Labs  Lab 11/05/17 0345 11/06/17 0342 11/07/17 0343  NA 143 143 143  K 3.6 4.3 4.5  CL 106 105 107  CO2 BUN 42* 52* 63*  CREATININE 2.61* 2.79* 3.36*  GLUCOSE 128* 120* 132*    Electrolytes Recent Labs  Lab 11/05/17 0345 11/06/17 0342 11/07/17 0343  CALCIUM 8.3* 8.3* 8.1*  MG 1.9 2.0 2.0  PHOS 3.3 4.0 4.9*    CBC Recent Labs  Lab 11/05/17 0345 11/06/17 0342 11/07/17 0343  WBC 11.2* 13.3* 9.9  HGB 7.1* 7.3* 7.1*  HCT 22.6* 23.3* 23.6*  PLT 307 315 326    Coag's No results for input(s): APTT, INR in the last 168 hours.  Sepsis Markers Recent Labs  Lab 11/03/17 0312 11/04/17 0527 11/05/17 0345  PROCALCITON 0.82 1.30 1.13    ABG Recent Labs  Lab 11/03/17 1000 11/05/17 0400 11/07/17 0350  PHART 7.294* 7.440 7.302*  PCO2ART 61.8* 43.0 55.0*  PO2ART 66.1* 73.9* 73.0*    Liver Enzymes Recent Labs  Lab 11/03/17 0312 11/04/17 0527  AST 27 27  ALT 10* 13*  ALKPHOS 76 66  BILITOT 0.6 0.4  ALBUMIN 2.8* 2.6*    Cardiac Enzymes No results for input(s): TROPONINI, PROBNP in the last 168 hours.  Glucose Recent Labs  Lab 11/06/17 1147 11/06/17 1529 11/06/17 1914 11/06/17 2302 11/07/17 0306 11/07/17 0742  GLUCAP 117* 132* 133* 129* 125* 108*  Imaging Chest x-ray 5/25-ET tube in stable position.  More focal density in the right upper lobe concerning for pneumonia.   Chest x-ray 5/26-right upper lobe consolidation has improved.  ET tube in stable pressure Chest x-ray 5/27-persistent right lung opacity.  ET tube in good position. I reviewed the images personally.  STUDIES:  PFT 04/10/14 >> FEV1 1.50 (87%), FEV1% 77, TLC 4.54 (92%), DLCO 56% LHC 07/11/16 >> chronic occlusion mid RCA with collaterals, mod distal Lt MCA, mid and distal LAD, ostial Lt Cx, and OM2 Echo 09/17/17 >> mild LVH, EF 60 to 65%, grade 2 DD, mild LA dilation, mod RV  dilation with systolic dysfx, PAS 51 mmHg  CULTURES: Blood 5/23 >> gram-positive rods Sputum 5/23 >>  ANTIBIOTICS: Azactam 5/23 >> 5/23 Vancomycin 5/23 >> 5/24 Cefepime 5/23 >>  Zyvox 5/25 >>  SIGNIFICANT EVENTS: 5/23 Admit  LINES/TUBES: ETT 5/23  DISCUSSION: 77 yo female with acute on chronic hypoxic/hypercapnic respiratory failure likely from acute pulmonary edema +/- pneumonia.  She has hx of CAD, A flutter on eliquis, HFpEF, HTN, DM.  ASSESSMENT / PLAN:  Acute on chronic respiratory failure secondary to acute heart failure w/ pulmonary edema +/- PNA -urine antigens negative -still 9.5 liters + -PCXR personally reviewed: : Endotracheal tubes in satisfactory position.  Slightly rotated film.  Cardiomegaly.  Right greater than left airspace disease, overall aeration worse when comparing prior film Plan Continue full ventilator support PAD protocol RASS goal -1 to -2 Day #6 antibiotics, today is day #6 cefepime and 4 linezolid; likely discontinue after 8 days total therapy Push diuresis, aiming for negative fluid balance  1 of 2 Corynebacterium blood cultures. Likely contamination RID negative Plan F/u pending BC    History of coronary artery disease, hypertension, a flutter, HFpEF -Off amiodarone, Coreg as she remains bradycardic Plan Cont tele Cont Eliquis, asa and lipitor  Lasix as above  Acute kidney injury -cr climbing. Volume overloaded.  -as BP to diurese; takes  bid at home.  Plan Lasix  IV bid Renal dose meds Strict I&O Will need to d/w family. Do not think that she is a good candidate for long term dialysis   Acute metabolic encephalopathy. Plan PAD protocol RAS goal -2 Try to stop propofol; may transition to precedex next 24hrs  Diabetes Glycemic control is acceptable  Plan ssi   Anemia -no evidence of bleeding hgb 7.1 Plan Trend cbc Transfuse for hgb <7  DVT prophylaxis -Eliquis, SCDs SUP -Protonix Nutrition -tube  feeds  Goals of care - full code >> she was listed as DNI during last admission in April 2019.  Discussed with family at this visit.  She is now do not resuscitate. Family is okay with short-term vent support.  Simonne Martinet ACNP-BC [facility] Pulmonary/Critical Care Pager # [phone] OR # [phone] if no answer

## 2017-11-08 ENCOUNTER — Inpatient Hospital Stay (HOSPITAL_COMMUNITY): Payer: Medicare HMO

## 2017-11-08 LAB — COMPREHENSIVE METABOLIC PANEL
ALBUMIN: 2.4 g/dL — AB (ref 3.5–5.0)
ALK PHOS: 144 U/L — AB (ref 38–126)
ALT: 21 U/L (ref 14–54)
AST: 34 U/L (ref 15–41)
Anion gap: 12 (ref 5–15)
BILIRUBIN TOTAL: 0.4 mg/dL (ref 0.3–1.2)
BUN: 69 mg/dL — AB (ref 6–20)
CALCIUM: 8.3 mg/dL — AB (ref 8.9–10.3)
CO2: 25 mmol/L (ref 22–32)
CREATININE: 3.87 mg/dL — AB (ref 0.44–1.00)
Chloride: 107 mmol/L (ref 101–111)
GFR calc Af Amer: 12 mL/min — ABNORMAL LOW (ref >60)
GFR calc non Af Amer: 10 mL/min — ABNORMAL LOW (ref >60)
GLUCOSE: 136 mg/dL — AB (ref 65–99)
Potassium: 4.2 mmol/L (ref 3.5–5.1)
Sodium: 144 mmol/L (ref 135–145)
TOTAL PROTEIN: 7.1 g/dL (ref 6.5–8.1)

## 2017-11-08 LAB — SEDIMENTATION RATE: Sed Rate: 118 mm/hr — ABNORMAL HIGH (ref 0–22)

## 2017-11-08 LAB — GLUCOSE, CAPILLARY
GLUCOSE-CAPILLARY: 117 mg/dL — AB (ref 65–99)
GLUCOSE-CAPILLARY: 146 mg/dL — AB (ref 65–99)
GLUCOSE-CAPILLARY: 149 mg/dL — AB (ref 65–99)
GLUCOSE-CAPILLARY: 150 mg/dL — AB (ref 65–99)
Glucose-Capillary: 126 mg/dL — ABNORMAL HIGH (ref 65–99)
Glucose-Capillary: 122 mg/dL — ABNORMAL HIGH (ref 65–99)
Glucose-Capillary: 153 mg/dL — ABNORMAL HIGH (ref 65–99)

## 2017-11-08 LAB — APTT
aPTT: 40 seconds — ABNORMAL HIGH (ref 24–36)
aPTT: 57 seconds — ABNORMAL HIGH (ref 24–36)

## 2017-11-08 LAB — CBC
HEMATOCRIT: 22.6 % — AB (ref 36.0–46.0)
HEMOGLOBIN: 7 g/dL — AB (ref 12.0–15.0)
MCH: 26.1 pg (ref 26.0–34.0)
MCHC: 31 g/dL (ref 30.0–36.0)
MCV: 84.3 fL (ref 78.0–100.0)
Platelets: 347 10*3/uL (ref 150–400)
RBC: 2.68 MIL/uL — ABNORMAL LOW (ref 3.87–5.11)
RDW: 19.5 % — ABNORMAL HIGH (ref 11.5–15.5)
WBC: 9.6 10*3/uL (ref 4.0–10.5)

## 2017-11-08 LAB — HEPARIN LEVEL (UNFRACTIONATED)
Heparin Unfractionated: 2.01 IU/mL — ABNORMAL HIGH (ref 0.30–0.70)
Heparin Unfractionated: >2.2 IU/mL — ABNORMAL HIGH (ref 0.30–0.70)

## 2017-11-08 LAB — CULTURE, BLOOD (ROUTINE X 2)
Culture: NO GROWTH
SPECIAL REQUESTS: ADEQUATE

## 2017-11-08 MED ORDER — HEPARIN (PORCINE) IN NACL 100-0.45 UNIT/ML-% IJ SOLN
1300.0000 [IU]/h | INTRAMUSCULAR | Status: DC
  Administered 2017-11-08: 1300 [IU]/h via INTRAVENOUS
  Filled 2017-11-08: qty 250

## 2017-11-08 MED ORDER — METHYLPREDNISOLONE SODIUM SUCC 125 MG IJ SOLR
60.0000 mg | Freq: Four times a day (QID) | INTRAMUSCULAR | Status: DC
  Administered 2017-11-08 – 2017-11-09 (×5): 60 mg via INTRAVENOUS
  Filled 2017-11-08 (×5): qty 2

## 2017-11-08 MED ORDER — HEPARIN (PORCINE) IN NACL 100-0.45 UNIT/ML-% IJ SOLN
1500.0000 [IU]/h | INTRAMUSCULAR | Status: DC
  Administered 2017-11-08 – 2017-11-09 (×2): 1500 [IU]/h via INTRAVENOUS
  Filled 2017-11-08: qty 250

## 2017-11-08 MED ORDER — FENTANYL CITRATE (PF) 100 MCG/2ML IJ SOLN
50.0000 ug | INTRAMUSCULAR | Status: AC | PRN
  Administered 2017-11-10 (×3): 50 ug via INTRAVENOUS
  Filled 2017-11-08 (×2): qty 2

## 2017-11-08 MED ORDER — FENTANYL CITRATE (PF) 100 MCG/2ML IJ SOLN
50.0000 ug | INTRAMUSCULAR | Status: DC | PRN
  Administered 2017-11-08 – 2017-11-10 (×4): 50 ug via INTRAVENOUS
  Filled 2017-11-08 (×2): qty 2

## 2017-11-08 MED ORDER — HEPARIN (PORCINE) IN NACL 100-0.45 UNIT/ML-% IJ SOLN: 1300.0000 [IU]/h | INTRAMUSCULAR | Status: DC

## 2017-11-08 MED ORDER — FENTANYL 2500MCG IN NS 250ML (10MCG/ML) PREMIX INFUSION
0.0000 ug/h | INTRAVENOUS | Status: DC
Start: 2017-11-08 — End: 2017-11-12
  Administered 2017-11-08 – 2017-11-09 (×3): 150 ug/h via INTRAVENOUS
  Administered 2017-11-10: 375 ug/h via INTRAVENOUS
  Administered 2017-11-10 – 2017-11-11 (×3): 325 ug/h via INTRAVENOUS
  Administered 2017-11-11: 375 ug/h via INTRAVENOUS
  Administered 2017-11-11 – 2017-11-12 (×2): 325 ug/h via INTRAVENOUS
  Filled 2017-11-08 (×12): qty 250

## 2017-11-08 NOTE — Consult Note (Signed)
   [facility] CM Inpatient Consult   11/08/2017  Jamie Mason 1941-02-05 [id number]    Telephone call received from [place] with [facility] ( outpatient home based palliative medicine program administered by [facility]) stating [facility] had a home visit scheduled for enrollment with patient today but noted she is in the hospital on the vent. States [facility] received referral from Dr. Rogelia Boga previously.  Spoke with inpatient RNCM to make aware that [facility] was following for potential palliative/hospice services as well.  Will continue to follow.    Raiford Noble, MSN-Ed, RN,BSN [facility] Liaison [phone]

## 2017-11-08 NOTE — Progress Notes (Signed)
ANTICOAGULATION CONSULT NOTE   Pharmacy Consult for Eliquis --> heparin drip Indication: atrial fibrillation  Allergies  Allergen Reactions  . Lisinopril Swelling    Tongue swelling   . Ace Inhibitors Other (See Comments)    unknown  . Codeine Itching and Rash  . Penicillins Itching and Rash    Has patient had a PCN reaction causing immediate rash, facial/tongue/throat swelling, SOB or lightheadedness with hypotension: unknown Has patient had a PCN reaction causing severe rash involving mucus membranes or skin necrosis: unknown Has patient had a PCN reaction that required hospitalization: no Has patient had a PCN reaction occurring within the last 10 years: no If all of the above answers are "NO", then may proceed with Cephalosporin use. TOLERATES CEPHALOSPORINS  . Tomato     Itchy rash    Patient Measurements: Height: 5' 5.5" (166.4 cm) Weight: (!) 344 lb 9.3 oz (156.3 kg) IBW/kg (Calculated) : 58.15 Heparin Dosing Weight: 92 kg  Vital Signs: Temp: 97.7 F (36.5 C) (05/29 0900) Temp Source: Oral (05/29 0900) BP: 137/47 (05/29 0900) Pulse Rate: 53 (05/29 0900)  Labs: Recent Labs    11/06/17 0342 11/07/17 0343 11/08/17 0313  HGB 7.3* 7.1* 7.0*  HCT 23.3* 23.6* 22.6*  PLT 315 326 347  CREATININE 2.79* 3.36* 3.87*    Estimated Creatinine Clearance: 19.3 mL/min (A) (by C-G formula based on SCr of 3.87 mg/dL (H)).   Medications:  PTA: eliquis 5 mg bid  Assessment: Patent is a 77 y.o F with hx afib on Eliquis PTA presented to the ED on 5/23 with c/o SOB.  Eliquis was resumed on admission.  With worsening of renal function, to transition to heparin drip on 5/29.  Today, 11/08/2017: - scr trending up 3.87 (crcl~16N) - hgb low but stable - plts ok - no bleeding documented   Goal of Therapy:  Heparin level 0.3-0.7 units/ml Monitor platelets by anticoagulation protocol: Yes   Plan:  - d/c Eliquis - start heparin drip at 1300 units/hr (no bolus) - baseline  aPTT and heparin level - check 8 hr heparin and aPTT - monitor for s/s bleeding  Bowen Goyal P 11/08/2017,9:33 AM

## 2017-11-08 NOTE — Progress Notes (Signed)
PULMONARY / CRITICAL CARE MEDICINE   Name: Jamie Mason MRN: [id number] DOB: [birth year]    ADMISSION DATE:  11/02/2017  REFERRING MD:  Dr. Anitra Lauth  CHIEF COMPLAINT:  Short of breath  HISTORY OF PRESENT ILLNESS:   Hx from chart and ER staff.  77 y/o female former smoker presented to ER 5/23 with c/o leg pains for 5 days (non-ambulatory due to chronic pain/swelling).  She was to be discharged from ER after meeting with social worker.  However, in ER she became short of breath and started on Bipap. She was noted to have wheezing and CXR showed pulmonary edema.  She was given neb tx and lasix.  She become more lethargic.  ABG showed respiratory acidosis.  There was concern for pneumonia also.  She was intubated in ER and started on ABx.  SUBJECTIVE:  Propofol remains off, weaning fentanyl drip as she becomes apneic on pressure support ventilation  VITAL SIGNS: Blood Pressure (Abnormal) 137/50 (BP Location: Left Arm)   Pulse (Abnormal) 48   Temperature 97.9 F (36.6 C)   Respiration (Abnormal) 8   Height 5' 5.5" (1.664 m)   Weight (Abnormal) 344 lb 9.3 oz (156.3 kg)   Oxygen Saturation 96%   Body Mass Index 56.47 kg/m   VENTILATOR SETTINGS: Vent Mode: PSV;CPAP FiO2 (%):  [30 %-40 %] 30 % Set Rate:  [18 bmp] 18 bmp Vt Set:  [470 mL] 470 mL PEEP:  [5 cmH20] 5 cmH20 Pressure Support:  [10 cmH20] 10 cmH20 Plateau Pressure:  [21 cmH20-28 cmH20] 23 cmH20  INTAKE / OUTPUT:  Intake/Output Summary (Last 24 hours) at 11/08/2017 0859 Last data filed at 11/08/2017 4098 Gross per 24 hour  Intake 2107.07 ml  Output 212 ml  Net 1895.07 ml     PHYSICAL EXAMINATION: General: This is a 77 year old chronically ill appearing female she is currently on pressure support ventilation HEENT orally intubated mucous membranes moist sclera nonicteric no obvious jugular venous distention Pulmonary: Some scattered rhonchi.  Tidal volumes in the 400 to 600 cc range on pressure support of 10 cmH2O  no accessory use observed Cardiac: Regular rate and rhythm Abdomen: Obese, soft, nontender, no organomegaly, positive bowel sounds GU: Minimal urine output Neuro: Agitated at times briskly localizes but does not follow commands  LABS: BMET Recent Labs  Lab 11/06/17 0342 11/07/17 0343 11/08/17 0313  NA 143 143 144  K 4.3 4.5 4.2  CL 105 107 107  CO2 BUN 52* 63* 69*  CREATININE 2.79* 3.36* 3.87*  GLUCOSE 120* 132* 136*    Electrolytes Recent Labs  Lab 11/05/17 0345 11/06/17 0342 11/07/17 0343 11/08/17 0313  CALCIUM 8.3* 8.3* 8.1* 8.3*  MG 1.9 2.0 2.0  --   PHOS 3.3 4.0 4.9*  --     CBC Recent Labs  Lab 11/06/17 0342 11/07/17 0343 11/08/17 0313  WBC 13.3* 9.9 9.6  HGB 7.3* 7.1* 7.0*  HCT 23.3* 23.6* 22.6*  PLT 315 326 347    Coag's No results for input(s): APTT, INR in the last 168 hours.  Sepsis Markers Recent Labs  Lab 11/03/17 0312 11/04/17 0527 11/05/17 0345  PROCALCITON 0.82 1.30 1.13    ABG Recent Labs  Lab 11/03/17 1000 11/05/17 0400 11/07/17 0350  PHART 7.294* 7.440 7.302*  PCO2ART 61.8* 43.0 55.0*  PO2ART 66.1* 73.9* 73.0*    Liver Enzymes Recent Labs  Lab 11/03/17 0312 11/04/17 0527 11/08/17 0313  AST 27 27 34  ALT 10* 13* 21  ALKPHOS 76  66 144*  BILITOT 0.6 0.4 0.4  ALBUMIN 2.8* 2.6* 2.4*    Cardiac Enzymes No results for input(s): TROPONINI, PROBNP in the last 168 hours.  Glucose Recent Labs  Lab 11/07/17 0742 11/07/17 1607 11/07/17 1946 11/08/17 0001 11/08/17 0413 11/08/17 0756  GLUCAP 108* 111* 136* 117* 126* 122*    Imaging Chest x-ray 5/25-ET tube in stable position.  More focal density in the right upper lobe concerning for pneumonia.   Chest x-ray 5/26-right upper lobe consolidation has improved.  ET tube in stable pressure Chest x-ray 5/27-persistent right lung opacity.  ET tube in good position. I reviewed the images personally.  STUDIES:  PFT 04/10/14 >> FEV1 1.50 (87%), FEV1% 77, TLC  4.54 (92%), DLCO 56% LHC 07/11/16 >> chronic occlusion mid RCA with collaterals, mod distal Lt MCA, mid and distal LAD, ostial Lt Cx, and OM2 Echo 09/17/17 >> mild LVH, EF 60 to 65%, grade 2 DD, mild LA dilation, mod RV dilation with systolic dysfx, PAS 51 mmHg  CULTURES: Blood 5/23 >> gram-positive rods Sputum 5/23 >>  ANTIBIOTICS: Azactam 5/23 >> 5/23 Vancomycin 5/23 >> 5/24 Cefepime 5/23 >>  Zyvox 5/25 >>  SIGNIFICANT EVENTS: 5/23 Admit  LINES/TUBES: ETT 5/23  DISCUSSION: 77 yo female with acute on chronic hypoxic/hypercapnic respiratory failure likely from acute pulmonary edema +/- pneumonia.  She has hx of CAD, A flutter on eliquis, HFpEF, HTN, DM. She remains quite positive from a volume status standpoint.  Renal function continues to decline.  She has had 7 days of treatment for her pneumonia no organism specified.  We have aggressively diuresed her however urine output not improving.  I do not think she is a dialysis candidate based on her underlying limited mobility.  She had very limited radiographic change in spite of antibiotics and diuresis.  Although her amiodarone has been discontinued would also wonder about the possibility of amiodarone related lung toxicity.  We will check a sed rate.  And discuss further with team, although not a dialysis candidate wonder if we should consider high-resolution CT scan and possibly steroid trial prior to transitioning to but will likely be palliative care.  We have a meeting scheduled with the family on 5/30  ASSESSMENT / PLAN:  Acute on chronic respiratory failure secondary to acute heart failure w/ pulmonary edema +/- PNA -urine antigens negative -Remains +11.4 L -PCXR personally reviewed: Support tubes and lines are in satisfactory position right greater than left airspace disease without significant change -Also wonder about risk of amiodarone toxicity in this patient Plan Decrease sedation, RASS goal 0, will stop fentanyl drip and  change to as needed.  If needed we could consider Precedex however her heart rate may be over limiting factor here The #7 antibiotics total, I think we can complete at 8 days given no organisms identified Continue IV diuresis We have already stopped amiodarone Check sed rate, I will discuss with Dr. Kendrick Fries Re: Steroid trial  1 of 2 Corynebacterium blood cultures. Likely contamination RID negative Plan Follow-up blood culture    History of coronary artery disease, hypertension, a flutter, HFpEF -Off amiodarone, Coreg as she remains bradycardic Plan Cont tele Cont Eliquis, asa and lipitor  Lasix as above  Acute kidney injury -cr climbing. Volume overloaded.  -as BP to diurese; takes  bid at home.  Plan Continue Lasix 120 twice daily Continue strict I&O Renal dose medications  Acute metabolic encephalopathy. Plan Stop fentanyl drip RASS goal 0 PRN fentanyl Precedex if needed  Diabetes Glycemic  control is acceptable  Plan Sliding scale insulin Anemia -no evidence of bleeding hgb 7.1 Plan Trend CBC  DVT prophylaxis -Eliquis, SCDs SUP -Protonix Nutrition -tube feeds  Goals of care - full code >> she was listed as DNI during last admission in April 2019.  Discussed with family at this visit.  She is now do not resuscitate. Family is okay with short-term vent support.  Simonne Martinet ACNP-BC [facility] Pulmonary/Critical Care Pager # [phone] OR # [phone] if no answer

## 2017-11-08 NOTE — Progress Notes (Signed)
Pt's secretions out of mouth and nose is milky white and not clear like earlier this am. Tube feeds stopped at this time. Mouthcare and suction completed. Bed at 30 degrees

## 2017-11-08 NOTE — Progress Notes (Signed)
Spoke with patients daughter this evening, family is aware of meeting in the morning, and request that staff does not replace OJ tube or restart tube feeding> Family also request that patient not be turned tonight.

## 2017-11-08 NOTE — Progress Notes (Signed)
Attempted to bathe patient. While patient was being turned oxygen saturation quickly dropped into 80's. 100% O2 breath administered, no changes. Upon observation, pt not breathing. Sats continued to drop into 20's. 1006 patient went asystole. Pulled patient from pressure-support vent and provided breaths via ambu-bag at 15L O2. Heart rate resumed 1011. Sats back up to 90's. Critical Care MDs at bedside. Instructed to place patient back on PRVC. Family at bedside, MD discussing goals of care. Pt appears to be stable at this time. Will continue to closely monitor.

## 2017-11-08 NOTE — Progress Notes (Signed)
ANTICOAGULATION CONSULT NOTE   Pharmacy Consult for Eliquis --> heparin drip Indication: atrial fibrillation  Allergies  Allergen Reactions  . Lisinopril Swelling    Tongue swelling   . Ace Inhibitors Other (See Comments)    unknown  . Codeine Itching and Rash  . Penicillins Itching and Rash    Has patient had a PCN reaction causing immediate rash, facial/tongue/throat swelling, SOB or lightheadedness with hypotension: unknown Has patient had a PCN reaction causing severe rash involving mucus membranes or skin necrosis: unknown Has patient had a PCN reaction that required hospitalization: no Has patient had a PCN reaction occurring within the last 10 years: no If all of the above answers are "NO", then may proceed with Cephalosporin use. TOLERATES CEPHALOSPORINS  . Tomato     Itchy rash    Patient Measurements: Height: 5' 5.5" (166.4 cm) Weight: (!) 344 lb 9.3 oz (156.3 kg) IBW/kg (Calculated) : 58.15 Heparin Dosing Weight: 92 kg  Vital Signs: Temp: 98.6 F (37 C) (05/29 2000) Temp Source: Bladder (05/29 1800) BP: 160/49 (05/29 2000) Pulse Rate: 50 (05/29 2000)  Labs: Recent Labs    11/06/17 0342 11/07/17 0343 11/08/17 0313 11/08/17 0956 11/08/17 1945  HGB 7.3* 7.1* 7.0*  --   --   HCT 23.3* 23.6* 22.6*  --   --   PLT 315 326 347  --   --   APTT  --   --   --  40* 57*  HEPARINUNFRC  --   --   --  2.01*  --   CREATININE 2.79* 3.36* 3.87*  --   --     Estimated Creatinine Clearance: 19.3 mL/min (A) (by C-G formula based on SCr of 3.87 mg/dL (H)).   Medications:  PTA: eliquis 5 mg bid  Assessment: Patent is a 77 y.o F with hx afib on Eliquis PTA presented to the ED on 5/23 with c/o SOB.  Eliquis was resumed on admission.  With worsening of renal function, to transition to heparin drip on 5/29.  Last dose apixaban 5/28 22:27 5/59 baseline aPTT = 40 sec, anti-Xa level = 2.01 (elevated as would expect with recent apixaban use)   Today, 11/08/2017: - initial  aPTT subtherapeutic at 57 sec - scr trending up 3.87 (crcl~16N) - hgb low but stable - plts ok - no bleeding documented  Goal of Therapy:  Heparin level 0.3-0.7 units/ml  APTT 66-102 sec Monitor platelets by anticoagulation protocol: Yes   Plan:  - Increase heparin drip to 1500 units/hr (no bolus) - dose per aPTT until effects of apixaban cleared - check 8 hr aPTT - monitor for s/s bleeding  Juliette Alcide, PharmD, BCPS.   Pager: [phone] 11/08/2017 8:40 PM

## 2017-11-09 DIAGNOSIS — N179 Acute kidney failure, unspecified: Secondary | ICD-10-CM

## 2017-11-09 LAB — CBC
HEMATOCRIT: 23.5 % — AB (ref 36.0–46.0)
Hemoglobin: 7.3 g/dL — ABNORMAL LOW (ref 12.0–15.0)
MCH: 26.5 pg (ref 26.0–34.0)
MCHC: 31.1 g/dL (ref 30.0–36.0)
MCV: 85.5 fL (ref 78.0–100.0)
Platelets: 385 10*3/uL (ref 150–400)
RBC: 2.75 MIL/uL — AB (ref 3.87–5.11)
RDW: 19.3 % — AB (ref 11.5–15.5)
WBC: 13.4 10*3/uL — AB (ref 4.0–10.5)

## 2017-11-09 LAB — GLUCOSE, CAPILLARY
GLUCOSE-CAPILLARY: 151 mg/dL — AB (ref 65–99)
Glucose-Capillary: 160 mg/dL — ABNORMAL HIGH (ref 65–99)

## 2017-11-09 LAB — APTT: aPTT: 110 seconds — ABNORMAL HIGH (ref 24–36)

## 2017-11-09 MED ORDER — HEPARIN (PORCINE) IN NACL 100-0.45 UNIT/ML-% IJ SOLN: 1400.0000 [IU]/h | INTRAMUSCULAR | Status: DC

## 2017-11-09 NOTE — Progress Notes (Signed)
Family at bedside, requesting for patient to be extubated now. MD notified, orders placed, respiratory called to extubate. OG, subglottic suction, and ET tube removed. Thick, tan mucus suctioned, restraints removed. Pt resting comfortably on fentanyl drip at this time. Will continue to monitor.

## 2017-11-09 NOTE — Consult Note (Signed)
   [facility] CM Inpatient Consult   11/09/2017  BLESSEN KIMBROUGH [birth year] [id number]     [facility] Care Management follow up.  Chart reviewed. Noted Mrs. Hino is under comfort measures.  Will update [facility] team.   Raiford Noble, MSN-Ed, RN,BSN [facility] Liaison [phone]

## 2017-11-09 NOTE — Progress Notes (Signed)
ANTICOAGULATION CONSULT NOTE   Pharmacy Consult for Eliquis --> heparin drip Indication: atrial fibrillation  Allergies  Allergen Reactions  . Lisinopril Swelling    Tongue swelling   . Ace Inhibitors Other (See Comments)    unknown  . Codeine Itching and Rash  . Penicillins Itching and Rash    Has patient had a PCN reaction causing immediate rash, facial/tongue/throat swelling, SOB or lightheadedness with hypotension: unknown Has patient had a PCN reaction causing severe rash involving mucus membranes or skin necrosis: unknown Has patient had a PCN reaction that required hospitalization: no Has patient had a PCN reaction occurring within the last 10 years: no If all of the above answers are "NO", then may proceed with Cephalosporin use. TOLERATES CEPHALOSPORINS  . Tomato     Itchy rash    Patient Measurements: Height: 5' 5.5" (166.4 cm) Weight: (!) 337 lb 11.9 oz (153.2 kg) IBW/kg (Calculated) : 58.15 Heparin Dosing Weight: 92 kg  Vital Signs: Temp: 97.9 F (36.6 C) (05/30 0744) BP: 148/55 (05/30 0600) Pulse Rate: 45 (05/30 0744)  Labs: Recent Labs    11/07/17 0343 11/08/17 0313 11/08/17 0956 11/08/17 1945 11/09/17 0717  HGB 7.1* 7.0*  --   --  7.3*  HCT 23.6* 22.6*  --   --  23.5*  PLT 326 347  --   --  385  APTT  --   --  40* 57* 110*  HEPARINUNFRC  --   --  2.01* >2.20*  --   CREATININE 3.36* 3.87*  --   --   --     Estimated Creatinine Clearance: 19.1 mL/min (A) (by C-G formula based on SCr of 3.87 mg/dL (H)).   Medications:  PTA: eliquis 5 mg bid  Assessment: Patent is a 77 y.o F with hx afib on Eliquis PTA presented to the ED on 5/23 with c/o SOB.  Eliquis was resumed on admission.  With worsening of renal function, to transition to heparin drip on 5/29.  Last dose apixaban 5/28 22:27 5/59 baseline aPTT = 40 sec, anti-Xa level = 2.01 (elevated as would expect with recent apixaban use)   Today, 11/09/2017: - aPTT SUPRAtherapeutic at 110 sec - scr  elevated yesterday 3.87 (crcl~16N) - hgb low but stable, 7.3 - plts ok - no bleeding documented  Goal of Therapy:  Heparin level 0.3-0.7 units/ml  APTT 66-102 sec Monitor platelets by anticoagulation protocol: Yes   Plan:  - decrease heparin drip to 1400 units/hr  - dose per aPTT until effects of apixaban cleared - check 8 hr aPTT - monitor for s/s bleeding  Loralee Pacas, PharmD, BCPS Pager: [phone] 11/09/2017 8:20 AM

## 2017-11-09 NOTE — Progress Notes (Signed)
PULMONARY / CRITICAL CARE MEDICINE   Name: Jamie Mason MRN: [id number] DOB: April 29, 1941    ADMISSION DATE:  11/02/2017  REFERRING MD:  Dr. Anitra Lauth  CHIEF COMPLAINT:  Short of breath  HISTORY OF PRESENT ILLNESS:   Hx from chart and ER staff.  77 y/o female former smoker presented to ER 5/23 with c/o leg pains for 5 days (non-ambulatory due to chronic pain/swelling).  She was to be discharged from ER after meeting with social worker.  However, in ER she became short of breath and started on Bipap. She was noted to have wheezing and CXR showed pulmonary edema.  She was given neb tx and lasix.  She become more lethargic.  ABG showed respiratory acidosis.  There was concern for pneumonia also.  She was intubated in ER and started on ABx.  SUBJECTIVE:  Decision made to transition to comfort last night.  No issues overnight.  Appears comfortable on fentanyl infusion VITAL SIGNS: Blood Pressure (Abnormal) 148/55   Pulse (Abnormal) 45   Temperature 97.9 F (36.6 C)   Respiration 11   Height 5' 5.5" (1.664 m)   Weight (Abnormal) 337 lb 11.9 oz (153.2 kg)   Oxygen Saturation 97%   Body Mass Index 55.35 kg/m   VENTILATOR SETTINGS: Vent Mode: PRVC FiO2 (%):  [30 %] 30 % Set Rate:  [18 bmp] 18 bmp Vt Set:  [470 mL] 470 mL PEEP:  [5 cmH20] 5 cmH20 Plateau Pressure:  [26 cmH20-31 cmH20] 26 cmH20  INTAKE / OUTPUT:  Intake/Output Summary (Last 24 hours) at 11/09/2017 0817 Last data filed at 11/09/2017 0600 Gross per 24 hour  Intake 1821.49 ml  Output 205 ml  Net 1616.49 ml     PHYSICAL EXAMINATION: General: This is a sedated 77 year old female lying on full ventilator support HEENT normocephalic atraumatic orally intubated no jugular venous distention Pulmonary: Scattered rhonchi equal chest rise on mechanically assisted breath Cardiac: Regular rate and rhythm Abdomen: Soft nontender Extremity: Chronic lower extremity edema strong pulses warm. Neuro: Sedated on  ventilator  LABS: BMET Recent Labs  Lab 11/06/17 0342 11/07/17 0343 11/08/17 0313  NA 143 143 144  K 4.3 4.5 4.2  CL 105 107 107  CO2 BUN 52* 63* 69*  CREATININE 2.79* 3.36* 3.87*  GLUCOSE 120* 132* 136*    Electrolytes Recent Labs  Lab 11/05/17 0345 11/06/17 0342 11/07/17 0343 11/08/17 0313  CALCIUM 8.3* 8.3* 8.1* 8.3*  MG 1.9 2.0 2.0  --   PHOS 3.3 4.0 4.9*  --     CBC Recent Labs  Lab 11/07/17 0343 11/08/17 0313 11/09/17 0717  WBC 9.9 9.6 13.4*  HGB 7.1* 7.0* 7.3*  HCT 23.6* 22.6* 23.5*  PLT 326 347 385    Coag's Recent Labs  Lab 11/08/17 0956 11/08/17 1945 11/09/17 0717  APTT 40* 57* 110*    Sepsis Markers Recent Labs  Lab 11/03/17 0312 11/04/17 0527 11/05/17 0345  PROCALCITON 0.82 1.30 1.13    ABG Recent Labs  Lab 11/03/17 1000 11/05/17 0400 11/07/17 0350  PHART 7.294* 7.440 7.302*  PCO2ART 61.8* 43.0 55.0*  PO2ART 66.1* 73.9* 73.0*    Liver Enzymes Recent Labs  Lab 11/03/17 0312 11/04/17 0527 11/08/17 0313  AST 27 27 34  ALT 10* 13* 21  ALKPHOS 76 66 144*  BILITOT 0.6 0.4 0.4  ALBUMIN 2.8* 2.6* 2.4*    Cardiac Enzymes No results for input(s): TROPONINI, PROBNP in the last 168 hours.  Glucose Recent Labs  Lab 11/08/17  1142 11/08/17 1622 11/08/17 2003 11/08/17 2347 11/09/17 0422 11/09/17 0746  GLUCAP 149* 153* 146* 150* 160* 151*    Imaging Chest x-ray 5/25-ET tube in stable position.  More focal density in the right upper lobe concerning for pneumonia.   Chest x-ray 5/26-right upper lobe consolidation has improved.  ET tube in stable pressure Chest x-ray 5/27-persistent right lung opacity.  ET tube in good position. I reviewed the images personally.  STUDIES:  PFT 04/10/14 >> FEV1 1.50 (87%), FEV1% 77, TLC 4.54 (92%), DLCO 56% LHC 07/11/16 >> chronic occlusion mid RCA with collaterals, mod distal Lt MCA, mid and distal LAD, ostial Lt Cx, and OM2 Echo 09/17/17 >> mild LVH, EF 60 to 65%, grade 2 DD,  mild LA dilation, mod RV dilation with systolic dysfx, PAS 51 mmHg  CULTURES: Blood 5/23 >> gram-positive rods Sputum 5/23 >>  ANTIBIOTICS: Azactam 5/23 >> 5/23 Vancomycin 5/23 >> 5/24 Cefepime 5/23 >>  Zyvox 5/25 >>  SIGNIFICANT EVENTS: 5/23 Admit  LINES/TUBES: ETT 5/23    ASSESSMENT / PLAN:  Acute on chronic respiratory failure secondary to acute heart failure w/ pulmonary edema +/- PNA 1 of 2 Corynebacterium blood cultures. Likely contamination History of coronary artery disease, hypertension, a flutter, HFpEF Acute kidney injury Acute metabolic encephalopathy. Diabetes Anemia  DISCUSSION: 77 yo female with acute on chronic hypoxic/hypercapnic respiratory failure likely from acute pulmonary edema +/- pneumonia.  She has hx of CAD, A flutter on eliquis, HFpEF, HTN, DM. Near cardiac arrest yesterday on 5/29 as a consequence of acidosis and hypoxia (d/t worsening renal fxn and volume overload). Prior to this we started solumedrol as he had made no improvement w/ 7 days broad spec abx and aggressive diuresis, raising the concern could we also be dealing w/ drug induced pneumonitis d/t amiodarone. During the bradycardic/hypoxic event we spoke at length with the patients family. We discussed our fear that she would not tolerate Dialysis given her critical state and given her chronic illness would be a poor long term HD candidate. We also discussed the failure of our current treatment to improve her clinical condition. As she was already significantly debilitated and the hope for a reasonable functional recovery was essentially nonexistent the family had opted to transition to comfort, with plan to w/d life support today (5/30).  She was placed back on fentanyl gtt.  -She appears comfortable as of 5/30.  Family is at bedside.  Anticipating withdrawal later today.  Plan Continue fentanyl drip Discontinue all therapy that does not directly contribute to comfort Anticipate extubation  later this morning when the family arrives  DVT prophylaxis -scds SUP -Protonix Nutrition -tube feeds  Simonne Martinet ACNP-BC [facility] Pulmonary/Critical Care Pager # [phone] OR # [phone] if no answer

## 2017-11-10 ENCOUNTER — Other Ambulatory Visit: Payer: Self-pay | Admitting: *Deleted

## 2017-11-10 LAB — GLUCOSE, CAPILLARY: Glucose-Capillary: 138 mg/dL — ABNORMAL HIGH (ref 65–99)

## 2017-11-10 NOTE — Progress Notes (Signed)
   11/10/17 1500  Clinical Encounter Type  Visited With Family  Visit Type Initial;Psychological support;Spiritual support;Patient actively dying  Referral From Nurse  Consult/Referral To Chaplain  Spiritual Encounters  Spiritual Needs Emotional;Other (Comment);Grief support (Spiritual Care Conversation/Support)  Stress Factors  Patient Stress Factors Not reviewed  Family Stress Factors Loss   I visited with the patient's daughter who was present at the bedside. She states that she is going through grief over her mother's condition, but is doing fine at this point.   Please, contact Spiritual Care for further assistance.   Chaplain Clint BolderBrittany Agustina Witzke M.Div., [facility]

## 2017-11-10 NOTE — Plan of Care (Signed)
  Problem: Health Behavior/Discharge Planning: Goal: Ability to manage health-related needs will improve Outcome: Not Progressing   Problem: Clinical Measurements: Goal: Ability to maintain clinical measurements within normal limits will improve Outcome: Not Progressing Goal: Will remain free from infection Outcome: Not Progressing Goal: Diagnostic test results will improve Outcome: Not Progressing Goal: Respiratory complications will improve Outcome: Not Progressing   Problem: Coping: Goal: Level of anxiety will decrease Outcome: Not Applicable

## 2017-11-10 NOTE — Progress Notes (Signed)
PULMONARY / CRITICAL CARE MEDICINE   Name: ULA COUVILLON MRN: [id number] DOB: January 16, 1941    ADMISSION DATE:  11/02/2017  REFERRING MD:  Dr. Anitra Lauth  CHIEF COMPLAINT:  Short of breath  HISTORY OF PRESENT ILLNESS:   Hx from chart and ER staff.  77 y/o female former smoker presented to ER 5/23 with c/o leg pains for 5 days (non-ambulatory due to chronic pain/swelling).  She was to be discharged from ER after meeting with social worker.  However, in ER she became short of breath and started on Bipap. She was noted to have wheezing and CXR showed pulmonary edema.  She was given neb tx and lasix.  She become more lethargic.  ABG showed respiratory acidosis.  There was concern for pneumonia also.  She was intubated in ER and started on ABx.  SUBJECTIVE:  Unresponsive, does appear comfortable currently  VITAL SIGNS: Blood Pressure (Abnormal) 151/54 (BP Location: Left Arm)   Pulse (Abnormal) 36   Temperature (Abnormal) 96.8 F (36 C)   Respiration 11   Height 5' 5.5" (1.664 m)   Weight (Abnormal) 337 lb 11.9 oz (153.2 kg)   Oxygen Saturation 96%   Body Mass Index 55.35 kg/m   VENTILATOR SETTINGS: FiO2 (%):  [30 %] 30 %  INTAKE / OUTPUT:  Intake/Output Summary (Last 24 hours) at 11/10/2017 0915 Last data filed at 11/10/2017 0800 Gross per 24 hour  Intake 552.5 ml  Output 155 ml  Net 397.5 ml     PHYSICAL EXAMINATION: General: 77 year old female currently unresponsive on fentanyl infusion HEENT normocephalic atraumatic mucous membranes moist Pulmonary: Some rhonchorous breath sounds, intermittent episodes of apnea with prolonged expiratory phase Cardiac: Regular rate and rhythm Abdomen: Soft nontender Extremities: Brisk cap refill currently warm, strong pulses Neuro: Unresponsive  LABS: BMET Recent Labs  Lab 11/06/17 0342 11/07/17 0343 11/08/17 0313  NA 143 143 144  K 4.3 4.5 4.2  CL 105 107 107  CO2 25 24 25   BUN 52* 63* 69*  CREATININE 2.79* 3.36* 3.87*   GLUCOSE 120* 132* 136*    Electrolytes Recent Labs  Lab 11/05/17 0345 11/06/17 0342 11/07/17 0343 11/08/17 0313  CALCIUM 8.3* 8.3* 8.1* 8.3*  MG 1.9 2.0 2.0  --   PHOS 3.3 4.0 4.9*  --     CBC Recent Labs  Lab 11/07/17 0343 11/08/17 0313 11/09/17 0717  WBC 9.9 9.6 13.4*  HGB 7.1* 7.0* 7.3*  HCT 23.6* 22.6* 23.5*  PLT 326 347 385    Coag's Recent Labs  Lab 11/08/17 0956 11/08/17 1945 11/09/17 0717  APTT 40* 57* 110*    Sepsis Markers Recent Labs  Lab 11/04/17 0527 11/05/17 0345  PROCALCITON 1.30 1.13    ABG Recent Labs  Lab 11/03/17 1000 11/05/17 0400 11/07/17 0350  PHART 7.294* 7.440 7.302*  PCO2ART 61.8* 43.0 55.0*  PO2ART 66.1* 73.9* 73.0*    Liver Enzymes Recent Labs  Lab 11/04/17 0527 11/08/17 0313  AST 27 34  ALT 13* 21  ALKPHOS 66 144*  BILITOT 0.4 0.4  ALBUMIN 2.6* 2.4*    Cardiac Enzymes No results for input(s): TROPONINI, PROBNP in the last 168 hours.  Glucose Recent Labs  Lab 11/08/17 1142 11/08/17 1622 11/08/17 2003 11/08/17 2347 11/09/17 0422 11/09/17 0746  GLUCAP 149* 153* 146* 150* 160* 151*    Imaging Chest x-ray 5/25-ET tube in stable position.  More focal density in the right upper lobe concerning for pneumonia.   Chest x-ray 5/26-right upper lobe consolidation has improved.  ET  tube in stable pressure Chest x-ray 5/27-persistent right lung opacity.  ET tube in good position. I reviewed the images personally.  STUDIES:  PFT 04/10/14 >> FEV1 1.50 (87%), FEV1% 77, TLC 4.54 (92%), DLCO 56% LHC 07/11/16 >> chronic occlusion mid RCA with collaterals, mod distal Lt MCA, mid and distal LAD, ostial Lt Cx, and OM2 Echo 09/17/17 >> mild LVH, EF 60 to 65%, grade 2 DD, mild LA dilation, mod RV dilation with systolic dysfx, PAS 51 mmHg  CULTURES: Blood 5/23 >> gram-positive rods Sputum 5/23 >>  ANTIBIOTICS: Azactam 5/23 >> 5/23 Vancomycin 5/23 >> 5/24 Cefepime 5/23 >>  Zyvox 5/25 >>  SIGNIFICANT EVENTS: 5/23  Admit  LINES/TUBES: ETT 5/23    ASSESSMENT / PLAN:  Acute on chronic respiratory failure secondary to acute heart failure w/ pulmonary edema +/- PNA 1 of 2 Corynebacterium blood cultures. Likely contamination History of coronary artery disease, hypertension, a flutter, HFpEF Acute kidney injury Acute metabolic encephalopathy. Diabetes Anemia  Interval from 5/30 to 5/31  Transition to comfort on 5/30.  We extubated her yesterday on 5/30.  She has not responded to 7 days of antibiotics, high-dose diuretics or steroid trial.  Some medication adjustments were required last night to obtain reasonable comfort she appears comfortable currently on fentanyl infusion with periods of prolonged apnea noted  Plan Continue fentanyl drip Continue comfort oriented care  Simonne MartinetPeter E Marlin Jarrard ACNP-BC [facility] Pulmonary/Critical Care Pager # [phone] OR # [phone] if no answer

## 2017-11-10 NOTE — Progress Notes (Signed)
Pt agitated, restless, and moaning. E-Link contacted. Provider increased fentanyl dosage for comfort.

## 2017-11-10 NOTE — Patient Outreach (Signed)
[facility]) Care Management  11/10/2017  RYEN HEITMEYER 12/18/1940 [id number]   Notified by hospital liaison member is now extubated and on comfort measures.  This care manager will close case at this time.  [name], [place], MSN [facility] Care Management  [facility] Manager [phone]

## 2017-11-10 NOTE — Progress Notes (Addendum)
Plan: Comfort Care CSW following for SW needs.   Vivi Barrack, Theresia Majors, MSW Clinical Social Worker  [phone] 11/10/2017  8:21 AM

## 2017-11-11 DIAGNOSIS — N179 Acute kidney failure, unspecified: Secondary | ICD-10-CM

## 2017-11-11 MED ORDER — CHLORHEXIDINE GLUCONATE 0.12 % MT SOLN
OROMUCOSAL | Status: AC
  Administered 2017-11-11: 15 mL via OROMUCOSAL
  Filled 2017-11-11: qty 15

## 2017-11-11 NOTE — Progress Notes (Signed)
PULMONARY / CRITICAL CARE MEDICINE   Name: Jamie Mason MRN: [id number] DOB: 06/14/1940    ADMISSION DATE:  11/02/2017  REFERRING MD:  Dr. Anitra LauthPlunkett  CHIEF COMPLAINT:  Short of breath  brief [age] y/o female former smoker presented to ER 5/23 with c/o leg pains for 5 days (non-ambulatory due to chronic pain/swelling).  She was to be discharged from ER after meeting with social worker.  However, in ER she became short of breath and started on Bipap. She was noted to have wheezing and CXR showed pulmonary edema.  She was given neb tx and lasix.  She become more lethargic.  ABG showed respiratory acidosis.  There was concern for pneumonia also.  She was intubated in ER and started on ABx.   Imaging Chest x-ray 5/25-ET tube in stable position.  More focal density in the right upper lobe concerning for pneumonia.   Chest x-ray 5/26-right upper lobe consolidation has improved.  ET tube in stable pressure Chest x-ray 5/27-persistent right lung opacity.  ET tube in good position. I reviewed the images personally.  STUDIES:  PFT 04/10/14 >> FEV1 1.50 (87%), FEV1% 77, TLC 4.54 (92%), DLCO 56% LHC 07/11/16 >> chronic occlusion mid RCA with collaterals, mod distal Lt MCA, mid and distal LAD, ostial Lt Cx, and OM2 Echo 09/17/17 >> mild LVH, EF 60 to 65%, grade 2 DD, mild LA dilation, mod RV dilation with systolic dysfx, PAS 51 mmHg  CULTURES: Blood 5/23 >> gram-positive rods Sputum 5/23 >>  ANTIBIOTICS: Azactam 5/23 >> 5/23 Vancomycin 5/23 >> 5/24 Cefepime 5/23 >>  Zyvox 5/25 >>  SIGNIFICANT EVENTS: 5/23 Admit  LINES/TUBES: ETT 5/23 5/30 extubated and transitioned to comfort 5/31 - Unresponsive, does appear comfortable currently   SUBJECTIVE/OVERNIGHT/INTERVAL HX  11/11/16 - [facility] On fent gtt 375mcg. Per RN - not much change. Believes is actively dying - poor urine output and minimally arousable  VITAL SIGNS: BP (!) 129/43 (BP Location: Right Arm)   Pulse  (!) 53   Temp 98.8 F (37.1 C) (Axillary)   Resp 10   Ht 5' 5.5" (1.664 m)   Wt (!) 153.2 kg (337 lb 11.9 oz)   SpO2 100%   BMI 55.35 kg/m   VENTILATOR SETTINGS:    INTAKE / OUTPUT:  Intake/Output Summary (Last 24 hours) at 11/11/2017 0840 Last data filed at 11/10/2017 1844 Gross per 24 hour  Intake 170 ml  Output 50 ml  Net 120 ml     PHYSICAL EXAMINATION:  General Appearance:    Looks criticall ill OBESE - +  Head:    Normocephalic, without obvious abnormality, atraumatic  Eyes:    PERRL - yes, conjunctiva/corneas - clear      Ears:    Normal external ear canals, both ears  Nose:   NG tube - no but has  o2  Throat:  ETT TUBE - no , OG tube - no ? DEATH RATTLE  Neck:   Supple,  No enlargement/tenderness/nodules     Lungs:     Clear to auscultation bilaterally,   Chest wall:    No deformity  Heart:    S1 and S2 normal, no murmur, CVP - no.  Pressors - no  Abdomen:     Soft, no masses, no organomegaly  Genitalia:    Not done  Rectal:   not done  Extremities:   Extremities- edema     Skin:   Intact in exposed areas .  Neurologic:   Sedation - fent gtt -> RASS - -3 . Moves all 4s - na. CAM-ICU - na . Orientation - na    PULMONARY Recent Labs  Lab 11/05/17 0400 11/07/17 0350  PHART 7.440 7.302*  PCO2ART 43.0 55.0*  PO2ART 73.9* 73.0*  HCO3 28.7* 26.4  O2SAT 95.4 92.7    CBC Recent Labs  Lab 11/07/17 0343 11/08/17 0313 11/09/17 0717  HGB 7.1* 7.0* 7.3*  HCT 23.6* 22.6* 23.5*  WBC 9.9 9.6 13.4*  PLT 326 347 385    COAGULATION No results for input(s): INR in the last 168 hours.  CARDIAC  No results for input(s): TROPONINI in the last 168 hours. No results for input(s): PROBNP in the last 168 hours.   CHEMISTRY Recent Labs  Lab 11/05/17 0345 11/06/17 0342 11/07/17 0343 11/08/17 0313  NA 143 143 143 144  K 3.6 4.3 4.5 4.2  CL 106 105 107 107  CO2 26 25 24 25   GLUCOSE 128* 120* 132* 136*  BUN 42* 52* 63* 69*  CREATININE 2.61* 2.79*  3.36* 3.87*  CALCIUM 8.3* 8.3* 8.1* 8.3*  MG 1.9 2.0 2.0  --   PHOS 3.3 4.0 4.9*  --    Estimated Creatinine Clearance: 19.1 mL/min (A) (by C-G formula based on SCr of 3.87 mg/dL (H)).   LIVER Recent Labs  Lab 11/08/17 0313  AST 34  ALT 21  ALKPHOS 144*  BILITOT 0.4  PROT 7.1  ALBUMIN 2.4*     INFECTIOUS Recent Labs  Lab 11/05/17 0345  PROCALCITON 1.13     ENDOCRINE CBG (last 3)  Recent Labs    11/08/17 2347 11/09/17 0422 11/09/17 0746  GLUCAP 150* 160* 151*         IMAGING x48h  - image(s) personally visualized  -   highlighted in bold No results found.   ASSESSMENT / PLAN:  Active Problems:   Acute respiratory failure [facility])  Patient Active Problem List   Diagnosis Date Noted  . Acute respiratory failure (HCC) 11/02/2017  . Sacral pressure ulcer 09/22/2017  . CAP (community acquired pneumonia) 09/20/2017  . Acute on chronic heart failure (HCC) 09/15/2017  . Diabetic foot ulcer (HCC) 01/19/2017  . Chronic kidney failure, stage 3 (moderate) (HCC) 10/27/2016  . Chronic diastolic CHF (congestive heart failure) (HCC) 10/19/2016  . Atrial flutter (HCC) 07/11/2016  . Chronic venous insufficiency 06/30/2016  . Goals of care, counseling/discussion 07/24/2015  . Severe obesity (BMI >= 40) (HCC) 08/28/2014  . Anemia 05/03/2014  . 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 Healthcare maintenance 03/19/2014  . CAD (coronary artery disease) 05/03/2011  . Hypertension 05/02/2011  . Hyperlipidemia 05/02/2011  . Lumbar disc disease 05/02/2011     11/11/17 - remains sedated on fent gtt. Worsening renal failure +. ? Has developed death rattle  Plan Continue fent gtt for comfort - titrate accordinly Other comfort measures  Life expectancy ? days     Dr. Kalman Shan, M.D., [facility].C.P Pulmonary and Critical Care Medicine Staff Physician, [facility] Center Director - Interstitial Lung Disease  Program   Pulmonary Fibrosis [facility] at [facility] [place], [place], [address]  Pager: [phone], If no answer or between  15:00h - 7:00h: call [phone] Telephone: [phone]

## 2017-11-12 DIAGNOSIS — Z66 Do not resuscitate: Secondary | ICD-10-CM

## 2017-11-12 DIAGNOSIS — Z515 Encounter for palliative care: Secondary | ICD-10-CM

## 2017-11-12 DIAGNOSIS — G9340 Encephalopathy, unspecified: Secondary | ICD-10-CM

## 2017-11-12 MED ORDER — FENTANYL CITRATE (PF) 100 MCG/2ML IJ SOLN
25.0000 ug | INTRAMUSCULAR | Status: DC | PRN
  Administered 2017-11-12 (×3): 50 ug via INTRAVENOUS
  Administered 2017-11-13 (×2): 100 ug via INTRAVENOUS
  Filled 2017-11-12 (×5): qty 2

## 2017-11-12 MED ORDER — CHLORHEXIDINE GLUCONATE 0.12 % MT SOLN
OROMUCOSAL | Status: AC
  Filled 2017-11-12: qty 15

## 2017-11-12 NOTE — Progress Notes (Signed)
PULMONARY / CRITICAL CARE MEDICINE   Name: Jamie Mason MRN: [id number] DOB: [birth year]    ADMISSION DATE:  11/02/2017  REFERRING MD:  Dr. Anitra Lauth  CHIEF COMPLAINT:  Short of breath  brief 77 y/o female former smoker presented to ER 5/23 with c/o leg pains for 5 days (non-ambulatory due to chronic pain/swelling).  She was to be discharged from ER after meeting with social worker.  However, in ER she became short of breath and started on Bipap. She was noted to have wheezing and CXR showed pulmonary edema.  She was given neb tx and lasix.  She become more lethargic.  ABG showed respiratory acidosis.  There was concern for pneumonia also.  She was intubated in ER and started on ABx.    has a past medical history of Atrial flutter with rapid ventricular response (HCC) (07/11/2016), CAD (coronary artery disease) (05/04/2011), Diabetes mellitus without complication (HCC), Diastolic heart failure (HCC) (78/2956), Hyperlipidemia, Hypertension, Lumbar disc disease, Morbid Obesity (05/02/2011), and Non-ST elevation myocardial infarction (NSTEMI), initial episode of care [facility]) (05/02/2011).   has a past surgical history that includes none; left heart catheterization with coronary angiogram (N/A, 05/03/2011); Cardiac catheterization (N/A, 07/11/2016); and Cardioversion (N/A, 07/13/2016).   Imaging Chest x-ray 5/25-ET tube in stable position.  More focal density in the right upper lobe concerning for pneumonia.   Chest x-ray 5/26-right upper lobe consolidation has improved.  ET tube in stable pressure Chest x-ray 5/27-persistent right lung opacity.  ET tube in good position. I reviewed the images personally.  STUDIES:  PFT 04/10/14 >> FEV1 1.50 (87%), FEV1% 77, TLC 4.54 (92%), DLCO 56% LHC 07/11/16 >> chronic occlusion mid RCA with collaterals, mod distal Lt MCA, mid and distal LAD, ostial Lt Cx, and OM2 Echo 09/17/17 >> mild LVH, EF 60 to 65%, grade 2 DD, mild LA dilation, mod RV dilation with  systolic dysfx, PAS 51 mmHg  CULTURES: Blood 5/23 >> gram-positive rods Sputum 5/23 >>  ANTIBIOTICS: Azactam 5/23 >> 5/23 Vancomycin 5/23 >> 5/24 Cefepime 5/23 >>  Zyvox 5/25 >>  SIGNIFICANT EVENTS: 5/23 Admit  LINES/TUBES: ETT 5/23 5/30 extubated and transitioned to comfort 5/31 - Unresponsive, does appear comfortable currently 11/11/17 - in palliative bed 6E13 at [facility]. On fent gtt . Per RN - not much change. Believes is actively dying - poor urine output and minimally arousable   SUBJECTIVE/OVERNIGHT/INTERVAL HX 11/12/17 - unchanged. On fent gtt atlower dose gtt. Anuric. D/w APP Anders Simmonds who took care of patient few days ago -> DNR, NO HD, Comofort care is the goal. Underlying quality of life ECOG 4 and comorbidities were determinative for goals of care   VITAL SIGNS: BP (!) 121/58 (BP Location: Left Wrist)   Pulse 69   Temp 98.5 F (36.9 C) (Oral)   Resp 20   Ht 5' 5.5" (1.664 m)   Wt (!) 153.2 kg (337 lb 11.9 oz)   SpO2 98%   BMI 55.35 kg/m   VENTILATOR SETTINGS:    INTAKE / OUTPUT:  Intake/Output Summary (Last 24 hours) at 11/12/2017 0835 Last data filed at 11/12/2017 0544 Gross per 24 hour  Intake 0 ml  Output 375 ml  Net -375 ml     PHYSICAL EXAMINATION:  General Appearance:    Looks criticall ill OBESE - +  Head:    Normocephalic, without obvious abnormality, atraumatic  Eyes:    PERRL - yes, conjunctiva/corneas - clear      Ears:    Normal external ear canals,  both ears  Nose:   NG tube - no but has [place]  Throat:  ETT TUBE - no , OG tube - no  Neck:   Supple,  No enlargement/tenderness/nodules     Lungs:     Clear to auscultation bilaterally,  Chest wall:    No deformity  Heart:    S1 and S2 normal, no murmur, CVP - no.  Pressors - no  Abdomen:     Soft, no masses, no organomegaly  Genitalia:    Not done  Rectal:   not done  Extremities:   Extremities- intact     Skin:   Intact in exposed areas .     Neurologic:   Sedation -  fent gtt -> RASS - -3 but occ opens eyes and moves and shakes head. Moves all 4s - NA CAM-ICU -  . Orientation - NA. Snores (not death rattle). ? Some myofascial twitches of uremia     PULMONARY Recent Labs  Lab 11/07/17 0350  PHART 7.302*  PCO2ART 55.0*  PO2ART 73.0*  HCO3 26.4  O2SAT 92.7    CBC Recent Labs  Lab 11/07/17 0343 11/08/17 0313 11/09/17 0717  HGB 7.1* 7.0* 7.3*  HCT 23.6* 22.6* 23.5*  WBC 9.9 9.6 13.4*  PLT 326 347 385    COAGULATION No results for input(s): INR in the last 168 hours.  CARDIAC  No results for input(s): TROPONINI in the last 168 hours. No results for input(s): PROBNP in the last 168 hours.   CHEMISTRY Recent Labs  Lab 11/06/17 0342 11/07/17 0343 11/08/17 0313  NA 143 143 144  K 4.3 4.5 4.2  CL 105 107 107  CO2 25 24 25   GLUCOSE 120* 132* 136*  BUN 52* 63* 69*  CREATININE 2.79* 3.36* 3.87*  CALCIUM 8.3* 8.1* 8.3*  MG 2.0 2.0  --   PHOS 4.0 4.9*  --    Estimated Creatinine Clearance: 19.1 mL/min (A) (by C-G formula based on SCr of 3.87 mg/dL (H)).   LIVER Recent Labs  Lab 11/08/17 0313  AST 34  ALT 21  ALKPHOS 144*  BILITOT 0.4  PROT 7.1  ALBUMIN 2.4*     INFECTIOUS No results for input(s): LATICACIDVEN, PROCALCITON in the last 168 hours.   ENDOCRINE CBG (last 3)  No results for input(s): GLUCAP in the last 72 hours.       IMAGING x48h  - image(s) personally visualized  -   highlighted in bold No results found.   ASSESSMENT / PLAN:  Active Problems:   Acute respiratory failure (HCC)   Acute renal failure (ARF) (HCC)  Patient Active Problem List   Diagnosis Date Noted  . Terminal care 11/12/2017  . DNR (do not resuscitate) 11/12/2017  . Acute renal failure (ARF) (HCC)   . Acute respiratory failure (HCC) 11/02/2017  . Sacral pressure ulcer 09/22/2017  . CAP (community acquired pneumonia) 09/20/2017  . Acute on chronic heart failure (HCC) 09/15/2017  . Diabetic foot ulcer (HCC) 01/19/2017  .  Chronic kidney failure, stage 3 (moderate) (HCC) 10/27/2016  . Chronic diastolic CHF (congestive heart failure) (HCC) 10/19/2016  . Atrial flutter (HCC) 07/11/2016  . Chronic venous insufficiency 06/30/2016  . Goals of care, counseling/discussion 07/24/2015  . Severe obesity (BMI >= 40) (HCC) 08/28/2014  . Anemia 05/03/2014  . 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 Healthcare maintenance 03/19/2014  . CAD (coronary artery disease) 05/03/2011  . Hypertension 05/02/2011  . Hyperlipidemia 05/02/2011  .  Lumbar disc disease 05/02/2011     11/12/17 -  Acute encephalopathy -obtundation - on fent gtt; Acute renal failure - clinical worsening. Comfort care. TErminal care  - prognosis appears several days -and ? Even a week. Seems she might not need high dose fent gtt. Max prognosis in renal failure is < 3 weeks   Plan Asked RN to give fent gtt holiday for 1-3 hours and give MD call back - will titrate to gtt v prn based on response  Other comfort measures to continue  Will ask [facility] to assume primary from 11/13/17 - placed call to Dr Mal Misty        Dr. Kalman Shan, M.D., [facility].C.P Pulmonary and Critical Care Medicine Staff Physician, [facility] Center Director - Interstitial Lung Disease  Program  Pulmonary Fibrosis [facility] at [facility] [place], [place], [address]  Pager: [phone], If no answer or between  15:00h - 7:00h: call [phone] Telephone: [phone]

## 2017-11-13 MED ORDER — LORAZEPAM 2 MG/ML IJ SOLN
INTRAMUSCULAR | Status: AC
  Filled 2017-11-13: qty 1

## 2017-11-13 MED ORDER — SCOPOLAMINE 1 MG/3DAYS TD PT72: 1.0000 | MEDICATED_PATCH | TRANSDERMAL | Status: DC

## 2017-11-13 MED ORDER — LIP MEDEX EX OINT
TOPICAL_OINTMENT | CUTANEOUS | Status: AC
  Administered 2017-11-13: 11:00:00
  Filled 2017-11-13: qty 7

## 2017-11-13 MED ORDER — FENTANYL 2500MCG IN NS 250ML (10MCG/ML) PREMIX INFUSION: 0.0000 ug/h | INTRAVENOUS | Status: DC

## 2017-11-13 MED ORDER — ATROPINE SULFATE 1 % OP SOLN
2.0000 [drp] | Freq: Four times a day (QID) | OPHTHALMIC | Status: DC | PRN
  Administered 2017-11-13 – 2017-11-14 (×3): 4 [drp] via SUBLINGUAL
  Filled 2017-11-13: qty 2

## 2017-11-13 MED ORDER — MORPHINE 100MG IN NS 100ML (1MG/ML) PREMIX INFUSION
1.0000 mg/h | INTRAVENOUS | Status: DC
  Filled 2017-11-13: qty 100

## 2017-11-13 MED ORDER — LORAZEPAM 2 MG/ML IJ SOLN
1.0000 mg | Freq: Four times a day (QID) | INTRAMUSCULAR | Status: DC
  Administered 2017-11-13 – 2017-11-14 (×2): 1 mg via INTRAVENOUS
  Filled 2017-11-13 (×2): qty 1

## 2017-11-13 MED ORDER — SODIUM CHLORIDE 0.9 % IV SOLN
1.0000 mg/h | INTRAVENOUS | Status: DC
  Administered 2017-11-13 (×2): 1 mg/h via INTRAVENOUS
  Filled 2017-11-13 (×2): qty 2.5

## 2017-11-13 NOTE — Progress Notes (Signed)
Wasted 5 cc dilaudid drip (0.5mg /ml) with Danford BadShanita, RN.

## 2017-11-13 NOTE — Progress Notes (Signed)
Nutrition Brief Note  Chart reviewed. Pt transitioned to comfort care.  No further nutrition interventions warranted at this time.   Tilda FrancoLindsey Dwana Garin, MS, RD, LDN [facility] Clinical Dietitian Pager: [phone] After Hours Pager: [phone]

## 2017-11-13 NOTE — Progress Notes (Signed)
PROGRESS NOTE    Jamie Mason  HYQ:[id number] DOB: May 26, 1941 DOA: 11/02/2017 PCP: Bartholomew Crews, MD   Brief Narrative:  HPI On 11/02/2017 by Dr. Chesley Mires (ICU) 77 yo female former smoker presented to ER with c/o leg pains for 5 days.  She was to be discharged from ER after meeting with social worker.  In ER she became short of breath and started on Bipap.  She was noted to have wheezing and CXR showed pulmonary edema.  She was given neb tx and lasix.  She become more lethargic.  ABG showed respiratory acidosis.  There was concern for pneumonia also.  She was intubated in ER and started on ABx.  Interim history She was admitted on 11/02/2017 for acute on chronic respiratory failure secondary to multiple causes including pneumonia pulmonary edema and heart failure.  She was intubated and extubated on 11/09/2017.  She received 7 days of antibiotics as well as diuretics and steroids with no improvement in her symptoms.  It was decided the patient will transition to comfort care.  [facility] assumed care patient on 11/13/2017. Assessment & Plan   Acute on chronic respiratory failure secondary to acute diastolic heart failure with pulmonary edema and possible pneumonia/ Goals of care -Patient required intubation and was extubated on 11/09/2017.  She was transitioned to comfort care at that time.  Patient received 7 days of antibiotics as well as diuretics and a steroid trial without any improvement.  -Patient was on a fentanyl drip however this was discontinued -Have ordered Dilaudid drip to be titrated for comfort -Have placed patient on Ativan as well as atropine drops for secretions  Bacteremia -Blood culture showed 1/2 Corynebacterium blood cultures suspect contamination  Other medical problems:  AKI History of coronary artery disease, hypertension, atrial flutter, Diastolic heart failure Acute metabolic encephalopathy Diabetes mellitus, type II Anemia  DVT Prophylaxis  None,  Comfort  Code Status: DNR  Family Communication: Granddaughter at bedside  Disposition Plan: Admitted.   Consultants PCCM  Procedures  Intubation/extubation  Antibiotics   Anti-infectives (From admission, onward)   Start     Dose/Rate Route Frequency Ordered Stop   11/08/17 0800  ceFEPIme (MAXIPIME) 1 g in sodium chloride 0.9 % 100 mL IVPB  Status:  Discontinued     1 g 200 mL/hr over 30 Minutes Intravenous Every 24 hours 11/07/17 1235 11/08/17 1051   11/04/17 2000  vancomycin (VANCOCIN) 1,500 mg in sodium chloride 0.9 % 500 mL IVPB  Status:  Discontinued     1,500 mg 250 mL/hr over 120 Minutes Intravenous Every 48 hours 11/02/17 2007 11/03/17 0946   11/04/17 1100  linezolid (ZYVOX) IVPB 600 mg  Status:  Discontinued     600 mg 300 mL/hr over 60 Minutes Intravenous Every 12 hours 11/04/17 1051 11/08/17 1051   11/03/17 0800  ceFEPIme (MAXIPIME) 1 g in sodium chloride 0.9 % 100 mL IVPB  Status:  Discontinued     1 g 200 mL/hr over 30 Minutes Intravenous Every 12 hours 11/02/17 1856 11/07/17 1235   11/02/17 1800  vancomycin (VANCOCIN) 1,500 mg in sodium chloride 0.9 % 500 mL IVPB     1,500 mg 250 mL/hr over 120 Minutes Intravenous  Once 11/02/17 1734 11/02/17 2203   11/02/17 1745  ceFEPIme (MAXIPIME) 2 g in sodium chloride 0.9 % 100 mL IVPB     2 g 200 mL/hr over 30 Minutes Intravenous  Once 11/02/17 1738 11/02/17 2257   11/02/17 1715  aztreonam (AZACTAM) 2 g in  sodium chloride 0.9 % 100 mL IVPB  Status:  Discontinued     2 g 200 mL/hr over 30 Minutes Intravenous  Once 11/02/17 1714 11/02/17 1844   11/02/17 1715  vancomycin (VANCOCIN) IVPB 1000 mg/200 mL premix     1,000 mg 200 mL/hr over 60 Minutes Intravenous  Once 11/02/17 1714 11/02/17 1927      Subjective:   Jamie Mason seen and examined today.  Currently comfortable.   Objective:   Vitals:   11/11/17 0414 11/11/17 2200 11/12/17 0544 11/12/17 1954  BP: (!) 129/43 (!) 102/53 (!) 121/58 131/60  Pulse: (!) 53  69  66  Resp:  19 20 (!) 22  Temp:  98.5 F (36.9 C)  98.6 F (37 C)  TempSrc:  Oral  Oral  SpO2: 100%  98% 96%  Weight:      Height:        Intake/Output Summary (Last 24 hours) at 11/13/2017 1429 Last data filed at 11/13/2017 0537 Gross per 24 hour  Intake -  Output 250 ml  Net -250 ml   Filed Weights   11/07/17 0447 11/08/17 0500 11/09/17 0500  Weight: (!) 153.4 kg (338 lb 3 oz) (!) 156.3 kg (344 lb 9.3 oz) (!) 153.2 kg (337 lb 11.9 oz)    Exam  General: Well developed, chronically ill-appearing, morbidly obese, NAD  HEENT: NCAT, mucous membranes moist.   Neck: Supple  Cardiovascular: S1 S2 auscultated, no rubs, murmurs or gallops. Regular rate and rhythm.  Respiratory: Clear to auscultation. Upper airway congestion/rattle  Abdomen: Soft, obese, nontender, nondistended, + bowel sounds  Extremities: warm dry without cyanosis clubbing. +LE edema B/L   Neuro: Not responsive  Skin: Without rashes exudates or nodules  Psych: cannot assess    Data Reviewed: I have personally reviewed following labs and imaging studies  CBC: Recent Labs  Lab 11/07/17 0343 11/08/17 0313 11/09/17 0717  WBC 9.9 9.6 13.4*  HGB 7.1* 7.0* 7.3*  HCT 23.6* 22.6* 23.5*  MCV 86.4 84.3 85.5  PLT 326 347 845   Basic Metabolic Panel: Recent Labs  Lab 11/07/17 0343 11/08/17 0313  NA 143 144  K 4.5 4.2  CL 107 107  CO2 24 25  GLUCOSE 132* 136*  BUN 63* 69*  CREATININE 3.36* 3.87*  CALCIUM 8.1* 8.3*  MG 2.0  --   PHOS 4.9*  --    GFR: Estimated Creatinine Clearance: 19.1 mL/min (A) (by C-G formula based on SCr of 3.87 mg/dL (H)). Liver Function Tests: Recent Labs  Lab 11/08/17 0313  AST 34  ALT 21  ALKPHOS 144*  BILITOT 0.4  PROT 7.1  ALBUMIN 2.4*   No results for input(s): LIPASE, AMYLASE in the last 168 hours. No results for input(s): AMMONIA in the last 168 hours. Coagulation Profile: No results for input(s): INR, PROTIME in the last 168 hours. Cardiac Enzymes: No  results for input(s): CKTOTAL, CKMB, CKMBINDEX, TROPONINI in the last 168 hours. BNP (last 3 results) No results for input(s): PROBNP in the last 8760 hours. HbA1C: No results for input(s): HGBA1C in the last 72 hours. CBG: Recent Labs  Lab 11/08/17 1622 11/08/17 2003 11/08/17 2347 11/09/17 0422 11/09/17 0746  GLUCAP 153* 146* 150* 160* 151*   Lipid Profile: No results for input(s): CHOL, HDL, LDLCALC, TRIG, CHOLHDL, LDLDIRECT in the last 72 hours. Thyroid Function Tests: No results for input(s): TSH, T4TOTAL, FREET4, T3FREE, THYROIDAB in the last 72 hours. Anemia Panel: No results for input(s): VITAMINB12, FOLATE, FERRITIN, TIBC, IRON, RETICCTPCT  in the last 72 hours. Urine analysis:    Component Value Date/Time   COLORURINE AMBER (A) 09/14/2017 1658   APPEARANCEUR TURBID (A) 09/14/2017 1658   LABSPEC 1.014 09/14/2017 1658   PHURINE 5.0 09/14/2017 1658   GLUCOSEU NEGATIVE 09/14/2017 1658   HGBUR SMALL (A) 09/14/2017 1658   BILIRUBINUR NEGATIVE 09/14/2017 1658   BILIRUBINUR Negative 03/19/2014 1631   KETONESUR NEGATIVE 09/14/2017 1658   PROTEINUR 30 (A) 09/14/2017 1658   UROBILINOGEN 0.2 03/19/2014 1631   NITRITE NEGATIVE 09/14/2017 1658   LEUKOCYTESUR LARGE (A) 09/14/2017 1658   Sepsis Labs: '@LABRCNTIP' (procalcitonin:4,lacticidven:4)  )No results found for this or any previous visit (from the past 240 hour(s)).    Radiology Studies: No results found.   Scheduled Meds: . chlorhexidine gluconate (MEDLINE KIT)  15 mL Mouth Rinse BID  . LORazepam  1 mg Intravenous Q6H  . mouth rinse  15 mL Mouth Rinse QID   Continuous Infusions: . sodium chloride 10 mL/hr at 11/10/17 0000  . HYDROmorphone 1 mg/hr (11/13/17 1043)     LOS: 11 days   Time Spent in minutes   45 minutes  Jarquis Walker D.O. on 11/13/2017 at 2:29 PM  Between 7am to 7pm - Pager - [phone]  After 7pm go to [email or web address] - password [email or web address]  And look for the night coverage person covering for me  after hours  [facility] Office  [phone]

## 2017-11-14 DIAGNOSIS — Z515 Encounter for palliative care: Secondary | ICD-10-CM

## 2017-11-14 DIAGNOSIS — Z7189 Other specified counseling: Secondary | ICD-10-CM

## 2017-11-14 MED ORDER — GLYCOPYRROLATE 0.2 MG/ML IJ SOLN
0.4000 mg | INTRAMUSCULAR | Status: DC
  Administered 2017-11-14: 0.4 mg via INTRAVENOUS
  Filled 2017-11-14: qty 2

## 2017-11-14 MED ORDER — METOCLOPRAMIDE HCL 5 MG/ML IJ SOLN
10.0000 mg | Freq: Four times a day (QID) | INTRAMUSCULAR | Status: DC
  Administered 2017-11-14: 10 mg via INTRAVENOUS
  Filled 2017-11-14 (×2): qty 2

## 2017-11-14 NOTE — Progress Notes (Signed)
LCSW following for residential hospice placement.  [facility]  LCSW confirmed bed with facility.   Patient will transport by [facility].  LCSW will fax dc docs to facility when available.  RN report #: [phone]  Coralyn HellingBernette Teriana Danker, Norberta KeensLSCW [place] CSW [phone]

## 2017-11-14 NOTE — Clinical Social Work Note (Signed)
Clinical Social Work Assessment  Patient Details  Name: Jamie Mason MRN: [id number] Date of Birth: 07/06/1940  Date of referral:  11/14/17               Reason for consult:  Facility Placement                Permission sought to share information with:  Family Supports, Facility Contact Representative(Patient is not oriented. ) Permission granted to share information::     Name::        Agency::     Relationship::     Contact Information:     Housing/Transportation Living arrangements for the past 2 months:  Single Family Home Source of Information:  Adult Children Patient Interpreter Needed:  None Criminal Activity/Legal Involvement Pertinent to Current Situation/Hospitalization:  No - Comment as needed Significant Relationships:  Adult Children Lives with:  Spouse Do you feel safe going back to the place where you live?    Need for family participation in patient care:     Care giving concerns: None at time of assessment.    Social Worker assessment / plan:  LCSW consulted for residential hospice placement.   LCSw spoke with daughter, Jamie Mason. After palliative meeting, [facility]   [facility]   PLAN: [facility]   Employment status:  Retired Database administratornsurance information:  Managed Medicare PT Recommendations:  No Follow Up Information / Referral to community resources:     Patient/Family's Response to care:  Family thankful for LCSW visit.   Patient/Family's Understanding of and Emotional Response to Diagnosis, Current Treatment, and Prognosis:  Family is realistic about patients condition. Family agreeable to full comfort care and transition to residential hospice.   Emotional Assessment Appearance:  Appears stated age Attitude/Demeanor/Rapport:    Affect (typically observed):  Unable to Assess Orientation:  (actively dying ) Alcohol / Substance use:  Not Applicable Psych involvement  (Current and /or in the community):  No (Comment)  Discharge Needs  Concerns to be addressed:  No discharge needs identified Readmission within the last 30 days:  No Current discharge risk:  None Barriers to Discharge:  No Barriers Identified   Jamie HellingBernette Evan Osburn, LCSW 11/14/2017, 1:58 PM

## 2017-11-14 NOTE — Discharge Summary (Signed)
Physician Discharge Summary  Jamie Mason ZOX:[id number] DOB: 1941-03-24 DOA: 11/02/2017  PCP: Burns Spain, MD  Admit date: 11/02/2017 Discharge date: 11/14/2017  Time spent: 45 minutes  Recommendations for Outpatient Follow-up:  Patient will be discharged to [facility].  Discharge Diagnoses:  Acute on chronic respiratory failure secondary to acute diastolic heart failure with pulmonary edema and possible pneumonia/ Goals of care Bacteremia AKI History of coronary artery disease, hypertension, atrial flutter, Diastolic heart failure Acute metabolic encephalopathy Diabetes mellitus, type II Anemia  Discharge Condition: Stable  Diet recommendation: comfort care  Filed Weights   11/07/17 0447 11/08/17 0500 11/09/17 0500  Weight: (!) 153.4 kg (338 lb 3 oz) (!) 156.3 kg (344 lb 9.3 oz) (!) 153.2 kg (337 lb 11.9 oz)    History of present illness:  On 11/02/2017 by Dr. Coralyn Helling (ICU) 77 yo female former smoker presented to ER with c/o leg pains for 5 days. She was to be discharged from ER after meeting with social worker. In ER she became short of breath and started on Bipap. She was noted to have wheezing and CXR showed pulmonary edema. She was given neb tx and lasix. She become more lethargic. ABG showed respiratory acidosis. There was concern for pneumonia also. She was intubated in ER and started on ABx.  Hospital Course:  Interim history She was admitted on 11/02/2017 for acute on chronic respiratory failure secondary to multiple causes including pneumonia pulmonary edema and heart failure.  She was intubated and extubated on 11/09/2017.  She received 7 days of antibiotics as well as diuretics and steroids with no improvement in her symptoms.  It was decided the patient will transition to comfort care.  [facility] assumed care patient on 11/13/2017.  Acute on chronic respiratory failure secondary to acute diastolic heart failure with pulmonary edema and possible  pneumonia/ Goals of care -Patient required intubation and was extubated on 11/09/2017.  She was transitioned to comfort care at that time.  Patient received 7 days of antibiotics as well as diuretics and a steroid trial without any improvement.  -Patient was on a fentanyl drip however this was discontinued -Have ordered Dilaudid drip to be titrated for comfort -Have placed patient on Ativan as well as atropine drops for secretions -Palliative care consulted and appreciated- recommended Robinul as well as scheduled Reglan  Bacteremia -Blood culture showed 1/2 Corynebacterium blood cultures suspect contamination  Other medical problems:  AKI History of coronary artery disease, hypertension, atrial flutter, Diastolic heart failure Acute metabolic encephalopathy Diabetes mellitus, type II Anemia  Consultants PCCM Palliative Care  Procedures  Intubation/extubation  Discharge Exam: Vitals:   11/12/17 1954 11/13/17 2041  BP: 131/60 (!) 119/53  Pulse: 66 (!) 59  Resp: (!) 22 20  Temp: 98.6 F (37 C) 99 F (37.2 C)  SpO2: 96% 93%   Exam  General: Well developed, chronically ill-appearing, obese, no apparent distress  HEENT: NCAT, mucous membranes moist.  Oral secretions  Neck: Supple  Cardiovascular: S1 S2 auscultated, no rubs, murmurs or gallops. Regular rate and rhythm.  Respiratory: Clear to auscultation bilaterally.  Upper airway congestion  Abdomen: Soft, nontender, nondistended, + bowel sounds  Extremities: warm dry without cyanosis clubbing.  Lateral LE edema  Discharge Instructions  Allergies as of 11/14/2017      Reactions   Lisinopril Swelling   Tongue swelling    Ace Inhibitors Other (See Comments)   unknown   Codeine Itching, Rash   Penicillins Itching, Rash   Has patient had  a PCN reaction causing immediate rash, facial/tongue/throat swelling, SOB or lightheadedness with hypotension: unknown Has patient had a PCN reaction causing severe rash involving  mucus membranes or skin necrosis: unknown Has patient had a PCN reaction that required hospitalization: no Has patient had a PCN reaction occurring within the last 10 years: no If all of the above answers are "NO", then may proceed with Cephalosporin use. TOLERATES CEPHALOSPORINS   Tomato    Itchy rash      Medication List    STOP taking these medications   acetaminophen 500 MG tablet Commonly known as:  TYLENOL   albuterol 108 (90 Base) MCG/ACT inhaler Commonly known as:  VENTOLIN HFA   amiodarone 200 MG tablet Commonly known as:  PACERONE   apixaban 5 MG Tabs tablet Commonly known as:  ELIQUIS   ASPIR-LOW 81 MG EC tablet Generic drug:  aspirin   atorvastatin 40 MG tablet Commonly known as:  LIPITOR   azithromycin 250 MG tablet Commonly known as:  ZITHROMAX   Capsaicin-Menthol-Methyl Sal 0.025-1-12 % Crea Commonly known as:  capsaicin-methyl sal-menthol   carvedilol 12.5 MG tablet Commonly known as:  COREG   cefdinir 300 MG capsule Commonly known as:  OMNICEF   diltiazem 240 MG 24 hr capsule Commonly known as:  CARDIZEM CD   furosemide 80 MG tablet Commonly known as:  LASIX   hydrALAZINE 50 MG tablet Commonly known as:  APRESOLINE   hydrocerin Crea   metFORMIN 500 MG tablet Commonly known as:  GLUCOPHAGE   polyethylene glycol powder powder Commonly known as:  GLYCOLAX/MIRALAX   traMADol 50 MG tablet Commonly known as:  ULTRAM      Allergies  Allergen Reactions  . Lisinopril Swelling    Tongue swelling   . Ace Inhibitors Other (See Comments)    unknown  . Codeine Itching and Rash  . Penicillins Itching and Rash    Has patient had a PCN reaction causing immediate rash, facial/tongue/throat swelling, SOB or lightheadedness with hypotension: unknown Has patient had a PCN reaction causing severe rash involving mucus membranes or skin necrosis: unknown Has patient had a PCN reaction that required hospitalization: no Has patient had a PCN reaction  occurring within the last 10 years: no If all of the above answers are "NO", then may proceed with Cephalosporin use. TOLERATES CEPHALOSPORINS  . Tomato     Itchy rash   Follow-up Information    Schedule an appointment as soon as possible for a visit  with Burns Spain, MD.   Specialty:  Internal Medicine Contact information: [address] [place] [address] [phone]            The results of significant diagnostics from this hospitalization (including imaging, microbiology, ancillary and laboratory) are listed below for reference.    Significant Diagnostic Studies: Dg Chest 1 View  Result Date: 11/05/2017 CLINICAL DATA:  Intubation and placement of orogastric tube EXAM: CHEST  1 VIEW; ABDOMEN - 1 VIEW COMPARISON:  01/19/2017 FINDINGS: Endotracheal tube tip is at the level of the clavicular heads. There is cardiomegaly with right-sided volume loss. Hazy opacities throughout both lungs, likely moderate pulmonary edema. The orogastric tube side port overlies the gastric body. IMPRESSION: 1. Radiographically acceptable positions of endotracheal and orogastric tubes. 2. Hazy opacities through both lungs, likely pulmonary edema. Cardiomegaly. Electronically Signed   By: Deatra Robinson M.D.   On: 11/05/2017 23:36   Dg Abd 1 View  Result Date: 11/05/2017 CLINICAL DATA:  Intubation and placement of  orogastric tube EXAM: CHEST  1 VIEW; ABDOMEN - 1 VIEW COMPARISON:  01/19/2017 FINDINGS: Endotracheal tube tip is at the level of the clavicular heads. There is cardiomegaly with right-sided volume loss. Hazy opacities throughout both lungs, likely moderate pulmonary edema. The orogastric tube side port overlies the gastric body. IMPRESSION: 1. Radiographically acceptable positions of endotracheal and orogastric tubes. 2. Hazy opacities through both lungs, likely pulmonary edema. Cardiomegaly. Electronically Signed   By: Deatra Robinson M.D.   On: 11/05/2017 23:36   US  Renal  Result Date: 11/07/2017 CLINICAL DATA:  Acute renal failure. EXAM: RENAL / URINARY TRACT ULTRASOUND COMPLETE COMPARISON:  CT 09/19/2017. FINDINGS: Right Kidney: Length: 10.6 cm. Echogenicity within normal limits. No mass or hydronephrosis visualized. Left Kidney: Length: 11.3 cm. Echogenicity within normal limits. No mass or hydronephrosis visualized. Bladder: Appears normal for degree of bladder distention. IMPRESSION: No acute or focal abnormality identified. Electronically Signed   By: Maisie Fus  Register   On: 11/07/2017 10:42   Dg Chest Port 1 View  Result Date: 11/08/2017 CLINICAL DATA:  Pulmonary edema. EXAM: PORTABLE CHEST 1 VIEW COMPARISON:  Radiograph of Nov 07, 2017. FINDINGS: Stable cardiomegaly. Endotracheal and nasogastric tubes are unchanged in position. No pneumothorax is noted. Stable bilateral lung opacities are noted concerning for edema or possibly pneumonia. No significant pleural effusion is noted. Bony thorax is unremarkable. IMPRESSION: Stable support apparatus. Stable bilateral lung opacities as described above. Electronically Signed   By: Lupita Raider, M.D.   On: 11/08/2017 07:50   Dg Chest Port 1 View  Result Date: 11/07/2017 CLINICAL DATA:  77 year old female with respiratory failure. Subsequent encounter. EXAM: PORTABLE CHEST 1 VIEW COMPARISON:  11/06/2017 chest x-ray. FINDINGS: Mild rotation to the right. Endotracheal tube tip 4.9 cm above the carina. Nasogastric tube courses below the diaphragm. Tip is not included on the present exam. Progression of asymmetric airspace disease greater on the right and within the lung bases which may represent progressive pulmonary edema. Infectious infiltrates, particularly in the lung bases, cannot be excluded in proper clinical setting. Cardiomegaly. Calcified aorta. No pneumothorax noted. IMPRESSION: Progressive asymmetric airspace disease most consistent with asymmetric pulmonary edema. Infectious infiltrates, particularly in the  lung bases, could not be excluded in proper clinical setting. Cardiomegaly. Aortic Atherosclerosis (ICD10-I70.0). Electronically Signed   By: Lacy Duverney M.D.   On: 11/07/2017 07:16   Dg Chest Port 1 View  Result Date: 11/06/2017 CLINICAL DATA:  Acute respiratory failure. EXAM: PORTABLE CHEST 1 VIEW COMPARISON:  11/05/2017 FINDINGS: Hazy right mid to lower lung zone opacity is similar to the previous day's study consistent with a combination of consolidation and pleural fluid. No new lung opacities. Endotracheal tube and nasal/orogastric tube are stable and well positioned. No pneumothorax. IMPRESSION: 1. Persistent right mid to lower lung zone opacity consistent with a combination of pleural fluid and pneumonia and/or atelectasis. No change from the previous day's study. Electronically Signed   By: Amie Portland M.D.   On: 11/06/2017 07:13   Dg Chest Port 1 View  Result Date: 11/05/2017 CLINICAL DATA:  Acute respiratory failure. EXAM: PORTABLE CHEST 1 VIEW COMPARISON:  Radiograph of Nov 04, 2017. FINDINGS: Stable cardiomegaly with central pulmonary vascular congestion. Endotracheal and nasogastric tubes are unchanged in position. No pneumothorax is noted. Increased right basilar opacity is noted concerning for edema or possibly pneumonia. Small pleural effusion cannot be excluded. Right upper lobe airspace opacity noted on prior exam is significantly improved. Bony thorax is unremarkable. IMPRESSION: Stable support apparatus. Right upper  lobe airspace opacity noted on prior exam is significantly improved. Increased right basilar opacity is noted concerning for edema or possibly pneumonia. Electronically Signed   By: Lupita Raider, M.D.   On: 11/05/2017 07:10   Dg Chest Port 1 View  Result Date: 11/04/2017 CLINICAL DATA:  Acute respiratory failure with hypoxia. EXAM: PORTABLE CHEST 1 VIEW COMPARISON:  Radiograph of Nov 03, 2017. FINDINGS: Endotracheal and nasogastric tubes are in grossly good  position. Stable cardiomegaly. Right upper lobe airspace opacity is noted concerning for pneumonia. No pneumothorax is noted. Mild bibasilar subsegmental atelectasis is noted. Bony thorax is unremarkable. IMPRESSION: Stable support apparatus. Increased right upper lobe airspace opacity is noted concerning for pneumonia. Mild bibasilar subsegmental atelectasis. Electronically Signed   By: Lupita Raider, M.D.   On: 11/04/2017 07:11   Dg Chest Port 1 View  Result Date: 11/03/2017 CLINICAL DATA:  Respiratory distress EXAM: PORTABLE CHEST 1 VIEW COMPARISON:  11/02/2017 FINDINGS: Endotracheal tube and nasogastric catheter are again seen and stable. Mild cardiomegaly is noted accentuated by the portable technique. Vascular congestion and edema are again identified and stable. No new focal abnormality is seen. No bony abnormality is noted. IMPRESSION: Stable vascular congestion and pulmonary edema. Electronically Signed   By: Alcide Clever M.D.   On: 11/03/2017 07:11   Dg Chest Portable 1 View  Result Date: 11/02/2017 CLINICAL DATA:  Respiratory failure, intubated. EXAM: PORTABLE CHEST 1 VIEW COMPARISON:  11/02/2017 FINDINGS: Stable cardiomegaly with aortic atherosclerosis. New endotracheal tube terminates 3.2 cm above the carina in satisfactory position. A gastric tube extends below the left hemidiaphragm. The tip and side port are excluded on this study. Diffuse airspace opacities consistent with pulmonary edema are redemonstrated slightly more confluent in the left upper and right lower lobes. Probable small bilateral pleural effusions. No pneumothorax. No acute osseous abnormality. IMPRESSION: 1. Satisfactory endotracheal tube position approximately 3.2 cm above the carina. 2. Gastric tube extends below the left hemidiaphragm. The tip and side port are excluded on this study. 3. Stable cardiomegaly with diffuse bilateral airspace disease consistent with pulmonary edema. Superimposed pneumonia would be difficult  to entirely exclude bilaterally. Electronically Signed   By: Tollie Eth M.D.   On: 11/02/2017 18:36   Dg Chest Port 1 View  Result Date: 11/02/2017 CLINICAL DATA:  77 year old with shortness of breath. EXAM: PORTABLE CHEST 1 VIEW COMPARISON:  09/17/2017 FINDINGS: Portable view of the chest demonstrates stable cardiomegaly. The trachea is midline. Atherosclerotic calcifications at the aortic arch. Hazy densities in both lungs with more prominent opacities in the lower lungs, right side greater than left. Negative for pneumothorax. No acute bone abnormality. IMPRESSION: Bilateral hazy lung densities are concerning for airspace disease and pulmonary edema. More confluent disease in the lower lungs, particularly on the right side. An infectious or inflammatory process cannot be excluded. Electronically Signed   By: Richarda Overlie M.D.   On: 11/02/2017 15:29    Microbiology: No results found for this or any previous visit (from the past 240 hour(s)).   Labs: Basic Metabolic Panel: Recent Labs  Lab 11/08/17 0313  NA 144  K 4.2  CL 107  CO2 25  GLUCOSE 136*  BUN 69*  CREATININE 3.87*  CALCIUM 8.3*   Liver Function Tests: Recent Labs  Lab 11/08/17 0313  AST 34  ALT 21  ALKPHOS 144*  BILITOT 0.4  PROT 7.1  ALBUMIN 2.4*   No results for input(s): LIPASE, AMYLASE in the last 168 hours. No results for input(s):  AMMONIA in the last 168 hours. CBC: Recent Labs  Lab 11/08/17 0313 11/09/17 0717  WBC 9.6 13.4*  HGB 7.0* 7.3*  HCT 22.6* 23.5*  MCV 84.3 85.5  PLT 347 385   Cardiac Enzymes: No results for input(s): CKTOTAL, CKMB, CKMBINDEX, TROPONINI in the last 168 hours. BNP: BNP (last 3 results) Recent Labs    01/21/17 0328 09/14/17 1700 11/02/17 1526  BNP 812.0* 310.6* 946.8*    ProBNP (last 3 results) No results for input(s): PROBNP in the last 8760 hours.  CBG: Recent Labs  Lab 11/08/17 1622 11/08/17 2003 11/08/17 2347 11/09/17 0422 11/09/17 0746  GLUCAP 153*  146* 150* 160* 151*       Signed:  Jaxston Chohan  [facility] 11/14/2017, 2:46 PM

## 2017-11-14 NOTE — Progress Notes (Signed)
PROGRESS NOTE    Jamie Mason  XTA:[id number] DOB: [birth year] DOA: 11/02/2017 PCP: Bartholomew Crews, MD   Brief Narrative:  HPI On 11/02/2017 by Dr. Chesley Mires (ICU) 77 yo female former smoker presented to ER with c/o leg pains for 5 days.  She was to be discharged from ER after meeting with social worker.  In ER she became short of breath and started on Bipap.  She was noted to have wheezing and CXR showed pulmonary edema.  She was given neb tx and lasix.  She become more lethargic.  ABG showed respiratory acidosis.  There was concern for pneumonia also.  She was intubated in ER and started on ABx.  Interim history She was admitted on 11/02/2017 for acute on chronic respiratory failure secondary to multiple causes including pneumonia pulmonary edema and heart failure.  She was intubated and extubated on 11/09/2017.  She received 7 days of antibiotics as well as diuretics and steroids with no improvement in her symptoms.  It was decided the patient will transition to comfort care.  [facility] assumed care patient on 11/13/2017. Assessment & Plan   Acute on chronic respiratory failure secondary to acute diastolic heart failure with pulmonary edema and possible pneumonia/ Goals of care -Patient required intubation and was extubated on 11/09/2017.  She was transitioned to comfort care at that time.  Patient received 7 days of antibiotics as well as diuretics and a steroid trial without any improvement.  -Patient was on a fentanyl drip however this was discontinued -Have ordered Dilaudid drip to be titrated for comfort -Have placed patient on Ativan as well as atropine drops for secretions -Palliative care consulted and appreciated  Bacteremia -Blood culture showed 1/2 Corynebacterium blood cultures suspect contamination  Other medical problems:  AKI History of coronary artery disease, hypertension, atrial flutter, Diastolic heart failure Acute metabolic encephalopathy Diabetes mellitus, type  II Anemia  DVT Prophylaxis  None, Comfort  Code Status: DNR  Family Communication: Sister at bedside  Disposition Plan: Admitted. Pending palliative care recommendations. Social work consulted for possible residential hospice.  Consultants PCCM Palliative Care  Procedures  Intubation/extubation  Antibiotics   Anti-infectives (From admission, onward)   Start     Dose/Rate Route Frequency Ordered Stop   11/08/17 0800  ceFEPIme (MAXIPIME) 1 g in sodium chloride 0.9 % 100 mL IVPB  Status:  Discontinued     1 g 200 mL/hr over 30 Minutes Intravenous Every 24 hours 11/07/17 1235 11/08/17 1051   11/04/17 2000  vancomycin (VANCOCIN) 1,500 mg in sodium chloride 0.9 % 500 mL IVPB  Status:  Discontinued     1,500 mg 250 mL/hr over 120 Minutes Intravenous Every 48 hours 11/02/17 2007 11/03/17 0946   11/04/17 1100  linezolid (ZYVOX) IVPB 600 mg  Status:  Discontinued     600 mg 300 mL/hr over 60 Minutes Intravenous Every 12 hours 11/04/17 1051 11/08/17 1051   11/03/17 0800  ceFEPIme (MAXIPIME) 1 g in sodium chloride 0.9 % 100 mL IVPB  Status:  Discontinued     1 g 200 mL/hr over 30 Minutes Intravenous Every 12 hours 11/02/17 1856 11/07/17 1235   11/02/17 1800  vancomycin (VANCOCIN) 1,500 mg in sodium chloride 0.9 % 500 mL IVPB     1,500 mg 250 mL/hr over 120 Minutes Intravenous  Once 11/02/17 1734 11/02/17 2203   11/02/17 1745  ceFEPIme (MAXIPIME) 2 g in sodium chloride 0.9 % 100 mL IVPB     2 g 200 mL/hr over 30 Minutes  Intravenous  Once 11/02/17 1738 11/02/17 2257   11/02/17 1715  aztreonam (AZACTAM) 2 g in sodium chloride 0.9 % 100 mL IVPB  Status:  Discontinued     2 g 200 mL/hr over 30 Minutes Intravenous  Once 11/02/17 1714 11/02/17 1844   11/02/17 1715  vancomycin (VANCOCIN) IVPB 1000 mg/200 mL premix     1,000 mg 200 mL/hr over 60 Minutes Intravenous  Once 11/02/17 1714 11/02/17 1927      Subjective:   Jamie Mason seen and examined today.  Currently comfortable.    Objective:   Vitals:   11/11/17 2200 11/12/17 0544 11/12/17 1954 11/13/17 2041  BP: (!) 102/53 (!) 121/58 131/60 (!) 119/53  Pulse:  69 66 (!) 59  Resp: 19 20 (!) 22 20  Temp: 98.5 F (36.9 C)  98.6 F (37 C) 99 F (37.2 C)  TempSrc: Oral  Oral Oral  SpO2:  98% 96% 93%  Weight:      Height:        Intake/Output Summary (Last 24 hours) at 11/14/2017 1213 Last data filed at 11/14/2017 1107 Gross per 24 hour  Intake 1142.57 ml  Output 550 ml  Net 592.57 ml   Filed Weights   11/07/17 0447 11/08/17 0500 11/09/17 0500  Weight: (!) 153.4 kg (338 lb 3 oz) (!) 156.3 kg (344 lb 9.3 oz) (!) 153.2 kg (337 lb 11.9 oz)   Exam  General: Well developed, chronically ill-appearing, obese, NAD  HEENT: NCAT, mucous membranes moist.   Neck: Supple  Cardiovascular: S1 S2 auscultated, no rubs, murmurs or gallops. Regular rate and rhythm.  Respiratory: Clear to auscultation; upper airway congestion/secretions  Abdomen: Soft, nontender, nondistended, + bowel sounds  Extremities: warm dry without cyanosis clubbing. +LE edema B/L   Data Reviewed: I have personally reviewed following labs and imaging studies  CBC: Recent Labs  Lab 11/08/17 0313 11/09/17 0717  WBC 9.6 13.4*  HGB 7.0* 7.3*  HCT 22.6* 23.5*  MCV 84.3 85.5  PLT 347 505   Basic Metabolic Panel: Recent Labs  Lab 11/08/17 0313  NA 144  K 4.2  CL 107  CO2 25  GLUCOSE 136*  BUN 69*  CREATININE 3.87*  CALCIUM 8.3*   GFR: Estimated Creatinine Clearance: 19.1 mL/min (A) (by C-G formula based on SCr of 3.87 mg/dL (H)). Liver Function Tests: Recent Labs  Lab 11/08/17 0313  AST 34  ALT 21  ALKPHOS 144*  BILITOT 0.4  PROT 7.1  ALBUMIN 2.4*   No results for input(s): LIPASE, AMYLASE in the last 168 hours. No results for input(s): AMMONIA in the last 168 hours. Coagulation Profile: No results for input(s): INR, PROTIME in the last 168 hours. Cardiac Enzymes: No results for input(s): CKTOTAL, CKMB, CKMBINDEX,  TROPONINI in the last 168 hours. BNP (last 3 results) No results for input(s): PROBNP in the last 8760 hours. HbA1C: No results for input(s): HGBA1C in the last 72 hours. CBG: Recent Labs  Lab 11/08/17 1622 11/08/17 2003 11/08/17 2347 11/09/17 0422 11/09/17 0746  GLUCAP 153* 146* 150* 160* 151*   Lipid Profile: No results for input(s): CHOL, HDL, LDLCALC, TRIG, CHOLHDL, LDLDIRECT in the last 72 hours. Thyroid Function Tests: No results for input(s): TSH, T4TOTAL, FREET4, T3FREE, THYROIDAB in the last 72 hours. Anemia Panel: No results for input(s): VITAMINB12, FOLATE, FERRITIN, TIBC, IRON, RETICCTPCT in the last 72 hours. Urine analysis:    Component Value Date/Time   COLORURINE AMBER (A) 09/14/2017 1658   APPEARANCEUR TURBID (A) 09/14/2017 1658  LABSPEC 1.014 09/14/2017 1658   PHURINE 5.0 09/14/2017 1658   GLUCOSEU NEGATIVE 09/14/2017 1658   HGBUR SMALL (A) 09/14/2017 1658   BILIRUBINUR NEGATIVE 09/14/2017 1658   BILIRUBINUR Negative 03/19/2014 1631   KETONESUR NEGATIVE 09/14/2017 1658   PROTEINUR 30 (A) 09/14/2017 1658   UROBILINOGEN 0.2 03/19/2014 1631   NITRITE NEGATIVE 09/14/2017 1658   LEUKOCYTESUR LARGE (A) 09/14/2017 1658   Sepsis Labs: _0 (procalcitonin:4,lacticidven:4)  )No results found for this or any previous visit (from the past 240 hour(s)).    Radiology Studies: No results found.   Scheduled Meds: . chlorhexidine gluconate (MEDLINE KIT)  15 mL Mouth Rinse BID  . glycopyrrolate  0.4 mg Intravenous Q4H  . LORazepam  1 mg Intravenous Q6H  . mouth rinse  15 mL Mouth Rinse QID  . metoCLOPramide (REGLAN) injection  10 mg Intravenous Q6H   Continuous Infusions: . sodium chloride 10 mL/hr at 11/10/17 0000  . HYDROmorphone 1 mg/hr (11/13/17 2328)     LOS: 12 days   Time Spent in minutes   30 minutes  Landy Mace D.O. on 11/14/2017 at 12:13 PM  Between 7am to 7pm - Pager - [phone]  After 7pm go to [email or web address] - password  [email or web address]  And look for the night coverage person covering for me after hours  [facility] Office  [phone]

## 2017-11-14 NOTE — Consult Note (Signed)
Consultation Note Date: 11/14/2017   Patient Name: Jamie Mason  DOB: February 27, 1941  MRN: [id number]  Age / Sex: 77 y.o., female  PCP: Bartholomew Crews, MD Referring Physician: Cristal Ford, DO  Reason for Consultation: Disposition, Non pain symptom management, Psychosocial/spiritual support and Terminal Care  HPI/Patient Profile: 77 y.o. female  with past medical history of chronic respiratory failure, a fib, HFpEF, HTN, DM  admitted on 11/02/2017 with respiratory failure related to PNA and pulmonary edema.  She was intubated and managed in ICU without improvement and was extubated with transition to comfort care.  She has had excessive secretions.  Currently on dilaudid continuous infusion.  Palliative consulted for symptom management and evaluation for possible transition to residential hospice.   Clinical Assessment and Goals of Care: I met today with patients sister and one daughter.  We discussed care plan over the last several days and reviewed plan for comfort care.  Main concern per family is continued secretions and coughing up white sputum.  They reports that she di become uncomfortable after fentanyl stopped, but this improved with starting dilaudid infusion.  Concept of residential hospice discussed  Questions and concerns addressed.   PMT will continue to support holistically.  SUMMARY OF RECOMMENDATIONS   - Full comfort care.  She has been having excessive secretions despite atropine SL.  Plan for addition of robinul 0.4 Q4H as well as scheduled Reglan (appearance of secretions makes me question if some refluxed stomach juices).   - Family will discuss regarding possible transition to [facility].  Code Status/Advance Care Planning:  DNR    Symptom Management:   Pain/SOB: dilaudid infusion  Axniety: Ativan  Secretions: Addition of robinul and reglan  Additional  Recommendations (Limitations, Scope, Preferences):  Full Comfort Care  Psycho-social/Spiritual:   Desire for further Chaplaincy support:no  Additional Recommendations: Education on Hospice  Prognosis:   Hours - Days  Discharge Planning: Hospice facility     Primary Diagnoses: Present on Admission: . Acute respiratory failure ([place])   I have reviewed the medical record, interviewed the patient and family, and examined the patient. The following aspects are pertinent.  Past Medical History:  Diagnosis Date  . Atrial flutter with rapid ventricular response ([place]) 07/11/2016  . CAD (coronary artery disease) 05/04/2011  . Diabetes mellitus without complication ([place])   . Diastolic heart failure ([place]) 06/2016   EF 60-65%, G2DD  . Hyperlipidemia   . Hypertension   . Lumbar disc disease   . Morbid Obesity 05/02/2011  . Non-ST elevation myocardial infarction (NSTEMI), initial episode of care [facility]) 05/02/2011   Social History   Socioeconomic History  . Marital status: Married    Spouse name: Not on file  . Number of children: Not on file  . Years of education: 65  . Highest education level: Not on file  Occupational History    Employer: UNEMPLOYED  Social Needs  . Financial resource strain: Not on file  . Food insecurity:    Worry: Not on file    Inability:  Not on file  . Transportation needs:    Medical: Not on file    Non-medical: Not on file  Tobacco Use  . Smoking status: Former Smoker    Packs/day: 0.50    Years: 50.00    Pack years: 25.00    Types: Cigarettes    Last attempt to quit: 04/01/2011    Years since quitting: 6.6  . Smokeless tobacco: Never Used  Substance and Sexual Activity  . Alcohol use: No  . Drug use: No  . Sexual activity: Never  Lifestyle  . Physical activity:    Days per week: Not on file    Minutes per session: Not on file  . Stress: Not on file  Relationships  . Social connections:    Talks on phone: Not on file    Gets  together: Not on file    Attends religious service: Not on file    Active member of club or organization: Not on file    Attends meetings of clubs or organizations: Not on file    Relationship status: Not on file  Other Topics Concern  . Not on file  Social History Narrative   June 2016 : married. Lives with husband (blind) and adult daughter. 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 Independent in ADL's. Doesn't go to stores / church 2/2 pain / trouble walking. Cold [place] tobacco about 2011.   Family History  Problem Relation Age of Onset  . Cancer Father   . Stroke Mother    Scheduled Meds: . chlorhexidine gluconate (MEDLINE KIT)  15 mL Mouth Rinse BID  . glycopyrrolate  0.4 mg Intravenous Q4H  . LORazepam  1 mg Intravenous Q6H  . mouth rinse  15 mL Mouth Rinse QID  . metoCLOPramide (REGLAN) injection  10 mg Intravenous Q6H   Continuous Infusions: . sodium chloride 10 mL/hr at 11/10/17 0000  . HYDROmorphone 1 mg/hr (11/13/17 2328)   PRN Meds:.albuterol, atropine Medications Prior to Admission:  Prior to Admission medications   Medication Sig Start Date End Date Taking? Authorizing Provider  acetaminophen (TYLENOL) 500 MG tablet Take 1,000 mg by mouth as needed for mild pain.   Yes [provider]  albuterol (VENTOLIN HFA) 108 (90 Base) MCG/ACT inhaler INHALE 2 PUFFS INTO THE LUNGS EVERY 6 HOURS AS NEEDED FOR WHEEZING OR SHORTNESS OF BREATH. 10/11/17  Yes Bartholomew Crews, MD  amiodarone (PACERONE) 200 MG tablet Take 1 tablet (200 mg total) by mouth daily. 12/22/16  Yes Bartholomew Crews, MD  apixaban (ELIQUIS) 5 MG TABS tablet Take 1 tablet (5 mg total) by mouth 2 (two) times daily. 09/06/16  Yes Bartholomew Crews, MD  aspirin (ASPIR-LOW) 81 MG EC tablet Take 81 mg by mouth daily. Swallow whole.   Yes [provider]  atorvastatin (LIPITOR) 40 MG tablet TAKE 1 TABLET EVERY DAY 10/02/17  Yes Bartholomew Crews, MD    carvedilol (COREG) 12.5 MG tablet Take 1 tablet (12.5 mg total) by mouth 2 (two) times daily with a meal. 09/22/17  Yes Kathi Ludwig, MD  diltiazem (CARDIZEM CD) 240 MG 24 hr capsule Take 1 capsule (240 mg total) by mouth daily. 05/11/17 11/02/17 Yes Bartholomew Crews, MD  furosemide (LASIX) 80 MG tablet Take 1 tablet (80 mg total) by mouth 2 (two) times daily. 09/22/17  Yes Kathi Ludwig, MD  hydrALAZINE (APRESOLINE) 50 MG tablet Take 1 tablet (50 mg total) by mouth 3 (three) times daily. 12/29/16  Yes Thomasene Ripple, MD  metFORMIN (GLUCOPHAGE) 500 MG tablet Take 1 tablet (500 mg total) by mouth 2 (two) times daily with a meal. 10/11/17  Yes Bartholomew Crews, MD  traMADol (ULTRAM) 50 MG tablet Take 1 tablet (50 mg total) by mouth every 6 (six) hours as needed (for pain). 11/02/17   Molpus, Jenny Reichmann, MD   Allergies  Allergen Reactions  . Lisinopril Swelling    Tongue swelling   . Ace Inhibitors Other (See Comments)    unknown  . Codeine Itching and Rash  . Penicillins Itching and Rash    Has patient had a PCN reaction causing immediate rash, facial/tongue/throat swelling, SOB or lightheadedness with hypotension: unknown Has patient had a PCN reaction causing severe rash involving mucus membranes or skin necrosis: unknown Has patient had a PCN reaction that required hospitalization: no Has patient had a PCN reaction occurring within the last 10 years: no If all of the above answers are "NO", then may proceed with Cephalosporin use. TOLERATES CEPHALOSPORINS  . Tomato     Itchy rash   Review of Systems  Unable to complete as patient is non-responsive  Physical Exam  General: Somnolent Heart: regular. No murmur appreciated. Lungs: Diminished air movement, Upper airway congestion Abdomen: Soft, nontender, nondistended, positive bowel sounds.  Skin: Warm and dry  Vital Signs: BP (!) 119/53 (BP Location: Left Arm) Comment: RN Notified  Pulse (!) 59 Comment: RN Notified   Temp 99 F (37.2 C) (Oral)   Resp 20   Ht 5' 5.5" (1.664 m)   Wt (!) 153.2 kg (337 lb 11.9 oz)   SpO2 93%   BMI 55.35 kg/m  Pain Scale: PAINAD POSS *See Group Information*: S-Acceptable,Sleep, easy to arouse Pain Score: Asleep   SpO2: SpO2: 93 % O2 Device:SpO2: 93 % O2 Flow Rate: .O2 Flow Rate (L/min): 2 L/min  IO: Intake/output summary:   Intake/Output Summary (Last 24 hours) at 11/14/2017 1301 Last data filed at 11/14/2017 1107 Gross per 24 hour  Intake 1142.57 ml  Output 550 ml  Net 592.57 ml    LBM: Last BM Date: (unknown) Baseline Weight: Weight: 136.1 kg (300 lb) Most recent weight: Weight: (!) 153.2 kg (337 lb 11.9 oz)     Palliative Assessment/Data:   Flowsheet Rows     Most Recent Value  Intake Tab  Referral Department  Hospitalist  Unit at Time of Referral  Med/Surg Unit  Palliative Care Primary Diagnosis  Pulmonary  Date Notified  11/14/17  Palliative Care Type  New Palliative care  Reason for referral  Non-pain Symptom, Counsel Regarding Hospice, End of Life Care Assistance  Date of Admission  11/02/17  Date first seen by Palliative Care  11/14/17  # of days Palliative referral response time  0 Day(s)  # of days IP prior to Palliative referral  12  Clinical Assessment  Palliative Performance Scale Score  10%  Pain Max last 24 hours  Not able to report  Pain Min Last 24 hours  Not able to report  Psychosocial & Spiritual Assessment  Palliative Care Outcomes  Patient/Family meeting held?  Yes  Who was at the meeting?  Sister, daughter  Palliative Care Outcomes  Improved non-pain symptom therapy, Counseled regarding hospice     Time Total: 60 Greater than 50%  of this time was spent counseling and coordinating care related to the above assessment and plan.  Signed by: Micheline Rough, MD   Please contact Palliative Medicine Team phone at [phone] for questions and  concerns.  For individual provider: See Shea Evans

## 2017-11-14 NOTE — Progress Notes (Signed)
Report called [facility]resa[facility]

## 2017-11-14 NOTE — Consult Note (Signed)
   [facility]   11/14/2017  Arvella NighGloria C Gillson 09/22/1940 [id number]    [facility].   Chart reviewed. Noted [name] is slated [facility].  Raiford NobleAtika Hall, MSN-Ed, RN,BSN [facility] Care Management Hospital Liaison [phone]

## 2017-11-15 NOTE — Progress Notes (Signed)
Pt Dilaudid 25 mg in NS 50 mL (0.5 mg/mL) infusion not found in global search in pyxis to waste, 12.5 mg/25 mL wasted in sink witnessed by Standley BrookingHeidi Richard, RN.

## 2017-11-22 DIAGNOSIS — I509 Heart failure, unspecified: Secondary | ICD-10-CM | POA: Diagnosis not present

## 2017-11-22 DIAGNOSIS — N183 Chronic kidney disease, stage 3 (moderate): Secondary | ICD-10-CM | POA: Diagnosis not present

## 2017-12-11 DEATH — deceased

## 2018-02-07 IMAGING — DX DG CHEST 1V PORT
1 series · 1 of 1 positions shown · non-contrast
Comparison: 09/12/2016

CLINICAL DATA: Shortness of breath

EXAM:
PORTABLE CHEST 1 VIEW

[chest ap]
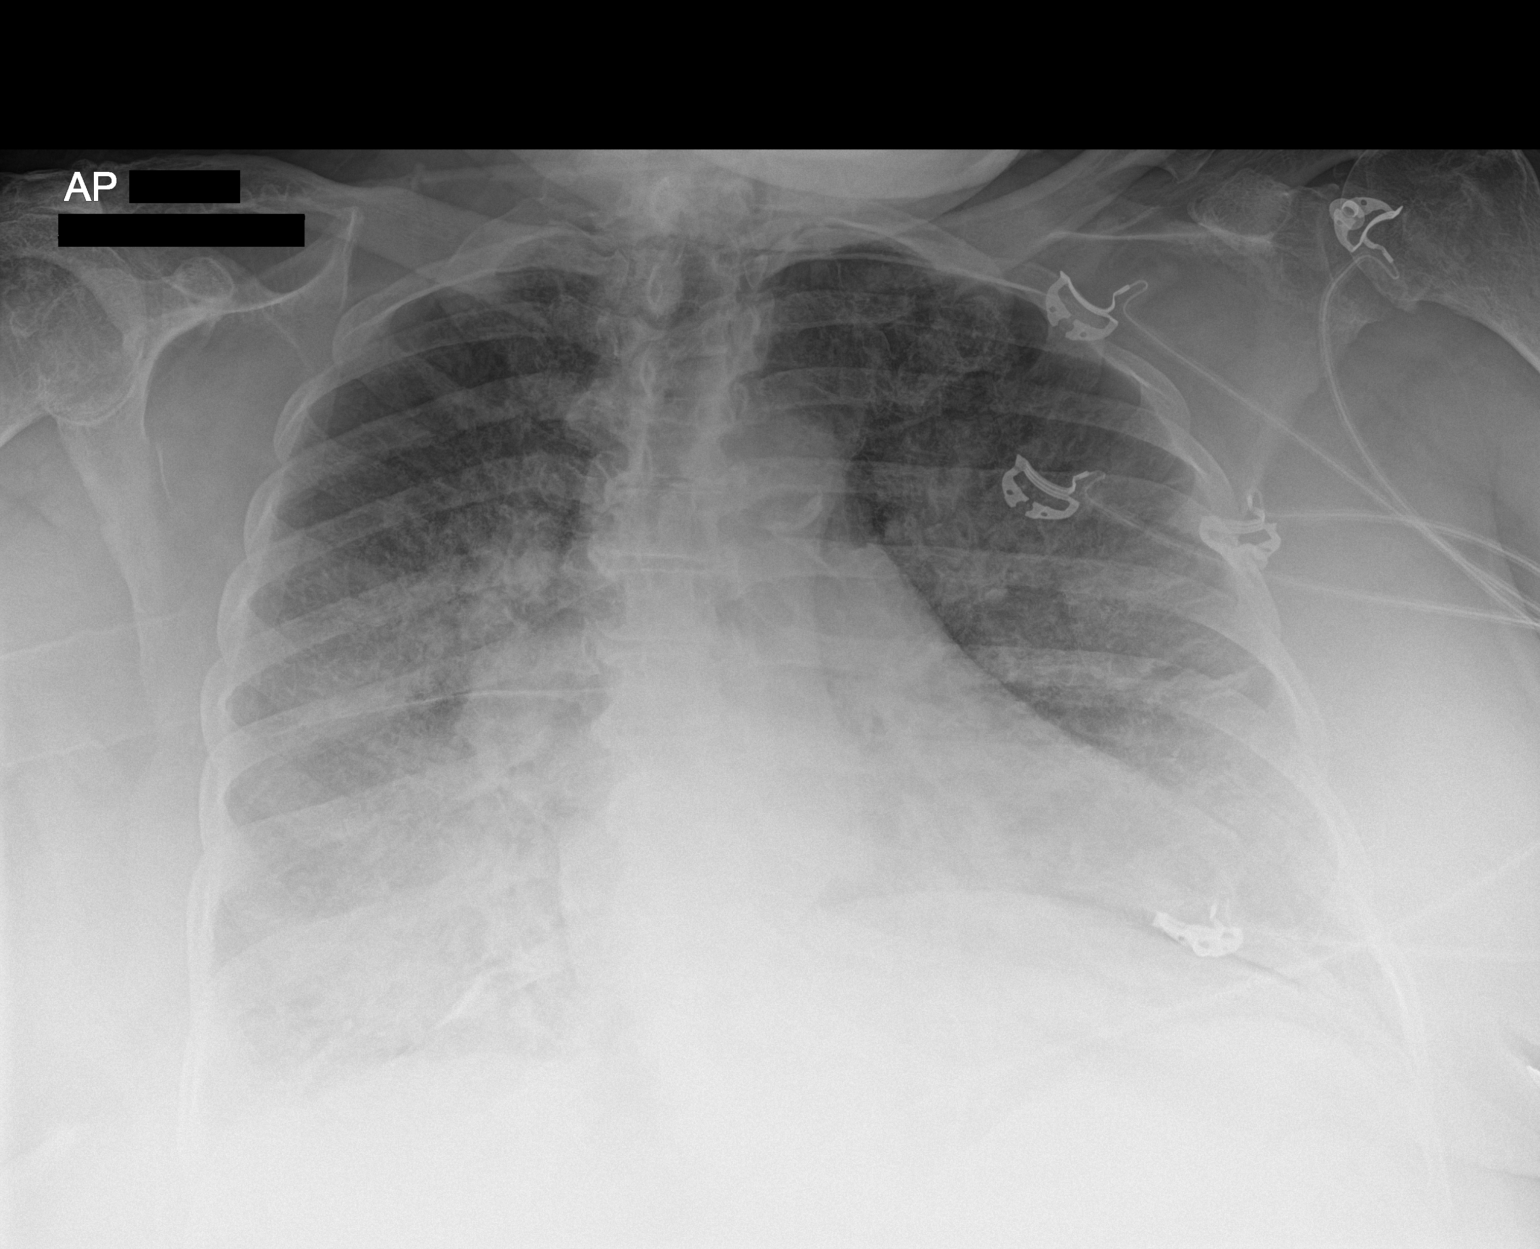

[1 of 1 positions shown; findings below may reference images not displayed]

FINDINGS: Cardiomegaly and chronic fullness of the hilar vessels. Diffuse
interstitial and airspace opacity, symmetric. No definitive pleural
effusion. No pneumothorax.
IMPRESSION: CHF pattern

## 2019-02-13 IMAGING — DX DG CHEST 1V PORT
1 series · 1 of 1 positions shown · non-contrast
Comparison: 11/02/2017

CLINICAL DATA: Respiratory failure, intubated.

EXAM:
PORTABLE CHEST 1 VIEW

[chest ap]
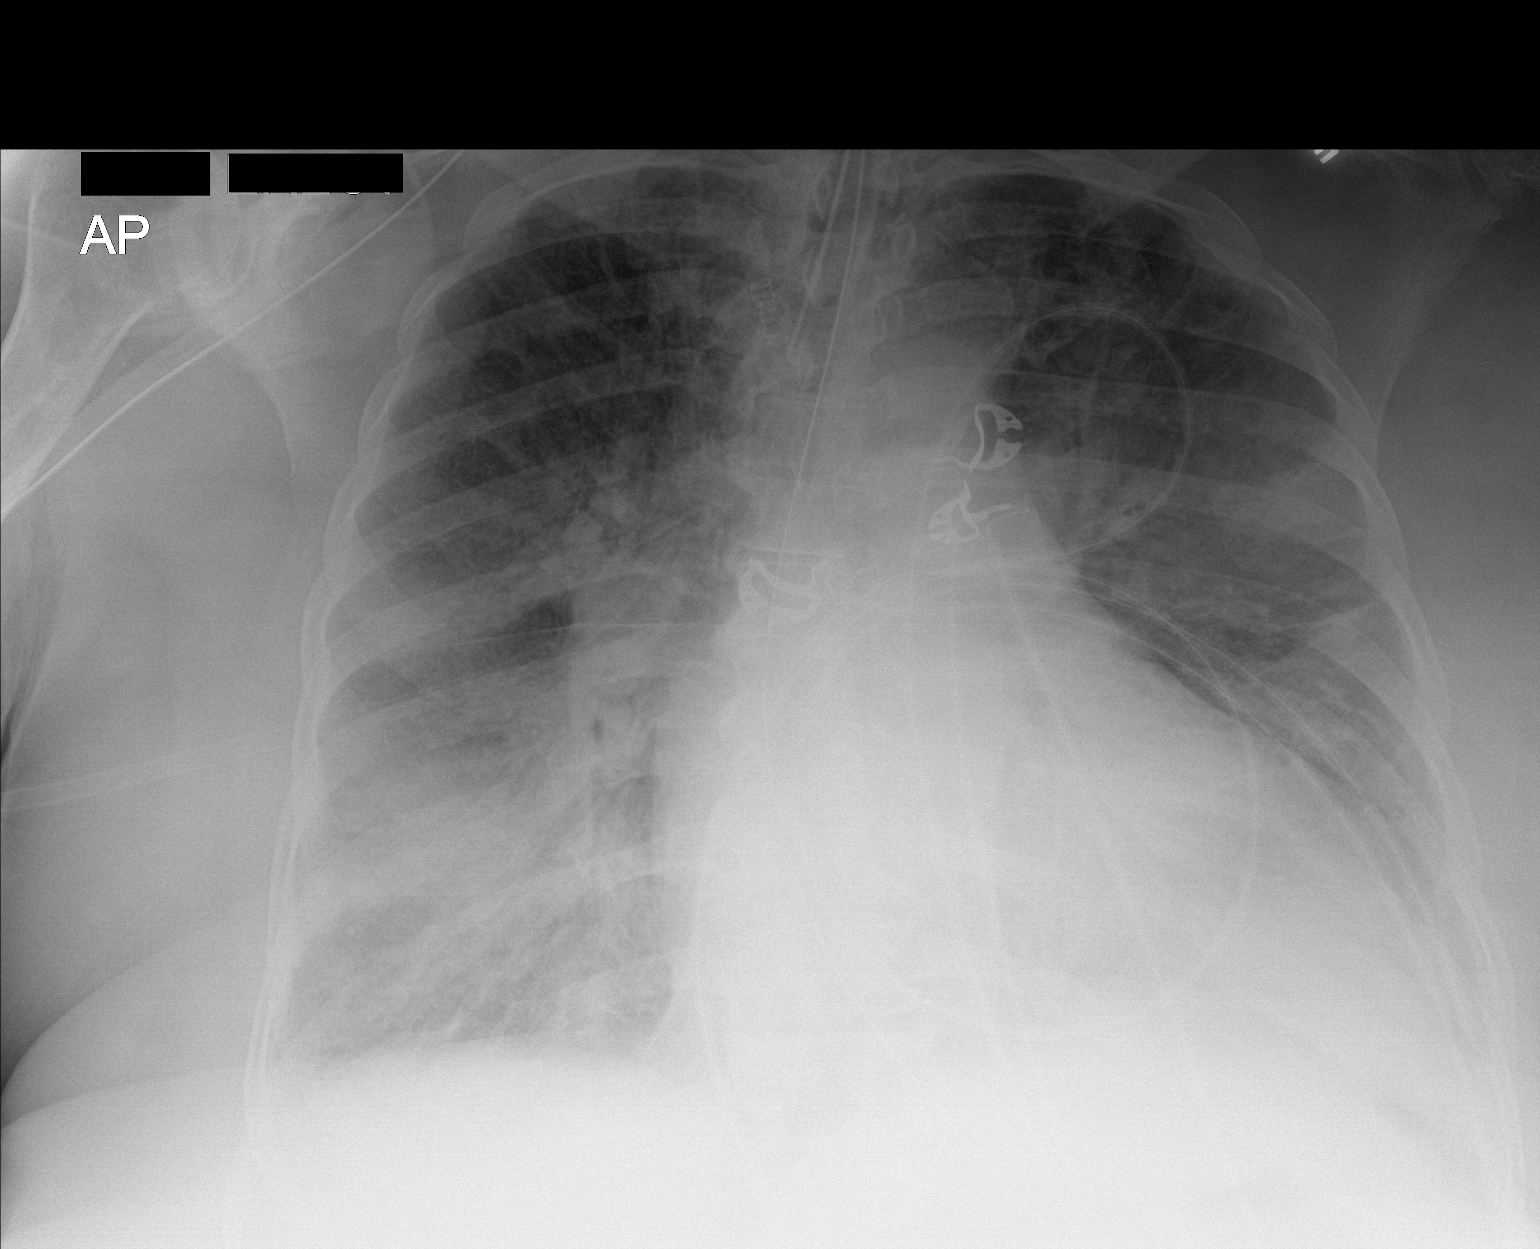

[1 of 1 positions shown; findings below may reference images not displayed]

FINDINGS: Stable cardiomegaly with aortic atherosclerosis. New endotracheal
tube terminates 3.2 cm above the carina in satisfactory position. A
gastric tube extends below the left hemidiaphragm. The tip and side
port are excluded on this study. Diffuse airspace opacities
consistent with pulmonary edema are redemonstrated slightly more
confluent in the left upper and right lower lobes. Probable small
bilateral pleural effusions. No pneumothorax. No acute osseous
abnormality.
IMPRESSION: 1. Satisfactory endotracheal tube position approximately 3.2 cm
above the carina.
2. Gastric tube extends below the left hemidiaphragm. The tip and
side port are excluded on this study.
3. Stable cardiomegaly with diffuse bilateral airspace disease
consistent with pulmonary edema. Superimposed pneumonia would be
difficult to entirely exclude bilaterally.

## 2019-02-14 IMAGING — DX DG CHEST 1V PORT
1 series · 1 of 1 positions shown · non-contrast
Comparison: 11/02/2017

CLINICAL DATA: Respiratory distress

EXAM:
PORTABLE CHEST 1 VIEW

[chest ap]
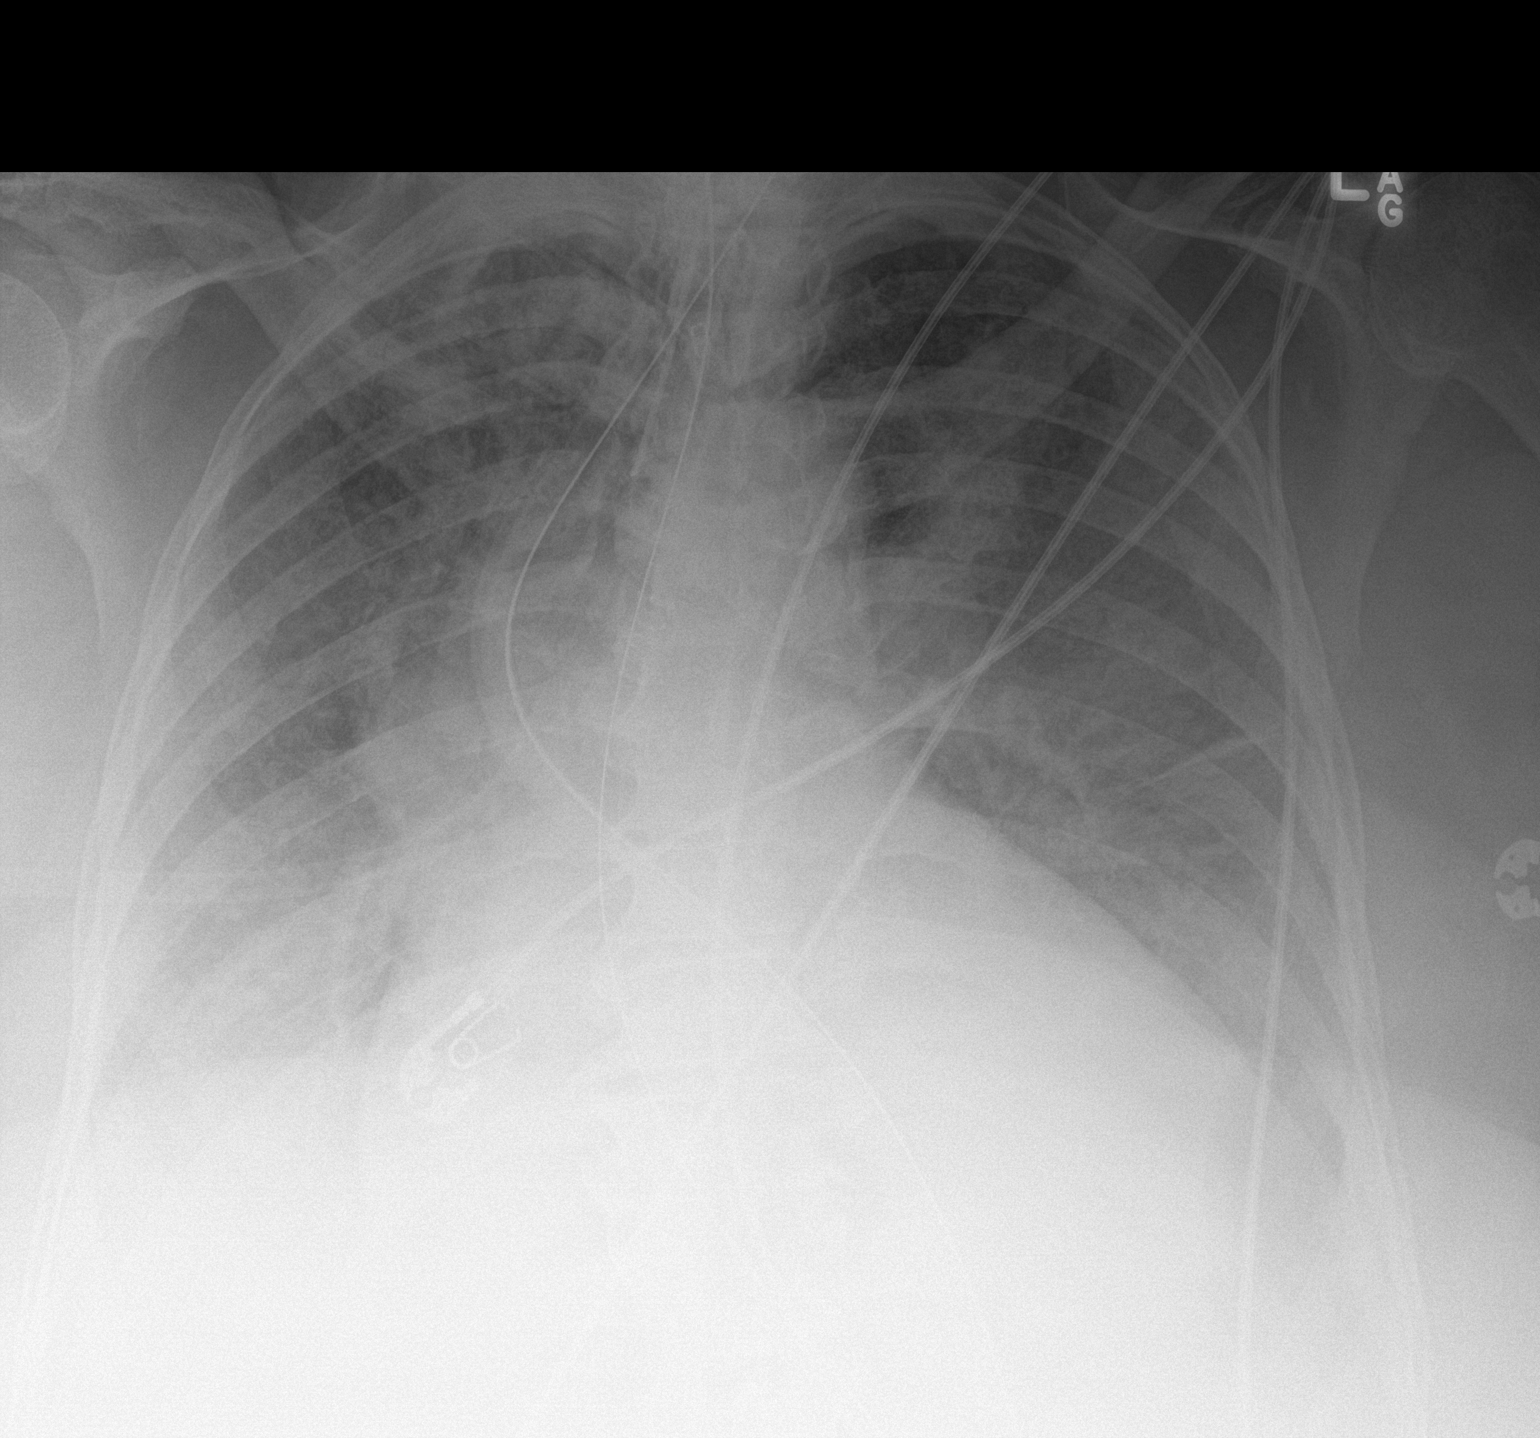

[1 of 1 positions shown; findings below may reference images not displayed]

FINDINGS: Endotracheal tube and nasogastric catheter are again seen and
stable. Mild cardiomegaly is noted accentuated by the portable
technique. Vascular congestion and edema are again identified and
stable. No new focal abnormality is seen. No bony abnormality is
noted.
IMPRESSION: Stable vascular congestion and pulmonary edema.
